# Patient Record
Sex: Female | Born: 1935 | Race: White | Hispanic: No | Smoking: Former smoker
Health system: Southern US, Community
[De-identification: ages and names within clinical notes are randomized; demographics above are authoritative.]

## PROBLEM LIST (undated history)

## (undated) DIAGNOSIS — G629 Polyneuropathy, unspecified: Secondary | ICD-10-CM

## (undated) DIAGNOSIS — C801 Malignant (primary) neoplasm, unspecified: Secondary | ICD-10-CM

## (undated) DIAGNOSIS — E785 Hyperlipidemia, unspecified: Secondary | ICD-10-CM

## (undated) DIAGNOSIS — Z923 Personal history of irradiation: Secondary | ICD-10-CM

## (undated) DIAGNOSIS — G4733 Obstructive sleep apnea (adult) (pediatric): Secondary | ICD-10-CM

## (undated) DIAGNOSIS — J439 Emphysema, unspecified: Secondary | ICD-10-CM

## (undated) DIAGNOSIS — M810 Age-related osteoporosis without current pathological fracture: Secondary | ICD-10-CM

## (undated) DIAGNOSIS — K5900 Constipation, unspecified: Secondary | ICD-10-CM

## (undated) DIAGNOSIS — M1990 Unspecified osteoarthritis, unspecified site: Secondary | ICD-10-CM

## (undated) DIAGNOSIS — G4730 Sleep apnea, unspecified: Secondary | ICD-10-CM

## (undated) DIAGNOSIS — G2581 Restless legs syndrome: Secondary | ICD-10-CM

## (undated) DIAGNOSIS — I4891 Unspecified atrial fibrillation: Secondary | ICD-10-CM

## (undated) DIAGNOSIS — J449 Chronic obstructive pulmonary disease, unspecified: Secondary | ICD-10-CM

## (undated) DIAGNOSIS — K219 Gastro-esophageal reflux disease without esophagitis: Secondary | ICD-10-CM

## (undated) DIAGNOSIS — T7840XA Allergy, unspecified, initial encounter: Secondary | ICD-10-CM

## (undated) DIAGNOSIS — I499 Cardiac arrhythmia, unspecified: Secondary | ICD-10-CM

## (undated) DIAGNOSIS — I272 Pulmonary hypertension, unspecified: Secondary | ICD-10-CM

## (undated) DIAGNOSIS — I10 Essential (primary) hypertension: Secondary | ICD-10-CM

## (undated) HISTORY — DX: Allergy, unspecified, initial encounter: T78.40XA

## (undated) HISTORY — DX: Gastro-esophageal reflux disease without esophagitis: K21.9

## (undated) HISTORY — DX: Hyperlipidemia, unspecified: E78.5

## (undated) HISTORY — DX: Chronic obstructive pulmonary disease, unspecified: J44.9

## (undated) HISTORY — DX: Constipation, unspecified: K59.00

## (undated) HISTORY — DX: Malignant (primary) neoplasm, unspecified: C80.1

## (undated) HISTORY — PX: OTHER SURGICAL HISTORY: SHX169

## (undated) HISTORY — DX: Emphysema, unspecified: J43.9

## (undated) HISTORY — DX: Pulmonary hypertension, unspecified: I27.20

## (undated) HISTORY — DX: Sleep apnea, unspecified: G47.30

## (undated) HISTORY — PX: ABDOMINAL HYSTERECTOMY: SHX81

## (undated) HISTORY — DX: Unspecified atrial fibrillation: I48.91

## (undated) HISTORY — PX: TONSILLECTOMY: SHX5217

## (undated) HISTORY — DX: Restless legs syndrome: G25.81

## (undated) HISTORY — DX: Obstructive sleep apnea (adult) (pediatric): G47.33

## (undated) HISTORY — DX: Polyneuropathy, unspecified: G62.9

## (undated) HISTORY — PX: CATARACT EXTRACTION: SUR2

## (undated) HISTORY — DX: Essential (primary) hypertension: I10

## (undated) HISTORY — DX: Age-related osteoporosis without current pathological fracture: M81.0

## (undated) HISTORY — DX: Cardiac arrhythmia, unspecified: I49.9

## (undated) HISTORY — PX: KNEE ARTHROSCOPY: SUR90

## (undated) HISTORY — PX: COLONOSCOPY: SHX174

## (undated) HISTORY — DX: Unspecified osteoarthritis, unspecified site: M19.90

---

## 2009-02-19 ENCOUNTER — Encounter: Admission: RE | Admit: 2009-02-19 | Discharge: 2009-02-19 | Payer: Self-pay | Admitting: Family Medicine

## 2010-01-29 ENCOUNTER — Encounter: Admission: RE | Admit: 2010-01-29 | Discharge: 2010-01-29 | Payer: Self-pay | Admitting: Emergency Medicine

## 2010-01-31 ENCOUNTER — Encounter: Admission: RE | Admit: 2010-01-31 | Discharge: 2010-01-31 | Payer: Self-pay | Admitting: Emergency Medicine

## 2010-11-24 ENCOUNTER — Encounter: Payer: Self-pay | Admitting: Family Medicine

## 2015-11-13 DIAGNOSIS — R7309 Other abnormal glucose: Secondary | ICD-10-CM | POA: Diagnosis not present

## 2015-11-13 DIAGNOSIS — M545 Low back pain: Secondary | ICD-10-CM | POA: Diagnosis not present

## 2015-11-13 DIAGNOSIS — I10 Essential (primary) hypertension: Secondary | ICD-10-CM | POA: Diagnosis not present

## 2015-11-13 DIAGNOSIS — J449 Chronic obstructive pulmonary disease, unspecified: Secondary | ICD-10-CM | POA: Diagnosis not present

## 2015-11-13 DIAGNOSIS — R0789 Other chest pain: Secondary | ICD-10-CM | POA: Diagnosis not present

## 2015-11-23 DIAGNOSIS — R9431 Abnormal electrocardiogram [ECG] [EKG]: Secondary | ICD-10-CM | POA: Diagnosis not present

## 2015-11-23 DIAGNOSIS — R079 Chest pain, unspecified: Secondary | ICD-10-CM | POA: Diagnosis not present

## 2015-11-26 DIAGNOSIS — I4891 Unspecified atrial fibrillation: Secondary | ICD-10-CM | POA: Diagnosis not present

## 2015-11-26 DIAGNOSIS — Z888 Allergy status to other drugs, medicaments and biological substances status: Secondary | ICD-10-CM | POA: Diagnosis not present

## 2015-11-26 DIAGNOSIS — I480 Paroxysmal atrial fibrillation: Secondary | ICD-10-CM | POA: Diagnosis not present

## 2015-11-26 DIAGNOSIS — J439 Emphysema, unspecified: Secondary | ICD-10-CM | POA: Diagnosis not present

## 2015-11-26 DIAGNOSIS — I209 Angina pectoris, unspecified: Secondary | ICD-10-CM | POA: Diagnosis not present

## 2015-11-26 DIAGNOSIS — R51 Headache: Secondary | ICD-10-CM | POA: Diagnosis not present

## 2015-11-26 DIAGNOSIS — E782 Mixed hyperlipidemia: Secondary | ICD-10-CM | POA: Diagnosis not present

## 2015-11-26 DIAGNOSIS — E871 Hypo-osmolality and hyponatremia: Secondary | ICD-10-CM | POA: Diagnosis not present

## 2015-11-26 DIAGNOSIS — Z79899 Other long term (current) drug therapy: Secondary | ICD-10-CM | POA: Diagnosis not present

## 2015-11-26 DIAGNOSIS — J449 Chronic obstructive pulmonary disease, unspecified: Secondary | ICD-10-CM | POA: Diagnosis not present

## 2015-11-26 DIAGNOSIS — G459 Transient cerebral ischemic attack, unspecified: Secondary | ICD-10-CM | POA: Diagnosis not present

## 2015-11-26 DIAGNOSIS — E785 Hyperlipidemia, unspecified: Secondary | ICD-10-CM | POA: Diagnosis not present

## 2015-11-26 DIAGNOSIS — I10 Essential (primary) hypertension: Secondary | ICD-10-CM | POA: Diagnosis not present

## 2015-11-26 DIAGNOSIS — Z7901 Long term (current) use of anticoagulants: Secondary | ICD-10-CM | POA: Diagnosis not present

## 2015-11-26 DIAGNOSIS — R079 Chest pain, unspecified: Secondary | ICD-10-CM | POA: Diagnosis not present

## 2015-11-27 DIAGNOSIS — G459 Transient cerebral ischemic attack, unspecified: Secondary | ICD-10-CM | POA: Diagnosis not present

## 2015-11-30 DIAGNOSIS — I10 Essential (primary) hypertension: Secondary | ICD-10-CM | POA: Diagnosis not present

## 2015-11-30 DIAGNOSIS — J449 Chronic obstructive pulmonary disease, unspecified: Secondary | ICD-10-CM | POA: Diagnosis not present

## 2015-11-30 DIAGNOSIS — E871 Hypo-osmolality and hyponatremia: Secondary | ICD-10-CM | POA: Diagnosis not present

## 2015-12-24 DIAGNOSIS — J439 Emphysema, unspecified: Secondary | ICD-10-CM | POA: Diagnosis not present

## 2015-12-24 DIAGNOSIS — E782 Mixed hyperlipidemia: Secondary | ICD-10-CM | POA: Diagnosis not present

## 2015-12-24 DIAGNOSIS — I10 Essential (primary) hypertension: Secondary | ICD-10-CM | POA: Diagnosis not present

## 2015-12-24 DIAGNOSIS — I480 Paroxysmal atrial fibrillation: Secondary | ICD-10-CM | POA: Diagnosis not present

## 2015-12-25 DIAGNOSIS — M545 Low back pain: Secondary | ICD-10-CM | POA: Diagnosis not present

## 2015-12-25 DIAGNOSIS — R5383 Other fatigue: Secondary | ICD-10-CM | POA: Diagnosis not present

## 2015-12-25 DIAGNOSIS — J449 Chronic obstructive pulmonary disease, unspecified: Secondary | ICD-10-CM | POA: Diagnosis not present

## 2015-12-25 DIAGNOSIS — I10 Essential (primary) hypertension: Secondary | ICD-10-CM | POA: Diagnosis not present

## 2016-03-11 DIAGNOSIS — J209 Acute bronchitis, unspecified: Secondary | ICD-10-CM | POA: Diagnosis not present

## 2016-03-17 DIAGNOSIS — J449 Chronic obstructive pulmonary disease, unspecified: Secondary | ICD-10-CM | POA: Diagnosis not present

## 2016-03-17 DIAGNOSIS — I10 Essential (primary) hypertension: Secondary | ICD-10-CM | POA: Diagnosis not present

## 2016-04-03 DIAGNOSIS — I10 Essential (primary) hypertension: Secondary | ICD-10-CM | POA: Diagnosis not present

## 2016-04-03 DIAGNOSIS — J449 Chronic obstructive pulmonary disease, unspecified: Secondary | ICD-10-CM | POA: Diagnosis not present

## 2016-04-03 DIAGNOSIS — Z0000 Encounter for general adult medical examination without abnormal findings: Secondary | ICD-10-CM | POA: Diagnosis not present

## 2016-04-03 DIAGNOSIS — R5383 Other fatigue: Secondary | ICD-10-CM | POA: Diagnosis not present

## 2016-04-03 DIAGNOSIS — E782 Mixed hyperlipidemia: Secondary | ICD-10-CM | POA: Diagnosis not present

## 2016-04-03 DIAGNOSIS — M810 Age-related osteoporosis without current pathological fracture: Secondary | ICD-10-CM | POA: Diagnosis not present

## 2016-04-07 DIAGNOSIS — M545 Low back pain: Secondary | ICD-10-CM | POA: Diagnosis not present

## 2016-04-07 DIAGNOSIS — M8580 Other specified disorders of bone density and structure, unspecified site: Secondary | ICD-10-CM | POA: Diagnosis not present

## 2016-04-07 DIAGNOSIS — Z0000 Encounter for general adult medical examination without abnormal findings: Secondary | ICD-10-CM | POA: Diagnosis not present

## 2016-04-07 DIAGNOSIS — J449 Chronic obstructive pulmonary disease, unspecified: Secondary | ICD-10-CM | POA: Diagnosis not present

## 2016-04-07 DIAGNOSIS — E7800 Pure hypercholesterolemia, unspecified: Secondary | ICD-10-CM | POA: Diagnosis not present

## 2016-04-07 DIAGNOSIS — H6120 Impacted cerumen, unspecified ear: Secondary | ICD-10-CM | POA: Diagnosis not present

## 2016-04-07 DIAGNOSIS — I10 Essential (primary) hypertension: Secondary | ICD-10-CM | POA: Diagnosis not present

## 2016-04-21 DIAGNOSIS — M85851 Other specified disorders of bone density and structure, right thigh: Secondary | ICD-10-CM | POA: Diagnosis not present

## 2016-04-21 DIAGNOSIS — M8580 Other specified disorders of bone density and structure, unspecified site: Secondary | ICD-10-CM | POA: Diagnosis not present

## 2016-04-21 DIAGNOSIS — Z1231 Encounter for screening mammogram for malignant neoplasm of breast: Secondary | ICD-10-CM | POA: Diagnosis not present

## 2016-04-21 DIAGNOSIS — Z13820 Encounter for screening for osteoporosis: Secondary | ICD-10-CM | POA: Diagnosis not present

## 2016-04-21 DIAGNOSIS — M8588 Other specified disorders of bone density and structure, other site: Secondary | ICD-10-CM | POA: Diagnosis not present

## 2016-05-01 DIAGNOSIS — I10 Essential (primary) hypertension: Secondary | ICD-10-CM | POA: Diagnosis not present

## 2016-05-01 DIAGNOSIS — M542 Cervicalgia: Secondary | ICD-10-CM | POA: Diagnosis not present

## 2016-05-01 DIAGNOSIS — R0789 Other chest pain: Secondary | ICD-10-CM | POA: Diagnosis not present

## 2016-06-02 DIAGNOSIS — R002 Palpitations: Secondary | ICD-10-CM | POA: Diagnosis not present

## 2016-06-02 DIAGNOSIS — I10 Essential (primary) hypertension: Secondary | ICD-10-CM | POA: Diagnosis not present

## 2016-06-09 DIAGNOSIS — I480 Paroxysmal atrial fibrillation: Secondary | ICD-10-CM | POA: Diagnosis not present

## 2016-06-09 DIAGNOSIS — E782 Mixed hyperlipidemia: Secondary | ICD-10-CM | POA: Diagnosis not present

## 2016-06-09 DIAGNOSIS — I10 Essential (primary) hypertension: Secondary | ICD-10-CM | POA: Diagnosis not present

## 2016-06-09 DIAGNOSIS — Q833 Accessory nipple: Secondary | ICD-10-CM | POA: Diagnosis not present

## 2016-06-09 DIAGNOSIS — I4891 Unspecified atrial fibrillation: Secondary | ICD-10-CM | POA: Diagnosis not present

## 2016-06-09 DIAGNOSIS — L720 Epidermal cyst: Secondary | ICD-10-CM | POA: Diagnosis not present

## 2016-06-09 DIAGNOSIS — D234 Other benign neoplasm of skin of scalp and neck: Secondary | ICD-10-CM | POA: Diagnosis not present

## 2016-06-09 DIAGNOSIS — J449 Chronic obstructive pulmonary disease, unspecified: Secondary | ICD-10-CM | POA: Diagnosis not present

## 2016-06-09 DIAGNOSIS — L820 Inflamed seborrheic keratosis: Secondary | ICD-10-CM | POA: Diagnosis not present

## 2016-07-26 DIAGNOSIS — Z23 Encounter for immunization: Secondary | ICD-10-CM | POA: Diagnosis not present

## 2016-08-18 DIAGNOSIS — I10 Essential (primary) hypertension: Secondary | ICD-10-CM | POA: Diagnosis not present

## 2016-08-18 DIAGNOSIS — R002 Palpitations: Secondary | ICD-10-CM | POA: Diagnosis not present

## 2016-08-18 DIAGNOSIS — L659 Nonscarring hair loss, unspecified: Secondary | ICD-10-CM | POA: Diagnosis not present

## 2016-08-18 DIAGNOSIS — J449 Chronic obstructive pulmonary disease, unspecified: Secondary | ICD-10-CM | POA: Diagnosis not present

## 2016-08-18 DIAGNOSIS — G4700 Insomnia, unspecified: Secondary | ICD-10-CM | POA: Diagnosis not present

## 2016-09-04 DIAGNOSIS — I10 Essential (primary) hypertension: Secondary | ICD-10-CM | POA: Diagnosis not present

## 2016-09-04 DIAGNOSIS — Z8679 Personal history of other diseases of the circulatory system: Secondary | ICD-10-CM | POA: Diagnosis not present

## 2016-09-04 DIAGNOSIS — R002 Palpitations: Secondary | ICD-10-CM | POA: Diagnosis not present

## 2016-09-11 DIAGNOSIS — M545 Low back pain: Secondary | ICD-10-CM | POA: Diagnosis not present

## 2016-09-11 DIAGNOSIS — J449 Chronic obstructive pulmonary disease, unspecified: Secondary | ICD-10-CM | POA: Diagnosis not present

## 2016-09-11 DIAGNOSIS — R002 Palpitations: Secondary | ICD-10-CM | POA: Diagnosis not present

## 2016-09-11 DIAGNOSIS — G4700 Insomnia, unspecified: Secondary | ICD-10-CM | POA: Diagnosis not present

## 2016-09-11 DIAGNOSIS — R05 Cough: Secondary | ICD-10-CM | POA: Diagnosis not present

## 2016-09-11 DIAGNOSIS — I10 Essential (primary) hypertension: Secondary | ICD-10-CM | POA: Diagnosis not present

## 2016-09-16 DIAGNOSIS — H43813 Vitreous degeneration, bilateral: Secondary | ICD-10-CM | POA: Diagnosis not present

## 2016-09-16 DIAGNOSIS — H52223 Regular astigmatism, bilateral: Secondary | ICD-10-CM | POA: Diagnosis not present

## 2016-09-16 DIAGNOSIS — Z961 Presence of intraocular lens: Secondary | ICD-10-CM | POA: Diagnosis not present

## 2016-09-16 DIAGNOSIS — G43109 Migraine with aura, not intractable, without status migrainosus: Secondary | ICD-10-CM | POA: Diagnosis not present

## 2016-10-03 DIAGNOSIS — M545 Low back pain: Secondary | ICD-10-CM | POA: Diagnosis not present

## 2016-10-03 DIAGNOSIS — R002 Palpitations: Secondary | ICD-10-CM | POA: Diagnosis not present

## 2016-10-03 DIAGNOSIS — I10 Essential (primary) hypertension: Secondary | ICD-10-CM | POA: Diagnosis not present

## 2016-10-03 DIAGNOSIS — J449 Chronic obstructive pulmonary disease, unspecified: Secondary | ICD-10-CM | POA: Diagnosis not present

## 2016-11-10 DIAGNOSIS — R002 Palpitations: Secondary | ICD-10-CM | POA: Diagnosis not present

## 2016-11-10 DIAGNOSIS — J449 Chronic obstructive pulmonary disease, unspecified: Secondary | ICD-10-CM | POA: Diagnosis not present

## 2016-11-10 DIAGNOSIS — R7309 Other abnormal glucose: Secondary | ICD-10-CM | POA: Diagnosis not present

## 2016-11-10 DIAGNOSIS — I10 Essential (primary) hypertension: Secondary | ICD-10-CM | POA: Diagnosis not present

## 2016-12-08 DIAGNOSIS — J439 Emphysema, unspecified: Secondary | ICD-10-CM | POA: Diagnosis not present

## 2016-12-08 DIAGNOSIS — I480 Paroxysmal atrial fibrillation: Secondary | ICD-10-CM | POA: Diagnosis not present

## 2016-12-08 DIAGNOSIS — E782 Mixed hyperlipidemia: Secondary | ICD-10-CM | POA: Diagnosis not present

## 2016-12-08 DIAGNOSIS — I10 Essential (primary) hypertension: Secondary | ICD-10-CM | POA: Diagnosis not present

## 2016-12-26 DIAGNOSIS — H903 Sensorineural hearing loss, bilateral: Secondary | ICD-10-CM | POA: Diagnosis not present

## 2016-12-26 DIAGNOSIS — Z6822 Body mass index (BMI) 22.0-22.9, adult: Secondary | ICD-10-CM | POA: Diagnosis not present

## 2017-01-19 DIAGNOSIS — R002 Palpitations: Secondary | ICD-10-CM | POA: Diagnosis not present

## 2017-01-19 DIAGNOSIS — R1013 Epigastric pain: Secondary | ICD-10-CM | POA: Diagnosis not present

## 2017-01-19 DIAGNOSIS — I10 Essential (primary) hypertension: Secondary | ICD-10-CM | POA: Diagnosis not present

## 2017-01-19 DIAGNOSIS — K5900 Constipation, unspecified: Secondary | ICD-10-CM | POA: Diagnosis not present

## 2017-02-05 DIAGNOSIS — K5900 Constipation, unspecified: Secondary | ICD-10-CM | POA: Diagnosis not present

## 2017-02-05 DIAGNOSIS — K458 Other specified abdominal hernia without obstruction or gangrene: Secondary | ICD-10-CM | POA: Diagnosis not present

## 2017-02-05 DIAGNOSIS — I10 Essential (primary) hypertension: Secondary | ICD-10-CM | POA: Diagnosis not present

## 2017-02-11 DIAGNOSIS — K439 Ventral hernia without obstruction or gangrene: Secondary | ICD-10-CM | POA: Diagnosis not present

## 2017-02-11 DIAGNOSIS — I480 Paroxysmal atrial fibrillation: Secondary | ICD-10-CM | POA: Diagnosis not present

## 2017-02-11 DIAGNOSIS — J439 Emphysema, unspecified: Secondary | ICD-10-CM | POA: Diagnosis not present

## 2017-02-11 DIAGNOSIS — I10 Essential (primary) hypertension: Secondary | ICD-10-CM | POA: Diagnosis not present

## 2017-03-04 DIAGNOSIS — K469 Unspecified abdominal hernia without obstruction or gangrene: Secondary | ICD-10-CM | POA: Diagnosis not present

## 2017-03-04 DIAGNOSIS — Z8679 Personal history of other diseases of the circulatory system: Secondary | ICD-10-CM | POA: Diagnosis not present

## 2017-03-04 DIAGNOSIS — I10 Essential (primary) hypertension: Secondary | ICD-10-CM | POA: Diagnosis not present

## 2017-03-12 DIAGNOSIS — K5730 Diverticulosis of large intestine without perforation or abscess without bleeding: Secondary | ICD-10-CM | POA: Diagnosis not present

## 2017-03-12 DIAGNOSIS — M5186 Other intervertebral disc disorders, lumbar region: Secondary | ICD-10-CM | POA: Diagnosis not present

## 2017-03-12 DIAGNOSIS — M47815 Spondylosis without myelopathy or radiculopathy, thoracolumbar region: Secondary | ICD-10-CM | POA: Diagnosis not present

## 2017-03-12 DIAGNOSIS — K439 Ventral hernia without obstruction or gangrene: Secondary | ICD-10-CM | POA: Diagnosis not present

## 2017-03-12 DIAGNOSIS — M5135 Other intervertebral disc degeneration, thoracolumbar region: Secondary | ICD-10-CM | POA: Diagnosis not present

## 2017-03-23 DIAGNOSIS — I10 Essential (primary) hypertension: Secondary | ICD-10-CM | POA: Diagnosis not present

## 2017-03-23 DIAGNOSIS — M542 Cervicalgia: Secondary | ICD-10-CM | POA: Diagnosis not present

## 2017-03-23 DIAGNOSIS — K439 Ventral hernia without obstruction or gangrene: Secondary | ICD-10-CM | POA: Diagnosis not present

## 2017-04-09 DIAGNOSIS — R7309 Other abnormal glucose: Secondary | ICD-10-CM | POA: Diagnosis not present

## 2017-04-09 DIAGNOSIS — J449 Chronic obstructive pulmonary disease, unspecified: Secondary | ICD-10-CM | POA: Diagnosis not present

## 2017-04-09 DIAGNOSIS — I10 Essential (primary) hypertension: Secondary | ICD-10-CM | POA: Diagnosis not present

## 2017-04-09 DIAGNOSIS — E7800 Pure hypercholesterolemia, unspecified: Secondary | ICD-10-CM | POA: Diagnosis not present

## 2017-04-09 DIAGNOSIS — R5383 Other fatigue: Secondary | ICD-10-CM | POA: Diagnosis not present

## 2017-04-09 DIAGNOSIS — I4892 Unspecified atrial flutter: Secondary | ICD-10-CM | POA: Diagnosis not present

## 2017-04-09 DIAGNOSIS — R7989 Other specified abnormal findings of blood chemistry: Secondary | ICD-10-CM | POA: Diagnosis not present

## 2017-04-09 DIAGNOSIS — M8580 Other specified disorders of bone density and structure, unspecified site: Secondary | ICD-10-CM | POA: Diagnosis not present

## 2017-04-09 DIAGNOSIS — Z0000 Encounter for general adult medical examination without abnormal findings: Secondary | ICD-10-CM | POA: Diagnosis not present

## 2017-06-08 DIAGNOSIS — I10 Essential (primary) hypertension: Secondary | ICD-10-CM | POA: Diagnosis not present

## 2017-06-08 DIAGNOSIS — E782 Mixed hyperlipidemia: Secondary | ICD-10-CM | POA: Diagnosis not present

## 2017-06-08 DIAGNOSIS — R002 Palpitations: Secondary | ICD-10-CM | POA: Diagnosis not present

## 2017-06-08 DIAGNOSIS — I480 Paroxysmal atrial fibrillation: Secondary | ICD-10-CM | POA: Diagnosis not present

## 2017-06-08 DIAGNOSIS — J439 Emphysema, unspecified: Secondary | ICD-10-CM | POA: Diagnosis not present

## 2017-06-15 DIAGNOSIS — I10 Essential (primary) hypertension: Secondary | ICD-10-CM | POA: Diagnosis not present

## 2017-06-15 DIAGNOSIS — J449 Chronic obstructive pulmonary disease, unspecified: Secondary | ICD-10-CM | POA: Diagnosis not present

## 2017-06-15 DIAGNOSIS — R6889 Other general symptoms and signs: Secondary | ICD-10-CM | POA: Diagnosis not present

## 2017-06-15 DIAGNOSIS — G4700 Insomnia, unspecified: Secondary | ICD-10-CM | POA: Diagnosis not present

## 2017-07-09 DIAGNOSIS — J449 Chronic obstructive pulmonary disease, unspecified: Secondary | ICD-10-CM | POA: Diagnosis not present

## 2017-07-09 DIAGNOSIS — I10 Essential (primary) hypertension: Secondary | ICD-10-CM | POA: Diagnosis not present

## 2017-07-09 DIAGNOSIS — R42 Dizziness and giddiness: Secondary | ICD-10-CM | POA: Diagnosis not present

## 2017-07-21 ENCOUNTER — Ambulatory Visit (INDEPENDENT_AMBULATORY_CARE_PROVIDER_SITE_OTHER): Payer: Medicare Other | Admitting: Emergency Medicine

## 2017-07-21 ENCOUNTER — Encounter: Payer: Self-pay | Admitting: *Deleted

## 2017-07-21 ENCOUNTER — Other Ambulatory Visit: Payer: Self-pay | Admitting: *Deleted

## 2017-07-21 DIAGNOSIS — R05 Cough: Secondary | ICD-10-CM | POA: Diagnosis not present

## 2017-07-21 DIAGNOSIS — J449 Chronic obstructive pulmonary disease, unspecified: Secondary | ICD-10-CM | POA: Diagnosis not present

## 2017-07-21 DIAGNOSIS — R059 Cough, unspecified: Secondary | ICD-10-CM

## 2017-07-21 DIAGNOSIS — J441 Chronic obstructive pulmonary disease with (acute) exacerbation: Secondary | ICD-10-CM | POA: Insufficient documentation

## 2017-07-21 MED ORDER — TIOTROPIUM BROMIDE-OLODATEROL 2.5-2.5 MCG/ACT IN AERS: 2.0000 | INHALATION_SPRAY | Freq: Every day | RESPIRATORY_TRACT | 0 refills | Status: DC

## 2017-07-21 NOTE — Assessment & Plan Note (Signed)
With minimal rhinitis sx, no significant GERD. She wonders if her Symbicort is a contributor, certainly possible. We will try to change to an alternative see if cough benefits. Also tells me that her valsartan was changed to losartan, ? Whether this could be a factor. She is going to try to get back on valsartan as it controlled her BP better, caused less edema.

## 2017-07-21 NOTE — Assessment & Plan Note (Signed)
We will temporarily stop Symbicort and Spiriva Start Stiolto 2 puffs once a day for the next month. If your cough and / or breathing improve then we will call to your pharmacy. Call us to let us know.  Walking oximetry on room air today  We will obtain copies of your recent PFT's from [facility] Follow with Dr Lamonte Sakai in 1 month or next available.

## 2017-07-21 NOTE — Patient Instructions (Addendum)
We will temporarily stop Symbicort and Spiriva Start Stiolto 2 puffs once a day for the next month. If your cough and / or breathing improve then we will call to your pharmacy. Call us to let us know.  Walking oximetry on room air today  We will obtain copies of your recent PFT's from [facility] Follow with Dr Lamonte Sakai in 1 month or next available.

## 2017-07-21 NOTE — Progress Notes (Signed)
Subjective:    Patient ID: Holly Benson, female    DOB: 1933/08/20, 83 y.o.   MRN: [id number]  HPI 83 year old woman former smoker (45 pack year), with hx of hypertension, atrial fibrillation COPD made 4-5 years ago. She has just moved to [facility], is referred for management of COPD. She had PFT in 04/2017 in [place]. She also has exertional dyspnea with hills, stairs, sometimes has to stop to rest. She is able to do her home chores. No significant wheeze. He only exacerbation was with a URI 2 yrs ago. Her biggest complaint is cough, has been bothering her for ~18 months. She occasionally has nasal congestion, seems to be better than years past. She has GERD rarely.    Review of Systems  Constitutional: Negative for fever and unexpected weight change.  HENT: Negative for congestion, dental problem, ear pain, nosebleeds, postnasal drip, rhinorrhea, sinus pressure, sneezing, sore throat and trouble swallowing.   Eyes: Negative for redness and itching.  Respiratory: Positive for cough. Negative for chest tightness, shortness of breath and wheezing.   Cardiovascular: Negative for palpitations and leg swelling.  Gastrointestinal: Negative for nausea and vomiting.  Genitourinary: Negative for dysuria.  Musculoskeletal: Negative for joint swelling.  Skin: Negative for rash.  Neurological: Negative for headaches.  Hematological: Does not bruise/bleed easily.  Psychiatric/Behavioral: Negative for dysphoric mood. The patient is not nervous/anxious.      Past Medical History:  Diagnosis Date  . Atrial fibrillation ([place])   . COPD (chronic obstructive pulmonary disease) ([place])   . Hyperlipidemia   . Hypertension      No family history on file.   Social History   Social History  . Marital status: Single    Spouse name: N/A  . Number of children: N/A  . Years of education: N/A   Occupational History  . Not on file.   Social History Main Topics  . Smoking status: Former Smoker   Packs/day: 1.50    Years: 30.00    Quit date: 11/04/1991  . Smokeless tobacco: Never Used  . Alcohol use Not on file  . Drug use: No  . Sexual activity: Not on file   Other Topics Concern  . Not on file   Social History Narrative  . 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  No known TB  Allergies  Allergen Reactions  . Amlodipine     Muscle pain  . Metoprolol     Muscle aches     Outpatient Medications Prior to Visit  Medication Sig Dispense Refill  . apixaban (ELIQUIS) 5 MG TABS tablet Take 5 mg by mouth daily.    . budesonide-formoterol (SYMBICORT) 80-4.5 MCG/ACT inhaler Inhale 2 puffs into the lungs 2 (two) times daily.    Marland Kitchen diltiazem (CARDIZEM CD) 240 MG 24 hr capsule Take 240 mg by mouth daily.    Marland Kitchen losartan (COZAAR) 100 MG tablet Take 100 mg by mouth daily.    . pravastatin (PRAVACHOL) 40 MG tablet Take 40 mg by mouth daily.    Marland Kitchen SPIRIVA RESPIMAT 2.5 MCG/ACT AERS Inhale 2 puffs into the lungs daily.     No facility-administered medications prior to visit.         Objective:   Physical Exam Vitals:   07/21/17 1621  BP: 136/70  Pulse: 86  SpO2: 98%   Gen: Pleasant, thin woman, in no distress,  normal affect, mild kyphosis  ENT: No lesions,  mouth clear,  oropharynx clear, no postnasal drip  Neck: No JVD, no stridor  Lungs: No use of accessory muscles, clear bilaterally, no wheeze on forced exp  Cardiovascular: RRR, heart sounds normal, no murmur or gallops, no peripheral edema  Musculoskeletal: No deformities, no cyanosis or clubbing  Neuro: alert, non focal  Skin: Warm, no lesions or rashes      Assessment & Plan:  Cough With minimal rhinitis sx, no significant GERD. She wonders if her Symbicort is a contributor, certainly possible. We will try to change to an alternative see if cough benefits. Also tells me that her valsartan was changed to losartan, ? Whether this could be a factor. She is going  to try to get back on valsartan as it controlled her BP better, caused less edema.   COPD (chronic obstructive pulmonary disease) (HCC) We will temporarily stop Symbicort and Spiriva Start Stiolto 2 puffs once a day for the next month. If your cough and / or breathing improve then we will call to your pharmacy. Call us to let us know.  Walking oximetry on room air today  We will obtain copies of your recent PFT's from [facility] Follow with Dr Lamonte Sakai in 1 month or next available.   Baltazar Apo, MD, PhD 07/21/2017, 5:09 PM [facility] [phone] or if no answer [phone]

## 2017-07-24 DIAGNOSIS — R7989 Other specified abnormal findings of blood chemistry: Secondary | ICD-10-CM | POA: Diagnosis not present

## 2017-07-24 DIAGNOSIS — Z23 Encounter for immunization: Secondary | ICD-10-CM | POA: Diagnosis not present

## 2017-08-19 DIAGNOSIS — K5900 Constipation, unspecified: Secondary | ICD-10-CM | POA: Diagnosis not present

## 2017-08-19 DIAGNOSIS — R609 Edema, unspecified: Secondary | ICD-10-CM | POA: Diagnosis not present

## 2017-08-19 DIAGNOSIS — Z8679 Personal history of other diseases of the circulatory system: Secondary | ICD-10-CM | POA: Diagnosis not present

## 2017-08-20 ENCOUNTER — Encounter: Payer: Self-pay | Admitting: Emergency Medicine

## 2017-08-20 ENCOUNTER — Ambulatory Visit (INDEPENDENT_AMBULATORY_CARE_PROVIDER_SITE_OTHER): Payer: Medicare Other | Admitting: Emergency Medicine

## 2017-08-20 DIAGNOSIS — J449 Chronic obstructive pulmonary disease, unspecified: Secondary | ICD-10-CM | POA: Diagnosis not present

## 2017-08-20 DIAGNOSIS — R059 Cough, unspecified: Secondary | ICD-10-CM

## 2017-08-20 DIAGNOSIS — R05 Cough: Secondary | ICD-10-CM

## 2017-08-20 MED ORDER — PANTOPRAZOLE SODIUM 40 MG PO TBEC
40.0000 mg | DELAYED_RELEASE_TABLET | Freq: Every day | ORAL | 0 refills | Status: DC
Start: 2017-08-20 — End: 2017-09-10

## 2017-08-20 MED ORDER — TIOTROPIUM BROMIDE-OLODATEROL 2.5-2.5 MCG/ACT IN AERS: 2.0000 | INHALATION_SPRAY | Freq: Every day | RESPIRATORY_TRACT | 3 refills | Status: DC

## 2017-08-20 NOTE — Patient Instructions (Addendum)
We will continue Stiolto 2 puffs once a day, write a prescription for this.  We will do a trial of pantoprazole 40mg  once a day for the next month. Take this one hour before eating. If this impacts your throat irritation and cough then we can consider staying on this medication. Call us and let us know how you do.  Use albuterol 2 puffs as needed for shortness of breath Flu shot up to date.  Follow with Dr Lamonte Sakai in 6 months or sooner if you have any problems

## 2017-08-20 NOTE — Assessment & Plan Note (Signed)
We will continue Stiolto 2 puffs once a day, write a prescription for this.  We will do a trial of pantoprazole 40mg  once a day for the next month. Take this one hour before eating. If this impacts your throat irritation and cough then we can consider staying on this medication. Call us and let us know how you do.  Use albuterol 2 puffs as needed for shortness of breath Flu shot up to date.  Follow with Dr Lamonte Sakai in 6 months or sooner if you have any problems

## 2017-08-20 NOTE — Progress Notes (Signed)
Subjective:    Patient ID: Holly Benson, female    DOB: December 13, 1932, 83 y.o.   MRN: [id number]  HPI 83 year old woman former smoker (45 pack year), with hx of hypertension, atrial fibrillation COPD made 4-5 years ago. She has just moved to [facility], is referred for management of COPD. She had PFT in 04/2017 in [place]. She also has exertional dyspnea with hills, stairs, sometimes has to stop to rest. She is able to do her home chores. No significant wheeze. He only exacerbation was with a URI 2 yrs ago. Her biggest complaint is cough, has been bothering her for ~18 months. She occasionally has nasal congestion, seems to be better than years past. She has GERD rarely.   ROV 08/20/17 -- This is a follow-up visit for evaluation of COPD and chronic cough. At her initial visit we did a trial of Stiolto, substituted this for Symbicort and Spiriva. She believes that she is doing about the same. Her cough may be slightly better. Still present. Occasional GERD.    Review of Systems  Constitutional: Negative for fever and unexpected weight change.  HENT: Negative for congestion, dental problem, ear pain, nosebleeds, postnasal drip, rhinorrhea, sinus pressure, sneezing, sore throat and trouble swallowing.   Eyes: Negative for redness and itching.  Respiratory: Positive for cough. Negative for chest tightness, shortness of breath and wheezing.   Cardiovascular: Negative for palpitations and leg swelling.  Gastrointestinal: Negative for nausea and vomiting.  Genitourinary: Negative for dysuria.  Musculoskeletal: Negative for joint swelling.  Skin: Negative for rash.  Neurological: Negative for headaches.  Hematological: Does not bruise/bleed easily.  Psychiatric/Behavioral: Negative for dysphoric mood. The patient is not nervous/anxious.      Past Medical History:  Diagnosis Date  . Atrial fibrillation ([place])   . COPD (chronic obstructive pulmonary disease) ([place])   . Hyperlipidemia   . Hypertension      No family history on file.   Social History   Social History  . Marital status: Single    Spouse name: N/A  . Number of children: N/A  . Years of education: N/A   Occupational History  . Not on file.   Social History Main Topics  . Smoking status: Former Smoker    Packs/day: 1.50    Years: 30.00    Quit date: 11/04/1991  . Smokeless tobacco: Never Used  . Alcohol use Not on file  . Drug use: No  . Sexual activity: Not on file   Other Topics Concern  . Not on file   Social History Narrative  . 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  No known TB  Allergies  Allergen Reactions  . Amlodipine     Muscle pain  . Metoprolol     Muscle aches     Outpatient Medications Prior to Visit  Medication Sig Dispense Refill  . diltiazem (CARDIZEM CD) 240 MG 24 hr capsule Take 240 mg by mouth daily.    Marland Kitchen losartan (COZAAR) 100 MG tablet Take 100 mg by mouth daily.    . pravastatin (PRAVACHOL) 40 MG tablet Take 40 mg by mouth daily.    . Tiotropium Bromide-Olodaterol (STIOLTO RESPIMAT) 2.5-2.5 MCG/ACT AERS Inhale 2 puffs into the lungs daily. 2 Inhaler 0  . apixaban (ELIQUIS) 5 MG TABS tablet Take 5 mg by mouth daily.    . budesonide-formoterol (SYMBICORT) 80-4.5 MCG/ACT inhaler Inhale 2 puffs into the lungs 2 (two) times daily.    Marland Kitchen  SPIRIVA RESPIMAT 2.5 MCG/ACT AERS Inhale 2 puffs into the lungs daily.     No facility-administered medications prior to visit.         Objective:   Physical Exam Vitals:   08/20/17 0927 08/20/17 0928  BP:  124/82  Pulse:  78  SpO2:  98%  Weight: 133 lb (60.3 kg)   Height: 5\' 3"  (1.6 m)    Gen: Pleasant, thin woman, in no distress,  normal affect, mild kyphosis  ENT: No lesions,  mouth clear,  oropharynx clear, no postnasal drip  Neck: No JVD, no stridor  Lungs: No use of accessory muscles, clear bilaterally, no wheeze on forced exp  Cardiovascular: RRR, heart sounds normal, no  murmur or gallops, no peripheral edema  Musculoskeletal: No deformities, no cyanosis or clubbing  Neuro: alert, non focal  Skin: Warm, no lesions or rashes      Assessment & Plan:  COPD (chronic obstructive pulmonary disease) (HCC) We will continue Stiolto 2 puffs once a day, write a prescription for this.  We will do a trial of pantoprazole 40mg  once a day for the next month. Take this one hour before eating. If this impacts your throat irritation and cough then we can consider staying on this medication. Call us and let us know how you do.  Use albuterol 2 puffs as needed for shortness of breath Flu shot up to date.  Follow with Dr Lamonte Sakai in 6 months or sooner if you have any problems  Cough Question impact of GERD. She also has some subacute allergic rhinitis that is not currently treated.  We will do a trial of pantoprazole 40mg  once a day for the next month. Take this one hour before eating. If this impacts your throat irritation and cough then we can consider staying on this medication. Call us and let us know how you do.   Baltazar Apo, MD, PhD 08/20/2017, 9:51 AM [facility] [phone] or if no answer [phone]

## 2017-08-20 NOTE — Assessment & Plan Note (Signed)
Question impact of GERD. She also has some subacute allergic rhinitis that is not currently treated.  We will do a trial of pantoprazole 40mg  once a day for the next month. Take this one hour before eating. If this impacts your throat irritation and cough then we can consider staying on this medication. Call us and let us know how you do.

## 2017-09-10 ENCOUNTER — Ambulatory Visit (INDEPENDENT_AMBULATORY_CARE_PROVIDER_SITE_OTHER): Payer: Medicare Other | Admitting: Internal Medicine

## 2017-09-10 ENCOUNTER — Encounter: Payer: Self-pay | Admitting: Internal Medicine

## 2017-09-10 VITALS — BP 142/68 | HR 78 | Ht 63.0 in | Wt 138.0 lb

## 2017-09-10 DIAGNOSIS — I480 Paroxysmal atrial fibrillation: Secondary | ICD-10-CM | POA: Insufficient documentation

## 2017-09-10 DIAGNOSIS — E782 Mixed hyperlipidemia: Secondary | ICD-10-CM

## 2017-09-10 DIAGNOSIS — Z79899 Other long term (current) drug therapy: Secondary | ICD-10-CM

## 2017-09-10 DIAGNOSIS — R002 Palpitations: Secondary | ICD-10-CM

## 2017-09-10 DIAGNOSIS — I10 Essential (primary) hypertension: Secondary | ICD-10-CM

## 2017-09-10 DIAGNOSIS — I4891 Unspecified atrial fibrillation: Secondary | ICD-10-CM

## 2017-09-10 MED ORDER — HYDROCHLOROTHIAZIDE 12.5 MG PO CAPS: 12.5000 mg | ORAL_CAPSULE | Freq: Every day | ORAL | 3 refills | Status: DC

## 2017-09-10 NOTE — Progress Notes (Signed)
OFFICE NOTE  Chief Complaint:  Establish cardiologist  Primary Care Physician: London Pepper, MD  HPI:  Holly Benson is a 83 y.o. female with a past medial history significant for atrial fibrillation, COPD, hypertension and dyslipidemia.  She is originally from [place] but has been living in [place].  She was established and treated by Dr. Gloriann Loan, a cardiac electrophysiologist with [facility].  She underwent electrical cardioversion and was offered an A. fib ablation, but preferred medical therapy.  She has been on diltiazem and Eliquis as well as losartan for hypertension.  She also takes pravastatin and Spiriva for her COPD.  She denies any chest pain or worsening shortness of breath.  She reports she has had a stress test and an echo in the past, neither which were remarkable.  We will try to obtain those records from her prior cardiologist.  Recently she has noted some increase in palpitations and feeling like her heart is pounding.  This is mostly at night when she is trying to go to bed.  She says it can happen several times a week, but it certainly not daily.  PMHx:  Past Medical History:  Diagnosis Date  . Atrial fibrillation ([place])   . COPD (chronic obstructive pulmonary disease) ([place])   . Hyperlipidemia   . Hypertension     History reviewed. No pertinent surgical history.  FAMHx:  Family History  Problem Relation Age of Onset  . Suicidality Father   . Stroke Maternal Grandfather     SOCHx:   reports that she quit smoking about 25 years ago. She has a 45.00 pack-year smoking history. she has never used smokeless tobacco. She reports that she does not use drugs. Her alcohol history is not on file.  ALLERGIES:  Allergies  Allergen Reactions  . Lovenox [Enoxaparin Sodium] Hives  . Amlodipine     Muscle pain  . Metoprolol     Muscle aches    ROS: Pertinent items noted in HPI and remainder of comprehensive ROS otherwise negative.  HOME  MEDS: Current Outpatient Medications on File Prior to Visit  Medication Sig Dispense Refill  . apixaban (ELIQUIS) 5 MG TABS tablet Take 5 mg by mouth 2 (two) times daily.    Marland Kitchen diltiazem (CARDIZEM CD) 240 MG 24 hr capsule Take 240 mg by mouth daily.    Marland Kitchen losartan (COZAAR) 100 MG tablet Take 100 mg by mouth daily.    . pravastatin (PRAVACHOL) 40 MG tablet Take 40 mg by mouth daily.    . Tiotropium Bromide-Olodaterol (STIOLTO RESPIMAT) 2.5-2.5 MCG/ACT AERS Inhale 2 puffs into the lungs daily. 3 Inhaler 3   No current facility-administered medications on file prior to visit.     LABS/IMAGING: No results found for this or any previous visit (from the past 48 hour(s)). No results found.  LIPID PANEL: No results found for: CHOL, TRIG, HDL, CHOLHDL, VLDL, LDLCALC, LDLDIRECT   WEIGHTS: Wt Readings from Last 3 Encounters:  09/10/17 138 lb (62.6 kg)  08/20/17 133 lb (60.3 kg)  07/21/17 132 lb (59.9 kg)    VITALS: BP (!) 142/68   Pulse 78   Ht 5\' 3"  (1.6 m)   Wt 138 lb (62.6 kg)   BMI 24.45 kg/m   EXAM: General appearance: alert and no distress Neck: no carotid bruit, no JVD and thyroid not enlarged, symmetric, no tenderness/mass/nodules Lungs: clear to auscultation bilaterally Heart: regular rate and rhythm Abdomen: soft, non-tender; bowel sounds normal; no masses,  no organomegaly Extremities:  extremities normal, atraumatic, no cyanosis or edema Pulses: 2+ and symmetric Skin: Skin color, texture, turgor normal. No rashes or lesions Neurologic: Grossly normal Psych: Pleasant  EKG: Normal sinus rhythm, left axis deviation at 78-personally reviewed- personally reviewed  ASSESSMENT: 1. History of atrial fibrillation, status post DCCV 2. Palpitations 3. CHADSVASC score of 4 - on Eliquis 4. Hypertension  5. Dyslipidemia  PLAN: 1.   [name] is on appropriate medications for her atrial fibrillation.  Recently she has been reporting some hard heartbeats at night,  suggestive of palpitations but is not clear if this is recurrent A. fib or perhaps PACs or PVCs.  I like to place a 2-week monitor to see if we can pick up on these.  We will also obtain records from her cardiologist previously at [facility].  Thanks for the kind referral to establish cardiovascular care.  Will be in contact when we receive additional records.  Pixie Casino, MD, [place]  Attending Cardiologist  Direct Dial: [phone]  Fax: [phone]  Website:  [email or web address] 09/10/2017, 5:52 PM

## 2017-09-10 NOTE — Patient Instructions (Signed)
Medication Instructions:   START hydrochlorothiazide 12.5mg  daily  Labwork:  Please come back to our office for lab work Monday or Tuesday next week (BMET)  Testing/Procedures:  Your physician has recommended that you wear an event monitor for 2 weeks. Event monitors are medical devices that record the heart's electrical activity. Doctors most often [place] these monitors to diagnose arrhythmias. Arrhythmias are problems with the speed or rhythm of the heartbeat. The monitor is a small, portable device. You can wear one while you do your normal daily activities. This is usually used to diagnose what is causing palpitations/syncope (passing out).  Follow-Up:  Your physician recommends that you schedule a follow-up appointment in 6-8 weeks with Dr. Debara Pickett after your testing.   If you need a refill on your cardiac medications before your next appointment, please call your pharmacy.  Any Other Special Instructions Will Be Listed Below (If Applicable).

## 2017-09-15 DIAGNOSIS — Z79899 Other long term (current) drug therapy: Secondary | ICD-10-CM | POA: Diagnosis not present

## 2017-09-16 LAB — BASIC METABOLIC PANEL
BUN / CREAT RATIO: 19 (ref 12–28)
BUN: 17 mg/dL (ref 8–27)
CHLORIDE: 100 mmol/L (ref 96–106)
CO2: 25 mmol/L (ref 20–29)
Calcium: 9.6 mg/dL (ref 8.7–10.3)
Creatinine, Ser: 0.88 mg/dL (ref 0.57–1.00)
GFR calc non Af Amer: 61 mL/min/{1.73_m2} (ref >59)
GFR, EST AFRICAN AMERICAN: 71 mL/min/{1.73_m2} (ref >59)
Glucose: 84 mg/dL (ref 65–99)
POTASSIUM: 4.9 mmol/L (ref 3.5–5.2)
Sodium: 141 mmol/L (ref 134–144)

## 2017-09-30 ENCOUNTER — Ambulatory Visit (INDEPENDENT_AMBULATORY_CARE_PROVIDER_SITE_OTHER): Payer: Medicare Other

## 2017-09-30 DIAGNOSIS — I4891 Unspecified atrial fibrillation: Secondary | ICD-10-CM | POA: Diagnosis not present

## 2017-09-30 DIAGNOSIS — R002 Palpitations: Secondary | ICD-10-CM | POA: Diagnosis not present

## 2017-09-30 DIAGNOSIS — R42 Dizziness and giddiness: Secondary | ICD-10-CM | POA: Diagnosis not present

## 2017-10-08 DIAGNOSIS — R202 Paresthesia of skin: Secondary | ICD-10-CM | POA: Diagnosis not present

## 2017-10-08 DIAGNOSIS — Z8679 Personal history of other diseases of the circulatory system: Secondary | ICD-10-CM | POA: Diagnosis not present

## 2017-10-08 DIAGNOSIS — J449 Chronic obstructive pulmonary disease, unspecified: Secondary | ICD-10-CM | POA: Diagnosis not present

## 2017-10-08 DIAGNOSIS — I10 Essential (primary) hypertension: Secondary | ICD-10-CM | POA: Diagnosis not present

## 2017-10-08 DIAGNOSIS — G2581 Restless legs syndrome: Secondary | ICD-10-CM | POA: Diagnosis not present

## 2017-10-10 ENCOUNTER — Emergency Department (HOSPITAL_COMMUNITY)
Admission: EM | Admit: 2017-10-10 | Discharge: 2017-10-10 | Disposition: A | Discharge status: Home / Self Care | Payer: Medicare Other | Attending: Emergency Medicine | Admitting: Emergency Medicine

## 2017-10-10 ENCOUNTER — Encounter (HOSPITAL_COMMUNITY): Payer: Self-pay | Admitting: Emergency Medicine

## 2017-10-10 ENCOUNTER — Emergency Department (HOSPITAL_COMMUNITY): Payer: Medicare Other

## 2017-10-10 DIAGNOSIS — Z7901 Long term (current) use of anticoagulants: Secondary | ICD-10-CM | POA: Insufficient documentation

## 2017-10-10 DIAGNOSIS — Z79899 Other long term (current) drug therapy: Secondary | ICD-10-CM | POA: Diagnosis not present

## 2017-10-10 DIAGNOSIS — J449 Chronic obstructive pulmonary disease, unspecified: Secondary | ICD-10-CM | POA: Diagnosis not present

## 2017-10-10 DIAGNOSIS — I480 Paroxysmal atrial fibrillation: Secondary | ICD-10-CM | POA: Diagnosis not present

## 2017-10-10 DIAGNOSIS — Z7902 Long term (current) use of antithrombotics/antiplatelets: Secondary | ICD-10-CM | POA: Diagnosis not present

## 2017-10-10 DIAGNOSIS — I10 Essential (primary) hypertension: Secondary | ICD-10-CM | POA: Diagnosis not present

## 2017-10-10 DIAGNOSIS — Z87891 Personal history of nicotine dependence: Secondary | ICD-10-CM | POA: Diagnosis not present

## 2017-10-10 DIAGNOSIS — R002 Palpitations: Secondary | ICD-10-CM | POA: Diagnosis not present

## 2017-10-10 LAB — CBC
HEMATOCRIT: 44.2 % (ref 36.0–46.0)
Hemoglobin: 15.2 g/dL — ABNORMAL HIGH (ref 12.0–15.0)
MCH: 33.6 pg (ref 26.0–34.0)
MCHC: 34.4 g/dL (ref 30.0–36.0)
MCV: 97.6 fL (ref 78.0–100.0)
PLATELETS: 355 10*3/uL (ref 150–400)
RBC: 4.53 MIL/uL (ref 3.87–5.11)
RDW: 13 % (ref 11.5–15.5)
WBC: 8.9 10*3/uL (ref 4.0–10.5)

## 2017-10-10 LAB — BASIC METABOLIC PANEL
Anion gap: 7 (ref 5–15)
BUN: 26 mg/dL — AB (ref 6–20)
CALCIUM: 9.4 mg/dL (ref 8.9–10.3)
CO2: 27 mmol/L (ref 22–32)
CREATININE: 0.93 mg/dL (ref 0.44–1.00)
Chloride: 100 mmol/L — ABNORMAL LOW (ref 101–111)
GFR calc Af Amer: >60 mL/min (ref >60)
GFR, EST NON AFRICAN AMERICAN: 56 mL/min — AB (ref >60)
GLUCOSE: 129 mg/dL — AB (ref 65–99)
POTASSIUM: 4.3 mmol/L (ref 3.5–5.1)
Sodium: 134 mmol/L — ABNORMAL LOW (ref 135–145)

## 2017-10-10 NOTE — ED Triage Notes (Signed)
Pt. Stated Holly Benson been having A-Fib for about 34 days and doesn't seem to be disappearing, just off and on

## 2017-10-10 NOTE — ED Notes (Signed)
Pt taken off monitor and dressed.

## 2017-10-10 NOTE — ED Provider Notes (Signed)
[facility] EMERGENCY DEPARTMENT Provider Note   CSN: [id number] Arrival date & time: 10/10/17  1502     History   Chief Complaint Chief Complaint  Patient presents with  . Atrial Fibrillation    HPI Holly Benson is a 83 y.o. female.  Patient has recently relocated back to this area from a.  Patient seen by Dr. Debara Pickett in November.  Patient has known atrial fib is on diltiazem and is on Eliquis.  At the time that they saw her she stated that she felt as if she was having some heart palpitations.  So they arranged for monitoring.  Patient is currently undergoing monitor it is a cell phone connection monitor.  Patient continues to have the palpitation feelings.  Patient denies any shortness of breath or chest pain.      Past Medical History:  Diagnosis Date  . Atrial fibrillation ([place])   . COPD (chronic obstructive pulmonary disease) ([place])   . Hyperlipidemia   . Hypertension     Patient Active Problem List   Diagnosis Date Noted  . Heart palpitations 09/10/2017  . Atrial fibrillation ([place]) 09/10/2017  . Essential hypertension 09/10/2017  . Mixed hyperlipidemia 09/10/2017  . COPD (chronic obstructive pulmonary disease) ([place]) 07/21/2017  . Cough 07/21/2017    History reviewed. No pertinent surgical history.  OB History    No data available       Home Medications    Prior to Admission medications   Medication Sig Start Date End Date Taking? Authorizing Provider  apixaban (ELIQUIS) 5 MG TABS tablet Take 5 mg by mouth 2 (two) times daily.    [provider]  diltiazem (CARDIZEM CD) 240 MG 24 hr capsule Take 240 mg by mouth daily.    [provider]  hydrochlorothiazide (MICROZIDE) 12.5 MG capsule Take 1 capsule (12.5 mg total) daily by mouth. 09/10/17 12/09/17  Pixie Casino, MD  losartan (COZAAR) 100 MG tablet Take 100 mg by mouth daily. 06/15/17   [provider]  pravastatin (PRAVACHOL) 40 MG tablet Take 40 mg by  mouth daily.    [provider]  Tiotropium Bromide-Olodaterol (STIOLTO RESPIMAT) 2.5-2.5 MCG/ACT AERS Inhale 2 puffs into the lungs daily. 08/20/17   Collene Gobble, MD    Family History Family History  Problem Relation Age of Onset  . Suicidality Father   . Stroke Maternal Grandfather     Social History Social History   Tobacco Use  . Smoking status: Former Smoker    Packs/day: 1.50    Years: 30.00    Pack years: 45.00    Last attempt to quit: 11/04/1991    Years since quitting: 25.9  . Smokeless tobacco: Never Used  Substance Use Topics  . Alcohol use: Not on file  . Drug use: No     Allergies   Lovenox [enoxaparin sodium]; Amlodipine; and Metoprolol   Review of Systems Review of Systems  Constitutional: Negative for fever.  HENT: Negative for congestion.   Eyes: Negative for visual disturbance.  Respiratory: Negative for shortness of breath.   Cardiovascular: Positive for palpitations. Negative for chest pain.  Gastrointestinal: Negative for abdominal pain.  Genitourinary: Negative for dysuria.  Musculoskeletal: Negative for back pain.  Skin: Negative for rash.  Neurological: Negative for headaches.  Hematological: Bruises/bleeds easily.  Psychiatric/Behavioral: Negative for confusion.     Physical Exam Updated Vital Signs BP (!) 127/58   Pulse 68   Temp 97.8 F (36.6 C) (Oral)   Resp 15  Ht 1.6 m (5\' 3" )   Wt 61.2 kg (135 lb)   SpO2 95%   BMI 23.91 kg/m   Physical Exam  Constitutional: She is oriented to person, place, and time. She appears well-developed and well-nourished. No distress.  HENT:  Head: Normocephalic and atraumatic.  Mouth/Throat: Oropharynx is clear and moist.  Eyes: Conjunctivae and EOM are normal. Pupils are equal, round, and reactive to light.  Neck: Normal range of motion. Neck supple.  Cardiovascular: Normal rate, regular rhythm and normal heart sounds.  Pulmonary/Chest: Effort normal and breath sounds normal.    Abdominal: Soft. Bowel sounds are normal. There is no tenderness.  Musculoskeletal: Normal range of motion. She exhibits no edema.  Neurological: She is alert and oriented to person, place, and time. No cranial nerve deficit or sensory deficit. She exhibits normal muscle tone. Coordination normal.  Skin: Skin is warm.  Nursing note and vitals reviewed.    ED Treatments / Results  Labs (all labs ordered are listed, but only abnormal results are displayed) Labs Reviewed  BASIC METABOLIC PANEL - Abnormal; Notable for the following components:      Result Value   Sodium 134 (*)    Chloride 100 (*)    Glucose, Bld 129 (*)    BUN 26 (*)    GFR calc non Af Amer 56 (*)    All other components within normal limits  CBC - Abnormal; Notable for the following components:   Hemoglobin 15.2 (*)    All other components within normal limits    EKG  EKG Interpretation  Date/Time:  Saturday October 10 2017 15:10:49 EST Ventricular Rate:  77 PR Interval:  148 QRS Duration: 86 QT Interval:  368 QTC Calculation: 416 R Axis:   32 Text Interpretation:  Normal sinus rhythm Possible Left atrial enlargement Borderline ECG No previous ECGs available Confirmed by Fredia Sorrow [phone]) on 10/10/2017 4:17:16 PM       Radiology Dg Chest 2 View  Result Date: 10/10/2017 CLINICAL DATA:  Atrial fibrillation for 34 days.  History of COPD. EXAM: CHEST  2 VIEW COMPARISON:  Chest radiograph January 25, 2010 and CT chest January 29, 2010 FINDINGS: Cardiac silhouette is mildly enlarged. Calcified aortic knob. Mild chronic interstitial changes and increased lung volumes without pleural effusion or focal consolidation. Biapical pleural thickening. No pneumothorax. Osteopenia. Mild old approximate L1 compression fracture. RIGHT humerus ORIF. Battery pack projects over RIGHT abdomen. IMPRESSION: Mild cardiomegaly.  COPD. Aortic Atherosclerosis (ICD10-I70.0) and Emphysema (ICD10-J43.9). Electronically Signed   By:  Elon Alas M.D.   On: 10/10/2017 16:00    Procedures Procedures (including critical care time)  Medications Ordered in ED Medications - No data to display   Initial Impression / Assessment and Plan / ED Course  I have reviewed the triage vital signs and the nursing notes.  Pertinent labs & imaging results that were available during my care of the patient were reviewed by me and considered in my medical decision making (see chart for details).     Cardiac monitoring here without any significant rapid atrial fibrillation.  EKG monitoring looks mostly like sinus.  But they could occasionally be some atrial beats.  Certainly ventricular rate is well controlled.  Patient is taking her blood thinners.  Chest x-ray without any acute findings.  Basic labs without any significant abnormalities.  Patient does have her cardiac monitoring device with her.  She will continue to do that.  She has follow-up with cardiology next week.  Patient was  reassured that this should be real-time evaluation so something is significant she will be called.  But if she is concerned about her heart rate being fast she can use her blood pressure monitor at home which will give her a pulse.  She will return for heart rate 130 or greater for an hour.  Report for any new or worse symptoms.  Patient stable for discharge home.  Cardiac monitoring here showed no significant arrhythmias.  Ventricular rate well controlled.  Final Clinical Impressions(s) / ED Diagnoses   Final diagnoses:  Heart palpitations  Paroxysmal atrial fibrillation [facility])    ED Discharge Orders    None       Fredia Sorrow, MD 10/10/17 [phone]

## 2017-10-10 NOTE — Discharge Instructions (Signed)
Return for rapid heart rate.  Particularly heartbeat persistently at 140 or greater.  Continue your blood pressure monitoring device at home to get a feel for what sure pulses.  If your heart rate is faster than 130 on a regular basis and lasting for about an hour return for further evaluation.

## 2017-10-10 NOTE — ED Notes (Signed)
RN went to discharge patient. Patient no longer in room. Patient was aware that she was being discharged, and left with family member. Left without discharge papers or signing e-signature for discharge.

## 2017-10-10 NOTE — ED Notes (Signed)
Patient ambulated to restroom with standby assistance. No distress noted.

## 2017-10-13 ENCOUNTER — Telehealth (HOSPITAL_COMMUNITY): Payer: Self-pay | Admitting: *Deleted

## 2017-10-13 NOTE — Telephone Encounter (Signed)
I contacted pt regarding recent ED visit.  Pt is going to contact Dr. Rod Mae office for follow up and is still wearing her monitor until tomorrow.  Pt stated she is feeling fine and taking her blood thinner.  No follow up from afib clinic needed at this time

## 2017-10-29 ENCOUNTER — Encounter: Payer: Self-pay | Admitting: *Deleted

## 2017-11-09 ENCOUNTER — Telehealth: Payer: Self-pay | Admitting: Internal Medicine

## 2017-11-09 NOTE — Telephone Encounter (Signed)
Returned call to patient of Dr. Debara Pickett who complains of elevated BP and HR.   She reports her BP has fluctuated but right now it is good. Has been 150s/80s occasionally 160s/90s. Her HR has been consistently in 120s for about 3 days - previously was in the 90s.   Patient reports "spells" were she felt like she had AF. She went to ED for similar symptoms and no AF was picked up on.   Patient wore event monitor and no AF was identified.   Patient scheduled for sooner f/up than 1/15 - will see MD on 1/8 @ 2:30pm. Advised that she bring her list of home BP and HR readings for MD to review.   Routed to MD for any further advice.

## 2017-11-09 NOTE — Telephone Encounter (Signed)
Holly Benson is calling because both her Blood Pressure and heart rate has been elevated .She does have an appointment next week and wants to know if she needs to come in earlier or is it safe to wait until her appt . Time . Please call

## 2017-11-10 ENCOUNTER — Encounter: Payer: Self-pay | Admitting: Internal Medicine

## 2017-11-10 ENCOUNTER — Ambulatory Visit (INDEPENDENT_AMBULATORY_CARE_PROVIDER_SITE_OTHER): Payer: Medicare Other | Admitting: Internal Medicine

## 2017-11-10 VITALS — BP 131/72 | HR 85 | Ht 63.0 in | Wt 135.4 lb

## 2017-11-10 DIAGNOSIS — I4891 Unspecified atrial fibrillation: Secondary | ICD-10-CM

## 2017-11-10 DIAGNOSIS — I429 Cardiomyopathy, unspecified: Secondary | ICD-10-CM | POA: Diagnosis not present

## 2017-11-10 DIAGNOSIS — R0602 Shortness of breath: Secondary | ICD-10-CM | POA: Diagnosis not present

## 2017-11-10 DIAGNOSIS — R002 Palpitations: Secondary | ICD-10-CM | POA: Diagnosis not present

## 2017-11-10 MED ORDER — METOPROLOL SUCCINATE ER 25 MG PO TB24: 12.5000 mg | ORAL_TABLET | Freq: Every day | ORAL | 5 refills | Status: DC

## 2017-11-10 NOTE — Patient Instructions (Addendum)
Your physician has recommended you make the following change in your medication:  -- START metoprolol succinate 12.5mg  once daily   Your physician has requested that you have an echocardiogram @ [address] - [address]rd [address]. Echocardiography is a painless test that uses sound waves to create images of your heart. It provides your doctor with information about the size and shape of your heart and how well your heart's chambers and valves are working. This procedure takes approximately one hour. There are no restrictions for this procedure.  Your physician recommends that you schedule a follow-up appointment with Dr. Debara Pickett after your test.

## 2017-11-10 NOTE — Progress Notes (Signed)
OFFICE NOTE  Chief Complaint:  Follow-up monitor  Primary Care Physician: London Pepper, MD  HPI:  Holly Benson is a 84 y.o. female with a past medial history significant for atrial fibrillation, COPD, hypertension and dyslipidemia.  She is originally from [place] but has been living in [place].  She was established and treated by Dr. Gloriann Loan, a cardiac electrophysiologist with [facility].  She underwent electrical cardioversion and was offered an A. fib ablation, but preferred medical therapy.  She has been on diltiazem and Eliquis as well as losartan for hypertension.  She also takes pravastatin and Spiriva for her COPD.  She denies any chest pain or worsening shortness of breath.  She reports she has had a stress test and an echo in the past, neither which were remarkable.  We will try to obtain those records from her prior cardiologist.  Recently she has noted some increase in palpitations and feeling like her heart is pounding.  This is mostly at night when she is trying to go to bed.  She says it can happen several times a week, but it certainly not daily.  11/10/2017  Holly Benson returns today for follow-up.  She underwent monitoring which demonstrated sinus rhythm with PACs and PVCs.  There were some periods of sinus tachycardia for which correlated with her palpitations.  No evidence of atrial fibrillation was noted.  She says she continues to have episodes of hard heartbeats and feeling like her heart is pounding.  She discussed this further with her daughter-in-law over the holidays who is a Librarian, academic.  I was provided with a letter which indicated all of her symptoms including twice a week where she wakes up in the middle night feeling suffocated.  She has had episodes of reactive hypoglycemia in the past that would cause elevated heart rate however blood sugars were normal this week.  She went to the ER on December 10 for elevated heart rate,  lightheadedness and elevated blood pressure however workup was unremarkable.  There was a concern for possible dehydration.  In the past her echo in [place] showed a EF was around 40-45% however she said that was more than 5 years ago.  LV function has not been reassessed.  PMHx:  Past Medical History:  Diagnosis Date  . Atrial fibrillation ([place])   . COPD (chronic obstructive pulmonary disease) ([place])   . Hyperlipidemia   . Hypertension     No past surgical history on file.  FAMHx:  Family History  Problem Relation Age of Onset  . Suicidality Father   . Stroke Maternal Grandfather     SOCHx:   reports that she quit smoking about 26 years ago. She has a 45.00 pack-year smoking history. she has never used smokeless tobacco. She reports that she does not use drugs. Her alcohol history is not on file.  ALLERGIES:  Allergies  Allergen Reactions  . Lovenox [Enoxaparin Sodium] Hives  . Amlodipine     Muscle pain  . Metoprolol     Muscle aches    ROS: Pertinent items noted in HPI and remainder of comprehensive ROS otherwise negative.  HOME MEDS: Current Outpatient Medications on File Prior to Visit  Medication Sig Dispense Refill  . apixaban (ELIQUIS) 5 MG TABS tablet Take 5 mg by mouth 2 (two) times daily.    Marland Kitchen diltiazem (CARDIZEM CD) 240 MG 24 hr capsule Take 240 mg by mouth daily.    . hydrochlorothiazide (MICROZIDE) 12.5 MG capsule Take  1 capsule (12.5 mg total) daily by mouth. 30 capsule 3  . losartan (COZAAR) 100 MG tablet Take 100 mg by mouth daily.    . pravastatin (PRAVACHOL) 40 MG tablet Take 40 mg by mouth daily.    . Tiotropium Bromide-Olodaterol (STIOLTO RESPIMAT) 2.5-2.5 MCG/ACT AERS Inhale 2 puffs into the lungs daily. 3 Inhaler 3   No current facility-administered medications on file prior to visit.     LABS/IMAGING: No results found for this or any previous visit (from the past 48 hour(s)). No results found.  LIPID PANEL: No results found for: CHOL, TRIG,  HDL, CHOLHDL, VLDL, LDLCALC, LDLDIRECT   WEIGHTS: Wt Readings from Last 3 Encounters:  11/10/17 135 lb 6.4 oz (61.4 kg)  10/10/17 135 lb (61.2 kg)  09/10/17 138 lb (62.6 kg)    VITALS: BP 131/72   Pulse 85   Ht 5\' 3"  (1.6 m)   Wt 135 lb 6.4 oz (61.4 kg)   BMI 23.99 kg/m   EXAM: General appearance: alert, no distress and thin Lungs: clear to auscultation bilaterally Heart: regular rate and rhythm Extremities: extremities normal, atraumatic, no cyanosis or edema Neurologic: Grossly normal Psych: Anxious  EKG: Deferred  ASSESSMENT: 1. History of atrial fibrillation, status post DCCV 2. Palpitations 3. CHADSVASC score of 4 - on Eliquis 4. Hypertension  5. Dyslipidemia  PLAN: 1.   Holly Benson has had some continued palpitations and periods of sinus tachycardia which are probably an appropriate.  I do not see any evidence for atrial fibrillation.  She is on Eliquis.  I advised that we add low-dose beta-blocker to her calcium channel blocker for some synergistic benefit.  In addition we will repeat echocardiogram given the fact her LVEF was reduced to 40-45% in the past.  Hopefully this is normalized.  She is not describing any typical anginal symptoms.  Plan to see her back in about a month.  Pixie Casino, MD, [place]  Attending Cardiologist  Direct Dial: [phone]  Fax: [phone]  Website:  [email or web address] 11/10/2017, 5:25 PM

## 2017-11-17 ENCOUNTER — Ambulatory Visit: Payer: Medicare Other | Admitting: Internal Medicine

## 2017-11-18 ENCOUNTER — Ambulatory Visit (HOSPITAL_COMMUNITY): Payer: Medicare Other | Attending: Internal Medicine

## 2017-11-18 ENCOUNTER — Other Ambulatory Visit: Payer: Self-pay

## 2017-11-18 ENCOUNTER — Telehealth: Payer: Self-pay | Admitting: Internal Medicine

## 2017-11-18 DIAGNOSIS — I429 Cardiomyopathy, unspecified: Secondary | ICD-10-CM | POA: Insufficient documentation

## 2017-11-18 DIAGNOSIS — R0602 Shortness of breath: Secondary | ICD-10-CM

## 2017-11-18 DIAGNOSIS — I071 Rheumatic tricuspid insufficiency: Secondary | ICD-10-CM | POA: Diagnosis not present

## 2017-11-18 NOTE — Telephone Encounter (Signed)
New message    Call patient with echo results.

## 2017-11-18 NOTE — Telephone Encounter (Signed)
Notes recorded by Pixie Casino, MD on 11/18/2017 at 3:47 PM EST Normal systolic function, moderate pulmonary hypertension and biatrial enlargement. Dr. Lemmie Evens  PT NOTIFIED

## 2017-11-25 ENCOUNTER — Telehealth: Payer: Self-pay | Admitting: Internal Medicine

## 2017-11-25 NOTE — Telephone Encounter (Signed)
New message    Patient calling to request letter from Dr Debara Pickett to give to insurance. She received  EOB which indicates she needs letter of medical necessity for event monitor she had in 11/28 2018. Patient has already spoken to  [facility] billing and they have referred her back to Dr Debara Pickett to obtain . Please call

## 2017-11-25 NOTE — Telephone Encounter (Signed)
Returned call to patient.She stated [employer] has denied payment for monitor.Stated they need a letter stating monitor was medically necessary.Advised I will send message to Dr.Hilty's RN.

## 2017-11-26 NOTE — Telephone Encounter (Signed)
Called patient and asked that she provide [place] with a copy of the denial letter/info from [facility] so we know what to state in the letter of medical necessity. She will bring it by today.

## 2017-12-01 NOTE — Telephone Encounter (Signed)
Patient called and notified that appeals letter is composed. Asked that she provide [place] with a fax # for [facility] appeals department so that the letter can be submitted to the appropriate place to be processed. She will call back with this info.

## 2017-12-01 NOTE — Telephone Encounter (Signed)
Faxed appeals/medical necessity letter and office note to [facility]

## 2017-12-01 NOTE — Telephone Encounter (Signed)
Follow Up:    The fax number for [employer] is [phone].

## 2017-12-09 DIAGNOSIS — R109 Unspecified abdominal pain: Secondary | ICD-10-CM | POA: Diagnosis not present

## 2017-12-09 DIAGNOSIS — J449 Chronic obstructive pulmonary disease, unspecified: Secondary | ICD-10-CM | POA: Diagnosis not present

## 2017-12-09 DIAGNOSIS — Z8679 Personal history of other diseases of the circulatory system: Secondary | ICD-10-CM | POA: Diagnosis not present

## 2017-12-09 DIAGNOSIS — K439 Ventral hernia without obstruction or gangrene: Secondary | ICD-10-CM | POA: Diagnosis not present

## 2017-12-09 DIAGNOSIS — I10 Essential (primary) hypertension: Secondary | ICD-10-CM | POA: Diagnosis not present

## 2017-12-12 DIAGNOSIS — J209 Acute bronchitis, unspecified: Secondary | ICD-10-CM | POA: Diagnosis not present

## 2017-12-21 ENCOUNTER — Telehealth: Payer: Self-pay | Admitting: Emergency Medicine

## 2017-12-21 DIAGNOSIS — J449 Chronic obstructive pulmonary disease, unspecified: Secondary | ICD-10-CM | POA: Diagnosis not present

## 2017-12-21 DIAGNOSIS — J209 Acute bronchitis, unspecified: Secondary | ICD-10-CM | POA: Diagnosis not present

## 2017-12-21 MED ORDER — ALBUTEROL SULFATE HFA 108 (90 BASE) MCG/ACT IN AERS: 2.0000 | INHALATION_SPRAY | RESPIRATORY_TRACT | 6 refills | Status: DC | PRN

## 2017-12-21 NOTE — Telephone Encounter (Signed)
Called patient, unable to reach left message to give us a call back. 

## 2017-12-21 NOTE — Telephone Encounter (Signed)
Patient is requesting a rescue inhaler for SOB. She never received the one from before. RB is this ok to send in.

## 2017-12-21 NOTE — Telephone Encounter (Signed)
Yes please call albuterol HFA 2 puffs up to every 4 hours if needed for shortness of breath, wheezing, chest tightness

## 2017-12-21 NOTE — Telephone Encounter (Signed)
Called and spoke with patient she is aware nothing further needed.  

## 2017-12-23 ENCOUNTER — Telehealth: Payer: Self-pay | Admitting: Internal Medicine

## 2017-12-23 NOTE — Telephone Encounter (Signed)
Received incoming records from [facility] for upcoming appointment on 01/12/18 @ 9:15am with Dr. Debara Pickett. Records located in Medical Records. 12/23/17 ab

## 2017-12-25 DIAGNOSIS — K439 Ventral hernia without obstruction or gangrene: Secondary | ICD-10-CM | POA: Diagnosis not present

## 2017-12-29 ENCOUNTER — Telehealth: Payer: Self-pay | Admitting: Emergency Medicine

## 2017-12-29 NOTE — Telephone Encounter (Signed)
Noted  

## 2018-01-05 ENCOUNTER — Other Ambulatory Visit: Payer: Self-pay | Admitting: Internal Medicine

## 2018-01-10 ENCOUNTER — Other Ambulatory Visit: Payer: Self-pay | Admitting: Internal Medicine

## 2018-01-12 ENCOUNTER — Telehealth: Payer: Self-pay | Admitting: Internal Medicine

## 2018-01-12 ENCOUNTER — Ambulatory Visit (INDEPENDENT_AMBULATORY_CARE_PROVIDER_SITE_OTHER): Payer: Medicare Other | Admitting: Internal Medicine

## 2018-01-12 ENCOUNTER — Encounter: Payer: Self-pay | Admitting: Internal Medicine

## 2018-01-12 VITALS — BP 145/82 | HR 92 | Ht 63.0 in | Wt 138.4 lb

## 2018-01-12 DIAGNOSIS — I10 Essential (primary) hypertension: Secondary | ICD-10-CM | POA: Diagnosis not present

## 2018-01-12 DIAGNOSIS — I429 Cardiomyopathy, unspecified: Secondary | ICD-10-CM

## 2018-01-12 DIAGNOSIS — I4891 Unspecified atrial fibrillation: Secondary | ICD-10-CM

## 2018-01-12 DIAGNOSIS — I483 Typical atrial flutter: Secondary | ICD-10-CM | POA: Diagnosis not present

## 2018-01-12 NOTE — Patient Instructions (Signed)
Your physician wants you to follow-up in: 6 months with Dr. Hilty. You will receive a reminder letter in the mail two months in advance. If you don't receive a letter, please call our office to schedule the follow-up appointment.    

## 2018-01-12 NOTE — Telephone Encounter (Signed)
Contacted [facility]. They state they did not have anything on recall and if they did, they would have personally contacted the patient.   Patient aware.

## 2018-01-12 NOTE — Progress Notes (Signed)
OFFICE NOTE  Chief Complaint:  Follow-up med changes  Primary Care Physician: London Pepper, MD  HPI:  Holly Benson is a 84 y.o. female with a past medial history significant for atrial fibrillation, COPD, hypertension and dyslipidemia.  She is originally from [place] but has been living in [place].  She was established and treated by Dr. Gloriann Loan, a cardiac electrophysiologist with [facility].  She underwent electrical cardioversion and was offered an A. fib ablation, but preferred medical therapy.  She has been on diltiazem and Eliquis as well as losartan for hypertension.  She also takes pravastatin and Spiriva for her COPD.  She denies any chest pain or worsening shortness of breath.  She reports she has had a stress test and an echo in the past, neither which were remarkable.  We will try to obtain those records from her prior cardiologist.  Recently she has noted some increase in palpitations and feeling like her heart is pounding.  This is mostly at night when she is trying to go to bed.  She says it can happen several times a week, but it certainly not daily.  11/10/2017  Holly Benson returns today for follow-up.  She underwent monitoring which demonstrated sinus rhythm with PACs and PVCs.  There were some periods of sinus tachycardia for which correlated with her palpitations.  No evidence of atrial fibrillation was noted.  She says she continues to have episodes of hard heartbeats and feeling like her heart is pounding.  She discussed this further with her daughter-in-law over the holidays who is a Librarian, academic.  I was provided with a letter which indicated all of her symptoms including twice a week where she wakes up in the middle night feeling suffocated.  She has had episodes of reactive hypoglycemia in the past that would cause elevated heart rate however blood sugars were normal this week.  She went to the ER on December 10 for elevated heart rate,  lightheadedness and elevated blood pressure however workup was unremarkable.  There was a concern for possible dehydration.  In the past her echo in [place] showed a EF was around 40-45% however she said that was more than 5 years ago.  LV function has not been reassessed.  01/12/2018  Holly Benson returns today for follow-up.  When I last saw her I placed her on low-dose metoprolol for additional rate control.  Today she is noted to be in atrial flutter with variable ventricular response and PVCs at 79.  When I walked in I asked her how she was feeling and she said she felt great.  She is asymptomatic with this and was not aware that she was out of rhythm.  Interestingly on her monitor she only showed some PACs and PVCs with predominantly sinus rhythm but was having symptoms of palpitations.  It does not seem that her symptoms necessarily correlate with her arrhythmias.  This seems to be the case when I reviewed her records from [place].  She had both A. fib and a flutter was initially considered for flutter ablation however with her concomitant A. fib this was not pursued.  She also has had chest discomfort symptoms and underwent exercise treadmill testing in January 2017 which was negative for ischemia.  LVEF was 40-45% in the past however based on a repeat echo we performed this past month, her LVEF is now 60-65%.  She does have biatrial enlargement.  PMHx:  Past Medical History:  Diagnosis Date  . Atrial fibrillation (  [place])   . COPD (chronic obstructive pulmonary disease) ([place])   . Hyperlipidemia   . Hypertension     No past surgical history on file.  FAMHx:  Family History  Problem Relation Age of Onset  . Suicidality Father   . Stroke Maternal Grandfather     SOCHx:   reports that she quit smoking about 26 years ago. She has a 45.00 pack-year smoking history. she has never used smokeless tobacco. She reports that she does not use drugs. Her alcohol history is not on file.  ALLERGIES:    Allergies  Allergen Reactions  . Lovenox [Enoxaparin Sodium] Hives  . Amlodipine     Muscle pain  . Metoprolol     Muscle aches    ROS: Pertinent items noted in HPI and remainder of comprehensive ROS otherwise negative.  HOME MEDS: Current Outpatient Medications on File Prior to Visit  Medication Sig Dispense Refill  . albuterol (PROVENTIL HFA;VENTOLIN HFA) 108 (90 Base) MCG/ACT inhaler Inhale 2 puffs into the lungs every 4 (four) hours as needed for wheezing or shortness of breath. 1 Inhaler 6  . apixaban (ELIQUIS) 5 MG TABS tablet Take 5 mg by mouth 2 (two) times daily.    . budesonide-formoterol (SYMBICORT) 160-4.5 MCG/ACT inhaler Inhale 2 puffs into the lungs 2 (two) times daily.    Marland Kitchen diltiazem (CARDIZEM CD) 240 MG 24 hr capsule Take 240 mg by mouth daily.    . hydrochlorothiazide (MICROZIDE) 12.5 MG capsule TAKE ONE CAPSULE BY MOUTH DAILY 60 capsule 11  . losartan (COZAAR) 100 MG tablet Take 100 mg by mouth daily.    . metoprolol succinate (TOPROL-XL) 25 MG 24 hr tablet Take 0.5 tablets (12.5 mg total) by mouth daily. 15 tablet 5  . pravastatin (PRAVACHOL) 40 MG tablet Take 40 mg by mouth daily.    . Tiotropium Bromide Monohydrate (SPIRIVA RESPIMAT) 2.5 MCG/ACT AERS Inhale 2 puffs into the lungs daily.     No current facility-administered medications on file prior to visit.     LABS/IMAGING: No results found for this or any previous visit (from the past 48 hour(s)). No results found.  LIPID PANEL: No results found for: CHOL, TRIG, HDL, CHOLHDL, VLDL, LDLCALC, LDLDIRECT   WEIGHTS: Wt Readings from Last 3 Encounters:  01/12/18 138 lb 6.4 oz (62.8 kg)  11/10/17 135 lb 6.4 oz (61.4 kg)  10/10/17 135 lb (61.2 kg)    VITALS: BP (!) 145/82   Pulse 92   Ht 5\' 3"  (1.6 m)   Wt 138 lb 6.4 oz (62.8 kg)   BMI 24.52 kg/m   EXAM: General appearance: alert, no distress and thin Lungs: clear to auscultation bilaterally Heart: regular rate and rhythm Extremities:  extremities normal, atraumatic, no cyanosis or edema Neurologic: Grossly normal Psych: Anxious  EKG: Atrial flutter with variable AV block and PVCs at 79-personally reviewed  ASSESSMENT: 1. History of atrial fibrillation, status post DCCV 2. Paroxysmal atrial flutter 3. Palpitations 4. CHADSVASC score of 4 - on Eliquis 5. Hypertension  6. Dyslipidemia  PLAN: 1.   Holly Benson has had normalization of LVEF by echo as of January 2019 at 60-65%.  She has biatrial enlargement.  She seems to be asymptomatic and is noted to be in atrial flutter today however rate controlled.  She says she feels great.  She has a history of both a flutter and A. fib.  She has had cardioversion but had recurrence of her symptoms.  She is not interested in pursuing cardioversion at  this time.  I think that is very reasonable.  She is anticoagulated on Eliquis and with the recent addition of metoprolol is rate controlled.  I would continue her current medications.  Plan to see her back in 6 months.  Pixie Casino, MD, [facility], [place] Director of the [facility] Lipid Disorders &  Cardiovascular Risk Reduction Clinic Diplomate of the American Board of Clinical Lipidology Attending Cardiologist  Direct Dial: [phone]  Fax: [phone]  Website:  [email or web address] 01/12/2018, 10:14 AM

## 2018-01-12 NOTE — Telephone Encounter (Signed)
Patient was seen in office today. Expressed concern to MD that her losartan 100mg  tablets may be on recall. She gets her Rx(s) from [facility] per MD.   Contacted [facility] and was notified patient gets Rx(s) from [place].  Contacted [facility] x2 - line busy.

## 2018-01-13 DIAGNOSIS — I10 Essential (primary) hypertension: Secondary | ICD-10-CM | POA: Diagnosis not present

## 2018-01-13 DIAGNOSIS — F410 Panic disorder [episodic paroxysmal anxiety] without agoraphobia: Secondary | ICD-10-CM | POA: Diagnosis not present

## 2018-01-13 DIAGNOSIS — K439 Ventral hernia without obstruction or gangrene: Secondary | ICD-10-CM | POA: Diagnosis not present

## 2018-01-13 DIAGNOSIS — Z8679 Personal history of other diseases of the circulatory system: Secondary | ICD-10-CM | POA: Diagnosis not present

## 2018-01-13 DIAGNOSIS — E785 Hyperlipidemia, unspecified: Secondary | ICD-10-CM | POA: Diagnosis not present

## 2018-01-13 DIAGNOSIS — J449 Chronic obstructive pulmonary disease, unspecified: Secondary | ICD-10-CM | POA: Diagnosis not present

## 2018-01-18 ENCOUNTER — Telehealth: Payer: Self-pay | Admitting: Internal Medicine

## 2018-01-18 NOTE — Telephone Encounter (Signed)
F/U Call:  Patient will not be available until after 10 am this morning

## 2018-01-18 NOTE — Telephone Encounter (Signed)
Yes - increase toprol XL to 25 mg (full tablet) daily - monitor blood pressure.  Dr. Lemmie Evens

## 2018-01-18 NOTE — Telephone Encounter (Signed)
STAT if HR is under 50 or over 120 (normal HR is 60-100 beats per minute)  1) What is your heart rate? 115  2) Do you have a log of your heart rate readings (document readings)? 114-116 for past couple days   3) Do you have any other symptoms? no  BP 159/86

## 2018-01-18 NOTE — Telephone Encounter (Signed)
Returned call to patient.She stated she wanted to ask Dr.Hilty if her medications needed to be adjusted.Stated pulse has been irregular and ranging 115,114,116.Stated she is going to [facility] tomorrow for 10 days to visit with her son and wanted to also ask Dr.Hilty if that is ok.Message sent to Dr.Hilty for advice.

## 2018-01-19 NOTE — Addendum Note (Signed)
Addended by: Kathyrn Lass on: 01/19/2018 09:05 AM   Modules accepted: Orders

## 2018-01-19 NOTE — Telephone Encounter (Signed)
Received a call from patient Dr.Hilty's recommendation given.Stated she feels good and will be going to [place] to visit son today.Stated she will call back if heart rate continues to be elevated.

## 2018-01-25 ENCOUNTER — Telehealth: Payer: Self-pay | Admitting: Internal Medicine

## 2018-01-25 NOTE — Telephone Encounter (Signed)
She could try to take 1/2 tablet of the toprol XL 25 mg in the am and 1/2 tablet in the pm. If am BP's are low, she could move the losartan pill to take at night. She takes diltiazem and HCTZ presumably in the morning as well?  Dr. Lemmie Evens

## 2018-01-25 NOTE — Telephone Encounter (Signed)
Left message to call back  

## 2018-01-25 NOTE — Telephone Encounter (Signed)
Advised patient, verbalized understanding  

## 2018-01-25 NOTE — Telephone Encounter (Signed)
Holly Benson is calling because last week Dr. Debara Pickett adjested her medications for her Blood Pressure and  Heart Rate . As of now her heart rate goes up into the Low 100"s ( 115) is the highest and her blood pressure is going up and dow as well . This morning it was 165/99 w/heart rate of 108. Please call   Thanks

## 2018-01-25 NOTE — Telephone Encounter (Signed)
Spoke with patient who is out of town. She feels good now but but usually starts to feel bad around 3:00 pm. Since the increase in her Toprol 25 mg to a full tablet she has been having issues with her blood pressure and heart rate dropping. She does think she is currently in Afib but she is unsure. She is taking her Eliquis daily. Her blood pressure did drop to 105/61 HR 50's. Saturday around 3:00 pm when she was feeling bad. Her SBP has been dropping into the 100's and she feels poorly with this. Her blood pressure this am was 165/99 HR 108, has not checked since then. She was wanting to know if she could try taking Toprol 25 mg 1/2 tablet twice a day to see if that would help. Advised ok to change to that until Dr Debara Pickett reviews and gives recommendations, will forward for review.

## 2018-02-05 ENCOUNTER — Telehealth: Payer: Self-pay | Admitting: Internal Medicine

## 2018-02-05 NOTE — Telephone Encounter (Signed)
New Message  Not consistant by 230 bp has dropped and hr dropped so far that she cant walk up the steps Pt c/o medication issue:  1. Name of Medication: losartan (COZAAR) 100 MG tablet,  metoprolol succinate (TOPROL XL) 25 MG 24 hr tablet  2. How are you currently taking this medication (dosage and times per day)? Take 100 mg by mouth daily, Take 25 mg by mouth as directed. 1/2 tablet by mouth twice a day  3. Are you having a reaction (difficulty breathing--STAT)? weakness  4. What is your medication issue? Pt states the medication is not consistent  and that by 2:30 in the afternoon her bp and hr is down so low she can barley walk up the steps

## 2018-02-05 NOTE — Telephone Encounter (Signed)
Have her just take 12.5 mg Toprol XL at night.  Dr. Lemmie Evens

## 2018-02-05 NOTE — Telephone Encounter (Signed)
LMTCB

## 2018-02-05 NOTE — Telephone Encounter (Signed)
Follow Up:; ° ° °Returning your call. °

## 2018-02-05 NOTE — Telephone Encounter (Signed)
Patient aware of MD recommendations. She agrees w/plan and voiced understanding. She has already take Toprol today and will start taking it in the evening tomorrow. Med list updated.

## 2018-02-05 NOTE — Telephone Encounter (Signed)
Returned call to patient of Dr. Debara Pickett who c/o low HR in the afternoons and feeling exhausted around 3pm each day. She feels fine in the AM. She reports she has not been taking afternoon dose of metoprolol succinate d/t low HR - she had been advised on 3/25 to split her toprol 25mg  in half and take BID. She takes losartan 100mg , hctz 12.5mg , toprol 12.5mg , diltiazem 240mg  all the morning. Her AM BP readings are taken a few minutes before or after her medications.   Routed to MD/CVRR to review and advised - should she rearrange how she takes her medications?  4/5 BP 165/95 (AM) HR 116  4/4  BP 165/95 (AM) HR 116 BP 124/67 (PM) HR 47  4/3 BP 159/82 (AM) HR 112 BP 158/69 (PM)  HR 56  4/2  HR 53 in afternoon  4/1 HR 60 in afternoon

## 2018-02-10 ENCOUNTER — Encounter: Payer: Self-pay | Admitting: Emergency Medicine

## 2018-02-10 ENCOUNTER — Ambulatory Visit (INDEPENDENT_AMBULATORY_CARE_PROVIDER_SITE_OTHER): Payer: Medicare Other | Admitting: Emergency Medicine

## 2018-02-10 DIAGNOSIS — J449 Chronic obstructive pulmonary disease, unspecified: Secondary | ICD-10-CM | POA: Diagnosis not present

## 2018-02-10 DIAGNOSIS — R059 Cough, unspecified: Secondary | ICD-10-CM

## 2018-02-10 DIAGNOSIS — R05 Cough: Secondary | ICD-10-CM

## 2018-02-10 NOTE — Assessment & Plan Note (Signed)
No real GERD or cough at this time.   If you are coughing begins to flare remember that this could be related to some silent reflux.  You may want to use your reflux medications under such circumstances.

## 2018-02-10 NOTE — Assessment & Plan Note (Signed)
She felt that the combo of Spiriva and Symbicort was superior to [employer], but she still has some Stiolto at home and she would like to perform another trial to see if she prefers it.  Ok to perform another trial of Stiolto 2 puffs once daily.  If you prefer this medication to your existing combination of Spiriva and Symbicort then please call our office and we will call in a prescription of Stiolto to your pharmacy. Keep albuterol available to use 2 puffs up to every 4 hours if needed for shortness of breath, wheezing, chest tightness. Get the flu shot in the fall. Follow with Dr Lamonte Sakai in 12 months or sooner if you have any problems

## 2018-02-10 NOTE — Progress Notes (Signed)
Subjective:    Patient ID: Holly Benson, female    DOB: 03-22-1933, 84 y.o.   MRN: [id number]  COPD  She complains of cough. There is no shortness of breath or wheezing. Pertinent negatives include no ear pain, fever, headaches, postnasal drip, rhinorrhea, sneezing, sore throat or trouble swallowing. Her past medical history is significant for COPD.   84 year old woman former smoker (45 pack year), with hx of hypertension, atrial fibrillation COPD made 4-5 years ago. She has just moved to [facility], is referred for management of COPD. She had PFT in 04/2017 in [place]. She also has exertional dyspnea with hills, stairs, sometimes has to stop to rest. She is able to do her home chores. No significant wheeze. He only exacerbation was with a URI 2 yrs ago. Her biggest complaint is cough, has been bothering her for ~18 months. She occasionally has nasal congestion, seems to be better than years past. She has GERD rarely.   ROV 08/20/17 -- This is a follow-up visit for evaluation of COPD and chronic cough. At her initial visit we did a trial of Stiolto, substituted this for Symbicort and Spiriva. She believes that she is doing about the same. Her cough may be slightly better. Still present. Occasional GERD.   ROV 02/10/18 --patient has a history of tobacco use (45 pack years) and associated COPD.  She also has chronic cough that is impacted by occasional GERD.  She is followed elsewhere for hypertension and atrial fibrillation. She reports today that she has been having periods of shakiness in the afternoons, has been having her meds adjusted including her metoprolol. She measures her BP at these times (usually 3-4pm), notes that it is 100's SBP, HR in the 110's. After another month's trial she went back to Spiriva and Symbicort because she thought they worked better. She only rarely has any reflux, rarely uses any GERD meds. Cough is better. She rarely needs albuterol.    Review of Systems  Constitutional:  Negative for fever and unexpected weight change.  HENT: Negative for congestion, dental problem, ear pain, nosebleeds, postnasal drip, rhinorrhea, sinus pressure, sneezing, sore throat and trouble swallowing.   Eyes: Negative for redness and itching.  Respiratory: Positive for cough. Negative for chest tightness, shortness of breath and wheezing.   Cardiovascular: Negative for palpitations and leg swelling.  Gastrointestinal: Negative for nausea and vomiting.  Genitourinary: Negative for dysuria.  Musculoskeletal: Negative for joint swelling.  Skin: Negative for rash.  Neurological: Negative for headaches.  Hematological: Does not bruise/bleed easily.  Psychiatric/Behavioral: Negative for dysphoric mood. The patient is not nervous/anxious.      Past Medical History:  Diagnosis Date  . Atrial fibrillation ([place])   . COPD (chronic obstructive pulmonary disease) ([place])   . Hyperlipidemia   . Hypertension      Family History  Problem Relation Age of Onset  . Suicidality Father   . Stroke Maternal Grandfather      Social History   Socioeconomic History  . Marital status: Widowed    Spouse name: Not on file  . Number of children: Not on file  . Years of education: Not on file  . Highest education level: Not on file  Occupational History  . Not on file  Social Needs  . Financial resource strain: Not on file  . Food insecurity:    Worry: Not on file    Inability: Not on file  . Transportation needs:    Medical: Not on file    Non-medical:  Not on file  Tobacco Use  . Smoking status: Former Smoker    Packs/day: 1.50    Years: 30.00    Pack years: 45.00    Last attempt to quit: 11/04/1991    Years since quitting: 26.2  . Smokeless tobacco: Never Used  Substance and Sexual Activity  . Alcohol use: Not on file  . Drug use: No  . Sexual activity: Not on file  Lifestyle  . Physical activity:    Days per week: Not on file    Minutes per session: Not on file  . Stress: Not  on file  Relationships  . Social connections:    Talks on phone: Not on file    Gets together: Not on file    Attends religious service: Not on file    Active member of club or organization: Not on file    Attends meetings of clubs or organizations: Not on file    Relationship status: Not on file  . Intimate partner violence:    Fear of current or ex partner: Not on file    Emotionally abused: Not on file    Physically abused: Not on file    Forced sexual activity: Not on file  Other Topics Concern  . Not on file  Social History Narrative  . 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  No known TB  Allergies  Allergen Reactions  . Lovenox [Enoxaparin Sodium] Hives  . Amlodipine     Muscle pain  . Metoprolol     Muscle aches     Outpatient Medications Prior to Visit  Medication Sig Dispense Refill  . albuterol (PROVENTIL HFA;VENTOLIN HFA) 108 (90 Base) MCG/ACT inhaler Inhale 2 puffs into the lungs every 4 (four) hours as needed for wheezing or shortness of breath. 1 Inhaler 6  . apixaban (ELIQUIS) 5 MG TABS tablet Take 5 mg by mouth 2 (two) times daily.    . budesonide-formoterol (SYMBICORT) 160-4.5 MCG/ACT inhaler Inhale 2 puffs into the lungs 2 (two) times daily.    Marland Kitchen diltiazem (CARDIZEM CD) 240 MG 24 hr capsule Take 240 mg by mouth daily. Takes in AM    . hydrochlorothiazide (MICROZIDE) 12.5 MG capsule TAKE ONE CAPSULE BY MOUTH DAILY (Patient taking differently: TAKE ONE CAPSULE BY MOUTH DAILY in the morning) 60 capsule 11  . losartan (COZAAR) 100 MG tablet Take 100 mg by mouth daily. Takes in AM    . metoprolol succinate (TOPROL XL) 25 MG 24 hr tablet 1/2 tablet by mouth in the evening 30 tablet 6  . pravastatin (PRAVACHOL) 40 MG tablet Take 40 mg by mouth daily.    . Tiotropium Bromide Monohydrate (SPIRIVA RESPIMAT) 2.5 MCG/ACT AERS Inhale 2 puffs into the lungs daily.     No facility-administered medications prior to visit.          Objective:   Physical Exam Vitals:   02/10/18 1414  BP: 126/80  Pulse: 96  SpO2: 96%  Weight: 137 lb (62.1 kg)  Height: 5\' 3"  (1.6 m)   Gen: Pleasant, thin woman, in no distress,  normal affect, mild kyphosis  ENT: No lesions,  mouth clear,  oropharynx clear, no postnasal drip  Neck: No JVD, no stridor  Lungs: No use of accessory muscles, clear bilaterally, no wheeze on forced exp  Cardiovascular: RRR, heart sounds normal, no murmur or gallops, no peripheral edema  Musculoskeletal: No deformities, no cyanosis or clubbing  Neuro: alert, non  focal  Skin: Warm, no lesions or rashes      Assessment & Plan:  Cough No real GERD or cough at this time.   If you are coughing begins to flare remember that this could be related to some silent reflux.  You may want to use your reflux medications under such circumstances.   COPD (chronic obstructive pulmonary disease) ([place]) She felt that the combo of Spiriva and Symbicort was superior to [employer], but she still has some Stiolto at home and she would like to perform another trial to see if she prefers it.  Ok to perform another trial of Stiolto 2 puffs once daily.  If you prefer this medication to your existing combination of Spiriva and Symbicort then please call our office and we will call in a prescription of Stiolto to your pharmacy. Keep albuterol available to use 2 puffs up to every 4 hours if needed for shortness of breath, wheezing, chest tightness. Get the flu shot in the fall. Follow with Dr Lamonte Sakai in 12 months or sooner if you have any problems  Baltazar Apo, MD, PhD 02/10/2018, 2:46 PM [facility] [phone] or if no answer [phone]

## 2018-02-10 NOTE — Patient Instructions (Signed)
Ok to perform another trial of Stiolto 2 puffs once daily.  If you prefer this medication to your existing combination of Spiriva and Symbicort then please call our office and we will call in a prescription of Stiolto to your pharmacy. Keep albuterol available to use 2 puffs up to every 4 hours if needed for shortness of breath, wheezing, chest tightness. If you are coughing begins to flare remember that this could be related to some silent reflux.  You may want to use your reflux medications under such circumstances. Get the flu shot in the fall. Follow with Dr Lamonte Sakai in 12 months or sooner if you have any problems

## 2018-02-11 ENCOUNTER — Telehealth: Payer: Self-pay | Admitting: Internal Medicine

## 2018-02-11 NOTE — Telephone Encounter (Signed)
New Message:    Please call, she wanted you t know the medicine is not working ,her blood pressure and heart rate is still up.She said her heart rate is also irregular half of the time.

## 2018-02-11 NOTE — Telephone Encounter (Signed)
Returned call to patient, patient reports she is continuing to experience high HR in the AM and low HR in the PM.  States her HR has become very irregular (she is taking Eliquis, Hx: afib, aflutter).  She is now taking losartan, cardizem, hctx in the AM and toprol XL in the PM.     4/7 HR 93 AM 61 PM 4/8 HR 117 AM 56 PM 4/9 HR 112 AM 55 PM 4/10 HR 113 AM --(didn't take BP 4/7-4/10) 4/11 HR 111 AM BP this morning 160/99 before medications.   Reports HR is in 100s in the AM and "tanks" in the PM Denies symptoms with increased HR.  States when it "tanks" she becomes SOB and legs ache.    Patient states she use to take digitek instead of cardizem but this was discontinued due to hair loss.  States this was changed and her hair continues to fall out so she doesn't think it was the medication.   Patient wondering if she should possibly change back to digitek.      Advised I would route to Dr. Debara Pickett for further review/recommendations.   (see telephone notes prior, medication changes have been made due to this ongoing issue)

## 2018-02-12 NOTE — Telephone Encounter (Signed)
Follow Up    Patient with HR concerns  STAT if HR is under 50 or over 120 (normal HR is 60-100 beats per minute)  1) What is your heart rate? 113  2) Do you have a log of your heart rate readings (document readings)?   3) Do you have any other symptoms? NO

## 2018-02-12 NOTE — Telephone Encounter (Signed)
Recommendations:  1. HOLD metoprolol for now  2. Take losartan and HCTZ in the morning  3. Take diltiazem at bedtime   4. Continue to monitor BP and HR daily  5. Call clinic if additional problem noted.   We expect to add low dose metoprolol back to regimen but will like to see if low BP & HR reading if afternoon are improved before adding more medication.

## 2018-02-12 NOTE — Telephone Encounter (Signed)
Patient called back to state that she was last seen on 01/12/18 with Dr. Debara Pickett and has a history of afib and a flutter. She is anticoagulated on Eliquis.

## 2018-02-12 NOTE — Telephone Encounter (Signed)
Returned the call to the patient. She has been made aware of the changes and will call back with an update or if she has any concerns.

## 2018-02-12 NOTE — Telephone Encounter (Signed)
Returned the call to the patient. She was calling to get an update on her previous message.  She stated that her heart rate is elevated in the mornings (prior to medications) and "tanks" in the afternoons between 1 and 3 pm. She normally takes her AM medications around 8 am. This has been an ongoing situation for her.   4/8 BP 148/87   HR 117 (prior to medications) BP 105/60   HR 56 (3 pm)  4/9 BP 146/84   HR 112 BP  124/63  HR  55 (3 pm)  She stated that she can feel when her heart rate decreases. She stated that it makes her feel tired, otherwise she is asymptomatic. She would like to know if she needs another medication adjustment.   AM Losartan 100 mg Diltiazem 240 mg HCTZ 12.5 mg  PM Metoprolol (Toprol XL) 12.5 mg

## 2018-02-17 ENCOUNTER — Telehealth: Payer: Self-pay | Admitting: Internal Medicine

## 2018-02-17 NOTE — Telephone Encounter (Signed)
New Message   Pt c/o medication issue:  1. Name of Medication: diltiazem (CARDIZEM CD) 240 MG 24 hr capsule and metoprolol succinate (TOPROL XL) 25 MG 24 hr tablet  2. How are you currently taking this medication (dosage and times per day)? Take 240 mg by mouth daily. Takes in AM and 1/2 tablet by mouth in the evening  3. Are you having a reaction (difficulty breathing--STAT)? no  4. What is your medication issue? Pt is reporting after med change and states she is still having the racing heart and sob

## 2018-02-17 NOTE — Telephone Encounter (Signed)
Patient called in with updated readings on her blood pressure and HR.    02/13/18 am BP 141/97 HR 112   pm BP 147/99 HR 111 02/14/18 am BP 135/74 HR 96   pm BP 126/69 HR 113 02/15/18 am BP 115/79 HR 56   pm BP 181/98 HR 113 02/16/18 am BP 148/79 HR 73   pm BP 139/81 HR 113 02/17/18 am BP 143/74 HR 74  Per patient she has been having more Afib/flutter. She is taking her Eliquis as prescribed.  Has had some weakness in her legs. Did feel good yesterday. Discussed with Raquel Pharm D and will add back the Metoprolol 25 mg 1/2 with dinner and continue to monitor. Advised patient and advised to call back if no improvement or worse. Did schedule follow up with Dr Debara Pickett next available 03/25/18. Will forward to him for review

## 2018-02-17 NOTE — Telephone Encounter (Signed)
I agree, the right thing to do is add more beta blocker MCr

## 2018-02-24 ENCOUNTER — Telehealth: Payer: Self-pay | Admitting: Internal Medicine

## 2018-02-24 DIAGNOSIS — R0602 Shortness of breath: Secondary | ICD-10-CM

## 2018-02-24 DIAGNOSIS — R002 Palpitations: Secondary | ICD-10-CM

## 2018-02-24 NOTE — Telephone Encounter (Signed)
Returned call to patient.   Reports since last medication change 04/17 she now has issues in the AM.    She is taking medications as follows: AM: losartan and HCTZ PM: diltiazem and metoprolol  BP readings:   4/17 AM 143/74 HR 62   PM 137/75 HR 113-experienced a lot of "fluttering"  4/18  AM 121/61 HR 57-experienced fatigue, SOB with exertion, legs felt very heavy.    4/19 AM 113/67 HR 57-fatigue, SOB, legs heavy  PM 130/67 HR 88-symptoms resolved.   4/20  AM 115/58 HR 56-fatigue, SOB, legs heavy  PM 117/64 HR 50-a lot of irregular fluttering, achy  4/21 AM 138/75 HR 51-fatigue, SOB, legs heavy  PM 117/67 HR 95-symptoms resolved  4/22 AM 116/51 HR 50-fatigue, SOB, legs heavy  PM 137/63 HR 83-symptoms resolved   4/23 AM 113/60 HR 57  -only took once this day, she states she felt horrible due to upset stomach  4/24  AM 111/67  She reports she is noticing she feels very fatigued, SOB, and legs heavy when her HR is lower, which is now in the AM.   Symptoms usually last 3-4 hours then resolve.   States this is making for rough mornings.  She is also experiencing a lot of "irregular" heart beats, more than usual.    Routed to MD for review.

## 2018-02-24 NOTE — Telephone Encounter (Signed)
Follow Up:    Please call,concerning adjusting her medications.She said she have been working on this for over a month.

## 2018-02-24 NOTE — Telephone Encounter (Signed)
I would recommend placing a 48 hour monitor to evaluate HR - if she is having significant bradycardia for which she is symptomatic, she may need a pacemaker at some point. Can follow-up with me afterwards.  Dr. Lemmie Evens

## 2018-02-24 NOTE — Telephone Encounter (Signed)
Patient aware and verbalized understanding.  Order placed and message sent to scheduler.

## 2018-02-25 NOTE — Telephone Encounter (Signed)
Patient scheduled for monitor appt on 03/10/18. She has MD follow up on 03/25/18

## 2018-03-04 DIAGNOSIS — J988 Other specified respiratory disorders: Secondary | ICD-10-CM | POA: Diagnosis not present

## 2018-03-04 DIAGNOSIS — R062 Wheezing: Secondary | ICD-10-CM | POA: Diagnosis not present

## 2018-03-09 ENCOUNTER — Telehealth: Payer: Self-pay | Admitting: Internal Medicine

## 2018-03-09 NOTE — Telephone Encounter (Signed)
Spoke with Pt who states her symptoms has resolved and wanted to know if she still need to wear the 48 hr holter monitor. Pt advised to continue to proceed with schedule just to monitor symptoms for 48 hr. Pt verbalized understanding.

## 2018-03-09 NOTE — Telephone Encounter (Signed)
New Message:    Pt said she is scheduled to get a monitor tomorrow. She have not had any episodes of her heart rate dropping for the past  4 days and blood pressure have been good also.She was wondering if Dr Debara Pickett still thought she need to wear the monitor?

## 2018-03-10 ENCOUNTER — Ambulatory Visit (INDEPENDENT_AMBULATORY_CARE_PROVIDER_SITE_OTHER): Payer: Medicare Other

## 2018-03-10 DIAGNOSIS — R002 Palpitations: Secondary | ICD-10-CM

## 2018-03-10 DIAGNOSIS — R0602 Shortness of breath: Secondary | ICD-10-CM

## 2018-03-11 ENCOUNTER — Telehealth: Payer: Self-pay | Admitting: Internal Medicine

## 2018-03-11 MED ORDER — LOSARTAN POTASSIUM 100 MG PO TABS: 100.0000 mg | ORAL_TABLET | Freq: Every day | ORAL | 3 refills | Status: DC

## 2018-03-11 NOTE — Telephone Encounter (Signed)
Left message to call back  

## 2018-03-11 NOTE — Telephone Encounter (Signed)
°*  STAT* If patient is at the pharmacy, call can be transferred to refill team.   1. Which medications need to be refilled? (please list name of each medication and dose if known)  She needs a new prescription for her Losartan Potassium 2. Which pharmacy/location (including street and city if local pharmacy) is medication to be sent to?[facility] [phone]  3. Do they need a 30 day or 90 day supply? 100 and refills*

## 2018-03-11 NOTE — Telephone Encounter (Signed)
Spoke with pt, Refill sent to the pharmacy electronically.  

## 2018-03-22 ENCOUNTER — Other Ambulatory Visit: Payer: Self-pay | Admitting: Pharmacist Clinician (PhC)/ Clinical Pharmacy Specialist

## 2018-03-22 MED ORDER — APIXABAN 5 MG PO TABS: 5.0000 mg | ORAL_TABLET | Freq: Two times a day (BID) | ORAL | 1 refills | Status: DC

## 2018-03-25 ENCOUNTER — Encounter: Payer: Self-pay | Admitting: Internal Medicine

## 2018-03-25 ENCOUNTER — Ambulatory Visit (INDEPENDENT_AMBULATORY_CARE_PROVIDER_SITE_OTHER): Payer: Medicare Other | Admitting: Internal Medicine

## 2018-03-25 VITALS — BP 112/62 | HR 63 | Ht 63.0 in | Wt 131.0 lb

## 2018-03-25 DIAGNOSIS — I483 Typical atrial flutter: Secondary | ICD-10-CM

## 2018-03-25 DIAGNOSIS — I4891 Unspecified atrial fibrillation: Secondary | ICD-10-CM | POA: Diagnosis not present

## 2018-03-25 DIAGNOSIS — I10 Essential (primary) hypertension: Secondary | ICD-10-CM | POA: Diagnosis not present

## 2018-03-25 DIAGNOSIS — R002 Palpitations: Secondary | ICD-10-CM

## 2018-03-25 DIAGNOSIS — R0602 Shortness of breath: Secondary | ICD-10-CM

## 2018-03-25 NOTE — Progress Notes (Signed)
OFFICE NOTE  Chief Complaint:  Follow-up monitor  Primary Care Physician: London Pepper, MD  HPI:  Holly Benson is a 84 y.o. female with a past medial history significant for atrial fibrillation, COPD, hypertension and dyslipidemia.  She is originally from [place] but has been living in [place].  She was established and treated by Dr. Gloriann Loan, a cardiac electrophysiologist with [facility].  She underwent electrical cardioversion and was offered an A. fib ablation, but preferred medical therapy.  She has been on diltiazem and Eliquis as well as losartan for hypertension.  She also takes pravastatin and Spiriva for her COPD.  She denies any chest pain or worsening shortness of breath.  She reports she has had a stress test and an echo in the past, neither which were remarkable.  We will try to obtain those records from her prior cardiologist.  Recently she has noted some increase in palpitations and feeling like her heart is pounding.  This is mostly at night when she is trying to go to bed.  She says it can happen several times a week, but it certainly not daily.  11/10/2017  Holly Benson returns today for follow-up.  She underwent monitoring which demonstrated sinus rhythm with PACs and PVCs.  There were some periods of sinus tachycardia for which correlated with her palpitations.  No evidence of atrial fibrillation was noted.  She says she continues to have episodes of hard heartbeats and feeling like her heart is pounding.  She discussed this further with her daughter-in-law over the holidays who is a Librarian, academic.  I was provided with a letter which indicated all of her symptoms including twice a week where she wakes up in the middle night feeling suffocated.  She has had episodes of reactive hypoglycemia in the past that would cause elevated heart rate however blood sugars were normal this week.  She went to the ER on December 10 for elevated heart rate,  lightheadedness and elevated blood pressure however workup was unremarkable.  There was a concern for possible dehydration.  In the past her echo in [place] showed a EF was around 40-45% however she said that was more than 5 years ago.  LV function has not been reassessed.  01/12/2018  Holly Benson returns today for follow-up.  When I last saw her I placed her on low-dose metoprolol for additional rate control.  Today she is noted to be in atrial flutter with variable ventricular response and PVCs at 79.  When I walked in I asked her how she was feeling and she said she felt great.  She is asymptomatic with this and was not aware that she was out of rhythm.  Interestingly on her monitor she only showed some PACs and PVCs with predominantly sinus rhythm but was having symptoms of palpitations.  It does not seem that her symptoms necessarily correlate with her arrhythmias.  This seems to be the case when I reviewed her records from [place].  She had both A. fib and a flutter was initially considered for flutter ablation however with her concomitant A. fib this was not pursued.  She also has had chest discomfort symptoms and underwent exercise treadmill testing in January 2017 which was negative for ischemia.  LVEF was 40-45% in the past however based on a repeat echo we performed this past month, her LVEF is now 60-65%.  She does have biatrial enlargement.  03/25/2018  Holly Benson was seen today in follow-up.  She underwent monitoring which  showed sinus bradycardia, sinus arrhythmia, intermittent first-degree AV block and junctional rhythm with pauses greater than 2 seconds but less than 3 seconds.  There was short periods of second-degree AV block type II.  Ventricular couplets atrial pairs runs and trigeminy were all noted.  There is no significant atrial fibrillation or ventricular fibrillation.  No atrial flutter was noted.  She reports improvement in her symptoms after adjustment of her beta-blocker and calcium channel  blocker.  Overall she is pleased with her symptoms at this time.  PMHx:  Past Medical History:  Diagnosis Date  . Atrial fibrillation ([place])   . COPD (chronic obstructive pulmonary disease) ([place])   . Hyperlipidemia   . Hypertension     No past surgical history on file.  FAMHx:  Family History  Problem Relation Age of Onset  . Suicidality Father   . Stroke Maternal Grandfather     SOCHx:   reports that she quit smoking about 26 years ago. She has a 45.00 pack-year smoking history. She has never used smokeless tobacco. She reports that she does not use drugs. Her alcohol history is not on file.  ALLERGIES:  Allergies  Allergen Reactions  . Lovenox [Enoxaparin Sodium] Hives  . Amlodipine     Muscle pain  . Metoprolol     Muscle aches    ROS: Pertinent items noted in HPI and remainder of comprehensive ROS otherwise negative.  HOME MEDS: Current Outpatient Medications on File Prior to Visit  Medication Sig Dispense Refill  . albuterol (PROVENTIL HFA;VENTOLIN HFA) 108 (90 Base) MCG/ACT inhaler Inhale 2 puffs into the lungs every 4 (four) hours as needed for wheezing or shortness of breath. 1 Inhaler 6  . apixaban (ELIQUIS) 5 MG TABS tablet Take 1 tablet (5 mg total) by mouth 2 (two) times daily. 180 tablet 1  . diltiazem (CARDIZEM CD) 240 MG 24 hr capsule Take 240 mg by mouth daily. Takes in AM    . hydrochlorothiazide (MICROZIDE) 12.5 MG capsule TAKE ONE CAPSULE BY MOUTH DAILY (Patient taking differently: TAKE ONE CAPSULE BY MOUTH DAILY in the morning) 60 capsule 11  . losartan (COZAAR) 100 MG tablet Take 1 tablet (100 mg total) by mouth daily. Takes in AM 90 tablet 3  . metoprolol succinate (TOPROL XL) 25 MG 24 hr tablet 1/2 tablet by mouth in the evening 30 tablet 6  . pravastatin (PRAVACHOL) 40 MG tablet Take 40 mg by mouth daily.    . Tiotropium Bromide-Olodaterol (STIOLTO RESPIMAT) 2.5-2.5 MCG/ACT AERS Inhale 2 puffs into the lungs every morning.     No current  facility-administered medications on file prior to visit.     LABS/IMAGING: No results found for this or any previous visit (from the past 48 hour(s)). No results found.  LIPID PANEL: No results found for: CHOL, TRIG, HDL, CHOLHDL, VLDL, LDLCALC, LDLDIRECT   WEIGHTS: Wt Readings from Last 3 Encounters:  03/25/18 131 lb (59.4 kg)  02/10/18 137 lb (62.1 kg)  01/12/18 138 lb 6.4 oz (62.8 kg)    VITALS: BP 112/62   Pulse 63   Ht 5\' 3"  (1.6 m)   Wt 131 lb (59.4 kg)   BMI 23.21 kg/m   EXAM: Deferred  EKG: Deferred  ASSESSMENT: 1. History of atrial fibrillation, status post DCCV 2. Paroxysmal atrial flutter 3. Palpitations 4. CHADSVASC score of 4 - on Eliquis 5. Hypertension  6. Dyslipidemia  PLAN: 1.   Mrs. Fitzmaurice was found to have a number of PACs, PVCs and first and  second-degree type II AV block on her EKG however she has not been symptomatic with this.  She had 2 pauses that were slightly greater than 2 seconds.  Both of these were nocturnal.  Overall she is feeling well on her current regimen.  There was no recurrent flutter or fib noted although she remains on Eliquis.  Her CHADSVASC score is 4.  We will continue her current medications.  I do believe she has a degree of sick sinus syndrome, and may need pacemaker in the future if she continues to have features of tachycardia-bradycardia syndrome.  Follow-up with me in 6 months or sooner as necessary.  Pixie Casino, MD, [facility], [place] Director of the Advanced Lipid Disorders &  Cardiovascular Risk Reduction Clinic Diplomate of the American Board of Clinical Lipidology Attending Cardiologist  Direct Dial: [phone]  Fax: [phone]  Website:  [email or web address] 03/25/2018, 9:16 AM

## 2018-03-25 NOTE — Patient Instructions (Signed)
Your physician wants you to follow-up in: 6 months with Dr. Hilty. You will receive a reminder letter in the mail two months in advance. If you don't receive a letter, please call our office to schedule the follow-up appointment.    

## 2018-04-19 ENCOUNTER — Telehealth: Payer: Self-pay | Admitting: Internal Medicine

## 2018-04-19 NOTE — Telephone Encounter (Signed)
Returned call to patient of Dr. Debara Pickett. She reports she is having more "irregularities" and palpitations (ectopy as described per holter monitor). She states she was told by Dr. Debara Pickett that a pacemaker may be needed in the future to help her symptoms. She reports her symptoms were worse over the weekend, but OK yesterday and today. Her BP has been on the "low side" in the 110s/60s and her HR ranges 56-72 and averages in the 60s. She should like to know if she should continue toprol 12.5mg  QD, as she states she did not get a refill of this - was unsure if this was just to "try" the medication to see if if helped her, which she is doing fine on right now. She would like MD to review her concerns and provide any recommendations. She is going out of town Wednesday 6/19 PM for 10 days.   Routed to MD

## 2018-04-19 NOTE — Telephone Encounter (Signed)
Pt calling  Pt want to speak to nurse about some medication adjustments. Please call pt.

## 2018-04-20 NOTE — Telephone Encounter (Signed)
I would refill the Toprol and continue at the current dose. We can discuss further when she returns from her travels.  Dr. Lemmie Evens

## 2018-04-20 NOTE — Telephone Encounter (Signed)
Patient aware of MD advice.  ?

## 2018-05-04 ENCOUNTER — Other Ambulatory Visit: Payer: Self-pay | Admitting: Internal Medicine

## 2018-05-04 ENCOUNTER — Telehealth: Payer: Self-pay | Admitting: Emergency Medicine

## 2018-05-04 DIAGNOSIS — I429 Cardiomyopathy, unspecified: Secondary | ICD-10-CM

## 2018-05-04 MED ORDER — TIOTROPIUM BROMIDE-OLODATEROL 2.5-2.5 MCG/ACT IN AERS: 2.0000 | INHALATION_SPRAY | Freq: Every morning | RESPIRATORY_TRACT | 3 refills | Status: DC

## 2018-05-04 NOTE — Telephone Encounter (Signed)
Called and spoke with patient regarding stiolto rx Pt likes the stiolto samples, and is benefiting well from this inhaler. Pt last seen RB 02/26/18 RX is expired. Resent the RX for Stiolto today to [facility] pharmacy mail order for 90 day supply. Nothing further needed at this time.

## 2018-05-10 ENCOUNTER — Other Ambulatory Visit: Payer: Self-pay | Admitting: *Deleted

## 2018-05-10 MED ORDER — TIOTROPIUM BROMIDE-OLODATEROL 2.5-2.5 MCG/ACT IN AERS: 2.0000 | INHALATION_SPRAY | Freq: Every morning | RESPIRATORY_TRACT | 3 refills | Status: DC

## 2018-05-14 ENCOUNTER — Telehealth: Payer: Self-pay | Admitting: Internal Medicine

## 2018-05-14 NOTE — Telephone Encounter (Signed)
Patient called w/MD recommendations. She will monitor symptoms. Advised she can schedule MD OV sooner than 6 mont visit is due (Nov 2019). She will wait to see how she is doing while on her trip and call back if needed.

## 2018-05-14 NOTE — Telephone Encounter (Signed)
Returned call to patient of Dr. Debara Pickett. She reports her BP and HR seem to be good, but on the low side. She reports her irregular heart beats seem to be increasing. She notices irregular heart beat throughout the day off and on and it is worse at night. The palpitations have woken her up at night. She does not check BP/HR during these episodes. She is planning to go out of town for a few weeks and wanted to call in to our office to check up on symptoms.   She reports her metoprolol succinate is starting to cause her asthma-like symptoms, which she has in the past. She would like to know if there is an alternative to metoprolol succinate that would be just as effective for her.   Patient has had difficult to manage BP/HR and palpitations per many phone notes in spring 2019.   Advised would notify MD covering for Dr. Debara Pickett to review and advise

## 2018-05-14 NOTE — Telephone Encounter (Signed)
Patient c/o Palpitations:  High priority if patient c/o lightheadedness, shortness of breath, or chest pain  1) How long have you had palpitations/irregular HR/ Afib? Are you having the symptoms now? Yes she has Palpitations and Irregular HR- Not at moment-but last night they were rather bad 2) Are you currently experiencing lightheadedness, SOB or CP? no  3) Do you have a history of afib (atrial fibrillation) or irregular heart rhythm? Yes  4) Have you checked your BP or HR? (document readings if available)Blood pressure have been a little low lately and heart rate been running in the sixties  5) Are you experiencing any other symptoms? Extremely tired in the afternoon

## 2018-05-14 NOTE — Telephone Encounter (Signed)
Would continue present regimen; if toprol DCed, palpitations will likely worsen; cannot advance dose due to dyspnea and h/o mobitz 1. FU Dr Leonides Cave

## 2018-06-07 ENCOUNTER — Encounter: Payer: Self-pay | Admitting: Internal Medicine

## 2018-06-23 DIAGNOSIS — K5900 Constipation, unspecified: Secondary | ICD-10-CM | POA: Diagnosis not present

## 2018-06-23 DIAGNOSIS — L989 Disorder of the skin and subcutaneous tissue, unspecified: Secondary | ICD-10-CM | POA: Diagnosis not present

## 2018-07-23 ENCOUNTER — Telehealth: Payer: Self-pay | Admitting: Internal Medicine

## 2018-07-23 NOTE — Telephone Encounter (Signed)
New Message:    Pt says she wants to take this trip, but have concerns because of the high altitude. Does she need to takne any precautions or should she take the trip at all?

## 2018-07-23 NOTE — Telephone Encounter (Signed)
Returned call to patient, patient states she has a planned trip in a few weeks to [facility] and the elevation is 6200 ft.  She will be flying.  She wanted Dr. Debara Pickett to know and see if there was any thought/advice/recommendations for her travels and in regards to the elevation.     She also went to get her ears pierced the other day and they would not do it because she is on Eliquis.   She needed approval from her MD.      Routed to MD to review.

## 2018-07-23 NOTE — Telephone Encounter (Signed)
Probable could bleed more on Eliquis. She could hold it 2 days prior to piercing if she really wants it done.  Dr. Lemmie Evens

## 2018-07-23 NOTE — Telephone Encounter (Signed)
No concerns about the altitude with her heart - she should know the air is "thinner" so this may make it harder to breathe with her COPD.   Dr. Lemmie Evens

## 2018-07-26 NOTE — Telephone Encounter (Signed)
Patient called with MD recommendations/advice concerning trip to [place] & ear piercing. She voiced understanding.

## 2018-08-03 ENCOUNTER — Telehealth: Payer: Self-pay | Admitting: Emergency Medicine

## 2018-08-03 NOTE — Telephone Encounter (Signed)
I agree with this advice.  As of her office visit 1 year ago she did not desaturate with exertion.  She may notice some desaturations at altitude and if so she needs to stop to rest.

## 2018-08-03 NOTE — Telephone Encounter (Signed)
Called and talked to patient.  She states she is traveling to the [facility] for a week during the 3rd week of October and she was concerned with the elevations if there were any precautions she needed to pay attention to.  I instructed her to make sure she carried her rescue inhaler with her and that she purchase a pulse ox to carry with her and if her O2 dropped to 88 or below to stop take good deep breaths if no improvements to call for help. Patient also aware to call if her signs and symptoms worsen while on trip to call us.   If anything more needs to be addressed with patient please advise Dr. Lamonte Sakai.

## 2018-08-03 NOTE — Telephone Encounter (Signed)
Patient is aware, called and spoke with patient. Nothing further needed.

## 2018-08-30 ENCOUNTER — Encounter: Payer: Self-pay | Admitting: Emergency Medicine

## 2018-08-30 ENCOUNTER — Ambulatory Visit (INDEPENDENT_AMBULATORY_CARE_PROVIDER_SITE_OTHER): Payer: Medicare Other | Admitting: Emergency Medicine

## 2018-08-30 DIAGNOSIS — R05 Cough: Secondary | ICD-10-CM

## 2018-08-30 DIAGNOSIS — J449 Chronic obstructive pulmonary disease, unspecified: Secondary | ICD-10-CM

## 2018-08-30 DIAGNOSIS — R059 Cough, unspecified: Secondary | ICD-10-CM

## 2018-08-30 NOTE — Patient Instructions (Addendum)
Please continue Stiolto 2 puffs once daily as you have been taking it. Keep albuterol available use 2 puffs if needed for shortness of breath, chest tightness, wheezing. Flu and Pneumonia shots are up to date. You might want to consider using Loratadine daily if your nasal congestion does not improve. Follow up with Dr. Lamonte Sakai in 6 months or sooner if needed.

## 2018-08-30 NOTE — Assessment & Plan Note (Signed)
She benefited from the [place], prefers it to [employer] and Symbicort.  We will continue this.  Please continue Stiolto 2 puffs once daily as you have been taking it. Keep albuterol available use 2 puffs if needed for shortness of breath, chest tightness, wheezing. Flu and Pneumonia shots are up to date.

## 2018-08-30 NOTE — Progress Notes (Signed)
Subjective:    Patient ID: Holly Benson, female    DOB: 15-Dec-1932, 84 y.o.   MRN: [id number]  COPD  She complains of cough. There is no shortness of breath or wheezing. Pertinent negatives include no ear pain, fever, headaches, postnasal drip, rhinorrhea, sneezing, sore throat or trouble swallowing. Her past medical history is significant for COPD.    ROV 02/10/18 --patient has a history of tobacco use (45 pack years) and associated COPD.  She also has chronic cough that is impacted by occasional GERD.  She is followed elsewhere for hypertension and atrial fibrillation. She reports today that she has been having periods of shakiness in the afternoons, has been having her meds adjusted including her metoprolol. She measures her BP at these times (usually 3-4pm), notes that it is 100's SBP, HR in the 110's. After another month's trial she went back to Spiriva and Symbicort because she thought they worked better. She only rarely has any reflux, rarely uses any GERD meds. Cough is better. She rarely needs albuterol.   ROV 08/30/18 --84 year old woman who follows up today for her history of COPD and chronic cough.  This in the setting of GERD, some allergic rhinitis.  She also has hypertension, atrial fibrillation.  She has been on Spiriva/Symbicort in the past, most recently trial of Stiolto. She prefers the [employer], is doing well on it.   She was able to travel to [place] recently, did well, had some increased Albuterol use there. Her GERD appears to be controlled w diet. No flares since last time. She uses albuterol. She is having a lot of PND, especially at night. She hopes to avoid meds for this. She has had PNA vaccine in the past, in [place].    Review of Systems  Constitutional: Negative for fever and unexpected weight change.  HENT: Negative for congestion, dental problem, ear pain, nosebleeds, postnasal drip, rhinorrhea, sinus pressure, sneezing, sore throat and trouble swallowing.   Eyes:  Negative for redness and itching.  Respiratory: Positive for cough. Negative for chest tightness, shortness of breath and wheezing.   Cardiovascular: Negative for palpitations and leg swelling.  Gastrointestinal: Negative for nausea and vomiting.  Genitourinary: Negative for dysuria.  Musculoskeletal: Negative for joint swelling.  Skin: Negative for rash.  Neurological: Negative for headaches.  Hematological: Does not bruise/bleed easily.  Psychiatric/Behavioral: Negative for dysphoric mood. The patient is not nervous/anxious.     Past Medical History:  Diagnosis Date  . Atrial fibrillation ([place])   . COPD (chronic obstructive pulmonary disease) ([place])   . Hyperlipidemia   . Hypertension      Family History  Problem Relation Age of Onset  . Suicidality Father   . Stroke Maternal Grandfather      Social History   Socioeconomic History  . Marital status: Widowed    Spouse name: Not on file  . Number of children: Not on file  . Years of education: Not on file  . Highest education level: Not on file  Occupational History  . Not on file  Social Needs  . Financial resource strain: Not on file  . Food insecurity:    Worry: Not on file    Inability: Not on file  . Transportation needs:    Medical: Not on file    Non-medical: Not on file  Tobacco Use  . Smoking status: Former Smoker    Packs/day: 1.50    Years: 30.00    Pack years: 45.00    Last attempt to quit:  11/04/1991    Years since quitting: 26.8  . Smokeless tobacco: Never Used  Substance and Sexual Activity  . Alcohol use: Not on file  . Drug use: No  . Sexual activity: Not on file  Lifestyle  . Physical activity:    Days per week: Not on file    Minutes per session: Not on file  . Stress: Not on file  Relationships  . Social connections:    Talks on phone: Not on file    Gets together: Not on file    Attends religious service: Not on file    Active member of club or organization: Not on file    Attends  meetings of clubs or organizations: Not on file    Relationship status: Not on file  . Intimate partner violence:    Fear of current or ex partner: Not on file    Emotionally abused: Not on file    Physically abused: Not on file    Forced sexual activity: Not on file  Other Topics Concern  . Not on file  Social History Narrative  . 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  No known TB  Allergies  Allergen Reactions  . Lovenox [Enoxaparin Sodium] Hives  . Amlodipine     Muscle pain  . Metoprolol     Muscle aches     Outpatient Medications Prior to Visit  Medication Sig Dispense Refill  . albuterol (PROVENTIL HFA;VENTOLIN HFA) 108 (90 Base) MCG/ACT inhaler Inhale 2 puffs into the lungs every 4 (four) hours as needed for wheezing or shortness of breath. 1 Inhaler 6  . apixaban (ELIQUIS) 5 MG TABS tablet Take 1 tablet (5 mg total) by mouth 2 (two) times daily. 180 tablet 1  . diltiazem (CARDIZEM CD) 240 MG 24 hr capsule Take 240 mg by mouth daily. Takes in AM    . hydrochlorothiazide (MICROZIDE) 12.5 MG capsule TAKE ONE CAPSULE BY MOUTH DAILY (Patient taking differently: TAKE ONE CAPSULE BY MOUTH DAILY in the morning) 60 capsule 11  . losartan (COZAAR) 100 MG tablet Take 1 tablet (100 mg total) by mouth daily. Takes in AM 90 tablet 3  . metoprolol succinate (TOPROL-XL) 25 MG 24 hr tablet TAKE 1/2 TABLET BY MOUTH DAILY 30 tablet 6  . pravastatin (PRAVACHOL) 40 MG tablet Take 40 mg by mouth daily.    . Tiotropium Bromide-Olodaterol (STIOLTO RESPIMAT) 2.5-2.5 MCG/ACT AERS Inhale 2 puffs into the lungs every morning. 3 Inhaler 3  . metoprolol succinate (TOPROL XL) 25 MG 24 hr tablet 1/2 tablet by mouth in the evening 30 tablet 6   No facility-administered medications prior to visit.         Objective:   Physical Exam Vitals:   08/30/18 0954  BP: 126/60  Pulse: 60  SpO2: 93%  Weight: 139 lb (63 kg)  Height: 5\' 3"  (1.6 m)   Gen:  Pleasant, thin woman, in no distress,  normal affect, mild kyphosis  ENT: No lesions,  mouth clear,  oropharynx clear, no postnasal drip  Neck: No JVD, no stridor  Lungs: No use of accessory muscles, clear bilaterally, no wheeze on forced exp  Cardiovascular: RRR, heart sounds normal, no murmur or gallops, no peripheral edema  Musculoskeletal: No deformities, no cyanosis or clubbing  Neuro: alert, non focal  Skin: Warm, no lesions or rashes      Assessment & Plan:  Cough Fairly well-controlled at this time, likely because her  GERD is better managed as well.  She is doing this with diet.  She does continue to have a lot of postnasal drainage which is also a contributor.  We talked today about possibly starting an allergy regimen if this becomes more bothersome, if cough increases.  COPD (chronic obstructive pulmonary disease) ([place]) She benefited from the [place], prefers it to [employer] and Symbicort.  We will continue this.  Please continue Stiolto 2 puffs once daily as you have been taking it. Keep albuterol available use 2 puffs if needed for shortness of breath, chest tightness, wheezing. Flu and Pneumonia shots are up to date.  Baltazar Apo, MD, PhD 08/30/2018, 11:05 AM [facility] [phone] or if no answer [phone]

## 2018-08-30 NOTE — Assessment & Plan Note (Signed)
Fairly well-controlled at this time, likely because her GERD is better managed as well.  She is doing this with diet.  She does continue to have a lot of postnasal drainage which is also a contributor.  We talked today about possibly starting an allergy regimen if this becomes more bothersome, if cough increases.

## 2018-09-02 DIAGNOSIS — F410 Panic disorder [episodic paroxysmal anxiety] without agoraphobia: Secondary | ICD-10-CM | POA: Diagnosis not present

## 2018-09-02 DIAGNOSIS — I10 Essential (primary) hypertension: Secondary | ICD-10-CM | POA: Diagnosis not present

## 2018-09-02 DIAGNOSIS — R197 Diarrhea, unspecified: Secondary | ICD-10-CM | POA: Diagnosis not present

## 2018-09-02 DIAGNOSIS — J449 Chronic obstructive pulmonary disease, unspecified: Secondary | ICD-10-CM | POA: Diagnosis not present

## 2018-09-02 DIAGNOSIS — E785 Hyperlipidemia, unspecified: Secondary | ICD-10-CM | POA: Diagnosis not present

## 2018-09-02 DIAGNOSIS — Z8679 Personal history of other diseases of the circulatory system: Secondary | ICD-10-CM | POA: Diagnosis not present

## 2018-09-02 DIAGNOSIS — Z780 Asymptomatic menopausal state: Secondary | ICD-10-CM | POA: Diagnosis not present

## 2018-09-02 DIAGNOSIS — Z0000 Encounter for general adult medical examination without abnormal findings: Secondary | ICD-10-CM | POA: Diagnosis not present

## 2018-09-07 ENCOUNTER — Other Ambulatory Visit: Payer: Self-pay | Admitting: Family Medicine

## 2018-09-07 DIAGNOSIS — E2839 Other primary ovarian failure: Secondary | ICD-10-CM

## 2018-09-22 ENCOUNTER — Encounter: Payer: Self-pay | Admitting: Internal Medicine

## 2018-09-22 ENCOUNTER — Ambulatory Visit (INDEPENDENT_AMBULATORY_CARE_PROVIDER_SITE_OTHER): Payer: Medicare Other | Admitting: Internal Medicine

## 2018-09-22 VITALS — BP 136/80 | HR 65 | Ht 63.0 in | Wt 145.2 lb

## 2018-09-22 DIAGNOSIS — I480 Paroxysmal atrial fibrillation: Secondary | ICD-10-CM | POA: Diagnosis not present

## 2018-09-22 DIAGNOSIS — I10 Essential (primary) hypertension: Secondary | ICD-10-CM

## 2018-09-22 DIAGNOSIS — G4719 Other hypersomnia: Secondary | ICD-10-CM | POA: Insufficient documentation

## 2018-09-22 MED ORDER — APIXABAN 5 MG PO TABS: 5.0000 mg | ORAL_TABLET | Freq: Two times a day (BID) | ORAL | 1 refills | Status: DC

## 2018-09-22 NOTE — Progress Notes (Signed)
OFFICE NOTE  Chief Complaint:  Fatigue  Primary Care Physician: London Pepper, MD  HPI:  Holly Benson is a 84 y.o. female with a past medial history significant for atrial fibrillation, COPD, hypertension and dyslipidemia.  She is originally from [place] but has been living in [place].  She was established and treated by Dr. Gloriann Loan, a cardiac electrophysiologist with [facility].  She underwent electrical cardioversion and was offered an A. fib ablation, but preferred medical therapy.  She has been on diltiazem and Eliquis as well as losartan for hypertension.  She also takes pravastatin and Spiriva for her COPD.  She denies any chest pain or worsening shortness of breath.  She reports she has had a stress test and an echo in the past, neither which were remarkable.  We will try to obtain those records from her prior cardiologist.  Recently she has noted some increase in palpitations and feeling like her heart is pounding.  This is mostly at night when she is trying to go to bed.  She says it can happen several times a week, but it certainly not daily.  11/10/2017  Holly Benson returns today for follow-up.  She underwent monitoring which demonstrated sinus rhythm with PACs and PVCs.  There were some periods of sinus tachycardia for which correlated with her palpitations.  No evidence of atrial fibrillation was noted.  She says she continues to have episodes of hard heartbeats and feeling like her heart is pounding.  She discussed this further with her daughter-in-law over the holidays who is a Librarian, academic.  I was provided with a letter which indicated all of her symptoms including twice a week where she wakes up in the middle night feeling suffocated.  She has had episodes of reactive hypoglycemia in the past that would cause elevated heart rate however blood sugars were normal this week.  She went to the ER on December 10 for elevated heart rate, lightheadedness  and elevated blood pressure however workup was unremarkable.  There was a concern for possible dehydration.  In the past her echo in [place] showed a EF was around 40-45% however she said that was more than 5 years ago.  LV function has not been reassessed.  01/12/2018  Holly Benson returns today for follow-up.  When I last saw her I placed her on low-dose metoprolol for additional rate control.  Today she is noted to be in atrial flutter with variable ventricular response and PVCs at 79.  When I walked in I asked her how she was feeling and she said she felt great.  She is asymptomatic with this and was not aware that she was out of rhythm.  Interestingly on her monitor she only showed some PACs and PVCs with predominantly sinus rhythm but was having symptoms of palpitations.  It does not seem that her symptoms necessarily correlate with her arrhythmias.  This seems to be the case when I reviewed her records from [place].  She had both A. fib and a flutter was initially considered for flutter ablation however with her concomitant A. fib this was not pursued.  She also has had chest discomfort symptoms and underwent exercise treadmill testing in January 2017 which was negative for ischemia.  LVEF was 40-45% in the past however based on a repeat echo we performed this past month, her LVEF is now 60-65%.  She does have biatrial enlargement.  03/25/2018  Holly Benson was seen today in follow-up.  She underwent monitoring which showed  sinus bradycardia, sinus arrhythmia, intermittent first-degree AV block and junctional rhythm with pauses greater than 2 seconds but less than 3 seconds.  There was short periods of second-degree AV block type II.  Ventricular couplets atrial pairs runs and trigeminy were all noted.  There is no significant atrial fibrillation or ventricular fibrillation.  No atrial flutter was noted.  She reports improvement in her symptoms after adjustment of her beta-blocker and calcium channel blocker.  Overall  she is pleased with her symptoms at this time.  09/22/2018  Holly Benson is seen today for routine follow-up.  She again has some complaints of fatigue.  She is concerned this may be related to her medications.  She has not noted to have any significant bradycardia today in fact heart rates in the 60s with a solitary PVC on EKG.  She reports compliance with medicines her blood pressures been generally well controlled.  There is no evidence of A. fib today.  She is had no bleeding problems on Eliquis.  She is due for a refill of that medication.  She does report fatigue and difficulty staying asleep at night.  She is not noted to snore.  She denies any morning headaches but does oftentimes have to nap during the day.  PMHx:  Past Medical History:  Diagnosis Date  . Atrial fibrillation ([place])   . COPD (chronic obstructive pulmonary disease) ([place])   . Hyperlipidemia   . Hypertension     No past surgical history on file.  FAMHx:  Family History  Problem Relation Age of Onset  . Suicidality Father   . Stroke Maternal Grandfather     SOCHx:   reports that she quit smoking about 26 years ago. She has a 45.00 pack-year smoking history. She has never used smokeless tobacco. She reports that she does not use drugs. Her alcohol history is not on file.  ALLERGIES:  Allergies  Allergen Reactions  . Lovenox [Enoxaparin Sodium] Hives  . Amlodipine     Muscle pain  . Metoprolol     Muscle aches    ROS: Pertinent items noted in HPI and remainder of comprehensive ROS otherwise negative.  HOME MEDS: Current Outpatient Medications on File Prior to Visit  Medication Sig Dispense Refill  . albuterol (PROVENTIL HFA;VENTOLIN HFA) 108 (90 Base) MCG/ACT inhaler Inhale 2 puffs into the lungs every 4 (four) hours as needed for wheezing or shortness of breath. 1 Inhaler 6  . apixaban (ELIQUIS) 5 MG TABS tablet Take 1 tablet (5 mg total) by mouth 2 (two) times daily. 180 tablet 1  . diltiazem (CARDIZEM  CD) 240 MG 24 hr capsule Take 240 mg by mouth daily.     . hydrochlorothiazide (MICROZIDE) 12.5 MG capsule TAKE ONE CAPSULE BY MOUTH DAILY (Patient taking differently: TAKE ONE CAPSULE BY MOUTH DAILY in the morning) 60 capsule 11  . losartan (COZAAR) 100 MG tablet Take 1 tablet (100 mg total) by mouth daily. Takes in AM 90 tablet 3  . metoprolol succinate (TOPROL-XL) 25 MG 24 hr tablet TAKE 1/2 TABLET BY MOUTH DAILY 30 tablet 6  . pravastatin (PRAVACHOL) 40 MG tablet Take 40 mg by mouth daily.    . Tiotropium Bromide-Olodaterol (STIOLTO RESPIMAT) 2.5-2.5 MCG/ACT AERS Inhale 2 puffs into the lungs every morning. 3 Inhaler 3   No current facility-administered medications on file prior to visit.     LABS/IMAGING: No results found for this or any previous visit (from the past 48 hour(s)). No results found.  LIPID PANEL:  No results found for: CHOL, TRIG, HDL, CHOLHDL, VLDL, LDLCALC, LDLDIRECT   WEIGHTS: Wt Readings from Last 3 Encounters:  09/22/18 145 lb 3.2 oz (65.9 kg)  08/30/18 139 lb (63 kg)  03/25/18 131 lb (59.4 kg)    VITALS: BP 136/80   Pulse 65   Ht 5\' 3"  (1.6 m)   Wt 145 lb 3.2 oz (65.9 kg)   BMI 25.72 kg/m   EXAM: General appearance: alert and no distress Neck: no carotid bruit, no JVD and thyroid not enlarged, symmetric, no tenderness/mass/nodules Lungs: clear to auscultation bilaterally Heart: regular rate and rhythm Abdomen: soft, non-tender; bowel sounds normal; no masses,  no organomegaly Extremities: extremities normal, atraumatic, no cyanosis or edema Pulses: 2+ and symmetric Skin: Skin color, texture, turgor normal. No rashes or lesions Neurologic: Grossly normal Psych: Pleasant  EKG: Sinus with PVCs at 65, possible LAE, incomplete RBBB-personally reviewed  ASSESSMENT: 1. History of atrial fibrillation, status post DCCV 2. Paroxysmal atrial flutter 3. Palpitations 4. CHADSVASC score of 4 - on Eliquis 5. Hypertension  6. Dyslipidemia  7. Daytime  somnolence/fatigue  PLAN: 1.   Mrs. Ergle is doing well without any recurrent A. fib.  She is been compliant on Eliquis without bleeding problems.  She denies any significant frequent palpitations.  Blood pressures generally well controlled.  Her weight is up a few pounds.  She says she is been less active due to some back pain.  She is reporting daytime somnolence and fatigue and has trouble staying asleep but not falling asleep at night.  She is no one to tell her whether she snores at night and night, but is reasonable to consider an overnight sleep study.  She said she would not be able to schedule it until after the holidays.  Plan follow-up with me in 6 months or sooner as necessary.  Pixie Casino, MD, [facility], [place] Director of the Advanced Lipid Disorders &  Cardiovascular [facility] Reduction Clinic Diplomate of the American Board of Clinical Lipidology Attending Cardiologist  Direct Dial: [phone]  Fax: [phone]  Website:  [email or web address] Nhu Glasby 09/22/2018, 11:21 AM

## 2018-09-22 NOTE — Patient Instructions (Addendum)
Medication Instructions:  Continue current medications If you need a refill on your cardiac medications before your next appointment, please call your pharmacy.   Lab work: NONE If you have labs (blood work) drawn today and your tests are completely normal, you will receive your results only by: Marland Kitchen MyChart Message (if you have MyChart) OR . A paper copy in the mail If you have any lab test that is abnormal or we need to change your treatment, we will call you to review the results.  Testing/Procedures: Your physician has recommended that you have a sleep study. This test records several body functions during sleep, including: brain activity, eye movement, oxygen and carbon dioxide blood levels, heart rate and rhythm, breathing rate and rhythm, the flow of air through your mouth and nose, snoring, body muscle movements, and chest and belly movement. -- this test will be preauthorized with your insurance company first -- you will be called by our sleep coordinator Mariann Laster to schedule this either at your home or at [place]: At [facility], you and your health needs are our priority.  As part of our continuing mission to provide you with exceptional heart care, we have created designated Provider Care Teams.  These Care Teams include your primary Cardiologist (physician) and Advanced Practice Providers (APPs -  Physician Assistants and Nurse Practitioners) who all work together to provide you with the care you need, when you need it. You will need a follow up appointment in 6 months.  Please call our office 2 months in advance to schedule this appointment.  You may see Pixie Casino, MD or one of the following Advanced Practice Providers on your designated Care Team: [place], [place] . Fabian Sharp, PA-C  Any Other Special Instructions Will Be Listed Below (If Applicable).  ** handicap parking placard provided

## 2018-09-23 ENCOUNTER — Telehealth: Payer: Self-pay | Admitting: *Deleted

## 2018-09-23 NOTE — Telephone Encounter (Signed)
-----   Message from Lawana Pai sent at 09/22/2018 11:42 AM EST ----- Regarding: sleep study Please precert and schedulee. Thanks!!

## 2018-09-23 NOTE — Telephone Encounter (Signed)
Patient notified of sleep study appointment  Scheduled for 11/12/18 @ [facility]. Per Norwood Levo @ [employer] no PA required since Medicare is primary insurance.

## 2018-10-06 DIAGNOSIS — J441 Chronic obstructive pulmonary disease with (acute) exacerbation: Secondary | ICD-10-CM | POA: Diagnosis not present

## 2018-11-12 ENCOUNTER — Encounter (HOSPITAL_BASED_OUTPATIENT_CLINIC_OR_DEPARTMENT_OTHER): Payer: Medicare Other

## 2018-11-22 ENCOUNTER — Ambulatory Visit
Admission: RE | Admit: 2018-11-22 | Discharge: 2018-11-22 | Disposition: A | Discharge status: Home / Self Care | Payer: Medicare Other | Source: Ambulatory Visit | Attending: Family Medicine | Admitting: Family Medicine

## 2018-11-22 DIAGNOSIS — Z780 Asymptomatic menopausal state: Secondary | ICD-10-CM | POA: Diagnosis not present

## 2018-11-22 DIAGNOSIS — M810 Age-related osteoporosis without current pathological fracture: Secondary | ICD-10-CM | POA: Diagnosis not present

## 2018-11-22 DIAGNOSIS — E2839 Other primary ovarian failure: Secondary | ICD-10-CM

## 2018-11-22 DIAGNOSIS — M85852 Other specified disorders of bone density and structure, left thigh: Secondary | ICD-10-CM | POA: Diagnosis not present

## 2018-11-25 ENCOUNTER — Encounter (HOSPITAL_BASED_OUTPATIENT_CLINIC_OR_DEPARTMENT_OTHER): Payer: Medicare Other

## 2018-11-29 DIAGNOSIS — K5900 Constipation, unspecified: Secondary | ICD-10-CM | POA: Diagnosis not present

## 2018-11-29 DIAGNOSIS — M545 Low back pain: Secondary | ICD-10-CM | POA: Diagnosis not present

## 2018-11-29 DIAGNOSIS — M810 Age-related osteoporosis without current pathological fracture: Secondary | ICD-10-CM | POA: Diagnosis not present

## 2018-11-30 ENCOUNTER — Telehealth: Payer: Self-pay | Admitting: Internal Medicine

## 2018-11-30 NOTE — Telephone Encounter (Signed)
Spoke with patient of Dr. Debara Pickett who reports SOB & weakness of 2 months off and on. She reports when she walks, her back & thighs get very tired but she does report arthritis in her spine. She reports huffing and puffing when walking up a flight of stairs but does not have to stop to rest while walking this incline. She reports about a 1lb weight gain, which she attributes to not being able to exercise d/t back & thigh fatigue. She reports BP and HR are generally well controlled - average BP 120s/60s but at PCP office yesterday her BP was 161/60s and recheck 153/60s - which she is unsure if is white coat HTN. She is asking if she needs a med adjustment, but I advised her that she may need an evaluation. She has been scheduled to see Isaac Laud PA 1/29 @ 11am.   Routed to MD as Juluis Rainier

## 2018-11-30 NOTE — Telephone Encounter (Signed)
° ° °  Pt c/o Shortness Of Breath: STAT if SOB developed within the last 24 hours or pt is noticeably SOB on the phone  1. Are you currently SOB (can you hear that pt is SOB on the phone)? NO  2. How long have you been experiencing SOB? 2 months  3. Are you SOB when sitting or when up moving around? Moving around  4. Are you currently experiencing any other symptoms? weakness

## 2018-12-01 ENCOUNTER — Encounter: Payer: Self-pay | Admitting: Physician Assistant

## 2018-12-01 ENCOUNTER — Ambulatory Visit (INDEPENDENT_AMBULATORY_CARE_PROVIDER_SITE_OTHER): Payer: Medicare Other | Admitting: Physician Assistant

## 2018-12-01 VITALS — BP 130/68 | HR 64 | Ht 63.0 in | Wt 143.0 lb

## 2018-12-01 DIAGNOSIS — E785 Hyperlipidemia, unspecified: Secondary | ICD-10-CM

## 2018-12-01 DIAGNOSIS — R0609 Other forms of dyspnea: Secondary | ICD-10-CM | POA: Diagnosis not present

## 2018-12-01 DIAGNOSIS — I480 Paroxysmal atrial fibrillation: Secondary | ICD-10-CM

## 2018-12-01 DIAGNOSIS — I10 Essential (primary) hypertension: Secondary | ICD-10-CM

## 2018-12-01 NOTE — Progress Notes (Signed)
Cardiology Office Note    Date:  12/03/2018   ID:  Holly Benson, DOB 01-15-1933, MRN [id number]  PCP:  London Pepper, MD  Cardiologist:  Dr. Debara Pickett  Chief Complaint  Patient presents with  . Follow-up    seen for Dr. Debara Pickett    History of Present Illness:  Holly Benson is a 85 y.o. female with PMH of COPD, HTN, HLD and atrial fibrillation.  Patient had electrical cardioversion in the past and was offered atrial fibrillation ablation however she preferred medical therapy instead.  She was controlled on diltiazem and Eliquis.  She had echocardiogram in the past year [place] that showed EF of 40 to 45%.  Echocardiogram obtained on 11/18/2017 showed EF 60 to 65%, moderate TR, moderate LAE, PA peak pressure 51 mmHg.  Patient was last seen by Dr. Debara Pickett on 09/22/2018 at which time she complained of some fatigue.  EKG demonstrated sinus rhythm with PVCs.  Otherwise she was doing well.  Patient called yesterday reports shortness of breath and weakness on and off for the past 2 months.  Patient says for the past 2 months, she has been having increasing dyspnea with exertion.  She also has been staying fatigued as well.  She denies any bleeding issues recently.  Otherwise she denies any chest pain.  She does not have significant lower extremity edema and no orthopnea or PND.  On physical exam, she appears to be euvolemic.  On EKG, she is staying in sinus rhythm.  I plan to obtain CBC, basic metabolic panel and TSH.  I will arrange for her to have a outpatient Lexiscan stress test to make sure this is not anginal equivalent.  She says she is unlikely to keep up the treadmill.  If the Lexiscan stress test is normal,  then she can follow-up with Dr. Debara Pickett in 3 to 4 months.   Past Medical History:  Diagnosis Date  . Atrial fibrillation ([place])   . COPD (chronic obstructive pulmonary disease) ([place])   . Hyperlipidemia   . Hypertension     History reviewed. No pertinent surgical  history.  Current Medications: Outpatient Medications Prior to Visit  Medication Sig Dispense Refill  . albuterol (PROVENTIL HFA;VENTOLIN HFA) 108 (90 Base) MCG/ACT inhaler Inhale 2 puffs into the lungs every 4 (four) hours as needed for wheezing or shortness of breath. 1 Inhaler 6  . apixaban (ELIQUIS) 5 MG TABS tablet Take 1 tablet (5 mg total) by mouth 2 (two) times daily. 180 tablet 1  . clonazePAM (KLONOPIN) 0.5 MG tablet Take 0.5 tablets by mouth as needed.    . diltiazem (CARDIZEM CD) 240 MG 24 hr capsule Take 240 mg by mouth daily.     . hydrochlorothiazide (MICROZIDE) 12.5 MG capsule TAKE ONE CAPSULE BY MOUTH DAILY (Patient taking differently: TAKE ONE CAPSULE BY MOUTH DAILY in the morning) 60 capsule 11  . losartan (COZAAR) 100 MG tablet Take 1 tablet (100 mg total) by mouth daily. Takes in AM 90 tablet 3  . metoprolol succinate (TOPROL-XL) 25 MG 24 hr tablet TAKE 1/2 TABLET BY MOUTH DAILY 30 tablet 6  . pravastatin (PRAVACHOL) 40 MG tablet Take 40 mg by mouth daily.    . Tiotropium Bromide-Olodaterol (STIOLTO RESPIMAT) 2.5-2.5 MCG/ACT AERS Inhale 2 puffs into the lungs every morning. 3 Inhaler 3   No facility-administered medications prior to visit.      Allergies:   Lovenox [enoxaparin sodium]; Amlodipine; and Metoprolol   Social History   Socioeconomic History  .  Marital status: Widowed    Spouse name: Not on file  . Number of children: Not on file  . Years of education: Not on file  . Highest education level: Not on file  Occupational History  . Not on file  Social Needs  . Financial resource strain: Not on file  . Food insecurity:    Worry: Not on file    Inability: Not on file  . Transportation needs:    Medical: Not on file    Non-medical: Not on file  Tobacco Use  . Smoking status: Former Smoker    Packs/day: 1.50    Years: 30.00    Pack years: 45.00    Last attempt to quit: 11/04/1991    Years since quitting: 27.0  . Smokeless tobacco: Never Used   Substance and Sexual Activity  . Alcohol use: Not on file  . Drug use: No  . Sexual activity: Not on file  Lifestyle  . Physical activity:    Days per week: Not on file    Minutes per session: Not on file  . Stress: Not on file  Relationships  . Social connections:    Talks on phone: Not on file    Gets together: Not on file    Attends religious service: Not on file    Active member of club or organization: Not on file    Attends meetings of clubs or organizations: Not on file    Relationship status: Not on file  Other Topics Concern  . Not on file  Social History Narrative  . Not on file     Family History:  The patient's family history includes Stroke in her maternal grandfather; Suicidality in her father.   ROS:   Please see the history of present illness.    ROS All other systems reviewed and are negative.   PHYSICAL EXAM:   VS:  BP 130/68   Pulse 64   Ht 5\' 3"  (1.6 m)   Wt 143 lb (64.9 kg)   BMI 25.33 kg/m    GEN: Well nourished, well developed, in no acute distress  HEENT: normal  Neck: no JVD, carotid bruits, or masses Cardiac: RRR; no murmurs, rubs, or gallops,no edema  Respiratory:  clear to auscultation bilaterally, normal work of breathing GI: soft, nontender, nondistended, + BS MS: no deformity or atrophy  Skin: warm and dry, no rash Neuro:  Alert and Oriented x 3, Strength and sensation are intact Psych: euthymic mood, full affect  Wt Readings from Last 3 Encounters:  12/01/18 143 lb (64.9 kg)  09/22/18 145 lb 3.2 oz (65.9 kg)  08/30/18 139 lb (63 kg)      Studies/Labs Reviewed:   EKG:  EKG is ordered today.  The ekg ordered today demonstrates sinus rhythm, nonspecific T wave changes  Recent Labs: 12/01/2018: BUN 26; Creatinine, Ser 0.95; Hemoglobin 14.2; Platelets 374; Potassium 4.1; Sodium 138; TSH 3.080   Lipid Panel No results found for: CHOL, TRIG, HDL, CHOLHDL, VLDL, LDLCALC, LDLDIRECT  Additional studies/ records that were reviewed  today include:   Echo 11/18/2017 LV EF: 60% -   65% Study Conclusions  - Left ventricle: The cavity size was normal. Wall thickness was   increased in a pattern of mild LVH. Systolic function was normal.   The estimated ejection fraction was in the range of 60% to 65%.   Wall motion was normal; there were no regional wall motion   abnormalities. The study is not technically sufficient to allow  evaluation of LV diastolic function. - Mitral valve: Mildly thickened leaflets . There was trivial   regurgitation. - Left atrium: Moderately dilated. - Right ventricle: The cavity size was mildly dilated. - Right atrium: Moderately dilated. - Tricuspid valve: There was moderate regurgitation. - Pulmonary arteries: PA peak pressure: 51 mm Hg (S). - Inferior vena cava: The vessel was dilated. The respirophasic   diameter changes were blunted (< 50%), consistent with elevated   central venous pressure.  Impressions:  - LVEF 60-65%, mild LVH, normal wall motion, trivial MR, moderate   biatrial enlargement, mild RVE, RVSP 51 mmHg, dilated IVC.    ASSESSMENT:    1. Dyspnea on exertion   2. Essential hypertension   3. Hyperlipidemia, unspecified hyperlipidemia type   4. PAF (paroxysmal atrial fibrillation) (HCC)      PLAN:  In order of problems listed above:  1. Dyspnea on exertion: Recent symptom concerning for anginal equivalent.  Will obtain basic metabolic panel CBC and a TSH.  We will also proceed with Lexiscan Myoview.  If work-up is negative and the EF is normal, I would continue to monitor this symptom  2. PAF: On Eliquis 5 mg twice daily.  Rate controlled on Toprol-XL.  Maintaining sinus rhythm on today's EKG  3. Hypertension: Blood pressure stable  4. Hyperlipidemia: On Pravachol 40 mg daily    Medication Adjustments/Labs and Tests Ordered: Current medicines are reviewed at length with the patient today.  Concerns regarding medicines are outlined above.  Medication  changes, Labs and Tests ordered today are listed in the Patient Instructions below. Patient Instructions  Medication Instructions:   Your physician recommends that you continue on your current medications as directed. Please refer to the Current Medication list given to you today.  If you need a refill on your cardiac medications before your next appointment, please call your pharmacy.   Lab work: BMET CBC TSH   If you have labs (blood work) drawn today and your tests are completely normal, you will receive your results only by: Marland Kitchen MyChart Message (if you have MyChart) OR . A paper copy in the mail If you have any lab test that is abnormal or we need to change your treatment, we will call you to review the results.  Testing/Procedures: Your physician has requested that you have a lexiscan myoview. For further information please visit [email or web address]. Please follow instruction sheet, as given.  [place]  Follow-Up: At [facility], you and your health needs are our priority.  As part of our continuing mission to provide you with exceptional heart care, we have created designated Provider Care Teams.  These Care Teams include your primary Cardiologist (physician) and Advanced Practice Providers (APPs -  Physician Assistants and Nurse Practitioners) who all work together to provide you with the care you need, when you need it. You will need a follow up appointment in 3 months.  Please call our office 2 months in advance to schedule this appointment.  You may see Pixie Casino, MD or one of the following Advanced Practice Providers on your designated Care Team: [place], [place] . Fabian Sharp, PA-C         Signed, [place], [place]  12/03/2018 11:36 PM    [facility] [address], [place], [place]  [address] Phone: [phone]; Fax: [phone]

## 2018-12-01 NOTE — Patient Instructions (Signed)
Medication Instructions:   Your physician recommends that you continue on your current medications as directed. Please refer to the Current Medication list given to you today.  If you need a refill on your cardiac medications before your next appointment, please call your pharmacy.   Lab work: BMET CBC TSH   If you have labs (blood work) drawn today and your tests are completely normal, you will receive your results only by: Marland Kitchen MyChart Message (if you have MyChart) OR . A paper copy in the mail If you have any lab test that is abnormal or we need to change your treatment, we will call you to review the results.  Testing/Procedures: Your physician has requested that you have a lexiscan myoview. For further information please visit [email or web address]. Please follow instruction sheet, as given.  [place]  Follow-Up: At [facility], you and your health needs are our priority.  As part of our continuing mission to provide you with exceptional heart care, we have created designated Provider Care Teams.  These Care Teams include your primary Cardiologist (physician) and Advanced Practice Providers (APPs -  Physician Assistants and Nurse Practitioners) who all work together to provide you with the care you need, when you need it. You will need a follow up appointment in 3 months.  Please call our office 2 months in advance to schedule this appointment.  You may see Pixie Casino, MD or one of the following Advanced Practice Providers on your designated Care Team: [place], [place] . Fabian Sharp, PA-C

## 2018-12-02 LAB — CBC
HEMATOCRIT: 41.9 % (ref 34.0–46.6)
Hemoglobin: 14.2 g/dL (ref 11.1–15.9)
MCH: 32.9 pg (ref 26.6–33.0)
MCHC: 33.9 g/dL (ref 31.5–35.7)
MCV: 97 fL (ref 79–97)
Platelets: 374 10*3/uL (ref 150–450)
RBC: 4.32 x10E6/uL (ref 3.77–5.28)
RDW: 12.6 % (ref 11.7–15.4)
WBC: 7.4 10*3/uL (ref 3.4–10.8)

## 2018-12-02 LAB — BASIC METABOLIC PANEL
BUN/Creatinine Ratio: 27 (ref 12–28)
BUN: 26 mg/dL (ref 8–27)
CALCIUM: 9.9 mg/dL (ref 8.7–10.3)
CO2: 20 mmol/L (ref 20–29)
Chloride: 100 mmol/L (ref 96–106)
Creatinine, Ser: 0.95 mg/dL (ref 0.57–1.00)
GFR calc Af Amer: 64 mL/min/{1.73_m2} (ref >59)
GFR calc non Af Amer: 56 mL/min/{1.73_m2} — ABNORMAL LOW (ref >59)
Glucose: 89 mg/dL (ref 65–99)
Potassium: 4.1 mmol/L (ref 3.5–5.2)
Sodium: 138 mmol/L (ref 134–144)

## 2018-12-02 LAB — TSH: TSH: 3.08 u[IU]/mL (ref 0.450–4.500)

## 2018-12-03 ENCOUNTER — Encounter: Payer: Self-pay | Admitting: Physician Assistant

## 2018-12-03 ENCOUNTER — Telehealth (HOSPITAL_COMMUNITY): Payer: Self-pay

## 2018-12-03 NOTE — Telephone Encounter (Signed)
Encounter complete. 

## 2018-12-08 ENCOUNTER — Ambulatory Visit (HOSPITAL_COMMUNITY)
Admission: RE | Admit: 2018-12-08 | Discharge: 2018-12-08 | Disposition: A | Discharge status: Home / Self Care | Payer: Medicare Other | Source: Ambulatory Visit | Attending: Cardiology | Admitting: Cardiology

## 2018-12-08 DIAGNOSIS — R0609 Other forms of dyspnea: Secondary | ICD-10-CM | POA: Insufficient documentation

## 2018-12-08 LAB — MYOCARDIAL PERFUSION IMAGING
CHL CUP NUCLEAR SDS: 3
CHL CUP NUCLEAR SRS: 2
CHL CUP NUCLEAR SSS: 5
LV dias vol: 55 mL (ref 46–106)
LV sys vol: 16 mL
Peak HR: 85 {beats}/min
Rest HR: 70 {beats}/min
TID: 1.26

## 2018-12-08 MED ORDER — REGADENOSON 0.4 MG/5ML IV SOLN
0.4000 mg | Freq: Once | INTRAVENOUS | Status: AC
  Administered 2018-12-08: 0.4 mg via INTRAVENOUS

## 2018-12-08 MED ORDER — TECHNETIUM TC 99M TETROFOSMIN IV KIT
32.9000 | PACK | Freq: Once | INTRAVENOUS | Status: AC | PRN
  Administered 2018-12-08: 32.9 via INTRAVENOUS
  Filled 2018-12-08: qty 33

## 2018-12-08 MED ORDER — TECHNETIUM TC 99M TETROFOSMIN IV KIT
10.9000 | PACK | Freq: Once | INTRAVENOUS | Status: AC | PRN
  Administered 2018-12-08: 10.9 via INTRAVENOUS
  Filled 2018-12-08: qty 11

## 2018-12-09 ENCOUNTER — Telehealth: Payer: Self-pay | Admitting: Internal Medicine

## 2018-12-09 NOTE — Telephone Encounter (Signed)
Lm to call back ./cy 

## 2018-12-09 NOTE — Telephone Encounter (Signed)
Follow Up:     She received her Stress Test results on My-Chart, she have some questions about it please.

## 2018-12-09 NOTE — Progress Notes (Signed)
Patient was made aware of her lab results by Lamar Laundry, RN     12/09/18 9:45 AM  Note    Returned pt call. Pt made aware of myoview and lab results with verbalized understanding.  Notes recorded by [name], PA on 12/08/2018 at 5:20 PM EST Recent labwork reassuring. This stress test is also normal, without no sign of significant reversible blockage. No further cardiac workup recommended.  Pt sts that she is wondering what she should do next as it relates to her DOE. Adv pt to contact her pcp for additional work up. Her stress was normal and showed no indication of ischemia/blockages. Her lab work should normal tsh, kidney and electrolytes, and no indication of anemia.  Pt voiced appreciation for the info and will contact her pcp as recommended.

## 2018-12-09 NOTE — Progress Notes (Signed)
Patient was made aware of Myoview results by Lamar Laundry, RN     12/09/18 9:45 AM  Note    Returned pt call. Pt made aware of myoview and lab results with verbalized understanding.  Notes recorded by [name], PA on 12/08/2018 at 5:20 PM EST Recent labwork reassuring. This stress test is also normal, without no sign of significant reversible blockage. No further cardiac workup recommended.  Pt sts that she is wondering what she should do next as it relates to her DOE. Adv pt to contact her pcp for additional work up. Her stress was normal and showed no indication of ischemia/blockages. Her lab work should normal tsh, kidney and electrolytes, and no indication of anemia.  Pt voiced appreciation for the info and will contact her pcp as recommended.

## 2018-12-09 NOTE — Telephone Encounter (Signed)
Called patient and left voice message for the patient to call back for lab results

## 2018-12-09 NOTE — Telephone Encounter (Signed)
New message      Pt returning call for lab results

## 2018-12-09 NOTE — Telephone Encounter (Signed)
Returned pt call. Pt made aware of myoview and lab results with verbalized understanding.  Notes recorded by [name], PA on 12/08/2018 at 5:20 PM EST Recent labwork reassuring. This stress test is also normal, without no sign of significant reversible blockage. No further cardiac workup recommended.  Pt sts that she is wondering what she should do next as it relates to her DOE. Adv pt to contact her pcp for additional work up. Her stress was normal and showed no indication of ischemia/blockages. Her lab work should normal tsh, kidney and electrolytes, and no indication of anemia.  Pt voiced appreciation for the info and will contact her pcp as recommended.

## 2018-12-10 NOTE — Telephone Encounter (Signed)
Pt aware of lab results and myoview Per pt is still having dyspnea encouraged pt to contact Dr Agustina Caroli office the patient agrees and will keep upcoming appt with Dr Debara Pickett .Adonis Housekeeper

## 2018-12-27 ENCOUNTER — Ambulatory Visit (HOSPITAL_BASED_OUTPATIENT_CLINIC_OR_DEPARTMENT_OTHER): Payer: Medicare Other | Attending: Internal Medicine | Admitting: Cardiovascular Disease

## 2018-12-27 VITALS — Ht 63.0 in | Wt 130.0 lb

## 2018-12-27 DIAGNOSIS — R0902 Hypoxemia: Secondary | ICD-10-CM | POA: Insufficient documentation

## 2018-12-27 DIAGNOSIS — Z7901 Long term (current) use of anticoagulants: Secondary | ICD-10-CM | POA: Insufficient documentation

## 2018-12-27 DIAGNOSIS — G4733 Obstructive sleep apnea (adult) (pediatric): Secondary | ICD-10-CM

## 2018-12-27 DIAGNOSIS — G4731 Primary central sleep apnea: Secondary | ICD-10-CM | POA: Diagnosis not present

## 2018-12-27 DIAGNOSIS — G4710 Hypersomnia, unspecified: Secondary | ICD-10-CM

## 2018-12-27 DIAGNOSIS — G4719 Other hypersomnia: Secondary | ICD-10-CM

## 2018-12-27 DIAGNOSIS — I480 Paroxysmal atrial fibrillation: Secondary | ICD-10-CM | POA: Diagnosis not present

## 2018-12-27 DIAGNOSIS — Z79899 Other long term (current) drug therapy: Secondary | ICD-10-CM | POA: Insufficient documentation

## 2018-12-27 DIAGNOSIS — G4761 Periodic limb movement disorder: Secondary | ICD-10-CM | POA: Insufficient documentation

## 2018-12-28 ENCOUNTER — Encounter (HOSPITAL_BASED_OUTPATIENT_CLINIC_OR_DEPARTMENT_OTHER): Payer: Self-pay | Admitting: Cardiovascular Disease

## 2018-12-28 ENCOUNTER — Other Ambulatory Visit: Payer: Self-pay | Admitting: Cardiovascular Disease

## 2018-12-28 DIAGNOSIS — G4733 Obstructive sleep apnea (adult) (pediatric): Secondary | ICD-10-CM

## 2018-12-28 DIAGNOSIS — I480 Paroxysmal atrial fibrillation: Secondary | ICD-10-CM

## 2018-12-28 NOTE — Procedures (Signed)
Patient Name: Holly Benson, Holly Benson Date: 12/27/2018 Gender: Female D.O.B: 1933-01-22 Age (years): 5 Referring Provider: Nadean Corwin Hilty Height (inches): 63 Interpreting Physician: Shelva Majestic MD, ABSM Weight (lbs): 130 RPSGT: Rebekah [place] BMI: 23 MRN: [id number] Neck Size: 13.00  CLINICAL INFORMATION Sleep Study Type: NPSG  Indication for sleep study: Daytime Fatigue, Hypertension, Snoring, PAF.  Epworth Sleepiness Score: 5  SLEEP STUDY TECHNIQUE As per the AASM Manual for the Scoring of Sleep and Associated Events v2.3 (April 2016) with a hypopnea requiring 4% desaturations.  The channels recorded and monitored were frontal, central and occipital EEG, electrooculogram (EOG), submentalis EMG (chin), nasal and oral airflow, thoracic and abdominal wall motion, anterior tibialis EMG, snore microphone, electrocardiogram, and pulse oximetry.  MEDICATIONS     albuterol (PROVENTIL HFA;VENTOLIN HFA) 108 (90 Base) MCG/ACT inhaler             apixaban (ELIQUIS) 5 MG TABS tablet         clonazePAM (KLONOPIN) 0.5 MG tablet         diltiazem (CARDIZEM CD) 240 MG 24 hr capsule         hydrochlorothiazide (MICROZIDE) 12.5 MG capsule         losartan (COZAAR) 100 MG tablet         metoprolol succinate (TOPROL-XL) 25 MG 24 hr tablet         pravastatin (PRAVACHOL) 40 MG tablet         Tiotropium Bromide-Olodaterol (STIOLTO RESPIMAT) 2.5-2.5 MCG/ACT AERS      Medications self-administered by patient taken the night of the study : N/A  SLEEP ARCHITECTURE The study was initiated at 11:02:15 PM and ended at 5:36:41 AM.  Sleep onset time was 21.5 minutes and the sleep efficiency was 73.5%%. The total sleep time was 290 minutes.  Stage REM latency was 199.5 minutes.  The patient spent 22.1%% of the night in stage N1 sleep, 57.2%% in stage N2 sleep, 3.4%% in stage N3 and 17.2% in REM.  Alpha intrusion was absent.  Supine sleep was 1.90%.  RESPIRATORY PARAMETERS The  overall apnea/hypopnea index (AHI) was 16.8 per hour. The respiratory disturbance index (RDI) was 19.4/h. There were 70 total apneas, including 27 obstructive, 43 central and 0 mixed apneas. There were 11 hypopneas and 13 RERAs.  The AHI during Stage REM sleep was 2.4 per hour.  AHI while supine was 87.3 per hour.  The mean oxygen saturation was 92.3%. The minimum SpO2 during sleep was 82.0%.  Moderate snoring was noted during this study.  CARDIAC DATA The 2 lead EKG demonstrated sinus rhythm. The mean heart rate was 61.4 beats per minute. Other EKG findings include: PVCs.  LEG MOVEMENT DATA The total PLMS were 86 with a resulting PLMS index of 17.8.  Associated arousal with leg movement index was 5.8 .  IMPRESSIONS - Moderate obstructive sleep apnea occurred during this study (AHI 16.8/h; RDI 19.4/h). Sleep apnea was very severe with supine sleep (AHI 87.3/h). - Mild central sleep apnea occurred during this study (CAI = 8.9/h). - Moderate oxygen desaturation to a nadir of 82%. - The patient snored with moderate snoring volume. - EKG findings include PVCs. - Clinically significant periodic limb movements did not occur during sleep. Associated arousals were significant.  DIAGNOSIS - Obstructive Sleep Apnea (327.23 [G47.33 ICD-10]) - Central Sleep Apnea (327.27 [G47.37 ICD-10]) - Periodic Limb Movement During Sleep (327.51 [G47.61 ICD-10]) - Nocturnal Hypoxemia (327.26 [G47.36 ICD-10])  RECOMMENDATIONS - CPAP titration to determine optimal pressure required to  alleviate sleep disordered breathing. BiPAP or ASV titration may be required to eliminate central sleep apnea. - Effort should be made to optimize nasal and oropharyngeal patency. - Positional therapy avoiding supine position during sleep. - If pateient is symptomatic with restless legs on CPAP therapy consider a trial of pharmacotherapy for treatment of Periodic Leg Movements of Sleep. - Avoid alcohol, sedatives and other CNS  depressants that may worsen sleep apnea and disrupt normal sleep architecture. - Sleep hygiene should be reviewed to assess factors that may improve sleep quality. - Weight management and regular exercise should be initiated or continued if appropriate.  [Electronically signed] 12/28/2018 12:07 PM  Shelva Majestic MD, [facility], [place], American Board of Sleep Medicine   NPI: [id number] [place] PH: [phone]   FX: [phone] [place]

## 2018-12-30 ENCOUNTER — Other Ambulatory Visit: Payer: Self-pay | Admitting: Internal Medicine

## 2018-12-30 DIAGNOSIS — IMO0002 Reserved for concepts with insufficient information to code with codable children: Secondary | ICD-10-CM

## 2018-12-30 DIAGNOSIS — I10 Essential (primary) hypertension: Secondary | ICD-10-CM

## 2018-12-30 DIAGNOSIS — G4736 Sleep related hypoventilation in conditions classified elsewhere: Secondary | ICD-10-CM

## 2018-12-30 DIAGNOSIS — I480 Paroxysmal atrial fibrillation: Secondary | ICD-10-CM

## 2018-12-30 DIAGNOSIS — G4733 Obstructive sleep apnea (adult) (pediatric): Secondary | ICD-10-CM

## 2018-12-30 NOTE — Progress Notes (Signed)
Patient notified of sleep study results and recommendations. 

## 2018-12-30 NOTE — Progress Notes (Signed)
She has agreed to proceed with CPAP titration.

## 2018-12-31 ENCOUNTER — Ambulatory Visit (HOSPITAL_BASED_OUTPATIENT_CLINIC_OR_DEPARTMENT_OTHER): Payer: Medicare Other | Attending: Cardiovascular Disease | Admitting: Cardiovascular Disease

## 2018-12-31 VITALS — Ht 63.0 in | Wt 130.0 lb

## 2018-12-31 DIAGNOSIS — G4733 Obstructive sleep apnea (adult) (pediatric): Secondary | ICD-10-CM | POA: Diagnosis not present

## 2018-12-31 DIAGNOSIS — I10 Essential (primary) hypertension: Secondary | ICD-10-CM | POA: Insufficient documentation

## 2018-12-31 DIAGNOSIS — G4736 Sleep related hypoventilation in conditions classified elsewhere: Secondary | ICD-10-CM | POA: Insufficient documentation

## 2018-12-31 DIAGNOSIS — I480 Paroxysmal atrial fibrillation: Secondary | ICD-10-CM | POA: Insufficient documentation

## 2018-12-31 DIAGNOSIS — IMO0002 Reserved for concepts with insufficient information to code with codable children: Secondary | ICD-10-CM

## 2019-01-03 DIAGNOSIS — R5383 Other fatigue: Secondary | ICD-10-CM | POA: Diagnosis not present

## 2019-01-03 DIAGNOSIS — M25561 Pain in right knee: Secondary | ICD-10-CM | POA: Diagnosis not present

## 2019-01-03 DIAGNOSIS — M2550 Pain in unspecified joint: Secondary | ICD-10-CM | POA: Diagnosis not present

## 2019-01-04 ENCOUNTER — Other Ambulatory Visit: Payer: Self-pay | Admitting: Internal Medicine

## 2019-01-08 ENCOUNTER — Encounter (HOSPITAL_BASED_OUTPATIENT_CLINIC_OR_DEPARTMENT_OTHER): Payer: Self-pay | Admitting: Cardiovascular Disease

## 2019-01-08 NOTE — Procedures (Signed)
Patient Name: Holly Benson, Holly Benson Date: 12/31/2018 Gender: Female D.O.B: [birth year] Age (years): 24 Referring Provider: Nadean Corwin Hilty Height (inches): 63 Interpreting Physician: Shelva Majestic MD, ABSM Weight (lbs): 130 RPSGT: Baxter Flattery BMI: 23 MRN: [id number] Neck Size: 14.00  CLINICAL INFORMATION The patient is referred for a split night study with BPAP. Most recent polysomnogram dated 12/27/2018 revealed an AHI of 16.8/h and RDI of 19.4/h;  O2 nadir 82%.  Most recent titration study was on 12/31/2018.  MEDICATIONS     albuterol (PROVENTIL HFA;VENTOLIN HFA) 108 (90 Base) MCG/ACT inhaler             apixaban (ELIQUIS) 5 MG TABS tablet         clonazePAM (KLONOPIN) 0.5 MG tablet         diltiazem (CARDIZEM CD) 240 MG 24 hr capsule         hydrochlorothiazide (MICROZIDE) 12.5 MG capsule         losartan (COZAAR) 100 MG tablet         metoprolol succinate (TOPROL-XL) 25 MG 24 hr tablet         pravastatin (PRAVACHOL) 40 MG tablet         Tiotropium Bromide-Olodaterol (STIOLTO RESPIMAT) 2.5-2.5 MCG/ACT AERS      Medications self-administered by patient taken the night of the study : N/A  SLEEP STUDY TECHNIQUE As per the AASM Manual for the Scoring of Sleep and Associated Events v2.3 (April 2016) with a hypopnea requiring 4% desaturations.  The channels recorded and monitored were frontal, central and occipital EEG, electrooculogram (EOG), submentalis EMG (chin), nasal and oral airflow, thoracic and abdominal wall motion, anterior tibialis EMG, snore microphone, electrocardiogram, and pulse oximetry. Bi-level positive airway pressure (BiPAP) was initiated when the patient met split night criteria and was titrated according to treat sleep-disordered breathing.  RESPIRATORY PARAMETERS Diagnostic Total AHI (/hr): 25.9 RDI (/hr): 27.1 OA Index (/hr): 18.3 CA Index (/hr): 4.3 REM AHI (/hr): 0.0 NREM AHI (/hr): 30.0 Supine AHI (/hr): 77.7 Non-supine AHI (/hr): 18.64 Min O2  Sat (%): 81.0 Mean O2 (%): 90.8 Time below 88% (min): 41.6   Titration  IPAP Pressure (cm): 14 Optimal EPAP Pressure (cm): 10 AHI at Optimal Pressure (/hr): 43.3 Min O2 at Optimal Pressure (%): 90.0 Sleep % at Optimal (%): 50 Supine % at Optimal (%): 0    SLEEP ARCHITECTURE The study was initiated at 10:57:20 PM and terminated at 4:55:32 AM. The total recorded time was 358.2 minutes. EEG confirmed total sleep time was 308 minutes yielding a sleep efficiency of 86.0%%. Sleep onset after lights out was 1.7 minutes with a REM latency of 120.0 minutes. The patient spent 3.1%% of the night in stage N1 sleep, 83.3%% in stage N2 sleep, 0.0%% in stage N3 and 13.6% in REM. Wake after sleep onset (WASO) was 48.5 minutes. The Arousal Index was 16.0/hour.  LEG MOVEMENT DATA The total Periodic Limb Movements of Sleep (PLMS) were 0. The PLMS index was 0.0 .  CARDIAC DATA The 2 lead EKG demonstrated sinus rhythm. The mean heart rate was 100.0 beats per minute. Other EKG findings include: PVCs.  IMPRESSIONS - CPAP was initiated at 5 cm and was titrated to 11 cm;  AHI at 11 cm was 20.8.  Due to continued events BiPAP was started at 12/8 and was titrated to 14/10.  Central events were presents throughout.  AHI at 12/8 was 4.9/h with O2 nadir 88%.  AHI at 13/9 was 11.6/h and at 14/10 was  52/h.   - No significant central sleep apnea occurred during the diagnostic portion of the study (CAI = 4.3/hour). - No snoring was audible during this study. - EKG findings include PVCs. - Clinically significant periodic limb movements of sleep did not occur during the study.  DIAGNOSIS - Obstructive Sleep Apnea (327.23 [G47.33 ICD-10])  RECOMMENDATIONS - In this patients with normal LV function and suboptimal CPAP and BiPAP titration with frequent central events, recommend an ASV titration.   A medium size Resmed Full Face Mask AirFit F30 mask was used for the titration study.  - Efforts should be made to optimize nasal  and oropharyngeal patency.  - Avoid alcohol, sedatives and other CNS depressants that may worsen sleep apnea and disrupt normal sleep architecture. - Sleep hygiene should be reviewed to assess factors that may improve sleep quality. - Weight management and regular exercise should be initiated or continued.   [Electronically signed] 01/08/2019 10:21 AM  Shelva Majestic MD, [facility], [place], American Board of Sleep Medicine   NPI: [id number] [place] PH: [phone]   FX: [phone] [place]

## 2019-01-10 ENCOUNTER — Telehealth: Payer: Self-pay | Admitting: *Deleted

## 2019-01-10 ENCOUNTER — Other Ambulatory Visit: Payer: Self-pay | Admitting: Cardiovascular Disease

## 2019-01-10 DIAGNOSIS — G4733 Obstructive sleep apnea (adult) (pediatric): Secondary | ICD-10-CM

## 2019-01-10 DIAGNOSIS — I10 Essential (primary) hypertension: Secondary | ICD-10-CM

## 2019-01-10 DIAGNOSIS — I480 Paroxysmal atrial fibrillation: Secondary | ICD-10-CM

## 2019-01-10 DIAGNOSIS — J439 Emphysema, unspecified: Secondary | ICD-10-CM

## 2019-01-10 NOTE — Telephone Encounter (Signed)
-----   Message from Troy Sine, MD sent at 01/08/2019 10:27 AM EST ----- Mariann Laster, please notify the patient.  The patient continued to have significant events including centrals with CPAP and BiPAP.  Recommend ASV titration study.

## 2019-01-10 NOTE — Telephone Encounter (Signed)
Patient notified of CPAP/BIPAP titration study results. She hesiantly agrees to proceed with ASV titration study scheduled for 02/07/19.

## 2019-01-24 ENCOUNTER — Other Ambulatory Visit: Payer: Self-pay | Admitting: *Deleted

## 2019-01-24 MED ORDER — APIXABAN 5 MG PO TABS: 5.0000 mg | ORAL_TABLET | Freq: Two times a day (BID) | ORAL | 2 refills | Status: DC

## 2019-01-26 ENCOUNTER — Other Ambulatory Visit: Payer: Self-pay | Admitting: Internal Medicine

## 2019-01-27 MED ORDER — ALBUTEROL SULFATE HFA 108 (90 BASE) MCG/ACT IN AERS: 2.0000 | INHALATION_SPRAY | RESPIRATORY_TRACT | 3 refills | Status: DC | PRN

## 2019-02-02 DIAGNOSIS — L989 Disorder of the skin and subcutaneous tissue, unspecified: Secondary | ICD-10-CM | POA: Diagnosis not present

## 2019-02-07 ENCOUNTER — Encounter (HOSPITAL_BASED_OUTPATIENT_CLINIC_OR_DEPARTMENT_OTHER): Payer: Medicare Other

## 2019-02-16 ENCOUNTER — Telehealth: Payer: Self-pay | Admitting: Internal Medicine

## 2019-02-16 NOTE — Telephone Encounter (Signed)
Pt reports she had an "episode" yesterday lasting about 3 hours.. she felt "shaky" around 2pm at 4:30 pm her BP was 184/88 HR94, rechecked at 6pm and her BP 180/89 HR92  She took her Metoprolol 2 hours easy and her BP came down to 134/89 HR 90...she says today it is 140/67 HR73... she is feeling better.. pt says she used to have these issues several months ago but has been doing well until it happened yesterday... she is asking if when this happens if again, can she take an extra 1/2 of her metoprolol as PRN use?  Will forward to Dr. Debara Pickett.   Pt denies recent illness, no chest pain, dizziness.. hs been doing well.

## 2019-02-16 NOTE — Telephone Encounter (Signed)
  Patient is calling because she had an "episode" yesterday where her BP goes up and HR goes up and she feels real weak and shaky. She would like to speak to the nurse.

## 2019-02-16 NOTE — Telephone Encounter (Signed)
Yes .. ok to use the extra 1/2 metoprolol PRN for elevated HR and/or BP.  Dr. Lemmie Evens

## 2019-02-16 NOTE — Telephone Encounter (Signed)
Pt advised.. Dr. Lysbeth Penner recommendation.

## 2019-02-17 DIAGNOSIS — D485 Neoplasm of uncertain behavior of skin: Secondary | ICD-10-CM | POA: Diagnosis not present

## 2019-02-17 DIAGNOSIS — C44529 Squamous cell carcinoma of skin of other part of trunk: Secondary | ICD-10-CM | POA: Diagnosis not present

## 2019-02-18 ENCOUNTER — Other Ambulatory Visit: Payer: Self-pay | Admitting: Emergency Medicine

## 2019-02-21 ENCOUNTER — Telehealth: Payer: Self-pay | Admitting: Internal Medicine

## 2019-02-21 NOTE — Telephone Encounter (Signed)
° ° °  Pt c/o Shortness Of Breath: STAT if SOB developed within the last 24 hours or pt is noticeably SOB on the phone  1. Are you currently SOB (can you hear that pt is SOB on the phone)? no 2. How long have you been experiencing SOB? Weeks off and on  3. Are you SOB when sitting or when up moving around? Moving around  4. Are you currently experiencing any other symptoms? HR 59, BP 126/62, back pain, weakness at times

## 2019-02-21 NOTE — Telephone Encounter (Signed)
Spoke with pt who called in to get appt set up. States there was some confusion and she was under the impression that she had a choice b/n OV and virtual visit, but now that she understands the situation she would like to set up virtual visit. Informed pt that message would be routed to Dr. Debara Pickett nurse for appt set up. She states that she had one short episode of shakiness and weakness lasting about 30 min which happened yesterday and that it is the same type of "episode" she reported to a nurse on 4/15 (note in epic; reviewed note with pt and pt confirmed info).  Pt states her BP elevated during episode but she is not currently at home and unable to remember readings to provide for nurse. She states that during her episodes she sually sits down until it goes away. She also states that she had 'discernible Afib for half hr' yesterday. She takes all meds as Rx. Pt also c/o 'off and on' back, shoulder, and neck pain which at times may be accompanied by SOB. Today BP 126/62 HR 59. Pt would like MD recommendations. Informed pt that message would be routed to Dr. Debara Pickett and his nurse. Pt verbalized understanding

## 2019-02-23 NOTE — Telephone Encounter (Signed)

## 2019-02-23 NOTE — Telephone Encounter (Signed)
Noted .. we'll discuss over eVisit on 4/30.  Dr. Lemmie Evens

## 2019-03-02 ENCOUNTER — Telehealth: Payer: Self-pay | Admitting: Internal Medicine

## 2019-03-02 NOTE — Telephone Encounter (Signed)
Smartphone/consent/ my chart/ pre reg completed °

## 2019-03-03 ENCOUNTER — Telehealth (INDEPENDENT_AMBULATORY_CARE_PROVIDER_SITE_OTHER): Payer: Medicare Other | Admitting: Internal Medicine

## 2019-03-03 ENCOUNTER — Encounter: Payer: Self-pay | Admitting: Internal Medicine

## 2019-03-03 VITALS — BP 116/58 | HR 63 | Ht 63.0 in | Wt 140.0 lb

## 2019-03-03 DIAGNOSIS — C801 Malignant (primary) neoplasm, unspecified: Secondary | ICD-10-CM | POA: Diagnosis not present

## 2019-03-03 DIAGNOSIS — I480 Paroxysmal atrial fibrillation: Secondary | ICD-10-CM | POA: Diagnosis not present

## 2019-03-03 DIAGNOSIS — J438 Other emphysema: Secondary | ICD-10-CM | POA: Diagnosis not present

## 2019-03-03 DIAGNOSIS — I429 Cardiomyopathy, unspecified: Secondary | ICD-10-CM | POA: Diagnosis not present

## 2019-03-03 DIAGNOSIS — I10 Essential (primary) hypertension: Secondary | ICD-10-CM | POA: Diagnosis not present

## 2019-03-03 DIAGNOSIS — E785 Hyperlipidemia, unspecified: Secondary | ICD-10-CM | POA: Diagnosis not present

## 2019-03-03 DIAGNOSIS — Z8679 Personal history of other diseases of the circulatory system: Secondary | ICD-10-CM | POA: Diagnosis not present

## 2019-03-03 DIAGNOSIS — G4736 Sleep related hypoventilation in conditions classified elsewhere: Secondary | ICD-10-CM | POA: Diagnosis not present

## 2019-03-03 DIAGNOSIS — IMO0002 Reserved for concepts with insufficient information to code with codable children: Secondary | ICD-10-CM

## 2019-03-03 DIAGNOSIS — F410 Panic disorder [episodic paroxysmal anxiety] without agoraphobia: Secondary | ICD-10-CM | POA: Diagnosis not present

## 2019-03-03 DIAGNOSIS — M545 Low back pain: Secondary | ICD-10-CM | POA: Diagnosis not present

## 2019-03-03 NOTE — Progress Notes (Signed)
Virtual Visit via Video Note   This visit type was conducted due to national recommendations for restrictions regarding the COVID-19 Pandemic (e.g. social distancing) in an effort to limit this patient's exposure and mitigate transmission in our community.  Due to her co-morbid illnesses, this patient is at least at moderate risk for complications without adequate follow up.  This format is felt to be most appropriate for this patient at this time.  All issues noted in this document were discussed and addressed.  A limited physical exam was performed with this format.  Please refer to the patient's chart for her consent to telehealth for [facility].   Evaluation Performed:  Doximity video visit  Date:  03/03/2019   ID:  Holly Benson, DOB 04/16/1933, MRN [id number]  Patient Location:  [place] [address]  Provider location:   [address], [place] [address] [place], [place] [address]  PCP:  London Pepper, MD  Cardiologist:  Pixie Casino, MD Electrophysiologist:  None   Chief Complaint:  No complaints  History of Present Illness:    Holly Benson is a 85 y.o. female who presents via audio/video conferencing for a telehealth visit today.  [name] was seen today via a doc 64 video visit.  Over the past several weeks she had called in the office and had complaints of spikes in her blood pressure, feeling fatigue and shortness of breath.  She also may have had some worsening palpitations.  She asked if she could take an additional half tablet of metoprolol for her symptoms.  She was instructed to do that she took it several times with some improvement.  Now, over the past week she is done very well.  She seems to have come out of this episode and has been feeling very well.  Her blood pressure is well controlled.  She feels like her heart rate is regular.  I suspect these episodes may be atrial fibrillation.  Although she only had this 1 episode that lasted for several  weeks, she had not had any other episodes like this in the past 6 months.  She was short of breath and had some fatigue in January.  She underwent stress testing in February which was low risk for ischemia.  Her EKG in January when she saw [name], PA-C, was sinus rhythm.  She is contemplating on going on a trip to [place] to see her son in the next few weeks, which I advised against if it was not absolutely necessary due to her high risk of decompensation related to COVID-19 infection if she were to become infected.  The patient does not have symptoms concerning for COVID-19 infection (fever, chills, cough, or new SHORTNESS OF BREATH).    Prior CV studies:   The following studies were reviewed today:  Lab work Recent Psychologist, clinical triage notes  PMHx:  Past Medical History:  Diagnosis Date   Atrial fibrillation ([place])    COPD (chronic obstructive pulmonary disease) ([place])    Hyperlipidemia    Hypertension     No past surgical history on file.  FAMHx:  Family History  Problem Relation Age of Onset   Suicidality Father    Stroke Maternal Grandfather     SOCHx:   reports that she quit smoking about 27 years ago. She has a 45.00 pack-year smoking history. She has never used smokeless tobacco. She reports that she does not use drugs. No history on file for alcohol.  ALLERGIES:  Allergies  Allergen Reactions   Lovenox [Enoxaparin Sodium] Hives   Amlodipine     Muscle pain   Metoprolol     Muscle aches    MEDS:  Current Meds  Medication Sig   albuterol (PROVENTIL HFA;VENTOLIN HFA) 108 (90 Base) MCG/ACT inhaler Inhale 2 puffs into the lungs every 4 (four) hours as needed for wheezing or shortness of breath.   apixaban (ELIQUIS) 5 MG TABS tablet Take 1 tablet (5 mg total) by mouth 2 (two) times daily.   clonazePAM (KLONOPIN) 0.5 MG tablet Take 0.5 tablets by mouth as needed.   diltiazem (CARDIZEM CD) 240 MG 24 hr capsule Take 240 mg by mouth daily.      hydrochlorothiazide (MICROZIDE) 12.5 MG capsule TAKE ONE CAPSULE BY MOUTH DAILY   losartan (COZAAR) 100 MG tablet TAKE ONE TABLET BY MOUTH EVERY MORNING   metoprolol succinate (TOPROL-XL) 25 MG 24 hr tablet TAKE 1/2 TABLET BY MOUTH DAILY   pravastatin (PRAVACHOL) 40 MG tablet Take 40 mg by mouth daily.   STIOLTO RESPIMAT 2.5-2.5 MCG/ACT AERS USE 2 INHALATIONS ORALLY   EVERY MORNING     ROS: Pertinent items noted in HPI and remainder of comprehensive ROS otherwise negative.  Labs/Other Tests and Data Reviewed:    Recent Labs: 12/01/2018: BUN 26; Creatinine, Ser 0.95; Hemoglobin 14.2; Platelets 374; Potassium 4.1; Sodium 138; TSH 3.080   Recent Lipid Panel No results found for: CHOL, TRIG, HDL, CHOLHDL, LDLCALC, LDLDIRECT  Wt Readings from Last 3 Encounters:  03/03/19 140 lb (63.5 kg)  12/31/18 130 lb (59 kg)  12/27/18 130 lb (59 kg)     Exam:    Vital Signs:  BP (!) 116/58    Pulse 63    Ht 5\' 3"  (1.6 m)    Wt 140 lb (63.5 kg)    BMI 24.80 kg/m    General appearance: alert, appears stated age and no distress Lungs: No audible wheezing or visual respiratory difficulty Abdomen: soft, non-tender; bowel sounds normal; no masses,  no organomegaly Extremities: extremities normal, atraumatic, no cyanosis or edema Skin: Skin color, texture, turgor normal. No rashes or lesions Neurologic: Grossly normal Psych: Pleasant  ASSESSMENT & PLAN:    1. History of atrial fibrillation, status post DCCV 2. Paroxysmal atrial flutter 3. Palpitations 4. CHADSVASC score of 4 - on Eliquis 5. Hypertension  6. Dyslipidemia  7. OSA - awaiting APV titration  [name] has had some recent symptoms of fatigue, shortness of breath and elevated blood pressure with palpitations which may represent paroxysmal atrial fibrillation or flutter.  About a week ago she said she "came out of the episode".  During that time however she required taking extra metoprolol which seemed to help her symptoms.   Now her blood pressure and heart rate are well controlled.  She is anticoagulated on Eliquis.  This is the only significant episode she is had in several months.  We will need to monitor this and consider having her wear a monitor to see if she is having paroxysmal arrhythmias as she may be candidate for antiarrhythmic therapy.  She is awaiting APV titration for her sleep apnea.  Also, advised against traveling to [place] to see her son in the next few weeks due to increased risk of COVID-19 decompensation and travel both via the airlines or by vehicle and staying in hotels.  COVID-19 Education: The signs and symptoms of COVID-19 were discussed with the patient and how to seek care for testing (follow up with PCP or arrange E-visit).  The importance of social distancing was discussed today.  Patient Risk:   After full review of this patients clinical status, I feel that they are at least moderate risk at this time.  Time:   Today, I have spent 25 minutes with the patient with telehealth technology discussing blood pressure management, A. fib, recent nuclear stress testing, sleep apnea and recommendations for APV titration.     Medication Adjustments/Labs and Tests Ordered: Current medicines are reviewed at length with the patient today.  Concerns regarding medicines are outlined above.   Tests Ordered: No orders of the defined types were placed in this encounter.   Medication Changes: No orders of the defined types were placed in this encounter.   Disposition:  in 6 month(s)  Pixie Casino, MD, [facility], [place] Director of the Advanced Lipid Disorders &  Cardiovascular Risk Reduction Clinic Diplomate of the American Board of Clinical Lipidology Attending Cardiologist  Direct Dial: [phone]   Fax: [phone]  Website:  [email or web address]  Pixie Casino, MD  03/03/2019 9:44 AM

## 2019-03-03 NOTE — Patient Instructions (Signed)
Medication Instructions:  Your Physician recommend you continue on your current medication as directed.    If you need a refill on your cardiac medications before your next appointment, please call your pharmacy.   Lab work: None  Testing/Procedures: None  Follow-Up: At [facility], you and your health needs are our priority.  As part of our continuing mission to provide you with exceptional heart care, we have created designated Provider Care Teams.  These Care Teams include your primary Cardiologist (physician) and Advanced Practice Providers (APPs -  Physician Assistants and Nurse Practitioners) who all work together to provide you with the care you need, when you need it. You will need a follow up appointment in 6 months.  Please call our office 2 months in advance to schedule this appointment.  You may see Pixie Casino, MD or one of the following Advanced Practice Providers on your designated Care Team: [place], [place] . Fabian Sharp, PA-C

## 2019-03-10 ENCOUNTER — Encounter (HOSPITAL_BASED_OUTPATIENT_CLINIC_OR_DEPARTMENT_OTHER): Payer: Medicare Other

## 2019-04-27 ENCOUNTER — Other Ambulatory Visit: Payer: Self-pay | Admitting: Internal Medicine

## 2019-04-27 DIAGNOSIS — I429 Cardiomyopathy, unspecified: Secondary | ICD-10-CM

## 2019-05-04 ENCOUNTER — Other Ambulatory Visit: Payer: Self-pay | Admitting: Emergency Medicine

## 2019-05-04 NOTE — Telephone Encounter (Signed)
Open n error °

## 2019-05-31 ENCOUNTER — Ambulatory Visit: Payer: Medicare Other | Admitting: Emergency Medicine

## 2019-06-14 ENCOUNTER — Ambulatory Visit (INDEPENDENT_AMBULATORY_CARE_PROVIDER_SITE_OTHER): Payer: Medicare Other | Admitting: Emergency Medicine

## 2019-06-14 ENCOUNTER — Other Ambulatory Visit: Payer: Self-pay

## 2019-06-14 ENCOUNTER — Encounter: Payer: Self-pay | Admitting: Emergency Medicine

## 2019-06-14 DIAGNOSIS — J438 Other emphysema: Secondary | ICD-10-CM | POA: Diagnosis not present

## 2019-06-14 DIAGNOSIS — L570 Actinic keratosis: Secondary | ICD-10-CM | POA: Diagnosis not present

## 2019-06-14 DIAGNOSIS — R059 Cough, unspecified: Secondary | ICD-10-CM

## 2019-06-14 DIAGNOSIS — R05 Cough: Secondary | ICD-10-CM

## 2019-06-14 DIAGNOSIS — L905 Scar conditions and fibrosis of skin: Secondary | ICD-10-CM | POA: Diagnosis not present

## 2019-06-14 DIAGNOSIS — Z85828 Personal history of other malignant neoplasm of skin: Secondary | ICD-10-CM | POA: Diagnosis not present

## 2019-06-14 NOTE — Assessment & Plan Note (Signed)
With contributions of both GERD and allergic rhinitis.  Both are moderately well controlled.  Talked to her today about changing the Zyrtec to every day instead of just the fall and spring.  Consider changing your Zyrtec to use it once every day instead of just in the allergy months.  This may help your congestion, cough, breathing. Congratulations on adhering to your reflux diet.  Keep up the good work.

## 2019-06-14 NOTE — Progress Notes (Signed)
Subjective:    Patient ID: Holly Benson, female    DOB: 1932/11/24, 85 y.o.   MRN: [id number]  COPD She complains of cough. There is no shortness of breath or wheezing. Pertinent negatives include no ear pain, fever, headaches, postnasal drip, rhinorrhea, sneezing, sore throat or trouble swallowing. Her past medical history is significant for COPD.    ROV 02/10/18 --patient has a history of tobacco use (45 pack years) and associated COPD.  She also has chronic cough that is impacted by occasional GERD.  She is followed elsewhere for hypertension and atrial fibrillation. She reports today that she has been having periods of shakiness in the afternoons, has been having her meds adjusted including her metoprolol. She measures her BP at these times (usually 3-4pm), notes that it is 100's SBP, HR in the 110's. After another month's trial she went back to Spiriva and Symbicort because she thought they worked better. She only rarely has any reflux, rarely uses any GERD meds. Cough is better. She rarely needs albuterol.   ROV 08/30/18 --85 year old woman who follows up today for her history of COPD and chronic cough.  This in the setting of GERD, some allergic rhinitis.  She also has hypertension, atrial fibrillation.  She has been on Spiriva/Symbicort in the past, most recently trial of Stiolto. She prefers the [employer], is doing well on it.   She was able to travel to [place] recently, did well, had some increased Albuterol use there. Her GERD appears to be controlled w diet. No flares since last time. She uses albuterol. She is having a lot of PND, especially at night. She hopes to avoid meds for this. She has had PNA vaccine in the past, in [place].   ROV 06/14/2019 --Holly Benson is a former smoker with associated COPD and chronic cough.  She has GERD and allergic rhinitis, atrial fibrillation and hypertension.  She has been managed with Stiolto, uses albuterol approximately occasionally, often it is first  thing in the am. She has more congestion at these times.  She reports that her cough is still happening daily, clear mucous.  Not currently on medical therapy for rhinitis or GERD. She uses zyrtec in the Spring and Fall. She has complied with GERD diet. No flares, no pred or abx.    Review of Systems  Constitutional: Negative for fever and unexpected weight change.  HENT: Negative for congestion, dental problem, ear pain, nosebleeds, postnasal drip, rhinorrhea, sinus pressure, sneezing, sore throat and trouble swallowing.   Eyes: Negative for redness and itching.  Respiratory: Positive for cough. Negative for chest tightness, shortness of breath and wheezing.   Cardiovascular: Negative for palpitations and leg swelling.  Gastrointestinal: Negative for nausea and vomiting.  Genitourinary: Negative for dysuria.  Musculoskeletal: Negative for joint swelling.  Skin: Negative for rash.  Neurological: Negative for headaches.  Hematological: Does not bruise/bleed easily.  Psychiatric/Behavioral: Negative for dysphoric mood. The patient is not nervous/anxious.     Past Medical History:  Diagnosis Date  . Atrial fibrillation ([place])   . COPD (chronic obstructive pulmonary disease) ([place])   . Hyperlipidemia   . Hypertension      Family History  Problem Relation Age of Onset  . Suicidality Father   . Stroke Maternal Grandfather      Social History   Socioeconomic History  . Marital status: Widowed    Spouse name: Not on file  . Number of children: Not on file  . Years of education: Not on file  .  Highest education level: Not on file  Occupational History  . Not on file  Social Needs  . Financial resource strain: Not on file  . Food insecurity    Worry: Not on file    Inability: Not on file  . Transportation needs    Medical: Not on file    Non-medical: Not on file  Tobacco Use  . Smoking status: Former Smoker    Packs/day: 1.50    Years: 30.00    Pack years: 45.00    Quit  date: 11/04/1991    Years since quitting: 27.6  . Smokeless tobacco: Never Used  Substance and Sexual Activity  . Alcohol use: Not on file  . Drug use: No  . Sexual activity: Not on file  Lifestyle  . Physical activity    Days per week: Not on file    Minutes per session: Not on file  . Stress: Not on file  Relationships  . Social Herbalist on phone: Not on file    Gets together: Not on file    Attends religious service: Not on file    Active member of club or organization: Not on file    Attends meetings of clubs or organizations: Not on file    Relationship status: Not on file  . Intimate partner violence    Fear of current or ex partner: Not on file    Emotionally abused: Not on file    Physically abused: Not on file    Forced sexual activity: Not on file  Other Topics Concern  . Not on file  Social History Narrative  . 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  No known TB  Allergies  Allergen Reactions  . Lovenox [Enoxaparin Sodium] Hives  . Amlodipine     Muscle pain  . Metoprolol     Muscle aches     Outpatient Medications Prior to Visit  Medication Sig Dispense Refill  . albuterol (PROVENTIL HFA;VENTOLIN HFA) 108 (90 Base) MCG/ACT inhaler Inhale 2 puffs into the lungs every 4 (four) hours as needed for wheezing or shortness of breath. 1 Inhaler 3  . apixaban (ELIQUIS) 5 MG TABS tablet Take 1 tablet (5 mg total) by mouth 2 (two) times daily. 180 tablet 2  . clonazePAM (KLONOPIN) 0.5 MG tablet Take 0.5 tablets by mouth as needed.    . diltiazem (CARDIZEM CD) 240 MG 24 hr capsule Take 240 mg by mouth daily.     . hydrochlorothiazide (MICROZIDE) 12.5 MG capsule TAKE ONE CAPSULE BY MOUTH DAILY 90 capsule 3  . losartan (COZAAR) 100 MG tablet TAKE ONE TABLET BY MOUTH EVERY MORNING 90 tablet 2  . metoprolol succinate (TOPROL-XL) 25 MG 24 hr tablet TAKE 1/2 TABLET BY MOUTH DAILY 45 tablet 3  . pravastatin (PRAVACHOL) 40  MG tablet Take 40 mg by mouth daily.    Marland Kitchen STIOLTO RESPIMAT 2.5-2.5 MCG/ACT AERS USE 2 INHALATIONS ORALLY   EVERY MORNING 12 g 2   No facility-administered medications prior to visit.         Objective:   Physical Exam Vitals:   06/14/19 1143  BP: 116/72  Pulse: 66  SpO2: 96%  Weight: 139 lb (63 kg)  Height: 5' 3.5" (1.613 m)   Gen: Pleasant, thin woman, in no distress,  normal affect, mild kyphosis  ENT: No lesions,  mouth clear,  oropharynx clear, no postnasal drip  Neck: No JVD, no stridor  Lungs: No use of accessory muscles, clear bilaterally, no wheeze on forced exp  Cardiovascular: RRR, heart sounds normal, no murmur or gallops, no peripheral edema  Musculoskeletal: No deformities, no cyanosis or clubbing  Neuro: alert, non focal  Skin: Warm, no lesions or rashes      Assessment & Plan:  COPD (chronic obstructive pulmonary disease) ([facility]) Well-managed at this time, no flares.  She believes that she is benefited from the [employer].  We will continue.  Please continue your Stiolto as you have been taking it. Keep albuterol available to use 2 puffs if needed for shortness of breath, chest tightness, wheezing. Get the flu shot in the fall. Follow with Dr. Lamonte Sakai in 12 months or sooner if you have any problems.   Cough With contributions of both GERD and allergic rhinitis.  Both are moderately well controlled.  Talked to her today about changing the Zyrtec to every day instead of just the fall and spring.  Consider changing your Zyrtec to use it once every day instead of just in the allergy months.  This may help your congestion, cough, breathing. Congratulations on adhering to your reflux diet.  Keep up the good work.  Baltazar Apo, MD, PhD 06/14/2019, 12:04 PM [facility] [phone] or if no answer [phone]

## 2019-06-14 NOTE — Patient Instructions (Signed)
Please continue your Stiolto as you have been taking it. Keep albuterol available to use 2 puffs if needed for shortness of breath, chest tightness, wheezing. Consider changing your Zyrtec to use it once every day instead of just in the allergy months.  This may help your congestion, cough, breathing. Congratulations on adhering to your reflux diet.  Keep up the good work. Get the flu shot in the fall. Follow with Dr. Lamonte Sakai in 12 months or sooner if you have any problems.

## 2019-06-14 NOTE — Assessment & Plan Note (Signed)
Well-managed at this time, no flares.  She believes that she is benefited from the [employer].  We will continue.  Please continue your Stiolto as you have been taking it. Keep albuterol available to use 2 puffs if needed for shortness of breath, chest tightness, wheezing. Get the flu shot in the fall. Follow with Dr. Lamonte Sakai in 12 months or sooner if you have any problems.

## 2019-06-15 ENCOUNTER — Telehealth: Payer: Self-pay | Admitting: Internal Medicine

## 2019-06-15 NOTE — Telephone Encounter (Signed)
Have her stop the losartan and monitor bp.  Dr Lemmie Evens

## 2019-06-15 NOTE — Telephone Encounter (Signed)
LM on VM (per DPR) with MD recommendations

## 2019-06-15 NOTE — Telephone Encounter (Signed)
Returned call to patient of Dr. Debara Pickett.   She reports consistently low BP since early July. Most BPs have been in low 161W systolic and some into the 90s. HR in 60s/70s. She tried to hold metoprolol succinate for about 1 week with NO increase in BP but this affected her HR so she resumed. On 8/5, she decided to decrease losartan to 50mg  QAM and she reports BP is still low with decreasing her losartan dose.   She reports SOB, exhausted with minimal exertion.   Advised will route to Dr. Debara Pickett for advice

## 2019-06-15 NOTE — Telephone Encounter (Signed)
Pt c/o BP issue: STAT if pt c/o blurred vision, one-sided weakness or slurred speech  1. What are your last 5 BP readings? 92/47 ay  104/54 92/47, 102/62, 108/65 104/61  2. Are you having any other symptoms (ex. Dizziness, headache, blurred vision, passed out)? So tired, just exhausted and some shortness of breath  3. What is your BP issue?  Blood Presure is running- Pt takes Losartan, she cut it to 50 mg now

## 2019-06-15 NOTE — Telephone Encounter (Signed)
Sent MyChart message with MD advice

## 2019-06-23 ENCOUNTER — Encounter: Payer: Self-pay | Admitting: Gastroenterology

## 2019-06-28 ENCOUNTER — Telehealth: Payer: Self-pay | Admitting: Internal Medicine

## 2019-06-28 NOTE — Telephone Encounter (Signed)
New Message   Patient states that she was taken off the losartan and was told to call back and advise how she is feeling. Patient states that she is still feeling wiped out and her BP was 101/64 when she last checked. Please call.

## 2019-06-28 NOTE — Telephone Encounter (Signed)
Attempted to contact patient- number given did not ring.  Will route to Primary to make MD aware that patient is calling back as requested, althought not feeling well, and BP listed below. Recommendations? Thank you!

## 2019-06-28 NOTE — Telephone Encounter (Signed)
BP improved - but if she is still symptomatic, will need an in-office visit with me or APP.  Dr Lemmie Evens

## 2019-06-29 DIAGNOSIS — M542 Cervicalgia: Secondary | ICD-10-CM | POA: Diagnosis not present

## 2019-06-29 DIAGNOSIS — M25561 Pain in right knee: Secondary | ICD-10-CM | POA: Diagnosis not present

## 2019-06-29 DIAGNOSIS — M545 Low back pain: Secondary | ICD-10-CM | POA: Diagnosis not present

## 2019-06-29 NOTE — Telephone Encounter (Signed)
Returned call to patient. She is at another doctor's appointment and will call back

## 2019-06-29 NOTE — Telephone Encounter (Signed)
Spoke with patient who reports her BP is doing a little better. Typically runs 110s-120s/60s-70s. She is SOB, no energy. She can't do much without getting fatigued. She has been scheduled for APP visit on 07/05/2019 @ 3:45pm. Offered MD appointment on 07/04/2019 @ 1130 or 1145 but patient cannot make this d/t another appointment.

## 2019-06-29 NOTE — Telephone Encounter (Signed)
Patient called back returning a call from our office. She will be home the rest of the day

## 2019-07-01 ENCOUNTER — Telehealth: Payer: Self-pay | Admitting: Internal Medicine

## 2019-07-01 NOTE — Telephone Encounter (Signed)
Called patient, advised of message from PharmD.  Patient verbalized understanding.   

## 2019-07-01 NOTE — Telephone Encounter (Signed)
Attempted to contact patient, LVM and call back number.

## 2019-07-01 NOTE — Telephone Encounter (Signed)
New message  STAT if HR is under 50 or over 120 (normal HR is 60-100 beats per minute)  1) What is your heart rate?111  2) Do you have a log of your heart rate readings (document readings)? yes  3) Do you have any other symptoms? No   

## 2019-07-01 NOTE — Telephone Encounter (Signed)
Patient states she has continued to have increased heart rates, and her BP has been all over the place. She would like to know if there is anything different to take.  She took an extra half dose of metoprolol last night and it did not do anything for her.   117/69 HR 115 147/93 HR 123 115/60 HR 111  Patient denies any symptoms currently- she states that she is SOB but is not any different than normal. Patient denies being under any excess stress or anxiety.  She was advised when HR becomes elevated to take deep breaths and try to relax, to bring her BP and HR down, but advised I would route to PharmD for any other recommendations with medications.

## 2019-07-01 NOTE — Telephone Encounter (Signed)
Please increase metoprolol dose to 25mg  (1 tablet) daily. Will take 2-3 days to see improvement in HR.  If HR not improved by Monday, please call back to schedule follow up with APP/MD.

## 2019-07-04 NOTE — Progress Notes (Signed)
Cardiology Office Note   Date:  07/05/2019   ID:  Jadden Prioleau, DOB 07-15-1933, MRN [id number]  PCP:  London Pepper, MD  Cardiologist:  Debara Pickett  CC: Atrial flutter   History of Present Illness: Holly Benson is a 85 y.o. female who presents for hypertension, atrial fibrillation, status DCCV/flutter, while living in [place], on Eliquis,, and hyperlipidemia with other history to include OSA on CPAP, and COPD.  The patient was last seen by Dr. Debara Pickett on 03/03/2019 at which time she continued to have complaints of "spikes in her blood pressure" along with mild shortness of breath and fatigue.  She had planned on visiting her son in [place] but was advised not to do so due to COVID restrictions.  It was felt that her recent symptoms of fatigue shortness of breath and elevated blood pressure with palpitations may be representative of PAF or flutter.  It was recommended that she wear heart monitor to evaluate her symptoms however,  she was awaiting a PV titration for her history of sleep apnea.  Therefore monitor was not placed.  She is due to finish up her sleep studies on July 14, 2019.  [name] comes today exhausted.  She states that since she has returned from [place] over the last 3 weeks her heart rate has been more difficult to control and she has been much more fatigued and short of breath.  She did call our office last week about her rapid heart rhythm and was advised to increase her metoprolol from 12.5 mg to 25 mg daily which she has done.  However, she states that she does not feel better.  EKG reveals atrial flutter with variable conduction here in the office today with a rate of 99 bpm.  She has been compliant with her Eliquis and diltiazem.  She denies any bleeding, hemoptysis, or melena.  Her main complaint is shortness of breath and fatigue.  Past Medical History:  Diagnosis Date   Atrial fibrillation ([place])    COPD (chronic obstructive pulmonary disease) ([place])     Hyperlipidemia    Hypertension     No past surgical history on file.   Current Outpatient Medications  Medication Sig Dispense Refill   albuterol (PROVENTIL HFA;VENTOLIN HFA) 108 (90 Base) MCG/ACT inhaler Inhale 2 puffs into the lungs every 4 (four) hours as needed for wheezing or shortness of breath. 1 Inhaler 3   apixaban (ELIQUIS) 5 MG TABS tablet Take 1 tablet (5 mg total) by mouth 2 (two) times daily. 180 tablet 2   clonazePAM (KLONOPIN) 0.5 MG tablet Take 0.5 tablets by mouth as needed.     hydrochlorothiazide (MICROZIDE) 12.5 MG capsule TAKE ONE CAPSULE BY MOUTH DAILY 90 capsule 3   metoprolol succinate (TOPROL-XL) 25 MG 24 hr tablet Take 12.5 mg by mouth daily.      pravastatin (PRAVACHOL) 40 MG tablet Take 40 mg by mouth daily.     STIOLTO RESPIMAT 2.5-2.5 MCG/ACT AERS USE 2 INHALATIONS ORALLY   EVERY MORNING 12 g 2   diltiazem (CARDIZEM CD) 300 MG 24 hr capsule Take 1 capsule (300 mg total) by mouth daily. 90 capsule 3   No current facility-administered medications for this visit.     Allergies:   Lovenox [enoxaparin sodium], Amlodipine, and Metoprolol    Social History:  The patient  reports that she quit smoking about 27 years ago. She has a 45.00 pack-year smoking history. She has never used smokeless tobacco. She reports that she does not use drugs.  Family History:  The patient's family history includes Stroke in her maternal grandfather; Suicidality in her father.    ROS: All other systems are reviewed and negative. Unless otherwise mentioned in H&P    PHYSICAL EXAM: VS:  BP 128/80    Pulse 98    Ht 5\' 4"  (1.626 m)    Wt 140 lb 9.6 oz (63.8 kg)    BMI 24.13 kg/m  , BMI Body mass index is 24.13 kg/m. GEN: Well nourished, well developed, in no acute distress HEENT: normal Neck: no JVD, carotid bruits, or masses Cardiac: IRRR; no murmurs, rubs, or gallops,no edema  Respiratory:  Clear to auscultation bilaterally, normal work of breathing GI: soft,  nontender, nondistended, + BS MS: no deformity or atrophy Skin: warm and dry, no rash Neuro:  Strength and sensation are intact Psych: euthymic mood, full affect   EKG: Atrial flutter with variable block, from 2:1- 3:1, heart rate of 98 bpm   Recent Labs: 12/01/2018: BUN 26; Creatinine, Ser 0.95; Hemoglobin 14.2; Platelets 374; Potassium 4.1; Sodium 138; TSH 3.080    Lipid Panel No results found for: CHOL, TRIG, HDL, CHOLHDL, VLDL, LDLCALC, LDLDIRECT    Wt Readings from Last 3 Encounters:  07/05/19 140 lb 9.6 oz (63.8 kg)  06/14/19 139 lb (63 kg)  03/03/19 140 lb (63.5 kg)      Other studies Reviewed: NM Stress Test 2018/12/29  The left ventricular ejection fraction is hyperdynamic (>65%).  Nuclear stress EF: 71%.  There was downsloping of the ST segments in the inferolateral leads during infusion with no significan J point depression which rapidly resolved in early recovery.  The study is normal.  This is a low risk study.  Echocardiogram 11/18/2017  Left ventricle: The cavity size was normal. Wall thickness was   increased in a pattern of mild LVH. Systolic function was normal.   The estimated ejection fraction was in the range of 60% to 65%.   Wall motion was normal; there were no regional wall motion   abnormalities. The study is not technically sufficient to allow   evaluation of LV diastolic function. - Mitral valve: Mildly thickened leaflets . There was trivial   regurgitation. - Left atrium: Moderately dilated. - Right ventricle: The cavity size was mildly dilated. - Right atrium: Moderately dilated. - Tricuspid valve: There was moderate regurgitation. - Pulmonary arteries: PA peak pressure: 51 mm Hg (S). - Inferior vena cava: The vessel was dilated. The respirophasic   diameter changes were blunted (< 50%), consistent with elevated   central venous pressure.   ASSESSMENT AND PLAN:  1.  Symptomatic atrial flutter: The patient has had symptoms of  worsening shortness of breath and fatigue with more difficult to control heart rate since returning home from [place].  She did take the extra dose of metoprolol to 25 mg daily but has not felt any improvement in her symptoms.  She is reluctant to go up on the metoprolol as in the past beta-blockers have caused her to have significant muscle weakness.  I have spoken with Dr. Debara Pickett who knows this patient very well, and it is his recommendation that the patient be referred to electrophysiology for further recommendations and possible antiarrhythmic therapy.  He did not want to consider cardioversion as she has been in atrial flutter/fib for over a year.  The patient has been informed of this recommendation and she is willing to be seen on referral by electrophysiology.  In the interim we will decrease her metoprolol back  to 12.5 mg daily and increase her diltiazem to 300 mg daily.  She will continue to take her heart rate and blood pressure recordings and reports any significant changes to [place] in about a week.  She will continue Eliquis as directed as she has been very compliant with this.  2.  OSA: Followed by Dr. Claiborne Billings.  She continues to undergo sleep study testing and is planned for an adapted servo ventilation test on July 14, 2019 at [facility].  She plans on keeping the appointment.  3.  Hypertension: She continues on hydrochlorothiazide 12.5 mg daily along with diltiazem and metoprolol which is mostly used for heart rate control.  The patient's blood pressure is stable today we will follow-up as she is having medication adjustments, she is to keep up with her heart rate and blood pressure at home.   Current medicines are reviewed at length with the patient today.    Labs/ tests ordered today include: None  Phill Myron. West Pugh, ANP, AACC   07/05/2019 4:23 PM    [address] Office [phone] Fax [phone]

## 2019-07-05 ENCOUNTER — Encounter: Payer: Self-pay | Admitting: Adult Health

## 2019-07-05 ENCOUNTER — Ambulatory Visit (INDEPENDENT_AMBULATORY_CARE_PROVIDER_SITE_OTHER): Payer: Medicare Other | Admitting: Adult Health

## 2019-07-05 ENCOUNTER — Other Ambulatory Visit (HOSPITAL_COMMUNITY): Payer: Medicare Other

## 2019-07-05 ENCOUNTER — Other Ambulatory Visit: Payer: Self-pay

## 2019-07-05 VITALS — BP 128/80 | HR 98 | Ht 64.0 in | Wt 140.6 lb

## 2019-07-05 DIAGNOSIS — I10 Essential (primary) hypertension: Secondary | ICD-10-CM

## 2019-07-05 DIAGNOSIS — I4819 Other persistent atrial fibrillation: Secondary | ICD-10-CM

## 2019-07-05 DIAGNOSIS — G4733 Obstructive sleep apnea (adult) (pediatric): Secondary | ICD-10-CM | POA: Diagnosis not present

## 2019-07-05 DIAGNOSIS — I483 Typical atrial flutter: Secondary | ICD-10-CM

## 2019-07-05 MED ORDER — DILTIAZEM HCL ER COATED BEADS 300 MG PO CP24: 300.0000 mg | ORAL_CAPSULE | Freq: Every day | ORAL | 3 refills | Status: DC

## 2019-07-05 NOTE — Patient Instructions (Signed)
Medication Instructions:  DECREASE- Metoprolol 12.5 mg by mouth daily INCREASE- Diltiazem 300 mg by mouth daily  If you need a refill on your cardiac medications before your next appointment, please call your pharmacy.  Labwork: None Ordered   Testing/Procedures: None Ordered  Follow-Up: You have been referred to Electrophysiologist  You will need a follow up appointment in 2-3 months.  Please call our office 2 months in advance to schedule this appointment.  You may see Pixie Casino, MD or one of the following Advanced Practice Providers on your designated Care Team: [place], [place] . Fabian Sharp, PA-C     At [facility], you and your health needs are our priority.  As part of our continuing mission to provide you with exceptional heart care, we have created designated Provider Care Teams.  These Care Teams include your primary Cardiologist (physician) and Advanced Practice Providers (APPs -  Physician Assistants and Nurse Practitioners) who all work together to provide you with the care you need, when you need it.  Thank you for choosing [facility] at [facility]!!

## 2019-07-06 ENCOUNTER — Telehealth: Payer: Self-pay | Admitting: Adult Health

## 2019-07-06 MED ORDER — DILTIAZEM HCL ER COATED BEADS 300 MG PO CP24: 300.0000 mg | ORAL_CAPSULE | Freq: Every day | ORAL | 3 refills | Status: DC

## 2019-07-06 NOTE — Telephone Encounter (Signed)
This is Dr. Hilty's pt 

## 2019-07-06 NOTE — Telephone Encounter (Signed)
Diltiazem sent to local [facility].

## 2019-07-06 NOTE — Telephone Encounter (Signed)
° ° ° °*  STAT* If patient is at the pharmacy, call can be transferred to refill team.   1. Which medications need to be refilled? (please list name of each medication and dose if known) diltiazem (CARDIZEM CD) 300 MG 24 hr capsule  2. Which pharmacy/location (including street and city if local pharmacy) is medication to be sent to? [place], [place]  3. Do they need a 30 day or 90 day supply? [place]

## 2019-07-07 ENCOUNTER — Encounter (HOSPITAL_BASED_OUTPATIENT_CLINIC_OR_DEPARTMENT_OTHER): Payer: Medicare Other | Admitting: Cardiovascular Disease

## 2019-07-07 DIAGNOSIS — M542 Cervicalgia: Secondary | ICD-10-CM | POA: Diagnosis not present

## 2019-07-07 DIAGNOSIS — M545 Low back pain: Secondary | ICD-10-CM | POA: Diagnosis not present

## 2019-07-08 ENCOUNTER — Other Ambulatory Visit: Payer: Self-pay | Admitting: Internal Medicine

## 2019-07-12 ENCOUNTER — Other Ambulatory Visit (HOSPITAL_COMMUNITY)
Admission: RE | Admit: 2019-07-12 | Discharge: 2019-07-12 | Disposition: A | Discharge status: Home / Self Care | Payer: Medicare Other | Source: Ambulatory Visit | Attending: Cardiovascular Disease | Admitting: Cardiovascular Disease

## 2019-07-12 DIAGNOSIS — Z20828 Contact with and (suspected) exposure to other viral communicable diseases: Secondary | ICD-10-CM | POA: Diagnosis not present

## 2019-07-12 DIAGNOSIS — Z01812 Encounter for preprocedural laboratory examination: Secondary | ICD-10-CM | POA: Diagnosis not present

## 2019-07-12 LAB — SARS CORONAVIRUS 2 (TAT 6-24 HRS): SARS Coronavirus 2: NEGATIVE

## 2019-07-14 ENCOUNTER — Ambulatory Visit (HOSPITAL_BASED_OUTPATIENT_CLINIC_OR_DEPARTMENT_OTHER): Payer: Medicare Other | Attending: Cardiovascular Disease | Admitting: Cardiovascular Disease

## 2019-07-14 ENCOUNTER — Other Ambulatory Visit: Payer: Self-pay

## 2019-07-14 DIAGNOSIS — J439 Emphysema, unspecified: Secondary | ICD-10-CM | POA: Insufficient documentation

## 2019-07-14 DIAGNOSIS — G4761 Periodic limb movement disorder: Secondary | ICD-10-CM | POA: Insufficient documentation

## 2019-07-14 DIAGNOSIS — Z79899 Other long term (current) drug therapy: Secondary | ICD-10-CM | POA: Diagnosis not present

## 2019-07-14 DIAGNOSIS — I480 Paroxysmal atrial fibrillation: Secondary | ICD-10-CM | POA: Diagnosis not present

## 2019-07-14 DIAGNOSIS — G4733 Obstructive sleep apnea (adult) (pediatric): Secondary | ICD-10-CM | POA: Diagnosis not present

## 2019-07-14 DIAGNOSIS — Z7901 Long term (current) use of anticoagulants: Secondary | ICD-10-CM | POA: Diagnosis not present

## 2019-07-14 DIAGNOSIS — I10 Essential (primary) hypertension: Secondary | ICD-10-CM | POA: Diagnosis not present

## 2019-07-18 DIAGNOSIS — M542 Cervicalgia: Secondary | ICD-10-CM | POA: Diagnosis not present

## 2019-07-18 DIAGNOSIS — M545 Low back pain: Secondary | ICD-10-CM | POA: Diagnosis not present

## 2019-07-20 DIAGNOSIS — M545 Low back pain: Secondary | ICD-10-CM | POA: Diagnosis not present

## 2019-07-20 DIAGNOSIS — M542 Cervicalgia: Secondary | ICD-10-CM | POA: Diagnosis not present

## 2019-07-24 NOTE — Procedures (Signed)
Patient Name: Holly Benson, Holly Benson Date: 07/14/2019 Gender: Female D.O.B: 05-17-1933 Age (years): 42 Referring Provider: Shelva Majestic MD, ABSM Height (inches): 63 Interpreting Physician: Shelva Majestic MD, ABSM Weight (lbs): 130 RPSGT: Jorge Ny BMI: 23 MRN: [id number] Neck Size: 14.00  CLINICAL INFORMATION The patient is referred for a adaptive servo-ventilator titration study. Most recent polysomnogram dated 12/31/2018 revealed an AHI of 25.9/h and RDI of 27.1/h. Most recent titration study dated 12/31/2018 revealed an AHI of 43.3/h. Patient did not tolerate CPAP and developed central events with BiPAP.  MEDICATIONS     albuterol (PROVENTIL HFA;VENTOLIN HFA) 108 (90 Base) MCG/ACT inhaler         apixaban (ELIQUIS) 5 MG TABS tablet         clonazePAM (KLONOPIN) 0.5 MG tablet         diltiazem (CARDIZEM CD) 300 MG 24 hr capsule         hydrochlorothiazide (MICROZIDE) 12.5 MG capsule         metoprolol succinate (TOPROL-XL) 25 MG 24 hr tablet         pravastatin (PRAVACHOL) 40 MG tablet         STIOLTO RESPIMAT 2.5-2.5 MCG/ACT AERS      Medications self-administered by patient taken the night of the study : CLONAZEPAM  SLEEP STUDY TECHNIQUE As per the AASM Manual for the Scoring of Sleep and Associated Events v2.3 (April 2016) with a hypopnea requiring 4% desaturations.  The channels recorded and monitored were frontal, central and occipital EEG, electrooculogram (EOG), submentalis EMG (chin), nasal and oral airflow, thoracic and abdominal wall motion, anterior tibialis EMG, snore microphone, electrocardiogram, and pulse oximetry.  RESPIRATORY PARAMETERS Optimal Min IPAP (cm): 5 Optimal Max IPAP (cm): 5 Optimal Min EPAP (cm): 5 Optimal Max EPAP (cm): 5 Optimal Max Pressure (cm): 15 Optimal Min PS (cm): 4 Optimal Max PS (cm): 15 Opitmal Breathing Rate (/min): Auto Overall Min O2 (%): 86.0 Min O2 at Optimal Pressure (%): 86.0 AHI at Optimal (/hr): N/A   SLEEP  ARCHITECTURE During a recording time of 391.6 minutes, the patient slept for 264 minutes. Sleep efficiency was 67.4%%. The patient spent 9.3%% of the night in stage N1 sleep, 72.3%% in stage N2 sleep, 0.0%% in stage N3 and 18.4% in REM. Wake after sleep onset (WASO) was 113.2 minutes. Alpha intrusion was  absent. Supine sleep was 0.00%. The arousal index was 20.0.  LEG MOVEMENT DATA PLM Index (/hr): 48.2 PLM Arousal Index (/hr): 12.3  CARDIAC DATA The 2 lead EKG demonstrated atrial fibrillation. The mean heart rate was 44.8 beats per minute. Other EKG findings include: PVCs. IMPRESSIONS -Successful ASV titration. At 03/06/14 AHI was 1.1/ with O2 nadir at 76% without central events.  - No significant central sleep apnea occurred during this study (CAI = 0.0). - Moderate oxygen desaturation was noted during this study (Min O2 = 86.0). - Reduced sleep efficiency at 67%. - The patient snored with moderate snoring volume. - EKG findings include PVCs. - Clinically significant periodic limb movements did not occur during sleep.  DIAGNOSIS - Obstructive Sleep Apnea (327.23 [G47.33 ICD-10]) - Periodic Limb Movement During Sleep (327.51 [G47.61 ICD-10])  RECOMMENDATIONS - Recommend an initial trial of SV Advance EPAP Min 5 , Pressure Support Min 4 and Max 15 cmH2O and Breath Rate of Auto BrPM - Effort should be made to optimize nasal and oropharyngeal patency. - Avoid alcohol, sedatives and other CNS depressants that may worsen sleep apnea and disrupt normal sleep architecture. -  Sleep hygiene should be reviewed to assess factors that may improve sleep quality. - Weight management and regular exercise should be initiated or continued. - Recommend a download in 30 days and sleep clinic evaluation after 4 weeks of therapy  [Electronically signed] 07/24/2019 10:11 AM  Shelva Majestic MD, [facility], [place], American Board of Sleep Medicine   NPI: [id number] , 10:12 AM [place] PH: [phone]   FX: [phone] [place]

## 2019-07-25 ENCOUNTER — Ambulatory Visit (INDEPENDENT_AMBULATORY_CARE_PROVIDER_SITE_OTHER): Payer: Medicare Other | Admitting: Cardiology

## 2019-07-25 ENCOUNTER — Other Ambulatory Visit: Payer: Self-pay

## 2019-07-25 ENCOUNTER — Encounter: Payer: Self-pay | Admitting: Cardiology

## 2019-07-25 VITALS — BP 124/62 | HR 89 | Ht 63.0 in | Wt 145.2 lb

## 2019-07-25 DIAGNOSIS — I4819 Other persistent atrial fibrillation: Secondary | ICD-10-CM | POA: Diagnosis not present

## 2019-07-25 DIAGNOSIS — M542 Cervicalgia: Secondary | ICD-10-CM | POA: Diagnosis not present

## 2019-07-25 DIAGNOSIS — M545 Low back pain: Secondary | ICD-10-CM | POA: Diagnosis not present

## 2019-07-25 MED ORDER — FLECAINIDE ACETATE 50 MG PO TABS: 50.0000 mg | ORAL_TABLET | Freq: Two times a day (BID) | ORAL | 3 refills | Status: DC

## 2019-07-25 NOTE — Patient Instructions (Signed)
Medication Instructions:  Your physician has recommended you make the following change in your medication:  1. START Flecainide 50 mg TWICE a day  * If you need a refill on your cardiac medications before your next appointment, please call your pharmacy.   Labwork: None ordered  Testing/Procedures: None ordered  Follow-Up: Your physician recommends that you schedule a follow-up appointment in: 1 week for nurse visit EKG.  At [facility], you and your health needs are our priority.  As part of our continuing mission to provide you with exceptional heart care, we have created designated Provider Care Teams.  These Care Teams include your primary Cardiologist (physician) and Advanced Practice Providers (APPs -  Physician Assistants and Nurse Practitioners) who all work together to provide you with the care you need, when you need it.  You will need a follow up appointment in 1 month.  You will see one of the following Advanced Practice Providers on your designated Care Team:    Chanetta Marshall, NP  Tommye Standard, PA-C  [name], [place]  Your physician recommends that you schedule a follow-up appointment in: 3 months with Dr. Curt Bears.  Thank you for choosing [facility]!!   Trinidad Curet, RN [phone]  Any Other Special Instructions Will Be Listed Below (If Applicable). Flecainide tablets What is this medicine? FLECAINIDE (FLEK a nide) is an antiarrhythmic drug. This medicine is used to prevent irregular heart rhythm. It can also slow down fast heartbeats called tachycardia. This medicine may be used for other purposes; ask your health care provider or pharmacist if you have questions. COMMON BRAND NAME(S): Tambocor What should I tell my health care provider before I take this medicine? They need to know if you have any of these conditions:  abnormal levels of potassium in the blood  heart disease including heart rhythm and heart rate problems  kidney or liver disease   recent heart attack  an unusual or allergic reaction to flecainide, local anesthetics, other medicines, foods, dyes, or preservatives  pregnant or trying to get pregnant  breast-feeding How should I use this medicine? Take this medicine by mouth with a glass of water. Follow the directions on the prescription label. You can take this medicine with or without food. Take your doses at regular intervals. Do not take your medicine more often than directed. Do not stop taking this medicine suddenly. This may cause serious, heart-related side effects. If your doctor wants you to stop the medicine, the dose may be slowly lowered over time to avoid any side effects. Talk to your pediatrician regarding the use of this medicine in children. While this drug may be prescribed for children as young as 1 year of age for selected conditions, precautions do apply. Overdosage: If you think you have taken too much of this medicine contact a poison control center or emergency room at once. NOTE: This medicine is only for you. Do not share this medicine with others. What if I miss a dose? If you miss a dose, take it as soon as you can. If it is almost time for your next dose, take only that dose. Do not take double or extra doses. What may interact with this medicine? Do not take this medicine with any of the following medications:  amoxapine  arsenic trioxide  certain antibiotics like clarithromycin, erythromycin, gatifloxacin, gemifloxacin, levofloxacin, moxifloxacin, sparfloxacin, or troleandomycin  certain antidepressants called tricyclic antidepressants like amitriptyline, imipramine, or nortriptyline  certain medicines to control heart rhythm like disopyramide, encainide,  moricizine, procainamide, propafenone, and quinidine  cisapride  delavirdine  droperidol  haloperidol  [place]  imatinib  levomethadyl  maprotiline  medicines for malaria like chloroquine and halofantrine   pentamidine  phenothiazines like chlorpromazine, mesoridazine, prochlorperazine, thioridazine  pimozide  quinine  ranolazine  ritonavir  sertindole This medicine may also interact with the following medications:  cimetidine  dofetilide  medicines for angina or high blood pressure  medicines to control heart rhythm like amiodarone and digoxin  ziprasidone This list may not describe all possible interactions. Give your health care provider a list of all the medicines, herbs, non-prescription drugs, or dietary supplements you use. Also tell them if you smoke, drink alcohol, or use illegal drugs. Some items may interact with your medicine. What should I watch for while using this medicine? Visit your doctor or health care professional for regular checks on your progress. Because your condition and the use of this medicine carries some risk, it is a good idea to carry an identification card, necklace or bracelet with details of your condition, medications and doctor or health care professional. Check your blood pressure and pulse rate regularly. Ask your health care professional what your blood pressure and pulse rate should be, and when you should contact him or her. Your doctor or health care professional also may schedule regular blood tests and electrocardiograms to check your progress. You may get drowsy or dizzy. Do not drive, use machinery, or do anything that needs mental alertness until you know how this medicine affects you. Do not stand or sit up quickly, especially if you are an older patient. This reduces the risk of dizzy or fainting spells. Alcohol can make you more dizzy, increase flushing and rapid heartbeats. Avoid alcoholic drinks. What side effects may I notice from receiving this medicine? Side effects that you should report to your doctor or health care professional as soon as possible:  chest pain, continued irregular heartbeats  difficulty breathing  swelling of  the legs or feet  trembling, shaking  unusually weak or tired Side effects that usually do not require medical attention (report to your doctor or health care professional if they continue or are bothersome):  blurred vision  constipation  headache  nausea, vomiting  stomach pain This list may not describe all possible side effects. Call your doctor for medical advice about side effects. You may report side effects to FDA at 1-800-FDA-1088. Where should I keep my medicine? Keep out of the reach of children. Store at room temperature between 15 and 30 degrees C (59 and 86 degrees F). Protect from light. Keep container tightly closed. Throw away any unused medicine after the expiration date. NOTE: This sheet is a summary. It may not cover all possible information. If you have questions about this medicine, talk to your doctor, pharmacist, or health care provider.  2020 Elsevier/Gold Standard (2018-10-11 11:41:38)

## 2019-07-25 NOTE — Progress Notes (Signed)
Electrophysiology Office Note   Date:  07/25/2019   ID:  Holly Benson, DOB 1933-03-22, MRN [id number]  PCP:  Holly Pepper, MD  Cardiologist:  Holly Benson Primary Electrophysiologist:  Holly Krist Meredith Leeds, MD    Chief Complaint: AF/flutter   History of Present Illness: Holly Benson is a 85 y.o. female who is being seen today for the evaluation of atrial flutter at the request of Holly Colonel, NP. Presenting today for electrophysiology evaluation.  She has a history of hypertension, atrial fibrillation/flutter status post cardioversion, hyperlipidemia, OSA on CPAP, and COPD.  She has had issues with shortness of breath and fatigue as well as spikes in her blood pressure.  She was having episodes of fatigue and shortness of breath.  It was thought that this was due to atrial flutter or atrial fibrillation.  Today, she denies symptoms of palpitations, chest pain, shortness of breath, orthopnea, PND, lower extremity edema, claudication, dizziness, presyncope, syncope, bleeding, or neurologic sequela. The patient is tolerating medications without difficulties.    Past Medical History:  Diagnosis Date  . Atrial fibrillation ([place])   . COPD (chronic obstructive pulmonary disease) ([place])   . Hyperlipidemia   . Hypertension    History reviewed. No pertinent surgical history.   Current Outpatient Medications  Medication Sig Dispense Refill  . albuterol (PROVENTIL HFA;VENTOLIN HFA) 108 (90 Base) MCG/ACT inhaler Inhale 2 puffs into the lungs every 4 (four) hours as needed for wheezing or shortness of breath. 1 Inhaler 3  . apixaban (ELIQUIS) 5 MG TABS tablet Take 1 tablet (5 mg total) by mouth 2 (two) times daily. 180 tablet 2  . clonazePAM (KLONOPIN) 0.5 MG tablet Take 0.5 tablets by mouth as needed.    . diltiazem (CARDIZEM CD) 300 MG 24 hr capsule Take 1 capsule (300 mg total) by mouth daily. 90 capsule 3  . hydrochlorothiazide (MICROZIDE) 12.5 MG capsule TAKE ONE CAPSULE BY MOUTH  DAILY 90 capsule 3  . metoprolol succinate (TOPROL-XL) 25 MG 24 hr tablet Take 12.5 mg by mouth daily.     . pravastatin (PRAVACHOL) 40 MG tablet Take 40 mg by mouth daily.    Marland Kitchen STIOLTO RESPIMAT 2.5-2.5 MCG/ACT AERS USE 2 INHALATIONS ORALLY   EVERY MORNING 12 g 2  . flecainide (TAMBOCOR) 50 MG tablet Take 1 tablet (50 mg total) by mouth 2 (two) times daily. 60 tablet 3   No current facility-administered medications for this visit.     Allergies:   Lovenox [enoxaparin sodium], Amlodipine, and Metoprolol   Social History:  The patient  reports that she quit smoking about 27 years ago. She has a 45.00 pack-year smoking history. She has never used smokeless tobacco. She reports that she does not use drugs.   Family History:  The patient's family history includes Stroke in her maternal grandfather; Suicidality in her father.    ROS:  Please see the history of present illness.   Otherwise, review of systems is positive for none.   All other systems are reviewed and negative.    PHYSICAL EXAM: VS:  BP 124/62   Pulse 89   Ht 5\' 3"  (1.6 m)   Wt 145 lb 3.2 oz (65.9 kg)   SpO2 94%   BMI 25.72 kg/m  , BMI Body mass index is 25.72 kg/m. GEN: Well nourished, well developed, in no acute distress  HEENT: normal  Neck: no JVD, carotid bruits, or masses Cardiac: iRRR; no murmurs, rubs, or gallops,no edema  Respiratory:  clear to auscultation bilaterally, normal  work of breathing GI: soft, nontender, nondistended, + BS MS: no deformity or atrophy  Skin: warm and dry,  Neuro:  Strength and sensation are intact Psych: euthymic mood, full affect  EKG:  EKG is ordered today. Personal review of the ekg ordered shows atrial flutter, rate 98  Recent Labs: 12/01/2018: BUN 26; Creatinine, Ser 0.95; Hemoglobin 14.2; Platelets 374; Potassium 4.1; Sodium 138; TSH 3.080    Lipid Panel  No results found for: CHOL, TRIG, HDL, CHOLHDL, VLDL, LDLCALC, LDLDIRECT   Wt Readings from Last 3 Encounters:   07/25/19 145 lb 3.2 oz (65.9 kg)  07/14/19 130 lb (59 kg)  07/05/19 140 lb 9.6 oz (63.8 kg)      Other studies Reviewed: Additional studies/ records that were reviewed today include: TTE 11/18/18  Review of the above records today demonstrates:  - Left ventricle: The cavity size was normal. Wall thickness was   increased in a pattern of mild LVH. Systolic function was normal.   The estimated ejection fraction was in the range of 60% to 65%.   Wall motion was normal; there were no regional wall motion   abnormalities. The study is not technically sufficient to allow   evaluation of LV diastolic function. - Mitral valve: Mildly thickened leaflets . There was trivial   regurgitation. - Left atrium: Moderately dilated. - Right ventricle: The cavity size was mildly dilated. - Right atrium: Moderately dilated. - Tricuspid valve: There was moderate regurgitation. - Pulmonary arteries: PA peak pressure: 51 mm Hg (S). - Inferior vena cava: The vessel was dilated. The respirophasic   diameter changes were blunted (< 50%), consistent with elevated   central venous pressure.  Holter 03/26/18 personally reviewed Sinus Bradycardia / Sinus Rhythm / Sinus Arrhythmia/ Sinus Tachycardia, with intermittent First Degree AV Block, ? Junctional Rhythm and 2 pauses over 2 seconds - this appears to be 2nd degree type II AV block.  PVC's, and PAC's noted. Ventricular Couplets, Trigeminy. Atrial Pairs, Runs, Trigeminy. Diary entries correlate with PVC's, and PAC's. No atrial flutter was noted.  ASSESSMENT AND PLAN:  1.  Typical atrial fibrillation/flutter: Currently on Eliquis, diltiazem, metoprolol.  Most of her rhythm has been atrial flutter, though she does endorse atrial fibrillation previously.  I do think that she would best be treated with medications.  I spoke with her about options including amiodarone, dofetilide, flecainide.  At this point, she would prefer to avoid hospitalization.  She has a normal  ejection fraction as well as a stress test which has been read as low risk.  We Holly Benson thus plan for flecainide.  She Holly Benson come to clinic in 1 week for an ECG and if she remains out of rhythm, Peterson Mathey plan for cardioversion.  This patients CHA2DS2-VASc Score and unadjusted Ischemic Stroke Rate (% per year) is equal to 4.8 % stroke rate/year from a score of 4  Above score calculated as 1 point each if present [CHF, HTN, DM, Vascular=MI/PAD/Aortic Plaque, Age if 65-74, or Female] Above score calculated as 2 points each if present [Age > 75, or Stroke/TIA/TE]   2. OSA: Plan for sleep study   Case discussed with referring cardiologist   Current medicines are reviewed at length with the patient today.   The patient does not have concerns regarding her medicines.  The following changes were made today:  none  Labs/ tests ordered today include:  No orders of the defined types were placed in this encounter.    Disposition:   FU with Kailan Laws  Cohen Boettner 3 months  Signed, Fredrik Mogel Meredith Leeds, MD  07/25/2019 3:34 PM     [facility] [address] [place] [address] [phone] (office) [phone] (fax)

## 2019-07-26 ENCOUNTER — Telehealth: Payer: Self-pay | Admitting: *Deleted

## 2019-07-26 NOTE — Telephone Encounter (Signed)
ASV order faxed to [facility].

## 2019-07-28 DIAGNOSIS — M545 Low back pain: Secondary | ICD-10-CM | POA: Diagnosis not present

## 2019-07-28 DIAGNOSIS — M542 Cervicalgia: Secondary | ICD-10-CM | POA: Diagnosis not present

## 2019-08-01 DIAGNOSIS — M545 Low back pain: Secondary | ICD-10-CM | POA: Diagnosis not present

## 2019-08-01 DIAGNOSIS — M542 Cervicalgia: Secondary | ICD-10-CM | POA: Diagnosis not present

## 2019-08-02 ENCOUNTER — Other Ambulatory Visit: Payer: Self-pay

## 2019-08-02 ENCOUNTER — Other Ambulatory Visit (INDEPENDENT_AMBULATORY_CARE_PROVIDER_SITE_OTHER): Payer: Medicare Other | Admitting: *Deleted

## 2019-08-02 ENCOUNTER — Ambulatory Visit (INDEPENDENT_AMBULATORY_CARE_PROVIDER_SITE_OTHER)
Admission: RE | Admit: 2019-08-02 | Discharge: 2019-08-02 | Disposition: A | Discharge status: Home / Self Care | Payer: Medicare Other | Source: Ambulatory Visit | Attending: Gastroenterology | Admitting: Gastroenterology

## 2019-08-02 ENCOUNTER — Encounter: Payer: Self-pay | Admitting: Gastroenterology

## 2019-08-02 ENCOUNTER — Ambulatory Visit (INDEPENDENT_AMBULATORY_CARE_PROVIDER_SITE_OTHER): Payer: Medicare Other | Admitting: Gastroenterology

## 2019-08-02 VITALS — BP 108/56 | HR 66 | Ht 62.5 in | Wt 145.0 lb

## 2019-08-02 VITALS — BP 134/72 | HR 69 | Temp 96.7°F | Ht 62.5 in | Wt 145.4 lb

## 2019-08-02 DIAGNOSIS — K5900 Constipation, unspecified: Secondary | ICD-10-CM | POA: Diagnosis not present

## 2019-08-02 DIAGNOSIS — R12 Heartburn: Secondary | ICD-10-CM | POA: Diagnosis not present

## 2019-08-02 DIAGNOSIS — K439 Ventral hernia without obstruction or gangrene: Secondary | ICD-10-CM

## 2019-08-02 DIAGNOSIS — K5909 Other constipation: Secondary | ICD-10-CM

## 2019-08-02 DIAGNOSIS — R1013 Epigastric pain: Secondary | ICD-10-CM | POA: Diagnosis not present

## 2019-08-02 DIAGNOSIS — K5904 Chronic idiopathic constipation: Secondary | ICD-10-CM | POA: Diagnosis not present

## 2019-08-02 DIAGNOSIS — I480 Paroxysmal atrial fibrillation: Secondary | ICD-10-CM | POA: Diagnosis not present

## 2019-08-02 NOTE — Patient Instructions (Addendum)
Your provider has requested that you have an abdominal x ray before leaving today. Please go to the basement floor to our Radiology department for the test.  We have given you samples of Linzess 72 mcg  Today  Dr Silverio Decamp recommends that you complete a bowel purge (to clean out your bowels). Please do the following: Purchase a bottle of Miralax over the counter as well as a box of 5 mg dulcolax tablets. Take 4 dulcolax tablets. Wait 1 hour. You will then drink 6-8 capfuls of Miralax mixed in an adequate amount of water/juice/gatorade (you may choose which of these liquids to drink) over the next 2-3 hours. You should expect results within 1 to 6 hours after completing the bowel purge.  Take Benefiber 1 tablespoon three times a day with meals  Increase water intake to 8 cups daily  Follow up in 2 months  If you are age 83 or older, your body mass index should be between 23-30. Your Body mass index is 26.17 kg/m. If this is out of the aforementioned range listed, please consider follow up with your Primary Care Provider.  If you are age 27 or younger, your body mass index should be between 19-25. Your Body mass index is 26.17 kg/m. If this is out of the aformentioned range listed, please consider follow up with your Primary Care Provider.    I appreciate the  opportunity to care for you  Thank You   Harl Bowie , MD

## 2019-08-02 NOTE — Patient Instructions (Signed)
PT HERE TODAY FOR EKG DUE TO POSSIBLE NEED FOR CARDIOVERSION. EKG DONE AND REVIEWED BY DR. Curt Bears WHO STATES EKG NORMAL SINUS RHYTHM, PT DOES NOT NEED CARDIOVERSION PER DR. CAMNITZ.   PER DR. Curt Bears STOP FLECAINIDE DUE TO INCREASE SHORTNESS OF BREATH. CALL THE OFFICE IN THE NEXT WEEK TO LET DR. Curt Bears AND HIS NURSE SHERRI KNOW IF FEELING BETTER OFF THE FLECAINIDE.    PT ADVISED TO PLEASE CALL THE OFFICE [phone] IN THE NEXT WEEK WHETHER FEELING BETTER OR NOT.   NO LABS WERE ORDERED TODAY  ONLY MEDICATION CHANGE TO IS TO STOP FLECAINIDE.

## 2019-08-02 NOTE — Progress Notes (Unsigned)
PT HERE TODAY FOR EKG DUE TO POSSIBLE NEED FOR CARDIOVERSION. EKG DONE AND REVIEWED BY DR. Curt Bears WHO STATES EKG NORMAL SINUS RHYTHM, PT DOES NOT NEED CARDIOVERSION PER DR. CAMNITZ.   PER DR. Curt Bears STOP FLECAINIDE DUE TO INCREASE SHORTNESS OF BREATH. CALL THE OFFICE IN THE NEXT WEEK TO LET DR. Curt Bears AND HIS NURSE SHERRI KNOW IF FEELING BETTER OFF THE FLECAINIDE.    PT ADVISED TO PLEASE CALL THE OFFICE [phone] IN THE NEXT WEEK WHETHER FEELING BETTER OR NOT.   NO LABS WERE ORDERED TODAY  ONLY MEDICATION CHANGE TO IS TO STOP FLECAINIDE.  VITAL SIGNS TODAY: BP 108/56, HR 66, WT 145  PT C/O ANKLE EDEMA INCREASED DOE SINCE STARTING FLECAINIDE  SOME CHEST TIGHTNESS THOUGH SHE STATES SHE FEELS THIS IS DUE TO SOME GI DISTRESS SHE IS HAVING RIGHT NOW  PT HAS GIVE VERBAL UNDERSTANDING TO PLAN OF CARE FROM DR. CAMNITZ TO STOP FLECAINIDE AND LET THE OFFICE KNOW HOW SHE IS FEELING IN THE NEXT WEEK, WHETHER SHE IS FEELING BETTER OR NOT. PT THANKED ME FOR OUR HELP TODAY.

## 2019-08-02 NOTE — Progress Notes (Unsigned)
Visit info noted ./cy

## 2019-08-02 NOTE — Progress Notes (Signed)
Holly Benson    [id number]    1933/01/11  Primary Care Physician:Morrow, Marjory Lies, MD  Referring Physician: London Pepper, MD [facility] [address] [place],  [place] [address]   Chief complaint:  Constipation, heartburn  HPI: 31 yr with h/o paroxysmal Afib, HTN, Cardiomyopathy, COPD with complaints of worsening constipation in the past few years. She feels constipation started after she was started on multiple medications in 2016 for A. fib and hypertension, it has been progressively worse in the last 1 to 2 years. She has constant abdominal bloating, worsening discomfort in the lower abdomen and irregular bowel habits.  She also has a ventral abdominal hernia that is growing in size, denies any pain. She takes stool softeners and Miralax PRN with alternating constipation and diarrhea  Colonoscopy >10 yrs in [facility], normal per patient but does not recall where it was done or the physician's name.  Denies any nausea, vomiting, abdominal pain, melena or bright red blood per rectum No family history of colon cancer  Outpatient Encounter Medications as of 08/02/2019  Medication Sig  . albuterol (PROVENTIL HFA;VENTOLIN HFA) 108 (90 Base) MCG/ACT inhaler Inhale 2 puffs into the lungs every 4 (four) hours as needed for wheezing or shortness of breath.  . AMBULATORY NON FORMULARY MEDICATION 3-4 tablets daily. Medication Name: Colon Clear  . apixaban (ELIQUIS) 5 MG TABS tablet Take 1 tablet (5 mg total) by mouth 2 (two) times daily.  . cetirizine (ZYRTEC) 10 MG tablet Take 10 mg by mouth daily.  . Chlorpheniramine Maleate (ALLERGY PO) Take by mouth daily. Medication Name: allerchord A-20 drops daily-stated it is for allergies  . clonazePAM (KLONOPIN) 0.5 MG tablet Take 0.5 tablets by mouth as needed.  . Coenzyme Q10 (CO Q-10 PO) Take 1 tablet by mouth daily.  Marland Kitchen diltiazem (CARDIZEM CD) 300 MG 24 hr capsule Take 1 capsule (300 mg total) by mouth daily.  . flecainide  (TAMBOCOR) 50 MG tablet Take 1 tablet (50 mg total) by mouth 2 (two) times daily.  . hydrochlorothiazide (MICROZIDE) 12.5 MG capsule TAKE ONE CAPSULE BY MOUTH DAILY  . metoprolol succinate (TOPROL-XL) 25 MG 24 hr tablet Take 12.5 mg by mouth daily.   . pravastatin (PRAVACHOL) 40 MG tablet Take 40 mg by mouth daily.  Marland Kitchen QUERCETIN PO Take 500 mg by mouth daily.  Marland Kitchen STIOLTO RESPIMAT 2.5-2.5 MCG/ACT AERS USE 2 INHALATIONS ORALLY   EVERY MORNING  . VITAMIN D PO Take 1 tablet by mouth daily.   No facility-administered encounter medications on file as of 08/02/2019.     Allergies as of 08/02/2019 - Review Complete 08/02/2019  Allergen Reaction Noted  . Lovenox [enoxaparin sodium] Hives 09/10/2017  . Amlodipine  07/21/2017  . Metoprolol  12/26/2016    Past Medical History:  Diagnosis Date  . Arrhythmia   . Arthritis   . Atrial fibrillation ([place])   . COPD (chronic obstructive pulmonary disease) ([place])   . Hyperlipidemia   . Hypertension   . Sleep apnea     Past Surgical History:  Procedure Laterality Date  . ABDOMINAL HYSTERECTOMY    . arm surgery Right    Broken arm and has a plate in it  . CATARACT EXTRACTION Bilateral   . COLONOSCOPY     More than 10 years [facility]  . KNEE ARTHROSCOPY Right     Family History  Problem Relation Age of Onset  . Suicidality Father   .  Stroke Maternal Grandfather   . Colon cancer Neg Hx   . Esophageal cancer Neg Hx     Social History   Socioeconomic History  . Marital status: Widowed    Spouse name: Not on file  . Number of children: Not on file  . Years of education: Not on file  . Highest education level: Not on file  Occupational History  . Not on file  Social Needs  . Financial resource strain: Not on file  . Food insecurity    Worry: Not on file    Inability: Not on file  . Transportation needs    Medical: Not on file    Non-medical: Not on file  Tobacco Use  . Smoking status: Former Smoker    Packs/day: 1.50     Years: 30.00    Pack years: 45.00    Quit date: 11/04/1991    Years since quitting: 27.7  . Smokeless tobacco: Never Used  Substance and Sexual Activity  . Alcohol use: Yes  . Drug use: No  . Sexual activity: Not on file  Lifestyle  . Physical activity    Days per week: Not on file    Minutes per session: Not on file  . Stress: Not on file  Relationships  . Social Herbalist on phone: Not on file    Gets together: Not on file    Attends religious service: Not on file    Active member of club or organization: Not on file    Attends meetings of clubs or organizations: Not on file    Relationship status: Not on file  . Intimate partner violence    Fear of current or ex partner: Not on file    Emotionally abused: Not on file    Physically abused: Not on file    Forced sexual activity: Not on file  Other Topics Concern  . Not on file  Social History Narrative  . Not on file      Review of systems: Review of Systems  Constitutional: Negative for fever and chills.  HENT: Negative.   Eyes: Negative for blurred vision.  Respiratory: Negative for cough, shortness of breath and wheezing.   Cardiovascular: Negative for chest pain and palpitations.  Gastrointestinal: as per HPI Genitourinary: Negative for dysuria, urgency, frequency and hematuria.  Musculoskeletal: Negative for myalgias, back pain and joint pain.  Skin: Negative for itching and rash.  Neurological: Negative for dizziness, tremors, focal weakness, seizures and loss of consciousness.  Endo/Heme/Allergies: Positive for seasonal allergies.  Psychiatric/Behavioral: Negative for depression, suicidal ideas and hallucinations.  All other systems reviewed and are negative.   Physical Exam: Vitals:   08/02/19 0846  BP: 134/72  Pulse: 69  Temp: (!) 96.7 F (35.9 C)   Body mass index is 26.17 kg/m. Gen:      No acute distress HEENT:  EOMI, sclera anicteric Neck:     No masses; no thyromegaly Lungs:     Clear to auscultation bilaterally; normal respiratory effort CV:         Regular rate and rhythm; no murmurs Abd:      + bowel sounds; soft, non-tender; no palpable masses, large ventral hernia, appears to have bowel, is not reducible, mild distension Ext:    No edema; adequate peripheral perfusion Skin:      Warm and dry; no rash Neuro: alert and oriented x 3 Psych: normal mood and affect  Data Reviewed:  Reviewed labs, radiology imaging, old records  and pertinent past GI work up   Assessment and Plan/Recommendations: 85 year old female with history of A. fib on chronic anticoagulation, COPD, hypertension with worsening constipation We will obtain abdominal x-ray to exclude bowel obstruction and also to determine if she has increased stool burden If evidence of increased stool burden, bowel purge with MiraLAX Start Linzess 72 mcg daily Benefiber 1 tablespoon 2-3 times daily with meals Increase water intake to 8 cups daily If continues to have persistent constipation and difficulty evacuating, will consider anorectal manometry for further evaluation and pelvic floor physical therapy for possible pelvic floor dysfunction  Large ventral hernia with no evidence of obstruction, will continue to monitor Follow-up abdominal x-ray Advised patient to use abdominal wall binder to support during vigorous activity Discussed the signs of possible obstruction and to call if she develops any  Dyspepsia symptoms could be secondary to worsening severe constipation Will hold off starting PPI, if dyspepsia persistent despite improvement of constipation we will reconsider Discussed antireflux measures  Return in 2 months or sooner if needed   Greater than 50% of the time used for counseling as well as treatment plan and follow-up. She had multiple questions which were answered to her satisfaction  K. Denzil Magnuson , MD    CC: London Pepper, MD

## 2019-08-04 DIAGNOSIS — M545 Low back pain: Secondary | ICD-10-CM | POA: Diagnosis not present

## 2019-08-04 DIAGNOSIS — M542 Cervicalgia: Secondary | ICD-10-CM | POA: Diagnosis not present

## 2019-08-08 DIAGNOSIS — M545 Low back pain: Secondary | ICD-10-CM | POA: Diagnosis not present

## 2019-08-08 DIAGNOSIS — M542 Cervicalgia: Secondary | ICD-10-CM | POA: Diagnosis not present

## 2019-08-12 ENCOUNTER — Telehealth: Payer: Self-pay | Admitting: Cardiology

## 2019-08-12 ENCOUNTER — Telehealth: Payer: Self-pay | Admitting: Gastroenterology

## 2019-08-12 ENCOUNTER — Other Ambulatory Visit: Payer: Self-pay

## 2019-08-12 NOTE — Telephone Encounter (Signed)
Please advise her to start taking 2 capsules of the 72 mcg Linzess daily if she already filled a prescription and send new prescription for Linzess 145 mcg daily.  Thank you

## 2019-08-12 NOTE — Telephone Encounter (Signed)
Spoke with the patient who is calling to give follow up on the Linzess 72 mcg.  She purged with Miralax as directed. Reports good bowel movements. Began the Linzess 72 mcg the day following the purge. She had increasingly smaller bowel movements each day. She now has had no bowel movement for 3 days. Today makes day 3. She would like to try a stronger strength of Linzess. May I give her samples to try first?

## 2019-08-12 NOTE — Telephone Encounter (Signed)
The patient called to update [place] on her symptoms since stopping Flecainide on 9/29.  Her SOB has been much better and she very rarely feels any Afib or fluttering.  If she feels the flutter, it is gone within minutes.  Just wanted to call as requested to give update.  She feels much better off of the Flecainide.

## 2019-08-12 NOTE — Telephone Encounter (Signed)
Patient is advised.  

## 2019-08-12 NOTE — Telephone Encounter (Signed)
° ° ° °  Pt c/o medication issue:  1. Name of Medication: flecainide (TAMBOCOR) 50 MG tablet  2. How are you currently taking this medication (dosage and times per day)? As written  3. Are you having a reaction (difficulty breathing--STAT)? no  4. What is your medication issue? Patient calling to report how she is feeling since dosage change. Please call

## 2019-08-15 DIAGNOSIS — M545 Low back pain: Secondary | ICD-10-CM | POA: Diagnosis not present

## 2019-08-15 DIAGNOSIS — M542 Cervicalgia: Secondary | ICD-10-CM | POA: Diagnosis not present

## 2019-08-22 DIAGNOSIS — M545 Low back pain: Secondary | ICD-10-CM | POA: Diagnosis not present

## 2019-08-22 NOTE — Progress Notes (Signed)
PCP:  London Pepper, MD Primary Cardiologist: Pixie Casino, MD Electrophysiologist: Constance Haw, MD   Holly Benson is a 85 y.o. female with past medical history of atrial flutter, HTN, HLD, OSA on CPAP and COPD who presents today for routine electrophysiology followup. They are seen for Dr. Curt Bears.     She was seen by Dr. Curt Bears 9/21 and started on flecainide. She converted to NSR, but was having increased SOB which significantly improved OFF of flecainide.    She is feeling OK overall.  She continues to have palpitations almost daily, mostly in the mornings. They usually only last a few minutes and are not distressing. She continues to have SOB with climbing stairs and walking up inclines. No orthopnea, PND, or bendopnea. No CP, lightheadedness, near syncope, or syncope.   The patient feels that she is tolerating medications without difficulties and is otherwise without complaint today.   Past Medical History:  Diagnosis Date  . Arrhythmia   . Arthritis   . Atrial fibrillation ([place])   . COPD (chronic obstructive pulmonary disease) ([place])   . Hyperlipidemia   . Hypertension   . Sleep apnea    Past Surgical History:  Procedure Laterality Date  . ABDOMINAL HYSTERECTOMY    . arm surgery Right    Broken arm and has a plate in it  . CATARACT EXTRACTION Bilateral   . COLONOSCOPY     More than 10 years ago In [facility]  . KNEE ARTHROSCOPY Right     Current Outpatient Medications  Medication Sig Dispense Refill  . albuterol (PROVENTIL HFA;VENTOLIN HFA) 108 (90 Base) MCG/ACT inhaler Inhale 2 puffs into the lungs every 4 (four) hours as needed for wheezing or shortness of breath. 1 Inhaler 3  . AMBULATORY NON FORMULARY MEDICATION 3-4 tablets daily. Medication Name: Colon Clear    . apixaban (ELIQUIS) 5 MG TABS tablet Take 1 tablet (5 mg total) by mouth 2 (two) times daily. 180 tablet 2  . cetirizine (ZYRTEC) 10 MG tablet Take 10 mg by mouth daily.    .  Chlorpheniramine Maleate (ALLERGY PO) Take by mouth daily. Medication Name: allerchord A-20 drops daily-stated it is for allergies    . clonazePAM (KLONOPIN) 0.5 MG tablet Take 0.5 tablets by mouth as needed.    . Coenzyme Q10 (CO Q-10 PO) Take 1 tablet by mouth daily.    Marland Kitchen diltiazem (CARDIZEM CD) 300 MG 24 hr capsule Take 1 capsule (300 mg total) by mouth daily. 90 capsule 3  . hydrochlorothiazide (MICROZIDE) 12.5 MG capsule TAKE ONE CAPSULE BY MOUTH DAILY 90 capsule 3  . metoprolol succinate (TOPROL-XL) 25 MG 24 hr tablet Take 12.5 mg by mouth daily.     . pravastatin (PRAVACHOL) 40 MG tablet Take 40 mg by mouth daily.    Marland Kitchen QUERCETIN PO Take 500 mg by mouth daily.    Marland Kitchen STIOLTO RESPIMAT 2.5-2.5 MCG/ACT AERS USE 2 INHALATIONS ORALLY   EVERY MORNING 12 g 2  . VITAMIN D PO Take 1 tablet by mouth daily.     No current facility-administered medications for this visit.     Allergies  Allergen Reactions  . Lovenox [Enoxaparin Sodium] Hives  . Lovenox [Enoxaparin Sodium] Rash    PT STATES SHE BROKE OUT IN A RASH HEAD TO TOE AND LASTED ABOUT 3 WEEKS   . Amlodipine     Muscle pain  . Metoprolol     Muscle aches    Social History   Socioeconomic History  .  Marital status: Widowed    Spouse name: Not on file  . Number of children: Not on file  . Years of education: Not on file  . Highest education level: Not on file  Occupational History  . Not on file  Social Needs  . Financial resource strain: Not on file  . Food insecurity    Worry: Not on file    Inability: Not on file  . Transportation needs    Medical: Not on file    Non-medical: Not on file  Tobacco Use  . Smoking status: Former Smoker    Packs/day: 1.50    Years: 30.00    Pack years: 45.00    Quit date: 11/04/1991    Years since quitting: 27.8  . Smokeless tobacco: Never Used  Substance and Sexual Activity  . Alcohol use: Yes  . Drug use: No  . Sexual activity: Not on file  Lifestyle  . Physical activity    Days  per week: Not on file    Minutes per session: Not on file  . Stress: Not on file  Relationships  . Social Herbalist on phone: Not on file    Gets together: Not on file    Attends religious service: Not on file    Active member of club or organization: Not on file    Attends meetings of clubs or organizations: Not on file    Relationship status: Not on file  . Intimate partner violence    Fear of current or ex partner: Not on file    Emotionally abused: Not on file    Physically abused: Not on file    Forced sexual activity: Not on file  Other Topics Concern  . Not on file  Social History Narrative  . Not on file   Review of Systems: General: No chills, fever, night sweats or weight changes  Cardiovascular:  No chest pain, dyspnea on exertion, edema, orthopnea, palpitations, paroxysmal nocturnal dyspnea Dermatological: No rash, lesions or masses Respiratory: No cough, dyspnea Urologic: No hematuria, dysuria Abdominal: No nausea, vomiting, diarrhea, bright red blood per rectum, melena, or hematemesis Neurologic: No visual changes, weakness, changes in mental status All other systems reviewed and are otherwise negative except as noted above.  Physical Exam: Vitals:   08/24/19 0922  BP: 110/66  Pulse: 60  Weight: 144 lb (65.3 kg)  Height: 5' (1.524 m)   GEN- The patient is well appearing, alert and oriented x 3 today.   HEENT: normocephalic, atraumatic; sclera clear, conjunctiva pink; hearing intact; oropharynx clear; neck supple, no JVP Lymph- no cervical lymphadenopathy Lungs- Clear to ausculation bilaterally, normal work of breathing.  No wheezes, rales, rhonchi Heart- Regular rate and rhythm, no murmurs, rubs or gallops, PMI not laterally displaced GI- soft, non-tender, non-distended, bowel sounds present, no hepatosplenomegaly Extremities- no clubbing, cyanosis, or edema; DP/PT/radial pulses 2+ bilaterally MS- no significant deformity or atrophy Skin- warm  and dry, no rash or lesion Psych- euthymic mood, full affect Neuro- strength and sensation are intact  EKG is ordered. Personal review of EKG from today shows NSR at 60 bpm   Assessment and Plan:  1. Typical atrial flutter/ atrial fibrilation Continue Eliquis for CHA2DS2VASC of 4. Continue diltiazem. Continue Metoprolol. She would prefer not to go up on this for the time being. Could consider switch to Bisoprol if SOB persists despite CPAP and initiation of amio.  Did not tolerate flecainide.  She is in NSR today. She would like to  try amiodarone. Will start on low dose at 200 mg once daily and keep follow up with Dr. Curt Bears in December.   2. OSA She started CPAP last week. Hopefully this will improve her SOB  3. Mod pulmonary HTN NYHA II-III symptoms confounded by above.  Echo 11/2017 with LVEF 60-65% and PA peak pressure 51 mm Hg. (Moderate) Suspect class III PAH in the setting of OSA. If her SOB does not improve with management of her paroxysmal atrial arrhythmias and starting CPAP, Could consider referral to AHF team for [facility] to see if mixed component of PAH that would benefit from medical therapy  [name], PA-C  08/24/19 9:54 AM

## 2019-08-24 ENCOUNTER — Other Ambulatory Visit: Payer: Self-pay

## 2019-08-24 ENCOUNTER — Ambulatory Visit (INDEPENDENT_AMBULATORY_CARE_PROVIDER_SITE_OTHER): Payer: Medicare Other | Admitting: Student

## 2019-08-24 VITALS — BP 110/66 | HR 60 | Ht 60.0 in | Wt 144.0 lb

## 2019-08-24 DIAGNOSIS — I480 Paroxysmal atrial fibrillation: Secondary | ICD-10-CM | POA: Diagnosis not present

## 2019-08-24 DIAGNOSIS — G4733 Obstructive sleep apnea (adult) (pediatric): Secondary | ICD-10-CM | POA: Diagnosis not present

## 2019-08-24 DIAGNOSIS — I10 Essential (primary) hypertension: Secondary | ICD-10-CM

## 2019-08-24 DIAGNOSIS — I429 Cardiomyopathy, unspecified: Secondary | ICD-10-CM | POA: Diagnosis not present

## 2019-08-24 LAB — COMPREHENSIVE METABOLIC PANEL
ALT: 18 IU/L (ref 0–32)
AST: 21 IU/L (ref 0–40)
Albumin/Globulin Ratio: 1.9 (ref 1.2–2.2)
Albumin: 4.5 g/dL (ref 3.6–4.6)
Alkaline Phosphatase: 96 IU/L (ref 39–117)
BUN/Creatinine Ratio: 22 (ref 12–28)
BUN: 20 mg/dL (ref 8–27)
Bilirubin Total: 0.5 mg/dL (ref 0.0–1.2)
CO2: 23 mmol/L (ref 20–29)
Calcium: 10 mg/dL (ref 8.7–10.3)
Chloride: 96 mmol/L (ref 96–106)
Creatinine, Ser: 0.89 mg/dL (ref 0.57–1.00)
GFR calc Af Amer: 69 mL/min/{1.73_m2} (ref >59)
GFR calc non Af Amer: 60 mL/min/{1.73_m2} (ref >59)
Globulin, Total: 2.4 g/dL (ref 1.5–4.5)
Glucose: 95 mg/dL (ref 65–99)
Potassium: 4 mmol/L (ref 3.5–5.2)
Sodium: 136 mmol/L (ref 134–144)
Total Protein: 6.9 g/dL (ref 6.0–8.5)

## 2019-08-24 LAB — TSH: TSH: 1.86 u[IU]/mL (ref 0.450–4.500)

## 2019-08-24 MED ORDER — AMIODARONE HCL 200 MG PO TABS: 200.0000 mg | ORAL_TABLET | Freq: Every day | ORAL | 2 refills | Status: DC

## 2019-08-24 MED ORDER — AMIODARONE HCL 200 MG PO TABS: 200.0000 mg | ORAL_TABLET | Freq: Every day | ORAL | 3 refills | Status: DC

## 2019-08-24 NOTE — Patient Instructions (Addendum)
Medication Instructions:  STAR TAKING AMIODARONE 200 MG ONCE A DAY   *If you need a refill on your cardiac medications before your next appointment, please call your pharmacy*  Lab Work:   TSH AND  CMET TODAY   If you have labs (blood work) drawn today and your tests are completely normal, you will receive your results only by: Marland Kitchen MyChart Message (if you have MyChart) OR . A paper copy in the mail If you have any lab test that is abnormal or we need to change your treatment, we will call you to review the results.  Testing/Procedures: NONE ORDERED  TODAY'    Follow-Up: At [facility], you and your health needs are our priority.  As part of our continuing mission to provide you with exceptional heart care, we have created designated Provider Care Teams.  These Care Teams include your primary Cardiologist (physician) and Advanced Practice Providers (APPs -  Physician Assistants and Nurse Practitioners) who all work together to provide you with the care you need, when you need it.  AS SCHEDULED   Other Instructions

## 2019-08-25 ENCOUNTER — Ambulatory Visit: Payer: Medicare Other | Admitting: Internal Medicine

## 2019-08-25 ENCOUNTER — Telehealth: Payer: Self-pay | Admitting: Student

## 2019-08-25 ENCOUNTER — Telehealth: Payer: Self-pay | Admitting: Gastroenterology

## 2019-08-25 NOTE — Telephone Encounter (Signed)
Left message for pt to return my call to get more information

## 2019-08-25 NOTE — Telephone Encounter (Signed)
Could you please ask her what her specific concerns are?  We discussed the side effects of amiodarone at length and how we monitor for them at our visit.    The only other medication option for her is Tikosyn, which involves a three day hospitalization, and would require [place] to stop at least one of her other medications (HCTZ). She would also need to check with her insurance how much that medication would be for her, as the pricing varies depending on coverage.   If she still wishes to proceed with Tikosyn consideration, I would ask that an appointment be made for her in the afib clinic, as she will have a lot of questions about the preparation for initiation and chronic monitoring of Tikosyn, and I think this would be best done in person; and I am in the hospital for the next couple of weeks.    Thank you.   Legrand Como [address] [place], [place]  08/25/2019 3:33 PM

## 2019-08-25 NOTE — Telephone Encounter (Signed)
I spoke with pt. Her concern is she read Amiodarone interacts with Cardizem.   She does not want to try Tikosyn. She thinks increasing metoprolol was a possible option and requests I ask about this.

## 2019-08-25 NOTE — Telephone Encounter (Signed)
linzess 145 mcg not working wants to know if she can take 290 mcg or 2 of the 145 Linzess  BM's are still hard and she is not emptying all the way

## 2019-08-25 NOTE — Telephone Encounter (Signed)
Follow Up:    Pt saw Holly Benson yesterday and started her on a new medicine(Amiodarone). She said after talking to the pharmacist at the pharnmacy, she have decided that she does not want to take the Amiodarone. She would like to try one of the other options he discussed with her yesterday.

## 2019-08-26 MED ORDER — LINACLOTIDE 290 MCG PO CAPS: 290.0000 ug | ORAL_CAPSULE | Freq: Every day | ORAL | 3 refills | Status: DC

## 2019-08-26 NOTE — Telephone Encounter (Signed)
Patient stated her insurance will not cover 290 linzess but would like to try samples first before we do a prior auth for Linzess  Pt will pick up on Monday

## 2019-08-26 NOTE — Telephone Encounter (Signed)
Spoke with patient on recommendations and patient agreed to take a whole tablet of Toprol 25 mg than half.Patient stated she will contact office back if she has nay more problems

## 2019-08-26 NOTE — Telephone Encounter (Signed)
Okay to increase dose of Linzess to 290 mcg daily.  Please send new prescription.  Thanks

## 2019-08-26 NOTE — Telephone Encounter (Signed)
Linzess 290 ok to take  Sent in new rx to [facility]

## 2019-08-26 NOTE — Telephone Encounter (Signed)
Amiodarone can enhance the effects of cardizem. We would monitor closely.   OK to increase Toprol to 25 mg daily if this is what she would like to try first.   Legrand Como "[name], PA-C  08/26/2019 9:23 AM

## 2019-08-27 ENCOUNTER — Other Ambulatory Visit: Payer: Self-pay | Admitting: Internal Medicine

## 2019-09-02 DIAGNOSIS — M545 Low back pain: Secondary | ICD-10-CM | POA: Diagnosis not present

## 2019-09-05 ENCOUNTER — Telehealth: Payer: Self-pay | Admitting: *Deleted

## 2019-09-05 NOTE — Telephone Encounter (Signed)
Patient having problem with her cpap pressure blowing too hard constantly without fluctuation.

## 2019-09-07 NOTE — Telephone Encounter (Signed)
Reviewed rationale w/ pt. Pt appreciative of my time to explain what the interaction is. She will start the medication tonight.

## 2019-09-08 ENCOUNTER — Telehealth (INDEPENDENT_AMBULATORY_CARE_PROVIDER_SITE_OTHER): Payer: Medicare Other | Admitting: Internal Medicine

## 2019-09-08 ENCOUNTER — Encounter: Payer: Self-pay | Admitting: Internal Medicine

## 2019-09-08 VITALS — BP 122/62 | HR 61 | Ht 63.0 in | Wt 140.0 lb

## 2019-09-08 DIAGNOSIS — I429 Cardiomyopathy, unspecified: Secondary | ICD-10-CM

## 2019-09-08 DIAGNOSIS — R0600 Dyspnea, unspecified: Secondary | ICD-10-CM

## 2019-09-08 DIAGNOSIS — G4733 Obstructive sleep apnea (adult) (pediatric): Secondary | ICD-10-CM | POA: Diagnosis not present

## 2019-09-08 DIAGNOSIS — J438 Other emphysema: Secondary | ICD-10-CM

## 2019-09-08 DIAGNOSIS — R0609 Other forms of dyspnea: Secondary | ICD-10-CM

## 2019-09-08 DIAGNOSIS — I480 Paroxysmal atrial fibrillation: Secondary | ICD-10-CM | POA: Diagnosis not present

## 2019-09-08 NOTE — Patient Instructions (Signed)
Medication Instructions:  No changes *If you need a refill on your cardiac medications before your next appointment, please call your pharmacy*  Lab Work: None ordered If you have labs (blood work) drawn today and your tests are completely normal, you will receive your results only by: Marland Kitchen MyChart Message (if you have MyChart) OR . A paper copy in the mail If you have any lab test that is abnormal or we need to change your treatment, we will call you to review the results.  Testing/Procedures: None ordered  Follow-Up: At [facility], you and your health needs are our priority.  As part of our continuing mission to provide you with exceptional heart care, we have created designated Provider Care Teams.  These Care Teams include your primary Cardiologist (physician) and Advanced Practice Providers (APPs -  Physician Assistants and Nurse Practitioners) who all work together to provide you with the care you need, when you need it.  Your next appointment:   Follow up in one month  The format for your next appointment:   In Person  Provider:   K. Mali Hilty, MD

## 2019-09-08 NOTE — Progress Notes (Signed)
Virtual Visit via Telephone Note   This visit type was conducted due to national recommendations for restrictions regarding the COVID-19 Pandemic (e.g. social distancing) in an effort to limit this patient's exposure and mitigate transmission in our community.  Due to her co-morbid illnesses, this patient is at least at moderate risk for complications without adequate follow up.  This format is felt to be most appropriate for this patient at this time.  The patient did not have access to video technology/had technical difficulties with video requiring transitioning to audio format only (telephone).  All issues noted in this document were discussed and addressed.  No physical exam could be performed with this format.  Please refer to the patient's chart for her  consent to telehealth for [facility].   Evaluation Performed:  Telephone follow-up  Date:  09/08/2019   ID:  Holly Benson, DOB 07/02/1933, MRN [id number]  Patient Location:  [place] [address]  Provider location:   [address], [place] [address] [place], [place] [address]  PCP:  London Pepper, MD  Cardiologist:  Pixie Casino, MD Electrophysiologist:  [name], MD   Chief Complaint:  Shortness of breath  History of Present Illness:    Holly Benson is a 85 y.o. female who presents via audio/video conferencing for a telehealth visit today.  Holly Benson was seen today for follow-up.  She continues to have shortness of breath and fatigue.  She was diagnosed with sleep apnea but has not been compliant with her CPAP due to problems with machine.  She says it is "blasting her in the face" when it starts up, suggesting that the ramp feature may be disabled.  I have reached out to our sleep center to assist her with this.  She is also been struggling with recurrent atrial fibrillation.  She saw one of our nurse practitioners who referred her to EP after discussing with me.  She then saw Dr. Curt Bears who  recommended flecainide however she had some side effects with that and it was discontinued.  Most recently she saw [name], PA-C who recommended switching her over to amiodarone.  She just started that yesterday.  She was in sinus rhythm at the time when he saw her.  He noted she had a history of some pulmonary hypertension and thought that maybe her shortness of breath was worsened because of lack of use of her CPAP.  I suspect her pulmonary hypertension etiology is mixed due to COPD/possible pulmonary fibrosis, and history of obstructive sleep apnea.  The patient does not have symptoms concerning for COVID-19 infection (fever, chills, cough, or new SHORTNESS OF BREATH).    Prior CV studies:   The following studies were reviewed today:  Chart reviewed  PMHx:  Past Medical History:  Diagnosis Date  . Arrhythmia   . Arthritis   . Atrial fibrillation ([place])   . COPD (chronic obstructive pulmonary disease) ([place])   . Hyperlipidemia   . Hypertension   . Sleep apnea     Past Surgical History:  Procedure Laterality Date  . ABDOMINAL HYSTERECTOMY    . arm surgery Right    Broken arm and has a plate in it  . CATARACT EXTRACTION Bilateral   . COLONOSCOPY     More than 10 years ago In [facility]  . KNEE ARTHROSCOPY Right     FAMHx:  Family History  Problem Relation Age of Onset  . Suicidality Father   . Stroke Maternal Grandfather   .  Colon cancer Neg Hx   . Esophageal cancer Neg Hx     SOCHx:   reports that she quit smoking about 27 years ago. She has a 45.00 pack-year smoking history. She has never used smokeless tobacco. She reports current alcohol use. She reports that she does not use drugs.  ALLERGIES:  Allergies  Allergen Reactions  . Lovenox [Enoxaparin Sodium] Hives  . Lovenox [Enoxaparin Sodium] Rash    PT STATES SHE BROKE OUT IN A RASH HEAD TO TOE AND LASTED ABOUT 3 WEEKS   . Amlodipine     Muscle pain  . Metoprolol     Muscle aches    MEDS:  Current  Meds  Medication Sig  . albuterol (PROVENTIL HFA;VENTOLIN HFA) 108 (90 Base) MCG/ACT inhaler Inhale 2 puffs into the lungs every 4 (four) hours as needed for wheezing or shortness of breath.  Marland Kitchen amiodarone (PACERONE) 200 MG tablet Take 1 tablet (200 mg total) by mouth daily.  Marland Kitchen apixaban (ELIQUIS) 5 MG TABS tablet Take 1 tablet (5 mg total) by mouth 2 (two) times daily.  . cetirizine (ZYRTEC) 10 MG tablet Take 10 mg by mouth daily.  . clonazePAM (KLONOPIN) 0.5 MG tablet Take 0.5 tablets by mouth as needed.  . Coenzyme Q10 (CO Q-10 PO) Take 1 tablet by mouth daily.  . hydrochlorothiazide (MICROZIDE) 12.5 MG capsule TAKE ONE CAPSULE BY MOUTH DAILY  . linaclotide (LINZESS) 290 MCG CAPS capsule Take 1 capsule (290 mcg total) by mouth daily before breakfast.  . metoprolol succinate (TOPROL-XL) 25 MG 24 hr tablet Take 12.5 mg by mouth.   . pravastatin (PRAVACHOL) 40 MG tablet Take 40 mg by mouth daily.  Marland Kitchen QUERCETIN PO Take 500 mg by mouth daily.  Marland Kitchen VITAMIN D PO Take 1 tablet by mouth daily.     ROS: Pertinent items noted in HPI and remainder of comprehensive ROS otherwise negative.  Labs/Other Tests and Data Reviewed:    Recent Labs: 12/01/2018: Hemoglobin 14.2; Platelets 374 08/24/2019: ALT 18; BUN 20; Creatinine, Ser 0.89; Potassium 4.0; Sodium 136; TSH 1.860   Recent Lipid Panel No results found for: CHOL, TRIG, HDL, CHOLHDL, LDLCALC, LDLDIRECT  Wt Readings from Last 3 Encounters:  09/08/19 140 lb (63.5 kg)  08/24/19 144 lb (65.3 kg)  08/02/19 145 lb (65.8 kg)     Exam:    Vital Signs:  BP 122/62   Pulse 61   Ht 5\' 3"  (1.6 m)   Wt 140 lb (63.5 kg)   BMI 24.80 kg/m    Exam deferred due to telephone visit  ASSESSMENT & PLAN:    1. History of atrial fibrillation, status post DCCV 2. Paroxysmal atrial flutter 3. Palpitations 4. CHADSVASC score of 4 - on Eliquis 5. Hypertension  6. Dyslipidemia 7. OSA - awaiting APV titration  8. COPD-moderate pulmonary hypertension  Ms.  Benson has had recurrent atrial fibrillation but seems to be breathless and fatigued even when she is not in A. fib.  She was recently taken off of flecainide and switched over to amiodarone.  It remains to be seen while this will be tolerated.  She is on Eliquis.  She needs adjustment in her CPAP as she is not been able to use it for more than a week.  She has been fatigued and short of breath and does have some moderate pulmonary hypertension on her last echo.  I suspect this is related to combination of COPD, obstructive sleep apnea and possibly some diastolic heart failure ultimately we  may need to consider left and right heart catheterization to further evaluate her continued breathlessness and fatigue.  Plan follow-up with me in 2 months as she has a follow-up with EP in 1 month.  COVID-19 Education: The signs and symptoms of COVID-19 were discussed with the patient and how to seek care for testing (follow up with PCP or arrange E-visit).  The importance of social distancing was discussed today.  Patient Risk:   After full review of this patients clinical status, I feel that they are at least moderate risk at this time.  Time:   Today, I have spent 25 minutes with the patient with telehealth technology discussing shortness of breath, A. fib, fatigue.     Medication Adjustments/Labs and Tests Ordered: Current medicines are reviewed at length with the patient today.  Concerns regarding medicines are outlined above.   Tests Ordered: No orders of the defined types were placed in this encounter.   Medication Changes: No orders of the defined types were placed in this encounter.   Disposition:  in 2 month(s)  Pixie Casino, MD, [facility], [place] Director of the Advanced Lipid Disorders &  Cardiovascular Risk Reduction Clinic Diplomate of the American Board of Clinical Lipidology Attending Cardiologist  Direct Dial: [phone]  Fax: [phone]   Website:  [email or web address]  Pixie Casino, MD  09/08/2019 8:35 AM

## 2019-09-09 ENCOUNTER — Telehealth: Payer: Self-pay | Admitting: Gastroenterology

## 2019-09-09 NOTE — Telephone Encounter (Signed)
Spoke with the patient. She is presently constipated. Small bowel movement yesterday, but did not feel emptied. States it has been like this for several days. She is ready to begin taking the Linzess. Wants to know if she should follow the original plan to purge first.  Discussed the reason for the purge would essentially be to "reset" the bowels. She decides to start with the purge then begin taking the Linzess the following day. She reports the cost of the linzess may make it not an option for her. She will have to pay $800.00 per month with her Medicare insurance.

## 2019-09-09 NOTE — Telephone Encounter (Signed)
Pt reported that she has not taken Linzess samples due to just starting heart medication.    She inquired that if she were to start Mar-Mac now, would she need another bowel purge? Pt stated that she is very constipated.

## 2019-09-12 DIAGNOSIS — F410 Panic disorder [episodic paroxysmal anxiety] without agoraphobia: Secondary | ICD-10-CM | POA: Diagnosis not present

## 2019-09-12 DIAGNOSIS — I10 Essential (primary) hypertension: Secondary | ICD-10-CM | POA: Diagnosis not present

## 2019-09-12 DIAGNOSIS — E785 Hyperlipidemia, unspecified: Secondary | ICD-10-CM | POA: Diagnosis not present

## 2019-09-12 DIAGNOSIS — Z0000 Encounter for general adult medical examination without abnormal findings: Secondary | ICD-10-CM | POA: Diagnosis not present

## 2019-09-12 DIAGNOSIS — J449 Chronic obstructive pulmonary disease, unspecified: Secondary | ICD-10-CM | POA: Diagnosis not present

## 2019-09-12 DIAGNOSIS — Z8679 Personal history of other diseases of the circulatory system: Secondary | ICD-10-CM | POA: Diagnosis not present

## 2019-09-13 ENCOUNTER — Telehealth: Payer: Self-pay | Admitting: *Deleted

## 2019-09-13 NOTE — Telephone Encounter (Signed)
Linzess approved from [employer] number [phone]

## 2019-09-14 ENCOUNTER — Telehealth: Payer: Self-pay | Admitting: Cardiology

## 2019-09-14 NOTE — Telephone Encounter (Signed)
Discussed with patient personally.   She just started amiodarone last week and has continued to have SOB, it isn't necessarily worse than prior to starting, but she was reading the side effects of amiodarone and saw the warning to discuss with your provider if you are having SOB.   She has intermittent palpitations and SOB throughout the day, usually worse in the mornings, and at times in the evenings. The middle of the day is "usually OK".  She is typically "wiped out" by the end of the day, especially on high burden AF days.   She denies SOB at rest, but with moderate exertion, and sometimes her ADLs.   She has no further concerns or questions at this time.  My recommendation was to continue her amiodarone at the current dose for now, in hopes that this decreases her AFIB burden overall.  Pt knows to call us if she has worsening SOB or new symptoms, especially SOB at rest.  Advised to keep upcoming appointment with Dr. Curt Bears.   Legrand Como [address] [place], PA-C  09/14/2019 1:32 PM

## 2019-09-14 NOTE — Telephone Encounter (Signed)
Spoke to [place], working w/ [employer] today. She will have him review/advise

## 2019-09-14 NOTE — Telephone Encounter (Signed)
New Message     Pt c/o medication issue:  1. Name of Medication: amiodarone   2. How are you currently taking this medication (dosage and times per day)? 1 tablet daily 200 mg   3. Are you having a reaction (difficulty breathing--STAT)? SOB   4. What is your medication issue? Pt says she is having a reaction to the medication.  Increased fatigue and weakness, Afib since Sunday

## 2019-09-15 NOTE — Telephone Encounter (Signed)
Changes were made to [employer] ASV

## 2019-09-23 ENCOUNTER — Other Ambulatory Visit: Payer: Self-pay

## 2019-09-23 ENCOUNTER — Ambulatory Visit (INDEPENDENT_AMBULATORY_CARE_PROVIDER_SITE_OTHER): Payer: Medicare Other | Admitting: Gastroenterology

## 2019-09-23 ENCOUNTER — Encounter: Payer: Self-pay | Admitting: Gastroenterology

## 2019-09-23 VITALS — BP 110/60 | HR 69 | Temp 97.6°F | Ht 63.0 in | Wt 142.0 lb

## 2019-09-23 DIAGNOSIS — K5904 Chronic idiopathic constipation: Secondary | ICD-10-CM

## 2019-09-23 DIAGNOSIS — R140 Abdominal distension (gaseous): Secondary | ICD-10-CM | POA: Diagnosis not present

## 2019-09-23 DIAGNOSIS — K219 Gastro-esophageal reflux disease without esophagitis: Secondary | ICD-10-CM

## 2019-09-23 DIAGNOSIS — K439 Ventral hernia without obstruction or gangrene: Secondary | ICD-10-CM

## 2019-09-23 DIAGNOSIS — K581 Irritable bowel syndrome with constipation: Secondary | ICD-10-CM

## 2019-09-23 DIAGNOSIS — K5900 Constipation, unspecified: Secondary | ICD-10-CM

## 2019-09-23 MED ORDER — FAMOTIDINE 20 MG PO TABS: 20.0000 mg | ORAL_TABLET | Freq: Every day | ORAL | 3 refills | Status: DC

## 2019-09-23 NOTE — Patient Instructions (Signed)
If you are age 83 or older, your body mass index should be between 23-30. Your Body mass index is 25.15 kg/m. If this is out of the aforementioned range listed, please consider follow up with your Primary Care Provider.  If you are age 9 or younger, your body mass index should be between 19-25. Your Body mass index is 25.15 kg/m. If this is out of the aformentioned range listed, please consider follow up with your Primary Care Provider.    We have given you samples of Linzess 72 mcg samples to take once daily for constipation  Take Benefiber 1 tablespoon twice daily  We have sent Pepcid to your pharmacy  Follow up in 6 months   Gastroesophageal Reflux Disease, Adult Gastroesophageal reflux (GER) happens when acid from the stomach flows up into the tube that connects the mouth and the stomach (esophagus). Normally, food travels down the esophagus and stays in the stomach to be digested. However, when a person has GER, food and stomach acid sometimes move back up into the esophagus. If this becomes a more serious problem, the person may be diagnosed with a disease called gastroesophageal reflux disease (GERD). GERD occurs when the reflux:  Happens often.  Causes frequent or severe symptoms.  Causes problems such as damage to the esophagus. When stomach acid comes in contact with the esophagus, the acid may cause soreness (inflammation) in the esophagus. Over time, GERD may create small holes (ulcers) in the lining of the esophagus. What are the causes? This condition is caused by a problem with the muscle between the esophagus and the stomach (lower esophageal sphincter, or LES). Normally, the LES muscle closes after food passes through the esophagus to the stomach. When the LES is weakened or abnormal, it does not close properly, and that allows food and stomach acid to go back up into the esophagus. The LES can be weakened by certain dietary substances, medicines, and medical conditions,  including:  Tobacco use.  Pregnancy.  Having a hiatal hernia.  Alcohol use.  Certain foods and beverages, such as coffee, chocolate, onions, and peppermint. What increases the risk? You are more likely to develop this condition if you:  Have an increased body weight.  Have a connective tissue disorder.  Use NSAID medicines. What are the signs or symptoms? Symptoms of this condition include:  Heartburn.  Difficult or painful swallowing.  The feeling of having a lump in the throat.  Abitter taste in the mouth.  Bad breath.  Having a large amount of saliva.  Having an upset or bloated stomach.  Belching.  Chest pain. Different conditions can cause chest pain. Make sure you see your health care provider if you experience chest pain.  Shortness of breath or wheezing.  Ongoing (chronic) cough or a night-time cough.  Wearing away of tooth enamel.  Weight loss. How is this diagnosed? Your health care provider will take a medical history and perform a physical exam. To determine if you have mild or severe GERD, your health care provider may also monitor how you respond to treatment. You may also have tests, including:  A test to examine your stomach and esophagus with a small camera (endoscopy).  A test thatmeasures the acidity level in your esophagus.  A test thatmeasures how much pressure is on your esophagus.  A barium swallow or modified barium swallow test to show the shape, size, and functioning of your esophagus. How is this treated? The goal of treatment is to help relieve your  symptoms and to prevent complications. Treatment for this condition may vary depending on how severe your symptoms are. Your health care provider may recommend:  Changes to your diet.  Medicine.  Surgery. Follow these instructions at home: Eating and drinking   Follow a diet as recommended by your health care provider. This may involve avoiding foods and drinks such  as: ? Coffee and tea (with or without caffeine). ? Drinks that containalcohol. ? Energy drinks and sports drinks. ? Carbonated drinks or sodas. ? Chocolate and cocoa. ? Peppermint and mint flavorings. ? Garlic and onions. ? Horseradish. ? Spicy and acidic foods, including peppers, chili powder, curry powder, vinegar, hot sauces, and barbecue sauce. ? Citrus fruit juices and citrus fruits, such as oranges, lemons, and limes. ? Tomato-based foods, such as red sauce, chili, salsa, and pizza with red sauce. ? Fried and fatty foods, such as donuts, french fries, potato chips, and high-fat dressings. ? High-fat meats, such as hot dogs and fatty cuts of red and white meats, such as rib eye steak, sausage, ham, and bacon. ? High-fat dairy items, such as whole milk, butter, and cream cheese.  Eat small, frequent meals instead of large meals.  Avoid drinking large amounts of liquid with your meals.  Avoid eating meals during the 2-3 hours before bedtime.  Avoid lying down right after you eat.  Do not exercise right after you eat. Lifestyle   Do not use any products that contain nicotine or tobacco, such as cigarettes, e-cigarettes, and chewing tobacco. If you need help quitting, ask your health care provider.  Try to reduce your stress by using methods such as yoga or meditation. If you need help reducing stress, ask your health care provider.  If you are overweight, reduce your weight to an amount that is healthy for you. Ask your health care provider for guidance about a safe weight loss goal. General instructions  Pay attention to any changes in your symptoms.  Take over-the-counter and prescription medicines only as told by your health care provider. Do not take aspirin, ibuprofen, or other NSAIDs unless your health care provider told you to do so.  Wear loose-fitting clothing. Do not wear anything tight around your waist that causes pressure on your abdomen.  Raise (elevate) the  head of your bed about 6 inches (15 cm).  Avoid bending over if this makes your symptoms worse.  Keep all follow-up visits as told by your health care provider. This is important. Contact a health care provider if:  You have: ? New symptoms. ? Unexplained weight loss. ? Difficulty swallowing or it hurts to swallow. ? Wheezing or a persistent cough. ? A hoarse voice.  Your symptoms do not improve with treatment. Get help right away if you:  Have pain in your arms, neck, jaw, teeth, or back.  Feel sweaty, dizzy, or light-headed.  Have chest pain or shortness of breath.  Vomit and your vomit looks like blood or coffee grounds.  Faint.  Have stool that is bloody or black.  Cannot swallow, drink, or eat. Summary  Gastroesophageal reflux happens when acid from the stomach flows up into the esophagus. GERD is a disease in which the reflux happens often, causes frequent or severe symptoms, or causes problems such as damage to the esophagus.  Treatment for this condition may vary depending on how severe your symptoms are. Your health care provider may recommend diet and lifestyle changes, medicine, or surgery.  Contact a health care provider if you have  new or worsening symptoms.  Take over-the-counter and prescription medicines only as told by your health care provider. Do not take aspirin, ibuprofen, or other NSAIDs unless your health care provider told you to do so.  Keep all follow-up visits as told by your health care provider. This is important. This information is not intended to replace advice given to you by your health care provider. Make sure you discuss any questions you have with your health care provider. Document Released: 07/30/2005 Document Revised: 04/28/2018 Document Reviewed: 04/28/2018 Elsevier Patient Education  [place].  I appreciate the  opportunity to care for you  Thank You   Harl Bowie , MD

## 2019-09-23 NOTE — Progress Notes (Signed)
Holly Benson    [id number]    [birth year]  Primary Care Physician:Morrow, Marjory Lies, MD  Referring Physician: London Pepper, MD [address] [place],  [place] [address]   Chief complaint:  Constipation  HPI: 85 year old female with history of cardiomyopathy, hypertension, COPD, paroxysmal A. fib, OSA here for follow-up visit.  She continues to have abdominal bloating and constipation.   She did not start taking Linzess.  She felt better after bowel purge with MiraLAX.  She wanted to discuss the potential side effects of Linzess before she starts taking it. She had recent medication changes for her heart and also is getting used to CPAP machine hence she did not want to start 1 more new medication She is having small bowel movement almost daily but does not feel that she has completely evacuated.  She is taking Benefiber once daily.  She has significant abdominal bloating with intermittent cramping and occasional reflux related symptoms. Denies dysphagia, vomiting, melena, loss of appetite, weight loss or blood per rectum.  No family history of colon cancer.  Outpatient Encounter Medications as of 09/23/2019  Medication Sig  . albuterol (PROVENTIL HFA;VENTOLIN HFA) 108 (90 Base) MCG/ACT inhaler Inhale 2 puffs into the lungs every 4 (four) hours as needed for wheezing or shortness of breath.  . AMBULATORY NON FORMULARY MEDICATION 3-4 tablets daily. Medication Name: Colon Clear  . amiodarone (PACERONE) 200 MG tablet Take 1 tablet (200 mg total) by mouth daily.  Marland Kitchen apixaban (ELIQUIS) 5 MG TABS tablet Take 1 tablet (5 mg total) by mouth 2 (two) times daily.  . cetirizine (ZYRTEC) 10 MG tablet Take 10 mg by mouth daily.  . Chlorpheniramine Maleate (ALLERGY PO) Take by mouth daily. Medication Name: allerchord A-20 drops daily-stated it is for allergies  . clonazePAM (KLONOPIN) 0.5 MG tablet Take 0.5 tablets by mouth as needed.  . Coenzyme Q10 (CO Q-10 PO) Take 1  tablet by mouth daily.  Marland Kitchen diltiazem (CARDIZEM CD) 300 MG 24 hr capsule Take 1 capsule (300 mg total) by mouth daily.  . hydrochlorothiazide (MICROZIDE) 12.5 MG capsule TAKE ONE CAPSULE BY MOUTH DAILY  . linaclotide (LINZESS) 290 MCG CAPS capsule Take 1 capsule (290 mcg total) by mouth daily before breakfast.  . metoprolol succinate (TOPROL-XL) 25 MG 24 hr tablet Take 12.5 mg by mouth.   . pravastatin (PRAVACHOL) 40 MG tablet Take 40 mg by mouth daily.  Marland Kitchen QUERCETIN PO Take 500 mg by mouth daily.  Marland Kitchen STIOLTO RESPIMAT 2.5-2.5 MCG/ACT AERS USE 2 INHALATIONS ORALLY   EVERY MORNING  . VITAMIN D PO Take 1 tablet by mouth daily.   No facility-administered encounter medications on file as of 09/23/2019.     Allergies as of 09/23/2019 - Review Complete 09/23/2019  Allergen Reaction Noted  . Lovenox [enoxaparin sodium] Hives 09/10/2017  . Lovenox [enoxaparin sodium] Rash 08/02/2019  . Amlodipine  07/21/2017  . Metoprolol  12/26/2016    Past Medical History:  Diagnosis Date  . Arrhythmia   . Arthritis   . Atrial fibrillation ([place])   . COPD (chronic obstructive pulmonary disease) ([facility])   . Hyperlipidemia   . Hypertension   . Sleep apnea     Past Surgical History:  Procedure Laterality Date  . ABDOMINAL HYSTERECTOMY    . arm surgery Right    Broken arm and has a plate in it  . CATARACT EXTRACTION Bilateral   . COLONOSCOPY     More than  10 years ago In [facility]  . KNEE ARTHROSCOPY Right     Family History  Problem Relation Age of Onset  . Suicidality Father   . Stroke Maternal Grandfather   . Colon cancer Neg Hx   . Esophageal cancer Neg Hx     Social History   Socioeconomic History  . Marital status: Widowed    Spouse name: Not on file  . Number of children: Not on file  . Years of education: Not on file  . Highest education level: Not on file  Occupational History  . Not on file  Social Needs  . Financial resource strain: Not on file  . Food insecurity    Worry:  Not on file    Inability: Not on file  . Transportation needs    Medical: Not on file    Non-medical: Not on file  Tobacco Use  . Smoking status: Former Smoker    Packs/day: 1.50    Years: 30.00    Pack years: 45.00    Quit date: 11/04/1991    Years since quitting: 27.9  . Smokeless tobacco: Never Used  Substance and Sexual Activity  . Alcohol use: Yes  . Drug use: No  . Sexual activity: Not on file  Lifestyle  . Physical activity    Days per week: Not on file    Minutes per session: Not on file  . Stress: Not on file  Relationships  . Social Herbalist on phone: Not on file    Gets together: Not on file    Attends religious service: Not on file    Active member of club or organization: Not on file    Attends meetings of clubs or organizations: Not on file    Relationship status: Not on file  . Intimate partner violence    Fear of current or ex partner: Not on file    Emotionally abused: Not on file    Physically abused: Not on file    Forced sexual activity: Not on file  Other Topics Concern  . Not on file  Social History Narrative  . Not on file      Review of systems: Review of Systems  Constitutional: Negative for fever and chills. Positive for lack of energy HENT: Positive for post nasal drip and sinus problem   Eyes: Negative for blurred vision.  Respiratory: Negative for cough, shortness of breath and wheezing.   Cardiovascular: Negative for chest pain and palpitations.  Gastrointestinal: as per HPI Genitourinary: Negative for dysuria, urgency, frequency and hematuria.  Musculoskeletal: Negative for myalgias, back pain and joint pain.  Skin: Negative for itching and rash.  Neurological: Negative for dizziness, tremors, focal weakness, seizures and loss of consciousness.  Endo/Heme/Allergies: Positive for seasonal allergies.  Psychiatric/Behavioral: Negative for depression, suicidal ideas and hallucinations.  All other systems reviewed and are  negative.   Physical Exam: Vitals:   09/23/19 0828  BP: 110/60  Pulse: 69  Temp: 97.6 F (36.4 C)   Body mass index is 25.15 kg/m. Gen:      No acute distress HEENT:  EOMI, sclera anicteric Neck:     No masses; no thyromegaly Lungs:    Clear to auscultation bilaterally; normal respiratory effort CV:         Regular rate and rhythm; no murmurs Abd:      + bowel sounds; soft, non-tender; no palpable masses, no distension Ext:    No edema; adequate peripheral perfusion Skin:  Warm and dry; no rash Neuro: alert and oriented x 3 Psych: normal mood and affect  Data Reviewed:  Reviewed labs, radiology imaging, old records and pertinent past GI work up   Assessment and Plan/Recommendations:  85 year old female with history of chronic A. fib on anticoagulation, hypertension, COPD, OSA here for follow-up of chronic constipation  Chronic idiopathic constipation: Benefiber 1 tablespoon 3 times daily with meals Increase dietary fiber and fluid intake Start Linzess 72 mcg daily  Large abdominal ventral hernia: Advised patient to use abdominal wall binder and avoid lifting heavy weights Continue conservative management given her age and comorbidities  Occasional GERD and dyspepsia: Discussed antireflux measures and lifestyle modifications Use Pepcid 20 mg at bedtime as needed  Return in 6 months or sooner if needed  25 minutes was spent face-to-face with the patient. Greater than 50% of the time used for counseling as well as treatment plan and follow-up. She had multiple questions which were answered to her satisfaction  K. Denzil Magnuson , MD    CC: London Pepper, MD

## 2019-09-28 ENCOUNTER — Encounter: Payer: Self-pay | Admitting: Gastroenterology

## 2019-10-11 ENCOUNTER — Ambulatory Visit (INDEPENDENT_AMBULATORY_CARE_PROVIDER_SITE_OTHER): Payer: Medicare Other | Admitting: Cardiology

## 2019-10-11 ENCOUNTER — Encounter: Payer: Self-pay | Admitting: Cardiology

## 2019-10-11 ENCOUNTER — Other Ambulatory Visit: Payer: Self-pay

## 2019-10-11 VITALS — BP 148/70 | HR 66 | Ht 62.5 in | Wt 144.6 lb

## 2019-10-11 DIAGNOSIS — I480 Paroxysmal atrial fibrillation: Secondary | ICD-10-CM

## 2019-10-11 MED ORDER — AMIODARONE HCL 200 MG PO TABS: ORAL_TABLET | ORAL | 6 refills | Status: DC

## 2019-10-11 NOTE — Progress Notes (Signed)
Electrophysiology Office Note   Date:  10/11/2019   ID:  Holly Benson, DOB February 06, 1933, MRN [id number]  PCP:  Holly Pepper, MD  Cardiologist:  Holly Benson Primary Electrophysiologist:  Holly Ventrella Meredith Leeds, MD    Chief Complaint: AF/flutter   History of Present Illness: Holly Benson is a 85 y.o. female who is being seen today for the evaluation of atrial flutter at the request of Holly Pepper, MD. Presenting today for electrophysiology evaluation.  She has a history of hypertension, atrial fibrillation/flutter status post cardioversion, hyperlipidemia, OSA on CPAP, and COPD.  She has had issues with shortness of breath and fatigue as well as spikes in her blood pressure.  She was having episodes of fatigue and shortness of breath.  It was thought that this was due to atrial flutter or atrial fibrillation.  Today, denies symptoms of palpitations, chest pain, shortness of breath, orthopnea, PND, lower extremity edema, claudication, dizziness, presyncope, syncope, bleeding, or neurologic sequela. The patient is tolerating medications without difficulties.  Unfortunately she continues to have episodic palpitations, mainly in the mornings.  She feels that this could potentially be due to caffeine.  She gets short of breath when this occurs.  Occurs once or twice a week.   Past Medical History:  Diagnosis Date   Arrhythmia    Arthritis    Atrial fibrillation ([facility])    COPD (chronic obstructive pulmonary disease) ([place])    Hyperlipidemia    Hypertension    Sleep apnea    Past Surgical History:  Procedure Laterality Date   ABDOMINAL HYSTERECTOMY     arm surgery Right    Broken arm and has a plate in it   CATARACT EXTRACTION Bilateral    COLONOSCOPY     More than 10 years ago In [place] ARTHROSCOPY Right      Current Outpatient Medications  Medication Sig Dispense Refill   albuterol (PROVENTIL HFA;VENTOLIN HFA) 108 (90 Base) MCG/ACT inhaler Inhale 2 puffs  into the lungs every 4 (four) hours as needed for wheezing or shortness of breath. 1 Inhaler 3   AMBULATORY NON FORMULARY MEDICATION 3-4 tablets daily. Medication Name: Colon Clear     amiodarone (PACERONE) 200 MG tablet Take 1 tablet (200 mg total) by mouth daily. 90 tablet 3   apixaban (ELIQUIS) 5 MG TABS tablet Take 1 tablet (5 mg total) by mouth 2 (two) times daily. 180 tablet 2   cetirizine (ZYRTEC) 10 MG tablet Take 10 mg by mouth daily.     Chlorpheniramine Maleate (ALLERGY PO) Take by mouth daily. Medication Name: allerchord A-20 drops daily-stated it is for allergies     clonazePAM (KLONOPIN) 0.5 MG tablet Take 0.5 tablets by mouth as needed.     Coenzyme Q10 (CO Q-10 PO) Take 1 tablet by mouth daily.     diltiazem (CARDIZEM CD) 300 MG 24 hr capsule Take 1 capsule (300 mg total) by mouth daily. 90 capsule 3   famotidine (PEPCID) 20 MG tablet Take 1 tablet (20 mg total) by mouth at bedtime. 30 tablet 3   hydrochlorothiazide (MICROZIDE) 12.5 MG capsule TAKE ONE CAPSULE BY MOUTH DAILY 90 capsule 3   linaclotide (LINZESS) 290 MCG CAPS capsule Take 1 capsule (290 mcg total) by mouth daily before breakfast. 30 capsule 3   metoprolol succinate (TOPROL-XL) 25 MG 24 hr tablet Take 12.5 mg by mouth.      pravastatin (PRAVACHOL) 40 MG tablet Take 40 mg by mouth daily.     QUERCETIN PO Take  500 mg by mouth daily.     STIOLTO RESPIMAT 2.5-2.5 MCG/ACT AERS USE 2 INHALATIONS ORALLY   EVERY MORNING 12 g 2   VITAMIN D PO Take 1 tablet by mouth daily.     No current facility-administered medications for this visit.     Allergies:   Lovenox [enoxaparin sodium], Lovenox [enoxaparin sodium], Amlodipine, and Metoprolol   Social History:  The patient  reports that she quit smoking about 27 years ago. She has a 45.00 pack-year smoking history. She has never used smokeless tobacco. She reports current alcohol use. She reports that she does not use drugs.   Family History:  The patient's  family history includes Stroke in her maternal grandfather; Suicidality in her father.    ROS:  Please see the history of present illness.   Otherwise, review of systems is positive for none.   All other systems are reviewed and negative.   PHYSICAL EXAM: VS:  BP (!) 148/70    Pulse 66    Ht 5' 2.5" (1.588 m)    Wt 144 lb 9.6 oz (65.6 kg)    SpO2 (!) 82%    BMI 26.03 kg/m  , BMI Body mass index is 26.03 kg/m. GEN: Well nourished, well developed, in no acute distress  HEENT: normal  Neck: no JVD, carotid bruits, or masses Cardiac: RRR; no murmurs, rubs, or gallops,no edema  Respiratory:  clear to auscultation bilaterally, normal work of breathing GI: soft, nontender, nondistended, + BS MS: no deformity or atrophy  Skin: warm and dry Neuro:  Strength and sensation are intact Psych: euthymic mood, full affect  EKG:  EKG is ordered today. Personal review of the ekg ordered shows this rhythm, rate 66  Recent Labs: 12/01/2018: Hemoglobin 14.2; Platelets 374 08/24/2019: ALT 18; BUN 20; Creatinine, Ser 0.89; Potassium 4.0; Sodium 136; TSH 1.860    Lipid Panel  No results found for: CHOL, TRIG, HDL, CHOLHDL, VLDL, LDLCALC, LDLDIRECT   Wt Readings from Last 3 Encounters:  10/11/19 144 lb 9.6 oz (65.6 kg)  09/23/19 142 lb (64.4 kg)  09/08/19 140 lb (63.5 kg)      Other studies Reviewed: Additional studies/ records that were reviewed today include: TTE 11/18/18  Review of the above records today demonstrates:  - Left ventricle: The cavity size was normal. Wall thickness was   increased in a pattern of mild LVH. Systolic function was normal.   The estimated ejection fraction was in the range of 60% to 65%.   Wall motion was normal; there were no regional wall motion   abnormalities. The study is not technically sufficient to allow   evaluation of LV diastolic function. - Mitral valve: Mildly thickened leaflets . There was trivial   regurgitation. - Left atrium: Moderately  dilated. - Right ventricle: The cavity size was mildly dilated. - Right atrium: Moderately dilated. - Tricuspid valve: There was moderate regurgitation. - Pulmonary arteries: PA peak pressure: 51 mm Hg (S). - Inferior vena cava: The vessel was dilated. The respirophasic   diameter changes were blunted (< 50%), consistent with elevated   central venous pressure.  Holter 03/26/18 personally reviewed Sinus Bradycardia / Sinus Rhythm / Sinus Arrhythmia/ Sinus Tachycardia, with intermittent First Degree AV Block, ? Junctional Rhythm and 2 pauses over 2 seconds - this appears to be 2nd degree type II AV block.  PVC's, and PAC's noted. Ventricular Couplets, Trigeminy. Atrial Pairs, Runs, Trigeminy. Diary entries correlate with PVC's, and PAC's. No atrial flutter was noted.  ASSESSMENT  AND PLAN:  1.  Typical atrial fibrillation/flutter: Currently on Eliquis, diltiazem, metoprolol.  CHA2DS2-VASc of 4.  Currently on amiodarone.  She is tolerating the medication well but has had a little bit more atrial fibrillation in the mornings.  Alison Kubicki increase amiodarone to 200 mg twice a day for a month.  I have also told her to try switching to decaf coffee in the mornings.   2. OSA: CPAP compliance encouraged   Case discussed with referring cardiologist   Current medicines are reviewed at length with the patient today.   The patient does not have concerns regarding her medicines.  The following changes were made today: None  Labs/ tests ordered today include:  Orders Placed This Encounter  Procedures   EKG 12-Lead     Disposition:   FU with Jarett Dralle 3 months  Signed, Nicolas Banh Meredith Leeds, MD  10/11/2019 9:15 AM     [facility] [address] [place] [address] [phone] (office) [phone] (fax)

## 2019-10-11 NOTE — Patient Instructions (Addendum)
Medication Instructions:  Your physician has recommended you make the following change in your medication:  1. INCREASE Amiodarone to 200 mg TWICE daily for one month, then reduce to 200 mg ONCE daily.  * If you need a refill on your cardiac medications before your next appointment, please call your pharmacy.   Labwork: None ordered  Testing/Procedures: None ordered  Follow-Up: At [facility], you and your health needs are our priority.  As part of our continuing mission to provide you with exceptional heart care, we have created designated Provider Care Teams.  These Care Teams include your primary Cardiologist (physician) and Advanced Practice Providers (APPs -  Physician Assistants and Nurse Practitioners) who all work together to provide you with the care you need, when you need it.  You will need a follow up appointment in 3 months.  Please call our office 2 months in advance to schedule this appointment.  You may see  one of the following Advanced Practice Providers on your designated Care Team:    Chanetta Marshall, NP  Tommye Standard, PA-C  [name], [place]  Your physician wants you to follow-up in: 6 months with Dr. Curt Bears. You will receive a reminder letter in the mail two months in advance. If you don't receive a letter, please call our office to schedule the follow-up appointment.   Thank you for choosing [facility]!!   Trinidad Curet, RN [phone]  Any Other Special Instructions Will Be Listed Below (If Applicable).

## 2019-10-12 ENCOUNTER — Ambulatory Visit (INDEPENDENT_AMBULATORY_CARE_PROVIDER_SITE_OTHER): Payer: Medicare Other | Admitting: Physician Assistant

## 2019-10-12 ENCOUNTER — Encounter: Payer: Self-pay | Admitting: Physician Assistant

## 2019-10-12 VITALS — BP 174/80 | HR 85 | Temp 95.9°F | Ht 62.5 in | Wt 147.0 lb

## 2019-10-12 DIAGNOSIS — J449 Chronic obstructive pulmonary disease, unspecified: Secondary | ICD-10-CM | POA: Diagnosis not present

## 2019-10-12 DIAGNOSIS — I10 Essential (primary) hypertension: Secondary | ICD-10-CM | POA: Diagnosis not present

## 2019-10-12 DIAGNOSIS — E785 Hyperlipidemia, unspecified: Secondary | ICD-10-CM | POA: Diagnosis not present

## 2019-10-12 DIAGNOSIS — G4733 Obstructive sleep apnea (adult) (pediatric): Secondary | ICD-10-CM

## 2019-10-12 DIAGNOSIS — I480 Paroxysmal atrial fibrillation: Secondary | ICD-10-CM

## 2019-10-12 DIAGNOSIS — Z9989 Dependence on other enabling machines and devices: Secondary | ICD-10-CM

## 2019-10-12 MED ORDER — BISOPROLOL FUMARATE 5 MG PO TABS: 5.0000 mg | ORAL_TABLET | Freq: Every day | ORAL | 0 refills | Status: DC

## 2019-10-12 NOTE — Patient Instructions (Addendum)
Medication Instructions:   START BISOPROLOL 5 MG DAILY  STOP METOPROLOL SUCCINATE *If you need a refill on your cardiac medications before your next appointment, please call your pharmacy*  Lab Work:  NONE ordered at this time of appointment   If you have labs (blood work) drawn today and your tests are completely normal, you will receive your results only by: Marland Kitchen MyChart Message (if you have MyChart) OR . A paper copy in the mail If you have any lab test that is abnormal or we need to change your treatment, we will call you to review the results.  Testing/Procedures:  NONE ordered at this time of appointment   Follow-Up: At [facility], you and your health needs are our priority.  As part of our continuing mission to provide you with exceptional heart care, we have created designated Provider Care Teams.  These Care Teams include your primary Cardiologist (physician) and Advanced Practice Providers (APPs -  Physician Assistants and Nurse Practitioners) who all work together to provide you with the care you need, when you need it.  Your next appointment:   1-2 month(s)  The format for your next appointment:   In Person  Provider:   Raliegh Ip Mali Hilty, MD or [name], PA-C  Other Instructions

## 2019-10-12 NOTE — Progress Notes (Signed)
Cardiology Office Note:    Date:  10/12/2019   ID:  Holly Benson, DOB 1933-09-18, MRN [id number]  PCP:  London Pepper, MD  Cardiologist:  Pixie Casino, MD  Electrophysiologist:  Constance Haw, MD   Referring MD: London Pepper, MD   Chief Complaint  Patient presents with  . Follow-up    seen for Dr. Debara Pickett, palpitation, high BP    History of Present Illness:    Holly Benson is a 85 y.o. female with a hx of hypertension, hyperlipidemia, obstructive sleep apnea on CPAP, COPD and history of atrial flutter/atrial fibrillation.  Patient previously had DC cardioversion while living in [place] and has been on Eliquis since.  Patient was started on flecainide by Dr. Curt Bears in September 2020, she converted to sinus rhythm however had increasing shortness of breath which improved off of flecainide.  During her last EP office visit on 08/24/2019, she was started on 200 mg daily of amiodarone.  Her previous echocardiogram in January 2020 showed EF 60 to 65% with peak PA pressure of 31 mmHg concerning for moderate pulmonary hypertension.  It was suspected she had pulmonary hypertension related to obstructive sleep apnea.  She was started on CPAP therapy in October.   Patient was seen by Dr. Curt Bears yesterday at which time she continued to have episodic palpitation mainly in the morning.  Her amiodarone was increased to 200 mg twice daily dosing for 1 month before titrating down to 200 mg daily.  Patient presents today for cardiology office visit, she says she is still having shortness of breath in the setting of palpitation.  Palpitation has decreased for the past 2 days.  She has also avoided any caffeine for the past 2 days as well.  She denies any chest pain or significant lower extremity edema.  We discussed about increasing metoprolol to a full dose, however she says she had history of breathing issue on the higher dose of metoprolol.  I will switch this to bisoprolol 5 mg daily to  see if we can control her blood pressure and heart rate at the same time without causing significant side effects.  Her blood pressure today is quite high, she says her blood pressure has been high for the past few days.  I suspect there is a anxiety component.  On physical exam, her heart rate is quite regular, EKG obtained yesterday showed normal sinus rhythm.  She has good cardiac awareness and that can usually tell when she is going back into atrial fibrillation.  She says she has not experienced any palpitation for 2 days.   Past Medical History:  Diagnosis Date  . Arrhythmia   . Arthritis   . Atrial fibrillation ([place])   . COPD (chronic obstructive pulmonary disease) ([place])   . Hyperlipidemia   . Hypertension   . Sleep apnea     Past Surgical History:  Procedure Laterality Date  . ABDOMINAL HYSTERECTOMY    . arm surgery Right    Broken arm and has a plate in it  . CATARACT EXTRACTION Bilateral   . COLONOSCOPY     More than 10 years ago In [facility]  . KNEE ARTHROSCOPY Right     Current Medications: Current Meds  Medication Sig  . albuterol (PROVENTIL HFA;VENTOLIN HFA) 108 (90 Base) MCG/ACT inhaler Inhale 2 puffs into the lungs every 4 (four) hours as needed for wheezing or shortness of breath.  . AMBULATORY NON FORMULARY MEDICATION 3-4 tablets daily. Medication Name: Colon Clear  .  amiodarone (PACERONE) 200 MG tablet Take 1 tablet (200 mg total) by mouth daily.  Marland Kitchen amiodarone (PACERONE) 200 MG tablet Take 1 tablet (200 mg total) TWICE a day for one month, then reduce to normal dosing of 1 tablet ONCE daily  . apixaban (ELIQUIS) 5 MG TABS tablet Take 1 tablet (5 mg total) by mouth 2 (two) times daily.  . cetirizine (ZYRTEC) 10 MG tablet Take 10 mg by mouth daily.  . Chlorpheniramine Maleate (ALLERGY PO) Take by mouth daily. Medication Name: allerchord A-20 drops daily-stated it is for allergies  . clonazePAM (KLONOPIN) 0.5 MG tablet Take 0.5 tablets by mouth as needed.  .  Coenzyme Q10 (CO Q-10 PO) Take 1 tablet by mouth daily.  Marland Kitchen diltiazem (CARDIZEM CD) 300 MG 24 hr capsule Take 1 capsule (300 mg total) by mouth daily.  . famotidine (PEPCID) 20 MG tablet Take 1 tablet (20 mg total) by mouth at bedtime.  . hydrochlorothiazide (MICROZIDE) 12.5 MG capsule TAKE ONE CAPSULE BY MOUTH DAILY  . linaclotide (LINZESS) 290 MCG CAPS capsule Take 1 capsule (290 mcg total) by mouth daily before breakfast.  . metoprolol succinate (TOPROL-XL) 25 MG 24 hr tablet Take 12.5 mg by mouth.   . pravastatin (PRAVACHOL) 40 MG tablet Take 40 mg by mouth daily.  Marland Kitchen QUERCETIN PO Take 500 mg by mouth daily.  Marland Kitchen STIOLTO RESPIMAT 2.5-2.5 MCG/ACT AERS USE 2 INHALATIONS ORALLY   EVERY MORNING  . VITAMIN D PO Take 1 tablet by mouth daily.     Allergies:   Lovenox [enoxaparin sodium], Lovenox [enoxaparin sodium], Amlodipine, and Metoprolol   Social History   Socioeconomic History  . Marital status: Widowed    Spouse name: Not on file  . Number of children: Not on file  . Years of education: Not on file  . Highest education level: Not on file  Occupational History  . Not on file  Social Needs  . Financial resource strain: Not on file  . Food insecurity    Worry: Not on file    Inability: Not on file  . Transportation needs    Medical: Not on file    Non-medical: Not on file  Tobacco Use  . Smoking status: Former Smoker    Packs/day: 1.50    Years: 30.00    Pack years: 45.00    Quit date: 11/04/1991    Years since quitting: 27.9  . Smokeless tobacco: Never Used  Substance and Sexual Activity  . Alcohol use: Yes  . Drug use: No  . Sexual activity: Not on file  Lifestyle  . Physical activity    Days per week: Not on file    Minutes per session: Not on file  . Stress: Not on file  Relationships  . Social Herbalist on phone: Not on file    Gets together: Not on file    Attends religious service: Not on file    Active member of club or organization: Not on file     Attends meetings of clubs or organizations: Not on file    Relationship status: Not on file  Other Topics Concern  . Not on file  Social History Narrative  . Not on file     Family History: The patient's family history includes Stroke in her maternal grandfather; Suicidality in her father. There is no history of Colon cancer or Esophageal cancer.  ROS:   Please see the history of present illness.     All other systems  reviewed and are negative.  EKGs/Labs/Other Studies Reviewed:    The following studies were reviewed today:  Echo 11/18/2017 LV EF: 60% -   65% Study Conclusions  - Left ventricle: The cavity size was normal. Wall thickness was   increased in a pattern of mild LVH. Systolic function was normal.   The estimated ejection fraction was in the range of 60% to 65%.   Wall motion was normal; there were no regional wall motion   abnormalities. The study is not technically sufficient to allow   evaluation of LV diastolic function. - Mitral valve: Mildly thickened leaflets . There was trivial   regurgitation. - Left atrium: Moderately dilated. - Right ventricle: The cavity size was mildly dilated. - Right atrium: Moderately dilated. - Tricuspid valve: There was moderate regurgitation. - Pulmonary arteries: PA peak pressure: 51 mm Hg (S). - Inferior vena cava: The vessel was dilated. The respirophasic   diameter changes were blunted (< 50%), consistent with elevated   central venous pressure.  Impressions:  - LVEF 60-65%, mild LVH, normal wall motion, trivial MR, moderate   biatrial enlargement, mild RVE, RVSP 51 mmHg, dilated IVC.    Myoview 12/08/2018 Study Highlights   The left ventricular ejection fraction is hyperdynamic (>65%).  Nuclear stress EF: 71%.  There was downsloping of the ST segments in the inferolateral leads during infusion with no significan J point depression which rapidly resolved in early recovery.  The study is normal.  This is a  low risk study.     EKG:  EKG is not ordered today.    Recent Labs: 12/01/2018: Hemoglobin 14.2; Platelets 374 08/24/2019: ALT 18; BUN 20; Creatinine, Ser 0.89; Potassium 4.0; Sodium 136; TSH 1.860  Recent Lipid Panel No results found for: CHOL, TRIG, HDL, CHOLHDL, VLDL, LDLCALC, LDLDIRECT  Physical Exam:    VS:  BP (!) 174/80   Pulse 85   Temp (!) 95.9 F (35.5 C)   Ht 5' 2.5" (1.588 m)   Wt 147 lb (66.7 kg)   SpO2 94%   BMI 26.46 kg/m     Wt Readings from Last 3 Encounters:  10/12/19 147 lb (66.7 kg)  10/11/19 144 lb 9.6 oz (65.6 kg)  09/23/19 142 lb (64.4 kg)     GEN:  Well nourished, well developed in no acute distress HEENT: Normal NECK: No JVD; No carotid bruits LYMPHATICS: No lymphadenopathy CARDIAC: RRR, no murmurs, rubs, gallops RESPIRATORY:  Clear to auscultation without rales, wheezing or rhonchi  ABDOMEN: Soft, non-tender, non-distended MUSCULOSKELETAL:  No edema; No deformity  SKIN: Warm and dry NEUROLOGIC:  Alert and oriented x 3 PSYCHIATRIC:  Normal affect   ASSESSMENT:    1. PAF (paroxysmal atrial fibrillation) ([place])   2. Essential hypertension   3. Chronic obstructive pulmonary disease, unspecified COPD type ([place])   4. OSA on CPAP   5. Hyperlipidemia, unspecified hyperlipidemia type    PLAN:    In order of problems listed above:  1. PAF: Amiodarone was recently uptitrated by Dr. Curt Bears to 200 mg twice daily for 1 month before titrating down to 200 mg daily thereafter.  Since then, the frequency of palpitation has significantly decreased.  Continue Eliquis and diltiazem  2. Hypertension: We will switch metoprolol to bisoprolol 5 mg daily  3. Hyperlipidemia: On pravastatin  4. COPD: No recent exacerbation  5. Obstructive sleep apnea: on CPAP therapy.   Medication Adjustments/Labs and Tests Ordered: Current medicines are reviewed at length with the patient today.  Concerns regarding medicines  are outlined above.  No orders of the  defined types were placed in this encounter.  No orders of the defined types were placed in this encounter.   There are no Patient Instructions on file for this visit.   Hilbert Corrigan, PA  10/12/2019 10:00 AM    [place]

## 2019-10-13 ENCOUNTER — Telehealth: Payer: Self-pay | Admitting: *Deleted

## 2019-10-13 NOTE — Telephone Encounter (Signed)
Patient called to report that she can only sleep 3 to 4 hours per night because her ASV machine will wake her up "blowing hard."  Sometimes she is unable to go back to sleep. Also for the last 3-4 days she has been getting nose bleeds, along with headaches. She states that once she takes off her mask the headaches will go away. I recommended that she try using some nasal saline. The nose bleeds could be the result of her nasal passages becoming too dry. Dr Claiborne Billings will be notified of her symptoms. I will call her back with his recommendations. Patient voiced understanding and will await a return call back from me.

## 2019-10-26 MED ORDER — APIXABAN 5 MG PO TABS: 5.0000 mg | ORAL_TABLET | Freq: Two times a day (BID) | ORAL | 2 refills | Status: DC

## 2019-11-07 ENCOUNTER — Telehealth: Payer: Self-pay | Admitting: Physician Assistant

## 2019-11-07 NOTE — Telephone Encounter (Signed)
Spoke with pt and since starting the Bisoprolol 5 mg has had 2 episodes with low b/p and hr.  Pt also notes no energy and muscle weakness  Per pt did take med last night and feels okay today  B/P this am was 147/70 this higher than usual is usually in the 120's and HR is 57 and this is the usual.Pt was asking if could go back on the Metoprolol WIll forward to Dr Ellyn Hack for review and recommendations ./cy

## 2019-11-07 NOTE — Telephone Encounter (Signed)
Pt c/o medication issue:  1. Name of Medication: bisoprolol (ZEBETA) 5 MG tablet  2. How are you currently taking this medication (dosage and times per day)? Once a day at night  3. Are you having a reaction (difficulty breathing--STAT)? No  4. What is your medication issue? muscle weakness. States she couldn't walk across the room on 1/2.   Patient states on Saturday her BP got very low 95/40's hr 30's. States her BP and HR are okay today and yesterday. She was very tired yesterday, but is doing okay today. She would like to know if she should go back on Metoprolol instead of Bisoprolol.

## 2019-11-07 NOTE — Telephone Encounter (Signed)
I think for now maybe what we should have her do is just go back to her previous beta-blocker/metoprolol.  I am not sure what the reasoning was for switching to bisoprolol.  As long as blood pressure is low but they currently are, I think were okay.  I will forward this to Dr. Debara Pickett and Dr. Curt Bears in their opinion.  Glenetta Hew, MD

## 2019-11-08 MED ORDER — METOPROLOL SUCCINATE ER 25 MG PO TB24: 12.5000 mg | ORAL_TABLET | Freq: Every day | ORAL | 3 refills | Status: DC

## 2019-11-08 NOTE — Telephone Encounter (Signed)
Agree - would go back to metoprolol.  Dr Lemmie Evens

## 2019-11-08 NOTE — Telephone Encounter (Signed)
PT AWARE AND METOPROLOL SCRIPT SENT VIA Epic TO [facility] [place] PER PT'S REQUEST .Adonis Housekeeper

## 2019-11-18 ENCOUNTER — Other Ambulatory Visit: Payer: Self-pay | Admitting: Internal Medicine

## 2019-11-25 ENCOUNTER — Telehealth: Payer: Self-pay | Admitting: Internal Medicine

## 2019-11-25 MED ORDER — DILTIAZEM HCL ER COATED BEADS 300 MG PO CP24: 300.0000 mg | ORAL_CAPSULE | Freq: Every day | ORAL | 3 refills | Status: DC

## 2019-11-25 NOTE — Telephone Encounter (Signed)
Pt's medication was sent to pt's pharmacy as requested. Confirmation received.  °

## 2019-11-25 NOTE — Telephone Encounter (Signed)
New Message   *STAT* If patient is at the pharmacy, call can be transferred to refill team.   1. Which medications need to be refilled? (please list name of each medication and dose if known) diltiazem (CARDIZEM CD) 300 MG 24 hr capsule  2. Which pharmacy/location (including street and city if local pharmacy) is medication to be sent to? [id number] [place], [place] - [address], [address]  3. Do they need a 30 day or 90 day supply? 90 day  Patient is out of medication and is stuck out of town.

## 2019-12-01 ENCOUNTER — Telehealth: Payer: Self-pay | Admitting: Cardiology

## 2019-12-01 DIAGNOSIS — I480 Paroxysmal atrial fibrillation: Secondary | ICD-10-CM | POA: Diagnosis not present

## 2019-12-01 NOTE — Telephone Encounter (Signed)
Patient c/o Palpitations:  High priority if patient c/o lightheadedness, shortness of breath, or chest pain  1) How long have you had palpitations/irregular HR/ Afib? Are you having the symptoms now? 3-4 years on and off. Patient is having symptoms currently.  2) Are you currently experiencing lightheadedness, SOB or CP? SOB and lightheadedness  3) Do you have a history of afib (atrial fibrillation) or irregular heart rhythm? Yes  4) Have you checked your BP or HR? (document readings if available):  01/28:  BP: 105/57 HR: 40  01/27:  BP: 124/69 HR: 49  5) Are you experiencing any other symptoms? Fatigue

## 2019-12-01 NOTE — Telephone Encounter (Signed)
Correction- moved appointment to Feb 3rd. Patient notified.

## 2019-12-01 NOTE — Telephone Encounter (Signed)
Called patient- she stated that she has had some issues with her AFIB- she complaints that in the last two weeks she has noticed it getting worse, and other symptoms (SOB, weakness, becoming dizzy) she also has been having pain in her shoulder, and neck area- unsure if coming from her back issues, or if it was something else.  She states that her BP today was 105/57 HR 40, and yesterday was 124/69 HR 49. When she has the episodes she does not feel like getting up to check so she only has the last two days. She is noted SOB on the phone while speaking to me, I offered an appointment today or tomorrow- but patient is not in town, she is out of state with her son, but was willing to fly back home next week. I did advise patient not to wait that long to be evaluated and sent her to the ED where she was at, and they could notify us of her condition- she will send any information over to [facility] and Dr.Hilty to make them aware. I did go ahead and schedule an office visit appointment with NP on Feb 3rd at 1:30, she is hoping to change her flight to come home by then.   Patient verbalized understanding that she would go to be evaluated where she was (did not give location)

## 2019-12-01 NOTE — Telephone Encounter (Signed)
New Message     Pt is calling to leave a message with Dr Curt Bears, she says she was advised to go to the ER this morning and they told her rhythm was normal. But she says they cant seem to catch it when the episode is happening.  She says she got a mobile device for EKG hoping to catch it and would like to send it in when it happens She wants to know how to send one    Please advise

## 2019-12-01 NOTE — Telephone Encounter (Signed)
Advised to attach pdf strip to a mychart message and send that way. Pt agreeable to plan

## 2019-12-02 ENCOUNTER — Ambulatory Visit: Payer: Medicare Other

## 2019-12-02 NOTE — Telephone Encounter (Signed)
MD responded to patient's MyChart message [place] like afib with slow ventricular response . I think the HR in the 40's is why you are fatigued. I would stop the metoprolol - continue amiodarone. We can re-assess on 2/3.   Dr Lemmie Evens

## 2019-12-05 NOTE — Progress Notes (Signed)
Cardiology Clinic Note   Patient Name: Holly Benson Date of Encounter: 12/07/2019  Primary Care Provider:  London Pepper, MD Primary Cardiologist:  Pixie Casino, MD  Patient Profile    Holly Benson 86 year old female presents today for follow-up evaluation of her paroxysmal atrial fibrillation, essential hypertension, cardiomyopathy, mixed hyperlipidemia, and atrial flutter.  Past Medical History    Past Medical History:  Diagnosis Date  . Arrhythmia   . Arthritis   . Atrial fibrillation ([place])   . COPD (chronic obstructive pulmonary disease) ([place])   . Hyperlipidemia   . Hypertension   . Sleep apnea    Past Surgical History:  Procedure Laterality Date  . ABDOMINAL HYSTERECTOMY    . arm surgery Right    Broken arm and has a plate in it  . CATARACT EXTRACTION Bilateral   . COLONOSCOPY     More than 10 years ago In [facility]  . KNEE ARTHROSCOPY Right     Allergies  Allergies  Allergen Reactions  . Lovenox [Enoxaparin Sodium] Hives  . Lovenox [Enoxaparin Sodium] Rash    PT STATES SHE BROKE OUT IN A RASH HEAD TO TOE AND LASTED ABOUT 3 WEEKS   . Amlodipine     Muscle pain  . Metoprolol     Muscle aches    History of Present Illness    Ms. Degregorio has a past medical history of essential hypertension, hyperlipidemia, obstructive sleep apnea on CPAP, COPD, and history of atrial flutter/atrial fibrillation. Previously had DCCV while she was living in [place] and has been on Eliquis since that time. She was started on flecainide by Dr. Curt Bears in September 2020, she converted to sinus rhythm however had increasing shortness of breath which improved when her flecainide was stopped. During her last EP visit 08/23/2019, she was started on 200 mg daily amiodarone. Her previous echocardiogram 11/2018 showed an EF 60-65% with peak PA pressures of 31 mmHg concerning for moderate pulmonary hypertension. It was suspected that her pulmonary hypertension was related to  her OSA. She was started on CPAP therapy October 2020.  She was seen by Dr. Curt Bears 10/11/2019 and during that visit she described   episodes of palpitations which would happen mainly in the morning. Her amiodarone was increased to 200 mg twice daily for 1 month before titrating down to 200 mg daily.  She was seen by [name], PA-C on 10/12/2019 and stated that she was still having episodes of shortness of breath in the setting of palpitations. Her palpitations have decreased over the past 2 days. At that time she was advised to avoid caffeine. She denied chest pain and lower extremity edema. Increasing her metoprolol was discussed however, she had concerns with her breathing history and higher doses of metoprolol. Her metoprolol was DC'd at that time and bisoprolol 5 mg daily was started for rate control as well as blood pressure. At that time her blood pressure was quite high and had been high for the past few days.  It was believed that anxiety was contributing to her hypertension as well.  She continued to be in normal sinus rhythm at that time.  She had good cardiac awareness and had not experienced any palpitations in the prior 2 days.  On 11/07/2019 she contacted the nurse triage line and indicated that her blood pressure was low with a heart rate of 57.  She also indicated that she had no energy and muscle weakness.  Her bisoprolol was stopped at that time.  Her metoprolol succinate 12.5 was restarted.  On 12/01/2019 she presented to the emergency department due to an irregular rhythm.  Her EKG in the emergency room indicated that she was a normal sinus rhythm at that time.  Dr. Debara Pickett reviewed her rhythm strip showing a heart rate in the 40s which was believed to be atrial fibrillation.  Her metoprolol was stopped at that time and her amiodarone was continued.  She again contacted nurse triage line on 12/06/2019 and indicated that she had accidentally taken an extra dose of amiodarone.  Her rhythm strip  was reviewed at that time which appeared to be normal sinus rhythm with a number of PACs.  She presents to the clinic today for follow-up and states she feels better today.  She has purchased a cardiac monitor which she shows me her rhythm from this morning.  It appears to be sinus rhythm.  She has noticed a pattern with her atrial fibrillation mainly presenting in the morning and sporadically in the evening.  She notices that when she feels badly with her atrial fibrillation her blood pressure is usually low as well.  I will continue to hold metoprolol for now.  She is compliant with her CPAP.  She states she does have wine nightly with dinner.  This was discussed as a possible trigger for atrial fibrillation along with caffeine, chocolate, and OSA.  She asked if a cardiac monitor would be beneficial at this time.  Given that she is on guideline therapy and in sinus rhythm today I will not order a cardiac monitor at this time.  She also indicates that she has been having trouble sleeping and gets about 3 hours of sleep per night.  I will give her the sleep hygiene information sheet.  She does state that she has more restful sleep only with her CPAP use.  She denies chest pain, shortness of breath, lower extremity edema, fatigue, increased palpitations, melena, hematuria, hemoptysis, diaphoresis, weakness, orthopnea, and PND.   Home Medications    Prior to Admission medications   Medication Sig Start Date End Date Taking? Authorizing Provider  albuterol (PROVENTIL HFA;VENTOLIN HFA) 108 (90 Base) MCG/ACT inhaler Inhale 2 puffs into the lungs every 4 (four) hours as needed for wheezing or shortness of breath. 01/27/19   Byrum, Rose Fillers, MD  AMBULATORY NON FORMULARY MEDICATION 3-4 tablets daily. Medication Name: Colon Clear    [provider]  amiodarone (PACERONE) 200 MG tablet Take 1 tablet (200 mg total) by mouth daily. 08/24/19   [name], PA-C  amiodarone (PACERONE) 200 MG  tablet Take 1 tablet (200 mg total) TWICE a day for one month, then reduce to normal dosing of 1 tablet ONCE daily 10/11/19   Camnitz, Ocie Doyne, MD  apixaban (ELIQUIS) 5 MG TABS tablet Take 1 tablet (5 mg total) by mouth 2 (two) times daily. 10/26/19   Hilty, Nadean Corwin, MD  cetirizine (ZYRTEC) 10 MG tablet Take 10 mg by mouth daily.    [provider]  Chlorpheniramine Maleate (ALLERGY PO) Take by mouth daily. Medication Name: allerchord A-20 drops daily-stated it is for allergies    [provider]  clonazePAM (KLONOPIN) 0.5 MG tablet Take 0.5 tablets by mouth as needed. 09/02/18   [provider]  Coenzyme Q10 (CO Q-10 PO) Take 1 tablet by mouth daily.    [provider]  diltiazem (CARDIZEM CD) 300 MG 24 hr capsule Take 1 capsule (300 mg total) by mouth daily. 11/25/19   Hilty,  Nadean Corwin, MD  famotidine (PEPCID) 20 MG tablet Take 1 tablet (20 mg total) by mouth at bedtime. 09/23/19   Mauri Pole, MD  hydrochlorothiazide (MICROZIDE) 12.5 MG capsule TAKE ONE CAPSULE BY MOUTH DAILY 01/26/19   Hilty, Nadean Corwin, MD  linaclotide [facility]) 290 MCG CAPS capsule Take 1 capsule (290 mcg total) by mouth daily before breakfast. 08/26/19   Nandigam, Venia Minks, MD  losartan (COZAAR) 100 MG tablet TAKE ONE TABLET BY MOUTH EVERY MORNING 11/18/19   Hilty, Nadean Corwin, MD  metoprolol succinate (TOPROL-XL) 25 MG 24 hr tablet TAKE 1/2 TABLET BY MOUTH DAILY 11/18/19   Hilty, Nadean Corwin, MD  pravastatin (PRAVACHOL) 40 MG tablet Take 40 mg by mouth daily.    [provider]  QUERCETIN PO Take 500 mg by mouth daily.    [provider]  STIOLTO RESPIMAT 2.5-2.5 MCG/ACT AERS USE 2 INHALATIONS ORALLY   EVERY MORNING 05/05/19   Collene Gobble, MD  VITAMIN D PO Take 1 tablet by mouth daily.    [provider]    Family History    Family History  Problem Relation Age of Onset  . Suicidality Father   . Stroke Maternal Grandfather   . Colon cancer Neg Hx     . Esophageal cancer Neg Hx    She indicated that her mother is deceased. She indicated that her father is deceased. She indicated that her maternal grandfather is deceased. She indicated that the status of her neg hx is unknown.  Social History    Social History   Socioeconomic History  . Marital status: Widowed    Spouse name: Not on file  . Number of children: Not on file  . Years of education: Not on file  . Highest education level: Not on file  Occupational History  . Not on file  Tobacco Use  . Smoking status: Former Smoker    Packs/day: 1.50    Years: 30.00    Pack years: 45.00    Quit date: 11/04/1991    Years since quitting: 28.1  . Smokeless tobacco: Never Used  Substance and Sexual Activity  . Alcohol use: Yes  . Drug use: No  . Sexual activity: Not on file  Other Topics Concern  . Not on file  Social History Narrative  . Not on file   Social Determinants of Health   Financial Resource Strain:   . Difficulty of Paying Living Expenses: Not on file  Food Insecurity:   . Worried About Charity fundraiser in the Last Year: Not on file  . Ran Out of Food in the Last Year: Not on file  Transportation Needs:   . Lack of Transportation (Medical): Not on file  . Lack of Transportation (Non-Medical): Not on file  Physical Activity:   . Days of Exercise per Week: Not on file  . Minutes of Exercise per Session: Not on file  Stress:   . Feeling of Stress : Not on file  Social Connections:   . Frequency of Communication with Friends and Family: Not on file  . Frequency of Social Gatherings with Friends and Family: Not on file  . Attends Religious Services: Not on file  . Active Member of Clubs or Organizations: Not on file  . Attends Archivist Meetings: Not on file  . Marital Status: Not on file  Intimate Partner Violence:   . Fear of Current or Ex-Partner: Not on file  . Emotionally Abused: Not on file  .  Physically Abused: Not on file  . Sexually  Abused: Not on file     Review of Systems    General:  No chills, fever, night sweats or weight changes.  Cardiovascular:  No chest pain, dyspnea on exertion, edema, orthopnea, palpitations, paroxysmal nocturnal dyspnea. Dermatological: No rash, lesions/masses Respiratory: No cough, dyspnea Urologic: No hematuria, dysuria Abdominal:   No nausea, vomiting, diarrhea, bright red blood per rectum, melena, or hematemesis Neurologic:  No visual changes, wkns, changes in mental status. All other systems reviewed and are otherwise negative except as noted above.  Physical Exam    VS:  BP 137/63   Pulse 64   Temp (!) 97 F (36.1 C)   Ht 5\' 2"  (1.575 m)   Wt 140 lb 9.6 oz (63.8 kg)   SpO2 95%   BMI 25.72 kg/m  , BMI Body mass index is 25.72 kg/m. GEN: Well nourished, well developed, in no acute distress. HEENT: normal. Neck: Supple, no JVD, carotid bruits, or masses. Cardiac: RRR, no murmurs, rubs, or gallops. No clubbing, cyanosis, edema.  Radials/DP/PT 2+ and equal bilaterally.  Respiratory:  Respirations regular and unlabored, clear to auscultation bilaterally. GI: Soft, nontender, nondistended, BS + x 4. MS: no deformity or atrophy. Skin: warm and dry, no rash. Neuro:  Strength and sensation are intact. Psych: Normal affect.  Accessory Clinical Findings    ECG personally reviewed by me today-normal sinus rhythm left axis deviation incomplete right bundle branch block ST and T wave abnormality consider anterior lateral ischemia 64 bpm.  EKG 12/01/2019 Sinus rhythm 64 bpm no T wave or ST deviation  Echocardiogram 11/18/2017 Study Conclusions   - Left ventricle: The cavity size was normal. Wall thickness was  increased in a pattern of mild LVH. Systolic function was normal.  The estimated ejection fraction was in the range of 60% to 65%.  Wall motion was normal; there were no regional wall motion  abnormalities. The study is not technically sufficient to allow    evaluation of LV diastolic function.  - Mitral valve: Mildly thickened leaflets . There was trivial  regurgitation.  - Left atrium: Moderately dilated.  - Right ventricle: The cavity size was mildly dilated.  - Right atrium: Moderately dilated.  - Tricuspid valve: There was moderate regurgitation.  - Pulmonary arteries: PA peak pressure: 51 mm Hg (S).  - Inferior vena cava: The vessel was dilated. The respirophasic  diameter changes were blunted (< 50%), consistent with elevated  central venous pressure.  Cardiac event monitor 03/10/2018 Sinus Bradycardia / Sinus Rhythm / Sinus Arrhythmia/ Sinus Tachycardia, with intermittent First Degree AV Block, ? Junctional Rhythm and 2 pauses over 2 seconds - this appears to be 2nd degree type II AV block.  PVC's, and PAC's noted. Ventricular Couplets, Trigeminy. Atrial Pairs, Runs, Trigeminy. Diary entries correlate with PVC's, and PAC's. No atrial flutter was noted.   Assessment & Plan   1.  Paroxysmal atrial fibrillation-EKG today shows normal sinus rhythm left axis deviation incomplete right bundle branch block ST and T wave abnormality consider anterior lateral ischemia 64 bpm.  Recently had hypotension with bisoprolol and was switched back to metoprolol.  On metoprolol she became bradycardic and metoprolol was stopped.  Continue amiodarone 200 mg daily Continue Eliquis 5 mg twice daily Continue diltiazem 300 mg daily Avoid triggers caffeine, chocolate, EtOH, sleep apnea, etc.  Followed by Dr. Curt Bears.  Essential hypertension-BP today 137/63.  Metoprolol held due to bradycardia. Continue HCTZ 12.5 mg daily Continue losartan 100 mg  daily Continue diltiazem 300 mg daily Heart healthy low-sodium diet-salty 6 given Increase physical activity as tolerated  Hyperlipidemia-LDL113 09/12/2019 Continue pravastatin 40 mg daily Continue co-Q10 Heart healthy low-sodium high-fiber diet Increase physical activity as tolerated Repeat lipid panel  prior to 3-month follow-up visit  Obstructive sleep apnea-compliant with CPAP use Continue CPAP Continue weight loss Discussed benefits of continued compliance  Disposition: Follow-up with Dr. Earlyne Iba as scheduled and Dr. Debara Pickett in 3 months.  Jossie Ng. [facility] [place] [address] Office [phone] Fax [phone]

## 2019-12-07 ENCOUNTER — Ambulatory Visit (INDEPENDENT_AMBULATORY_CARE_PROVIDER_SITE_OTHER): Payer: Medicare Other | Admitting: General Practice

## 2019-12-07 ENCOUNTER — Encounter: Payer: Self-pay | Admitting: General Practice

## 2019-12-07 ENCOUNTER — Other Ambulatory Visit: Payer: Self-pay

## 2019-12-07 ENCOUNTER — Ambulatory Visit: Payer: Medicare Other | Admitting: Internal Medicine

## 2019-12-07 VITALS — BP 137/63 | HR 64 | Temp 97.0°F | Ht 62.0 in | Wt 140.6 lb

## 2019-12-07 DIAGNOSIS — Z79899 Other long term (current) drug therapy: Secondary | ICD-10-CM | POA: Diagnosis not present

## 2019-12-07 DIAGNOSIS — Z9989 Dependence on other enabling machines and devices: Secondary | ICD-10-CM | POA: Diagnosis not present

## 2019-12-07 DIAGNOSIS — E785 Hyperlipidemia, unspecified: Secondary | ICD-10-CM

## 2019-12-07 DIAGNOSIS — I10 Essential (primary) hypertension: Secondary | ICD-10-CM

## 2019-12-07 DIAGNOSIS — I480 Paroxysmal atrial fibrillation: Secondary | ICD-10-CM

## 2019-12-07 DIAGNOSIS — G4733 Obstructive sleep apnea (adult) (pediatric): Secondary | ICD-10-CM

## 2019-12-07 NOTE — Patient Instructions (Addendum)
Special Instructions: PLEASE READ SLEEP INFORMATION ATTACHED  PLEASE READ AFIB DIRECTIONS ATTACHED  PLEASE READ AND FOLLOW SALTY 6 ATTACHED  Reduce your risk of getting COVID-19 With your heart disease it is especially important for people at increased risk of severe illness from COVID-19, and those who live with them, to protect themselves from getting COVID-19. The best way to protect yourself and to help reduce the spread of the virus that causes COVID-19 is to:  Limit your interactions with other people as much as possible.  Take precautions to prevent getting COVID-19 when you do interact with others. If you start feeling sick and think you may have COVID-19, get in touch with your healthcare provider within 24 hours.  Follow-Up: 3 months  Either In Person or Virtual You may see Pixie Casino, MD or one of the following Advanced Practice Providers on your designated Care Team:  [name], PA-C  Fabian Sharp, PA-C or [place], [place].    At [facility], you and your health needs are our priority.  As part of our continuing mission to provide you with exceptional heart care, we have created designated Provider Care Teams.  These Care Teams include your primary Cardiologist (physician) and Advanced Practice Providers (APPs -  Physician Assistants and Nurse Practitioners) who all work together to provide you with the care you need, when you need it.  Thank you for choosing [facility] at [facility]!!        Quality Sleep Information, Adult Quality sleep is important for your mental and physical health. It also improves your quality of life. Quality sleep means you:  Are asleep for most of the time you are in bed.  Fall asleep within 30 minutes.  Wake up no more than once a night.  Are awake for no longer than 20 minutes if you do wake up during the night. Most adults need 7-8 hours of quality sleep each night. How can poor sleep affect me? If you do not get enough  quality sleep, you may have:  Mood swings.  Daytime sleepiness.  Confusion.  Decreased reaction time.  Sleep disorders, such as insomnia and sleep apnea.  Difficulty with: ? Solving problems. ? Coping with stress. ? Paying attention. These issues may affect your performance and productivity at work, school, and at home. Lack of sleep may also put you at higher risk for accidents, suicide, and risky behaviors. If you do not get quality sleep you may also be at higher risk for several health problems, including:  Infections.  Type 2 diabetes.  Heart disease.  High blood pressure.  Obesity.  Worsening of long-term conditions, like arthritis, kidney disease, depression, Parkinson's disease, and epilepsy. What actions can I take to get more quality sleep?      Stick to a sleep schedule. Go to sleep and wake up at about the same time each day. Do not try to sleep less on weekdays and make up for lost sleep on weekends. This does not work.  Try to get about 30 minutes of exercise on most days. Do not exercise 2-3 hours before going to bed.  Limit naps during the day to 30 minutes or less.  Do not use any products that contain nicotine or tobacco, such as cigarettes or e-cigarettes. If you need help quitting, ask your health care provider.  Do not drink caffeinated beverages for at least 8 hours before going to bed. Coffee, tea, and some sodas contain caffeine.  Do not drink alcohol  close to bedtime.  Do not eat large meals close to bedtime.  Do not take naps in the late afternoon.  Try to get at least 30 minutes of sunlight every day. Morning sunlight is best.  Make time to relax before bed. Reading, listening to music, or taking a hot bath promotes quality sleep.  Make your bedroom a place that promotes quality sleep. Keep your bedroom dark, quiet, and at a comfortable room temperature. Make sure your bed is comfortable. Take out sleep distractions like TV, a  computer, smartphone, and bright lights.  If you are lying awake in bed for longer than 20 minutes, get up and do a relaxing activity until you feel sleepy.  Work with your health care provider to treat medical conditions that may affect sleeping, such as: ? Nasal obstruction. ? Snoring. ? Sleep apnea and other sleep disorders.  Talk to your health care provider if you think any of your prescription medicines may cause you to have difficulty falling or staying asleep.  If you have sleep problems, talk with a sleep consultant. If you think you have a sleep disorder, talk with your health care provider about getting evaluated by a specialist. Where to find more information  [place] website: https://sleepfoundation.org  National Heart, Lung, and [place] ([place]): http://www.saunders.info/.pdf  Centers for Disease Control and Prevention (CDC): LearningDermatology.pl Contact a health care provider if you:  Have trouble getting to sleep or staying asleep.  Often wake up very early in the morning and cannot get back to sleep.  Have daytime sleepiness.  Have daytime sleep attacks of suddenly falling asleep and sudden muscle weakness (narcolepsy).  Have a tingling sensation in your legs with a strong urge to move your legs (restless legs syndrome).  Stop breathing briefly during sleep (sleep apnea).  Think you have a sleep disorder or are taking a medicine that is affecting your quality of sleep. Summary  Most adults need 7-8 hours of quality sleep each night.  Getting enough quality sleep is an important part of health and well-being.  Make your bedroom a place that promotes quality sleep and avoid things that may cause you to have poor sleep, such as alcohol, caffeine, smoking, and large meals.  Talk to your health care provider if you have trouble falling asleep or staying asleep. This information is not intended to  replace advice given to you by your health care provider. Make sure you discuss any questions you have with your health care provider.   Atrial Fibrillation  Atrial fibrillation is a type of heartbeat that is irregular or fast. If you have this condition, your heart beats without any order. This makes it hard for your heart to pump blood in a normal way. Atrial fibrillation may come and go, or it may become a long-lasting problem. If this condition is not treated, it can put you at higher risk for stroke, heart failure, and other heart problems. What are the causes? This condition may be caused by diseases that damage the heart. They include:  High blood pressure.  Heart failure.  Heart valve disease.  Heart surgery. Other causes include:  Diabetes.  Thyroid disease.  Being overweight.  Kidney disease. Sometimes the cause is not known. What increases the risk? You are more likely to develop this condition if:  You are older.  You smoke.  You exercise often and very hard.  You have a family history of this condition.  You are a man.  You use  drugs.  You drink a lot of alcohol.  You have lung conditions, such as emphysema, pneumonia, or COPD.  You have sleep apnea. What are the signs or symptoms? Common symptoms of this condition include:  A feeling that your heart is beating very fast.  Chest pain or discomfort.  Feeling short of breath.  Suddenly feeling light-headed or weak.  Getting tired easily during activity.  Fainting.  Sweating. In some cases, there are no symptoms. How is this treated? Treatment for this condition depends on underlying conditions and how you feel when you have atrial fibrillation. They include:  Medicines to: ? Prevent blood clots. ? Treat heart rate or heart rhythm problems.  Using devices, such as a pacemaker, to correct heart rhythm problems.  Doing surgery to remove the part of the heart that sends bad  signals.  Closing an area where clots can form in the heart (left atrial appendage). In some cases, your doctor will treat other underlying conditions. Follow these instructions at home: Medicines  Take over-the-counter and prescription medicines only as told by your doctor.  Do not take any new medicines without first talking to your doctor.  If you are taking blood thinners: ? Talk with your doctor before you take any medicines that have aspirin or NSAIDs, such as ibuprofen, in them. ? Take your medicine exactly as told by your doctor. Take it at the same time each day. ? Avoid activities that could hurt or bruise you. Follow instructions about how to prevent falls. ? Wear a bracelet that says you are taking blood thinners. Or, carry a card that lists what medicines you take. Lifestyle      Do not use any products that have nicotine or tobacco in them. These include cigarettes, e-cigarettes, and chewing tobacco. If you need help quitting, ask your doctor.  Eat heart-healthy foods. Talk with your doctor about the right eating plan for you.  Exercise regularly as told by your doctor.  Do not drink alcohol.  Lose weight if you are overweight.  Do not use drugs, including cannabis. General instructions  If you have a condition that causes breathing to stop for a short period of time (apnea), treat it as told by your doctor.  Keep a healthy weight. Do not use diet pills unless your doctor says they are safe for you. Diet pills may make heart problems worse.  Keep all follow-up visits as told by your doctor. This is important. Contact a doctor if:  You notice a change in the speed, rhythm, or strength of your heartbeat.  You are taking a blood-thinning medicine and you get more bruising.  You get tired more easily when you move or exercise.  You have a sudden change in weight. Get help right away if:   You have pain in your chest or your belly (abdomen).  You have  trouble breathing.  You have side effects of blood thinners, such as blood in your vomit, poop (stool), or pee (urine), or bleeding that cannot stop.  You have any signs of a stroke. "BE FAST" is an easy way to remember the main warning signs: ? B - Balance. Signs are dizziness, sudden trouble walking, or loss of balance. ? E - Eyes. Signs are trouble seeing or a change in how you see. ? F - Face. Signs are sudden weakness or loss of feeling in the face, or the face or eyelid drooping on one side. ? A - Arms. Signs are weakness or loss  of feeling in an arm. This happens suddenly and usually on one side of the body. ? S - Speech. Signs are sudden trouble speaking, slurred speech, or trouble understanding what people say. ? T - Time. Time to call emergency services. Write down what time symptoms started.  You have other signs of a stroke, such as: ? A sudden, very bad headache with no known cause. ? Feeling like you may vomit (nausea). ? Vomiting. ? A seizure. These symptoms may be an emergency. Do not wait to see if the symptoms will go away. Get medical help right away. Call your local emergency services (911 in the [place] Do not drive yourself to the hospital. Summary  Atrial fibrillation is a type of heartbeat that is irregular or fast.  You are at higher risk of this condition if you smoke, are older, have diabetes, or are overweight.  Follow your doctor's instructions about medicines, diet, exercise, and follow-up visits.  Get help right away if you have signs or symptoms of a stroke.  Get help right away if you cannot catch your breath, or you have chest pain or discomfort. This information is not intended to replace advice given to you by your health care provider. Make sure you discuss any questions you have with your health care provider.  ->>>>

## 2019-12-08 ENCOUNTER — Ambulatory Visit: Payer: Medicare Other | Attending: Internal Medicine

## 2019-12-08 DIAGNOSIS — Z23 Encounter for immunization: Secondary | ICD-10-CM | POA: Insufficient documentation

## 2019-12-08 NOTE — Progress Notes (Signed)
   Covid-19 Vaccination Clinic  Name:  Kirstine Wisler    MRN: [id number] DOB: May 08, 1933  12/08/2019  Ms. Metter was observed post Covid-19 immunization for 15 minutes without incidence. She was provided with Vaccine Information Sheet and instruction to access the V-Safe system.   Ms. Sporn was instructed to call 911 with any severe reactions post vaccine: Marland Kitchen Difficulty breathing  . Swelling of your face and throat  . A fast heartbeat  . A bad rash all over your body  . Dizziness and weakness    Immunizations Administered    Name Date Dose VIS Date Route   Pfizer COVID-19 Vaccine 12/08/2019 11:19 AM 0.3 mL 10/14/2019 Intramuscular   Manufacturer: [place]   Lot: [id number]   [place]: [id number]

## 2019-12-19 ENCOUNTER — Ambulatory Visit: Payer: Medicare Other

## 2019-12-21 NOTE — Telephone Encounter (Signed)
Croitoru, Mihai, MD  You 37 minutes ago (12:21 PM)   That heart rate is very slow. I would advise stopping the diltiazem, continue the amiodarone. She has a f/u w EP NP in a couple of weeks.   Message text

## 2019-12-26 ENCOUNTER — Ambulatory Visit: Payer: Medicare Other | Admitting: Adult Health

## 2019-12-27 ENCOUNTER — Telehealth: Payer: Self-pay | Admitting: Internal Medicine

## 2019-12-27 NOTE — Telephone Encounter (Signed)
New message:    Patient calling stating that she just hit her hand and she is Eliquis and would like to know what are the symtoms of a problem she could be starting to have. Please call patient.

## 2019-12-27 NOTE — Telephone Encounter (Signed)
Spoke to patient. She states she bumped her head this morning on  Clothes dryer- she states she did not pass out or fall. No bruising noted or  Large knot ( larger than half dollar) .  patient wanted to know what to do.patient states she can barely fell anthing at the site other than slightly tender.  RN to go to emergency  Room- if - swelling in the affected area , pass ou or dizziness, change in mood, disoriented. Patient states at present time she is living at a friend spare apartment.  RN recommend patient to contact daughter and friend and let them know what happen so that can call her periodically. She verbalized understanding.

## 2020-01-02 ENCOUNTER — Ambulatory Visit: Payer: Medicare Other | Attending: Internal Medicine

## 2020-01-02 DIAGNOSIS — Z23 Encounter for immunization: Secondary | ICD-10-CM | POA: Insufficient documentation

## 2020-01-02 NOTE — Progress Notes (Signed)
   Covid-19 Vaccination Clinic  Name:  Holly Benson    MRN: [id number] DOB: 06-Jun-1933  01/02/2020  [name] was observed post Covid-19 immunization for 15 minutes without incidence. She was provided with Vaccine Information Sheet and instruction to access the V-Safe system.   [name] was instructed to call 911 with any severe reactions post vaccine: Marland Kitchen Difficulty breathing  . Swelling of your face and throat  . A fast heartbeat  . A bad rash all over your body  . Dizziness and weakness    Immunizations Administered    Name Date Dose VIS Date Route   Pfizer COVID-19 Vaccine 01/02/2020 11:13 AM 0.3 mL 10/14/2019 Intramuscular   Manufacturer: [place]   Lot: [id number]   [place]: [id number]

## 2020-01-05 NOTE — Progress Notes (Signed)
Cardiology Office Note Date:  01/09/2020  Patient ID:  Holly Benson, Holly Benson [birth year], MRN [id number] PCP:  Michael Boston, MD  Cardiologist:  Dr. Debara Pickett Electrophysiologist: Dr. Curt Bears    Chief Complaint: planned EP follow up, bradycardia  History of Present Illness: Holly Benson is a 86 y.o. female with history of HTN, HLD, mixed CM, OSA w/CPAP, COPD, PAfib, flutter  She comes in today to be seen for Dr. Curt Bears, last seen by him 10/2019, at that time she c/o some fatigue, palpitations particularly mornings, and her amiodarone increased 200mg  BID for a month then back to daily  More recently saw J. Cleaver, NP 12/07/19, his note lays out quite well her past few months of AFib, symptoms, and medication progression, mostly adjusting rate control drugs 2/2 bradycardia.  At his visit she was was in [place], noted pattern to her palpitations mainly mornings and sporadically in the evenings.  Discussed triggers, importance of sleep/CPAP use and minimizing ETOH, etc.     A number of subsequent my chart notes, noted after a massage she felt unusually fatigued, weak, noting HR 40's, her dilt was stopped, amiodarone continued, planned to keep her appt here.  She comes in today all in all not feeling great. She feels like she is still having some AFib, aware of the palpitations and her AFib really sucks the energy out of her.  These episodes are typically fairly brief, usually in the mornings, lasting 10 minutes to at most a hour, once she had an episode that lasted several hours (the day after her covid vaccine. She denies any near syncope or syncope.  Has had a couple episodes when standig that she felt like she might need to sit.  These not necessarily new for her.  She denies any CP, no bleeding or signs of bleeding with her Eliquis  She did stop the diltiazem though resumed it only 3 days later because her BP was high 170's/90's She is not sure that she appreciated any obvious difference the 3 days  off it.  She brings a log, writing often bieif dizzy spells,some days more then others, some with reports of HR 40's others 60's Some days she feels quite good as well.  She said she woke this AM "as ussual" feeling fatigued, weak in general, but currently feels good.      AFib Hx Unclear when diagnosed, known diagnoses for her when she moved her from [place] in 2018 AAD hx: Flecainide started Sep 2020 > despite converting to SR she had increased SOB on drug and was stopped. Amiodarone started Oct 2020 >> current   Past Medical History:  Diagnosis Date  . Arrhythmia   . Arthritis   . Atrial fibrillation ([place])   . COPD (chronic obstructive pulmonary disease) ([place])   . Hyperlipidemia   . Hypertension   . Sleep apnea     Past Surgical History:  Procedure Laterality Date  . ABDOMINAL HYSTERECTOMY    . arm surgery Right    Broken arm and has a plate in it  . CATARACT EXTRACTION Bilateral   . COLONOSCOPY     More than 10 years ago In [facility]  . KNEE ARTHROSCOPY Right     Current Outpatient Medications  Medication Sig Dispense Refill  . albuterol (PROVENTIL HFA;VENTOLIN HFA) 108 (90 Base) MCG/ACT inhaler Inhale 2 puffs into the lungs every 4 (four) hours as needed for wheezing or shortness of breath. 1 Inhaler 3  . AMBULATORY NON FORMULARY MEDICATION 3-4 tablets  daily. Medication Name: Colon Clear    . amiodarone (PACERONE) 200 MG tablet Take 1 tablet (200 mg total) by mouth daily. 90 tablet 3  . apixaban (ELIQUIS) 5 MG TABS tablet Take 1 tablet (5 mg total) by mouth 2 (two) times daily. 180 tablet 2  . cetirizine (ZYRTEC) 10 MG tablet Take 10 mg by mouth daily.    . Chlorpheniramine Maleate (ALLERGY PO) Take by mouth daily. Medication Name: allerchord A-20 drops daily-stated it is for allergies    . clonazePAM (KLONOPIN) 0.5 MG tablet Take 0.5 tablets by mouth as needed.    . Coenzyme Q10 (CO Q-10 PO) Take 1 tablet by mouth daily.    . famotidine (PEPCID) 20 MG tablet Take  1 tablet (20 mg total) by mouth at bedtime. (Patient not taking: Reported on 12/07/2019) 30 tablet 3  . hydrochlorothiazide (MICROZIDE) 12.5 MG capsule TAKE ONE CAPSULE BY MOUTH DAILY 90 capsule 3  . linaclotide (LINZESS) 290 MCG CAPS capsule Take 1 capsule (290 mcg total) by mouth daily before breakfast. 30 capsule 3  . losartan (COZAAR) 25 MG tablet Take 1 tablet (25 mg total) by mouth daily. 30 tablet 6  . pravastatin (PRAVACHOL) 40 MG tablet Take 40 mg by mouth daily.    Marland Kitchen QUERCETIN PO Take 500 mg by mouth daily.    Marland Kitchen STIOLTO RESPIMAT 2.5-2.5 MCG/ACT AERS USE 2 INHALATIONS ORALLY   EVERY MORNING 12 g 2  . VITAMIN D PO Take 1 tablet by mouth daily.     No current facility-administered medications for this visit.    Allergies:   Lovenox [enoxaparin sodium], Lovenox [enoxaparin sodium], Amlodipine, and Metoprolol   Social History:  The patient  reports that she quit smoking about 28 years ago. She has a 45.00 pack-year smoking history. She has never used smokeless tobacco. She reports current alcohol use. She reports that she does not use drugs.   Family History:  The patient's family history includes Stroke in her maternal grandfather; Suicidality in her father.  ROS:  Please see the history of present illness.    All other systems are reviewed and otherwise negative.   PHYSICAL EXAM:  VS:  BP (!) 134/58   Pulse (!) 50   Ht 5\' 2"  (1.575 m)   Wt 141 lb (64 kg)   BMI 25.79 kg/m  BMI: Body mass index is 25.79 kg/m. Well nourished, well developed, in no acute distress  HEENT: normocephalic, atraumatic  Neck: no JVD, carotid bruits or masses Cardiac:  RRR; no significant murmurs, no rubs, or gallops Lungs:  CTA b/l, no wheezing, rhonchi or rales  Abd: soft, nontender MS: no deformity, age appropriate atrophy Ext: no edema  Skin: warm and dry, no rash Neuro:  No gross deficits appreciated Psych: euthymic mood, full affect    EKG/rhythm strip Done today and reviewed by myself  shows  SB, junctional escape beats, rate 50's overall, I do not appreciate heart block   Echocardiogram 11/18/2017 Study Conclusions  - Left ventricle: The cavity size was normal. Wall thickness was  increased in a pattern of mild LVH. Systolic function was normal.  The estimated ejection fraction was in the range of 60% to 65%.  Wall motion was normal; there were no regional wall motion  abnormalities. The study is not technically sufficient to allow  evaluation of LV diastolic function.  - Mitral valve: Mildly thickened leaflets . There was trivial  regurgitation.  - Left atrium: Moderately dilated.  - Right ventricle: The cavity  size was mildly dilated.  - Right atrium: Moderately dilated.  - Tricuspid valve: There was moderate regurgitation.  - Pulmonary arteries: PA peak pressure: 51 mm Hg (S).  - Inferior vena cava: The vessel was dilated. The respirophasic  diameter changes were blunted (< 50%), consistent with elevated  central venous pressure.  Cardiac event monitor 03/10/2018 Sinus Bradycardia / Sinus Rhythm / Sinus Arrhythmia/ Sinus Tachycardia, with intermittent First Degree AV Block, ? Junctional Rhythm and 2 pauses over 2 seconds - this appears to be 2nd degree type II AV block. PVC's, and PAC's noted. Ventricular Couplets, Trigeminy. Atrial Pairs, Runs, Trigeminy. Diary entries correlate with PVC's, and PAC's. No atrial flutter was noted.  Recent Labs: 08/24/2019: ALT 18; BUN 20; Creatinine, Ser 0.89; Potassium 4.0; Sodium 136; TSH 1.860  No results found for requested labs within last 8760 hours.   CrCl cannot be calculated (Patient's most recent lab result is older than the maximum 21 days allowed.).   Wt Readings from Last 3 Encounters:  01/09/20 141 lb (64 kg)  12/07/19 140 lb 9.6 oz (63.8 kg)  10/12/19 147 lb (66.7 kg)     Other studies reviewed: Additional studies/records reviewed today include: summarized above  ASSESSMENT AND PLAN:  1.  Paroxysmal AFib/flutter is also mentioned     CHA2DS2Vasc is 4, on Eliquis appropriately dosed for weight/creat      2. HTN      Stop diltiazem Start losartan 25mg  daily  Discussed tachy-brady and rational for possible need for pacer, though would like to repeat her monitor to get a better sense of her symptoms in relation to her rhythm, get a Zio AT and have her see Dr. Curt Bears in 3 weeks, sooner if needed     Disposition: as above, sooner if needed    Current medicines are reviewed at length with the patient today.  The patient did not have any concerns regarding medicines.  Venetia Night, PA-C 01/09/2020 10:23 AM     [place] [address] [phone] (office)  [phone] (fax)

## 2020-01-06 ENCOUNTER — Ambulatory Visit: Payer: Medicare Other | Admitting: General Practice

## 2020-01-07 ENCOUNTER — Other Ambulatory Visit: Payer: Self-pay | Admitting: Physician Assistant

## 2020-01-09 ENCOUNTER — Ambulatory Visit (INDEPENDENT_AMBULATORY_CARE_PROVIDER_SITE_OTHER): Payer: Medicare Other | Admitting: Physician Assistant

## 2020-01-09 ENCOUNTER — Encounter: Payer: Self-pay | Admitting: *Deleted

## 2020-01-09 ENCOUNTER — Other Ambulatory Visit: Payer: Self-pay

## 2020-01-09 VITALS — BP 134/58 | HR 50 | Ht 62.0 in | Wt 141.0 lb

## 2020-01-09 DIAGNOSIS — I4819 Other persistent atrial fibrillation: Secondary | ICD-10-CM | POA: Diagnosis not present

## 2020-01-09 DIAGNOSIS — R002 Palpitations: Secondary | ICD-10-CM

## 2020-01-09 DIAGNOSIS — R42 Dizziness and giddiness: Secondary | ICD-10-CM

## 2020-01-09 DIAGNOSIS — I10 Essential (primary) hypertension: Secondary | ICD-10-CM | POA: Diagnosis not present

## 2020-01-09 DIAGNOSIS — I429 Cardiomyopathy, unspecified: Secondary | ICD-10-CM | POA: Diagnosis not present

## 2020-01-09 MED ORDER — LOSARTAN POTASSIUM 25 MG PO TABS: 25.0000 mg | ORAL_TABLET | Freq: Every day | ORAL | 6 refills | Status: DC

## 2020-01-09 NOTE — Addendum Note (Signed)
Addended by: Claude Manges on: 01/09/2020 12:16 PM   Modules accepted: Orders

## 2020-01-09 NOTE — Progress Notes (Signed)
Patient ID: Holly Benson, female   DOB: Nov 23, 1932, 86 y.o.   MRN: [id number] Patient enrolled for a 14 day ZIO AT long term monitor-Live telemetry to be mailed to her home.  Instructions sent via My Chart message.

## 2020-01-09 NOTE — Patient Instructions (Addendum)
Medication Instructions:   START TAKING  LOSARTAN 25 MG ONCE A DAY   STOP TAKING  DILTIAZEM  300 MG ONCE A DAY   *If you need a refill on your cardiac medications before your next appointment, please call your pharmacy*   Lab Work:  NONE ORDERED  TODAY    If you have labs (blood work) drawn today and your tests are completely normal, you will receive your results only by: Marland Kitchen MyChart Message (if you have MyChart) OR . A paper copy in the mail If you have any lab test that is abnormal or we need to change your treatment, we will call you to review the results.   Testing/Procedures: Your physician has recommended that you wear an event monitor. Event monitors are medical devices that record the heart's electrical activity. Doctors most often [place] these monitors to diagnose arrhythmias. Arrhythmias are problems with the speed or rhythm of the heartbeat. The monitor is a small, portable device. You can wear one while you do your normal daily activities. This is usually used to diagnose what is causing palpitations/syncope (passing out).   Follow-Up: At [facility], you and your health needs are our priority.  As part of our continuing mission to provide you with exceptional heart care, we have created designated Provider Care Teams.  These Care Teams include your primary Cardiologist (physician) and Advanced Practice Providers (APPs -  Physician Assistants and Nurse Practitioners) who all work together to provide you with the care you need, when you need it.  We recommend signing up for the patient portal called "MyChart".  Sign up information is provided on this After Visit Summary.  MyChart is used to connect with patients for Virtual Visits (Telemedicine).  Patients are able to view lab/test results, encounter notes, upcoming appointments, etc.  Non-urgent messages can be sent to your provider as well.   To learn more about what you can do with MyChart, go to [email or web address].     Your next appointment:    3 week(s)  The format for your next appointment:   In Person  Provider:   You may see [name], MD   ONLY  OR WITH URSUY  ON DAY CAMNITZ IN OFFICE    Other Instructions  CALL IF BLOOD PRESSURE IS 150/90 REGULARY   ZIO XT- Long Term Monitor Instructions   Your physician has requested you wear your ZIO patch monitor___14____days.   This is a single patch monitor.  [employer] supplies one patch monitor per enrollment.  Additional stickers are not available.   Please do not apply patch if you will be having a Nuclear Stress Test, Echocardiogram, Cardiac CT, MRI, or Chest Xray during the time frame you would be wearing the monitor. The patch cannot be worn during these tests.  You cannot remove and re-apply the ZIO XT patch monitor.   Your ZIO patch monitor will be sent USPS Priority mail from [facility] directly to your home address. The monitor may also be mailed to a PO BOX if home delivery is not available.   It may take 3-5 days to receive your monitor after you have been enrolled.   Once you have received you monitor, please review enclosed instructions.  Your monitor has already been registered assigning a specific monitor serial # to you.   Applying the monitor   Shave hair from upper left chest.   Hold abrader disc by orange tab.  Rub abrader in 40 strokes over left upper chest as  indicated in your monitor instructions.   Clean area with 4 enclosed alcohol pads .  Use all pads to assure are is cleaned thoroughly.  Let dry.   Apply patch as indicated in monitor instructions.  Patch will be place under collarbone on left side of chest with arrow pointing upward.   Rub patch adhesive wings for 2 minutes.Remove white label marked "1".  Remove white label marked "2".  Rub patch adhesive wings for 2 additional minutes.   While looking in a mirror, press and release button in center of patch.  A small green light will flash 3-4 times  .  This will be your only indicator the monitor has been turned on.     Do not shower for the first 24 hours.  You may shower after the first 24 hours.   Press button if you feel a symptom. You will hear a small click.  Record Date, Time and Symptom in the Patient Log Book.   When you are ready to remove patch, follow instructions on last 2 pages of Patient Log Book.  Stick patch monitor onto last page of Patient Log Book.   Place Patient Log Book in [place] box.  Use locking tab on box and tape box closed securely.  The [employer] has [employer] on it.  Please place in mailbox as soon as possible.  Your physician should have your test results approximately 7 days after the monitor has been mailed back to [facility].   Call [place] at [phone] if you have questions regarding your ZIO XT patch monitor.  Call them immediately if you see an orange light blinking on your monitor.   If your monitor falls off in less than 4 days contact our Monitor department at [phone].  If your monitor becomes loose or falls off after 4 days call [employer] at [phone] for suggestions on securing your monitor.

## 2020-01-13 ENCOUNTER — Encounter: Payer: Self-pay | Admitting: Cardiology

## 2020-01-13 ENCOUNTER — Encounter (INDEPENDENT_AMBULATORY_CARE_PROVIDER_SITE_OTHER): Payer: Medicare Other

## 2020-01-13 DIAGNOSIS — I429 Cardiomyopathy, unspecified: Secondary | ICD-10-CM | POA: Diagnosis not present

## 2020-01-13 DIAGNOSIS — R002 Palpitations: Secondary | ICD-10-CM

## 2020-01-13 DIAGNOSIS — R42 Dizziness and giddiness: Secondary | ICD-10-CM | POA: Diagnosis not present

## 2020-01-14 DIAGNOSIS — R42 Dizziness and giddiness: Secondary | ICD-10-CM | POA: Diagnosis not present

## 2020-01-14 DIAGNOSIS — I429 Cardiomyopathy, unspecified: Secondary | ICD-10-CM | POA: Diagnosis not present

## 2020-01-14 DIAGNOSIS — I495 Sick sinus syndrome: Secondary | ICD-10-CM | POA: Diagnosis not present

## 2020-01-14 DIAGNOSIS — R002 Palpitations: Secondary | ICD-10-CM | POA: Diagnosis not present

## 2020-01-16 ENCOUNTER — Telehealth: Payer: Self-pay | Admitting: Cardiology

## 2020-01-16 ENCOUNTER — Encounter: Payer: Self-pay | Admitting: Internal Medicine

## 2020-01-16 MED ORDER — LOSARTAN POTASSIUM 50 MG PO TABS: 50.0000 mg | ORAL_TABLET | Freq: Every day | ORAL | 3 refills | Status: DC

## 2020-01-16 NOTE — Telephone Encounter (Signed)
Pt c/o BP issue: STAT if pt c/o blurred vision, one-sided weakness or slurred speech  1. What are your last 5 BP readings?   210/108 172/87 194/94 140/67 159/83 160/90  2. Are you having any other symptoms (ex. Dizziness, headache, blurred vision, passed out)? Headache  3. What is your BP issue? Patient states her BP has steadily increased. She states when it became extremely elevated she was advised to increase losartan (COZAAR) 25 MG tablet medication. Please advise.

## 2020-01-16 NOTE — Telephone Encounter (Signed)
Pt advised per Dr. Debara Pickett to increase her Losartan to 50 mg daily and will keep track of her BP and call and let us know if she is not showing improvement .. pt is scheduled to see Dr. Curt Bears 02/07/20.

## 2020-01-16 NOTE — Telephone Encounter (Signed)
error 

## 2020-01-16 NOTE — Telephone Encounter (Signed)
Pt called to report her BP readings:   210/108 172/87 194/94 140/67 159/83 160/90... today 01/16/20  PT says she spoke with an on call MD last night and she took an extra Losartan 25 mg and an extra HCTZ 12.5 mg per their recommendation.   Today she only took the HCTZ 12.5 mg.. she only takes her Losartan at night.  She has had a headache with the elevated readings but denies chest pain, dizziness.   Will forward to Dr. Debara Pickett for review.    Next OV with Dr. Debara Pickett 03/16/20

## 2020-01-16 NOTE — Telephone Encounter (Signed)
Increase losartan to 50 mg daily, she was on higher dose diltiazem that was recently discontinued and the BP is high.  Dr Lemmie Evens

## 2020-01-19 ENCOUNTER — Encounter: Payer: Self-pay | Admitting: Internal Medicine

## 2020-01-19 ENCOUNTER — Other Ambulatory Visit: Payer: Self-pay

## 2020-01-19 ENCOUNTER — Ambulatory Visit (INDEPENDENT_AMBULATORY_CARE_PROVIDER_SITE_OTHER): Payer: Medicare Other | Admitting: General Practice

## 2020-01-19 ENCOUNTER — Other Ambulatory Visit: Payer: Self-pay | Admitting: Gastroenterology

## 2020-01-19 ENCOUNTER — Encounter: Payer: Self-pay | Admitting: General Practice

## 2020-01-19 VITALS — BP 144/70 | HR 76 | Temp 98.0°F | Ht 62.5 in | Wt 142.0 lb

## 2020-01-19 DIAGNOSIS — Z9989 Dependence on other enabling machines and devices: Secondary | ICD-10-CM | POA: Diagnosis not present

## 2020-01-19 DIAGNOSIS — I480 Paroxysmal atrial fibrillation: Secondary | ICD-10-CM | POA: Diagnosis not present

## 2020-01-19 DIAGNOSIS — G4733 Obstructive sleep apnea (adult) (pediatric): Secondary | ICD-10-CM | POA: Diagnosis not present

## 2020-01-19 DIAGNOSIS — I10 Essential (primary) hypertension: Secondary | ICD-10-CM

## 2020-01-19 DIAGNOSIS — E785 Hyperlipidemia, unspecified: Secondary | ICD-10-CM | POA: Diagnosis not present

## 2020-01-19 DIAGNOSIS — Z79899 Other long term (current) drug therapy: Secondary | ICD-10-CM | POA: Diagnosis not present

## 2020-01-19 MED ORDER — LOSARTAN POTASSIUM 50 MG PO TABS: 50.0000 mg | ORAL_TABLET | Freq: Two times a day (BID) | ORAL | 3 refills | Status: DC

## 2020-01-19 NOTE — Telephone Encounter (Signed)
error 

## 2020-01-19 NOTE — Telephone Encounter (Signed)
I think we need to see her in the office to work on this - with me or APP. Please have her bring a record of BP measurements and times they were taken. Also, will need to work on better medication compliance.  Dr Lemmie Evens

## 2020-01-19 NOTE — Telephone Encounter (Signed)
Patient states her BP is still high. She says she BP this morning was 200/99, and 172/87 yesterday. She also states her BP goes down in the evening and was 124/65 last night. She would like to discuss adjusting her medications again.

## 2020-01-19 NOTE — Progress Notes (Signed)
Cardiology Clinic Note   Patient Name: Holly Benson Date of Encounter: 01/19/2020  Primary Care Provider:  Michael Boston, MD Primary Cardiologist:  Pixie Casino, MD  Patient Profile    Holly Benson 86 year old female presents today for evaluation of her hypertension.  Past Medical History    Past Medical History:  Diagnosis Date  . Arrhythmia   . Arthritis   . Atrial fibrillation ([place])   . COPD (chronic obstructive pulmonary disease) ([place])   . Hyperlipidemia   . Hypertension   . Sleep apnea    Past Surgical History:  Procedure Laterality Date  . ABDOMINAL HYSTERECTOMY    . arm surgery Right    Broken arm and has a plate in it  . CATARACT EXTRACTION Bilateral   . COLONOSCOPY     More than 10 years ago In [facility]  . KNEE ARTHROSCOPY Right     Allergies  Allergies  Allergen Reactions  . Lovenox [Enoxaparin Sodium] Hives  . Lovenox [Enoxaparin Sodium] Rash    PT STATES SHE BROKE OUT IN A RASH HEAD TO TOE AND LASTED ABOUT 3 WEEKS   . Amlodipine     Muscle pain  . Metoprolol     Muscle aches    History of Present Illness    Ms. Bostic has a past medical history of essential hypertension, hyperlipidemia, obstructive sleep apnea on CPAP, COPD, and history of atrial flutter/atrial fibrillation. Previously had DCCV while she was living in [place] and has been on Eliquis since that time. She was started on flecainide by Dr. Curt Bears in September 2020, she converted to sinus rhythm however had increasing shortness of breath which improved when her flecainide was stopped. During her last EP visit 08/23/2019, she was started on 200 mg daily amiodarone. Her previous echocardiogram 11/2018 showed an EF 60-65% with peak PA pressures of 31 mmHg concerning for moderate pulmonary hypertension. It was suspected that her pulmonary hypertension was related to her OSA. She was started on CPAP therapy October 2020.  She was seen by Dr. Curt Bears 10/11/2019 and during  that visit she described   episodes of palpitations which would happen mainly in the morning. Her amiodarone was increased to 200 mg twice daily for 1 month before titrating down to 200 mg daily.  She was seen by [name], PA-C on 10/12/2019 and stated that she was still having episodes of shortness of breath in the setting of palpitations. Her palpitations have decreased over the past 2 days. At that time she was advised to avoid caffeine. She denied chest pain and lower extremity edema. Increasing her metoprolol was discussed however, she had concerns with her breathing history and higher doses of metoprolol. Her metoprolol was DC'd at that time and bisoprolol 5 mg daily was started for rate control as well as blood pressure. At that time her blood pressure was quite high and had been high for the past few days.  It was believed that anxiety was contributing to her hypertension as well.  She continued to be in normal sinus rhythm at that time.  She had good cardiac awareness and had not experienced any palpitations in the prior 2 days.  On 11/07/2019 she contacted the nurse triage line and indicated that her blood pressure was low with a heart rate of 57.  She also indicated that she had no energy and muscle weakness.  Her bisoprolol was stopped at that time.  Her metoprolol succinate 12.5 was restarted.  On 12/01/2019 she  presented to the emergency department due to an irregular rhythm.  Her EKG in the emergency room indicated that she was a normal sinus rhythm at that time.  Dr. Debara Pickett reviewed her rhythm strip showing a heart rate in the 40s which was believed to be atrial fibrillation.  Her metoprolol was stopped at that time and her amiodarone was continued.  She again contacted nurse triage line on 12/06/2019 and indicated that she had accidentally taken an extra dose of amiodarone.  Her rhythm strip was reviewed at that time which appeared to be normal sinus rhythm with a number of PACs.  She  presented to the clinic 12/07/2019 for follow-up and stated she felt better today.  She had purchased a cardiac monitor which she showed me her rhythm from this morning.  It appears to be sinus rhythm.  She had noticed a pattern with her atrial fibrillation mainly presenting in the morning and sporadically in the evening.  She noticed that when she felt badly with her atrial fibrillation her blood pressure is usually low as well.  I continued to hold metoprolol .  She is compliant with her CPAP.  She stated she did have wine nightly with dinner.  This was discussed as a possible trigger for atrial fibrillation along with caffeine, chocolate, and OSA.  She asked if a cardiac monitor would be beneficial at this time.  Given that she was on guideline therapy and in sinus rhythm  I did not order a cardiac monitor at that time.  She also indicated that she had been having trouble sleeping.  I will gave her the sleep hygiene information sheet.  She did state that she had more restful sleep  with her CPAP use.  She presented for an EP follow-up evaluation on 01/09/2020 due to sinus bradycardia.  Her diltiazem was stopped and she was started on losartan 25 mg daily.  She contacted our office on 01/16/2020 with continued elevated blood pressures and her losartan was increased to 50 mg daily.  She was noticing blood pressures in the mornings around 124/65 and in the evenings 200s-170s over 90s.  She presents the clinic today for further evaluation and states since discontinuing her diltiazem she has noticed increased blood pressures in the morning.  She has been taking her losartan 50 mg in the evening.  Her blood pressures have been ranging from [id number] systolic each morning.  She continues to be very physically active doing yoga, running errands, and doing housework.  She has tried to stay away from sodium in her diet as well.  I will increase her losartan to 50 mg twice daily because she has just refilled the medication  and has an abundant supply on hand.  If this does not better control her blood pressure I would recommend switching her to valsartan for more extended control.  Today she denies chest pain, shortness of breath, lower extremity edema, fatigue, increased palpitations, melena, hematuria, hemoptysis, diaphoresis, weakness, orthopnea, and PND.   Home Medications    Prior to Admission medications   Medication Sig Start Date End Date Taking? Authorizing Provider  albuterol (PROVENTIL HFA;VENTOLIN HFA) 108 (90 Base) MCG/ACT inhaler Inhale 2 puffs into the lungs every 4 (four) hours as needed for wheezing or shortness of breath. 01/27/19   Byrum, Rose Fillers, MD  AMBULATORY NON FORMULARY MEDICATION 3-4 tablets daily. Medication Name: Colon Clear    [provider]  amiodarone (PACERONE) 200 MG tablet Take 1 tablet (200 mg total) by mouth daily.  08/24/19   [name], PA-C  apixaban (ELIQUIS) 5 MG TABS tablet Take 1 tablet (5 mg total) by mouth 2 (two) times daily. 10/26/19   Hilty, Nadean Corwin, MD  cetirizine (ZYRTEC) 10 MG tablet Take 10 mg by mouth daily.    [provider]  Chlorpheniramine Maleate (ALLERGY PO) Take by mouth daily. Medication Name: allerchord A-20 drops daily-stated it is for allergies    [provider]  clonazePAM (KLONOPIN) 0.5 MG tablet Take 0.5 tablets by mouth as needed. 09/02/18   [provider]  Coenzyme Q10 (CO Q-10 PO) Take 1 tablet by mouth daily.    [provider]  famotidine (PEPCID) 20 MG tablet Take 1 tablet (20 mg total) by mouth at bedtime. Patient not taking: Reported on 12/07/2019 09/23/19   Mauri Pole, MD  hydrochlorothiazide (MICROZIDE) 12.5 MG capsule TAKE ONE CAPSULE BY MOUTH DAILY 01/26/19   Hilty, Nadean Corwin, MD  LINZESS 290 MCG CAPS capsule TAKE ONE CAPSULE BY MOUTH EVERY MORNING BEFORE BREAKFAST 01/19/20   Mauri Pole, MD  losartan (COZAAR) 50 MG tablet Take 1 tablet (50 mg total) by mouth  daily. 01/16/20   Hilty, Nadean Corwin, MD  pravastatin (PRAVACHOL) 40 MG tablet Take 40 mg by mouth daily.    [provider]  QUERCETIN PO Take 500 mg by mouth daily.    [provider]  STIOLTO RESPIMAT 2.5-2.5 MCG/ACT AERS USE 2 INHALATIONS ORALLY   EVERY MORNING 05/05/19   Collene Gobble, MD  VITAMIN D PO Take 1 tablet by mouth daily.    [provider]    Family History    Family History  Problem Relation Age of Onset  . Suicidality Father   . Stroke Maternal Grandfather   . Colon cancer Neg Hx   . Esophageal cancer Neg Hx    She indicated that her mother is deceased. She indicated that her father is deceased. She indicated that her maternal grandfather is deceased. She indicated that the status of her neg hx is unknown.  Social History    Social History   Socioeconomic History  . Marital status: Widowed    Spouse name: Not on file  . Number of children: Not on file  . Years of education: Not on file  . Highest education level: Not on file  Occupational History  . Not on file  Tobacco Use  . Smoking status: Former Smoker    Packs/day: 1.50    Years: 30.00    Pack years: 45.00    Quit date: 11/04/1991    Years since quitting: 28.2  . Smokeless tobacco: Never Used  Substance and Sexual Activity  . Alcohol use: Yes  . Drug use: No  . Sexual activity: Not on file  Other Topics Concern  . Not on file  Social History Narrative  . Not on file   Social Determinants of Health   Financial Resource Strain:   . Difficulty of Paying Living Expenses:   Food Insecurity:   . Worried About Charity fundraiser in the Last Year:   . Arboriculturist in the Last Year:   Transportation Needs:   . Film/video editor (Medical):   Marland Kitchen Lack of Transportation (Non-Medical):   Physical Activity:   . Days of Exercise per Week:   . Minutes of Exercise per Session:   Stress:   . Feeling of Stress :   Social Connections:   . Frequency of Communication with  Friends and Family:   . Frequency of Social Gatherings with Friends and Family:   . Attends Religious Services:   . Active Member of Clubs or Organizations:   . Attends Archivist Meetings:   Marland Kitchen Marital Status:   Intimate Partner Violence:   . Fear of Current or Ex-Partner:   . Emotionally Abused:   Marland Kitchen Physically Abused:   . Sexually Abused:      Review of Systems    General:  No chills, fever, night sweats or weight changes.  Cardiovascular:  No chest pain, dyspnea on exertion, edema, orthopnea, palpitations, paroxysmal nocturnal dyspnea. Dermatological: No rash, lesions/masses Respiratory: No cough, dyspnea Urologic: No hematuria, dysuria Abdominal:   No nausea, vomiting, diarrhea, bright red blood per rectum, melena, or hematemesis Neurologic:  No visual changes, wkns, changes in mental status. All other systems reviewed and are otherwise negative except as noted above.  Physical Exam    VS:  BP (!) 144/70 (BP Location: Left Arm, Patient Position: Sitting, Cuff Size: Normal)   Pulse 76   Temp 98 F (36.7 C)   Ht 5' 2.5" (1.588 m)   Wt 142 lb (64.4 kg)   BMI 25.56 kg/m  , BMI Body mass index is 25.56 kg/m. GEN: Well nourished, well developed, in no acute distress. HEENT: normal. Neck: Supple, no JVD, carotid bruits, or masses. Cardiac: RRR, no murmurs, rubs, or gallops. No clubbing, cyanosis, edema.  Radials/DP/PT 2+ and equal bilaterally.  Respiratory:  Respirations regular and unlabored, clear to auscultation bilaterally. GI: Soft, nontender, nondistended, BS + x 4. MS: no deformity or atrophy. Skin: warm and dry, no rash. Neuro:  Strength and sensation are intact. Psych: Normal affect.  Accessory Clinical Findings    ECG personally reviewed by me today-none today  EKG 12/07/2019 normal sinus rhythm left axis deviation incomplete right bundle branch block ST and T wave abnormality consider anterior lateral ischemia 64 bpm.  EKG 12/01/2019 Sinus rhythm  64 bpm no T wave or ST deviation  Echocardiogram 11/18/2017 Study Conclusions   - Left ventricle: The cavity size was normal. Wall thickness was  increased in a pattern of mild LVH. Systolic function was normal.  The estimated ejection fraction was in the range of 60% to 65%.  Wall motion was normal; there were no regional wall motion  abnormalities. The study is not technically sufficient to allow  evaluation of LV diastolic function.  - Mitral valve: Mildly thickened leaflets . There was trivial  regurgitation.  - Left atrium: Moderately dilated.  - Right ventricle: The cavity size was mildly dilated.  - Right atrium: Moderately dilated.  - Tricuspid valve: There was moderate regurgitation.  - Pulmonary arteries: PA peak pressure: 51 mm Hg (S).  - Inferior vena cava: The vessel was dilated. The respirophasic  diameter changes were blunted (< 50%), consistent with elevated  central venous pressure.  Cardiac event monitor 03/10/2018 Sinus Bradycardia / Sinus Rhythm / Sinus Arrhythmia/ Sinus Tachycardia, with intermittent First Degree AV Block, ? Junctional Rhythm and 2 pauses over 2 seconds - this appears to be 2nd degree type II AV block. PVC's, and PAC's noted. Ventricular Couplets, Trigeminy. Atrial Pairs, Runs, Trigeminy. Diary entries correlate with PVC's, and PAC's. No atrial flutter was noted.  Assessment & Plan   1.  Essential hypertension-BP today  144/70.    Diltiazem stopped due to bradycardia.   Continue HCTZ 12.5 mg daily Start losartan 50 mg twice daily Heart healthy low-sodium diet-salty 6 given Increase physical activity  as tolerated Continue blood pressure log Repeat BMP in 2 weeks  Paroxysmal atrial fibrillation-heart rate today 76 bpm.  She has been wearing a heart rate monitor and does not notice any episodes of atrial fibrillation. Continue amiodarone 200 mg daily Continue Eliquis 5 mg twice daily Avoid triggers caffeine, chocolate, EtOH,  sleep apnea, etc.  Followed by Dr. Curt Bears.  Hyperlipidemia-[id number] 09/12/2019 Continue pravastatin 40 mg daily Continue co-Q10 Heart healthy low-sodium high-fiber diet Increase physical activity as tolerated Repeat lipid panel   Obstructive sleep apnea-compliant with CPAP use Continue CPAP Continue weight loss Discussed benefits of continued compliance  Disposition: Follow-up with Dr. Dr. Debara Pickett 03/16/2020.  Jossie Ng. [facility] [address] Office [phone] Fax [phone]

## 2020-01-19 NOTE — Telephone Encounter (Signed)
Pt called to report that even though she increased her Losartan to 50 mg daily which she takes at bedtime... he BP has still been elevated on the morning...  Yesterday 172/87 and today 200/99.Marland Kitchen pt says she is asymptomatic... no headache. Dizziness, chets pain  At night her BP has been improved 124/65  Pt has not been checking her BP consistently..she cannot remember if the 124/65 is before or after her dose... she takes her Losartan when she thinks about it... typically when she goes to get ready for bed but can be at any time in the evening.    Will forard to Dr. Debara Pickett for added recommendations.

## 2020-01-19 NOTE — Patient Instructions (Signed)
Medication Instructions:  INCREASE LOSARTAN 50MG  TWICE DAILY If you need a refill on your cardiac medications before your next appointment, please call your pharmacy.  Labwork: FASTING LIPID PANEL AND BMET IN 2 WEEKS (02-02-2020) HERE IN OUR OFFICE AT [facility]    You will need to fast. DO NOT EAT OR DRINK PAST MIDNIGHT.       If you have labs (blood work) drawn today and your tests are completely normal, you will receive your results only by: Marland Kitchen MyChart Message (if you have MyChart) OR A paper copy in the mail If you have any lab test that is abnormal or we need to change your treatment, we will call you to review these results. You may go to any [facility] lab that is convenient for you however, we do have a lab in our office that is able to assist you. You do NOT need an appointment for our lab. Once in our office in our office lobby there is a podium where you sign-in and ring the doorbell to alert [place] that you are here. Lab is open 8:00am and closes at 4:00pm; closes for lunch from 12:45 - 1:45pm. PLEASE BRING A COPY OF YOUR INSURANCE CARD WITH YOU.  Special Instructions: TAKE AND LOG YOUR BLOOD PRESSURE DAILY-1 HOUR AFTER TAKING MEDICATION  MAINTAIN PHYSICAL ACTIVITY  PLEASE READ AND FOLLOW SALTY 6 ATTACHED  Follow-Up: KEEP SCHEDULED APPOINTMENTS  In Person K. Mali Hilty, MD.    At [facility], you and your health needs are our priority.  As part of our continuing mission to provide you with exceptional heart care, we have created designated Provider Care Teams.  These Care Teams include your primary Cardiologist (physician) and Advanced Practice Providers (APPs -  Physician Assistants and Nurse Practitioners) who all work together to provide you with the care you need, when you need it.  Reduce your risk of getting COVID-19 With your heart disease it is especially important for people at increased risk of severe illness from COVID-19, and those who live with them, to protect themselves  from getting COVID-19. The best way to protect yourself and to help reduce the spread of the virus that causes COVID-19 is to: Marland Kitchen Limit your interactions with other people as much as possible. . Take COVID-19 when you do interact with others. If you start feeling sick and think you may have COVID-19, get in touch with your healthcare provider within 24 hours.  Thank you for choosing [facility] at [facility]!!

## 2020-01-19 NOTE — Telephone Encounter (Signed)
Pt agreed to an appt this afternoon with [name] PA she is going out of town tomorrow and needed to be seen today.

## 2020-01-23 ENCOUNTER — Telehealth: Payer: Self-pay | Admitting: Internal Medicine

## 2020-01-23 NOTE — Telephone Encounter (Signed)
Returned call to patient, she states Losartan was increased to 50 mg BID last week 3/18 with NP.   Since the change her BP has continued to rise.    She denies blurred vision, dizziness, reports mild HA but states she has neck issues and this may be due to this.    Advised to give medication a week to see improvement but 2 weeks to see full effects.    Advised to take BP again today later (204/105 this AM).  If BP still in 200s as it was this AM, advised to call office.     She is aware and verbalized understanding. Will continue to monitor and record BP readings.

## 2020-01-23 NOTE — Telephone Encounter (Signed)
Pt c/o BP issue: STAT if pt c/o blurred vision, one-sided weakness or slurred speech  1. What are your last 5 BP readings?  01/20/20: 155/77 and 178/89  01/21/20: 191/96 and 140/69  01/22/20: 161/81 and 183/93  01/23/20: 204/105  2. Are you having any other symptoms (ex. Dizziness, headache, blurred vision, passed out)? Denies all symptoms.   3. What is your BP issue? BP continues to rise. Her medication for Losartan was changed on 01/19/20 to 50mg  2x daily. She takes her BP an hour after she takes this medication. She denies any symptoms listed above.

## 2020-01-24 ENCOUNTER — Telehealth: Payer: Self-pay | Admitting: *Deleted

## 2020-01-24 ENCOUNTER — Telehealth: Payer: Self-pay | Admitting: Internal Medicine

## 2020-01-24 MED ORDER — IRBESARTAN 150 MG PO TABS: 150.0000 mg | ORAL_TABLET | Freq: Every day | ORAL | 3 refills | Status: DC

## 2020-01-24 NOTE — Telephone Encounter (Signed)
IRHYTHM Rep Kalman Shan is calling to give an abnormal reading on this pt.  Per Rose, this pt had first documented aflutter episode at 9:15 pm last night.  Spoke with the pt and she states she felt like her heart was pounding off and on for 45 mins.  Not constantly she said.  Pt states she pressed the button.  Pt reports she was no more sob as usual.  Pt states she did not feel dizzy, lightheaded. Pt reports she had no pre-syncopal or syncopal episodes.  Pt states she is currently at home resting comfortably at this time. Monitor was ordered by Tommye Standard PA-C for reasons mentioned below:  ASSESSMENT AND PLAN:  1. Paroxysmal AFib/flutter is also mentioned     CHA2DS2Vasc is 4, on Eliquis appropriately dosed for weight/creat      2. HTN      Stop diltiazem Start losartan 25mg  daily  Discussed tachy-brady and rational for possible need for pacer, though would like to repeat her monitor to get a better sense of her symptoms in relation to her rhythm, get a Zio AT and have her see Dr. Curt Bears in 3 weeks, sooner if needed    Pt states she is taking all her meds as prescribed, and she states she called Dr. Lysbeth Penner office this morning to report her new med losartan is not helping her BP.  Pt already spoke with a triage nurse at our [place] office about this matter, and is awaiting a call back from the triage nurse, to receive further recommendations from  [name] NP or Dr. Debara Pickett. (see separate telephone note from today 01/24/20 at 0931 for details about this).  Did have our monitor tech print her strip off with this episode and noted on 3/22 at 9:15 pm, pt had episode of aflutter with a rate of 80-102 bpm, auto triggered.  Pt does have a history of typical aflutter and is currently on eliquis.   Informed the pt that I will go and show Dr. Curt Bears her monitor recording and follow-up with her shortly thereafter.  Pt verbalized understanding and agrees with this plan.

## 2020-01-24 NOTE — Telephone Encounter (Signed)
Levada Dy with [facility] states the patient has been prescribed irbesartan (AVAPRO) 150 MG tablet medication as opposed to Losartan medication. She states she is requesting confirmation from Dr. Lysbeth Penner nurse to ensure that Irbesartan prescription order is accurate.  Please return call to [place] at [phone] and ask for [facility].

## 2020-01-24 NOTE — Telephone Encounter (Signed)
Showed the pts monitor recording to Dr. Curt Bears. Per Dr. Curt Bears, the pt was not in aflutter at this time, she was in sinus rhythm.  Per Dr. Curt Bears, pt should continue her current medication regimen, and follow-up with Dr. Debara Pickett and [name] NP, for further BP management.    Called the Pt and informed her of Dr. Curt Bears recommendation that we will continue to monitor, and no changes to be made by him, and to follow-up with Dr. Riki Sheer NP for BP mangement.  Pt  states she just got a call back from our [place] office with further medication changes made to address her BP concerns. Pt verbalized understanding and agrees with this plan.  Monitor will be scanned into the pts chart.  Will send this message to Tommye Standard PA-C and Dr. Curt Bears RN, as a general FYI.

## 2020-01-24 NOTE — Telephone Encounter (Signed)
Follow up  Pt is calling back, she said her BP is not going down, Last night it was 197/100 then this morning her BP is at 204/100 with a little SOB but she said it's normal for her. She would like to speak with the nurse again.  Please call

## 2020-01-24 NOTE — Telephone Encounter (Signed)
Returned call to [facility] pharmacisit.  Advised new prescription for irbesartan is correct.  Irbesartan 150 mg daily.  Advised might be prudent to just give Pt a 30 day fill because there may be further changes.  Pharmacist thanked nurse for call.

## 2020-01-24 NOTE — Telephone Encounter (Signed)
Pt called to report that her BP has still been elevated.. she says since she started the Losartan her readings an hour after taking her meds have been:   164/83, 200/99, 165/77  Last night 197/100 This morning X2 the same reading 1 hour apart 200/100  She is asymptomatic. She says she has been very careful with her NA intake.   I asked if her cuff appeared to be working properly to be sure she has "fresh" batteries if she has them.   Will forward to [name] NP for review.

## 2020-01-24 NOTE — Telephone Encounter (Signed)
Deberah Pelton, NP 9:56 AM Please contact Ms. Heatherly and remind her to eat a low-sodium diet and take her blood pressure an hour after taking her blood pressure medication. Lets have her stop her losartan and start irbesartan 150 mg daily. BMET as ordered at appt 02-02-2020. You will need to fast. DO NOT EAT OR DRINK PAST MIDNIGHT.   Pt informed of providers result & recommendations. Pt verbalized understanding. No further questions . She will continue to take and log her BP daily after taking her medication. She is following a low na+ diet "as well as she can" she will continue to do so. Lmvm to have fasting lab as ordered at appointment.

## 2020-01-26 ENCOUNTER — Telehealth: Payer: Self-pay | Admitting: Internal Medicine

## 2020-01-26 NOTE — Telephone Encounter (Signed)
Spoke with pt who report even though BP medication was changed to irbesartan 150 mg on 3/23, she continues to have increased BP reading.  3/23- 204/100(AM), 143/77(PM) 3/24- 188/94 (AM), 188/94 (PM) 3/25- 201/102  Pt does report a slight dull headache over right eye but state she has been experiencing bad sinuses and doesn't know if headache could be related to that.  Will forward to NP for recommendations.

## 2020-01-26 NOTE — Telephone Encounter (Signed)
D/W JESSE IF BP CONTINUES TO BE >200 SBP AND NEURO SX VISION CHANGES, BMET BEFORE F/U NEXT WEEK SPEECH ISSUES, BALANCE ISSUES OR INTENSE HEADACHE CALL EMS APP NEXT WEEK; NO IBU OR HERBAL TEA, LICORICE OR DECAF COFFEE. OK FOR TYL  Pt informed of providers result & recommendations. Pt verbalized understanding. No further questions . HER BP ON THE PHONE NOW IS 200/109 HR 86. She will stop CAFFEINATED/decar coffee/beverages as noted above. F/u appt scheduled with KL 02-02-2020 @ 815am and have BMET on Tuesday 01-31-2020.

## 2020-01-26 NOTE — Telephone Encounter (Signed)
Pt c/o BP issue: STAT if pt c/o blurred vision, one-sided weakness or slurred speech  1. What are your last 5 BP readings?   01/24/20: 204/100  01/25/20: AM - 108/94 /   PM: 188/94  01/26/20: 201/102  2. Are you having any other symptoms (ex. Dizziness, headache, blurred vision, passed out)? Dull headache  3. What is your BP issue? Bp has been extremely elevated.

## 2020-01-27 DIAGNOSIS — Z7901 Long term (current) use of anticoagulants: Secondary | ICD-10-CM | POA: Diagnosis not present

## 2020-01-27 DIAGNOSIS — M810 Age-related osteoporosis without current pathological fracture: Secondary | ICD-10-CM | POA: Diagnosis not present

## 2020-01-27 DIAGNOSIS — I480 Paroxysmal atrial fibrillation: Secondary | ICD-10-CM | POA: Diagnosis not present

## 2020-01-27 DIAGNOSIS — Z23 Encounter for immunization: Secondary | ICD-10-CM | POA: Diagnosis not present

## 2020-01-27 DIAGNOSIS — J441 Chronic obstructive pulmonary disease with (acute) exacerbation: Secondary | ICD-10-CM | POA: Diagnosis not present

## 2020-01-27 DIAGNOSIS — E7849 Other hyperlipidemia: Secondary | ICD-10-CM | POA: Diagnosis not present

## 2020-01-27 DIAGNOSIS — M545 Low back pain: Secondary | ICD-10-CM | POA: Diagnosis not present

## 2020-01-27 DIAGNOSIS — I2729 Other secondary pulmonary hypertension: Secondary | ICD-10-CM | POA: Diagnosis not present

## 2020-01-27 DIAGNOSIS — G2581 Restless legs syndrome: Secondary | ICD-10-CM | POA: Diagnosis not present

## 2020-01-27 DIAGNOSIS — I1310 Hypertensive heart and chronic kidney disease without heart failure, with stage 1 through stage 4 chronic kidney disease, or unspecified chronic kidney disease: Secondary | ICD-10-CM | POA: Diagnosis not present

## 2020-01-27 DIAGNOSIS — D6869 Other thrombophilia: Secondary | ICD-10-CM | POA: Diagnosis not present

## 2020-01-27 DIAGNOSIS — Z1331 Encounter for screening for depression: Secondary | ICD-10-CM | POA: Diagnosis not present

## 2020-01-30 NOTE — Progress Notes (Signed)
Cardiology Office Note   Date:  02/02/2020   ID:  Holly Benson, DOB Mar 11, 1933, MRN [id number]  PCP:  Michael Boston, MD  Cardiologist: Dr. Debara Pickett  CC: Hypertension   History of Present Illness: Holly Benson is a 86 y.o. female who presents ongoing assessment and management of atrial fibrillation on Eliquis but intolerant of Flecainide causing dyspnea, but is tolerating amiodarone, hypertension, HTN, HL, Anxiety, OSA on CPAP.  She presented for an EP follow-up evaluation on 01/09/2020 due to sinus bradycardia.  Her diltiazem was stopped and she was started on losartan 25 mg daily.  She contacted our office on 01/16/2020 with continued elevated blood pressures and her losartan was increased to 50 mg daily.  She was noticing blood pressures in the mornings around 124/65 and in the evenings 200s-170s over 90s.  She was increased on losartan 50 mg BID, HCTZ was continued at 12.5 mg daily. She was continued to be mindful of low sodium diet. She was continued on amiodarone and Eliquis.   She called our office on 01/26/2020 with elevated BP > 200 with neuro changes of vision disturbances. She was to avoid caffeine and salt. She is to be seen today for BP evaluation.   She comes today with a list of her blood pressures and she has had significant elevations prior to starting losartan.  They are trending down and she is having some low blood pressures in the late afternoon.  She is also been taking higher doses of HCTZ.  She was to be on 12.5 mg daily but has gone up to 25 mg daily due to elevated blood pressure which I think is causing some of her afternoon hypotension.  Most of her blood pressure elevations are in the a.m.  She denies any rapid heart rhythm irregular heart rate or palpitations on amiodarone.  She does have a history of OSA and has been compliant with CPAP but continues to have insomnia.  She denies bleeding excessive bruising hemoptysis or epistaxis on Eliquis.  She complains of leg pain  especially in her thighs and left heel pain when she walks.  Goes away when she rests.  She thinks it is related to her back.  Past Medical History:  Diagnosis Date  . Arrhythmia   . Arthritis   . Atrial fibrillation ([place])   . COPD (chronic obstructive pulmonary disease) ([place])   . Hyperlipidemia   . Hypertension   . Sleep apnea     Past Surgical History:  Procedure Laterality Date  . ABDOMINAL HYSTERECTOMY    . arm surgery Right    Broken arm and has a plate in it  . CATARACT EXTRACTION Bilateral   . COLONOSCOPY     More than 10 years ago In [facility]  . KNEE ARTHROSCOPY Right      Current Outpatient Medications  Medication Sig Dispense Refill  . albuterol (PROVENTIL HFA;VENTOLIN HFA) 108 (90 Base) MCG/ACT inhaler Inhale 2 puffs into the lungs every 4 (four) hours as needed for wheezing or shortness of breath. 1 Inhaler 3  . AMBULATORY NON FORMULARY MEDICATION 3-4 tablets daily. Medication Name: Colon Clear    . amiodarone (PACERONE) 200 MG tablet Take 1 tablet (200 mg total) by mouth daily. 90 tablet 3  . apixaban (ELIQUIS) 5 MG TABS tablet Take 1 tablet (5 mg total) by mouth 2 (two) times daily. 180 tablet 2  . cetirizine (ZYRTEC) 10 MG tablet Take 10 mg by mouth daily.    . clonazePAM (KLONOPIN) 0.5  MG tablet Take 0.5 tablets by mouth as needed.    . Coenzyme Q10 (CO Q-10 PO) Take 1 tablet by mouth daily.    . famotidine (PEPCID) 20 MG tablet Take 1 tablet (20 mg total) by mouth at bedtime. (Patient taking differently: Take 20 mg by mouth daily as needed. ) 30 tablet 3  . hydrochlorothiazide (MICROZIDE) 12.5 MG capsule TAKE ONE CAPSULE BY MOUTH DAILY 90 capsule 3  . irbesartan (AVAPRO) 150 MG tablet Take 1 tablet (150 mg total) by mouth daily. 30 tablet 3  . LINZESS 290 MCG CAPS capsule TAKE ONE CAPSULE BY MOUTH EVERY MORNING BEFORE BREAKFAST 30 capsule 2  . pravastatin (PRAVACHOL) 40 MG tablet Take 40 mg by mouth daily.    Marland Kitchen QUERCETIN PO Take 500 mg by mouth daily.      Marland Kitchen STIOLTO RESPIMAT 2.5-2.5 MCG/ACT AERS USE 2 INHALATIONS ORALLY   EVERY MORNING 12 g 2  . VITAMIN D PO Take 1 tablet by mouth daily.    Marland Kitchen amLODipine (NORVASC) 2.5 MG tablet Take 1 tablet (2.5 mg total) by mouth at bedtime. 90 tablet 3   No current facility-administered medications for this visit.    Dr. H&P by [place] did the intervention allergies:   Lovenox [enoxaparin sodium], Lovenox [enoxaparin sodium], Amlodipine, and Metoprolol    Social History:  The patient  reports that she quit smoking about 28 years ago. She has a 45.00 pack-year smoking history. She has never used smokeless tobacco. She reports current alcohol use. She reports that she does not use drugs.   Family History:  The patient's family history includes Stroke in her maternal grandfather; Suicidality in her father.    ROS: All other systems are reviewed and negative. Unless otherwise mentioned in H&P    PHYSICAL EXAM: VS:  BP (!) 168/84   Pulse 75   Ht 5' 2.5" (1.588 m)   Wt 142 lb 6.4 oz (64.6 kg)   SpO2 96%   BMI 25.63 kg/m  , BMI Body mass index is 25.63 kg/m. GEN: Well nourished, well developed, in no acute distress HEENT: normal Neck: no JVD, right carotid bruits, or masses Cardiac: RRR; no murmurs, rubs, or gallops,no edema .  Bilateral femoral bruits right greater than left diminished pulses distally. Respiratory:  Clear to auscultation bilaterally, normal work of breathing GI: soft, nontender, nondistended, + BS, ventral hernia noted MS: no deformity or atrophy Skin: warm and dry, no rash Neuro:  Strength and sensation are intact Psych: euthymic mood, full affect   EKG: Not completed this office visit Recent Labs: 08/24/2019: ALT 18; TSH 1.860 02/01/2020: BUN 25; Creatinine, Ser 1.11; Potassium 4.4; Sodium 137    Lipid Panel    Component Value Date/Time   CHOL 196 02/01/2020 0822   TRIG 66 02/01/2020 0822   HDL 86 02/01/2020 0822   CHOLHDL 2.3 02/01/2020 0822   LDLCALC 98 02/01/2020  0822      Wt Readings from Last 3 Encounters:  02/02/20 142 lb 6.4 oz (64.6 kg)  01/19/20 142 lb (64.4 kg)  01/09/20 141 lb (64 kg)      Other studies Reviewed: Echocardiogram  Left ventricle: The cavity size was normal. Wall thickness was  increased in a pattern of mild LVH. Systolic function was normal.  The estimated ejection fraction was in the range of 60% to 65%.  Wall motion was normal; there were no regional wall motion  abnormalities. The study is not technically sufficient to allow  evaluation of LV diastolic  function.  - Mitral valve: Mildly thickened leaflets . There was trivial  regurgitation.  - Left atrium: Moderately dilated.  - Right ventricle: The cavity size was mildly dilated.  - Right atrium: Moderately dilated.  - Tricuspid valve: There was moderate regurgitation.  - Pulmonary arteries: PA peak pressure: 51 mm Hg (S).  - Inferior vena cava: The vessel was dilated. The respirophasic  diameter changes were blunted (< 50%), consistent with elevated  central venous pressure.   ASSESSMENT AND PLAN:  1.  Hypertension: Not well controlled but improved with use of irbesartan.  She is having significant elevations of blood pressure in a.m. I am going to add a very low-dose of amlodipine 2.5 mg at at bedtime, she will continue irbesartan 150 in a.m., and reduce her HCTZ back to 12.5 mg.  She will continue to take her blood pressure daily and record.  If she experiences any dizziness or significant hypotension she is to call us.  2  Bilateral femoral bruits: Harsh bruit noted in the right proximal femoral artery at the groin, less prominent bruit noted on the left.  She has complaints of thigh pain and leg pain similar to intermittent claudication but more pain in her gluteal and thighs bilaterally with walking.  Pulses are somewhat diminished.  She will have bilateral ABIs, with segmental Doppler ultrasound, she will also have abdominal ultrasound with  iliac and aortic ultrasound.  Referral to Dr. Gwenlyn Found.  3.  OSA: Compliant with CPAP but does still have some insomnia.  Will try melatonin at low-dose of 5 mg daily.  4.  Atypical atrial flutter: Remains on amiodarone and apixaban. CHADS VASC Score of .    Current medicines are reviewed at length with the patient today.  I have spent 45 dedicated to the care of this patient on the date of this encounter to include pre-visit review of records, assessment, management and diagnostic testing,with shared decision making.  Labs/ tests ordered today include: Bilateral ABIs with segmental Doppler, aortic and iliac ultrasound.  Phill Myron. West Pugh, ANP, [facility]   02/02/2020 9:34 AM    [facility] [place] [address] Office [phone] Fax [phone]  Notice: This dictation was prepared with Dragon dictation along with smaller phrase technology. Any transcriptional errors that result from this process are unintentional and may not be corrected upon review.

## 2020-02-01 DIAGNOSIS — Z9989 Dependence on other enabling machines and devices: Secondary | ICD-10-CM | POA: Diagnosis not present

## 2020-02-01 DIAGNOSIS — G4733 Obstructive sleep apnea (adult) (pediatric): Secondary | ICD-10-CM | POA: Diagnosis not present

## 2020-02-01 DIAGNOSIS — I10 Essential (primary) hypertension: Secondary | ICD-10-CM | POA: Diagnosis not present

## 2020-02-01 DIAGNOSIS — Z79899 Other long term (current) drug therapy: Secondary | ICD-10-CM | POA: Diagnosis not present

## 2020-02-01 DIAGNOSIS — E785 Hyperlipidemia, unspecified: Secondary | ICD-10-CM | POA: Diagnosis not present

## 2020-02-01 DIAGNOSIS — I480 Paroxysmal atrial fibrillation: Secondary | ICD-10-CM | POA: Diagnosis not present

## 2020-02-01 LAB — LIPID PANEL
Chol/HDL Ratio: 2.3 ratio (ref 0.0–4.4)
Cholesterol, Total: 196 mg/dL (ref 100–199)
HDL: 86 mg/dL (ref >39)
LDL Chol Calc (NIH): 98 mg/dL (ref 0–99)
Triglycerides: 66 mg/dL (ref 0–149)
VLDL Cholesterol Cal: 12 mg/dL (ref 5–40)

## 2020-02-01 LAB — BASIC METABOLIC PANEL
BUN/Creatinine Ratio: 23 (ref 12–28)
BUN: 25 mg/dL (ref 8–27)
CO2: 25 mmol/L (ref 20–29)
Calcium: 10.2 mg/dL (ref 8.7–10.3)
Chloride: 96 mmol/L (ref 96–106)
Creatinine, Ser: 1.11 mg/dL — ABNORMAL HIGH (ref 0.57–1.00)
GFR calc Af Amer: 53 mL/min/{1.73_m2} — ABNORMAL LOW (ref >59)
GFR calc non Af Amer: 46 mL/min/{1.73_m2} — ABNORMAL LOW (ref >59)
Glucose: 87 mg/dL (ref 65–99)
Potassium: 4.4 mmol/L (ref 3.5–5.2)
Sodium: 137 mmol/L (ref 134–144)

## 2020-02-02 ENCOUNTER — Encounter: Payer: Self-pay | Admitting: Adult Health

## 2020-02-02 ENCOUNTER — Other Ambulatory Visit: Payer: Self-pay

## 2020-02-02 ENCOUNTER — Ambulatory Visit (INDEPENDENT_AMBULATORY_CARE_PROVIDER_SITE_OTHER): Payer: Medicare Other | Admitting: Adult Health

## 2020-02-02 VITALS — BP 168/84 | HR 75 | Ht 62.5 in | Wt 142.4 lb

## 2020-02-02 DIAGNOSIS — Z9989 Dependence on other enabling machines and devices: Secondary | ICD-10-CM | POA: Diagnosis not present

## 2020-02-02 DIAGNOSIS — G4733 Obstructive sleep apnea (adult) (pediatric): Secondary | ICD-10-CM

## 2020-02-02 DIAGNOSIS — M79605 Pain in left leg: Secondary | ICD-10-CM

## 2020-02-02 DIAGNOSIS — R0989 Other specified symptoms and signs involving the circulatory and respiratory systems: Secondary | ICD-10-CM | POA: Diagnosis not present

## 2020-02-02 DIAGNOSIS — M79604 Pain in right leg: Secondary | ICD-10-CM

## 2020-02-02 DIAGNOSIS — I483 Typical atrial flutter: Secondary | ICD-10-CM

## 2020-02-02 DIAGNOSIS — I10 Essential (primary) hypertension: Secondary | ICD-10-CM | POA: Diagnosis not present

## 2020-02-02 MED ORDER — AMLODIPINE BESYLATE 2.5 MG PO TABS: 2.5000 mg | ORAL_TABLET | Freq: Every day | ORAL | 3 refills | Status: DC

## 2020-02-02 NOTE — Patient Instructions (Signed)
Medication Instructions:  START- Amlodipine 2.5 mg by mouth at bedtime  *If you need a refill on your cardiac medications before your next appointment, please call your pharmacy*   Lab Work: None Ordered   Testing/Procedures: Your physician has requested that you have a lower  extremity arterial duplex. This test is an ultrasound of the arteries in the legs or arms. It looks at arterial blood flow in the legs and arms. Allow one hour for Lower and Upper Arterial scans. There are no restrictions or special instructions   Follow-Up: At [facility], you and your health needs are our priority.  As part of our continuing mission to provide you with exceptional heart care, we have created designated Provider Care Teams.  These Care Teams include your primary Cardiologist (physician) and Advanced Practice Providers (APPs -  Physician Assistants and Nurse Practitioners) who all work together to provide you with the care you need, when you need it.  We recommend signing up for the patient portal called "MyChart".  Sign up information is provided on this After Visit Summary.  MyChart is used to connect with patients for Virtual Visits (Telemedicine).  Patients are able to view lab/test results, encounter notes, upcoming appointments, etc.  Non-urgent messages can be sent to your provider as well.   To learn more about what you can do with MyChart, go to [email or web address].    Your next appointment:   Keep appointment with Dr Debara Pickett on May 14th @ 1:30 pm  The format for your next appointment:   In Person    You have been referred to Dr Gwenlyn Found in 1 Month

## 2020-02-06 ENCOUNTER — Other Ambulatory Visit: Payer: Self-pay | Admitting: Adult Health

## 2020-02-06 DIAGNOSIS — M79605 Pain in left leg: Secondary | ICD-10-CM

## 2020-02-06 DIAGNOSIS — I739 Peripheral vascular disease, unspecified: Secondary | ICD-10-CM

## 2020-02-06 DIAGNOSIS — M79604 Pain in right leg: Secondary | ICD-10-CM

## 2020-02-06 DIAGNOSIS — R0989 Other specified symptoms and signs involving the circulatory and respiratory systems: Secondary | ICD-10-CM

## 2020-02-07 ENCOUNTER — Other Ambulatory Visit: Payer: Self-pay

## 2020-02-07 ENCOUNTER — Encounter: Payer: Self-pay | Admitting: Cardiology

## 2020-02-07 ENCOUNTER — Ambulatory Visit (INDEPENDENT_AMBULATORY_CARE_PROVIDER_SITE_OTHER): Payer: Medicare Other | Admitting: Cardiology

## 2020-02-07 VITALS — BP 170/86 | HR 74 | Ht 62.0 in | Wt 139.0 lb

## 2020-02-07 DIAGNOSIS — I483 Typical atrial flutter: Secondary | ICD-10-CM | POA: Diagnosis not present

## 2020-02-07 MED ORDER — AMLODIPINE BESYLATE 5 MG PO TABS: 5.0000 mg | ORAL_TABLET | Freq: Every day | ORAL | 6 refills | Status: DC

## 2020-02-07 NOTE — Patient Instructions (Signed)
Medication Instructions:  Your physician has recommended you make the following change in your medication:  1. INCREASE Amlodipine (Norvasc) to 5 mg once daily  *If you need a refill on your cardiac medications before your next appointment, please call your pharmacy*   Lab Work: None ordered   Testing/Procedures: None ordered   Follow-Up: At [facility], you and your health needs are our priority.  As part of our continuing mission to provide you with exceptional heart care, we have created designated Provider Care Teams.  These Care Teams include your primary Cardiologist (physician) and Advanced Practice Providers (APPs -  Physician Assistants and Nurse Practitioners) who all work together to provide you with the care you need, when you need it.  We recommend signing up for the patient portal called "MyChart".  Sign up information is provided on this After Visit Summary.  MyChart is used to connect with patients for Virtual Visits (Telemedicine).  Patients are able to view lab/test results, encounter notes, upcoming appointments, etc.  Non-urgent messages can be sent to your provider as well.   To learn more about what you can do with MyChart, go to [email or web address].    Your next appointment:   6 month(s)  The format for your next appointment:   In Person  Provider:   You may see [name], MD or one of the following Advanced Practice Providers on your designated Care Team:    Chanetta Marshall, NP  Tommye Standard, PA-C  Legrand Como "[place]" [place], [place]    Thank you for choosing [facility]!!   Trinidad Curet, RN [phone]    Other Instructions

## 2020-02-07 NOTE — Progress Notes (Signed)
Electrophysiology Office Note   Date:  02/07/2020   ID:  Holly Benson, DOB 04-10-1933, MRN [id number]  PCP:  Michael Boston, MD  Cardiologist:  Debara Pickett Primary Electrophysiologist:  Aquiles Ruffini Meredith Leeds, MD    Chief Complaint: AF/flutter   History of Present Illness: Holly Benson is a 86 y.o. female who is being seen today for the evaluation of atrial flutter at the request of [name], Jesse Sans, MD. Presenting today for electrophysiology evaluation.  She has a history of hypertension, atrial fibrillation/flutter status post cardioversion, hyperlipidemia, OSA on CPAP, and COPD.  She has had issues with shortness of breath and fatigue as well as spikes in her blood pressure.  She was having episodes of fatigue and shortness of breath.  It was thought that this was due to atrial flutter or atrial fibrillation.  Today, denies symptoms of palpitations, chest pain, shortness of breath, orthopnea, PND, lower extremity edema, claudication, dizziness, presyncope, syncope, bleeding, or neurologic sequela. The patient is tolerating medications without difficulties.  He is overall doing well.  She stopped her diltiazem and her atrial fibrillation improved.  Since then, she has had no further episodes.  She wore a cardiac monitor which showed sinus rhythm and no other arrhythmias.   Past Medical History:  Diagnosis Date  . Arrhythmia   . Arthritis   . Atrial fibrillation ([place])   . COPD (chronic obstructive pulmonary disease) ([place])   . Hyperlipidemia   . Hypertension   . Sleep apnea    Past Surgical History:  Procedure Laterality Date  . ABDOMINAL HYSTERECTOMY    . arm surgery Right    Broken arm and has a plate in it  . CATARACT EXTRACTION Bilateral   . COLONOSCOPY     More than 10 years ago In [facility]  . KNEE ARTHROSCOPY Right      Current Outpatient Medications  Medication Sig Dispense Refill  . albuterol (PROVENTIL HFA;VENTOLIN HFA) 108 (90 Base) MCG/ACT inhaler Inhale 2 puffs  into the lungs every 4 (four) hours as needed for wheezing or shortness of breath. 1 Inhaler 3  . AMBULATORY NON FORMULARY MEDICATION 3-4 tablets daily. Medication Name: Colon Clear    . amiodarone (PACERONE) 200 MG tablet Take 1 tablet (200 mg total) by mouth daily. 90 tablet 3  . amLODipine (NORVASC) 2.5 MG tablet Take 1 tablet (2.5 mg total) by mouth at bedtime. 90 tablet 3  . apixaban (ELIQUIS) 5 MG TABS tablet Take 1 tablet (5 mg total) by mouth 2 (two) times daily. 180 tablet 2  . cetirizine (ZYRTEC) 10 MG tablet Take 10 mg by mouth daily.    . clonazePAM (KLONOPIN) 0.5 MG tablet Take 0.5 tablets by mouth as needed.    . Coenzyme Q10 (CO Q-10 PO) Take 1 tablet by mouth daily.    . hydrochlorothiazide (MICROZIDE) 12.5 MG capsule TAKE ONE CAPSULE BY MOUTH DAILY 90 capsule 3  . irbesartan (AVAPRO) 150 MG tablet Take 1 tablet (150 mg total) by mouth daily. 30 tablet 3  . LINZESS 290 MCG CAPS capsule TAKE ONE CAPSULE BY MOUTH EVERY MORNING BEFORE BREAKFAST 30 capsule 2  . pravastatin (PRAVACHOL) 40 MG tablet Take 40 mg by mouth daily.    Marland Kitchen QUERCETIN PO Take 500 mg by mouth daily.    Marland Kitchen STIOLTO RESPIMAT 2.5-2.5 MCG/ACT AERS USE 2 INHALATIONS ORALLY   EVERY MORNING 12 g 2  . VITAMIN D PO Take 1 tablet by mouth daily.     No current facility-administered medications  for this visit.    Allergies:   Lovenox [enoxaparin sodium], Lovenox [enoxaparin sodium], Amlodipine, and Metoprolol   Social History:  The patient  reports that she quit smoking about 28 years ago. She has a 45.00 pack-year smoking history. She has never used smokeless tobacco. She reports current alcohol use. She reports that she does not use drugs.   Family History:  The patient's family history includes Stroke in her maternal grandfather; Suicidality in her father.    ROS:  Please see the history of present illness.   Otherwise, review of systems is positive for none.   All other systems are reviewed and negative.   PHYSICAL  EXAM: VS:  BP (!) 170/86   Pulse 74   Ht 5\' 2"  (1.575 m)   Wt 139 lb (63 kg)   SpO2 96%   BMI 25.42 kg/m  , BMI Body mass index is 25.42 kg/m. GEN: Well nourished, well developed, in no acute distress  HEENT: normal  Neck: no JVD, carotid bruits, or masses Cardiac: RRR; no murmurs, rubs, or gallops,no edema  Respiratory:  clear to auscultation bilaterally, normal work of breathing GI: soft, nontender, nondistended, + BS MS: no deformity or atrophy  Skin: warm and dry Neuro:  Strength and sensation are intact Psych: euthymic mood, full affect  EKG:  EKG is ordered today. Personal review of the ekg ordered shows sinus rhythm, rate 74  Recent Labs: 08/24/2019: ALT 18; TSH 1.860 02/01/2020: BUN 25; Creatinine, Ser 1.11; Potassium 4.4; Sodium 137    Lipid Panel     Component Value Date/Time   CHOL 196 02/01/2020 0822   TRIG 66 02/01/2020 0822   HDL 86 02/01/2020 0822   CHOLHDL 2.3 02/01/2020 0822   LDLCALC 98 02/01/2020 0822     Wt Readings from Last 3 Encounters:  02/07/20 139 lb (63 kg)  02/02/20 142 lb 6.4 oz (64.6 kg)  01/19/20 142 lb (64.4 kg)      Other studies Reviewed: Additional studies/ records that were reviewed today include: TTE 11/18/18  Review of the above records today demonstrates:  - Left ventricle: The cavity size was normal. Wall thickness was   increased in a pattern of mild LVH. Systolic function was normal.   The estimated ejection fraction was in the range of 60% to 65%.   Wall motion was normal; there were no regional wall motion   abnormalities. The study is not technically sufficient to allow   evaluation of LV diastolic function. - Mitral valve: Mildly thickened leaflets . There was trivial   regurgitation. - Left atrium: Moderately dilated. - Right ventricle: The cavity size was mildly dilated. - Right atrium: Moderately dilated. - Tricuspid valve: There was moderate regurgitation. - Pulmonary arteries: PA peak pressure: 51 mm Hg  (S). - Inferior vena cava: The vessel was dilated. The respirophasic   diameter changes were blunted (< 50%), consistent with elevated   central venous pressure.  Cardiac monitor 01/22/2020 personally reviewed Max 96 bpm 09:03am, 03/18 Min 48 bpm 04:29am, 03/20 Avg 66 bpm Less than 1% PACs and PVCs No atrial fibrillation noted Predominant rhythm was sinus rhythm Symptoms associated with sinus rhythm  ASSESSMENT AND PLAN:  1.  Typical atrial fibrillation/flutter: Currently on Eliquis, diltiazem, metoprolol, amiodarone.  CHA2DS2-VASc of 4.  Cardiac monitor shows no evidence of atrial fibrillation.  We Saragrace Selke continue with current management.   2. OSA: CPAP compliance encouraged  3.  Hypertension: Blood pressure is significantly elevated today.  Denene Alamillo increase Norvasc to  5 mg and allow further adjustment by her primary physician. Current medicines are reviewed at length with the patient today.    The patient does not have concerns regarding her medicines.  The following changes were made today: Increase Norvasc  Labs/ tests ordered today include:  Orders Placed This Encounter  Procedures  . EKG 12-Lead     Disposition:   FU with Janey Petron 6 months  Signed, Brandom Kerwin Meredith Leeds, MD  02/07/2020 3:35 PM     [address] [place] [address] [phone] (office) [phone] (fax)

## 2020-02-14 MED ORDER — HYDROCHLOROTHIAZIDE 12.5 MG PO CAPS: 12.5000 mg | ORAL_CAPSULE | Freq: Every day | ORAL | 3 refills | Status: DC

## 2020-02-14 MED ORDER — PRAVASTATIN SODIUM 40 MG PO TABS: 40.0000 mg | ORAL_TABLET | Freq: Every day | ORAL | 3 refills | Status: DC

## 2020-02-20 ENCOUNTER — Other Ambulatory Visit: Payer: Self-pay

## 2020-02-20 ENCOUNTER — Ambulatory Visit (HOSPITAL_BASED_OUTPATIENT_CLINIC_OR_DEPARTMENT_OTHER)
Admission: RE | Admit: 2020-02-20 | Discharge: 2020-02-20 | Disposition: A | Discharge status: Home / Self Care | Payer: Medicare Other | Source: Ambulatory Visit | Attending: Adult Health | Admitting: Adult Health

## 2020-02-20 ENCOUNTER — Ambulatory Visit (HOSPITAL_COMMUNITY)
Admission: RE | Admit: 2020-02-20 | Discharge: 2020-02-20 | Disposition: A | Discharge status: Home / Self Care | Payer: Medicare Other | Source: Ambulatory Visit | Attending: Cardiovascular Disease | Admitting: Cardiovascular Disease

## 2020-02-20 DIAGNOSIS — R0989 Other specified symptoms and signs involving the circulatory and respiratory systems: Secondary | ICD-10-CM

## 2020-02-20 DIAGNOSIS — I739 Peripheral vascular disease, unspecified: Secondary | ICD-10-CM | POA: Diagnosis not present

## 2020-02-20 DIAGNOSIS — M79604 Pain in right leg: Secondary | ICD-10-CM | POA: Diagnosis not present

## 2020-02-20 DIAGNOSIS — M79605 Pain in left leg: Secondary | ICD-10-CM | POA: Insufficient documentation

## 2020-02-21 ENCOUNTER — Encounter: Payer: Self-pay | Admitting: Cardiovascular Disease

## 2020-02-21 ENCOUNTER — Ambulatory Visit (INDEPENDENT_AMBULATORY_CARE_PROVIDER_SITE_OTHER): Payer: Medicare Other | Admitting: Cardiovascular Disease

## 2020-02-21 VITALS — BP 116/68 | HR 75 | Ht 62.5 in | Wt 141.4 lb

## 2020-02-21 DIAGNOSIS — M79605 Pain in left leg: Secondary | ICD-10-CM

## 2020-02-21 DIAGNOSIS — I739 Peripheral vascular disease, unspecified: Secondary | ICD-10-CM

## 2020-02-21 DIAGNOSIS — E785 Hyperlipidemia, unspecified: Secondary | ICD-10-CM | POA: Diagnosis not present

## 2020-02-21 DIAGNOSIS — M79604 Pain in right leg: Secondary | ICD-10-CM | POA: Diagnosis not present

## 2020-02-21 DIAGNOSIS — I10 Essential (primary) hypertension: Secondary | ICD-10-CM | POA: Diagnosis not present

## 2020-02-21 DIAGNOSIS — Z01812 Encounter for preprocedural laboratory examination: Secondary | ICD-10-CM | POA: Diagnosis not present

## 2020-02-21 DIAGNOSIS — I4819 Other persistent atrial fibrillation: Secondary | ICD-10-CM | POA: Diagnosis not present

## 2020-02-21 NOTE — Patient Instructions (Addendum)
    [place] [address] Dept: [phone] Loc: [place]  02/21/2020  You are scheduled for a PV Cath on Wednesday 03/22/19  with Dr.Arida.  1. Please arrive at the [facility] (Main Entrance A) at [facility]: [address] [place], [place] [address] at 6:30 am (This time is two hours before your procedure to ensure your preparation). Free valet parking service is available.   Special note: Every effort is made to have your procedure done on time. Please understand that emergencies sometimes delay scheduled procedures.  2. Diet: Do not eat solid foods after midnight.  The patient may have clear liquids until 5am upon the day of the procedure.  3. Labs: You will need to have blood drawn on Friday 03/17/19 You do not need to be fasting. Covid Test Friday 5/14 at 2:15 pm at [facility] site under brick awning After test you will need to quarantine until after procedure  4. Medication instructions in preparation for your procedure:        Hold Eliquis 2 days before   ( 5/17 )       Hold HCTZ morning of procedure        On the morning of your procedure, take Aspirin 81 mg  and any morning medicines NOT listed above.  You may use sips of water.  5. Plan for one night stay--bring personal belongings. 6. Bring a current list of your medications and current insurance cards. 7. You MUST have a responsible person to drive you home. 8. Someone MUST be with you the first 24 hours after you arrive home or your discharge will be delayed. 9. Please wear clothes that are easy to get on and off and wear slip-on shoes.  Thank you for allowing [place] to care for you!   -- [facility]

## 2020-02-21 NOTE — Progress Notes (Signed)
Cardiology Office Note   Date:  02/21/2020   ID:  Holly Benson, DOB Jan 05, 1933, MRN [id number]  PCP:  Michael Boston, MD  Cardiologist:  Dr. Debara Pickett  No chief complaint on file.     History of Present Illness: Holly Benson is a 86 y.o. female who was referred by Vilinda Flake for evaluation management of peripheral arterial disease.  She has known history of atrial fibrillation on long-term anticoagulation with Eliquis, essential hypertension, hyperlipidemia, anxiety and obstructive sleep apnea on CPAP. She reports progressive bilateral leg pain and lower back pain that started about 2 to 3 years ago and has been gradually worsening.  It starts in the lower back and goes to her thighs.  Symptoms have progressed per her happening after she walks about 100 stop.  She has to stop and rest for few minutes before she can resume.  She has no rest pain or lower extremity ulceration.  She underwent noninvasive vascular studies yesterday which showed an ABI of 0.88 on the right and 0.69 on the left.  Duplex showed monophasic waveforms starting in the common femoral artery with no evidence of infrainguinal disease.  Aortoiliac duplex showed severe bilateral external iliac artery stenosis and significant left common iliac artery stenosis.  She denies any chest pain or palpitations.  She has stable exertional dyspnea. She is a previous smoker and has no history of diabetes  Past Medical History:  Diagnosis Date  . Arrhythmia   . Arthritis   . Atrial fibrillation ([place])   . COPD (chronic obstructive pulmonary disease) ([place])   . Hyperlipidemia   . Hypertension   . Sleep apnea     Past Surgical History:  Procedure Laterality Date  . ABDOMINAL HYSTERECTOMY    . arm surgery Right    Broken arm and has a plate in it  . CATARACT EXTRACTION Bilateral   . COLONOSCOPY     More than 10 years ago In [facility]  . KNEE ARTHROSCOPY Right      Current Outpatient Medications  Medication  Sig Dispense Refill  . albuterol (PROVENTIL HFA;VENTOLIN HFA) 108 (90 Base) MCG/ACT inhaler Inhale 2 puffs into the lungs every 4 (four) hours as needed for wheezing or shortness of breath. 1 Inhaler 3  . AMBULATORY NON FORMULARY MEDICATION 3-4 tablets daily. Medication Name: Colon Clear    . amiodarone (PACERONE) 200 MG tablet Take 1 tablet (200 mg total) by mouth daily. 90 tablet 3  . amLODipine (NORVASC) 5 MG tablet Take 1 tablet (5 mg total) by mouth daily. 30 tablet 6  . apixaban (ELIQUIS) 5 MG TABS tablet Take 1 tablet (5 mg total) by mouth 2 (two) times daily. 180 tablet 2  . cetirizine (ZYRTEC) 10 MG tablet Take 10 mg by mouth daily.    . clonazePAM (KLONOPIN) 0.5 MG tablet Take 0.5 tablets by mouth as needed.    . Coenzyme Q10 (CO Q-10 PO) Take 1 tablet by mouth daily.    . hydrochlorothiazide (MICROZIDE) 12.5 MG capsule Take 1 capsule (12.5 mg total) by mouth daily. 90 capsule 3  . irbesartan (AVAPRO) 150 MG tablet Take 1 tablet (150 mg total) by mouth daily. 30 tablet 3  . LINZESS 290 MCG CAPS capsule TAKE ONE CAPSULE BY MOUTH EVERY MORNING BEFORE BREAKFAST 30 capsule 2  . pravastatin (PRAVACHOL) 40 MG tablet Take 1 tablet (40 mg total) by mouth daily. 90 tablet 3  . QUERCETIN PO Take 500 mg by mouth daily.    Marland Kitchen  STIOLTO RESPIMAT 2.5-2.5 MCG/ACT AERS USE 2 INHALATIONS ORALLY   EVERY MORNING 12 g 2  . VITAMIN D PO Take 1 tablet by mouth daily.     No current facility-administered medications for this visit.    Allergies:   Lovenox [enoxaparin sodium], Lovenox [enoxaparin sodium], and Metoprolol    Social History:  The patient  reports that she quit smoking about 28 years ago. She has a 45.00 pack-year smoking history. She has never used smokeless tobacco. She reports current alcohol use. She reports that she does not use drugs.   Family History:  The patient's family history includes Stroke in her maternal grandfather; Suicidality in her father.    ROS:  Please see the history of  present illness.   Otherwise, review of systems are positive for none.   All other systems are reviewed and negative.    PHYSICAL EXAM: VS:  BP 116/68   Pulse 75   Ht 5' 2.5" (1.588 m)   Wt 141 lb 6.4 oz (64.1 kg)   SpO2 94%   BMI 25.45 kg/m  , BMI Body mass index is 25.45 kg/m. GEN: Well nourished, well developed, in no acute distress  HEENT: normal  Neck: no JVD, carotid bruits, or masses Cardiac: RRR; no murmurs, rubs, or gallops,no edema  Respiratory:  clear to auscultation bilaterally, normal work of breathing GI: soft, nontender, nondistended, + BS MS: no deformity or atrophy  Skin: warm and dry, no rash Neuro:  Strength and sensation are intact Psych: euthymic mood, full affect Vascular: Femoral pulses +1 bilaterally.  Distal pulses are not palpable   EKG:  EKG is not ordered today.   Recent Labs: 08/24/2019: ALT 18; TSH 1.860 02/01/2020: BUN 25; Creatinine, Ser 1.11; Potassium 4.4; Sodium 137    Lipid Panel    Component Value Date/Time   CHOL 196 02/01/2020 0822   TRIG 66 02/01/2020 0822   HDL 86 02/01/2020 0822   CHOLHDL 2.3 02/01/2020 0822   LDLCALC 98 02/01/2020 0822      Wt Readings from Last 3 Encounters:  02/21/20 141 lb 6.4 oz (64.1 kg)  02/07/20 139 lb (63 kg)  02/02/20 142 lb 6.4 oz (64.6 kg)        PAD Screen 09/10/2017  Previous PAD dx? No  Previous surgical procedure? Yes  Dates of procedures arm 2014  Pain with walking? Yes  Subsides with rest? Yes  Feet/toe relief with dangling? No  Painful, non-healing ulcers? No  Extremities discolored? No      ASSESSMENT AND PLAN:  1.  Peripheral arterial disease with severe bilateral leg claudication (Rutherford class III): This is due to severe bilateral iliac disease worse on the left side.  Given progressive symptoms and the fact that her symptoms are lifestyle limiting, I have recommended proceeding with abdominal aortogram with lower extremity runoff and possible endovascular  intervention.  I discussed the procedure in details as well as risks and benefits. Planned access is via the left common femoral artery in order to be able to treat proximal left common iliac stenosis. Hold Eliquis 2 days before surgery.  2.  Atrial fibrillation: Maintaining in sinus rhythm with amiodarone and she is on anticoagulation with Eliquis.  3.  Essential hypertension: Blood pressure is controlled on current medications.  4.  Hyperlipidemia: Currently on pravastatin 40 mg daily.  Most recent lipid profile showed an LDL of 98.  Given her recent diagnosis of peripheral arterial disease, we should consider a more potent statin and achieving an  LDL below 70.    Disposition:   FU with me 2 weeks after the procedure.  Signed,  Kathlyn Sacramento, MD  02/21/2020 9:47 AM    [facility]

## 2020-02-21 NOTE — H&P (View-Only) (Signed)
Cardiology Office Note   Date:  02/21/2020   ID:  Holly Benson, DOB 1932/11/22, MRN [id number]  PCP:  Michael Boston, MD  Cardiologist:  Dr. Debara Pickett  No chief complaint on file.     History of Present Illness: Holly Benson is a 86 y.o. female who was referred by Holly Benson for evaluation management of peripheral arterial disease.  She has known history of atrial fibrillation on long-term anticoagulation with Eliquis, essential hypertension, hyperlipidemia, anxiety and obstructive sleep apnea on CPAP. She reports progressive bilateral leg pain and lower back pain that started about 2 to 3 years ago and has been gradually worsening.  It starts in the lower back and goes to her thighs.  Symptoms have progressed per her happening after she walks about 100 stop.  She has to stop and rest for few minutes before she can resume.  She has no rest pain or lower extremity ulceration.  She underwent noninvasive vascular studies yesterday which showed an ABI of 0.88 on the right and 0.69 on the left.  Duplex showed monophasic waveforms starting in the common femoral artery with no evidence of infrainguinal disease.  Aortoiliac duplex showed severe bilateral external iliac artery stenosis and significant left common iliac artery stenosis.  She denies any chest pain or palpitations.  She has stable exertional dyspnea. She is a previous smoker and has no history of diabetes  Past Medical History:  Diagnosis Date  . Arrhythmia   . Arthritis   . Atrial fibrillation ([place])   . COPD (chronic obstructive pulmonary disease) ([place])   . Hyperlipidemia   . Hypertension   . Sleep apnea     Past Surgical History:  Procedure Laterality Date  . ABDOMINAL HYSTERECTOMY    . arm surgery Right    Broken arm and has a plate in it  . CATARACT EXTRACTION Bilateral   . COLONOSCOPY     More than 10 years ago In [facility]  . KNEE ARTHROSCOPY Right      Current Outpatient Medications  Medication  Sig Dispense Refill  . albuterol (PROVENTIL HFA;VENTOLIN HFA) 108 (90 Base) MCG/ACT inhaler Inhale 2 puffs into the lungs every 4 (four) hours as needed for wheezing or shortness of breath. 1 Inhaler 3  . AMBULATORY NON FORMULARY MEDICATION 3-4 tablets daily. Medication Name: Colon Clear    . amiodarone (PACERONE) 200 MG tablet Take 1 tablet (200 mg total) by mouth daily. 90 tablet 3  . amLODipine (NORVASC) 5 MG tablet Take 1 tablet (5 mg total) by mouth daily. 30 tablet 6  . apixaban (ELIQUIS) 5 MG TABS tablet Take 1 tablet (5 mg total) by mouth 2 (two) times daily. 180 tablet 2  . cetirizine (ZYRTEC) 10 MG tablet Take 10 mg by mouth daily.    . clonazePAM (KLONOPIN) 0.5 MG tablet Take 0.5 tablets by mouth as needed.    . Coenzyme Q10 (CO Q-10 PO) Take 1 tablet by mouth daily.    . hydrochlorothiazide (MICROZIDE) 12.5 MG capsule Take 1 capsule (12.5 mg total) by mouth daily. 90 capsule 3  . irbesartan (AVAPRO) 150 MG tablet Take 1 tablet (150 mg total) by mouth daily. 30 tablet 3  . LINZESS 290 MCG CAPS capsule TAKE ONE CAPSULE BY MOUTH EVERY MORNING BEFORE BREAKFAST 30 capsule 2  . pravastatin (PRAVACHOL) 40 MG tablet Take 1 tablet (40 mg total) by mouth daily. 90 tablet 3  . QUERCETIN PO Take 500 mg by mouth daily.    Marland Kitchen  STIOLTO RESPIMAT 2.5-2.5 MCG/ACT AERS USE 2 INHALATIONS ORALLY   EVERY MORNING 12 g 2  . VITAMIN D PO Take 1 tablet by mouth daily.     No current facility-administered medications for this visit.    Allergies:   Lovenox [enoxaparin sodium], Lovenox [enoxaparin sodium], and Metoprolol    Social History:  The patient  reports that she quit smoking about 28 years ago. She has a 45.00 pack-year smoking history. She has never used smokeless tobacco. She reports current alcohol use. She reports that she does not use drugs.   Family History:  The patient's family history includes Stroke in her maternal grandfather; Suicidality in her father.    ROS:  Please see the history of  present illness.   Otherwise, review of systems are positive for none.   All other systems are reviewed and negative.    PHYSICAL EXAM: VS:  BP 116/68   Pulse 75   Ht 5' 2.5" (1.588 m)   Wt 141 lb 6.4 oz (64.1 kg)   SpO2 94%   BMI 25.45 kg/m  , BMI Body mass index is 25.45 kg/m. GEN: Well nourished, well developed, in no acute distress  HEENT: normal  Neck: no JVD, carotid bruits, or masses Cardiac: RRR; no murmurs, rubs, or gallops,no edema  Respiratory:  clear to auscultation bilaterally, normal work of breathing GI: soft, nontender, nondistended, + BS MS: no deformity or atrophy  Skin: warm and dry, no rash Neuro:  Strength and sensation are intact Psych: euthymic mood, full affect Vascular: Femoral pulses +1 bilaterally.  Distal pulses are not palpable   EKG:  EKG is not ordered today.   Recent Labs: 08/24/2019: ALT 18; TSH 1.860 02/01/2020: BUN 25; Creatinine, Ser 1.11; Potassium 4.4; Sodium 137    Lipid Panel    Component Value Date/Time   CHOL 196 02/01/2020 0822   TRIG 66 02/01/2020 0822   HDL 86 02/01/2020 0822   CHOLHDL 2.3 02/01/2020 0822   LDLCALC 98 02/01/2020 0822      Wt Readings from Last 3 Encounters:  02/21/20 141 lb 6.4 oz (64.1 kg)  02/07/20 139 lb (63 kg)  02/02/20 142 lb 6.4 oz (64.6 kg)        PAD Screen 09/10/2017  Previous PAD dx? No  Previous surgical procedure? Yes  Dates of procedures arm 2014  Pain with walking? Yes  Subsides with rest? Yes  Feet/toe relief with dangling? No  Painful, non-healing ulcers? No  Extremities discolored? No      ASSESSMENT AND PLAN:  1.  Peripheral arterial disease with severe bilateral leg claudication (Rutherford class III): This is due to severe bilateral iliac disease worse on the left side.  Given progressive symptoms and the fact that her symptoms are lifestyle limiting, I have recommended proceeding with abdominal aortogram with lower extremity runoff and possible endovascular  intervention.  I discussed the procedure in details as well as risks and benefits. Planned access is via the left common femoral artery in order to be able to treat proximal left common iliac stenosis. Hold Eliquis 2 days before surgery.  2.  Atrial fibrillation: Maintaining in sinus rhythm with amiodarone and she is on anticoagulation with Eliquis.  3.  Essential hypertension: Blood pressure is controlled on current medications.  4.  Hyperlipidemia: Currently on pravastatin 40 mg daily.  Most recent lipid profile showed an LDL of 98.  Given her recent diagnosis of peripheral arterial disease, we should consider a more potent statin and achieving an  LDL below 70.    Disposition:   FU with me 2 weeks after the procedure.  Signed,  Kathlyn Sacramento, MD  02/21/2020 9:47 AM    [facility]

## 2020-02-29 ENCOUNTER — Other Ambulatory Visit: Payer: Self-pay

## 2020-02-29 ENCOUNTER — Encounter: Payer: Self-pay | Admitting: Adult Health

## 2020-02-29 ENCOUNTER — Ambulatory Visit (INDEPENDENT_AMBULATORY_CARE_PROVIDER_SITE_OTHER): Payer: Medicare Other | Admitting: Adult Health

## 2020-02-29 VITALS — BP 142/60 | HR 75 | Temp 95.4°F | Ht 62.5 in | Wt 141.6 lb

## 2020-02-29 DIAGNOSIS — E7800 Pure hypercholesterolemia, unspecified: Secondary | ICD-10-CM

## 2020-02-29 DIAGNOSIS — I10 Essential (primary) hypertension: Secondary | ICD-10-CM | POA: Diagnosis not present

## 2020-02-29 DIAGNOSIS — I739 Peripheral vascular disease, unspecified: Secondary | ICD-10-CM | POA: Diagnosis not present

## 2020-02-29 DIAGNOSIS — I4819 Other persistent atrial fibrillation: Secondary | ICD-10-CM | POA: Diagnosis not present

## 2020-02-29 MED ORDER — ATORVASTATIN CALCIUM 40 MG PO TABS: 40.0000 mg | ORAL_TABLET | Freq: Every day | ORAL | 6 refills | Status: DC

## 2020-02-29 NOTE — Progress Notes (Signed)
Cardiology Office Note   Date:  02/29/2020   ID:  Holly Benson, DOB 02/01/1933, MRN [id number]  PCP:  Michael Boston, MD  Cardiologist:  Dr. Debara Pickett   History of Present Illness: Holly Benson is a 86 y.o. female who presents for focused visit to answer questions concerning her PAD, follow-up arteriogram with intervention with Dr. Fletcher Anon which is scheduled for 03/21/2020.  She is grateful to have a diagnosis and the potential to have her right leg pain alleviated.  She wished to go over her lab work and to talk more about the procedure.  She offers no new complaints.    Past Medical History:  Diagnosis Date  . Arrhythmia   . Arthritis   . Atrial fibrillation ([place])   . COPD (chronic obstructive pulmonary disease) ([place])   . Hyperlipidemia   . Hypertension   . Sleep apnea     Past Surgical History:  Procedure Laterality Date  . ABDOMINAL HYSTERECTOMY    . arm surgery Right    Broken arm and has a plate in it  . CATARACT EXTRACTION Bilateral   . COLONOSCOPY     More than 10 years ago In [facility]  . KNEE ARTHROSCOPY Right      Current Outpatient Medications  Medication Sig Dispense Refill  . albuterol (PROVENTIL HFA;VENTOLIN HFA) 108 (90 Base) MCG/ACT inhaler Inhale 2 puffs into the lungs every 4 (four) hours as needed for wheezing or shortness of breath. 1 Inhaler 3  . AMBULATORY NON FORMULARY MEDICATION 3-4 tablets daily. Medication Name: Colon Clear    . amiodarone (PACERONE) 200 MG tablet Take 1 tablet (200 mg total) by mouth daily. 90 tablet 3  . amLODipine (NORVASC) 5 MG tablet Take 1 tablet (5 mg total) by mouth daily. 30 tablet 6  . apixaban (ELIQUIS) 5 MG TABS tablet Take 1 tablet (5 mg total) by mouth 2 (two) times daily. 180 tablet 2  . cetirizine (ZYRTEC) 10 MG tablet Take 10 mg by mouth daily.    . clonazePAM (KLONOPIN) 0.5 MG tablet Take 0.5 tablets by mouth as needed.    . Coenzyme Q10 (CO Q-10 PO) Take 1 tablet by mouth daily.    . hydrochlorothiazide  (MICROZIDE) 12.5 MG capsule Take 1 capsule (12.5 mg total) by mouth daily. 90 capsule 3  . irbesartan (AVAPRO) 150 MG tablet Take 1 tablet (150 mg total) by mouth daily. 30 tablet 3  . LINZESS 290 MCG CAPS capsule TAKE ONE CAPSULE BY MOUTH EVERY MORNING BEFORE BREAKFAST 30 capsule 2  . QUERCETIN PO Take 500 mg by mouth daily.    Marland Kitchen STIOLTO RESPIMAT 2.5-2.5 MCG/ACT AERS USE 2 INHALATIONS ORALLY   EVERY MORNING 12 g 2  . VITAMIN D PO Take 1 tablet by mouth daily.    Marland Kitchen atorvastatin (LIPITOR) 40 MG tablet Take 1 tablet (40 mg total) by mouth daily. 30 tablet 6   No current facility-administered medications for this visit.    Allergies:   Lovenox [enoxaparin sodium], Lovenox [enoxaparin sodium], and Metoprolol    Social History:  The patient  reports that she quit smoking about 28 years ago. She has a 45.00 pack-year smoking history. She has never used smokeless tobacco. She reports current alcohol use. She reports that she does not use drugs.   Family History:  The patient's family history includes Stroke in her maternal grandfather; Suicidality in her father.    ROS: All other systems are reviewed and negative. Unless otherwise mentioned in H&P  PHYSICAL EXAM: VS:  BP (!) 142/60   Pulse 75   Temp (!) 95.4 F (35.2 C)   Ht 5' 2.5" (1.588 m)   Wt 141 lb 9.6 oz (64.2 kg)   SpO2 95%   BMI 25.49 kg/m  , BMI Body mass index is 25.49 kg/m. GEN: Well nourished, well developed, in no acute distress HEENT: normal Cardiac: RRR; no murmurs, rubs, or gallops,no edema  Respiratory:  Clear to auscultation bilaterally, normal work of breathing MS: no deformity or atrophy Neuro:  Strength and sensation are intact Psych: euthymic mood, full affect   EKG: Not completed.  Recent Labs: 08/24/2019: ALT 18; TSH 1.860 02/01/2020: BUN 25; Creatinine, Ser 1.11; Potassium 4.4; Sodium 137    Lipid Panel    Component Value Date/Time   CHOL 196 02/01/2020 0822   TRIG 66 02/01/2020 0822   HDL 86  02/01/2020 0822   CHOLHDL 2.3 02/01/2020 0822   LDLCALC 98 02/01/2020 0822      Wt Readings from Last 3 Encounters:  02/29/20 141 lb 9.6 oz (64.2 kg)  02/21/20 141 lb 6.4 oz (64.1 kg)  02/07/20 139 lb (63 kg)      Other studies Reviewed: 02/20/2020 ABI Right: Resting right ankle-brachial index indicates mild right lower  extremity arterial disease. The right toe-brachial index is abnormal.   Left: Resting left ankle-brachial index indicates moderate left lower  extremity arterial disease. The left toe-brachial index is abnormal.   Vascular LE ultrasound  Right: Atherosclerosis of the right lower extremity arteries without focal  stenosis. Three-vessel runoff.   Left: Atherosclerosis of the left lower extremity arteries without focal  stenosis. Three-vessel runoff.   ASSESSMENT AND PLAN:  1.  PAD: She is scheduled for aortogram with run-ff by Dr.Arida on 03/21/2020.She has had several questions answered. She is ready to proceed,  2. Hyperlipidemia: LDL was 96 on recent labs on 02/01/2020. Prefer LDL to be <70 with known PAD. She is on Pravastatin 40 mg daily. I will change her to atorvastatin 40 mg daily.  She may need to have up titration on follow up labs.  3. [id number] on Eliquis amiodarone.   4. Hypertension: She has some weakness after taking am medications with hypotension. I will discontinue HCTZ in hopes that this will help.     Current medicines are reviewed at length with the patient today.  I have spent 10 minutes dedicated to the care of this patient on the date of this encounter to include pre-visit review of records, assessment, management and diagnostic testing,with shared decision making.  Labs/ tests ordered today include: None  Phill Myron. West Pugh, ANP, [facility]   02/29/2020 11:05 AM    [facility] [address] Office [phone] Fax [phone]  Notice: This dictation was prepared with Dragon dictation along  with smaller phrase technology. Any transcriptional errors that result from this process are unintentional and may not be corrected upon review.

## 2020-02-29 NOTE — Patient Instructions (Signed)
Medication Instructions:  TAKE- Hydrochlorothiazide on an as needed basis STOP- Pravastatin START- Atorvastatin 40 mg by mouth daily  *If you need a refill on your cardiac medications before your next appointment, please call your pharmacy*   Lab Work: None Ordered   Testing/Procedures: None Ordered   Follow-Up: At [facility], you and your health needs are our priority.  As part of our continuing mission to provide you with exceptional heart care, we have created designated Provider Care Teams.  These Care Teams include your primary Cardiologist (physician) and Advanced Practice Providers (APPs -  Physician Assistants and Nurse Practitioners) who all work together to provide you with the care you need, when you need it.  We recommend signing up for the patient portal called "MyChart".  Sign up information is provided on this After Visit Summary.  MyChart is used to connect with patients for Virtual Visits (Telemedicine).  Patients are able to view lab/test results, encounter notes, upcoming appointments, etc.  Non-urgent messages can be sent to your provider as well.   To learn more about what you can do with MyChart, go to [email or web address].    Your next appointment:   Keep appointment with Dr Fletcher Anon in June

## 2020-03-10 ENCOUNTER — Other Ambulatory Visit: Payer: Self-pay

## 2020-03-10 ENCOUNTER — Encounter (HOSPITAL_BASED_OUTPATIENT_CLINIC_OR_DEPARTMENT_OTHER): Payer: Self-pay | Admitting: Emergency Medicine

## 2020-03-10 ENCOUNTER — Emergency Department (HOSPITAL_BASED_OUTPATIENT_CLINIC_OR_DEPARTMENT_OTHER): Payer: Medicare Other

## 2020-03-10 ENCOUNTER — Emergency Department (HOSPITAL_BASED_OUTPATIENT_CLINIC_OR_DEPARTMENT_OTHER)
Admission: EM | Admit: 2020-03-10 | Discharge: 2020-03-10 | Disposition: A | Discharge status: Home / Self Care | Payer: Medicare Other | Attending: Emergency Medicine | Admitting: Emergency Medicine

## 2020-03-10 DIAGNOSIS — J449 Chronic obstructive pulmonary disease, unspecified: Secondary | ICD-10-CM | POA: Diagnosis not present

## 2020-03-10 DIAGNOSIS — W010XXA Fall on same level from slipping, tripping and stumbling without subsequent striking against object, initial encounter: Secondary | ICD-10-CM | POA: Insufficient documentation

## 2020-03-10 DIAGNOSIS — Y999 Unspecified external cause status: Secondary | ICD-10-CM | POA: Diagnosis not present

## 2020-03-10 DIAGNOSIS — Y939 Activity, unspecified: Secondary | ICD-10-CM | POA: Insufficient documentation

## 2020-03-10 DIAGNOSIS — Z79899 Other long term (current) drug therapy: Secondary | ICD-10-CM | POA: Diagnosis not present

## 2020-03-10 DIAGNOSIS — S0083XA Contusion of other part of head, initial encounter: Secondary | ICD-10-CM

## 2020-03-10 DIAGNOSIS — S01111A Laceration without foreign body of right eyelid and periocular area, initial encounter: Secondary | ICD-10-CM | POA: Diagnosis not present

## 2020-03-10 DIAGNOSIS — Z7901 Long term (current) use of anticoagulants: Secondary | ICD-10-CM | POA: Diagnosis not present

## 2020-03-10 DIAGNOSIS — S0990XA Unspecified injury of head, initial encounter: Secondary | ICD-10-CM | POA: Insufficient documentation

## 2020-03-10 DIAGNOSIS — I10 Essential (primary) hypertension: Secondary | ICD-10-CM | POA: Diagnosis not present

## 2020-03-10 DIAGNOSIS — Z87891 Personal history of nicotine dependence: Secondary | ICD-10-CM | POA: Insufficient documentation

## 2020-03-10 DIAGNOSIS — Y929 Unspecified place or not applicable: Secondary | ICD-10-CM | POA: Diagnosis not present

## 2020-03-10 MED ORDER — LIDOCAINE-EPINEPHRINE-TETRACAINE (LET) TOPICAL GEL
TOPICAL | Status: AC
  Filled 2020-03-10: qty 3

## 2020-03-10 MED ORDER — LIDOCAINE-EPINEPHRINE-TETRACAINE (LET) TOPICAL GEL
3.0000 mL | Freq: Once | TOPICAL | Status: AC
  Administered 2020-03-10: 3 mL via TOPICAL

## 2020-03-10 NOTE — ED Triage Notes (Signed)
Pt s/p mechanical fall around 2030 tonight. Pt hit the corner of the hearth. Lac/abrasions to R eye and cheek that are oozing. Pt is on eliquis. Denies LOC or other injury.

## 2020-03-10 NOTE — ED Provider Notes (Signed)
[facility]ENCY DEPARTMENT Provider Note   CSN: [id number] Arrival date & time: 03/10/20  2118     History Chief Complaint  Patient presents with  . Facial Injury    Holly Benson is a 86 y.o. female.  The history is provided by the patient.  Facial Injury Mechanism of injury:  Fall Location:  R cheek and face Time since incident:  1 hour Pain details:    Quality:  Aching and dull   Severity:  Mild   Timing:  Constant   Progression:  Unchanged Foreign body present:  No foreign bodies Relieved by:  None tried Worsened by:  Nothing Ineffective treatments:  None tried Associated symptoms: no altered mental status, no difficulty breathing, no double vision, no epistaxis, no headaches, no loss of consciousness, no malocclusion and no neck pain   Associated symptoms comment:  No vision changed.  Laceration to the right eyebrow.  No LOC but is on eliquis.  Able to walk after fall without difficulty.  Tripped and fell forward hitting her face on the hearth.      Past Medical History:  Diagnosis Date  . Arrhythmia   . Arthritis   . Atrial fibrillation ([place])   . COPD (chronic obstructive pulmonary disease) ([place])   . Hyperlipidemia   . Hypertension   . Sleep apnea     Patient Active Problem List   Diagnosis Date Noted  . Excessive daytime sleepiness 09/22/2018  . Typical atrial flutter ([place]) 01/12/2018  . Cardiomyopathy ([place]) 11/10/2017  . Shortness of breath 11/10/2017  . Heart palpitations 09/10/2017  . PAF (paroxysmal atrial fibrillation) ([place]) 09/10/2017  . Essential hypertension 09/10/2017  . Mixed hyperlipidemia 09/10/2017  . COPD (chronic obstructive pulmonary disease) ([place]) 07/21/2017  . Cough 07/21/2017    Past Surgical History:  Procedure Laterality Date  . ABDOMINAL HYSTERECTOMY    . arm surgery Right    Broken arm and has a plate in it  . CATARACT EXTRACTION Bilateral   . COLONOSCOPY     More than 10 years ago In [facility]  .  KNEE ARTHROSCOPY Right      OB History   No obstetric history on file.     Family History  Problem Relation Age of Onset  . Suicidality Father   . Stroke Maternal Grandfather   . Colon cancer Neg Hx   . Esophageal cancer Neg Hx     Social History   Tobacco Use  . Smoking status: Former Smoker    Packs/day: 1.50    Years: 30.00    Pack years: 45.00    Quit date: 11/04/1991    Years since quitting: 28.3  . Smokeless tobacco: Never Used  Substance Use Topics  . Alcohol use: Yes    Comment: 1 glass of wine/ night  . Drug use: No    Home Medications Prior to Admission medications   Medication Sig Start Date End Date Taking? Authorizing Provider  acetaminophen (TYLENOL) 325 MG tablet Take 650 mg by mouth every 6 (six) hours as needed for moderate pain or headache.    [provider]  amiodarone (PACERONE) 200 MG tablet Take 1 tablet (200 mg total) by mouth daily. 08/24/19   [name], PA-C  amLODipine (NORVASC) 5 MG tablet Take 1 tablet (5 mg total) by mouth daily. Patient taking differently: Take 5 mg by mouth every evening.  02/07/20   Camnitz, Will Hassell Done, MD  apixaban (ELIQUIS) 5 MG TABS tablet Take 1 tablet (5  mg total) by mouth 2 (two) times daily. 10/26/19   Hilty, Nadean Corwin, MD  atorvastatin (LIPITOR) 40 MG tablet Take 1 tablet (40 mg total) by mouth daily. 02/29/20 05/29/20  Lendon Colonel, NP  Carboxymethylcellulose Sodium (THERATEARS OP) Place 1 drop into both eyes daily as needed (dry eyes).    [provider]  cetirizine (ZYRTEC) 10 MG tablet Take 10 mg by mouth daily as needed for allergies.     [provider]  clonazePAM (KLONOPIN) 0.5 MG tablet Take 0.25 tablets by mouth at bedtime as needed (restless legs).  09/02/18   [provider]  Coenzyme Q10 (COQ-10) 100 MG CAPS Take 100 mg by mouth in the morning and at bedtime.    [provider]  hydrochlorothiazide (MICROZIDE) 12.5 MG capsule Take 1 capsule  (12.5 mg total) by mouth daily. Patient not taking: Reported on 03/09/2020 02/14/20   Pixie Casino, MD  irbesartan (AVAPRO) 150 MG tablet Take 1 tablet (150 mg total) by mouth daily. 01/24/20   Cleaver, Jossie Ng, NP  LINZESS 290 MCG CAPS capsule TAKE ONE CAPSULE BY MOUTH EVERY MORNING BEFORE BREAKFAST Patient taking differently: Take 290 mcg by mouth daily before breakfast.  01/19/20   Nandigam, Venia Minks, MD  Misc Natural Products (COLON CLEANSE) CAPS Take 4 capsules by mouth daily as needed (constipation).    [provider]  Multiple Vitamins-Minerals (ZINC PO) Take 1 tablet by mouth daily.    [provider]  OVER THE COUNTER MEDICATION Take 1 tablet by mouth daily. drenatrophin pmg otc supplement    [provider]  polyethylene glycol (MIRALAX / GLYCOLAX) 17 g packet Take 17 g by mouth daily as needed for moderate constipation.    [provider]  STIOLTO RESPIMAT 2.5-2.5 MCG/ACT AERS USE 2 [place] Patient taking differently: Inhale 2 puffs into the lungs daily.  05/05/19   Collene Gobble, MD  Wheat Dextrin (BENEFIBER) POWD Take 1 Dose by mouth 3 (three) times daily.    [provider]    Allergies    Lovenox [enoxaparin sodium] and Metoprolol  Review of Systems   Review of Systems  HENT: Negative for nosebleeds.   Eyes: Negative for double vision.  Musculoskeletal: Negative for neck pain.  Neurological: Negative for loss of consciousness and headaches.  All other systems reviewed and are negative.   Physical Exam Updated Vital Signs BP 131/65 (BP Location: Right Arm)   Pulse 71   Temp 97.7 F (36.5 C) (Oral)   Resp 18   Ht 5' 2.5" (1.588 m)   Wt 63.5 kg   SpO2 98%   BMI 25.20 kg/m   Physical Exam Vitals and nursing note reviewed.  Constitutional:      General: She is not in acute distress.    Appearance: Normal appearance. She is normal weight.  HENT:     Head: Normocephalic.      Nose: Nose  normal.     Mouth/Throat:     Mouth: Mucous membranes are moist.  Eyes:     Extraocular Movements: Extraocular movements intact.     Pupils: Pupils are equal, round, and reactive to light.   Cardiovascular:     Rate and Rhythm: Normal rate.  Pulmonary:     Effort: Pulmonary effort is normal.  Musculoskeletal:        General: No tenderness.     Cervical back: Normal range of motion and neck supple. No tenderness.  Skin:  General: Skin is warm and dry.  Neurological:     Mental Status: She is alert. Mental status is at baseline.  Psychiatric:        Mood and Affect: Mood normal.        Behavior: Behavior normal.        Thought Content: Thought content normal.     ED Results / Procedures / Treatments   Labs (all labs ordered are listed, but only abnormal results are displayed) Labs Reviewed - No data to display  EKG None  Radiology CT Head Wo Contrast  Result Date: 03/10/2020 CLINICAL DATA:  86 year old female with head trauma. EXAM: CT HEAD WITHOUT CONTRAST CT MAXILLOFACIAL WITHOUT CONTRAST TECHNIQUE: Multidetector CT imaging of the head and maxillofacial structures were performed using the standard protocol without intravenous contrast. Multiplanar CT image reconstructions of the maxillofacial structures were also generated. COMPARISON:  None. FINDINGS: CT HEAD FINDINGS Brain: Mild age-related atrophy and chronic microvascular ischemic changes. There is no acute intracranial hemorrhage. No mass effect or midline shift. No extra-axial fluid collection. Vascular: No hyperdense vessel or unexpected calcification. Skull: Normal. Negative for fracture or focal lesion. Other: None CT MAXILLOFACIAL FINDINGS Osseous: No fracture or mandibular dislocation. No destructive process. Orbits: Negative. No traumatic or inflammatory finding. Sinuses: Clear. Soft tissues: Mild right facial subcutaneous contusion. No large hematoma. IMPRESSION: 1. No acute intracranial pathology. Mild age-related  atrophy and chronic microvascular ischemic changes. 2. No acute/traumatic facial bone fractures. Electronically Signed   By: Anner Crete M.D.   On: 03/10/2020 22:52   CT Maxillofacial Wo Contrast  Result Date: 03/10/2020 CLINICAL DATA:  86 year old female with head trauma. EXAM: CT HEAD WITHOUT CONTRAST CT MAXILLOFACIAL WITHOUT CONTRAST TECHNIQUE: Multidetector CT imaging of the head and maxillofacial structures were performed using the standard protocol without intravenous contrast. Multiplanar CT image reconstructions of the maxillofacial structures were also generated. COMPARISON:  None. FINDINGS: CT HEAD FINDINGS Brain: Mild age-related atrophy and chronic microvascular ischemic changes. There is no acute intracranial hemorrhage. No mass effect or midline shift. No extra-axial fluid collection. Vascular: No hyperdense vessel or unexpected calcification. Skull: Normal. Negative for fracture or focal lesion. Other: None CT MAXILLOFACIAL FINDINGS Osseous: No fracture or mandibular dislocation. No destructive process. Orbits: Negative. No traumatic or inflammatory finding. Sinuses: Clear. Soft tissues: Mild right facial subcutaneous contusion. No large hematoma. IMPRESSION: 1. No acute intracranial pathology. Mild age-related atrophy and chronic microvascular ischemic changes. 2. No acute/traumatic facial bone fractures. Electronically Signed   By: Anner Crete M.D.   On: 03/10/2020 22:52    Procedures Procedures (including critical care time) LACERATION REPAIR Performed by: [employer] Authorized by: Blanchie Dessert Consent: Verbal consent obtained. Risks and benefits: risks, benefits and alternatives were discussed Consent given by: patient Patient identity confirmed: provided demographic data Prepped and Draped in normal sterile fashion Wound explored  Laceration Location: right eyebrow  Laceration Length: 2cm  No Foreign Bodies seen or palpated  Anesthesia: local   Local  anesthetic: LET  Anesthetic total: 1 ml  Irrigation method: skin scrub Amount of cleaning: standard  Skin closure: 6.0 prolene  Number of sutures: 3  Technique: simple interrupted  Patient tolerance: Patient tolerated the procedure well with no immediate complications.   Medications Ordered in ED Medications  lidocaine-EPINEPHrine-tetracaine (LET) topical gel (3 mLs Topical Given 03/10/20 2213)    ED Course  I have reviewed the triage vital signs and the nursing notes.  Pertinent labs & imaging results that were available during my care of the  patient were reviewed by me and considered in my medical decision making (see chart for details).    MDM Rules/Calculators/A&P                      Elderly female who tripped and fell today hitting her head on the hearth.  Patient is anticoagulated with Eliquis.  She denies any loss of consciousness or headache at this time.  She does have facial injury with eyebrow laceration and skin tear to the right eyelid and contusion of her cheek.  She has no visual changes and eye exam is normal.  Wound repaired as above.  CT of head and face are negative for acute injury.  Patient is stable for discharge home. Final Clinical Impression(s) / ED Diagnoses Final diagnoses:  Eyebrow laceration, right, initial encounter  Facial contusion, initial encounter    Rx / DC Orders ED Discharge Orders    None       Blanchie Dessert, MD 03/10/20 2325

## 2020-03-15 DIAGNOSIS — Z01812 Encounter for preprocedural laboratory examination: Secondary | ICD-10-CM | POA: Diagnosis not present

## 2020-03-15 DIAGNOSIS — Z4802 Encounter for removal of sutures: Secondary | ICD-10-CM | POA: Diagnosis not present

## 2020-03-15 DIAGNOSIS — M79604 Pain in right leg: Secondary | ICD-10-CM | POA: Diagnosis not present

## 2020-03-15 DIAGNOSIS — M79605 Pain in left leg: Secondary | ICD-10-CM | POA: Diagnosis not present

## 2020-03-15 LAB — BASIC METABOLIC PANEL
BUN/Creatinine Ratio: 17 (ref 12–28)
BUN: 19 mg/dL (ref 8–27)
CO2: 23 mmol/L (ref 20–29)
Calcium: 9.7 mg/dL (ref 8.7–10.3)
Chloride: 99 mmol/L (ref 96–106)
Creatinine, Ser: 1.11 mg/dL — ABNORMAL HIGH (ref 0.57–1.00)
GFR calc Af Amer: 53 mL/min/{1.73_m2} — ABNORMAL LOW (ref >59)
GFR calc non Af Amer: 46 mL/min/{1.73_m2} — ABNORMAL LOW (ref >59)
Glucose: 129 mg/dL — ABNORMAL HIGH (ref 65–99)
Potassium: 4.9 mmol/L (ref 3.5–5.2)
Sodium: 137 mmol/L (ref 134–144)

## 2020-03-15 LAB — CBC WITH DIFFERENTIAL/PLATELET
Basophils Absolute: 0.1 10*3/uL (ref 0.0–0.2)
Basos: 1 %
EOS (ABSOLUTE): 0.1 10*3/uL (ref 0.0–0.4)
Eos: 1 %
Hematocrit: 44.6 % (ref 34.0–46.6)
Hemoglobin: 15.2 g/dL (ref 11.1–15.9)
Immature Grans (Abs): 0 10*3/uL (ref 0.0–0.1)
Immature Granulocytes: 0 %
Lymphocytes Absolute: 2 10*3/uL (ref 0.7–3.1)
Lymphs: 30 %
MCH: 32.8 pg (ref 26.6–33.0)
MCHC: 34.1 g/dL (ref 31.5–35.7)
MCV: 96 fL (ref 79–97)
Monocytes Absolute: 0.8 10*3/uL (ref 0.1–0.9)
Monocytes: 12 %
Neutrophils Absolute: 3.7 10*3/uL (ref 1.4–7.0)
Neutrophils: 56 %
Platelets: 359 10*3/uL (ref 150–450)
RBC: 4.63 x10E6/uL (ref 3.77–5.28)
RDW: 13 % (ref 11.7–15.4)
WBC: 6.6 10*3/uL (ref 3.4–10.8)

## 2020-03-15 LAB — PT AND PTT
INR: 1.1 (ref 0.9–1.2)
Prothrombin Time: 11.5 s (ref 9.1–12.0)
aPTT: 33 s (ref 24–33)

## 2020-03-16 ENCOUNTER — Ambulatory Visit: Payer: Medicare Other | Admitting: Internal Medicine

## 2020-03-16 ENCOUNTER — Other Ambulatory Visit (HOSPITAL_COMMUNITY)
Admission: RE | Admit: 2020-03-16 | Discharge: 2020-03-16 | Disposition: A | Discharge status: Home / Self Care | Payer: Medicare Other | Source: Ambulatory Visit | Attending: Cardiovascular Disease | Admitting: Cardiovascular Disease

## 2020-03-16 DIAGNOSIS — Z01812 Encounter for preprocedural laboratory examination: Secondary | ICD-10-CM | POA: Insufficient documentation

## 2020-03-16 DIAGNOSIS — Z20822 Contact with and (suspected) exposure to covid-19: Secondary | ICD-10-CM | POA: Diagnosis not present

## 2020-03-16 LAB — SARS CORONAVIRUS 2 (TAT 6-24 HRS): SARS Coronavirus 2: NEGATIVE

## 2020-03-20 ENCOUNTER — Telehealth: Payer: Self-pay | Admitting: *Deleted

## 2020-03-20 NOTE — Telephone Encounter (Addendum)
Pt contacted pre-catheterization scheduled at [facility] for: Wednesday Mar 21, 2020 8:30 AM Verified arrival time and place: [facility]) at: 6:30 AM   No solid food after midnight prior to cath, clear liquids until 5 AM day of procedure.  Hold: Eliquis-none 03/19/20 until post procedure HCTZ-day before and day of procedure-pt not currently taking  Irbesartan-day before and day of procedure-GFR 46-pt already taken today  Except hold medications AM meds can be  taken pre-cath with sip of water including: ASA 81 mg   Confirmed patient has responsible adult to drive home post procedure and observe 24 hours after arriving home: yes  You are allowed ONE visitor in the waiting room during your procedure. Both you and your visitor must wear masks.      COVID-19 Pre-Screening Questions:  . In the past 7 to 10 days have you had a cough,  shortness of breath, headache, congestion, fever (100 or greater) body aches, chills, sore throat, or sudden loss of taste or sense of smell? Shortness of breath, not new, body aches-general, not new . Have you been around anyone with known Covid 19 in the past 7 to 10 days? no . Have you been around anyone who is awaiting Covid 19 test results in the past 7 to 10 days? No . Have you been around anyone who has mentioned symptoms of Covid 19 within the past 7 to 10 days? no  Reviewed procedure/mask/visitor instructions, COVID-19 screening questions with patient.

## 2020-03-21 ENCOUNTER — Encounter (HOSPITAL_COMMUNITY)
Admission: RE | Disposition: A | Discharge status: Home / Self Care | Payer: Self-pay | Source: Home / Self Care | Attending: Cardiovascular Disease

## 2020-03-21 ENCOUNTER — Ambulatory Visit (HOSPITAL_COMMUNITY)
Admission: RE | Admit: 2020-03-21 | Discharge: 2020-03-21 | Disposition: A | Discharge status: Home / Self Care | Payer: Medicare Other | Attending: Cardiovascular Disease | Admitting: Cardiovascular Disease

## 2020-03-21 DIAGNOSIS — G4733 Obstructive sleep apnea (adult) (pediatric): Secondary | ICD-10-CM | POA: Insufficient documentation

## 2020-03-21 DIAGNOSIS — I10 Essential (primary) hypertension: Secondary | ICD-10-CM | POA: Insufficient documentation

## 2020-03-21 DIAGNOSIS — Z79899 Other long term (current) drug therapy: Secondary | ICD-10-CM | POA: Diagnosis not present

## 2020-03-21 DIAGNOSIS — I70213 Atherosclerosis of native arteries of extremities with intermittent claudication, bilateral legs: Secondary | ICD-10-CM | POA: Diagnosis not present

## 2020-03-21 DIAGNOSIS — F419 Anxiety disorder, unspecified: Secondary | ICD-10-CM | POA: Diagnosis not present

## 2020-03-21 DIAGNOSIS — I4891 Unspecified atrial fibrillation: Secondary | ICD-10-CM | POA: Insufficient documentation

## 2020-03-21 DIAGNOSIS — Z87891 Personal history of nicotine dependence: Secondary | ICD-10-CM | POA: Diagnosis not present

## 2020-03-21 DIAGNOSIS — M1990 Unspecified osteoarthritis, unspecified site: Secondary | ICD-10-CM | POA: Diagnosis not present

## 2020-03-21 DIAGNOSIS — Z7901 Long term (current) use of anticoagulants: Secondary | ICD-10-CM | POA: Diagnosis not present

## 2020-03-21 DIAGNOSIS — J449 Chronic obstructive pulmonary disease, unspecified: Secondary | ICD-10-CM | POA: Insufficient documentation

## 2020-03-21 DIAGNOSIS — E785 Hyperlipidemia, unspecified: Secondary | ICD-10-CM | POA: Insufficient documentation

## 2020-03-21 DIAGNOSIS — I739 Peripheral vascular disease, unspecified: Secondary | ICD-10-CM

## 2020-03-21 HISTORY — PX: ABDOMINAL AORTOGRAM W/LOWER EXTREMITY: CATH118223

## 2020-03-21 HISTORY — PX: PERIPHERAL VASCULAR INTERVENTION: CATH118257

## 2020-03-21 LAB — POCT ACTIVATED CLOTTING TIME: Activated Clotting Time: 197 seconds

## 2020-03-21 SURGERY — ABDOMINAL AORTOGRAM W/LOWER EXTREMITY
Anesthesia: LOCAL

## 2020-03-21 MED ORDER — SODIUM CHLORIDE 0.9% FLUSH: 3.0000 mL | Freq: Two times a day (BID) | INTRAVENOUS | Status: DC

## 2020-03-21 MED ORDER — HEPARIN (PORCINE) IN NACL 1000-0.9 UT/500ML-% IV SOLN
INTRAVENOUS | Status: AC
  Filled 2020-03-21: qty 1000

## 2020-03-21 MED ORDER — LIDOCAINE HCL (PF) 1 % IJ SOLN
INTRAMUSCULAR | Status: AC
  Filled 2020-03-21: qty 30

## 2020-03-21 MED ORDER — IODIXANOL 320 MG/ML IV SOLN
INTRAVENOUS | Status: DC | PRN
  Administered 2020-03-21: 85 mL via INTRA_ARTERIAL

## 2020-03-21 MED ORDER — HEPARIN SODIUM (PORCINE) 1000 UNIT/ML IJ SOLN
INTRAMUSCULAR | Status: DC | PRN
  Administered 2020-03-21: 5000 [IU] via INTRAVENOUS
  Administered 2020-03-21: 2000 [IU] via INTRAVENOUS

## 2020-03-21 MED ORDER — SODIUM CHLORIDE 0.9 % IV SOLN: 250.0000 mL | INTRAVENOUS | Status: DC | PRN

## 2020-03-21 MED ORDER — CLOPIDOGREL BISULFATE 300 MG PO TABS
ORAL_TABLET | ORAL | Status: DC | PRN
  Administered 2020-03-21: 300 mg via ORAL

## 2020-03-21 MED ORDER — SODIUM CHLORIDE 0.9 % IV SOLN: INTRAVENOUS | Status: DC

## 2020-03-21 MED ORDER — ASPIRIN 81 MG PO CHEW: 81.0000 mg | CHEWABLE_TABLET | ORAL | Status: DC

## 2020-03-21 MED ORDER — FENTANYL CITRATE (PF) 100 MCG/2ML IJ SOLN
INTRAMUSCULAR | Status: DC | PRN
  Administered 2020-03-21: 25 ug via INTRAVENOUS

## 2020-03-21 MED ORDER — HEPARIN (PORCINE) IN NACL 1000-0.9 UT/500ML-% IV SOLN
INTRAVENOUS | Status: DC | PRN
  Administered 2020-03-21 (×2): 500 mL

## 2020-03-21 MED ORDER — SODIUM CHLORIDE 0.9% FLUSH: 3.0000 mL | INTRAVENOUS | Status: DC | PRN

## 2020-03-21 MED ORDER — FENTANYL CITRATE (PF) 100 MCG/2ML IJ SOLN
INTRAMUSCULAR | Status: AC
  Filled 2020-03-21: qty 2

## 2020-03-21 MED ORDER — HEPARIN SODIUM (PORCINE) 1000 UNIT/ML IJ SOLN
INTRAMUSCULAR | Status: AC
  Filled 2020-03-21: qty 1

## 2020-03-21 MED ORDER — CLOPIDOGREL BISULFATE 300 MG PO TABS
ORAL_TABLET | ORAL | Status: AC
  Filled 2020-03-21: qty 1

## 2020-03-21 MED ORDER — LIDOCAINE HCL (PF) 1 % IJ SOLN
INTRAMUSCULAR | Status: DC | PRN
  Administered 2020-03-21: 15 mL via INTRADERMAL

## 2020-03-21 MED ORDER — ASPIRIN 81 MG PO CHEW
81.0000 mg | CHEWABLE_TABLET | ORAL | Status: DC
  Filled 2020-03-21: qty 1

## 2020-03-21 MED ORDER — MIDAZOLAM HCL 2 MG/2ML IJ SOLN
INTRAMUSCULAR | Status: DC | PRN
  Administered 2020-03-21: 1 mg via INTRAVENOUS

## 2020-03-21 MED ORDER — CLOPIDOGREL BISULFATE 75 MG PO TABS
75.0000 mg | ORAL_TABLET | Freq: Every day | ORAL | 3 refills | Status: DC
Start: 2020-03-21 — End: 2020-04-24

## 2020-03-21 MED ORDER — MIDAZOLAM HCL 2 MG/2ML IJ SOLN
INTRAMUSCULAR | Status: AC
  Filled 2020-03-21: qty 2

## 2020-03-21 SURGICAL SUPPLY — 20 items
BALLN MUSTANG 7.0X40 135 (BALLOONS) ×3
BALLOON MUSTANG 7.0X40 135 (BALLOONS) IMPLANT
CATH ANGIO 5F PIGTAIL 65CM (CATHETERS) ×1 IMPLANT
CATH CROSS OVER TEMPO 5F (CATHETERS) ×1 IMPLANT
CLOSURE MYNX CONTROL 6F/7F (Vascular Products) ×1 IMPLANT
KIT ENCORE 26 ADVANTAGE (KITS) ×1 IMPLANT
KIT MICROPUNCTURE NIT STIFF (SHEATH) ×1 IMPLANT
KIT PV (KITS) ×3 IMPLANT
SHEATH PINNACLE 5F 10CM (SHEATH) ×1 IMPLANT
SHEATH PINNACLE 6F 10CM (SHEATH) ×1 IMPLANT
SHEATH PINNACLE ST 6F 45CM (SHEATH) ×1 IMPLANT
SHEATH PROBE COVER 6X72 (BAG) ×1 IMPLANT
STENT ELUVIA 7X40X130 (Permanent Stent) ×1 IMPLANT
STENT ELUVIA 7X60X130 (Permanent Stent) ×1 IMPLANT
SYR MEDRAD MARK 7 150ML (SYRINGE) ×3 IMPLANT
TRANSDUCER W/STOPCOCK (MISCELLANEOUS) ×3 IMPLANT
TRAY PV CATH (CUSTOM PROCEDURE TRAY) ×3 IMPLANT
TUBING CIL FLEX 10 FLL-RA (TUBING) ×1 IMPLANT
WIRE BENTSON .035X145CM (WIRE) ×1 IMPLANT
WIRE HI TORQ VERSACORE J 260CM (WIRE) ×1 IMPLANT

## 2020-03-21 NOTE — Discharge Instructions (Signed)
Femoral Site Care This sheet gives you information about how to care for yourself after your procedure. Your health care provider may also give you more specific instructions. If you have problems or questions, contact your health care provider. What can I expect after the procedure? After the procedure, it is common to have:  Bruising that usually fades within 1-2 weeks.  Tenderness at the site. Follow these instructions at home: Wound care  Follow instructions from your health care provider about how to take care of your insertion site. Make sure you: ? Wash your hands with soap and water before you change your bandage (dressing). If soap and water are not available, use hand sanitizer. ? Change your dressing as told by your health care provider. ? Leave stitches (sutures), skin glue, or adhesive strips in place. These skin closures may need to stay in place for 2 weeks or longer. If adhesive strip edges start to loosen and curl up, you may trim the loose edges. Do not remove adhesive strips completely unless your health care provider tells you to do that.  Do not take baths, swim, or use a hot tub until your health care provider approves.  You may shower 24-48 hours after the procedure or as told by your health care provider. ? Gently wash the site with plain soap and water. ? Pat the area dry with a clean towel. ? Do not rub the site. This may cause bleeding.  Do not apply powder or lotion to the site. Keep the site clean and dry.  Check your femoral site every day for signs of infection. Check for: ? Redness, swelling, or pain. ? Fluid or blood. ? Warmth. ? Pus or a bad smell. Activity  For the first 2-3 days after your procedure, or as long as directed: ? Avoid climbing stairs as much as possible. ? Do not squat.  Do not lift anything that is heavier than 10 lb (4.5 kg), or the limit that you are told, until your health care provider says that it is safe.  Rest as  directed. ? Avoid sitting for a long time without moving. Get up to take short walks every 1-2 hours.  Do not drive for 24 hours if you were given a medicine to help you relax (sedative). General instructions  Take over-the-counter and prescription medicines only as told by your health care provider.  Keep all follow-up visits as told by your health care provider. This is important. Contact a health care provider if you have:  A fever or chills.  You have redness, swelling, or pain around your insertion site. Get help right away if:  The catheter insertion area swells very fast.  You pass out.  You suddenly start to sweat or your skin gets clammy.  The catheter insertion area is bleeding, and the bleeding does not stop when you hold steady pressure on the area.  The area near or just beyond the catheter insertion site becomes pale, cool, tingly, or numb. These symptoms may represent a serious problem that is an emergency. Do not wait to see if the symptoms will go away. Get medical help right away. Call your local emergency services (911 in the [place] Do not drive yourself to the hospital. Summary  After the procedure, it is common to have bruising that usually fades within 1-2 weeks.  Check your femoral site every day for signs of infection.  Do not lift anything that is heavier than 10 lb (4.5 kg), or the   limit that you are told, until your health care provider says that it is safe. This information is not intended to replace advice given to you by your health care provider. Make sure you discuss any questions you have with your health care provider. Document Revised: 11/02/2017 Document Reviewed: 11/02/2017 Elsevier Patient Education  [place] tomorrow as long as no bleeding from the left groin. Start clopidogrel(Plavix) 75 mg once daily tomorrow.  A prescription was sent to your [place].

## 2020-03-21 NOTE — Progress Notes (Signed)
Discharge instructions reviewed with patient and family. Verbalized understanding. 

## 2020-03-21 NOTE — Interval H&P Note (Signed)
History and Physical Interval Note:  03/21/2020 8:45 AM  Holly Benson  has presented today for surgery, with the diagnosis of Bilateral Leg Pain.  The various methods of treatment have been discussed with the patient and family. After consideration of risks, benefits and other options for treatment, the patient has consented to  Procedure(s): ABDOMINAL AORTOGRAM W/ Bilateral LOWER EXTREMITY Runoff (N/A) as a surgical intervention.  The patient's history has been reviewed, patient examined, no change in status, stable for surgery.  I have reviewed the patient's chart and labs.  Questions were answered to the patient's satisfaction.     Kathlyn Sacramento

## 2020-03-22 ENCOUNTER — Other Ambulatory Visit: Payer: Self-pay | Admitting: *Deleted

## 2020-03-22 DIAGNOSIS — I739 Peripheral vascular disease, unspecified: Secondary | ICD-10-CM

## 2020-04-03 ENCOUNTER — Ambulatory Visit: Payer: Medicare Other | Admitting: Cardiology

## 2020-04-03 NOTE — Progress Notes (Deleted)
Electrophysiology Office Note   Date:  04/03/2020   ID:  Rosealyn Bouck, DOB 1932-11-24, MRN [id number]  PCP:  Michael Boston, MD  Cardiologist:  Debara Pickett Primary Electrophysiologist:  Paislynn Hegstrom Meredith Leeds, MD    Chief Complaint: AF/flutter   History of Present Illness: Holly Benson is a 86 y.o. female who is being seen today for the evaluation of atrial flutter at the request of [name], Jesse Sans, MD. Presenting today for electrophysiology evaluation.  She has a history of hypertension, atrial fibrillation/flutter status post cardioversion, hyperlipidemia, OSA on CPAP, and COPD.  She has had issues with shortness of breath and fatigue as well as spikes in her blood pressure.  She was having episodes of fatigue and shortness of breath.  It was thought that this was due to atrial flutter or atrial fibrillation.  Today, denies symptoms of palpitations, chest pain, shortness of breath, orthopnea, PND, lower extremity edema, claudication, dizziness, presyncope, syncope, bleeding, or neurologic sequela. The patient is tolerating medications without difficulties.  He is overall doing well.  She stopped her diltiazem and her atrial fibrillation improved.  Since then, she has had no further episodes.  She wore a cardiac monitor which showed sinus rhythm and no other arrhythmias.   Past Medical History:  Diagnosis Date  . Arrhythmia   . Arthritis   . Atrial fibrillation ([place])   . COPD (chronic obstructive pulmonary disease) ([place])   . Hyperlipidemia   . Hypertension   . Sleep apnea    Past Surgical History:  Procedure Laterality Date  . ABDOMINAL AORTOGRAM W/LOWER EXTREMITY N/A 03/21/2020   Procedure: ABDOMINAL AORTOGRAM W/ Bilateral LOWER EXTREMITY Runoff;  Surgeon: Wellington Hampshire, MD;  Location: [facility];  Service: Cardiovascular;  Laterality: N/A;  . ABDOMINAL HYSTERECTOMY    . arm surgery Right    Broken arm and has a plate in it  . CATARACT EXTRACTION Bilateral   . COLONOSCOPY      [facility] than 10 years ago [facility]  . KNEE ARTHROSCOPY Right   . PERIPHERAL VASCULAR INTERVENTION Bilateral 03/21/2020   Procedure: PERIPHERAL VASCULAR INTERVENTION;  Surgeon: Wellington Hampshire, MD;  Location: [place] [facility];  Service: Cardiovascular;  Laterality: Bilateral;  external iliac     Current Outpatient Medications  Medication Sig Dispense Refill  . acetaminophen (TYLENOL) 325 MG tablet Take 650 mg by mouth every 6 (six) hours as needed for moderate pain or headache.    Marland Kitchen amiodarone (PACERONE) 200 MG tablet Take 1 tablet (200 mg total) by mouth daily. 90 tablet 3  . amLODipine (NORVASC) 5 MG tablet Take 1 tablet (5 mg total) by mouth daily. (Patient taking differently: Take 5 mg by mouth every evening. ) 30 tablet 6  . apixaban (ELIQUIS) 5 MG TABS tablet Take 1 tablet (5 mg total) by mouth 2 (two) times daily. 180 tablet 2  . atorvastatin (LIPITOR) 40 MG tablet Take 1 tablet (40 mg total) by mouth daily. 30 tablet 6  . Carboxymethylcellulose Sodium (THERATEARS OP) Place 1 drop into both eyes daily as needed (dry eyes).    . cetirizine (ZYRTEC) 10 MG tablet Take 10 mg by mouth daily as needed for allergies.     . clonazePAM (KLONOPIN) 0.5 MG tablet Take 0.25 tablets by mouth at bedtime as needed (restless legs).     . clopidogrel (PLAVIX) 75 MG tablet Take 1 tablet (75 mg total) by mouth daily. 30 tablet 3  . Coenzyme Q10 (COQ-10) 100 MG CAPS  Take 100 mg by mouth in the morning and at bedtime.    . irbesartan (AVAPRO) 150 MG tablet Take 1 tablet (150 mg total) by mouth daily. 30 tablet 3  . LINZESS 290 MCG CAPS capsule TAKE ONE CAPSULE BY MOUTH EVERY MORNING BEFORE BREAKFAST (Patient taking differently: Take 290 mcg by mouth daily before breakfast. ) 30 capsule 2  . Misc Natural Products (COLON CLEANSE) CAPS Take 4 capsules by mouth daily as needed (constipation).    . Multiple Vitamins-Minerals (ZINC PO) Take 1 tablet by mouth daily.    Marland Kitchen OVER THE COUNTER MEDICATION Take  1 tablet by mouth daily. drenatrophin pmg otc supplement    . polyethylene glycol (MIRALAX / GLYCOLAX) 17 g packet Take 17 g by mouth daily as needed for moderate constipation.    Marland Kitchen STIOLTO RESPIMAT 2.5-2.5 MCG/ACT AERS USE 2 INHALATIONS ORALLY   EVERY MORNING (Patient taking differently: Inhale 2 puffs into the lungs daily. ) 12 g 2  . Wheat Dextrin (BENEFIBER) POWD Take 1 Dose by mouth 3 (three) times daily.     No current facility-administered medications for this visit.    Allergies:   Lovenox [enoxaparin sodium] and Metoprolol   Social History:  The patient  reports that she quit smoking about 28 years ago. She has a 45.00 pack-year smoking history. She has never used smokeless tobacco. She reports current alcohol use. She reports that she does not use drugs.   Family History:  The patient's family history includes Stroke in her maternal grandfather; Suicidality in her father.    ROS:  Please see the history of present illness.   Otherwise, review of systems is positive for none.   All other systems are reviewed and negative.   PHYSICAL EXAM: VS:  There were no vitals taken for this visit. , BMI There is no height or weight on file to calculate BMI. GEN: Well nourished, well developed, in no acute distress  HEENT: normal  Neck: no JVD, carotid bruits, or masses Cardiac: RRR; no murmurs, rubs, or gallops,no edema  Respiratory:  clear to auscultation bilaterally, normal work of breathing GI: soft, nontender, nondistended, + BS MS: no deformity or atrophy  Skin: warm and dry Neuro:  Strength and sensation are intact Psych: euthymic mood, full affect  EKG:  EKG is ordered today. Personal review of the ekg ordered shows sinus rhythm, rate 74  Recent Labs: 08/24/2019: ALT 18; TSH 1.860 03/15/2020: BUN 19; Creatinine, Ser 1.11; Hemoglobin 15.2; Platelets 359; Potassium 4.9; Sodium 137    Lipid Panel     Component Value Date/Time   CHOL 196 02/01/2020 0822   TRIG 66 02/01/2020  0822   HDL 86 02/01/2020 0822   CHOLHDL 2.3 02/01/2020 0822   LDLCALC 98 02/01/2020 0822     Wt Readings from Last 3 Encounters:  03/21/20 134 lb 8 oz (61 kg)  03/10/20 140 lb (63.5 kg)  02/29/20 141 lb 9.6 oz (64.2 kg)      Other studies Reviewed: Additional studies/ records that were reviewed today include: TTE 11/18/18  Review of the above records today demonstrates:  - Left ventricle: The cavity size was normal. Wall thickness was   increased in a pattern of mild LVH. Systolic function was normal.   The estimated ejection fraction was in the range of 60% to 65%.   Wall motion was normal; there were no regional wall motion   abnormalities. The study is not technically sufficient to allow   evaluation of LV diastolic  function. - Mitral valve: Mildly thickened leaflets . There was trivial   regurgitation. - Left atrium: Moderately dilated. - Right ventricle: The cavity size was mildly dilated. - Right atrium: Moderately dilated. - Tricuspid valve: There was moderate regurgitation. - Pulmonary arteries: PA peak pressure: 51 mm Hg (S). - Inferior vena cava: The vessel was dilated. The respirophasic   diameter changes were blunted (< 50%), consistent with elevated   central venous pressure.  Cardiac monitor 01/22/2020 personally reviewed Max 96 bpm 09:03am, 03/18 Min 48 bpm 04:29am, 03/20 Avg 66 bpm Less than 1% PACs and PVCs No atrial fibrillation noted Predominant rhythm was sinus rhythm Symptoms associated with sinus rhythm  ASSESSMENT AND PLAN:  1.  Typical atrial fibrillation/flutter: Currently on Eliquis, diltiazem, metoprolol, amiodarone.  CHA2DS2-VASc of 4.  Cardiac monitor shows no evidence of atrial fibrillation.  We Shirleymae Hauth continue with current management.   2. OSA: CPAP compliance encouraged  3.  Hypertension: Blood pressure is significantly elevated today.  Tamecca Artiga increase Norvasc to 5 mg and allow further adjustment by her primary physician. Current medicines  are reviewed at length with the patient today.    The patient does not have concerns regarding her medicines.  The following changes were made today: Increase Norvasc  Labs/ tests ordered today include:  No orders of the defined types were placed in this encounter.    Disposition:   FU with Aayra Hornbaker 6 months  Signed, Evalynne Locurto Meredith Leeds, MD  04/03/2020 8:49 AM     [facility] [address] [place] [address] [phone] (office) [phone] (fax)

## 2020-04-05 ENCOUNTER — Other Ambulatory Visit (HOSPITAL_COMMUNITY): Payer: Self-pay | Admitting: Cardiovascular Disease

## 2020-04-05 ENCOUNTER — Ambulatory Visit (HOSPITAL_COMMUNITY)
Admission: RE | Admit: 2020-04-05 | Discharge: 2020-04-05 | Disposition: A | Discharge status: Home / Self Care | Payer: Medicare Other | Source: Ambulatory Visit | Attending: Cardiology | Admitting: Cardiology

## 2020-04-05 ENCOUNTER — Other Ambulatory Visit: Payer: Self-pay

## 2020-04-05 ENCOUNTER — Ambulatory Visit (HOSPITAL_BASED_OUTPATIENT_CLINIC_OR_DEPARTMENT_OTHER)
Admission: RE | Admit: 2020-04-05 | Discharge: 2020-04-05 | Disposition: A | Discharge status: Home / Self Care | Payer: Medicare Other | Source: Ambulatory Visit | Attending: Cardiovascular Disease | Admitting: Cardiovascular Disease

## 2020-04-05 DIAGNOSIS — I739 Peripheral vascular disease, unspecified: Secondary | ICD-10-CM | POA: Diagnosis not present

## 2020-04-05 DIAGNOSIS — Z95828 Presence of other vascular implants and grafts: Secondary | ICD-10-CM

## 2020-04-08 NOTE — Progress Notes (Signed)
Cardiology Office Note   Date:  04/09/2020   ID:  Holly Benson, DOB June 05, 1933, MRN [id number]  PCP:  Michael Boston, MD  Cardiologist:  Pixie Casino, MD EP: [name], MD  Chief Complaint  Patient presents with  . Shortness of Breath  . Leg Swelling      History of Present Illness: Holly Benson is a 86 y.o. female with PMH of PAD s/p DES to bilateral external iliac arteries 03/21/20, paroxysmal atrial fibrillation/flutter, HTN, HLD, OSA, and COPD, who presents for close follow-up of recent PV procedure.   She was last evaluated by cardiology via a telemedicine visit with [name], NP 02/29/20 to discuss her upcoming aortogram which was planned to evaluate her LE pain. She recently underwent LEA's which suggested bilateral abnormal toe-brachial index's with mild RLE arterial disease and moderate LLE arterial disease noted. Aortoiliac duplex showed severe bilateral external iliac artery stenosis and significant left common iliac artery stenosis. She was seen by Dr. Fletcher Anon 02/21/2020 and she ultimately underwent an abdominal aortogram with lower extremity study 03/21/20 with DES to bilateral external iliac arteries. Follow-up studies showed improvement and she was recommended to undergo repeat testing in 6 months. Her last ischemic evaluation was a NST 12/2018 which was without ischemia, EF 71%. Her last echo in 2019 showed EF 60-65%, mild LVH, no RMWA, indeterminate LV diastolic function, moderate biatrial enlargement, and moderate MR.    There is a MyChart message 03/26/20 reporting increased SOB and LE edema, for which this visit was arranged.   She presents today for follow-up of recent DOE and LE edema. She reports overall improvement in symptoms since restarting HCTZ which was interrupted prior to her PV procedure to minimize risk to kidneys. She has some DOE which appears to be about baseline. Possible there is a COPD component - she plans to speak with her  pulmonologist to discuss having a rescue inhaler. No SOB at rest. She had some puffiness to her ankles which has improved. Also with intermittent weakness which often correlates with low blood pressure readings on her BP log she brought with her. BP generally in the 120s/80s with symptoms often occurring with BP is <110/70s. Thankfully no dizziness, lightheadedness, or syncope. No complaints of orthopnea, PND, or palpitations c/w previous Afib. No complaints of bleeding with eliquis and plavix.   Past Medical History:  Diagnosis Date  . Arrhythmia   . Arthritis   . Atrial fibrillation ([place])   . COPD (chronic obstructive pulmonary disease) ([place])   . Hyperlipidemia   . Hypertension   . Sleep apnea     Past Surgical History:  Procedure Laterality Date  . ABDOMINAL AORTOGRAM W/LOWER EXTREMITY N/A 03/21/2020   Procedure: ABDOMINAL AORTOGRAM W/ Bilateral LOWER EXTREMITY Runoff;  Surgeon: Wellington Hampshire, MD;  Location: [place] [facility];  Service: Cardiovascular;  Laterality: N/A;  . ABDOMINAL HYSTERECTOMY    . arm surgery Right    Broken arm and has a plate in it  . CATARACT EXTRACTION Bilateral   . COLONOSCOPY     More than 10 years ago In [facility] [address]  . KNEE ARTHROSCOPY Right   . PERIPHERAL VASCULAR INTERVENTION Bilateral 03/21/2020   Procedure: PERIPHERAL VASCULAR INTERVENTION;  Surgeon: Wellington Hampshire, MD;  Location: [place] [facility];  Service: Cardiovascular;  Laterality: Bilateral;  external iliac     Current Outpatient Medications  Medication Sig Dispense Refill  . acetaminophen (TYLENOL) 325 MG tablet Take 650 mg by mouth every  6 (six) hours as needed for moderate pain or headache.    Marland Kitchen amiodarone (PACERONE) 200 MG tablet Take 1 tablet (200 mg total) by mouth daily. 90 tablet 3  . apixaban (ELIQUIS) 5 MG TABS tablet Take 1 tablet (5 mg total) by mouth 2 (two) times daily. 180 tablet 2  . atorvastatin (LIPITOR) 40 MG tablet Take 1 tablet (40 mg total) by mouth daily. 30  tablet 6  . Carboxymethylcellulose Sodium (THERATEARS OP) Place 1 drop into both eyes daily as needed (dry eyes).    . cetirizine (ZYRTEC) 10 MG tablet Take 10 mg by mouth daily as needed for allergies.     . clonazePAM (KLONOPIN) 0.5 MG tablet Take 0.25 tablets by mouth at bedtime as needed (restless legs).     . clopidogrel (PLAVIX) 75 MG tablet Take 1 tablet (75 mg total) by mouth daily. 30 tablet 3  . Coenzyme Q10 (COQ-10) 100 MG CAPS Take 100 mg by mouth in the morning and at bedtime.    . hydrochlorothiazide (MICROZIDE) 12.5 MG capsule Take 12.5 mg by mouth daily.    . irbesartan (AVAPRO) 150 MG tablet Take 1 tablet (150 mg total) by mouth daily. 30 tablet 3  . LINZESS 290 MCG CAPS capsule TAKE ONE CAPSULE BY MOUTH EVERY MORNING BEFORE BREAKFAST (Patient taking differently: Take 290 mcg by mouth daily before breakfast. ) 30 capsule 2  . Misc Natural Products (COLON CLEANSE) CAPS Take 4 capsules by mouth daily as needed (constipation).    . Multiple Vitamins-Minerals (ZINC PO) Take 1 tablet by mouth daily.    Marland Kitchen OVER THE COUNTER MEDICATION Take 1 tablet by mouth daily. drenatrophin pmg otc supplement    . polyethylene glycol (MIRALAX / GLYCOLAX) 17 g packet Take 17 g by mouth daily as needed for moderate constipation.    Marland Kitchen STIOLTO RESPIMAT 2.5-2.5 MCG/ACT AERS USE 2 INHALATIONS ORALLY   EVERY MORNING (Patient taking differently: Inhale 2 puffs into the lungs daily. ) 12 g 2  . Wheat Dextrin (BENEFIBER) POWD Take 1 Dose by mouth 3 (three) times daily.     No current facility-administered medications for this visit.    Allergies:   Lovenox [enoxaparin sodium] and Metoprolol    Social History:  The patient  reports that she quit smoking about 28 years ago. She has a 45.00 pack-year smoking history. She has never used smokeless tobacco. She reports current alcohol use. She reports that she does not use drugs.   Family History:  The patient's family history includes Stroke in her maternal  grandfather; Suicidality in her father.    ROS:  Please see the history of present illness.   Otherwise, review of systems are positive for none.   All other systems are reviewed and negative.    PHYSICAL EXAM: VS:  BP 110/60   Pulse 68   Ht 5' 2.5" (1.588 m)   Wt 134 lb 6.4 oz (61 kg)   SpO2 96%   BMI 24.19 kg/m  , BMI Body mass index is 24.19 kg/m. GEN: Well nourished, well developed, in no acute distress HEENT: sclera anicteric Neck: no JVD, carotid bruits, or masses Cardiac: RRR; no murmurs, rubs, or gallops, no edema  Respiratory:  clear to auscultation bilaterally, normal work of breathing GI: soft, nontender, nondistended, + BS MS: no deformity or atrophy Skin: warm and dry, no rash Neuro:  Strength and sensation are intact Psych: euthymic mood, full affect   EKG:  EKG is not ordered today.  Recent Labs: 08/24/2019: ALT 18; TSH 1.860 03/15/2020: BUN 19; Creatinine, Ser 1.11; Hemoglobin 15.2; Platelets 359; Potassium 4.9; Sodium 137    Lipid Panel    Component Value Date/Time   CHOL 196 02/01/2020 0822   TRIG 66 02/01/2020 0822   HDL 86 02/01/2020 0822   CHOLHDL 2.3 02/01/2020 0822   LDLCALC 98 02/01/2020 0822      Wt Readings from Last 3 Encounters:  04/09/20 134 lb 6.4 oz (61 kg)  03/21/20 134 lb 8 oz (61 kg)  03/10/20 140 lb (63.5 kg)      Other studies Reviewed: Additional studies/ records that were reviewed today include:   NST 12/2018:  The left ventricular ejection fraction is hyperdynamic (>65%).  Nuclear stress EF: 71%.  There was downsloping of the ST segments in the inferolateral leads during infusion with no significan J point depression which rapidly resolved in early recovery.  The study is normal.  This is a low risk study.   Echocardiogram 2019: - Left ventricle: The cavity size was normal. Wall thickness was  increased in a pattern of mild LVH. Systolic function was normal.  The estimated ejection fraction was in the  range of 60% to 65%.  Wall motion was normal; there were no regional wall motion  abnormalities. The study is not technically sufficient to allow  evaluation of LV diastolic function.  - Mitral valve: Mildly thickened leaflets . There was trivial  regurgitation.  - Left atrium: Moderately dilated.  - Right ventricle: The cavity size was mildly dilated.  - Right atrium: Moderately dilated.  - Tricuspid valve: There was moderate regurgitation.  - Pulmonary arteries: PA peak pressure: 51 mm Hg (S).  - Inferior vena cava: The vessel was dilated. The respirophasic  diameter changes were blunted (< 50%), consistent with elevated  central venous pressure.   Impressions:   - LVEF 60-65%, mild LVH, normal wall motion, trivial MR, moderate  biatrial enlargement, mild RVE, RVSP 51 mmHg, dilated IVC.     ASSESSMENT AND PLAN:  1. DOE: perhaps a chronic diastolic CHF component as she was off her HCTZ for a bit following her PV procedure. She also has COPD which may be playing a roll. Symptoms generally improved since restarting HCTZ. She appears evolemic on exam today. No chest pain complaints and stress test 12/2018 was without ischemia.  - Will continue HCTZ 12.5mg  daily for now - She will reach out to her pulmonologist for possible COPD component.  - Could consider repeat NST if progressive symptoms.   2. PAD s/p DES to bilateral external iliac arteries 03/21/20: stable on follow-up imaging.  - Plan to repeat ABI's and aortic/iliac dopplers 10/2020 per Dr. Fletcher Anon - Continue plavix - anticipate stopping the end of August per Dr. Tyrell Antonio recommendations (appt 04/24/20) - Continue statin  3. Paroxysmal atrial fibrillation: HR 68 today with regular rhythm on exam. No complaints of palpitations to suggest recurrence.  - Continue amiodarone for rhythm control - Continue eliquis for stroke ppx  4. HTN: BP 110/60 today. She has several BP readings at home <100/60 and reports weakness at  that time. She also reports stopping amlodipine in the past due to muscle aches, though some time ago was placed back on it.  - Will stop amlodipine at this time - Continue irbesartan and HCTZ for now - Will check CMET in the next day or two when she comes back for fasting blood work for close monitoring of her kidney function - Instructed her to continue  to keep a log of her BP and notify the office if persistently >140/80  5. HLD: LDL 98 01/2020 - Will repeat FLP/LFTs at her convenience in 1-2 days for close monitoring since starting atorvastatin 01/2020 - Continue atorvastatin - may be contributing to he myalgias, though she thinks it is the amlodipine - Encouraged continued use of CoQ 10  6. OSA: compliant with CPAP - Continue CPAP   Current medicines are reviewed at length with the patient today.  The patient does not have concerns regarding medicines.  The following changes have been made:  As above  Labs/ tests ordered today include:   Orders Placed This Encounter  Procedures  . Comprehensive Metabolic Panel (CMET)  . CBC  . Lipid panel     Disposition:   FU with Dr. Fletcher Anon as planned and Dr. Debara Pickett in 1 year  Signed, Abigail Butts, PA-C  04/09/2020 9:39 AM

## 2020-04-09 ENCOUNTER — Other Ambulatory Visit: Payer: Self-pay

## 2020-04-09 ENCOUNTER — Encounter: Payer: Self-pay | Admitting: Medical

## 2020-04-09 ENCOUNTER — Ambulatory Visit (INDEPENDENT_AMBULATORY_CARE_PROVIDER_SITE_OTHER): Payer: Medicare Other | Admitting: Medical

## 2020-04-09 VITALS — BP 110/60 | HR 68 | Ht 62.5 in | Wt 134.4 lb

## 2020-04-09 DIAGNOSIS — Z9989 Dependence on other enabling machines and devices: Secondary | ICD-10-CM

## 2020-04-09 DIAGNOSIS — R0609 Other forms of dyspnea: Secondary | ICD-10-CM

## 2020-04-09 DIAGNOSIS — I10 Essential (primary) hypertension: Secondary | ICD-10-CM | POA: Diagnosis not present

## 2020-04-09 DIAGNOSIS — G4733 Obstructive sleep apnea (adult) (pediatric): Secondary | ICD-10-CM | POA: Diagnosis not present

## 2020-04-09 DIAGNOSIS — R0600 Dyspnea, unspecified: Secondary | ICD-10-CM

## 2020-04-09 DIAGNOSIS — I739 Peripheral vascular disease, unspecified: Secondary | ICD-10-CM | POA: Diagnosis not present

## 2020-04-09 DIAGNOSIS — I480 Paroxysmal atrial fibrillation: Secondary | ICD-10-CM

## 2020-04-09 DIAGNOSIS — E7800 Pure hypercholesterolemia, unspecified: Secondary | ICD-10-CM | POA: Diagnosis not present

## 2020-04-09 NOTE — Patient Instructions (Addendum)
Medication Instructions:  STOP- Amlodipine   *If you need a refill on your cardiac medications before your next appointment, please call your pharmacy*   Lab Work: CBC, CMP and Fasting Lipids  If you have labs (blood work) drawn today and your tests are completely normal, you will receive your results only by:  [place] (if you have MyChart) OR  A paper copy in the mail If you have any lab test that is abnormal or we need to change your treatment, we will call you to review the results.   Testing/Procedures: None Ordered   Follow-Up: At [facility], you and your health needs are our priority.  As part of our continuing mission to provide you with exceptional heart care, we have created designated Provider Care Teams.  These Care Teams include your primary Cardiologist (physician) and Advanced Practice Providers (APPs -  Physician Assistants and Nurse Practitioners) who all work together to provide you with the care you need, when you need it.  We recommend signing up for the patient portal called "MyChart".  Sign up information is provided on this After Visit Summary.  MyChart is used to connect with patients for Virtual Visits (Telemedicine).  Patients are able to view lab/test results, encounter notes, upcoming appointments, etc.  Non-urgent messages can be sent to your provider as well.   To learn more about what you can do with MyChart, go to [email or web address].    Your next appointment:   Keep appointment with Dr Fletcher Anon on 06/22 @ 8:40  The format for your next appointment:   In Person  Provider:    Dr Fletcher Anon   Other:  Low sodium diet  Daily blood pressure check If systolic blood pressure get above 140/80 give office a call

## 2020-04-11 DIAGNOSIS — I480 Paroxysmal atrial fibrillation: Secondary | ICD-10-CM | POA: Diagnosis not present

## 2020-04-11 DIAGNOSIS — E7800 Pure hypercholesterolemia, unspecified: Secondary | ICD-10-CM | POA: Diagnosis not present

## 2020-04-11 DIAGNOSIS — I10 Essential (primary) hypertension: Secondary | ICD-10-CM | POA: Diagnosis not present

## 2020-04-11 LAB — COMPREHENSIVE METABOLIC PANEL
ALT: 25 IU/L (ref 0–32)
AST: 24 IU/L (ref 0–40)
Albumin/Globulin Ratio: 2.3 — ABNORMAL HIGH (ref 1.2–2.2)
Albumin: 4.3 g/dL (ref 3.6–4.6)
Alkaline Phosphatase: 106 IU/L (ref 48–121)
BUN/Creatinine Ratio: 29 — ABNORMAL HIGH (ref 12–28)
BUN: 33 mg/dL — ABNORMAL HIGH (ref 8–27)
Bilirubin Total: 0.5 mg/dL (ref 0.0–1.2)
CO2: 24 mmol/L (ref 20–29)
Calcium: 9.1 mg/dL (ref 8.7–10.3)
Chloride: 99 mmol/L (ref 96–106)
Creatinine, Ser: 1.15 mg/dL — ABNORMAL HIGH (ref 0.57–1.00)
GFR calc Af Amer: 50 mL/min/{1.73_m2} — ABNORMAL LOW (ref >59)
GFR calc non Af Amer: 44 mL/min/{1.73_m2} — ABNORMAL LOW (ref >59)
Globulin, Total: 1.9 g/dL (ref 1.5–4.5)
Glucose: 82 mg/dL (ref 65–99)
Potassium: 5 mmol/L (ref 3.5–5.2)
Sodium: 136 mmol/L (ref 134–144)
Total Protein: 6.2 g/dL (ref 6.0–8.5)

## 2020-04-11 LAB — CBC
Hematocrit: 40.1 % (ref 34.0–46.6)
Hemoglobin: 13.9 g/dL (ref 11.1–15.9)
MCH: 33.1 pg — ABNORMAL HIGH (ref 26.6–33.0)
MCHC: 34.7 g/dL (ref 31.5–35.7)
MCV: 96 fL (ref 79–97)
Platelets: 382 10*3/uL (ref 150–450)
RBC: 4.2 x10E6/uL (ref 3.77–5.28)
RDW: 12.6 % (ref 11.7–15.4)
WBC: 6.1 10*3/uL (ref 3.4–10.8)

## 2020-04-11 LAB — LIPID PANEL
Chol/HDL Ratio: 2.2 ratio (ref 0.0–4.4)
Cholesterol, Total: 149 mg/dL (ref 100–199)
HDL: 69 mg/dL (ref >39)
LDL Chol Calc (NIH): 68 mg/dL (ref 0–99)
Triglycerides: 60 mg/dL (ref 0–149)
VLDL Cholesterol Cal: 12 mg/dL (ref 5–40)

## 2020-04-12 ENCOUNTER — Encounter: Payer: Self-pay | Admitting: Gastroenterology

## 2020-04-12 ENCOUNTER — Ambulatory Visit (INDEPENDENT_AMBULATORY_CARE_PROVIDER_SITE_OTHER): Payer: Medicare Other | Admitting: Gastroenterology

## 2020-04-12 VITALS — BP 100/60 | HR 75 | Ht 62.0 in | Wt 135.0 lb

## 2020-04-12 DIAGNOSIS — K5904 Chronic idiopathic constipation: Secondary | ICD-10-CM | POA: Diagnosis not present

## 2020-04-12 DIAGNOSIS — K439 Ventral hernia without obstruction or gangrene: Secondary | ICD-10-CM

## 2020-04-12 NOTE — Progress Notes (Signed)
Holly Benson    [id number]    11-Aug-1933  Primary Care Physician:Wile, Jesse Sans, MD  Referring Physician: Michael Boston, [address] [place],  [place] [address]   Chief complaint: Chronic constipation  HPI:  86 year old female with multiple comorbidities including hypertension, proximal A. fib, COPD, cardiomyopathy here for follow-up for chronic idiopathic constipation  She continues to have intermittent constipation, is currently on Linzess and MiraLAX, continues to have difficulty evacuating.  She has bowel movements almost daily.  If she does not take Linzess or MiraLAX she does not have a bowel movement.  No family history of colon cancer  No recent colonoscopy   Outpatient Encounter Medications as of 04/12/2020  Medication Sig  . acetaminophen (TYLENOL) 325 MG tablet Take 650 mg by mouth every 6 (six) hours as needed for moderate pain or headache.  Marland Kitchen amiodarone (PACERONE) 200 MG tablet Take 1 tablet (200 mg total) by mouth daily.  Marland Kitchen apixaban (ELIQUIS) 5 MG TABS tablet Take 1 tablet (5 mg total) by mouth 2 (two) times daily.  Marland Kitchen atorvastatin (LIPITOR) 40 MG tablet Take 1 tablet (40 mg total) by mouth daily.  . Carboxymethylcellulose Sodium (THERATEARS OP) Place 1 drop into both eyes daily as needed (dry eyes).  . cetirizine (ZYRTEC) 10 MG tablet Take 10 mg by mouth daily as needed for allergies.   . clonazePAM (KLONOPIN) 0.5 MG tablet Take 0.25 tablets by mouth at bedtime as needed (restless legs).   . clopidogrel (PLAVIX) 75 MG tablet Take 1 tablet (75 mg total) by mouth daily.  . Coenzyme Q10 (COQ-10) 100 MG CAPS Take 100 mg by mouth in the morning and at bedtime.  . hydrochlorothiazide (MICROZIDE) 12.5 MG capsule Take 12.5 mg by mouth daily.  . irbesartan (AVAPRO) 150 MG tablet Take 1 tablet (150 mg total) by mouth daily.  Marland Kitchen LINZESS 290 MCG CAPS capsule TAKE ONE CAPSULE BY MOUTH EVERY MORNING BEFORE BREAKFAST (Patient taking differently: Take 290 mcg by mouth  daily before breakfast. )  . Misc Natural Products (COLON CLEANSE) CAPS Take 4 capsules by mouth daily as needed (constipation).  . Multiple Vitamins-Minerals (ZINC PO) Take 1 tablet by mouth daily.  Marland Kitchen OVER THE COUNTER MEDICATION Take 1 tablet by mouth daily. drenatrophin pmg otc supplement  . polyethylene glycol (MIRALAX / GLYCOLAX) 17 g packet Take 17 g by mouth daily as needed for moderate constipation.  Marland Kitchen STIOLTO RESPIMAT 2.5-2.5 MCG/ACT AERS USE 2 INHALATIONS ORALLY   EVERY MORNING (Patient taking differently: Inhale 2 puffs into the lungs daily. )  . Wheat Dextrin (BENEFIBER) POWD Take 1 Dose by mouth 3 (three) times daily.   No facility-administered encounter medications on file as of 04/12/2020.    Allergies as of 04/12/2020 - Review Complete 04/12/2020  Allergen Reaction Noted  . Lovenox [enoxaparin sodium] Hives and Rash 08/02/2019  . Metoprolol  12/26/2016    Past Medical History:  Diagnosis Date  . Arrhythmia   . Arthritis   . Atrial fibrillation ([place])   . COPD (chronic obstructive pulmonary disease) ([place])   . Hyperlipidemia   . Hypertension   . Sleep apnea     Past Surgical History:  Procedure Laterality Date  . ABDOMINAL AORTOGRAM W/LOWER EXTREMITY N/A 03/21/2020   Procedure: ABDOMINAL AORTOGRAM W/ Bilateral LOWER EXTREMITY Runoff;  Surgeon: Wellington Hampshire, MD;  Location: [facility];  Service: Cardiovascular;  Laterality: N/A;  . ABDOMINAL HYSTERECTOMY    . arm  surgery Right    Broken arm and has a plate in it  . CATARACT EXTRACTION Bilateral   . COLONOSCOPY     [facility] than 10 years ago [facility]  . KNEE ARTHROSCOPY Right   . PERIPHERAL VASCULAR INTERVENTION Bilateral 03/21/2020   Procedure: PERIPHERAL VASCULAR INTERVENTION;  Surgeon: Wellington Hampshire, MD;  Location: [facility];  Service: Cardiovascular;  Laterality: Bilateral;  external iliac    Family History  Problem Relation Age of Onset  . Suicidality Father   . Stroke Maternal  Grandfather   . Colon cancer Neg Hx   . Esophageal cancer Neg Hx     Social History   Socioeconomic History  . Marital status: Widowed    Spouse name: Not on file  . Number of children: Not on file  . Years of education: Not on file  . Highest education level: Not on file  Occupational History  . Not on file  Tobacco Use  . Smoking status: Former Smoker    Packs/day: 1.50    Years: 30.00    Pack years: 45.00    Quit date: 11/04/1991    Years since quitting: 28.4  . Smokeless tobacco: Never Used  Substance and Sexual Activity  . Alcohol use: Yes    Comment: 1 glass of wine/ night  . Drug use: No  . Sexual activity: Not on file  Other Topics Concern  . Not on file  Social History Narrative  . Not on file   Social Determinants of Health   Financial Resource Strain:   . Difficulty of Paying Living Expenses:   Food Insecurity:   . Worried About Charity fundraiser in the Last Year:   . Arboriculturist in the Last Year:   Transportation Needs:   . Film/video editor (Medical):   Marland Kitchen Lack of Transportation (Non-Medical):   Physical Activity:   . Days of Exercise per Week:   . Minutes of Exercise per Session:   Stress:   . Feeling of Stress :   Social Connections:   . Frequency of Communication with Friends and Family:   . Frequency of Social Gatherings with Friends and Family:   . Attends Religious Services:   . Active Member of Clubs or Organizations:   . Attends Archivist Meetings:   Marland Kitchen Marital Status:   Intimate Partner Violence:   . Fear of Current or Ex-Partner:   . Emotionally Abused:   Marland Kitchen Physically Abused:   . Sexually Abused:       Review of systems: All other review of systems negative except as mentioned in the HPI.   Physical Exam: Vitals:   04/12/20 0952  BP: 100/60  Pulse: 75   Body mass index is 24.69 kg/m. Gen:      No acute distress HEENT:  sclera anicteric Abd:      Large ventral hernia; soft, non-tender; no palpable  masses, no distension Ext:    No edema Neuro: alert and oriented x 3 Psych: normal mood and affect  Data Reviewed:  Reviewed labs, radiology imaging, old records and pertinent past GI work up   Assessment and Plan/Recommendations:  86 year old female with multiple comorbidities including hypertension, COPD, sleep apnea, cardiomyopathy, proximal A. fib, large ventral hernia with chronic idiopathic constipation  Patient is reluctant to undergo colonoscopy to exclude any obstructive lesions or malignancy.  Large ventral hernia is likely playing a role, not a surgical candidate given her comorbidities and advanced  age. Advised patient that she will need to come to ER if she develops obstipation or abdominal pain with inability to reduce the hernia.  Provided samples of Trulance 3 mg daily, if no significant improvement advised her to continue with Linzess 290 micrograms daily and MiraLAX 1 capful daily with goal of 1-2 soft bowel movements daily  Return in 4 to 6 months or sooner if needed  This visit required >40 minutes of patient care (this includes precharting, chart review, review of results, face-to-face time used for counseling as well as treatment plan and follow-up. The patient was provided an opportunity to ask questions and all were answered. The patient agreed with the plan and demonstrated an understanding of the instructions.  Damaris Hippo , MD    CC: Michael Boston, MD

## 2020-04-12 NOTE — Patient Instructions (Addendum)
If you are age 84 or older, your body mass index should be between 23-30. Your Body mass index is 24.69 kg/m. If this is out of the aforementioned range listed, please consider follow up with your Primary Care Provider.  If you are age 19 or younger, your body mass index should be between 19-25. Your Body mass index is 24.69 kg/m. If this is out of the aformentioned range listed, please consider follow up with your Primary Care Provider.   Samples of Trulance 3mg  1 tablet daily, given as a trial.  Call us on Monday to let us know how it is working.    If not working well, please continue Linzess 270 mcg daily after breakfast.   CONTINUE: Miralax at bedtime daily as needed.  You will follow up in 4 -6 months.  I appreciate the opportunity to care for you. Thank you for choosing me and [facility],  Dr. Harl Bowie

## 2020-04-13 ENCOUNTER — Encounter: Payer: Self-pay | Admitting: Gastroenterology

## 2020-04-16 ENCOUNTER — Telehealth: Payer: Self-pay | Admitting: Internal Medicine

## 2020-04-16 NOTE — Telephone Encounter (Signed)
Spoke with Holly Benson, she saw [place] last week and she has not had any improvement since then and wanted to let her know. She reports days where midday her bp will drop low, she gets lightheaded and washed out. Bo 127/67, noon 101/59 or 96/53. The lightheadedness with last only seconds but the weakness can last up to 1 hour.  She reports puffiness in her ankles in the morning when she gets up and increased by the end of the day. Her SOB has not changed. She takes the HCTZ in the am and irbesartan in the pm. Answers regarding support hose answered. Aware dr Debara Pickett is out and will send for his and krista's review. Holly Benson agreed with this plan.

## 2020-04-16 NOTE — Telephone Encounter (Signed)
    Pt c/o swelling: STAT is pt has developed SOB within 24 hours  1) How much weight have you gained and in what time span? No  2) If swelling, where is the swelling located? Ankles and feet   3) Are you currently taking a fluid pill? Yes a week ago  4) Are you currently SOB? Not right now  5) Do you have a log of your daily weights (if so, list)? None  6) Have you gained 3 pounds in a day or 5 pounds in a week? No  7) Have you traveled recently? No  Pt said she received a call from Dr. Curt Bears and she would like to talk to Dr. Debara Pickett. She said she sent a mychart messages about the symptoms she is experiencing. She said her ankles is still swollen even after taking her fluid pill a week ago. Also her BP dropping in the middle of the day almost every day and she gets lightheaded and SOB mostly when she is moving around, when she is sitting down she is ok. She wanted to know what she needs to do  Please advise

## 2020-04-17 MED ORDER — IRBESARTAN 75 MG PO TABS: 75.0000 mg | ORAL_TABLET | Freq: Every day | ORAL | 3 refills | Status: DC

## 2020-04-17 NOTE — Telephone Encounter (Signed)
Patient advised of Daleen Snook PA's advice. Irbesartan 75mg  sent to [facility] pharmacy. Med list updated.

## 2020-04-17 NOTE — Telephone Encounter (Signed)
-----   Message from Abigail Butts, PA-C sent at 04/17/2020  2:12 PM EDT -----  Given worsening LE edema since her visit one week ago, lets decrease her irbesartan to 75mg  daily and increase HCTZ to 25mg  daily and see if that helps with her swelling and SOB. She has an appointment to see Dr. Fletcher Anon 04/24/20 which will be a good time to check in and re-evaluate symptoms. Agree with compression stockings. Hope this helps. Thanks!

## 2020-04-24 ENCOUNTER — Encounter: Payer: Self-pay | Admitting: Cardiovascular Disease

## 2020-04-24 ENCOUNTER — Ambulatory Visit (INDEPENDENT_AMBULATORY_CARE_PROVIDER_SITE_OTHER): Payer: Medicare Other | Admitting: Cardiovascular Disease

## 2020-04-24 ENCOUNTER — Other Ambulatory Visit: Payer: Self-pay

## 2020-04-24 VITALS — BP 138/74 | HR 66 | Ht 63.0 in | Wt 136.0 lb

## 2020-04-24 DIAGNOSIS — I739 Peripheral vascular disease, unspecified: Secondary | ICD-10-CM

## 2020-04-24 DIAGNOSIS — I10 Essential (primary) hypertension: Secondary | ICD-10-CM

## 2020-04-24 DIAGNOSIS — E785 Hyperlipidemia, unspecified: Secondary | ICD-10-CM

## 2020-04-24 DIAGNOSIS — I480 Paroxysmal atrial fibrillation: Secondary | ICD-10-CM | POA: Diagnosis not present

## 2020-04-24 DIAGNOSIS — R0989 Other specified symptoms and signs involving the circulatory and respiratory systems: Secondary | ICD-10-CM

## 2020-04-24 MED ORDER — CLOPIDOGREL BISULFATE 75 MG PO TABS: 75.0000 mg | ORAL_TABLET | Freq: Every day | ORAL | 1 refills | Status: DC

## 2020-04-24 NOTE — Patient Instructions (Signed)
Medication Instructions:  STOP Plavix after August 18/19th.  *If you need a refill on your cardiac medications before your next appointment, please call your pharmacy*   Lab Work: None ordered If you have labs (blood work) drawn today and your tests are completely normal, you will receive your results only by: Marland Kitchen MyChart Message (if you have MyChart) OR . A paper copy in the mail If you have any lab test that is abnormal or we need to change your treatment, we will call you to review the results.   Testing/Procedures: Your physician has requested that you have a carotid duplex. This test is an ultrasound of the carotid arteries in your neck. It looks at blood flow through these arteries that supply the brain with blood. Allow one hour for this exam. There are no restrictions or special instructions. This will take place at [address] [place], [address].   Follow-Up: At [facility], you and your health needs are our priority.  As part of our continuing mission to provide you with exceptional heart care, we have created designated Provider Care Teams.  These Care Teams include your primary Cardiologist (physician) and Advanced Practice Providers (APPs -  Physician Assistants and Nurse Practitioners) who all work together to provide you with the care you need, when you need it.  We recommend signing up for the patient portal called "MyChart".  Sign up information is provided on this After Visit Summary.  MyChart is used to connect with patients for Virtual Visits (Telemedicine).  Patients are able to view lab/test results, encounter notes, upcoming appointments, etc.  Non-urgent messages can be sent to your provider as well.   To learn more about what you can do with MyChart, go to [email or web address].    Your next appointment:   6 month(s)  The format for your next appointment:   In Person  Provider:   Kathlyn Sacramento, MD

## 2020-04-24 NOTE — Addendum Note (Signed)
Addended by: Ricci Barker on: 04/24/2020 09:05 AM   Modules accepted: Orders

## 2020-04-24 NOTE — Progress Notes (Signed)
Cardiology Office Note   Date:  04/24/2020   ID:  Holly Benson, DOB [birth year], MRN [id number]  PCP:  Michael Boston, MD  Cardiologist:  Dr. Debara Pickett  No chief complaint on file.     History of Present Illness: Holly Benson is a 86 y.o. female who is here today for follow-up visit regarding peripheral arterial disease.  She has known history of atrial fibrillation on long-term anticoagulation with Eliquis, essential hypertension, hyperlipidemia, anxiety and obstructive sleep apnea on CPAP.She is a previous smoker and has no history of diabetes She was seen recently for progressive bilateral leg pain and lower back pain claudication.   She underwent noninvasive vascular studies which showed an ABI of 0.88 on the right and 0.69 on the left.  Duplex showed monophasic waveforms starting in the common femoral artery with no evidence of infrainguinal disease.  Aortoiliac duplex showed severe bilateral external iliac artery stenosis and significant left common iliac artery stenosis. I proceeded with angiography in May which showed severe bilateral external iliac artery disease worse on the left side.  I performed successful drug-eluting self-expanding stent placement to bilateral external iliac arteries.  Postprocedure vascular studies showed improvement in ABI to the 0.9 range bilaterally.  Duplex showed patent external iliac artery stents was mildly elevated velocities.  There was borderline moderate disease in bilateral common iliac arteries. She reports significant improvement in symptoms since then especially on the left side.  She continues to complain of mild right hip and thigh discomfort.  No chest pain or shortness of breath.  Past Medical History:  Diagnosis Date  . Arrhythmia   . Arthritis   . Atrial fibrillation ([place])   . COPD (chronic obstructive pulmonary disease) ([place])   . Hyperlipidemia   . Hypertension   . Sleep apnea     Past Surgical History:  Procedure Laterality  Date  . ABDOMINAL AORTOGRAM W/LOWER EXTREMITY N/A 03/21/2020   Procedure: ABDOMINAL AORTOGRAM W/ Bilateral LOWER EXTREMITY Runoff;  Surgeon: Wellington Hampshire, MD;  Location: [facility];  Service: Cardiovascular;  Laterality: N/A;  . ABDOMINAL HYSTERECTOMY    . arm surgery Right    Broken arm and has a plate in it  . CATARACT EXTRACTION Bilateral   . COLONOSCOPY     More than 10 years ago [facility]  . KNEE ARTHROSCOPY Right   . PERIPHERAL VASCULAR INTERVENTION Bilateral 03/21/2020   Procedure: PERIPHERAL VASCULAR INTERVENTION;  Surgeon: Wellington Hampshire, MD;  Location: [facility];  Service: Cardiovascular;  Laterality: Bilateral;  external iliac     Current Outpatient Medications  Medication Sig Dispense Refill  . acetaminophen (TYLENOL) 325 MG tablet Take 650 mg by mouth every 6 (six) hours as needed for moderate pain or headache.    Marland Kitchen amiodarone (PACERONE) 200 MG tablet Take 1 tablet (200 mg total) by mouth daily. 90 tablet 3  . apixaban (ELIQUIS) 5 MG TABS tablet Take 1 tablet (5 mg total) by mouth 2 (two) times daily. 180 tablet 2  . atorvastatin (LIPITOR) 40 MG tablet Take 1 tablet (40 mg total) by mouth daily. 30 tablet 6  . Carboxymethylcellulose Sodium (THERATEARS OP) Place 1 drop into both eyes daily as needed (dry eyes).    . cetirizine (ZYRTEC) 10 MG tablet Take 10 mg by mouth daily as needed for allergies.     . clonazePAM (KLONOPIN) 0.5 MG tablet Take 0.25 tablets by mouth at bedtime as needed (restless legs).     Marland Kitchen  clopidogrel (PLAVIX) 75 MG tablet Take 1 tablet (75 mg total) by mouth daily. 30 tablet 3  . Coenzyme Q10 (COQ-10) 100 MG CAPS Take 100 mg by mouth in the morning and at bedtime.    Marland Kitchen doxycycline (DORYX) 100 MG EC tablet Take 100 mg by mouth 2 (two) times daily.    . hydrochlorothiazide (MICROZIDE) 12.5 MG capsule Take 25 mg by mouth daily.    . irbesartan (AVAPRO) 75 MG tablet Take 1 tablet (75 mg total) by mouth daily. 90 tablet 3  .  LINZESS 290 MCG CAPS capsule TAKE ONE CAPSULE BY MOUTH EVERY MORNING BEFORE BREAKFAST (Patient taking differently: Take 290 mcg by mouth daily before breakfast. ) 30 capsule 2  . Misc Natural Products (COLON CLEANSE) CAPS Take 4 capsules by mouth daily as needed (constipation).    . Multiple Vitamins-Minerals (ZINC PO) Take 1 tablet by mouth daily.    Marland Kitchen OVER THE COUNTER MEDICATION Take 1 tablet by mouth daily. drenatrophin pmg otc supplement    . polyethylene glycol (MIRALAX / GLYCOLAX) 17 g packet Take 17 g by mouth daily as needed for moderate constipation.    Marland Kitchen STIOLTO RESPIMAT 2.5-2.5 MCG/ACT AERS USE 2 INHALATIONS ORALLY   EVERY MORNING (Patient taking differently: Inhale 2 puffs into the lungs daily. ) 12 g 2  . Wheat Dextrin (BENEFIBER) POWD Take 1 Dose by mouth 3 (three) times daily.     No current facility-administered medications for this visit.    Allergies:   Lovenox [enoxaparin sodium] and Metoprolol    Social History:  The patient  reports that she quit smoking about 28 years ago. She has a 45.00 pack-year smoking history. She has never used smokeless tobacco. She reports current alcohol use. She reports that she does not use drugs.   Family History:  The patient's family history includes Stroke in her maternal grandfather; Suicidality in her father.    ROS:  Please see the history of present illness.   Otherwise, review of systems are positive for none.   All other systems are reviewed and negative.    PHYSICAL EXAM: VS:  BP 138/74   Pulse 66   Ht 5\' 3"  (1.6 m)   Wt 136 lb (61.7 kg)   SpO2 97%   BMI 24.09 kg/m  , BMI Body mass index is 24.09 kg/m. GEN: Well nourished, well developed, in no acute distress  HEENT: normal  Neck: no JVD,  or masses.  Right carotid bruit. Cardiac: RRR; no murmurs, rubs, or gallops,no edema  Respiratory:  clear to auscultation bilaterally, normal work of breathing GI: soft, nontender, nondistended, + BS MS: no deformity or atrophy    Skin: warm and dry, no rash Neuro:  Strength and sensation are intact Psych: euthymic mood, full affect Vascular: Femoral pulses +1 on the right and +2 on the left.  No groin hematoma.   EKG:  EKG is not ordered today.   Recent Labs: 08/24/2019: TSH 1.860 04/11/2020: ALT 25; BUN 33; Creatinine, Ser 1.15; Hemoglobin 13.9; Platelets 382; Potassium 5.0; Sodium 136    Lipid Panel    Component Value Date/Time   CHOL 149 04/11/2020 0838   TRIG 60 04/11/2020 0838   HDL 69 04/11/2020 0838   CHOLHDL 2.2 04/11/2020 0838   LDLCALC 68 04/11/2020 0838      Wt Readings from Last 3 Encounters:  04/24/20 136 lb (61.7 kg)  04/12/20 135 lb (61.2 kg)  04/09/20 134 lb 6.4 oz (61 kg)  PAD Screen 09/10/2017  Previous PAD dx? No  Previous surgical procedure? Yes  Dates of procedures arm 2014  Pain with walking? Yes  Subsides with rest? Yes  Feet/toe relief with dangling? No  Painful, non-healing ulcers? No  Extremities discolored? No      ASSESSMENT AND PLAN:  1.  Peripheral arterial disease: Status post recent bilateral external iliac artery stent placement with significant improvement in ABI and symptoms.   She has mild residual right hip and thigh discomfort which could be due to residual stenosis in the common iliac artery.  Peak velocity there was only 260.  Recommend continuing medical therapy.  Repeat ABI and aortoiliac duplex in December. I instructed her to discontinue clopidogrel in August given that she is on anticoagulation with Eliquis.  2.  Atrial fibrillation: Maintaining in sinus rhythm with amiodarone and she is on anticoagulation with Eliquis.  3.  Essential hypertension: Blood pressure is controlled on current medications.  4.  Hyperlipidemia: She was recently switched to atorvastatin and most recent lipid profile showed an LDL of 68.  5.  Right carotid bruit: I requested carotid Doppler.    Disposition:   FU with me in 6 months  Signed,  Kathlyn Sacramento, MD  04/24/2020 8:43 AM    [place]

## 2020-04-27 ENCOUNTER — Other Ambulatory Visit: Payer: Self-pay

## 2020-04-27 ENCOUNTER — Ambulatory Visit (HOSPITAL_COMMUNITY)
Admission: RE | Admit: 2020-04-27 | Discharge: 2020-04-27 | Disposition: A | Discharge status: Home / Self Care | Payer: Medicare Other | Source: Ambulatory Visit | Attending: Cardiology | Admitting: Cardiology

## 2020-04-27 DIAGNOSIS — R0989 Other specified symptoms and signs involving the circulatory and respiratory systems: Secondary | ICD-10-CM | POA: Insufficient documentation

## 2020-05-08 DIAGNOSIS — M25571 Pain in right ankle and joints of right foot: Secondary | ICD-10-CM | POA: Diagnosis not present

## 2020-05-11 ENCOUNTER — Other Ambulatory Visit: Payer: Self-pay | Admitting: *Deleted

## 2020-05-11 MED ORDER — HYDROCHLOROTHIAZIDE 25 MG PO TABS: 25.0000 mg | ORAL_TABLET | Freq: Every day | ORAL | 3 refills | Status: DC

## 2020-05-14 DIAGNOSIS — S50312A Abrasion of left elbow, initial encounter: Secondary | ICD-10-CM | POA: Diagnosis not present

## 2020-05-17 DIAGNOSIS — M25571 Pain in right ankle and joints of right foot: Secondary | ICD-10-CM | POA: Diagnosis not present

## 2020-05-31 ENCOUNTER — Other Ambulatory Visit: Payer: Self-pay | Admitting: Emergency Medicine

## 2020-06-06 ENCOUNTER — Telehealth: Payer: Self-pay | Admitting: *Deleted

## 2020-06-06 MED ORDER — LINACLOTIDE 290 MCG PO CAPS: ORAL_CAPSULE | ORAL | 2 refills | Status: DC

## 2020-06-06 NOTE — Telephone Encounter (Signed)
Patient will be out of town until the end of September wants enough refills to last until she comes back    Refills sent electronically to patients pharmacy

## 2020-06-25 DIAGNOSIS — K219 Gastro-esophageal reflux disease without esophagitis: Secondary | ICD-10-CM | POA: Diagnosis not present

## 2020-06-25 DIAGNOSIS — R1310 Dysphagia, unspecified: Secondary | ICD-10-CM | POA: Diagnosis not present

## 2020-06-28 DIAGNOSIS — K224 Dyskinesia of esophagus: Secondary | ICD-10-CM | POA: Diagnosis not present

## 2020-06-28 DIAGNOSIS — Q2546 Tortuous aortic arch: Secondary | ICD-10-CM | POA: Diagnosis not present

## 2020-06-28 DIAGNOSIS — K219 Gastro-esophageal reflux disease without esophagitis: Secondary | ICD-10-CM | POA: Diagnosis not present

## 2020-06-28 DIAGNOSIS — K228 Other specified diseases of esophagus: Secondary | ICD-10-CM | POA: Diagnosis not present

## 2020-07-04 DIAGNOSIS — H04123 Dry eye syndrome of bilateral lacrimal glands: Secondary | ICD-10-CM | POA: Diagnosis not present

## 2020-07-04 DIAGNOSIS — H353130 Nonexudative age-related macular degeneration, bilateral, stage unspecified: Secondary | ICD-10-CM | POA: Diagnosis not present

## 2020-07-04 DIAGNOSIS — H02889 Meibomian gland dysfunction of unspecified eye, unspecified eyelid: Secondary | ICD-10-CM | POA: Diagnosis not present

## 2020-07-04 DIAGNOSIS — H52222 Regular astigmatism, left eye: Secondary | ICD-10-CM | POA: Diagnosis not present

## 2020-07-04 DIAGNOSIS — H5213 Myopia, bilateral: Secondary | ICD-10-CM | POA: Diagnosis not present

## 2020-07-04 DIAGNOSIS — Z961 Presence of intraocular lens: Secondary | ICD-10-CM | POA: Diagnosis not present

## 2020-07-12 ENCOUNTER — Encounter: Payer: Self-pay | Admitting: Emergency Medicine

## 2020-07-12 ENCOUNTER — Other Ambulatory Visit: Payer: Self-pay

## 2020-07-12 ENCOUNTER — Ambulatory Visit (INDEPENDENT_AMBULATORY_CARE_PROVIDER_SITE_OTHER): Payer: Medicare Other | Admitting: Emergency Medicine

## 2020-07-12 DIAGNOSIS — R05 Cough: Secondary | ICD-10-CM

## 2020-07-12 DIAGNOSIS — J438 Other emphysema: Secondary | ICD-10-CM | POA: Diagnosis not present

## 2020-07-12 DIAGNOSIS — R059 Cough, unspecified: Secondary | ICD-10-CM

## 2020-07-12 MED ORDER — ALBUTEROL SULFATE HFA 108 (90 BASE) MCG/ACT IN AERS
2.0000 | INHALATION_SPRAY | Freq: Four times a day (QID) | RESPIRATORY_TRACT | 6 refills | Status: DC | PRN
Start: 2020-07-12 — End: 2022-03-10

## 2020-07-12 NOTE — Addendum Note (Signed)
Addended by: Vanessa Barbara on: 07/12/2020 11:47 AM   Modules accepted: Orders

## 2020-07-12 NOTE — Assessment & Plan Note (Signed)
Overall stable without any exacerbations or hospitalizations.  She does not have an albuterol inhaler right now was using it sparingly before it expired.  Tolerate Stiolto and is benefiting.  Plan to continue same.  I will refill her albuterol as well.  COVID-19 vaccine is up-to-date

## 2020-07-12 NOTE — Progress Notes (Signed)
Subjective:    Patient ID: Holly Benson, female    DOB: 10-Aug-1933, 86 y.o.   MRN: [id number]  COPD She complains of cough. There is no shortness of breath or wheezing. Pertinent negatives include no ear pain, fever, headaches, postnasal drip, rhinorrhea, sneezing, sore throat or trouble swallowing. Her past medical history is significant for COPD.   ROV 07/12/20 --86 year old former smoker with COPD, chronic cough in the setting of GERD and allergic rhinitis.  She also has atrial fibrillation and hypertension, obstructive sleep apnea-titration study 07/14/2019 reviewed, showed recommendations for servo-vent 15/5 with PS 4 cmH2O. She describes a lot of irritation in her throat, increased throat clearing. She had a bout of bad reflux recently that set her throat irritation off. She doesn't feel any reflux currently. May have been the inciting even to get cough started.  Currently managed on Stiolto, doesn't have an albuterol right now, was using very rarely. COVID vaccine up to date.  Remains on Zyrtec as needed - not taking currently, does use some homeopathic allergy meds.  Not on any GERD regimen, planning to follow with Dr Silverio Decamp.   Review of Systems  Constitutional: Negative for fever and unexpected weight change.  HENT: Negative for congestion, dental problem, ear pain, nosebleeds, postnasal drip, rhinorrhea, sinus pressure, sneezing, sore throat and trouble swallowing.   Eyes: Negative for redness and itching.  Respiratory: Positive for cough. Negative for chest tightness, shortness of breath and wheezing.   Cardiovascular: Negative for palpitations and leg swelling.  Gastrointestinal: Negative for nausea and vomiting.  Genitourinary: Negative for dysuria.  Musculoskeletal: Negative for joint swelling.  Skin: Negative for rash.  Neurological: Negative for headaches.  Hematological: Does not bruise/bleed easily.  Psychiatric/Behavioral: Negative for dysphoric mood. The patient is not  nervous/anxious.     Past Medical History:  Diagnosis Date  . Arrhythmia   . Arthritis   . Atrial fibrillation ([place])   . COPD (chronic obstructive pulmonary disease) ([place])   . Hyperlipidemia   . Hypertension   . Sleep apnea      Family History  Problem Relation Age of Onset  . Suicidality Father   . Stroke Maternal Grandfather   . Colon cancer Neg Hx   . Esophageal cancer Neg Hx      Social History   Socioeconomic History  . Marital status: Widowed    Spouse name: Not on file  . Number of children: Not on file  . Years of education: Not on file  . Highest education level: Not on file  Occupational History  . Not on file  Tobacco Use  . Smoking status: Former Smoker    Packs/day: 1.50    Years: 30.00    Pack years: 45.00    Quit date: 11/04/1991    Years since quitting: 28.7  . Smokeless tobacco: Never Used  Substance and Sexual Activity  . Alcohol use: Yes    Comment: 1 glass of wine/ night  . Drug use: No  . Sexual activity: Not on file  Other Topics Concern  . Not on file  Social History Narrative  . Not on file   Social Determinants of Health   Financial Resource Strain:   . Difficulty of Paying Living Expenses: Not on file  Food Insecurity:   . Worried About Charity fundraiser in the Last Year: Not on file  . Ran Out of Food in the Last Year: Not on file  Transportation Needs:   . Lack of Transportation (Medical): Not  on file  . Lack of Transportation (Non-Medical): Not on file  Physical Activity:   . Days of Exercise per Week: Not on file  . Minutes of Exercise per Session: Not on file  Stress:   . Feeling of Stress : Not on file  Social Connections:   . Frequency of Communication with Friends and Family: Not on file  . Frequency of Social Gatherings with Friends and Family: Not on file  . Attends Religious Services: Not on file  . Active Member of Clubs or Organizations: Not on file  . Attends Archivist Meetings: Not on file  .  Marital Status: Not on file  Intimate Partner Violence:   . Fear of Current or Ex-Partner: Not on file  . Emotionally Abused: Not on file  . Physically Abused: Not on file  . 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  No known TB  Allergies  Allergen Reactions  . Lovenox [Enoxaparin Sodium] Hives and Rash    PT STATES SHE BROKE OUT IN A RASH HEAD TO TOE AND LASTED ABOUT 3 WEEKS   . Metoprolol     Muscle aches     Outpatient Medications Prior to Visit  Medication Sig Dispense Refill  . acetaminophen (TYLENOL) 325 MG tablet Take 650 mg by mouth every 6 (six) hours as needed for moderate pain or headache.    Marland Kitchen amiodarone (PACERONE) 200 MG tablet Take 1 tablet (200 mg total) by mouth daily. 90 tablet 3  . apixaban (ELIQUIS) 5 MG TABS tablet Take 1 tablet (5 mg total) by mouth 2 (two) times daily. 180 tablet 2  . Carboxymethylcellulose Sodium (THERATEARS OP) Place 1 drop into both eyes daily as needed (dry eyes).    . cetirizine (ZYRTEC) 10 MG tablet Take 10 mg by mouth daily as needed for allergies.     . clonazePAM (KLONOPIN) 0.5 MG tablet Take 0.25 tablets by mouth at bedtime as needed (restless legs).     . Coenzyme Q10 (COQ-10) 100 MG CAPS Take 100 mg by mouth in the morning and at bedtime.    . hydrochlorothiazide (HYDRODIURIL) 25 MG tablet Take 1 tablet (25 mg total) by mouth daily. 90 tablet 3  . irbesartan (AVAPRO) 75 MG tablet Take 1 tablet (75 mg total) by mouth daily. 90 tablet 3  . linaclotide (LINZESS) 290 MCG CAPS capsule TAKE ONE CAPSULE BY MOUTH EVERY MORNING BEFORE BREAKFAST 30 capsule 2  . Misc Natural Products (COLON CLEANSE) CAPS Take 4 capsules by mouth daily as needed (constipation).    . Multiple Vitamins-Minerals (ZINC PO) Take 1 tablet by mouth daily.    Marland Kitchen OVER THE COUNTER MEDICATION Take 1 tablet by mouth daily. drenatrophin pmg otc supplement    . polyethylene glycol (MIRALAX / GLYCOLAX) 17 g packet Take  17 g by mouth daily as needed for moderate constipation.    Marland Kitchen STIOLTO RESPIMAT 2.5-2.5 MCG/ACT AERS USE 2 INHALATIONS ORALLY   EVERY MORNING 12 g 0  . Wheat Dextrin (BENEFIBER) POWD Take 1 Dose by mouth 3 (three) times daily.    Marland Kitchen atorvastatin (LIPITOR) 40 MG tablet Take 1 tablet (40 mg total) by mouth daily. 30 tablet 6  . doxycycline (DORYX) 100 MG EC tablet Take 100 mg by mouth 2 (two) times daily.     No facility-administered medications prior to visit.        Objective:   Physical Exam Vitals:   07/12/20  1119  BP: (!) 150/90  Pulse: 94  Temp: (!) 97.1 F (36.2 C)  TempSrc: Temporal  SpO2: 97%  Weight: 128 lb 12.8 oz (58.4 kg)  Height: 5\' 2"  (1.575 m)   Gen: Pleasant, thin woman, in no distress,  normal affect, mild kyphosis  ENT: No lesions,  mouth clear,  oropharynx clear, no postnasal drip  Neck: No JVD, no stridor  Lungs: No use of accessory muscles, clear bilaterally, no wheeze on forced exp  Cardiovascular: RRR, heart sounds normal, no murmur or gallops, no peripheral edema  Musculoskeletal: No deformities, no cyanosis or clubbing  Neuro: alert, non focal  Skin: Warm, no lesions or rashes      Assessment & Plan:  COPD (chronic obstructive pulmonary disease) (H[facility]) Overall stable without any exacerbations or hospitalizations.  She does not have an albuterol inhaler right now was using it sparingly before it expired.  Tolerate Stiolto and is benefiting.  Plan to continue same.  I will refill her albuterol as well.  COVID-19 vaccine is up-to-date  Cough Seem to have been set off by an episode of flaring GERD which she is usually able to control with diet.  She is planning to follow with gastroenterology.  Chronic rhinitis is now sustaining.  She is going to restart her Zyrtec every day at least through the fall allergy season.  If the cough does not subside then we could consider further work-up, chest x-ray, upper airway inspection, etc.  Baltazar Apo, MD,  PhD 07/12/2020, 11:35 AM [facility] [phone] or if no answer [phone]

## 2020-07-12 NOTE — Patient Instructions (Signed)
Please continue Stiolto 2 puffs once a day. We will refill your albuterol inhaler.  You can use this 2 puffs if needed for shortness of breath, chest tightness, wheezing. Go ahead and restart your Zyrtec once a day every day through the fall allergy season.  You can consider backing off and just using as needed during the off season. Follow with Dr. Silverio Decamp as planned COVID-19 vaccine is up-to-date Follow with Dr. Lamonte Sakai in 12 months or sooner if you have persistence of your cough or any other problems.

## 2020-07-12 NOTE — Assessment & Plan Note (Signed)
Seem to have been set off by an episode of flaring GERD which she is usually able to control with diet.  She is planning to follow with gastroenterology.  Chronic rhinitis is now sustaining.  She is going to restart her Zyrtec every day at least through the fall allergy season.  If the cough does not subside then we could consider further work-up, chest x-ray, upper airway inspection, etc.

## 2020-07-21 ENCOUNTER — Other Ambulatory Visit: Payer: Self-pay | Admitting: Cardiology

## 2020-07-23 ENCOUNTER — Ambulatory Visit: Payer: Medicare Other | Admitting: Neurology

## 2020-07-25 DIAGNOSIS — L720 Epidermal cyst: Secondary | ICD-10-CM | POA: Diagnosis not present

## 2020-07-25 DIAGNOSIS — Z85828 Personal history of other malignant neoplasm of skin: Secondary | ICD-10-CM | POA: Diagnosis not present

## 2020-07-25 DIAGNOSIS — L821 Other seborrheic keratosis: Secondary | ICD-10-CM | POA: Diagnosis not present

## 2020-07-25 DIAGNOSIS — L570 Actinic keratosis: Secondary | ICD-10-CM | POA: Diagnosis not present

## 2020-07-25 DIAGNOSIS — D0439 Carcinoma in situ of skin of other parts of face: Secondary | ICD-10-CM | POA: Diagnosis not present

## 2020-07-25 DIAGNOSIS — D485 Neoplasm of uncertain behavior of skin: Secondary | ICD-10-CM | POA: Diagnosis not present

## 2020-07-25 DIAGNOSIS — D1801 Hemangioma of skin and subcutaneous tissue: Secondary | ICD-10-CM | POA: Diagnosis not present

## 2020-07-26 ENCOUNTER — Encounter: Payer: Self-pay | Admitting: *Deleted

## 2020-07-26 ENCOUNTER — Other Ambulatory Visit: Payer: Self-pay | Admitting: *Deleted

## 2020-07-29 ENCOUNTER — Telehealth: Payer: Self-pay | Admitting: Medical

## 2020-07-29 ENCOUNTER — Other Ambulatory Visit: Payer: Self-pay | Admitting: Medical

## 2020-07-29 DIAGNOSIS — I10 Essential (primary) hypertension: Secondary | ICD-10-CM

## 2020-07-29 MED ORDER — HYDRALAZINE HCL 10 MG PO TABS: 10.0000 mg | ORAL_TABLET | Freq: Three times a day (TID) | ORAL | 1 refills | Status: DC

## 2020-07-29 NOTE — Telephone Encounter (Signed)
Patient called for high BP. She took irbesartan 75mg  last night and HCTZ this AM. After taking it BP was 204/110.About 9AM took an additional irbesartan and it was still high 201/101, HR 73-77. Says she is completely asymptomatic with no chest pain, sob ,HA, lightheadedness, dizziness, swelling. Says when this happens she normall takes an extra HCTZ and BP improves. Will send in an rx for Hydralazine 10 mg TID. Will notify Dr. Fletcher Anon. Patient said she would call tomorrow to be seen in the office.   Holly Benson. PA-C

## 2020-07-30 ENCOUNTER — Telehealth: Payer: Self-pay | Admitting: Internal Medicine

## 2020-07-30 ENCOUNTER — Encounter: Payer: Self-pay | Admitting: Neurology

## 2020-07-30 ENCOUNTER — Ambulatory Visit (INDEPENDENT_AMBULATORY_CARE_PROVIDER_SITE_OTHER): Payer: Medicare Other | Admitting: Neurology

## 2020-07-30 DIAGNOSIS — G4731 Primary central sleep apnea: Secondary | ICD-10-CM | POA: Diagnosis not present

## 2020-07-30 DIAGNOSIS — G2581 Restless legs syndrome: Secondary | ICD-10-CM | POA: Insufficient documentation

## 2020-07-30 DIAGNOSIS — I480 Paroxysmal atrial fibrillation: Secondary | ICD-10-CM

## 2020-07-30 DIAGNOSIS — G620 Drug-induced polyneuropathy: Secondary | ICD-10-CM

## 2020-07-30 DIAGNOSIS — D500 Iron deficiency anemia secondary to blood loss (chronic): Secondary | ICD-10-CM

## 2020-07-30 DIAGNOSIS — T462X5A Adverse effect of other antidysrhythmic drugs, initial encounter: Secondary | ICD-10-CM | POA: Insufficient documentation

## 2020-07-30 MED ORDER — GABAPENTIN 100 MG PO CAPS: ORAL_CAPSULE | ORAL | 5 refills | Status: DC

## 2020-07-30 MED ORDER — CLONAZEPAM 0.5 MG PO TABS: 0.2500 mg | ORAL_TABLET | Freq: Every evening | ORAL | 0 refills | Status: DC | PRN

## 2020-07-30 NOTE — Telephone Encounter (Signed)
Spoke with the pt and made her an appt 08/01/20 with Kerin Ransom PA prior to her going out of town 08/02/20.

## 2020-07-30 NOTE — Telephone Encounter (Signed)
Seems like the BP is still very high - except for the one reading last night. We probably need to see her back and adjust her meds, rather than further increasing her hydralazine.  Dr Lemmie Evens

## 2020-07-30 NOTE — Telephone Encounter (Signed)
Patient called stating that her BP was up, she called the doctor on call yesterday was prescribed hydrALAZINE (APRESOLINE) 10 MG tablet, she states it has come down some be not enough. Yesterday it was: 200/104 morning 201/101 noon 119/64 last night Today: 179/90, this was an hour after taking BP medication

## 2020-07-30 NOTE — Telephone Encounter (Signed)
Pt is returning call.  

## 2020-07-30 NOTE — Patient Instructions (Signed)

## 2020-07-30 NOTE — Progress Notes (Signed)
SLEEP MEDICINE CLINIC    Provider:  Larey Seat, MD  Primary Care Physician:  Michael Boston, [place] [address]     Referring Provider: Michael Boston, [place],  [place] [id number]          Chief Complaint according to patient   Patient presents with:    . New Patient (Initial Visit)     pt alone, rm 10. presents today for concerns of RLS. she has struggled with this for years. she states that she was started on clonazepam 0.5 mg several years ago and she would take 1/2 tablet every now and then. a 30 day script would last 6 mths. she states more recently this has worsened and finds she needs the whole tablet and using it more frequently. she asked PCP if there something else she can take for RLS they sent her here.       HISTORY OF PRESENT ILLNESS:  Holly Benson is a 86 y.o. year old  Caucasian female patient seen here upon a referral by PCP on 07/30/2020  for an evaluation of RLS, in the setting of treated OSA ( the patient is established with Dr Claiborne Billings as her sleep doctor, but has not been asked to follow up, can't remember meeting him in person).    Chief concern according to patient :   Holly Benson reports problems with insomnia created by restless legs, she has a feeling of having to rub or mesh her legs and she can hardly sit still.  She has undergone a femoral artery stent earlier this year so there was definitely a peripheral vascular disease, she had a humerus fracture on the right side in 2014 she underwent cataract surgeries and hysterectomy in the 1990s.  She has a longstanding history of hypertension, coronary artery and heart disease and atrial fibrillation she dates the onset of her cardiac problems to 2016.  She has been followed by Lyman Bishop, MD who referred her to the [facility] lab after her atrial fibrillation diagnosis which is paroxysmal atrial fibrillation.  At the time she was already on Eliquis, chronically  anticoagulated , has failed ca cannel blockers and toprol in rate  control-  taken  Amiodarone since . Dr Curt Bears recommneded medication changes -  Pravachol, Cozaar Microzide and Proventil inhaler.  She is taking Klonopin half milligram tablets. She had an unsuccesfull  Cardioversion-  The first sleep study at [facility] showed a sleep onset after 21 minutes with a sleep efficiency of 73.5% study was performed on 12-27-2018.  She had leg related arousals at index of 5.8/h so periodic limb movements definitely contributed to poor sleep.  She was diagnosed with moderate obstructive sleep apnea an AHI of 16.8 which is still actually mild, severe when she resumed supine sleep position.  There were also some central apnea noted and I would continue to see this as a complex sleep apnea rather than obstructive sleep apnea.  She returned for a CPAP titration which then had to switch to BiPAP modality on 2-28 2020.  Sleep efficiency improved.  She did not have periodic limb movements and related arousals as she was under CPAP and BiPAP.   Her EKG showed PVCs but no atrial fibrillation during the night.  CPAP had been initiated at 5 cm and was titrated up to 11 cmH2O when her AHI was 20.8/h.  BiPAP was then started at 12/8 and titrated to 14/10 but central  apneas were still present and the AHI was still 52/h.  She was given a full facemask during the titration.  Since she failed to respond to CPAP and BiPAP she returned for an ASV titration addressing the residual central apneas.  And an ASV pressure of 5 over 4/15 cmH2O her AHI was reduced to 1.1.  Central apneas were eliminated, interestingly snoring is now reported again.  There were no clinically significant periodic limb movements noted.  The setting for the ASV machine is an EPAP of minimum 5 a pressure support of minimum 4 maximum 15 and a breeze rate was set on auto there is no set rate for breathing backup.  She started treatment in October 2020 so not even a year  ago. She also has a diagnosis now of pulmonary hypertension which was not listed previously.  She reports now that she still has restless legs keeping her from sleeping she has struggled with atrial fibrillation and hypertension over the past year again.  Multiple medication adjustments have not yet controlled her blood pressure.  She does not have chest pain headaches or dizziness.  She has been fully vaccinated for Covid with 2 shots.      Family medical /sleep history: no  other family member on CPAP with OSA, insomnia, sleep walkers.    Social history:  Patient is a retired Licensed conveyancer,  and lives in a household with her daughter and the family-   Family status is widowed , with adult children, 8 grandchildren.  Tobacco use: quit 1993 .   ETOH use, Caffeine intake in form of Coffee( 1 cup in AM ) Soda( /) Tea ( /) or energy drinks. Regular exercise in form of walking.    Hobbies : reading .       Sleep habits are as follows: The patient's dinner time is between 5-7 PM. The patient goes to bed at 9-10 PM and continues to sleep for 2-3  hours, wakes for a long time after that- unclear about the trigger, the first time at 12-1 AM.   The preferred sleep position is laterally, with the support of 1-2 pillows. Dreams are reportedly rare. 6  AM is the usual rise time. The patient wakes up spontaneously.  She reports not feeling refreshed or restored in AM, with symptoms such as dry mouth , morning headaches rarely , and sinus pressure- and residual fatigue.  Naps are taken seldomly.   Review of Systems: Out of a complete 14 system review, the patient complains of only the following symptoms, and all other reviewed systems are negative.:  Fatigue, dizziness, low Bp - shortness of breath - COPD- sleepiness , snoring, fragmented sleep, Insomnia -no  delayed sleep onset because of restless legs- but have trouble to sleep through the night.     How likely are you to doze in the following  situations: 0 = not likely, 1 = slight chance, 2 = moderate chance, 3 = high chance   Sitting and Reading? Watching Television? Sitting inactive in a public place (theater or meeting)? As a passenger in a car for an hour without a break? Lying down in the afternoon when circumstances permit? Sitting and talking to someone? Sitting quietly after lunch without alcohol? In a car, while stopped for a few minutes in traffic?   Total = 2/ 24 points   FSS endorsed at 20/ 63 points.   Social History   Socioeconomic History  . Marital status: Widowed    Spouse name: Not on  file  . Number of children: 3  . Years of education: Not on file  . Highest education level: Not on file  Occupational History    Comment: retired Licensed conveyancer  Tobacco Use  . Smoking status: Former Smoker    Packs/day: 1.50    Years: 30.00    Pack years: 45.00    Quit date: 11/04/1991    Years since quitting: 28.7  . Smokeless tobacco: Never Used  Substance and Sexual Activity  . Alcohol use: Yes    Comment: 1 glass of wine/ night  . Drug use: No  . Sexual activity: Not on file  Other Topics Concern  . Not on file  Social History Narrative   Lives with daughter   Social Determinants of Health   Financial Resource Strain:   . Difficulty of Paying Living Expenses: Not on file  Food Insecurity:   . Worried About Charity fundraiser in the Last Year: Not on file  . Ran Out of Food in the Last Year: Not on file  Transportation Needs:   . Lack of Transportation (Medical): Not on file  . Lack of Transportation (Non-Medical): Not on file  Physical Activity:   . Days of Exercise per Week: Not on file  . Minutes of Exercise per Session: Not on file  Stress:   . Feeling of Stress : Not on file  Social Connections:   . Frequency of Communication with Friends and Family: Not on file  . Frequency of Social Gatherings with Friends and Family: Not on file  . Attends Religious Services: Not on file  . Active Member  of Clubs or Organizations: Not on file  . Attends Archivist Meetings: Not on file  . Marital Status: Not on file    Family History  Problem Relation Age of Onset  . Suicidality Father   . Stroke Maternal Grandfather   . Hypertension Sister   . Colon cancer Neg Hx   . Esophageal cancer Neg Hx     Past Medical History:  Diagnosis Date  . Arrhythmia   . Arthritis   . Atrial fibrillation ([place])   . Constipation    chronic  . COPD (chronic obstructive pulmonary disease) ([place])   . Hyperlipidemia   . Hypertension    pulmonary  . OSA on CPAP   . Osteoporosis   . RLS (restless legs syndrome)   . Sleep apnea     Past Surgical History:  Procedure Laterality Date  . ABDOMINAL AORTOGRAM W/LOWER EXTREMITY N/A 03/21/2020   Procedure: ABDOMINAL AORTOGRAM W/ Bilateral LOWER EXTREMITY [place];  Surgeon: Wellington Hampshire, MD;  Location: [facility];  Service: Cardiovascular;  Laterality: N/A;  . ABDOMINAL HYSTERECTOMY    . arm surgery Right    Broken arm and has a plate in it  . CATARACT EXTRACTION Bilateral   . COLONOSCOPY     [facility] than 10 [facility]  . KNEE ARTHROSCOPY Right   . PERIPHERAL VASCULAR INTERVENTION Bilateral 03/21/2020   Procedure: PERIPHERAL VASCULAR INTERVENTION;  Surgeon: Wellington Hampshire, MD;  Location: [facility];  Service: Cardiovascular;  Laterality: Bilateral;  external iliac     Current Outpatient Medications on File Prior to Visit  Medication Sig Dispense Refill  . acetaminophen (TYLENOL) 325 MG tablet Take 650 mg by mouth every 6 (six) hours as needed for moderate pain or headache.    . albuterol (VENTOLIN HFA) 108 (90 Base) MCG/ACT inhaler Inhale 2 puffs  into the lungs every 6 (six) hours as needed for wheezing or shortness of breath. 18 g 6  . amiodarone (PACERONE) 200 MG tablet Take 1 tablet (200 mg total) by mouth daily. 90 tablet 3  . apixaban (ELIQUIS) 5 MG TABS tablet Take 1 tablet (5 mg total) by mouth 2 (two)  times daily. 180 tablet 2  . Carboxymethylcellulose Sodium (THERATEARS OP) Place 1 drop into both eyes daily as needed (dry eyes).    . cetirizine (ZYRTEC) 10 MG tablet Take 10 mg by mouth daily as needed for allergies.     . clonazePAM (KLONOPIN) 0.5 MG tablet Take 0.25 tablets by mouth at bedtime as needed (restless legs).     . Coenzyme Q10 (COQ-10) 100 MG CAPS Take 100 mg by mouth in the morning and at bedtime.    . hydrALAZINE (APRESOLINE) 10 MG tablet Take 1 tablet (10 mg total) by mouth 3 (three) times daily. 90 tablet 1  . hydrochlorothiazide (HYDRODIURIL) 25 MG tablet Take 1 tablet (25 mg total) by mouth daily. 90 tablet 3  . irbesartan (AVAPRO) 75 MG tablet Take 1 tablet (75 mg total) by mouth daily. 90 tablet 3  . linaclotide (LINZESS) 290 MCG CAPS capsule TAKE ONE CAPSULE BY MOUTH EVERY MORNING BEFORE BREAKFAST 30 capsule 2  . Misc Natural Products (COLON CLEANSE) CAPS Take 4 capsules by mouth daily as needed (constipation).    . Multiple Vitamins-Minerals (ZINC PO) Take 1 tablet by mouth daily.    Marland Kitchen OVER THE COUNTER MEDICATION Take 1 tablet by mouth daily. drenatrophin pmg otc supplement    . polyethylene glycol (MIRALAX / GLYCOLAX) 17 g packet Take 17 g by mouth daily as needed for moderate constipation.    . pravastatin (PRAVACHOL) 40 MG tablet Take by mouth.    . STIOLTO RESPIMAT 2.5-2.5 MCG/ACT AERS USE 2 INHALATIONS ORALLY   EVERY MORNING 12 g 0  . Wheat Dextrin (BENEFIBER) POWD Take 1 Dose by mouth 3 (three) times daily.     No current facility-administered medications on file prior to visit.    Allergies  Allergen Reactions  . Lovenox [Enoxaparin Sodium] Hives and Rash    PT STATES SHE BROKE OUT IN A RASH HEAD TO TOE AND LASTED ABOUT 3 WEEKS   . Metoprolol     Muscle aches    Physical exam:  There were no vitals filed for this visit. There is no height or weight on file to calculate BMI.   Wt Readings from Last 3 Encounters:  07/12/20 128 lb 12.8 oz (58.4 kg)   04/24/20 136 lb (61.7 kg)  04/12/20 135 lb (61.2 kg)     Ht Readings from Last 3 Encounters:  07/12/20 5\' 2"  (1.575 m)  04/24/20 5\' 3"  (1.6 m)  04/12/20 5\' 2"  (1.575 m)      General: The patient is awake, alert and appears not in acute distress. The patient is well groomed. Head: Normocephalic, atraumatic. Neck is supple. Mallampati 1,  neck circumference:13 inches . Nasal airflow patent.  Retrognathia is not  seen.  Dental status: intact  Cardiovascular:  Regular rate and cardiac rhythm by pulse,  without distended neck veins. Respiratory: Lungs are clear to auscultation.  Skin:  With evidence of ankle edema. . Trunk: The patient's posture is erect.   Neurologic exam : The patient is awake and alert, oriented to place and time.   Memory subjective described as intact.  Attention span & concentration ability appears normal.  Speech is fluent,  without  dysarthria, dysphonia or aphasia.  Mood and affect are appropriate.   Cranial nerves: no loss of smell or taste reported  Pupils are equal and briskly reactive to light. Funduscopic exam deferred.   Extraocular movements in vertical and horizontal planes were intact and without nystagmus. No Diplopia. Visual fields by finger perimetry are intact. Hearing was intact to soft voice and finger rubbing.    Facial sensation intact to fine touch.  Facial motor strength is symmetric and tongue and uvula move midline.  Neck ROM : rotation, tilt and flexion extension were normal for age and shoulder shrug was symmetrical.    Motor exam:  Symmetric bulk, tone and ROM.   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  .   Sensory:  Fine touch, pinprick and vibration were tested and perceived as diminished over the left knee and ankles.  PAD . Decreased sensation in both feet to fine touch.  Proprioception tested in the upper extremities was normal. Carpal  tunnel bilaterally.    Coordination: Rapid alternating movements in the fingers/hands were of normal speed.  The Finger-to-nose maneuver was intact without evidence of ataxia  or tremor. There was dysmetria on the left.    Gait and station: Patient could rise unassisted from a seated position, walked with a cane " for balance" as assistive device.  Stance is of normal width/ base .  Toe and heel walk were deferred.  Deep tendon reflexes: in the  upper and lower extremities are symmetric and intact.  Babinski response was deferred .      After spending a total time of  60 minutes face to face and additional time for physical and neurologic examination, review of laboratory studies,  personal review of imaging studies, reports and results of other testing and review of referral information / records as far as provided in visit, I have established the following assessment; Mrs. Zoa Dowty has a complicated complex form of sleep apnea which is probably partially related at least in her history of congestive heart failure-COPD and also peripheral vascular disease it is feasible that her chemo receptors are not working as well and that hypoxemia which is measured through hypercapnia would not be detected easily.  She presented with a mix of central and obstructive sleep apnea and her baseline sleep study interpreted by Dr. Claiborne Billings, followed by a failure to respond to CPAP and BiPAP as central apneas emerged under treatment.  She was finally placed on ASV and I do not know how well she has responded to this it seems that the in lab titration was successful.  She is here to see me for restless legs but they have actually stopped bothering her at night she is not sure why she wakes up in the middle of the night that may be a myoclonic jerk but she is not aware of any pain she has shortness of breath in daytime not just at night, and she reports the irresistible urge to move whenever she sits still.  Though  it is not so much a sleep-related concern as a concern at rest.  During the night her periodic limb movements that were initially found have responded well to being treated with positive airway pressure or ASV.  Based on that, she is willing to see if she could replace the Klonopin which she has taken for over 2 decades at night with another nonaddictive nonhabit-forming medication.  I  would think that the best treatment may either be gabapentin which can also benefit neuropathy or we will try dopaminergic agonists twice a day allowing her to have hopefully better sleep as well as less restless legs in daytime.  Klonopin is of course also an anxiolytic so it will help with some insomnia problems and she may have more insomnia if it is replaced with a different medication.  To wean off gently I will ask her to break the health milligrams in half and take only 1/4 mg  1)  RLS is likely a result of PAD, DDD and neuropathic changes.  2) check ferritin.     My Plan is to proceed with:  1) wean off Klonopin gradually - 0.25 mg for the next month.  2) Start neurontin 200 mg at bedtime and if this would not be enough, we can use Requip bid low dose for daytime RLS. This is my plan B.  3) check ferritin.   I would like to thank [name] Jesse Sans, MD and Michael Boston, Md [address] [place],  [place] [address] for allowing me to meet with and to take care of this pleasant patient.   In short, Chanell Nadeau is presenting with RLS.  I plan to follow up either personally or through our NP within 3 month.  Electronically signed by: Larey Seat, MD 07/30/2020 2:13 PM  [facility] Board certified by The [employer] of Sleep Medicine and Diplomate of the [employer] of Sleep Medicine. Board certified In Neurology through the [place], Fellow of the [employer] of Neurology. Medical Director of [facility].

## 2020-07-30 NOTE — Telephone Encounter (Signed)
Pt called the MD on call yesterday re: her elevated BP and she had a "sinus headache" and the MD called her in Hydralazine 10 mg to be take TID... she took one last night and another this morning. She says that she is feeling well today.   Her BP now is 154/75 HR 72  Yesterday it was: 200/104 morning 201/101 noon 119/64 last night Today: 179/90, this was an hour after taking BP medication  Will forward to Dr. Debara Pickett per her request to see how he would like to manage her meds and BP going forward.

## 2020-07-30 NOTE — Telephone Encounter (Signed)
Left message for the pt to call back.

## 2020-07-31 ENCOUNTER — Other Ambulatory Visit (INDEPENDENT_AMBULATORY_CARE_PROVIDER_SITE_OTHER): Payer: Self-pay

## 2020-07-31 DIAGNOSIS — G620 Drug-induced polyneuropathy: Secondary | ICD-10-CM | POA: Diagnosis not present

## 2020-07-31 DIAGNOSIS — Z0289 Encounter for other administrative examinations: Secondary | ICD-10-CM

## 2020-07-31 DIAGNOSIS — T462X5A Adverse effect of other antidysrhythmic drugs, initial encounter: Secondary | ICD-10-CM | POA: Diagnosis not present

## 2020-07-31 DIAGNOSIS — I480 Paroxysmal atrial fibrillation: Secondary | ICD-10-CM | POA: Diagnosis not present

## 2020-07-31 DIAGNOSIS — G2581 Restless legs syndrome: Secondary | ICD-10-CM | POA: Diagnosis not present

## 2020-07-31 DIAGNOSIS — D500 Iron deficiency anemia secondary to blood loss (chronic): Secondary | ICD-10-CM | POA: Diagnosis not present

## 2020-08-01 ENCOUNTER — Other Ambulatory Visit: Payer: Self-pay

## 2020-08-01 ENCOUNTER — Ambulatory Visit (INDEPENDENT_AMBULATORY_CARE_PROVIDER_SITE_OTHER): Payer: Medicare Other | Admitting: Cardiology

## 2020-08-01 ENCOUNTER — Encounter: Payer: Self-pay | Admitting: Cardiology

## 2020-08-01 VITALS — BP 140/62 | HR 68 | Ht 63.0 in | Wt 128.0 lb

## 2020-08-01 DIAGNOSIS — J438 Other emphysema: Secondary | ICD-10-CM

## 2020-08-01 DIAGNOSIS — I10 Essential (primary) hypertension: Secondary | ICD-10-CM

## 2020-08-01 DIAGNOSIS — E782 Mixed hyperlipidemia: Secondary | ICD-10-CM

## 2020-08-01 DIAGNOSIS — I480 Paroxysmal atrial fibrillation: Secondary | ICD-10-CM

## 2020-08-01 DIAGNOSIS — I739 Peripheral vascular disease, unspecified: Secondary | ICD-10-CM

## 2020-08-01 DIAGNOSIS — G4731 Primary central sleep apnea: Secondary | ICD-10-CM

## 2020-08-01 DIAGNOSIS — Z7901 Long term (current) use of anticoagulants: Secondary | ICD-10-CM | POA: Diagnosis not present

## 2020-08-01 DIAGNOSIS — G4739 Other sleep apnea: Secondary | ICD-10-CM

## 2020-08-01 LAB — IRON AND TIBC
Iron Saturation: 17 % (ref 15–55)
Iron: 43 ug/dL (ref 27–139)
Total Iron Binding Capacity: 254 ug/dL (ref 250–450)
UIBC: 211 ug/dL (ref 118–369)

## 2020-08-01 LAB — CBC WITH DIFFERENTIAL/PLATELET
Basophils Absolute: 0 10*3/uL (ref 0.0–0.2)
Basos: 0 %
EOS (ABSOLUTE): 0 10*3/uL (ref 0.0–0.4)
Eos: 0 %
Hematocrit: 39.7 % (ref 34.0–46.6)
Hemoglobin: 14 g/dL (ref 11.1–15.9)
Immature Grans (Abs): 0 10*3/uL (ref 0.0–0.1)
Immature Granulocytes: 0 %
Lymphocytes Absolute: 0.9 10*3/uL (ref 0.7–3.1)
Lymphs: 12 %
MCH: 33.4 pg — ABNORMAL HIGH (ref 26.6–33.0)
MCHC: 35.3 g/dL (ref 31.5–35.7)
MCV: 95 fL (ref 79–97)
Monocytes Absolute: 0.9 10*3/uL (ref 0.1–0.9)
Monocytes: 11 %
Neutrophils Absolute: 5.8 10*3/uL (ref 1.4–7.0)
Neutrophils: 77 %
Platelets: 331 10*3/uL (ref 150–450)
RBC: 4.19 x10E6/uL (ref 3.77–5.28)
RDW: 13 % (ref 11.7–15.4)
WBC: 7.7 10*3/uL (ref 3.4–10.8)

## 2020-08-01 LAB — FERRITIN: Ferritin: 295 ng/mL — ABNORMAL HIGH (ref 15–150)

## 2020-08-01 NOTE — Assessment & Plan Note (Signed)
Recent bump in her B/P- Aldactone added Aug 2021. B/P is improving- check BMP before ay further adjustments are made

## 2020-08-01 NOTE — Patient Instructions (Addendum)
Medication Instructions:  Continue current medications  *If you need a refill on your cardiac medications before your next appointment, please call your pharmacy*   Lab Work: BMP Today  If you have labs (blood work) drawn today and your tests are completely normal, you will receive your results only by: Marland Kitchen MyChart Message (if you have MyChart) OR . A paper copy in the mail If you have any lab test that is abnormal or we need to change your treatment, we will call you to review the results.   Testing/Procedures: None Ordered   Follow-Up: At [facility], you and your health needs are our priority.  As part of our continuing mission to provide you with exceptional heart care, we have created designated Provider Care Teams.  These Care Teams include your primary Cardiologist (physician) and Advanced Practice Providers (APPs -  Physician Assistants and Nurse Practitioners) who all work together to provide you with the care you need, when you need it.  We recommend signing up for the patient portal called "MyChart".  Sign up information is provided on this After Visit Summary.  MyChart is used to connect with patients for Virtual Visits (Telemedicine).  Patients are able to view lab/test results, encounter notes, upcoming appointments, etc.  Non-urgent messages can be sent to your provider as well.   To learn more about what you can do with MyChart, go to [email or web address].    Your next appointment:   October 19th @ 11:15 am  The format for your next appointment:   Virtual  Provider:   You will see one of the following Advanced Practice Providers on your designated Care Team:    Kerin Ransom, [place]

## 2020-08-01 NOTE — Assessment & Plan Note (Signed)
Dr Arrda follows. S/p bilateral iliac PTA May 2021 

## 2020-08-01 NOTE — Assessment & Plan Note (Signed)
On statin Rx- LDL 68

## 2020-08-01 NOTE — Assessment & Plan Note (Signed)
NSR on Amiodarone- seen by Dr Camnitz April 2021- monitor reviewed, NSR- no change in Rx 

## 2020-08-01 NOTE — Progress Notes (Signed)
RLS work up. Elevated MCH and high Ferritin level, no indication of iron deficiency- iron saturation is low normal. Ferritin can be elevated as a sign of an inflammatory process. Marland Kitchen

## 2020-08-01 NOTE — Assessment & Plan Note (Signed)
CHADS2 VASc= 4, on Eliquis 

## 2020-08-01 NOTE — Progress Notes (Signed)
Cardiology Office Note:    Date:  08/01/2020   ID:  Holly Benson, DOB [birth year], MRN [id number]  PCP:  Michael Boston, MD  Cardiologist:  Pixie Casino, MD  Electrophysiologist:  Constance Haw, MD   Referring MD: Michael Boston, MD   No chief complaint on file.   History of Present Illness:    Holly Benson is a pleasant 86 y.o. female with a hx of vascular disease, status post bilateral iliac artery intervention by Dr. Fletcher Anon in May 2021, PAF on Eliquis and amiodarone, followed by Dr. Curt Bears, hypertension, dyslipidemia, followed by Dr. Debara Pickett, and sleep apnea and restless leg syndrome followed by Dr. Brett Fairy.  She is in the office today for B/P follow up.  She is in the office today for a blood pressure follow-up.  Recently her blood pressure has been labile at home, mainly running on the high side.  She was put on hydralazine 10 mg 3 times daily.  She did have some low readings at one point and her HCTZ was cut back to 12.5 mg a day, this was later bumped back up to 25 mg a day when her blood pressure again went up.  She brought readings of her blood pressures with her today.  Since the hydralazine is started the last 3 days have shown an improvement in her blood pressure.  Her systolic is running 213-086.  Diastolic seems to be running in the low 80s.  She denies any unusual chest pain or shortness of breath.  She is not had any recurrent atrial fibrillation, she does mention she did have some this past summer but this seems to have quieted down.  Past Medical History:  Diagnosis Date  . Arrhythmia   . Arthritis   . Atrial fibrillation ([place])   . Constipation    chronic  . COPD (chronic obstructive pulmonary disease) ([place])   . Hyperlipidemia   . Hypertension    pulmonary  . OSA on CPAP   . Osteoporosis   . RLS (restless legs syndrome)   . Sleep apnea     Past Surgical History:  Procedure Laterality Date  . ABDOMINAL AORTOGRAM W/LOWER EXTREMITY N/A 03/21/2020    Procedure: ABDOMINAL AORTOGRAM W/ Bilateral LOWER EXTREMITY [place];  Surgeon: Wellington Hampshire, MD;  Location: [facility];  Service: Cardiovascular;  Laterality: N/A;  . ABDOMINAL HYSTERECTOMY    . arm surgery Right    Broken arm and has a plate in it  . CATARACT EXTRACTION Bilateral   . COLONOSCOPY     [facility] than 10 [facility]  . KNEE ARTHROSCOPY Right   . PERIPHERAL VASCULAR INTERVENTION Bilateral 03/21/2020   Procedure: PERIPHERAL VASCULAR INTERVENTION;  Surgeon: Wellington Hampshire, MD;  Location: [facility];  Service: Cardiovascular;  Laterality: Bilateral;  external iliac    Current Medications: Current Meds  Medication Sig  . acetaminophen (TYLENOL) 325 MG tablet Take 650 mg by mouth every 6 (six) hours as needed for moderate pain or headache.  . albuterol (VENTOLIN HFA) 108 (90 Base) MCG/ACT inhaler Inhale 2 puffs into the lungs every 6 (six) hours as needed for wheezing or shortness of breath.  Marland Kitchen amiodarone (PACERONE) 200 MG tablet Take 1 tablet (200 mg total) by mouth daily.  Marland Kitchen apixaban (ELIQUIS) 5 MG TABS tablet Take 1 tablet (5 mg total) by mouth 2 (two) times daily.  . Carboxymethylcellulose Sodium (THERATEARS OP) Place 1 drop into both eyes daily as needed (dry  eyes).  . cetirizine (ZYRTEC) 10 MG tablet Take 10 mg by mouth daily as needed for allergies.   . clonazePAM (KLONOPIN) 0.5 MG tablet Take 0.5 tablets (0.25 mg total) by mouth at bedtime as needed (restless legs).  . Coenzyme Q10 (COQ-10) 100 MG CAPS Take 100 mg by mouth in the morning and at bedtime.  . hydrALAZINE (APRESOLINE) 10 MG tablet Take 1 tablet (10 mg total) by mouth 3 (three) times daily.  . hydrochlorothiazide (HYDRODIURIL) 25 MG tablet Take 1 tablet (25 mg total) by mouth daily.  . irbesartan (AVAPRO) 75 MG tablet Take 1 tablet (75 mg total) by mouth daily.  Marland Kitchen linaclotide (LINZESS) 290 MCG CAPS capsule TAKE ONE CAPSULE BY MOUTH EVERY MORNING BEFORE BREAKFAST  . Misc Natural  Products (COLON CLEANSE) CAPS Take 4 capsules by mouth daily as needed (constipation).  . Multiple Vitamins-Minerals (ZINC PO) Take 1 tablet by mouth daily.  Marland Kitchen OVER THE COUNTER MEDICATION Take 1 tablet by mouth daily. drenatrophin pmg otc supplement  . polyethylene glycol (MIRALAX / GLYCOLAX) 17 g packet Take 17 g by mouth daily as needed for moderate constipation.  . pravastatin (PRAVACHOL) 40 MG tablet Take by mouth.  . STIOLTO RESPIMAT 2.5-2.5 MCG/ACT AERS USE 2 INHALATIONS ORALLY   EVERY MORNING  . Wheat Dextrin (BENEFIBER) POWD Take 1 Dose by mouth 3 (three) times daily.     Allergies:   Lovenox [enoxaparin sodium] and Metoprolol   Social History   Socioeconomic History  . Marital status: Widowed    Spouse name: Not on file  . Number of children: 3  . Years of education: Not on file  . Highest education level: Not on file  Occupational History    Comment: retired Licensed conveyancer  Tobacco Use  . Smoking status: Former Smoker    Packs/day: 1.50    Years: 30.00    Pack years: 45.00    Quit date: 11/04/1991    Years since quitting: 28.7  . Smokeless tobacco: Never Used  Substance and Sexual Activity  . Alcohol use: Yes    Comment: 1 glass of wine/ night  . Drug use: No  . Sexual activity: Not on file  Other Topics Concern  . Not on file  Social History Narrative   Lives with daughter   Social Determinants of Health   Financial Resource Strain:   . Difficulty of Paying Living Expenses: Not on file  Food Insecurity:   . Worried About Charity fundraiser in the Last Year: Not on file  . Ran Out of Food in the Last Year: Not on file  Transportation Needs:   . Lack of Transportation (Medical): Not on file  . Lack of Transportation (Non-Medical): Not on file  Physical Activity:   . Days of Exercise per Week: Not on file  . Minutes of Exercise per Session: Not on file  Stress:   . Feeling of Stress : Not on file  Social Connections:   . Frequency of Communication with Friends  and Family: Not on file  . Frequency of Social Gatherings with Friends and Family: Not on file  . Attends Religious Services: Not on file  . Active Member of Clubs or Organizations: Not on file  . Attends Archivist Meetings: Not on file  . Marital Status: Not on file     Family History: The patient's family history includes Hypertension in her sister; Stroke in her maternal grandfather; Suicidality in her father. There is no history of Colon cancer  or Esophageal cancer.  ROS:   Please see the history of present illness.     All other systems reviewed and are negative.  EKGs/Labs/Other Studies Reviewed:    The following studies were reviewed today:  Carotid US 04/27/2020- mild carotid disease  Myoview 12/08/2018-  The left ventricular ejection fraction is hyperdynamic (>65%).  Nuclear stress EF: 71%.  There was downsloping of the ST segments in the inferolateral leads during infusion with no significan J point depression which rapidly resolved in early recovery.  The study is normal.  This is a low risk study.    Echo 11/18/2017- Study Conclusions   - Left ventricle: The cavity size was normal. Wall thickness was  increased in a pattern of mild LVH. Systolic function was normal.  The estimated ejection fraction was in the range of 60% to 65%.  Wall motion was normal; there were no regional wall motion  abnormalities. The study is not technically sufficient to allow  evaluation of LV diastolic function.  - Mitral valve: Mildly thickened leaflets . There was trivial  regurgitation.  - Left atrium: Moderately dilated.  - Right ventricle: The cavity size was mildly dilated.  - Right atrium: Moderately dilated.  - Tricuspid valve: There was moderate regurgitation.  - Pulmonary arteries: PA peak pressure: 51 mm Hg (S).  - Inferior vena cava: The vessel was dilated. The respirophasic  diameter changes were blunted (< 50%), consistent with elevated   central venous pressure.   Impressions:   - LVEF 60-65%, mild LVH, normal wall motion, trivial MR, moderate  biatrial enlargement, mild RVE, RVSP 51 mmHg, dilated IVC.   EKG:  EKG is not ordered today.  The ekg ordered 02/07/2020 demonstrates NSR- HR 64  Recent Labs: 08/24/2019: TSH 1.860 04/11/2020: ALT 25; BUN 33; Creatinine, Ser 1.15; Potassium 5.0; Sodium 136 07/31/2020: Hemoglobin 14.0; Platelets 331  Recent Lipid Panel    Component Value Date/Time   CHOL 149 04/11/2020 0838   TRIG 60 04/11/2020 0838   HDL 69 04/11/2020 0838   CHOLHDL 2.2 04/11/2020 0838   LDLCALC 68 04/11/2020 0838    Physical Exam:    VS:  BP 140/62   Pulse 68   Ht 5\' 3"  (1.6 m)   Wt 128 lb (58.1 kg)   SpO2 95%   BMI 22.67 kg/m     Wt Readings from Last 3 Encounters:  08/01/20 128 lb (58.1 kg)  07/12/20 128 lb 12.8 oz (58.4 kg)  04/24/20 136 lb (61.7 kg)     GEN: Well nourished, well developed in no acute distress HEENT: Normal NECK: No JVD; RCA bruit CARDIAC: RRR, no murmurs, rubs, gallops RESPIRATORY:  Clear to auscultation without rales, wheezing or rhonchi -decreased breath sounds Lt base ABDOMEN: Soft, non-tender, non-distended MUSCULOSKELETAL:  No edema; No deformity  SKIN: Warm and dry NEUROLOGIC:  Alert and oriented x 3 PSYCHIATRIC:  Normal affect   ASSESSMENT:    Essential hypertension Recent bump in her B/P- Aldactone added Aug 2021. B/P is improving- check BMP before ay further adjustments are made  PAF (paroxysmal atrial fibrillation) (HCC) NSR on Amiodarone- seen by Dr Curt Bears April 2021- monitor reviewed, NSR- no change in Rx  Mixed hyperlipidemia On statin Rx- LDL 68  PVD (peripheral vascular disease) ([place]) Dr Rocky Crafts follows. S/p bilateral iliac PTA May 2021  COPD (chronic obstructive pulmonary disease) (HCC) Followed by Dr Lamonte Sakai  Complex sleep apnea syndrome Dr Brett Fairy follows- on C-pap  Chronic anticoagulation CHADS2 VASc= 4, on Eliquis  PLAN:  Check BMP today.  She has a hard time taking hydralazine TID and I suggested she try BID.  Continue to monitor home B/P twice a day (after meds).  Virtual f/u with me in two weeks- consider increasing Avapro if needed or possibly add Norvasc.Marland Kitchen She has a history of Lopressor intolerance, hesitant to challenge her with a beta blocker.    Medication Adjustments/Labs and Tests Ordered: Current medicines are reviewed at length with the patient today.  Concerns regarding medicines are outlined above.  Orders Placed This Encounter  Procedures  . Basic Metabolic Panel (BMET)   No orders of the defined types were placed in this encounter.   Patient Instructions  Medication Instructions:  Continue current medications  *If you need a refill on your cardiac medications before your next appointment, please call your pharmacy*   Lab Work: BMP Today  If you have labs (blood work) drawn today and your tests are completely normal, you will receive your results only by: Marland Kitchen MyChart Message (if you have MyChart) OR . A paper copy in the mail If you have any lab test that is abnormal or we need to change your treatment, we will call you to review the results.   Testing/Procedures: None Ordered   Follow-Up: At [facility], you and your health needs are our priority.  As part of our continuing mission to provide you with exceptional heart care, we have created designated Provider Care Teams.  These Care Teams include your primary Cardiologist (physician) and Advanced Practice Providers (APPs -  Physician Assistants and Nurse Practitioners) who all work together to provide you with the care you need, when you need it.  We recommend signing up for the patient portal called "MyChart".  Sign up information is provided on this After Visit Summary.  MyChart is used to connect with patients for Virtual Visits (Telemedicine).  Patients are able to view lab/test results, encounter notes, upcoming  appointments, etc.  Non-urgent messages can be sent to your provider as well.   To learn more about what you can do with MyChart, go to [email or web address].    Your next appointment:   October 19th @ 11:15 am  The format for your next appointment:   Virtual  Provider:   You will see one of the following Advanced Practice Providers on your designated Care Team:    Kerin Ransom, PA-C             Signed, Kerin Ransom, [place]  08/01/2020 3:35 PM    [place]

## 2020-08-01 NOTE — Assessment & Plan Note (Signed)
Dr Dohmeier follows- on C-pap

## 2020-08-01 NOTE — Assessment & Plan Note (Signed)
Followed by Dr. Byrum.  

## 2020-08-02 ENCOUNTER — Telehealth: Payer: Self-pay | Admitting: Neurology

## 2020-08-02 LAB — BASIC METABOLIC PANEL
BUN/Creatinine Ratio: 21 (ref 12–28)
BUN: 21 mg/dL (ref 8–27)
CO2: 26 mmol/L (ref 20–29)
Calcium: 9.5 mg/dL (ref 8.7–10.3)
Chloride: 94 mmol/L — ABNORMAL LOW (ref 96–106)
Creatinine, Ser: 1.01 mg/dL — ABNORMAL HIGH (ref 0.57–1.00)
GFR calc Af Amer: 59 mL/min/{1.73_m2} — ABNORMAL LOW (ref >59)
GFR calc non Af Amer: 51 mL/min/{1.73_m2} — ABNORMAL LOW (ref >59)
Glucose: 111 mg/dL — ABNORMAL HIGH (ref 65–99)
Potassium: 4.1 mmol/L (ref 3.5–5.2)
Sodium: 134 mmol/L (ref 134–144)

## 2020-08-02 NOTE — Telephone Encounter (Signed)
-----   Message from Larey Seat, MD sent at 08/01/2020 10:15 AM EDT ----- RLS work up. Elevated MCH and high Ferritin level, no indication of iron deficiency- iron saturation is low normal. Ferritin can be elevated as a sign of an inflammatory process. Marland Kitchen

## 2020-08-02 NOTE — Telephone Encounter (Signed)
Called the patient to review the lab results. There was no answer. LVM advising a mychart message would be sent and she could either reply or call our office back.  **If pt calls back please advise that lab results looks good and didn't appear to have a iron deficiency.

## 2020-08-03 NOTE — Telephone Encounter (Signed)
Pt called and the message from [facility] was relayed.

## 2020-08-06 ENCOUNTER — Telehealth: Payer: Self-pay | Admitting: Internal Medicine

## 2020-08-06 ENCOUNTER — Encounter: Payer: Self-pay | Admitting: Physician Assistant

## 2020-08-06 ENCOUNTER — Ambulatory Visit (INDEPENDENT_AMBULATORY_CARE_PROVIDER_SITE_OTHER): Payer: Medicare Other | Admitting: Physician Assistant

## 2020-08-06 VITALS — BP 134/62 | HR 59 | Ht 62.0 in | Wt 126.4 lb

## 2020-08-06 DIAGNOSIS — K5904 Chronic idiopathic constipation: Secondary | ICD-10-CM

## 2020-08-06 MED ORDER — AMITIZA 24 MCG PO CAPS: 24.0000 ug | ORAL_CAPSULE | Freq: Two times a day (BID) | ORAL | 11 refills | Status: DC

## 2020-08-06 NOTE — Patient Instructions (Addendum)
If you are age 84 or older, your body mass index should be between 23-30. Your Body mass index is 23.12 kg/m. If this is out of the aforementioned range listed, please consider follow up with your Primary Care Provider.  If you are age 30 or younger, your body mass index should be between 19-25. Your Body mass index is 23.12 kg/m. If this is out of the aformentioned range listed, please consider follow up with your Primary Care Provider.   Do a bowel purge with Miralax and Gatorade.  STOP Linzess. START Amitiza 24 mcg 1 capsule twice daily.    Continue Miralax 17 grams daily in 8 ounces of water or juice.  Follow up with Dr. Silverio Decamp as needed.

## 2020-08-06 NOTE — Telephone Encounter (Signed)
Message routed to [facility]

## 2020-08-06 NOTE — Progress Notes (Signed)
Subjective:    Patient ID: Holly Benson, female    DOB: Mar 23, 1933, 86 y.o.   MRN: [id number]  HPI Holly Benson is a pleasant 86 year old white female, established with Dr. Silverio Decamp, who comes in today with complaints of persistent constipation. She was last seen in June 2021.  At that time continued on Linzess 290 mcg daily, and MiraLAX 17 g daily.  She was reluctant to undergo colonoscopy given comorbidities and advanced age. She had an episode while visiting family in [place] in August with what was determined to be an episode of acid reflux with chest pain.  Part of that work-up included a barium swallow.  We have yet to receive a copy of that, though have requested.  She is not having any current issues with heartburn or indigestion is not had any recurrent chest pain.  She is not on any acid blocker therapy at this time. She says that despite the Linzess daily and taking MiraLAX on most days she continues to have struggles with constipation.  She is generally taking a product called colon cleanse about 6 tablets/day and then takes Senokot S once or twice per month if that does not work. She will have 2 to 3 days between bowel movements sometimes, and then occasional episodes of multiple bowel movements per day with looser stools.  She is frustrated that despite all of these medications she is still constipated and is concerned about whether she may be hurting herself with all of the medications. She also had questions today about her ventral hernia.  She says she has noticed this over the past 3 years, does feel that it is gotten a bit larger.  She has only had 2 episodes over the past 3 years with pain at the hernia site which quickly resolved.  She generally wears an abdominal binder which she feels helps.  She says it rarely bothers her.  Review of Systems Pertinent positive and negative review of systems were noted in the above HPI section.  All other review of systems was otherwise  negative.  Outpatient Encounter Medications as of 08/06/2020  Medication Sig  . acetaminophen (TYLENOL) 325 MG tablet Take 650 mg by mouth every 6 (six) hours as needed for moderate pain or headache.  . albuterol (VENTOLIN HFA) 108 (90 Base) MCG/ACT inhaler Inhale 2 puffs into the lungs every 6 (six) hours as needed for wheezing or shortness of breath.  Marland Kitchen amiodarone (PACERONE) 200 MG tablet Take 1 tablet (200 mg total) by mouth daily.  Marland Kitchen apixaban (ELIQUIS) 5 MG TABS tablet Take 1 tablet (5 mg total) by mouth 2 (two) times daily.  . Carboxymethylcellulose Sodium (THERATEARS OP) Place 1 drop into both eyes daily as needed (dry eyes).  . cetirizine (ZYRTEC) 10 MG tablet Take 10 mg by mouth daily as needed for allergies.   . Coenzyme Q10 (COQ-10) 100 MG CAPS Take 100 mg by mouth in the morning and at bedtime.  . gabapentin (NEURONTIN) 100 MG capsule Take one at dinner time and another one at bedtime .  . hydrALAZINE (APRESOLINE) 10 MG tablet Take 1 tablet (10 mg total) by mouth 3 (three) times daily.  . hydrochlorothiazide (HYDRODIURIL) 25 MG tablet Take 1 tablet (25 mg total) by mouth daily.  . irbesartan (AVAPRO) 75 MG tablet Take 1 tablet (75 mg total) by mouth daily.  Marland Kitchen linaclotide (LINZESS) 290 MCG CAPS capsule TAKE ONE CAPSULE BY MOUTH EVERY MORNING BEFORE BREAKFAST  . Misc Natural Products (COLON CLEANSE) CAPS Take 4  capsules by mouth daily as needed (constipation).  . Multiple Vitamins-Minerals (ZINC PO) Take 1 tablet by mouth daily.  Marland Kitchen OVER THE COUNTER MEDICATION Take 1 tablet by mouth daily. drenatrophin pmg otc supplement  . polyethylene glycol (MIRALAX / GLYCOLAX) 17 g packet Take 17 g by mouth daily as needed for moderate constipation.  . pravastatin (PRAVACHOL) 40 MG tablet Take by mouth.  . STIOLTO RESPIMAT 2.5-2.5 MCG/ACT AERS USE 2 INHALATIONS ORALLY   EVERY MORNING  . Wheat Dextrin (BENEFIBER) POWD Take 1 Dose by mouth 3 (three) times daily.  . AMITIZA 24 MCG capsule Take 1  capsule (24 mcg total) by mouth 2 (two) times daily with a meal.  . [DISCONTINUED] clonazePAM (KLONOPIN) 0.5 MG tablet Take 0.5 tablets (0.25 mg total) by mouth at bedtime as needed (restless legs). (Patient not taking: Reported on 08/06/2020)   No facility-administered encounter medications on file as of 08/06/2020.   Allergies  Allergen Reactions  . Lovenox [Enoxaparin Sodium] Hives and Rash    PT STATES SHE BROKE OUT IN A RASH HEAD TO TOE AND LASTED ABOUT 3 WEEKS   . Metoprolol     Muscle aches   Patient Active Problem List   Diagnosis Date Noted  . PVD (peripheral vascular disease) ([place]) 08/01/2020  . Chronic anticoagulation 08/01/2020  . 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  . 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 Iron deficiency anemia due to chronic blood loss 07/30/2020  . RLS (restless legs syndrome) 07/30/2020  . Typical atrial flutter ([place]) 01/12/2018  . Cardiomyopathy ([place]) 11/10/2017  . Shortness of breath 11/10/2017  . Heart palpitations 09/10/2017  . PAF (paroxysmal atrial fibrillation) ([place]) 09/10/2017  . Essential hypertension 09/10/2017  . Mixed hyperlipidemia 09/10/2017  . COPD (chronic obstructive pulmonary disease) ([place]) 07/21/2017  . Cough 07/21/2017   Social History   Socioeconomic History  . Marital status: Widowed    Spouse name: Not on file  . Number of children: 3  . Years of education: Not on file  . Highest education level: Not on file  Occupational History  . Occupation: retired    Comment: retired Licensed conveyancer  Tobacco Use  . Smoking status: Former Smoker    Packs/day: 1.50    Years: 30.00    Pack years: 45.00    Quit date: 11/04/1991    Years since quitting: 28.7  . Smokeless tobacco: Never Used  Substance and Sexual Activity  . Alcohol use: Yes    Comment: 1 glass of wine/ night  . Drug use: No  . Sexual activity: Not on file  Other Topics Concern  . Not on file  Social History Narrative   Lives with daughter   Social  Determinants of Health   Financial Resource Strain:   . Difficulty of Paying Living Expenses: Not on file  Food Insecurity:   . Worried About Charity fundraiser in the Last Year: Not on file  . Ran Out of Food in the Last Year: Not on file  Transportation Needs:   . Lack of Transportation (Medical): Not on file  . Lack of Transportation (Non-Medical): Not on file  Physical Activity:   . Days of Exercise per Week: Not on file  . Minutes of Exercise per Session: Not on file  Stress:   . Feeling of Stress : Not on file  Social Connections:   . Frequency of Communication with Friends and Family: Not on file  . Frequency of Social Gatherings with Friends and Family: Not on  file  . Attends Religious Services: Not on file  . Active Member of Clubs or Organizations: Not on file  . Attends Archivist Meetings: Not on file  . Marital Status: Not on file  Intimate Partner Violence:   . Fear of Current or Ex-Partner: Not on file  . Emotionally Abused: Not on file  . Physically Abused: Not on file  . Sexually Abused: Not on file    Ms. Selk's family history includes Hypertension in her sister; Stroke in her maternal grandfather; Suicidality in her father.      Objective:    Vitals:   08/06/20 1450  BP: 134/62  Pulse: (!) 59  SpO2: 96%    Physical Exam Well-developed well-nourished elderly white female in no acute distress.  Pleasant height, Weight 126, BMI 23.1  HEENT; nontraumatic normocephalic, EOMI, PE RR LA, sclera anicteric. Oropharynx; not examined Neck; supple, no JVD Cardiovascular; regular rate and rhythm with S1-S2, no murmur rub or gallop Pulmonary; Clear bilaterally Abdomen; soft, nontender, nondistended, no palpable mass or hepatosplenomegaly, bowel sounds are active.  Does have a large ventral hernia to the right of the umbilicus, reducible Rectal; not done today Skin; benign exam, no jaundice rash or appreciable lesions Extremities; no clubbing  cyanosis or edema skin warm and dry Neuro/Psych; alert and oriented x4, grossly nonfocal mood and affect appropriate       Assessment & Plan:   #76 86 year old white female with chronic idiopathic constipation with persistent issues with constipation despite Linzess 290 mcg daily and MiraLAX daily in addition to colon cleanse and Senokot as needed. She does manage to have at least 3-4 bowel movements per week but has a sense of not passing much stool at a time despite above regimen.  #2 ventral hernia, large #3 history of atrial fibrillation-on Eliquis 4.  Hypertension 5.  COPD 6.  Complex sleep apnea 7.  Restless leg syndrome 8.  Peripheral arterial disease  Plan; we will give her a trial of Amitiza 24 mcg p.o. twice daily, prescription sent.  This will be in place of Linzess.  If she does not find the Amitiza helpful can go back to the Linzess 290 mcg daily. Advise she take the MiraLAX 17 g in 8 ounces of water every day. We will give her a bowel purge, with 7-day packets of MiraLAX in 64 ounces of Gatorade, then resume her usual regimen. Advised okay to use colon cleanse/senna product as needed. We discussed the ventral hernia, advised seeking medical attention through the emergency room should she develop any persistent pain at the hernia site, associated with nonreducible hernia and/or nausea vomiting. Patient is signed a release and will request a copy of her barium swallow, done in [facility] August 2021. Patient will follow up with Dr. Silverio Decamp myself as needed.  Kyran Whittier S Eagan Shifflett PA-C 08/06/2020   Cc: Michael Boston, MD

## 2020-08-06 NOTE — Telephone Encounter (Signed)
Patient is requesting to speak with Holly Benson. to disucss having CPAP pressure adjusted. Please call.

## 2020-08-08 ENCOUNTER — Telehealth: Payer: Self-pay | Admitting: *Deleted

## 2020-08-08 NOTE — Telephone Encounter (Signed)
Returned a call to patient. She informed me that she is having problems sleeping even with her CPAP. She saw Odis Luster, RT and it was recommended she get appointment to see Dr Claiborne Billings. Call will be sent to his scheduler to call her and give appointment.

## 2020-08-09 ENCOUNTER — Telehealth: Payer: Self-pay

## 2020-08-09 NOTE — Telephone Encounter (Signed)
Prior Authorization has been started for Amitiza

## 2020-08-10 MED ORDER — AMITIZA 24 MCG PO CAPS: 24.0000 ug | ORAL_CAPSULE | Freq: Two times a day (BID) | ORAL | 11 refills | Status: DC

## 2020-08-10 NOTE — Telephone Encounter (Signed)
Amitiza has been approved through 08/09/21

## 2020-08-13 NOTE — Progress Notes (Signed)
Reviewed and agree with documentation and assessment and plan. K. Veena Annastyn Silvey , MD   

## 2020-08-15 DIAGNOSIS — D0439 Carcinoma in situ of skin of other parts of face: Secondary | ICD-10-CM | POA: Diagnosis not present

## 2020-08-17 NOTE — Progress Notes (Deleted)
Cardiology Office Note Date:  08/17/2020  Patient ID:  Holly Benson, Holly Benson 11-27-1932, MRN [id number] PCP:  Michael Boston, MD  Cardiologist:  Dr. Debara Pickett Electrophysiologist: Dr. Curt Bears    Chief Complaint: *** 6 mo follow up  History of Present Illness: Holly Benson is a 86 y.o. female with history of HTN, HLD, mixed CM, OSA w/CPAP, COPD, PAfib, flutter, PVD s/p b/l iliac stenting may 2021.  She saw Dr. Curt Bears 10/2019, at that time she c/o some fatigue, palpitations particularly mornings, and her amiodarone increased 200mg  BID for a month then back to daily  She saw J. Cleaver, NP 12/07/19, his note lays out quite well her past few months of AFib, symptoms, and medication progression, mostly adjusting rate control drugs 2/2 bradycardia.  At his visit she was was in [place], noted pattern to her palpitations mainly mornings and sporadically in the evenings.  Discussed triggers, importance of sleep/CPAP use and minimizing ETOH, etc.     A number of subsequent my chart notes, noted after a massage she felt unusually fatigued, weak, noting HR 40's, her dilt was stopped, amiodarone continued, planned to keep her appt here.  I saw her march 2021 She comes in today all in all not feeling great. She feels like she is still having some AFib, aware of the palpitations and her AFib really sucks the energy out of her.  These episodes are typically fairly brief, usually in the mornings, lasting 10 minutes to at most a hour, once she had an episode that lasted several hours (the day after her covid vaccine). She denies any near syncope or syncope.  Has had a couple episodes when standig that she felt like she might need to sit.  These not necessarily new for her. She denies any CP, no bleeding or signs of bleeding with her Eliquis She did stop the diltiazem though resumed it only 3 days later because her BP was high 170's/90's She is not sure that she appreciated any obvious difference the 3 days off  it. She brings a log, writing often bieif dizzy spells,some days more then others, some with reports of HR 40's others 60's Some days she feels quite good as well. She said she woke this AM "as ussual" feeling fatigued, weak in general, but currently feels good.   She was in [place] with some junctional beats, her dilt was stopped and started on losartan.  Discussed possibly tachy/brady perhaps pacing needs in the future, planned to monitor to try and establish if her symptoms were associated with AF, brady and follow up with Dr. Curt Bears. Her monitor was all sinus avg HR 66, nocturnal rates 40's  She saw J. Cleaver, NP shortly afterwards, her ARB had been adjusted, mentioned she was very active participating in yoga, doing housework, errands, etc.  She saw Dr. Curt Bears April 2021, from an AFib perspective doing well off the dilt, was following with cards team for her BP  She was found to have PVD by K. Lawrence, [place], and referred to Dr. Fletcher Anon, underwent b/l external artery stenting 03/21/2020  *** symptoms, AF *** BP deferred to cards if possible *** eliquis, bleeding, dose   AFib Hx Unclear when diagnosed, known diagnoses for her when she moved her from [place] in 2018 AAD hx: Flecainide started Sep 2020 > despite converting to SR she had increased SOB on drug and was stopped. Amiodarone started Oct 2020 >> current   Past Medical History:  Diagnosis Date  . Arrhythmia   .  Arthritis   . Atrial fibrillation ([place])   . Constipation    chronic  . COPD (chronic obstructive pulmonary disease) ([place])   . Hyperlipidemia   . Hypertension    pulmonary  . OSA on CPAP   . Osteoporosis   . RLS (restless legs syndrome)   . Sleep apnea     Past Surgical History:  Procedure Laterality Date  . ABDOMINAL AORTOGRAM W/LOWER EXTREMITY N/A 03/21/2020   Procedure: ABDOMINAL AORTOGRAM W/ Bilateral LOWER EXTREMITY [place];  Surgeon: Wellington Hampshire, MD;  Location: [facility];  Service: Cardiovascular;   Laterality: N/A;  . ABDOMINAL HYSTERECTOMY    . arm surgery Right    Broken arm and has a plate in it  . CATARACT EXTRACTION Bilateral   . COLONOSCOPY     More than 10 years [facility]  . KNEE ARTHROSCOPY Right   . PERIPHERAL VASCULAR INTERVENTION Bilateral 03/21/2020   Procedure: PERIPHERAL VASCULAR INTERVENTION;  Surgeon: Wellington Hampshire, MD;  Location: [facility];  Service: Cardiovascular;  Laterality: Bilateral;  external iliac    Current Outpatient Medications  Medication Sig Dispense Refill  . acetaminophen (TYLENOL) 325 MG tablet Take 650 mg by mouth every 6 (six) hours as needed for moderate pain or headache.    . albuterol (VENTOLIN HFA) 108 (90 Base) MCG/ACT inhaler Inhale 2 puffs into the lungs every 6 (six) hours as needed for wheezing or shortness of breath. 18 g 6  . amiodarone (PACERONE) 200 MG tablet Take 1 tablet (200 mg total) by mouth daily. 90 tablet 3  . AMITIZA 24 MCG capsule Take 1 capsule (24 mcg total) by mouth 2 (two) times daily with a meal. 60 capsule 11  . apixaban (ELIQUIS) 5 MG TABS tablet Take 1 tablet (5 mg total) by mouth 2 (two) times daily. 180 tablet 2  . Carboxymethylcellulose Sodium (THERATEARS OP) Place 1 drop into both eyes daily as needed (dry eyes).    . cetirizine (ZYRTEC) 10 MG tablet Take 10 mg by mouth daily as needed for allergies.     . Coenzyme Q10 (COQ-10) 100 MG CAPS Take 100 mg by mouth in the morning and at bedtime.    . gabapentin (NEURONTIN) 100 MG capsule Take one at dinner time and another one at bedtime . 60 capsule 5  . hydrALAZINE (APRESOLINE) 10 MG tablet Take 1 tablet (10 mg total) by mouth 3 (three) times daily. 90 tablet 1  . hydrochlorothiazide (HYDRODIURIL) 25 MG tablet Take 1 tablet (25 mg total) by mouth daily. 90 tablet 3  . irbesartan (AVAPRO) 75 MG tablet Take 1 tablet (75 mg total) by mouth daily. 90 tablet 3  . Misc Natural Products (COLON CLEANSE) CAPS Take 4 capsules by mouth daily as needed  (constipation).    . Multiple Vitamins-Minerals (ZINC PO) Take 1 tablet by mouth daily.    Marland Kitchen OVER THE COUNTER MEDICATION Take 1 tablet by mouth daily. drenatrophin pmg otc supplement    . polyethylene glycol (MIRALAX / GLYCOLAX) 17 g packet Take 17 g by mouth daily as needed for moderate constipation.    . pravastatin (PRAVACHOL) 40 MG tablet Take by mouth.    . STIOLTO RESPIMAT 2.5-2.5 MCG/ACT AERS USE 2 INHALATIONS ORALLY   EVERY MORNING 12 g 0  . Wheat Dextrin (BENEFIBER) POWD Take 1 Dose by mouth 3 (three) times daily.     No current facility-administered medications for this visit.    Allergies:   Lovenox [enoxaparin  sodium] and Metoprolol   Social History:  The patient  reports that she quit smoking about 28 years ago. She has a 45.00 pack-year smoking history. She has never used smokeless tobacco. She reports current alcohol use. She reports that she does not use drugs.   Family History:  The patient's family history includes Hypertension in her sister; Stroke in her maternal grandfather; Suicidality in her father.  ROS:  Please see the history of present illness.    All other systems are reviewed and otherwise negative.   PHYSICAL EXAM:  VS:  There were no vitals taken for this visit. BMI: There is no height or weight on file to calculate BMI. Well nourished, well developed, in no acute distress  HEENT: normocephalic, atraumatic  Neck: no JVD, carotid bruits or masses Cardiac:  *** RRR; no significant murmurs, no rubs, or gallops Lungs:  *** CTA b/l, no wheezing, rhonchi or rales  Abd: soft, nontender MS: no deformity, *** age appropriate atrophy Ext: *** no edema  Skin: warm and dry, no rash Neuro:  No gross deficits appreciated Psych: euthymic mood, full affect    EKG not done today  April 2021, [facility] AT Max 96 bpm 09:03am, 03/18 Min 48 bpm 04:29am, 03/20 Avg 66 bpm Less than 1% PACs and PVCs No atrial fibrillation noted Predominant rhythm was sinus  rhythm Symptoms associated with sinus rhythm  Echocardiogram 11/18/2017 Study Conclusions  - Left ventricle: The cavity size was normal. Wall thickness was  increased in a pattern of mild LVH. Systolic function was normal.  The estimated ejection fraction was in the range of 60% to 65%.  Wall motion was normal; there were no regional wall motion  abnormalities. The study is not technically sufficient to allow  evaluation of LV diastolic function.  - Mitral valve: Mildly thickened leaflets . There was trivial  regurgitation.  - Left atrium: Moderately dilated.  - Right ventricle: The cavity size was mildly dilated.  - Right atrium: Moderately dilated.  - Tricuspid valve: There was moderate regurgitation.  - Pulmonary arteries: PA peak pressure: 51 mm Hg (S).  - Inferior vena cava: The vessel was dilated. The respirophasic  diameter changes were blunted (< 50%), consistent with elevated  central venous pressure.  Cardiac event monitor 03/10/2018 Sinus Bradycardia / Sinus Rhythm / Sinus Arrhythmia/ Sinus Tachycardia, with intermittent First Degree AV Block, ? Junctional Rhythm and 2 pauses over 2 seconds - this appears to be 2nd degree type II AV block. PVC's, and PAC's noted. Ventricular Couplets, Trigeminy. Atrial Pairs, Runs, Trigeminy. Diary entries correlate with PVC's, and PAC's. No atrial flutter was noted.  Recent Labs: 08/24/2019: TSH 1.860 04/11/2020: ALT 25 07/31/2020: Hemoglobin 14.0; Platelets 331 08/01/2020: BUN 21; Creatinine, Ser 1.01; Potassium 4.1; Sodium 134  04/11/2020: Chol/HDL Ratio 2.2; Cholesterol, Total 149; HDL 69; LDL Chol Calc (NIH) 68; Triglycerides 60   Estimated Creatinine Clearance: 32.2 mL/min (A) (by C-G formula based on SCr of 1.01 mg/dL (H)).   Wt Readings from Last 3 Encounters:  08/06/20 126 lb 6.4 oz (57.3 kg)  08/01/20 128 lb (58.1 kg)  07/12/20 128 lb 12.8 oz (58.4 kg)     Other studies reviewed: Additional studies/records  reviewed today include: summarized above  ASSESSMENT AND PLAN:  1. Paroxysmal AFib/flutter is also mentioned     CHA2DS2Vasc is 4, on Eliquis *** appropriately dosed for weight/creat      2. HTN    ***  3. PVD     C/w cardiology/ team, Dr.  Arida       Disposition: ***   Current medicines are reviewed at length with the patient today.  The patient did not have any concerns regarding medicines.  Venetia Night, PA-C 08/17/2020 8:44 AM     [address] [place] [id number] [phone] (office)  [phone] (fax)

## 2020-08-20 ENCOUNTER — Other Ambulatory Visit: Payer: Self-pay

## 2020-08-20 ENCOUNTER — Ambulatory Visit: Payer: Medicare Other | Admitting: Physician Assistant

## 2020-08-20 ENCOUNTER — Ambulatory Visit (INDEPENDENT_AMBULATORY_CARE_PROVIDER_SITE_OTHER): Payer: Medicare Other | Admitting: Cardiovascular Disease

## 2020-08-20 ENCOUNTER — Other Ambulatory Visit: Payer: Self-pay | Admitting: Adult Health

## 2020-08-20 ENCOUNTER — Encounter: Payer: Self-pay | Admitting: Cardiovascular Disease

## 2020-08-20 DIAGNOSIS — G2581 Restless legs syndrome: Secondary | ICD-10-CM

## 2020-08-20 DIAGNOSIS — I10 Essential (primary) hypertension: Secondary | ICD-10-CM | POA: Diagnosis not present

## 2020-08-20 DIAGNOSIS — G4731 Primary central sleep apnea: Secondary | ICD-10-CM

## 2020-08-20 DIAGNOSIS — J438 Other emphysema: Secondary | ICD-10-CM | POA: Diagnosis not present

## 2020-08-20 DIAGNOSIS — G4733 Obstructive sleep apnea (adult) (pediatric): Secondary | ICD-10-CM

## 2020-08-20 DIAGNOSIS — Z7901 Long term (current) use of anticoagulants: Secondary | ICD-10-CM

## 2020-08-20 DIAGNOSIS — I480 Paroxysmal atrial fibrillation: Secondary | ICD-10-CM

## 2020-08-20 DIAGNOSIS — I272 Pulmonary hypertension, unspecified: Secondary | ICD-10-CM

## 2020-08-20 MED ORDER — HYDRALAZINE HCL 10 MG PO TABS: 20.0000 mg | ORAL_TABLET | Freq: Two times a day (BID) | ORAL | 3 refills | Status: DC

## 2020-08-20 NOTE — Progress Notes (Signed)
Cardiology Office Note    Date:  08/24/2020   ID:  Holly Benson, DOB 1932-12-31, MRN [id number]  PCP:  Michael Boston, MD  Cardiologist:  Shelva Majestic, MD (sleep), Dr. Debara Pickett  New sleep evaluation  History of Present Illness:  Holly Benson is a 86 y.o. female who is followed by Dr. Debara Pickett and has a history of hypertension, atrial fibrillation, status post DC cardioversion, and is on chronic anticoagulation with Eliquis.  She has a history of COPD as well as blood pressure lability and shortness of breath.  Apparently, she was initially referred for a sleep study due to concerns for sleep apnea and underwent a  sleep study initially on December 27, 2018.  This revealed sleep apnea with an overall AHI of 16.8/h.  Sleep apnea was very severe with supine posture with an AHI of 87.3/h.  She had oxygen desaturation to a nadir of 82% and had moderate snoring.  She also had increased periodic leg movements of sleep with an index of 17.8 and associated arousal with leg movement index at 5.8.  She was also found to have mild central sleep apnea with a CAI of 8.9.  She had a follow-up study which showed an AHI of 25.9 she was started on CPAP therapy and her AHI at 11 cm was 20.8.  Due to continued events she was started on BiPAP therapy and central events continued throughout the study such that her AHI at 13/9 was 11.6 and at 14/10 was 52/h.  With therapy she did not have significant periodic limb movements of sleep.  Due to her complex sleep apnea she ultimately underwent ASV titration on July 14, 2019 and initial trial with an EPAP minimum of 5, pressure support range of 4-15, and an auto breath rate was prescribed.  For some reason, the patient never had a sleep visit to see me after her above studies and apparently she had had some issues with calling our sleep coordinator regarding continued poor sleep and only sleeping 3 to 4 hours per night because the ASV machine was blowing hard.  Again for some  reason she was never placed on my schedule.  Recently, she was seen by Dr. Asencion Partridge Dohmeier for restless legs which may be contributing to her sleep disturbance.  It was recommended that she ultimately wean and discontinue her Klonopin gradually and she was started on Neurontin 200 mg at bedtime with a plan be to use Requip at low dose for daytime RLS.  The patient presents to my office today stating that she often can fall asleep well but she continues to have issues with sleep maintenance.  She goes to bed between 9 and 10 PM and wakes up between 5 and 6 AM.  She has nocturia 0-1 time per night.  Oftentimes she may wake up between 2 and 3 AM and have some difficulty falling back to sleep.  She presents for evaluation.  Past Medical History:  Diagnosis Date  . Arrhythmia   . Arthritis   . Atrial fibrillation ([place])   . Constipation    chronic  . COPD (chronic obstructive pulmonary disease) ([place])   . Hyperlipidemia   . Hypertension    pulmonary  . OSA on CPAP   . Osteoporosis   . RLS (restless legs syndrome)   . Sleep apnea     Past Surgical History:  Procedure Laterality Date  . ABDOMINAL AORTOGRAM W/LOWER EXTREMITY N/A 03/21/2020   Procedure: ABDOMINAL AORTOGRAM W/ Bilateral LOWER EXTREMITY [place];  Surgeon: Wellington Hampshire, MD;  Location: [facility];  Service: Cardiovascular;  Laterality: N/A;  . ABDOMINAL HYSTERECTOMY    . arm surgery Right    Broken arm and has a plate in it  . CATARACT EXTRACTION Bilateral   . COLONOSCOPY     More than 10 years ago In [facility]  . KNEE ARTHROSCOPY Right   . PERIPHERAL VASCULAR INTERVENTION Bilateral 03/21/2020   Procedure: PERIPHERAL VASCULAR INTERVENTION;  Surgeon: Wellington Hampshire, MD;  Location: [facility];  Service: Cardiovascular;  Laterality: Bilateral;  external iliac    Current Medications: Outpatient Medications Prior to Visit  Medication Sig Dispense Refill  . acetaminophen (TYLENOL) 325 MG tablet Take 650 mg  by mouth every 6 (six) hours as needed for moderate pain or headache.    . albuterol (VENTOLIN HFA) 108 (90 Base) MCG/ACT inhaler Inhale 2 puffs into the lungs every 6 (six) hours as needed for wheezing or shortness of breath. 18 g 6  . amiodarone (PACERONE) 200 MG tablet Take 1 tablet (200 mg total) by mouth daily. 90 tablet 3  . amLODipine (NORVASC) 5 MG tablet Take 5 mg by mouth daily.    Marland Kitchen apixaban (ELIQUIS) 5 MG TABS tablet Take 1 tablet (5 mg total) by mouth 2 (two) times daily. 180 tablet 2  . Carboxymethylcellulose Sodium (THERATEARS OP) Place 1 drop into both eyes daily as needed (dry eyes).    . cetirizine (ZYRTEC) 10 MG tablet Take 10 mg by mouth daily as needed for allergies.     Marland Kitchen clopidogrel (PLAVIX) 75 MG tablet Take 75 mg by mouth daily.    . Coenzyme Q10 (COQ-10) 100 MG CAPS Take 100 mg by mouth in the morning and at bedtime.    . gabapentin (NEURONTIN) 100 MG capsule Take one at dinner time and another one at bedtime . 60 capsule 5  . hydrochlorothiazide (HYDRODIURIL) 25 MG tablet Take 1 tablet (25 mg total) by mouth daily. 90 tablet 3  . irbesartan (AVAPRO) 75 MG tablet Take 1 tablet (75 mg total) by mouth daily. 90 tablet 3  . Misc Natural Products (COLON CLEANSE) CAPS Take 4 capsules by mouth daily as needed (constipation).    . Multiple Vitamins-Minerals (ZINC PO) Take 1 tablet by mouth daily.    Marland Kitchen OVER THE COUNTER MEDICATION Take 1 tablet by mouth daily. drenatrophin pmg otc supplement    . polyethylene glycol (MIRALAX / GLYCOLAX) 17 g packet Take 17 g by mouth daily as needed for moderate constipation.    . pravastatin (PRAVACHOL) 40 MG tablet Take by mouth.    . Wheat Dextrin (BENEFIBER) POWD Take 1 Dose by mouth 3 (three) times daily.    . AMITIZA 24 MCG capsule Take 1 capsule (24 mcg total) by mouth 2 (two) times daily with a meal. 60 capsule 11  . hydrALAZINE (APRESOLINE) 10 MG tablet Take 1 tablet (10 mg total) by mouth 3 (three) times daily. 90 tablet 1  . STIOLTO  RESPIMAT 2.5-2.5 MCG/ACT AERS USE 2 INHALATIONS ORALLY   EVERY MORNING 12 g 0   No facility-administered medications prior to visit.     Allergies:   Lovenox [enoxaparin sodium] and Metoprolol   Social History   Socioeconomic History  . Marital status: Widowed    Spouse name: Not on file  . Number of children: 3  . Years of education: Not on file  . Highest education level: Not on file  Occupational History  . Occupation:  retired    Comment: retired Licensed conveyancer  Tobacco Use  . Smoking status: Former Smoker    Packs/day: 1.50    Years: 30.00    Pack years: 45.00    Quit date: 11/04/1991    Years since quitting: 28.8  . Smokeless tobacco: Never Used  Substance and Sexual Activity  . Alcohol use: Yes    Comment: 1 glass of wine/ night  . Drug use: No  . Sexual activity: Not on file  Other Topics Concern  . Not on file  Social History Narrative   Lives with daughter   Social Determinants of Health   Financial Resource Strain:   . Difficulty of Paying Living Expenses: Not on file  Food Insecurity:   . Worried About Charity fundraiser in the Last Year: Not on file  . Ran Out of Food in the Last Year: Not on file  Transportation Needs:   . Lack of Transportation (Medical): Not on file  . Lack of Transportation (Non-Medical): Not on file  Physical Activity:   . Days of Exercise per Week: Not on file  . Minutes of Exercise per Session: Not on file  Stress:   . Feeling of Stress : Not on file  Social Connections:   . Frequency of Communication with Friends and Family: Not on file  . Frequency of Social Gatherings with Friends and Family: Not on file  . Attends Religious Services: Not on file  . Active Member of Clubs or Organizations: Not on file  . Attends Archivist Meetings: Not on file  . Marital Status: Not on file     Family History:  The patient's family history includes Hypertension in her sister; Stroke in her maternal grandfather; Suicidality in her  father.   ROS General: Negative; No fevers, chills, or night sweats;  HEENT: Negative; No changes in vision or hearing, sinus congestion, difficulty swallowing Pulmonary: Positive for COPD Cardiovascular: History of atrial fibrillation and flutter GI: Negative; No nausea, vomiting, diarrhea, or abdominal pain GU: Negative; No dysuria, hematuria, or difficulty voiding Musculoskeletal: Negative; no myalgias, joint pain, or weakness Hematologic/Oncology: Negative; no easy bruising, bleeding Endocrine: Negative; no heat/cold intolerance; no diabetes Neuro: Restless legs Skin: Negative; No rashes or skin lesions Psychiatric: Negative; No behavioral problems, depression Sleep: Positive for complex sleep apnea and restless legs.  Positive for snoring; no hypnogognic hallucinations, no cataplexy Other comprehensive 14 point system review is negative.   PHYSICAL EXAM:   VS:  BP 138/82   Pulse 61   Ht '5\' 3"'  (1.6 m)   Wt 126 lb 12.8 oz (57.5 kg)   SpO2 94%   BMI 22.46 kg/m     Repeat blood pressure by me   Wt Readings from Last 3 Encounters:  08/20/20 126 lb 12.8 oz (57.5 kg)  08/06/20 126 lb 6.4 oz (57.3 kg)  08/01/20 128 lb (58.1 kg)    General: Alert, oriented, no distress.  Skin: normal turgor, no rashes, warm and dry HEENT: Normocephalic, atraumatic. Pupils equal round and reactive to light; sclera anicteric; extraocular muscles intact; Fundi ** Nose without nasal septal hypertrophy Mouth/Parynx benign; Mallinpatti scale Neck: No JVD, no carotid bruits; normal carotid upstroke Lungs: clear to ausculatation and percussion; no wheezing or rales Chest wall: without tenderness to palpitation Heart: PMI not displaced, RRR, s1 s2 normal, 1/6 systolic murmur, no diastolic murmur, no rubs, gallops, thrills, or heaves Abdomen: soft, nontender; no hepatosplenomehaly, BS+; abdominal aorta nontender and not dilated by palpation. Back: no  CVA tenderness Pulses 2+ Musculoskeletal: full  range of motion, normal strength, no joint deformities Extremities: no clubbing cyanosis or edema, Homan's sign negative  Neurologic: grossly nonfocal; Cranial nerves grossly wnl Psychologic: Normal mood and affect   Studies/Labs Reviewed:   EKG:  EKG is ordered today.  ECG (independently read by me): NSR at 61; LAE, IRBBB, QS V1-2  Recent Labs: BMP Latest Ref Rng & Units 08/01/2020 04/11/2020 03/15/2020  Glucose 65 - 99 mg/dL 111(H) 82 129(H)  BUN 8 - 27 mg/dL 21 33(H) 19  Creatinine 0.57 - 1.00 mg/dL 1.01(H) 1.15(H) 1.11(H)  BUN/Creat Ratio 12 - 28 21 29(H) 17  Sodium 134 - 144 mmol/L 134 136 137  Potassium 3.5 - 5.2 mmol/L 4.1 5.0 4.9  Chloride 96 - 106 mmol/L 94(L) 99 99  CO2 20 - 29 mmol/L '26 24 23  ' Calcium 8.7 - 10.3 mg/dL 9.5 9.1 9.7     Hepatic Function Latest Ref Rng & Units 04/11/2020 08/24/2019  Total Protein 6.0 - 8.5 g/dL 6.2 6.9  Albumin 3.6 - 4.6 g/dL 4.3 4.5  AST 0 - 40 IU/L 24 21  ALT 0 - 32 IU/L 25 18  Alk Phosphatase 48 - 121 IU/L 106 96  Total Bilirubin 0.0 - 1.2 mg/dL 0.5 0.5    CBC Latest Ref Rng & Units 07/31/2020 04/11/2020 03/15/2020  WBC 3.4 - 10.8 x10E3/uL 7.7 6.1 6.6  Hemoglobin 11.1 - 15.9 g/dL 14.0 13.9 15.2  Hematocrit 34.0 - 46.6 % 39.7 40.1 44.6  Platelets 150 - 450 x10E3/uL 331 382 359   Lab Results  Component Value Date   MCV 95 07/31/2020   MCV 96 04/11/2020   MCV 96 03/15/2020   Lab Results  Component Value Date   TSH 1.860 08/24/2019   No results found for: HGBA1C   BNP No results found for: BNP  ProBNP No results found for: PROBNP   Lipid Panel     Component Value Date/Time   CHOL 149 04/11/2020 0838   TRIG 60 04/11/2020 0838   HDL 69 04/11/2020 0838   CHOLHDL 2.2 04/11/2020 0838   LDLCALC 68 04/11/2020 0838   LABVLDL 12 04/11/2020 0838     RADIOLOGY: No results found.   Additional studies/ records that were reviewed today include:  The patient's diagnostic polysomnogram, CPAP/BiPAP titration, and ASV titrations  were reviewed. Recent records of Dr. Brett Fairy were reviewed  A download was obtained in the office today from September 18 through August 19, 2020 on her ResMed air curve 10 ASV unit.  She is 100% compliant with use with average usage at 6 hours and 29 minutes.  Apparently are a minimum EPAP of 12 with maximum EPAP of 12, minimum pressure support of 3 with maximum pressure support of 3.  95th percentile pressure was 15.3/7.5 with a maximum average pressure of 20.3/8.7.  AHI is 3.8.  There were no central events.  Tidal volume 95th percentile was 875.  95th percentile respiratory rate was 14 with a medium of 11.  Minute ventilation 95th percentile average was 8.4 L/min.  ASSESSMENT:    1. Complex sleep apnea syndrome: ASV   2. Essential hypertension   3. PAF (paroxysmal atrial fibrillation) ([place])   4. Other emphysema ([place])   5. Pulmonary hypertension, unspecified ([place])   6. Chronic anticoagulation   7. RLS (restless legs syndrome)     PLAN:  [name] is an 86 year old female who has cardiovascular comorbidities including hypertension, history of atrial fibrillation/flutter, COPD/emphysema as well  as GERD and chronic constipation.  She has been found to have complex sleep apnea with both obstructive and central events which seems to have benefited from adaptive servo ventilation therapy.  On echocardiography she has documented normal LV function with EF at 60 to 65% without wall motion abnormalities.  She had moderate pulmonary hypertension with an estimated peak PA pressure at 51 mm.  She also has restless legs which may have been contributing to some of her difficulty with sleep maintenance and some insomnia.  She had been on long-term therapy with Klonopin which is in the process of being weaned and she was recently started on gabapentin to see if this could improve some of her urge to move and painful restless leg symptoms.  Presently she is taking 100 mg at dinner and 100 mg at  bedtime.  She has noticed some improvement.  She has undergone iron studies and is not felt to be iron deficient.  Ferritin level in excess of at least 50 is recommended. For some reason she was never on my schedule to be seen following her prior sleep study evaluations.  Her most recent download on ASV demonstrates significant benefit with an AHI of 3.8 without central events.  Presently she is euvolemic but her blood pressure is elevated today and on repeat by me was 160/80.  She has been on a medical regimen of amlodipine 5 mg, hydralazine 10 mg twice a day in addition to irbesartan 75 mg and hydrochlorothiazide 25 mg.  I am recommending increasing hydralazine to 20 mg twice a day and this may need to be further increased if necessary.  She states her blood pressure is typically elevated in the morning but improves as the day progresses.  She continues to be on Eliquis for anticoagulation and is unaware of breakthrough atrial fibrillation or flutter but continues to be on amiodarone 200 mg daily.  Because of her COPD emphysema she also is on Stiolto Respimat inhalation every morning.  Review of her download indicates that she is having significant mask leak particularly over the past several weeks.  Oftentimes the mask leak is contributing to some of her waking up in 1-1/2 to 2-1/2 hours after sleeping.  I am suggesting a trial of a ResMed air fit F 30i mask which I believe will be significantly improved from her present full facemask.  Particularly with the tubing coming from the crown of the head this should allow for improved mobility with sideways motion and reduce potential that her mask may shift and create her leak.  I have not recommended any additional medication for sleep maintenance particularly with her age of 48 years.  I will see her in 6 months for follow-up evaluation or sooner as needed.  Time spent: 50 minutes  Medication Adjustments/Labs and Tests Ordered: Current medicines are reviewed at  length with the patient today.  Concerns regarding medicines are outlined above.  Medication changes, Labs and Tests ordered today are listed in the Patient Instructions below. Patient Instructions  Medication Instructions:   Increase -- taking Hydralazine 20 mg  twice a day  *If you need a refill on your cardiac medications before your next appointment, please call your pharmacy*   Lab Work: Not needed .  Testing/Procedures: Not needed   Follow-Up: At [facility], you and your health needs are our priority.  As part of our continuing mission to provide you with exceptional heart care, we have created designated Provider Care Teams.  These Care Teams include  your primary Cardiologist (physician) and Advanced Practice Providers (APPs -  Physician Assistants and Nurse Practitioners) who all work together to provide you with the care you need, when you need it.      Your next appointment:   6 month(s)- sleep clinic  The format for your next appointment:   In Person  Provider:   Shelva Majestic, MD       Signed, Shelva Majestic, MD  08/24/2020 4:42 PM    [facility] [address], [place], [place], [place]  [address] Phone: [phone]

## 2020-08-20 NOTE — Patient Instructions (Addendum)
Medication Instructions:   Increase -- taking Hydralazine 20 mg  twice a day  *If you need a refill on your cardiac medications before your next appointment, please call your pharmacy*   Lab Work: Not needed .  Testing/Procedures: Not needed   Follow-Up: At [facility], you and your health needs are our priority.  As part of our continuing mission to provide you with exceptional heart care, we have created designated Provider Care Teams.  These Care Teams include your primary Cardiologist (physician) and Advanced Practice Providers (APPs -  Physician Assistants and Nurse Practitioners) who all work together to provide you with the care you need, when you need it.      Your next appointment:   6 month(s)- sleep clinic  The format for your next appointment:   In Person  Provider:   Shelva Majestic, MD

## 2020-08-21 ENCOUNTER — Telehealth: Payer: Self-pay | Admitting: Physician Assistant

## 2020-08-21 ENCOUNTER — Other Ambulatory Visit: Payer: Self-pay | Admitting: Emergency Medicine

## 2020-08-21 ENCOUNTER — Telehealth: Payer: Medicare Other | Admitting: Cardiology

## 2020-08-22 MED ORDER — AMITIZA 24 MCG PO CAPS: 24.0000 ug | ORAL_CAPSULE | Freq: Two times a day (BID) | ORAL | 3 refills | Status: DC

## 2020-08-22 NOTE — Telephone Encounter (Signed)
Called and spoke with patient, I have advised her that we have received the office note from [facility] in [facility], but we have not received the actual [place]. I let her know that I was advised that it was not showing up in their system so they were requesting it from the facility that performed it. I also let her know that I have sent in the [place] to [facility].

## 2020-08-24 ENCOUNTER — Encounter: Payer: Self-pay | Admitting: Cardiovascular Disease

## 2020-08-27 ENCOUNTER — Other Ambulatory Visit: Payer: Self-pay

## 2020-08-27 ENCOUNTER — Telehealth (INDEPENDENT_AMBULATORY_CARE_PROVIDER_SITE_OTHER): Payer: Medicare Other | Admitting: Cardiology

## 2020-08-27 ENCOUNTER — Encounter: Payer: Self-pay | Admitting: Cardiology

## 2020-08-27 DIAGNOSIS — R0602 Shortness of breath: Secondary | ICD-10-CM

## 2020-08-27 DIAGNOSIS — E782 Mixed hyperlipidemia: Secondary | ICD-10-CM

## 2020-08-27 DIAGNOSIS — I739 Peripheral vascular disease, unspecified: Secondary | ICD-10-CM

## 2020-08-27 DIAGNOSIS — Z7901 Long term (current) use of anticoagulants: Secondary | ICD-10-CM

## 2020-08-27 DIAGNOSIS — I10 Essential (primary) hypertension: Secondary | ICD-10-CM

## 2020-08-27 DIAGNOSIS — I480 Paroxysmal atrial fibrillation: Secondary | ICD-10-CM

## 2020-08-27 NOTE — Assessment & Plan Note (Signed)
Dr Rocky Crafts follows. S/p bilateral iliac PTA May 2021

## 2020-08-27 NOTE — Assessment & Plan Note (Signed)
This has been chronic but she thinks its a little worse.  ? Secondary to bradycardia. She is to f/u with EP next week.  I'll have her get a TSH drawn before then, CBC and BMP done last month looked OK.

## 2020-08-27 NOTE — Assessment & Plan Note (Signed)
Somewhat labile but averaging 688-648 systolic.  She has had some low readings with symptoms. I suggested she skip her PM Hydralazine if her systolic B/P in the morning is 100 or less.

## 2020-08-27 NOTE — Addendum Note (Signed)
Addended by: Mady Haagensen on: 08/27/2020 01:13 PM   Modules accepted: Orders

## 2020-08-27 NOTE — Assessment & Plan Note (Signed)
CHADS2 VASc= 4, on Eliquis

## 2020-08-27 NOTE — Assessment & Plan Note (Signed)
On statin Rx- LDL 68 -She is complaining of diffuse muscle pain.  I suggested a 3-4 week statin holiday.  Resume Pravachol if no change in symptoms. Is her symptoms improve consider PCSK9.  She has been seen by Dr Debara Pickett -will defer lipid Rx to him.

## 2020-08-27 NOTE — Progress Notes (Signed)
Virtual Visit via Telephone Note   This visit type was conducted due to national recommendations for restrictions regarding the COVID-19 Pandemic (e.g. social distancing) in an effort to limit this patient's exposure and mitigate transmission in our community.  Due to her co-morbid illnesses, this patient is at least at moderate risk for complications without adequate follow up.  This format is felt to be most appropriate for this patient at this time.  The patient did not have access to video technology/had technical difficulties with video requiring transitioning to audio format only (telephone).  All issues noted in this document were discussed and addressed.  No physical exam could be performed with this format.  Please refer to the patient's chart for her  consent to telehealth for [facility].    Date:  08/27/2020   ID:  Holly Benson, DOB [birth year], MRN [id number] The patient was identified using 2 identifiers.  Patient Location: Home Provider Location: Office/Clinic  PCP:  Michael Boston, MD  Cardiologist:  Pixie Casino, MD  Electrophysiologist:  Constance Haw, MD   Evaluation Performed:  Follow-Up Visit  Chief Complaint:  DOE  History of Present Illness:    Holly Benson is a 86 y.o. female with vascular disease, status post bilateral iliac artery intervention by Dr. Fletcher Anon in May 2021, PAF on Eliquis and amiodarone, followed by Dr. Curt Bears, hypertension, dyslipidemia, followed by Dr. Debara Pickett, and sleep apnea and restless leg syndrome followed by Dr. Brett Fairy.  I saw her in the office 08/01/2020.  She has had some issues with her blood pressure.  I adjusted her medications, suggesting she take her hydralazine twice daily instead of 3 times daily since she frequently forgot to take her midday dose.  She has a history of intolerance to metoprolol and she believes amlodipine.  I contacted the patient today by phone.  Her blood pressures been pretty well controlled.   Today's pressure was 129/67.  She also tells me at times her blood pressure may be in the 90s and she feels poorly.  I suggested she skip her p.m. hydralazine dose if her blood pressure that morning is 998 systolic or less.  She tells me usually it runs in the 130/70 range.  She also complains of dyspnea on exertion.  This has been chronic but she feels it somewhat worse.  Her heart rate has been in the 60s.  She has a follow-up with EP next week.  In addition to the above she also complains of generalized muscle soreness, she is on Pravachol for dyslipidemia.  The patient does not have symptoms concerning for COVID-19 infection (fever, chills, cough, or new shortness of breath).    Past Medical History:  Diagnosis Date  . Arrhythmia   . Arthritis   . Atrial fibrillation ([place])   . Constipation    chronic  . COPD (chronic obstructive pulmonary disease) ([place])   . Hyperlipidemia   . Hypertension    pulmonary  . OSA on CPAP   . Osteoporosis   . RLS (restless legs syndrome)   . Sleep apnea    Past Surgical History:  Procedure Laterality Date  . ABDOMINAL AORTOGRAM W/LOWER EXTREMITY N/A 03/21/2020   Procedure: ABDOMINAL AORTOGRAM W/ Bilateral LOWER EXTREMITY Runoff;  Surgeon: Wellington Hampshire, MD;  Location: [place] [facility];  Service: Cardiovascular;  Laterality: N/A;  . ABDOMINAL HYSTERECTOMY    . arm surgery Right    Broken arm and has a plate in it  . CATARACT EXTRACTION Bilateral   .  COLONOSCOPY     More than 10 years ago In [facility]  . KNEE ARTHROSCOPY Right   . PERIPHERAL VASCULAR INTERVENTION Bilateral 03/21/2020   Procedure: PERIPHERAL VASCULAR INTERVENTION;  Surgeon: Wellington Hampshire, MD;  Location: [place] [facility];  Service: Cardiovascular;  Laterality: Bilateral;  external iliac     Current Meds  Medication Sig  . acetaminophen (TYLENOL) 325 MG tablet Take 650 mg by mouth every 6 (six) hours as needed for moderate pain or headache.  . albuterol (VENTOLIN HFA)  108 (90 Base) MCG/ACT inhaler Inhale 2 puffs into the lungs every 6 (six) hours as needed for wheezing or shortness of breath.  Marland Kitchen amiodarone (PACERONE) 200 MG tablet Take 1 tablet (200 mg total) by mouth daily.  . AMITIZA 24 MCG capsule Take 1 capsule (24 mcg total) by mouth 2 (two) times daily with a meal.  . apixaban (ELIQUIS) 5 MG TABS tablet Take 1 tablet (5 mg total) by mouth 2 (two) times daily.  . Carboxymethylcellulose Sodium (THERATEARS OP) Place 1 drop into both eyes daily as needed (dry eyes).  . cetirizine (ZYRTEC) 10 MG tablet Take 10 mg by mouth daily as needed for allergies.   Marland Kitchen clopidogrel (PLAVIX) 75 MG tablet Take 75 mg by mouth daily.  . Coenzyme Q10 (COQ-10) 100 MG CAPS Take 100 mg by mouth in the morning and at bedtime.  . gabapentin (NEURONTIN) 100 MG capsule Take one at dinner time and another one at bedtime .  . hydrALAZINE (APRESOLINE) 10 MG tablet Take 2 tablets (20 mg total) by mouth 2 (two) times daily.  . hydrochlorothiazide (HYDRODIURIL) 25 MG tablet Take 1 tablet (25 mg total) by mouth daily.  . irbesartan (AVAPRO) 75 MG tablet Take 1 tablet (75 mg total) by mouth daily.  . Misc Natural Products (COLON CLEANSE) CAPS Take 4 capsules by mouth daily as needed (constipation).  Marland Kitchen OVER THE COUNTER MEDICATION Take 1 tablet by mouth daily. drenatrophin pmg otc supplement  . polyethylene glycol (MIRALAX / GLYCOLAX) 17 g packet Take 17 g by mouth daily as needed for moderate constipation.  . pravastatin (PRAVACHOL) 40 MG tablet Take 40 mg by mouth daily.   Marland Kitchen STIOLTO RESPIMAT 2.5-2.5 MCG/ACT AERS USE 2 INHALATIONS ORALLY   EVERY MORNING     Allergies:   Lovenox [enoxaparin sodium] and Metoprolol   Social History   Tobacco Use  . Smoking status: Former Smoker    Packs/day: 1.50    Years: 30.00    Pack years: 45.00    Quit date: 11/04/1991    Years since quitting: 28.8  . Smokeless tobacco: Never Used  Substance Use Topics  . Alcohol use: Yes    Comment: 1 glass of  wine/ night  . Drug use: No     Family Hx: The patient's family history includes Hypertension in her sister; Stroke in her maternal grandfather; Suicidality in her father. There is no history of Colon cancer or Esophageal cancer.  ROS:   Please see the history of present illness.    All other systems reviewed and are negative.   Prior CV studies:   The following studies were reviewed today: Echo 11/18/2017- Study Conclusions   - Left ventricle: The cavity size was normal. Wall thickness was  increased in a pattern of mild LVH. Systolic function was normal.  The estimated ejection fraction was in the range of 60% to 65%.  Wall motion was normal; there were no regional wall motion  abnormalities. The study  is not technically sufficient to allow  evaluation of LV diastolic function.  - Mitral valve: Mildly thickened leaflets . There was trivial  regurgitation.  - Left atrium: Moderately dilated.  - Right ventricle: The cavity size was mildly dilated.  - Right atrium: Moderately dilated.  - Tricuspid valve: There was moderate regurgitation.  - Pulmonary arteries: PA peak pressure: 51 mm Hg (S).  - Inferior vena cava: The vessel was dilated. The respirophasic  diameter changes were blunted (< 50%), consistent with elevated  central venous pressure.   Impressions:   - LVEF 60-65%, mild LVH, normal wall motion, trivial MR, moderate  biatrial enlargement, mild RVE, RVSP 51 mmHg, dilated IVC.    Labs/Other Tests and Data Reviewed:    EKG:  The ekg ordered 02/07/2020 demonstrates NSR- HR 64  Recent Labs: 04/11/2020: ALT 25 07/31/2020: Hemoglobin 14.0; Platelets 331 08/01/2020: BUN 21; Creatinine, Ser 1.01; Potassium 4.1; Sodium 134   Recent Lipid Panel Lab Results  Component Value Date/Time   CHOL 149 04/11/2020 08:38 AM   TRIG 60 04/11/2020 08:38 AM   HDL 69 04/11/2020 08:38 AM   CHOLHDL 2.2 04/11/2020 08:38 AM   LDLCALC 68 04/11/2020 08:38 AM    Wt  Readings from Last 3 Encounters:  08/27/20 128 lb 6.4 oz (58.2 kg)  08/20/20 126 lb 12.8 oz (57.5 kg)  08/06/20 126 lb 6.4 oz (57.3 kg)     Risk Assessment/Calculations:     CHA2DS2-VASc Score =   5 This indicates a  % annual risk of stroke. The patient's score is based upon:       Objective:    Vital Signs:  BP 129/67   Pulse 61   Ht 5\' 3"  (1.6 m)   Wt 128 lb 6.4 oz (58.2 kg)   BMI 22.75 kg/m    VITAL SIGNS:  reviewed  ASSESSMENT & PLAN:    DOE Could be multi factorial- COPD, AF with slow VR, Amiodarone Rx.  It does not sound like CHF.  Check TSH, keep f/u with EP as scheduled.  Essential hypertension Labile readings- hold pm Hydralazine if her systolic B/P is 884 systolic or less.  PAF (paroxysmal atrial fibrillation) (HCC) NSR on Amiodarone- seen by Dr Curt Bears April 2021- monitor reviewed, NSR- no change in Rx  Mixed hyperlipidemia On statin Rx- LDL 68. She c/o myalgia- will try statin holiday.  PVD (peripheral vascular disease) (HCC) Dr Rocky Crafts follows. S/p bilateral iliac PTA May 2021  COPD (chronic obstructive pulmonary disease) (HCC) Followed by Dr Lamonte Sakai  Complex sleep apnea syndrome Dr Brett Fairy follows- on C-pap  Chronic anticoagulation CHADS2 VASc= 4, on Eliquis  COVID-19 Education: The signs and symptoms of COVID-19 were discussed with the patient and how to seek care for testing (follow up with PCP or arrange E-visit).  The importance of social distancing was discussed today.  Time:   Today, I have spent 20 minutes with the patient with telehealth technology discussing the above problems.     Medication Adjustments/Labs and Tests Ordered: Current medicines are reviewed at length with the patient today.  Concerns regarding medicines are outlined above.   Tests Ordered: No orders of the defined types were placed in this encounter.   Medication Changes: No orders of the defined types were placed in this encounter.   Follow Up:  In  Person With EP clinic as scheduled.   Angelena Form, PA-C  08/27/2020 12:35 PM    [facility]

## 2020-08-27 NOTE — Patient Instructions (Signed)
Medication Instructions:  Your physician has recommended you make the following change in your medication:   1) Hold Hydralazine evening dose if systolic blood pressure (top number) is 100 or less mid morning. 2) Hold Pravastatin for 3-4 weeks, if pain improves contact Dr Debara Pickett for follow up, if it doesn't improve resume Pravastatin.  *If you need a refill on your cardiac medications before your next appointment, please call your pharmacy*  Lab Work: Your physician recommends that you return for lab work on 09/04/20  Testing/Procedures: None ordered today  Follow-Up: Keep scheduled follow up with Dr. Fletcher Anon

## 2020-08-27 NOTE — Assessment & Plan Note (Signed)
NSR on Amiodarone- seen by Dr Curt Bears April 2021- monitor reviewed, NSR- no change in Rx

## 2020-08-28 ENCOUNTER — Other Ambulatory Visit: Payer: Self-pay | Admitting: *Deleted

## 2020-08-28 ENCOUNTER — Other Ambulatory Visit: Payer: Self-pay

## 2020-08-28 DIAGNOSIS — R0602 Shortness of breath: Secondary | ICD-10-CM

## 2020-08-28 DIAGNOSIS — I480 Paroxysmal atrial fibrillation: Secondary | ICD-10-CM | POA: Diagnosis not present

## 2020-08-28 DIAGNOSIS — R002 Palpitations: Secondary | ICD-10-CM

## 2020-08-28 LAB — TSH: TSH: 2.77 u[IU]/mL (ref 0.450–4.500)

## 2020-08-28 MED ORDER — GABAPENTIN 100 MG PO CAPS: ORAL_CAPSULE | ORAL | 5 refills | Status: DC

## 2020-08-28 NOTE — Progress Notes (Signed)
TSH re-entered, previous lab was placed for [facility].

## 2020-09-02 NOTE — Progress Notes (Signed)
Cardiology Office Note Date:  09/02/2020  Patient ID:  Holly, Benson 13-Feb-1933, MRN [id number] PCP:  Michael Boston, MD  Cardiologist:  Dr. Debara Pickett Electrophysiologist: Dr. Curt Bears    Chief Complaint: 6 mo follow up  History of Present Illness: Holly Benson is a 86 y.o. female with history of HTN, HLD, mixed CM, OSA w/CPAP, COPD, PAfib, flutter, PVD s/p b/l iliac stenting may 2021.  She saw Dr. Curt Bears 10/2019, at that time she c/o some fatigue, palpitations particularly mornings, and her amiodarone increased 200mg  BID for a month then back to daily  She saw J. Cleaver, NP 12/07/19, his note lays out quite well her past few months of AFib, symptoms, and medication progression, mostly adjusting rate control drugs 2/2 bradycardia.  At his visit she was was in [place], noted pattern to her palpitations mainly mornings and sporadically in the evenings.  Discussed triggers, importance of sleep/CPAP use and minimizing ETOH, etc.     A number of subsequent my chart notes, noted after a massage she felt unusually fatigued, weak, noting HR 40's, her dilt was stopped, amiodarone continued, planned to keep her appt here.  I saw her march 2021 She comes in today all in all not feeling great. She feels like she is still having some AFib, aware of the palpitations and her AFib really sucks the energy out of her.  These episodes are typically fairly brief, usually in the mornings, lasting 10 minutes to at most a hour, once she had an episode that lasted several hours (the day after her covid vaccine). She denies any near syncope or syncope.  Has had a couple episodes when standig that she felt like she might need to sit.  These not necessarily new for her. She denies any CP, no bleeding or signs of bleeding with her Eliquis She did stop the diltiazem though resumed it only 3 days later because her BP was high 170's/90's She is not sure that she appreciated any obvious difference the 3 days off it. She  brings a log, writing often bieif dizzy spells,some days more then others, some with reports of HR 40's others 60's Some days she feels quite good as well. She said she woke this AM "as ussual" feeling fatigued, weak in general, but currently feels good.   She was in [place] with some junctional beats, her dilt was stopped and started on losartan.  Discussed possibly tachy/brady perhaps pacing needs in the future, planned to monitor to try and establish if her symptoms were associated with AF, brady and follow up with Dr. Curt Bears. Her monitor was all sinus avg HR 66, nocturnal rates 40's  She saw J. Cleaver, NP shortly afterwards, her ARB had been adjusted, mentioned she was very active participating in yoga, doing housework, errands, etc.  She saw Dr. Curt Bears April 2021, from an AFib perspective doing well off the dilt, was following with cards team for her BP  She was found to have PVD by K. Lawrence, [place], and referred to Dr. Fletcher Anon, underwent b/l external iliac artery stenting 03/21/2020   She had a tele health visit 08/28/20 with L. Rosalyn Gess, PA, mentioned some lower BPs making her feel poorly, some DOE more so then her usual, and generalized muscle soreness. Felt her SOB may be multifactorial (COPD, ?AFib ?slow VR, amiodarone), and recommended to keep EP follow up. Instructed to hold her PM hydralazine if BP is <100 Planned to hold statin and see if myalgias resolve  She last saw Dr.  Byrum (pulmonary) in Sept, COPD felt to be stable, mentioned a cough seemed to be 2/2 GERD, planned f/u with GI, was also going to get started back on Zyrtec, if not improved planned CXR and further eval   TODAY She feels like her AFib is very well controlled, she has not had any palpitations or cardiac awareness. Her BP has been up/down, her primary cardiology team is working on that.  When her BP gets low she feels weak. No CP. She does say that her SOB is worse.  No rest SOB but walks at the park and measures her  fitness on how many benches she can pass.  Some days she has to stop walking from one bench to the next, about 150 feet, most days is 3 benches. She has had DOE for ears, but thinks worse in the last 6 mo.  She mentions discussing it with Dr. Lamonte Sakai, thinks he felt was more cardiac related.  She has trace edema today, mentions that in the mornings there is none and once up around tends to get some swelling, support stockings are uncomfortable and hard to get on.  No dizzy spells, near syncope or syncope, but feels weak when BP is low   AFib Hx Unclear when diagnosed, known diagnoses for her when she moved her from [place] in 2018 AAD hx: Flecainide started Sep 2020 > despite converting to SR she had increased SOB on drug and was stopped. Amiodarone started Oct 2020 >> current   Past Medical History:  Diagnosis Date  . Arrhythmia   . Arthritis   . Atrial fibrillation ([place])   . Constipation    chronic  . COPD (chronic obstructive pulmonary disease) ([place])   . Hyperlipidemia   . Hypertension    pulmonary  . OSA on CPAP   . Osteoporosis   . RLS (restless legs syndrome)   . Sleep apnea     Past Surgical History:  Procedure Laterality Date  . ABDOMINAL AORTOGRAM W/LOWER EXTREMITY N/A 03/21/2020   Procedure: ABDOMINAL AORTOGRAM W/ Bilateral LOWER EXTREMITY Runoff;  Surgeon: Wellington Hampshire, MD;  Location: [place] [facility];  Service: Cardiovascular;  Laterality: N/A;  . ABDOMINAL HYSTERECTOMY    . arm surgery Right    Broken arm and has a plate in it  . CATARACT EXTRACTION Bilateral   . COLONOSCOPY     More than 10 years ago [facility]  . KNEE ARTHROSCOPY Right   . PERIPHERAL VASCULAR INTERVENTION Bilateral 03/21/2020   Procedure: PERIPHERAL VASCULAR INTERVENTION;  Surgeon: Wellington Hampshire, MD;  Location: [place] [facility];  Service: Cardiovascular;  Laterality: Bilateral;  external iliac    Current Outpatient Medications  Medication Sig Dispense Refill  . acetaminophen  (TYLENOL) 325 MG tablet Take 650 mg by mouth every 6 (six) hours as needed for moderate pain or headache.    . albuterol (VENTOLIN HFA) 108 (90 Base) MCG/ACT inhaler Inhale 2 puffs into the lungs every 6 (six) hours as needed for wheezing or shortness of breath. 18 g 6  . amiodarone (PACERONE) 200 MG tablet Take 1 tablet (200 mg total) by mouth daily. 90 tablet 3  . AMITIZA 24 MCG capsule Take 1 capsule (24 mcg total) by mouth 2 (two) times daily with a meal. 180 capsule 3  . apixaban (ELIQUIS) 5 MG TABS tablet Take 1 tablet (5 mg total) by mouth 2 (two) times daily. 180 tablet 2  . Carboxymethylcellulose Sodium (THERATEARS OP) Place 1 drop into both  eyes daily as needed (dry eyes).    . cetirizine (ZYRTEC) 10 MG tablet Take 10 mg by mouth daily as needed for allergies.     Marland Kitchen clopidogrel (PLAVIX) 75 MG tablet Take 75 mg by mouth daily.    . Coenzyme Q10 (COQ-10) 100 MG CAPS Take 100 mg by mouth in the morning and at bedtime.    . gabapentin (NEURONTIN) 100 MG capsule Take one at dinner time and another one at bedtime . 60 capsule 5  . hydrALAZINE (APRESOLINE) 10 MG tablet Take 2 tablets (20 mg total) by mouth 2 (two) times daily. 360 tablet 3  . hydrochlorothiazide (HYDRODIURIL) 25 MG tablet Take 1 tablet (25 mg total) by mouth daily. 90 tablet 3  . irbesartan (AVAPRO) 75 MG tablet Take 1 tablet (75 mg total) by mouth daily. 90 tablet 3  . Misc Natural Products (COLON CLEANSE) CAPS Take 4 capsules by mouth daily as needed (constipation).    Marland Kitchen OVER THE COUNTER MEDICATION Take 1 tablet by mouth daily. drenatrophin pmg otc supplement    . polyethylene glycol (MIRALAX / GLYCOLAX) 17 g packet Take 17 g by mouth daily as needed for moderate constipation.    . pravastatin (PRAVACHOL) 40 MG tablet Take 40 mg by mouth daily.     Marland Kitchen STIOLTO RESPIMAT 2.5-2.5 MCG/ACT AERS USE 2 INHALATIONS ORALLY   EVERY MORNING 12 g 3   No current facility-administered medications for this visit.    Allergies:   Lovenox  [enoxaparin sodium] and Metoprolol   Social History:  The patient  reports that she quit smoking about 28 years ago. She has a 45.00 pack-year smoking history. She has never used smokeless tobacco. She reports current alcohol use. She reports that she does not use drugs.   Family History:  The patient's family history includes Hypertension in her sister; Stroke in her maternal grandfather; Suicidality in her father.  ROS:  Please see the history of present illness.    All other systems are reviewed and otherwise negative.   PHYSICAL EXAM:  VS:  There were no vitals taken for this visit. BMI: There is no height or weight on file to calculate BMI. Well nourished, well developed, in no acute distress  HEENT: normocephalic, atraumatic  Neck: no JVD, carotid bruits or masses Cardiac:  RRR; no significant murmurs, no rubs, or gallops Lungs:  CTA b/l, no wheezing, rhonchi or rales  Abd: soft, nontender MS: arthritic deformities of her hands, age appropriate atrophy Ext: trace edema  Skin: warm and dry, no rash Neuro:  No gross deficits appreciated Psych: euthymic mood, full affect    EKG not done today  April 2021, [facility] AT Max 96 bpm 09:03am, 03/18 Min 48 bpm 04:29am, 03/20 Avg 66 bpm Less than 1% PACs and PVCs No atrial fibrillation noted Predominant rhythm was sinus rhythm Symptoms associated with sinus rhythm   12/08/2018: stress test  The left ventricular ejection fraction is hyperdynamic (>65%).  Nuclear stress EF: 71%.  There was downsloping of the ST segments in the inferolateral leads during infusion with no significan J point depression which rapidly resolved in early recovery.  The study is normal.  This is a low risk study.   Echocardiogram 11/18/2017 Study Conclusions  - Left ventricle: The cavity size was normal. Wall thickness was  increased in a pattern of mild LVH. Systolic function was normal.  The estimated ejection fraction was in the range of 60% to  65%.  Wall motion was normal; there were  no regional wall motion  abnormalities. The study is not technically sufficient to allow  evaluation of LV diastolic function.  - Mitral valve: Mildly thickened leaflets . There was trivial  regurgitation.  - Left atrium: Moderately dilated.  - Right ventricle: The cavity size was mildly dilated.  - Right atrium: Moderately dilated.  - Tricuspid valve: There was moderate regurgitation.  - Pulmonary arteries: PA peak pressure: 51 mm Hg (S).  - Inferior vena cava: The vessel was dilated. The respirophasic  diameter changes were blunted (< 50%), consistent with elevated  central venous pressure.  Cardiac event monitor 03/10/2018 Sinus Bradycardia / Sinus Rhythm / Sinus Arrhythmia/ Sinus Tachycardia, with intermittent First Degree AV Block, ? Junctional Rhythm and 2 pauses over 2 seconds - this appears to be 2nd degree type II AV block. PVC's, and PAC's noted. Ventricular Couplets, Trigeminy. Atrial Pairs, Runs, Trigeminy. Diary entries correlate with PVC's, and PAC's. No atrial flutter was noted.  Recent Labs: 04/11/2020: ALT 25 07/31/2020: Hemoglobin 14.0; Platelets 331 08/01/2020: BUN 21; Creatinine, Ser 1.01; Potassium 4.1; Sodium 134 08/28/2020: TSH 2.770  04/11/2020: Chol/HDL Ratio 2.2; Cholesterol, Total 149; HDL 69; LDL Chol Calc (NIH) 68; Triglycerides 60   CrCl cannot be calculated (Patient's most recent lab result is older than the maximum 21 days allowed.).   Wt Readings from Last 3 Encounters:  08/27/20 128 lb 6.4 oz (58.2 kg)  08/20/20 126 lb 12.8 oz (57.5 kg)  08/06/20 126 lb 6.4 oz (57.3 kg)     Other studies reviewed: Additional studies/records reviewed today include: summarized above  ASSESSMENT AND PLAN:  1. Paroxysmal AFib/flutter is also mentioned     CHA2DS2Vasc is 4, on Eliquis, appropriately dosed for weight/creat      On amiodarone, no symptoms to suggest recurrence      2. HTN     Looks Ok today      Deferred to her primary cardiology team, recent change/adjustment noted   3. PVD     C/w cardiology/ team, Dr. Fletcher Anon     No claudication  4. SOB     Some at baseline, though she thinks worsening on the last 101mo     No CP, low risk stress test last year  Update her echo, exam notes trace edema that sounds dependent, on HCTZ She does not perceive any palpitations or think she has had any AF At her last visit with Dr. Lamonte Sakai, sounds like her COPD was stable.  I will 1/2 her amiodarone to 100mg  daily and reach out to him to see if he thinks PFTs or  imaging is necessary.  Her lungs today are very clear   Disposition: f/u with Dr. Curt Bears in 79mo, sooner if needed  Current medicines are reviewed at length with the patient today.  The patient did not have any concerns regarding medicines.  Venetia Night, PA-C 09/02/2020 7:50 PM     [facility] [id number] [phone] (office)  [phone] (fax)

## 2020-09-04 ENCOUNTER — Ambulatory Visit (INDEPENDENT_AMBULATORY_CARE_PROVIDER_SITE_OTHER): Payer: Medicare Other | Admitting: Physician Assistant

## 2020-09-04 ENCOUNTER — Encounter: Payer: Self-pay | Admitting: Physician Assistant

## 2020-09-04 ENCOUNTER — Other Ambulatory Visit: Payer: Self-pay

## 2020-09-04 VITALS — BP 118/62 | HR 70 | Ht 63.0 in | Wt 129.0 lb

## 2020-09-04 DIAGNOSIS — I10 Essential (primary) hypertension: Secondary | ICD-10-CM | POA: Diagnosis not present

## 2020-09-04 DIAGNOSIS — R0602 Shortness of breath: Secondary | ICD-10-CM

## 2020-09-04 DIAGNOSIS — I480 Paroxysmal atrial fibrillation: Secondary | ICD-10-CM | POA: Diagnosis not present

## 2020-09-04 DIAGNOSIS — I739 Peripheral vascular disease, unspecified: Secondary | ICD-10-CM | POA: Diagnosis not present

## 2020-09-04 MED ORDER — AMIODARONE HCL 100 MG PO TABS: 100.0000 mg | ORAL_TABLET | Freq: Every day | ORAL | 3 refills | Status: DC

## 2020-09-04 NOTE — Patient Instructions (Signed)
Medication Instructions:   START TAKING AMIODARONE 100  MG ONCE A DAY   *If you need a refill on your cardiac medications before your next appointment, please call your pharmacy*   Lab Work: NONE ORDERED  TODAY   If you have labs (blood work) drawn today and your tests are completely normal, you will receive your results only by:  [facility] (if you have MyChart) OR  A paper copy in the mail If you have any lab test that is abnormal or we need to change your treatment, we will call you to review the results.   Testing/Procedures: Your physician has requested that you have an echocardiogram. Echocardiography is a painless test that uses sound waves to create images of your heart. It provides your doctor with information about the size and shape of your heart and how well your hearts chambers and valves are working. This procedure takes approximately one hour. There are no restrictions for this procedure.    Follow-Up: At [facility], you and your health needs are our priority.  As part of our continuing mission to provide you with exceptional heart care, we have created designated Provider Care Teams.  These Care Teams include your primary Cardiologist (physician) and Advanced Practice Providers (APPs -  Physician Assistants and Nurse Practitioners) who all work together to provide you with the care you need, when you need it.  We recommend signing up for the patient portal called "MyChart".  Sign up information is provided on this After Visit Summary.  MyChart is used to connect with patients for Virtual Visits (Telemedicine).  Patients are able to view lab/test results, encounter notes, upcoming appointments, etc.  Non-urgent messages can be sent to your provider as well.   To learn more about what you can do with MyChart, go to [email or web address].    Your next appointment:   2 month(s)  The format for your next appointment:   In Person  Provider:   Allegra Lai, MD     Other Instructions

## 2020-09-07 DIAGNOSIS — M810 Age-related osteoporosis without current pathological fracture: Secondary | ICD-10-CM | POA: Diagnosis not present

## 2020-09-07 DIAGNOSIS — E785 Hyperlipidemia, unspecified: Secondary | ICD-10-CM | POA: Diagnosis not present

## 2020-09-13 DIAGNOSIS — I739 Peripheral vascular disease, unspecified: Secondary | ICD-10-CM | POA: Diagnosis not present

## 2020-09-13 DIAGNOSIS — I480 Paroxysmal atrial fibrillation: Secondary | ICD-10-CM | POA: Diagnosis not present

## 2020-09-13 DIAGNOSIS — M810 Age-related osteoporosis without current pathological fracture: Secondary | ICD-10-CM | POA: Diagnosis not present

## 2020-09-13 DIAGNOSIS — Z0000 Encounter for general adult medical examination without abnormal findings: Secondary | ICD-10-CM | POA: Diagnosis not present

## 2020-09-13 DIAGNOSIS — G720 Drug-induced myopathy: Secondary | ICD-10-CM | POA: Diagnosis not present

## 2020-09-13 DIAGNOSIS — I1310 Hypertensive heart and chronic kidney disease without heart failure, with stage 1 through stage 4 chronic kidney disease, or unspecified chronic kidney disease: Secondary | ICD-10-CM | POA: Diagnosis not present

## 2020-09-13 DIAGNOSIS — T466X5D Adverse effect of antihyperlipidemic and antiarteriosclerotic drugs, subsequent encounter: Secondary | ICD-10-CM | POA: Diagnosis not present

## 2020-09-13 DIAGNOSIS — J449 Chronic obstructive pulmonary disease, unspecified: Secondary | ICD-10-CM | POA: Diagnosis not present

## 2020-09-13 DIAGNOSIS — E785 Hyperlipidemia, unspecified: Secondary | ICD-10-CM | POA: Diagnosis not present

## 2020-09-13 DIAGNOSIS — Z87891 Personal history of nicotine dependence: Secondary | ICD-10-CM | POA: Diagnosis not present

## 2020-09-17 ENCOUNTER — Other Ambulatory Visit: Payer: Self-pay | Admitting: Internal Medicine

## 2020-09-17 NOTE — Telephone Encounter (Signed)
Rx has been sent to the pharmacy electronically. ° °

## 2020-09-26 ENCOUNTER — Ambulatory Visit (HOSPITAL_COMMUNITY): Payer: Medicare Other | Attending: Cardiology

## 2020-09-26 ENCOUNTER — Other Ambulatory Visit: Payer: Self-pay

## 2020-09-26 DIAGNOSIS — R0602 Shortness of breath: Secondary | ICD-10-CM | POA: Insufficient documentation

## 2020-09-26 LAB — ECHOCARDIOGRAM COMPLETE
Area-P 1/2: 2.74 cm2
P 1/2 time: 663 msec
S' Lateral: 3.1 cm

## 2020-10-04 ENCOUNTER — Other Ambulatory Visit: Payer: Self-pay | Admitting: Internal Medicine

## 2020-10-04 DIAGNOSIS — M810 Age-related osteoporosis without current pathological fracture: Secondary | ICD-10-CM

## 2020-10-09 ENCOUNTER — Encounter: Payer: Self-pay | Admitting: Cardiovascular Disease

## 2020-10-09 ENCOUNTER — Other Ambulatory Visit: Payer: Self-pay

## 2020-10-09 ENCOUNTER — Ambulatory Visit (INDEPENDENT_AMBULATORY_CARE_PROVIDER_SITE_OTHER): Payer: Medicare Other | Admitting: Cardiovascular Disease

## 2020-10-09 VITALS — BP 102/50 | HR 69 | Ht 63.0 in | Wt 130.0 lb

## 2020-10-09 DIAGNOSIS — I739 Peripheral vascular disease, unspecified: Secondary | ICD-10-CM

## 2020-10-09 DIAGNOSIS — E785 Hyperlipidemia, unspecified: Secondary | ICD-10-CM | POA: Diagnosis not present

## 2020-10-09 DIAGNOSIS — R0989 Other specified symptoms and signs involving the circulatory and respiratory systems: Secondary | ICD-10-CM | POA: Diagnosis not present

## 2020-10-09 DIAGNOSIS — I480 Paroxysmal atrial fibrillation: Secondary | ICD-10-CM

## 2020-10-09 DIAGNOSIS — I10 Essential (primary) hypertension: Secondary | ICD-10-CM | POA: Diagnosis not present

## 2020-10-09 NOTE — Patient Instructions (Signed)

## 2020-10-09 NOTE — Progress Notes (Signed)
Cardiology Office Note   Date:  10/09/2020   ID:  Holly Benson, DOB 02/01/1933, MRN [id number]  PCP:  Michael Boston, MD  Cardiologist:  Dr. Debara Pickett  No chief complaint on file.     History of Present Illness: Holly Benson is a 86 y.o. female who is here today for follow-up visit regarding peripheral arterial disease.  She has known history of atrial fibrillation on long-term anticoagulation with Eliquis, essential hypertension, hyperlipidemia, anxiety and obstructive sleep apnea on CPAP.She is a previous smoker and has no history of diabetes.  She was seen in May for severe bilateral back and leg claudication.  Noninvasive vascular studies showed an ABI of 0.88 on the right and 0.69 on the left.  Duplex showed monophasic waveforms starting in the common femoral artery with no evidence of infrainguinal disease.  Aortoiliac duplex showed severe bilateral external iliac artery stenosis and significant left common iliac artery stenosis. I proceeded with angiography in May which showed severe bilateral external iliac artery disease worse on the left side.  I performed successful drug-eluting self-expanding stent placement to bilateral external iliac arteries.  Postprocedure vascular studies showed improvement in ABI to the 0.9 range bilaterally.  Duplex showed patent external iliac artery stents was mildly elevated velocities.  There was borderline moderate disease in bilateral common iliac arteries.  Overall, she reports that she did not have significant improvement in symptoms since revascularization.  She continues to be bothered by low back pain radiating to her legs.  Past Medical History:  Diagnosis Date  . Arrhythmia   . Arthritis   . Atrial fibrillation ([place])   . Constipation    chronic  . COPD (chronic obstructive pulmonary disease) ([place])   . Hyperlipidemia   . Hypertension    pulmonary  . OSA on CPAP   . Osteoporosis   . RLS (restless legs syndrome)   . Sleep apnea      Past Surgical History:  Procedure Laterality Date  . ABDOMINAL AORTOGRAM W/LOWER EXTREMITY N/A 03/21/2020   Procedure: ABDOMINAL AORTOGRAM W/ Bilateral LOWER EXTREMITY Runoff;  Surgeon: Wellington Hampshire, MD;  Location: [facility];  Service: Cardiovascular;  Laterality: N/A;  . ABDOMINAL HYSTERECTOMY    . arm surgery Right    Broken arm and has a plate in it  . CATARACT EXTRACTION Bilateral   . COLONOSCOPY     More than 10 years ago In [facility]  . KNEE ARTHROSCOPY Right   . PERIPHERAL VASCULAR INTERVENTION Bilateral 03/21/2020   Procedure: PERIPHERAL VASCULAR INTERVENTION;  Surgeon: Wellington Hampshire, MD;  Location: [facility];  Service: Cardiovascular;  Laterality: Bilateral;  external iliac     Current Outpatient Medications  Medication Sig Dispense Refill  . acetaminophen (TYLENOL) 325 MG tablet Take 650 mg by mouth every 6 (six) hours as needed for moderate pain or headache.    . albuterol (VENTOLIN HFA) 108 (90 Base) MCG/ACT inhaler Inhale 2 puffs into the lungs every 6 (six) hours as needed for wheezing or shortness of breath. 18 g 6  . amiodarone (PACERONE) 100 MG tablet Take 1 tablet (100 mg total) by mouth daily. 90 tablet 3  . AMITIZA 24 MCG capsule Take 1 capsule (24 mcg total) by mouth 2 (two) times daily with a meal. 180 capsule 3  . Carboxymethylcellulose Sodium (THERATEARS OP) Place 1 drop into both eyes daily as needed (dry eyes).    . cetirizine (ZYRTEC) 10 MG tablet Take 10 mg by mouth  daily as needed for allergies.     . Coenzyme Q10 (COQ-10) 100 MG CAPS Take 100 mg by mouth in the morning and at bedtime.    Marland Kitchen ELIQUIS 5 MG TABS tablet TAKE 1 TABLET TWICE A DAY. 180 tablet 2  . gabapentin (NEURONTIN) 100 MG capsule Take one at dinner time and another one at bedtime . 60 capsule 5  . hydrALAZINE (APRESOLINE) 10 MG tablet Take 10-20 mg by mouth 2 (two) times daily.    . hydrochlorothiazide (HYDRODIURIL) 25 MG tablet Take 1 tablet (25 mg total) by  mouth daily. 90 tablet 3  . irbesartan (AVAPRO) 75 MG tablet Take 1 tablet (75 mg total) by mouth daily. 90 tablet 3  . Misc Natural Products (COLON CLEANSE) CAPS Take 4 capsules by mouth daily as needed (constipation).    Marland Kitchen OVER THE COUNTER MEDICATION Take 1 tablet by mouth daily. drenatrophin pmg otc supplement    . polyethylene glycol (MIRALAX / GLYCOLAX) 17 g packet Take 17 g by mouth daily as needed for moderate constipation.    Marland Kitchen STIOLTO RESPIMAT 2.5-2.5 MCG/ACT AERS USE 2 INHALATIONS ORALLY   EVERY MORNING 12 g 3   No current facility-administered medications for this visit.    Allergies:   Lovenox [enoxaparin sodium] and Metoprolol    Social History:  The patient  reports that she quit smoking about 28 years ago. She has a 45.00 pack-year smoking history. She has never used smokeless tobacco. She reports current alcohol use. She reports that she does not use drugs.   Family History:  The patient's family history includes Hypertension in her sister; Stroke in her maternal grandfather; Suicidality in her father.    ROS:  Please see the history of present illness.   Otherwise, review of systems are positive for none.   All other systems are reviewed and negative.    PHYSICAL EXAM: VS:  BP (!) 102/50   Pulse 69   Ht 5\' 3"  (1.6 m)   Wt 130 lb (59 kg)   BMI 23.03 kg/m  , BMI Body mass index is 23.03 kg/m. GEN: Well nourished, well developed, in no acute distress  HEENT: normal  Neck: no JVD,  or masses.  Right carotid bruit. Cardiac: RRR; no murmurs, rubs, or gallops,no edema  Respiratory:  clear to auscultation bilaterally, normal work of breathing GI: soft, nontender, nondistended, + BS MS: no deformity or atrophy  Skin: warm and dry, no rash Neuro:  Strength and sensation are intact Psych: euthymic mood, full affect Vascular: Femoral pulse: +2 bilaterally.   EKG:  EKG is not ordered today.   Recent Labs: 04/11/2020: ALT 25 07/31/2020: Hemoglobin 14.0; Platelets  331 08/01/2020: BUN 21; Creatinine, Ser 1.01; Potassium 4.1; Sodium 134 08/28/2020: TSH 2.770    Lipid Panel    Component Value Date/Time   CHOL 149 04/11/2020 0838   TRIG 60 04/11/2020 0838   HDL 69 04/11/2020 0838   CHOLHDL 2.2 04/11/2020 0838   LDLCALC 68 04/11/2020 0838      Wt Readings from Last 3 Encounters:  10/09/20 130 lb (59 kg)  09/04/20 129 lb (58.5 kg)  08/27/20 128 lb 6.4 oz (58.2 kg)        PAD Screen 09/10/2017  Previous PAD dx? No  Previous surgical procedure? Yes  Dates of procedures arm 2014  Pain with walking? Yes  Subsides with rest? Yes  Feet/toe relief with dangling? No  Painful, non-healing ulcers? No  Extremities discolored? No  ASSESSMENT AND PLAN:  1.  Peripheral arterial disease: Status post  bilateral external iliac artery stent placement .  She initially reported improvement and back and leg pain but she now reports that she really had only slight change in her symptoms.  Her femoral pulses are almost normal and I wonder if she has another etiology for her symptoms. She is due to have a repeat aortoiliac duplex and ABI.  2.  Atrial fibrillation: Maintaining in sinus rhythm with amiodarone and she is on anticoagulation with Eliquis.  3.  Essential hypertension: Blood pressure is controlled on current medications.  4.  Hyperlipidemia: She used to be on atorvastatin but it appears that it was discontinued due to myalgia.  She might need an alternative medication.  5.  Right carotid bruit: Carotid Doppler showed mild nonobstructive disease bilaterally.    Disposition:   FU with me in 12 months  Signed,  Kathlyn Sacramento, MD  10/09/2020 10:07 AM    [place]

## 2020-10-11 ENCOUNTER — Other Ambulatory Visit: Payer: Self-pay | Admitting: Neurology

## 2020-10-11 ENCOUNTER — Encounter: Payer: Self-pay | Admitting: Neurology

## 2020-10-15 ENCOUNTER — Other Ambulatory Visit: Payer: Self-pay | Admitting: Neurology

## 2020-10-15 MED ORDER — ROPINIROLE HCL 0.25 MG PO TABS: 0.2500 mg | ORAL_TABLET | Freq: Two times a day (BID) | ORAL | 5 refills | Status: DC | PRN

## 2020-10-18 ENCOUNTER — Other Ambulatory Visit: Payer: Self-pay | Admitting: Adult Health

## 2020-10-18 ENCOUNTER — Other Ambulatory Visit: Payer: Self-pay | Admitting: Medical

## 2020-10-18 NOTE — Telephone Encounter (Signed)
This is Dr. Hilty's pt 

## 2020-10-31 ENCOUNTER — Encounter (HOSPITAL_COMMUNITY): Payer: Medicare Other

## 2020-11-12 ENCOUNTER — Ambulatory Visit: Payer: Medicare Other | Admitting: Family Medicine

## 2020-11-22 ENCOUNTER — Ambulatory Visit (HOSPITAL_COMMUNITY): Payer: Medicare Other

## 2020-11-28 NOTE — Progress Notes (Signed)
Electrophysiology Office Note   Date:  11/29/2020   ID:  Holly Benson, DOB 12-Feb-1933, MRN [id number]  PCP:  Michael Boston, MD  Cardiologist:  Debara Pickett Primary Electrophysiologist:  Eragon Hammond Meredith Leeds, MD    Chief Complaint: AF/flutter   History of Present Illness: Holly Benson is a 87 y.o. female who is being seen today for the evaluation of atrial flutter at the request of [name], Jesse Sans, MD. Presenting today for electrophysiology evaluation.  She has a history of hypertension, atrial fibrillation/flutter status post cardioversion, hyperlipidemia, OSA on CPAP, and COPD.    Today, denies symptoms of palpitations, chest pain, shortness of breath, orthopnea, PND, lower extremity edema, claudication, dizziness, presyncope, syncope, bleeding, or neurologic sequela. The patient is tolerating medications without difficulties.  Since last being seen she has done well.  She has noted no further episodes of atrial fibrillation.  She is tolerating the amiodarone without issue.    Past Medical History:  Diagnosis Date  . Arrhythmia   . Arthritis   . Atrial fibrillation ([place])   . Constipation    chronic  . COPD (chronic obstructive pulmonary disease) ([place])   . Hyperlipidemia   . Hypertension    pulmonary  . OSA on CPAP   . Osteoporosis   . RLS (restless legs syndrome)   . Sleep apnea    Past Surgical History:  Procedure Laterality Date  . ABDOMINAL AORTOGRAM W/LOWER EXTREMITY N/A 03/21/2020   Procedure: ABDOMINAL AORTOGRAM W/ Bilateral LOWER EXTREMITY Runoff;  Surgeon: Wellington Hampshire, MD;  Location: [facility];  Service: Cardiovascular;  Laterality: N/A;  . ABDOMINAL HYSTERECTOMY    . arm surgery Right    Broken arm and has a plate in it  . CATARACT EXTRACTION Bilateral   . COLONOSCOPY     More than 10 years ago In [facility]  . KNEE ARTHROSCOPY Right   . PERIPHERAL VASCULAR INTERVENTION Bilateral 03/21/2020   Procedure: PERIPHERAL VASCULAR INTERVENTION;   Surgeon: Wellington Hampshire, MD;  Location: [facility];  Service: Cardiovascular;  Laterality: Bilateral;  external iliac     Current Outpatient Medications  Medication Sig Dispense Refill  . acetaminophen (TYLENOL) 325 MG tablet Take 650 mg by mouth every 6 (six) hours as needed for moderate pain or headache.    . albuterol (VENTOLIN HFA) 108 (90 Base) MCG/ACT inhaler Inhale 2 puffs into the lungs every 6 (six) hours as needed for wheezing or shortness of breath. 18 g 6  . alendronate (FOSAMAX) 70 MG tablet Take 70 mg by mouth once a week.    Marland Kitchen amiodarone (PACERONE) 100 MG tablet Take 1 tablet (100 mg total) by mouth daily. 90 tablet 3  . AMITIZA 24 MCG capsule Take 1 capsule (24 mcg total) by mouth 2 (two) times daily with a meal. 180 capsule 3  . Carboxymethylcellulose Sodium (THERATEARS OP) Place 1 drop into both eyes daily as needed (dry eyes).    . cetirizine (ZYRTEC) 10 MG tablet Take 10 mg by mouth daily as needed for allergies.     . Coenzyme Q10 (COQ-10) 100 MG CAPS Take 100 mg by mouth in the morning and at bedtime.    Marland Kitchen ELIQUIS 5 MG TABS tablet TAKE 1 TABLET TWICE A DAY. 180 tablet 2  . hydrALAZINE (APRESOLINE) 10 MG tablet Take 10-20 mg by mouth 2 (two) times daily.    . hydrochlorothiazide (HYDRODIURIL) 25 MG tablet Take 1 tablet (25 mg total) by mouth daily. 90 tablet 3  .  irbesartan (AVAPRO) 75 MG tablet TAKE ONE TABLET BY MOUTH DAILY 90 tablet 3  . Misc Natural Products (COLON CLEANSE) CAPS Take 4 capsules by mouth daily as needed (constipation).    Marland Kitchen OVER THE COUNTER MEDICATION Take 1 tablet by mouth daily. drenatrophin pmg otc supplement    . polyethylene glycol (MIRALAX / GLYCOLAX) 17 g packet Take 17 g by mouth daily as needed for moderate constipation.    Marland Kitchen rOPINIRole (REQUIP) 0.25 MG tablet Take 1 tablet (0.25 mg total) by mouth 2 (two) times daily as needed (RLS). 60 tablet 5  . STIOLTO RESPIMAT 2.5-2.5 MCG/ACT AERS USE 2 INHALATIONS ORALLY   EVERY MORNING 12 g 3    No current facility-administered medications for this visit.    Allergies:   Lovenox [enoxaparin sodium] and Metoprolol   Social History:  The patient  reports that she quit smoking about 29 years ago. She has a 45.00 pack-year smoking history. She has never used smokeless tobacco. She reports current alcohol use. She reports that she does not use drugs.   Family History:  The patient's family history includes Hypertension in her sister; Stroke in her maternal grandfather; Suicidality in her father.   ROS:  Please see the history of present illness.   Otherwise, review of systems is positive for none.   All other systems are reviewed and negative.   PHYSICAL EXAM: VS:  BP 114/60   Pulse 65   Ht 5\' 3"  (1.6 m)   Wt 125 lb 3.2 oz (56.8 kg)   SpO2 94%   BMI 22.18 kg/m  , BMI Body mass index is 22.18 kg/m. GEN: Well nourished, well developed, in no acute distress  HEENT: normal  Neck: no JVD, carotid bruits, or masses Cardiac: RRR; no murmurs, rubs, or gallops,no edema  Respiratory:  clear to auscultation bilaterally, normal work of breathing GI: soft, nontender, nondistended, + BS MS: no deformity or atrophy  Skin: warm and dry Neuro:  Strength and sensation are intact Psych: euthymic mood, full affect  EKG:  EKG is ordered today. Personal review of the ekg ordered shows sinus rhythm   Recent Labs: 04/11/2020: ALT 25 07/31/2020: Hemoglobin 14.0; Platelets 331 08/01/2020: BUN 21; Creatinine, Ser 1.01; Potassium 4.1; Sodium 134 08/28/2020: TSH 2.770    Lipid Panel     Component Value Date/Time   CHOL 149 04/11/2020 0838   TRIG 60 04/11/2020 0838   HDL 69 04/11/2020 0838   CHOLHDL 2.2 04/11/2020 0838   LDLCALC 68 04/11/2020 0838     Wt Readings from Last 3 Encounters:  11/29/20 125 lb 3.2 oz (56.8 kg)  10/09/20 130 lb (59 kg)  09/04/20 129 lb (58.5 kg)      Other studies Reviewed: Additional studies/ records that were reviewed today include: TTE 11/18/18  Review  of the above records today demonstrates:  - Left ventricle: The cavity size was normal. Wall thickness was   increased in a pattern of mild LVH. Systolic function was normal.   The estimated ejection fraction was in the range of 60% to 65%.   Wall motion was normal; there were no regional wall motion   abnormalities. The study is not technically sufficient to allow   evaluation of LV diastolic function. - Mitral valve: Mildly thickened leaflets . There was trivial   regurgitation. - Left atrium: Moderately dilated. - Right ventricle: The cavity size was mildly dilated. - Right atrium: Moderately dilated. - Tricuspid valve: There was moderate regurgitation. - Pulmonary arteries: PA peak  pressure: 51 mm Hg (S). - Inferior vena cava: The vessel was dilated. The respirophasic   diameter changes were blunted (< 50%), consistent with elevated   central venous pressure.  Cardiac monitor 01/22/2020 personally reviewed Max 96 bpm 09:03am, 03/18 Min 48 bpm 04:29am, 03/20 Avg 66 bpm Less than 1% PACs and PVCs No atrial fibrillation noted Predominant rhythm was sinus rhythm Symptoms associated with sinus rhythm  ASSESSMENT AND PLAN:  1.  Atrial fibrillation/typical atrial flutter: Currently on Eliquis,  metoprolol, amiodarone.  High risk medication monitoring.  CHA2DS2-VASc of 4.  She remains in sinus rhythm.  She feels well and without complaint.  No changes..   2.  Obstructive sleep apnea: CPAP compliance encouraged  3.  Hypertension: Currently well controlled  Current medicines are reviewed at length with the patient today.    The patient does not have concerns regarding her medicines.  The following changes were made today: None  Labs/ tests ordered today include:  Orders Placed This Encounter  Procedures  . EKG 12-Lead     Disposition:   FU with Krissie Merrick 12 months  Signed, Rubens Cranston Meredith Leeds, MD  11/29/2020 11:40 AM     [facility] [address] [place] [address] [phone] (office) [phone] (fax)

## 2020-11-29 ENCOUNTER — Ambulatory Visit (INDEPENDENT_AMBULATORY_CARE_PROVIDER_SITE_OTHER): Payer: Medicare Other | Admitting: Cardiology

## 2020-11-29 ENCOUNTER — Other Ambulatory Visit: Payer: Self-pay

## 2020-11-29 ENCOUNTER — Encounter: Payer: Self-pay | Admitting: Cardiology

## 2020-11-29 VITALS — BP 114/60 | HR 65 | Ht 63.0 in | Wt 125.2 lb

## 2020-11-29 DIAGNOSIS — I4819 Other persistent atrial fibrillation: Secondary | ICD-10-CM | POA: Diagnosis not present

## 2020-11-29 NOTE — Patient Instructions (Signed)
Medication Instructions:  °Your physician recommends that you continue on your current medications as directed. Please refer to the Current Medication list given to you today. ° °*If you need a refill on your cardiac medications before your next appointment, please call your pharmacy* ° ° °Lab Work: °None ordered ° ° °Testing/Procedures: °None ordered ° ° °Follow-Up: °At [facility], you and your health needs are our priority.  As part of our continuing mission to provide you with exceptional heart care, we have created designated Provider Care Teams.  These Care Teams include your primary Cardiologist (physician) and Advanced Practice Providers (APPs -  Physician Assistants and Nurse Practitioners) who all work together to provide you with the care you need, when you need it. ° °Your next appointment:   °1 year(s) ° °The format for your next appointment:   °In Person ° °Provider:   °Will Camnitz, MD ° ° ° °Thank you for choosing [facility]!! ° ° °Emmanuell Kantz, RN °[phone] ° ° ° °

## 2020-12-05 DIAGNOSIS — H6123 Impacted cerumen, bilateral: Secondary | ICD-10-CM | POA: Diagnosis not present

## 2020-12-06 ENCOUNTER — Other Ambulatory Visit: Payer: Self-pay

## 2020-12-06 ENCOUNTER — Other Ambulatory Visit (HOSPITAL_COMMUNITY): Payer: Self-pay | Admitting: Cardiovascular Disease

## 2020-12-06 ENCOUNTER — Ambulatory Visit (HOSPITAL_BASED_OUTPATIENT_CLINIC_OR_DEPARTMENT_OTHER)
Admission: RE | Admit: 2020-12-06 | Discharge: 2020-12-06 | Disposition: A | Discharge status: Home / Self Care | Payer: Medicare Other | Source: Ambulatory Visit | Attending: Cardiovascular Disease | Admitting: Cardiovascular Disease

## 2020-12-06 ENCOUNTER — Ambulatory Visit (HOSPITAL_COMMUNITY)
Admission: RE | Admit: 2020-12-06 | Discharge: 2020-12-06 | Disposition: A | Discharge status: Home / Self Care | Payer: Medicare Other | Source: Ambulatory Visit | Attending: Cardiovascular Disease | Admitting: Cardiovascular Disease

## 2020-12-06 DIAGNOSIS — Z95828 Presence of other vascular implants and grafts: Secondary | ICD-10-CM

## 2020-12-06 DIAGNOSIS — I739 Peripheral vascular disease, unspecified: Secondary | ICD-10-CM

## 2020-12-17 ENCOUNTER — Encounter: Payer: Self-pay | Admitting: *Deleted

## 2020-12-18 ENCOUNTER — Ambulatory Visit (INDEPENDENT_AMBULATORY_CARE_PROVIDER_SITE_OTHER): Payer: Medicare Other | Admitting: Family Medicine

## 2020-12-18 ENCOUNTER — Encounter: Payer: Self-pay | Admitting: Family Medicine

## 2020-12-18 VITALS — BP 116/60 | HR 67 | Ht 63.0 in | Wt 127.0 lb

## 2020-12-18 DIAGNOSIS — G2581 Restless legs syndrome: Secondary | ICD-10-CM | POA: Diagnosis not present

## 2020-12-18 DIAGNOSIS — R202 Paresthesia of skin: Secondary | ICD-10-CM | POA: Diagnosis not present

## 2020-12-18 MED ORDER — GABAPENTIN 100 MG PO CAPS: 100.0000 mg | ORAL_CAPSULE | Freq: Two times a day (BID) | ORAL | 1 refills | Status: DC

## 2020-12-18 NOTE — Progress Notes (Signed)
Chief Complaint  Patient presents with  . Follow-up    RM 1 alone Pt is well, still has restless legs up to thigh, fingertips tingle, go numb and get so cold it hurts      HISTORY OF PRESENT ILLNESS: 12/18/20 ALL:   Holly Benson is a 87 y.o. female here today for follow up for RLS. Clonazepam was weaned and she was started on gabapentin. She reports that after about 2 months, gabapentin was not effective. She was then started on ropinirole 0.25mg  BID. She feels that it may help some but not as good as clonazepam. She was started on clonzepam prn about 25 years ago with PCP. She is doing fairly well, today. She continues to have some difficulty with restless legs and feels it is improved with walking. She has had concerns of tingling of her fingertips, L>R for several months. She feels this was improved on gabapentin. No history of neuropathy. Fingers tingle most of the time then turn red and go numb when hands are cold. Mother had Raynaud's.     HISTORY (copied from Dr Dohmeier's previous note)  Holly Benson a 87 y.o. year old  Caucasian female patientseen here upon a referralby PCP on 07/30/2020 for an evaluation of RLS, in the setting of treated OSA ( the patient is established with Dr Claiborne Billings as her sleep doctor, but has not been asked to follow up, can't remember meeting him in person).   Chiefconcernaccording to patient :  Holly Benson reports problems with insomnia created by restless legs, she has a feeling of having to rub or mesh her legs and she can hardly sit still.  She has undergone a femoral artery stent earlier this year so there was definitely a peripheral vascular disease, she had a humerus fracture on the right side in 2014 she underwent cataract surgeries and hysterectomy in the 1990s.  She has a longstanding history of hypertension, coronary artery and heart disease and atrial fibrillation she dates the onset of her cardiac problems to 2016.  She has been  followed by Lyman Bishop, MD who referred her to the [place] lab after her atrial fibrillation diagnosis which is paroxysmal atrial fibrillation.  At the time she was already on Eliquis, chronically anticoagulated , has failed ca cannel blockers and toprol in rate  control-  taken  Amiodarone since . Dr Curt Bears recommneded medication changes -  Pravachol, Cozaar Microzide and Proventil inhaler.  She is taking Klonopin half milligram tablets. She had an unsuccesfull  Cardioversion-  The first sleep study at [facility] showed a sleep onset after 21 minutes with a sleep efficiency of 73.5% study was performed on 12-27-2018.  She had leg related arousals at index of 5.8/h so periodic limb movements definitely contributed to poor sleep.  She was diagnosed with moderate obstructive sleep apnea an AHI of 16.8 which is still actually mild, severe when she resumed supine sleep position.  There were also some central apnea noted and I would continue to see this as a complex sleep apnea rather than obstructive sleep apnea.  She returned for a CPAP titration which then had to switch to BiPAP modality on 2-28 2020.  Sleep efficiency improved.  She did not have periodic limb movements and related arousals as she was under CPAP and BiPAP.   Her EKG showed PVCs but no atrial fibrillation during the night.  CPAP had been initiated at 5 cm and was titrated up to 11 cmH2O when her AHI  was 20.8/h.  BiPAP was then started at 12/8 and titrated to 14/10 but central apneas were still present and the AHI was still 52/h.  She was given a full facemask during the titration.  Since she failed to respond to CPAP and BiPAP she returned for an ASV titration addressing the residual central apneas.  And an ASV pressure of 5 over 4/15 cmH2O her AHI was reduced to 1.1.  Central apneas were eliminated, interestingly snoring is now reported again.  There were no clinically significant periodic limb movements noted.  The setting for the ASV  machine is an EPAP of minimum 5 a pressure support of minimum 4 maximum 15 and a breeze rate was set on auto there is no set rate for breathing backup.  She started treatment in October 2020 so not even a year ago. She also has a diagnosis now of pulmonary hypertension which was not listed previously.  She reports now that she still has restless legs keeping her from sleeping she has struggled with atrial fibrillation and hypertension over the past year again.  Multiple medication adjustments have not yet controlled her blood pressure.  She does not have chest pain headaches or dizziness.  She has been fully vaccinated for Covid with 2 shots.   Familymedical /sleep history:no  other family member on CPAP with OSA, insomnia, sleep walkers.  Social history:Patient is a retired Licensed conveyancer,  and lives in a household with her daughter and the family-   Family status is widowed , with adult children, 8 grandchildren.  Tobacco use: quit 1993 .  ETOH use, Caffeine intake in form of Coffee( 1 cup in AM ) Soda( /) Tea ( /) or energy drinks. Regular exercise in form of walking.    Hobbies : reading .     Sleep habits are as follows:The patient's dinner time is between 5-7 PM. The patient goes to bed at 9-10 PM and continues to sleep for 2-3  hours, wakes for a long time after that- unclear about the trigger, the first time at 12-1 AM.   The preferred sleep position is laterally, with the support of 1-2 pillows. Dreams are reportedly rare. 6  AM is the usual rise time. The patient wakes up spontaneously.  She reports not feeling refreshed or restored in AM, with symptoms such as dry mouth , morning headaches rarely , and sinus pressure- and residual fatigue.  Naps are taken seldomly.     REVIEW OF SYSTEMS: Out of a complete 14 system review of symptoms, the patient complains only of the following symptoms, tingling of fingertips, restless legs and all other reviewed systems are  negative.    ALLERGIES: Allergies  Allergen Reactions  . Lovenox [Enoxaparin Sodium] Hives and Rash    PT STATES SHE BROKE OUT IN A RASH HEAD TO TOE AND LASTED ABOUT 3 WEEKS   . Metoprolol     Muscle aches     HOME MEDICATIONS: Outpatient Medications Prior to Visit  Medication Sig Dispense Refill  . acetaminophen (TYLENOL) 325 MG tablet Take 650 mg by mouth every 6 (six) hours as needed for moderate pain or headache.    . albuterol (VENTOLIN HFA) 108 (90 Base) MCG/ACT inhaler Inhale 2 puffs into the lungs every 6 (six) hours as needed for wheezing or shortness of breath. 18 g 6  . alendronate (FOSAMAX) 70 MG tablet Take 70 mg by mouth once a week.    Marland Kitchen amiodarone (PACERONE) 100 MG tablet Take 1 tablet (100 mg  total) by mouth daily. 90 tablet 3  . AMITIZA 24 MCG capsule Take 1 capsule (24 mcg total) by mouth 2 (two) times daily with a meal. 180 capsule 3  . Carboxymethylcellulose Sodium (THERATEARS OP) Place 1 drop into both eyes daily as needed (dry eyes).    . cetirizine (ZYRTEC) 10 MG tablet Take 10 mg by mouth daily as needed for allergies.     . Coenzyme Q10 (COQ-10) 100 MG CAPS Take 100 mg by mouth in the morning and at bedtime.    Marland Kitchen ELIQUIS 5 MG TABS tablet TAKE 1 TABLET TWICE A DAY. 180 tablet 2  . hydrALAZINE (APRESOLINE) 10 MG tablet Take 10-20 mg by mouth 2 (two) times daily.    . hydrochlorothiazide (HYDRODIURIL) 25 MG tablet Take 1 tablet (25 mg total) by mouth daily. 90 tablet 3  . irbesartan (AVAPRO) 75 MG tablet TAKE ONE TABLET BY MOUTH DAILY 90 tablet 3  . Misc Natural Products (COLON CLEANSE) CAPS Take 4 capsules by mouth daily as needed (constipation).    Marland Kitchen OVER THE COUNTER MEDICATION Take 1 tablet by mouth daily. drenatrophin pmg otc supplement    . polyethylene glycol (MIRALAX / GLYCOLAX) 17 g packet Take 17 g by mouth daily as needed for moderate constipation.    Marland Kitchen rOPINIRole (REQUIP) 0.25 MG tablet Take 1 tablet (0.25 mg total) by mouth 2 (two) times daily as  needed (RLS). 60 tablet 5  . STIOLTO RESPIMAT 2.5-2.5 MCG/ACT AERS USE 2 INHALATIONS ORALLY   EVERY MORNING 12 g 3   No facility-administered medications prior to visit.     PAST MEDICAL HISTORY: Past Medical History:  Diagnosis Date  . Arrhythmia   . Arthritis   . Atrial fibrillation ([place])   . Constipation    chronic  . COPD (chronic obstructive pulmonary disease) ([place])   . Hyperlipidemia   . Hypertension    pulmonary  . OSA on CPAP   . Osteoporosis   . RLS (restless legs syndrome)   . Sleep apnea      PAST SURGICAL HISTORY: Past Surgical History:  Procedure Laterality Date  . ABDOMINAL AORTOGRAM W/LOWER EXTREMITY N/A 03/21/2020   Procedure: ABDOMINAL AORTOGRAM W/ Bilateral LOWER EXTREMITY [place];  Surgeon: Wellington Hampshire, MD;  Location: [facility];  Service: Cardiovascular;  Laterality: N/A;  . ABDOMINAL HYSTERECTOMY    . arm surgery Right    Broken arm and has a plate in it  . CATARACT EXTRACTION Bilateral   . COLONOSCOPY     More than 10 years ago [facility]  . KNEE ARTHROSCOPY Right   . PERIPHERAL VASCULAR INTERVENTION Bilateral 03/21/2020   Procedure: PERIPHERAL VASCULAR INTERVENTION;  Surgeon: Wellington Hampshire, MD;  Location: [facility];  Service: Cardiovascular;  Laterality: Bilateral;  external iliac     FAMILY HISTORY: Family History  Problem Relation Age of Onset  . Suicidality Father   . Stroke Maternal Grandfather   . Hypertension Sister   . Colon cancer Neg Hx   . Esophageal cancer Neg Hx      SOCIAL HISTORY: Social History   Socioeconomic History  . Marital status: Widowed    Spouse name: Not on file  . Number of children: 3  . Years of education: Not on file  . Highest education level: Not on file  Occupational History  . Occupation: retired    Comment: retired Licensed conveyancer  Tobacco Use  . Smoking status: Former Smoker    Packs/day: 1.50  Years: 30.00    Pack years: 73.00    Quit date: 11/04/1991    Years  since quitting: 29.1  . Smokeless tobacco: Never Used  Substance and Sexual Activity  . Alcohol use: Yes    Comment: 1 glass of wine/ night  . Drug use: No  . Sexual activity: Not on file  Other Topics Concern  . Not on file  Social History Narrative   Lives with daughter   Social Determinants of Health   Financial Resource Strain: Not on file  Food Insecurity: Not on file  Transportation Needs: Not on file  Physical Activity: Not on file  Stress: Not on file  Social Connections: Not on file  Intimate Partner Violence: Not on file      PHYSICAL EXAM  Vitals:   12/18/20 1453  BP: 116/60  Pulse: 67  Weight: 127 lb (57.6 kg)  Height: 5\' 3"  (1.6 m)   Body mass index is 22.5 kg/m.   Generalized: Well developed, in no acute distress  Cardiology: normal rate and rhythm, no murmur auscultated  Respiratory: clear to auscultation bilaterally    Neurological examination  Mentation: Alert oriented to time, place, history taking. Follows all commands speech and language fluent Cranial nerve II-XII: Pupils were equal round reactive to light. Extraocular movements were full, visual field were full on confrontational test. Facial sensation and strength were normal.  Head turning and shoulder shrug  were normal and symmetric. Motor: The motor testing reveals 5 over 5 strength of all 4 extremities. Good symmetric motor tone is noted throughout.  Sensory: Sensory testing is intact to soft touch on all 4 extremities. No evidence of extinction is noted.  Gait and station: Gait is normal. T Reflexes: Deep tendon reflexes are symmetric and normal bilaterally.     DIAGNOSTIC DATA (LABS, IMAGING, TESTING) - I reviewed patient records, labs, notes, testing and imaging myself where available.  Lab Results  Component Value Date   WBC 7.7 07/31/2020   HGB 14.0 07/31/2020   HCT 39.7 07/31/2020   MCV 95 07/31/2020   PLT 331 07/31/2020      Component Value Date/Time   NA 134  08/01/2020 1549   K 4.1 08/01/2020 1549   CL 94 (L) 08/01/2020 1549   CO2 26 08/01/2020 1549   GLUCOSE 111 (H) 08/01/2020 1549   GLUCOSE 129 (H) 10/10/2017 1529   BUN 21 08/01/2020 1549   CREATININE 1.01 (H) 08/01/2020 1549   CALCIUM 9.5 08/01/2020 1549   PROT 6.2 04/11/2020 0838   ALBUMIN 4.3 04/11/2020 0838   AST 24 04/11/2020 0838   ALT 25 04/11/2020 0838   ALKPHOS 106 04/11/2020 0838   BILITOT 0.5 04/11/2020 0838   GFRNONAA 51 (L) 08/01/2020 1549   GFRAA 59 (L) 08/01/2020 1549   Lab Results  Component Value Date   CHOL 149 04/11/2020   HDL 69 04/11/2020   LDLCALC 68 04/11/2020   TRIG 60 04/11/2020   CHOLHDL 2.2 04/11/2020   No results found for: HGBA1C No results found for: VITAMINB12 Lab Results  Component Value Date   TSH 2.770 08/28/2020    No flowsheet data found.   No flowsheet data found.   ASSESSMENT AND PLAN  87 y.o. year old female  has a past medical history of Arrhythmia, Arthritis, Atrial fibrillation ([place]), Constipation, COPD (chronic obstructive pulmonary disease) ([place]), Hyperlipidemia, Hypertension, OSA on CPAP, Osteoporosis, RLS (restless legs syndrome), and Sleep apnea. here with   RLS (restless legs syndrome)  Paresthesia of both  hands  Holly Benson is doing fairly well, today. She is tolerating ropinirole 0.25mg  BID with no obvious adverse effects. She feels that tingling in fingers was better on gabapentin. We will resume low dose at bedtime. She is aware to monitor for sedating side effects. We have discussed concerns of Raynaud's. May consider neuropathy workup in future. History of anemia. Healthy lifestyle habits encouraged. She will follow up in 6 months.   No orders of the defined types were placed in this encounter.    Meds ordered this encounter  Medications  . gabapentin (NEURONTIN) 100 MG capsule    Sig: Take 1 capsule (100 mg total) by mouth 2 (two) times daily.    Dispense:  60 capsule    Refill:  1    Order Specific Question:    Supervising Provider    Answer:   Melvenia Beam [id number]      I spent 20 minutes of face-to-face and non-face-to-face time with patient.  This included previsit chart review, lab review, study review, order entry, electronic health record documentation, patient education.    Debbora Presto, MSN, FNP-C 12/18/2020, 4:21 PM  [facility] [address], [place], [place] [address] [phone]

## 2020-12-18 NOTE — Patient Instructions (Signed)
Below is our plan:  We will continue continue ropinirole 0.25mg  twice daily. I will add gabapentin 100mg  at bedtime to see if this helps with RLS and numbness/tingling in hands. Monitor closely for sedative side effects.   Please make sure you are staying well hydrated. I recommend 50-60 ounces daily. Well balanced diet and regular exercise encouraged. Consistent sleep schedule with 6-8 hours recommended.   Please continue follow up with care team as directed.   Follow up with in 6 months   You may receive a survey regarding today's visit. I encourage you to leave honest feed back as I do use this information to improve patient care. Thank you for seeing me today!       Restless Legs Syndrome Restless legs syndrome is a condition that causes uncomfortable feelings or sensations in the legs, especially while sitting or lying down. The sensations usually cause an overwhelming urge to move the legs. The arms can also sometimes be affected. The condition can range from mild to severe. The symptoms often interfere with a person's ability to sleep. What are the causes? The cause of this condition is not known. What increases the risk? The following factors may make you more likely to develop this condition:  Being older than 50.  Pregnancy.  Being a woman. In general, the condition is more common in women than in men.  A family history of the condition.  Having iron deficiency.  Overuse of caffeine, nicotine, or alcohol.  Certain medical conditions, such as kidney disease, Parkinson's disease, or nerve damage.  Certain medicines, such as those for high blood pressure, nausea, colds, allergies, depression, and some heart conditions. What are the signs or symptoms? The main symptom of this condition is uncomfortable sensations in the legs, such as:  Pulling.  Tingling.  Prickling.  Throbbing.  Crawling.  Burning. Usually, the sensations:  Affect both sides of the  body.  Are worse when you sit or lie down.  Are worse at night. These may wake you up or make it difficult to fall asleep.  Make you have a strong urge to move your legs.  Are temporarily relieved by moving your legs. The arms can also be affected, but this is rare. People who have this condition often have tiredness during the day because of their lack of sleep at night. How is this diagnosed? This condition may be diagnosed based on:  Your symptoms.  Blood tests. In some cases, you may be monitored in a sleep lab by a specialist (a sleep study). This can detect any disruptions in your sleep. How is this treated? This condition is treated by managing the symptoms. This may include:  Lifestyle changes, such as exercising, using relaxation techniques, and avoiding caffeine, alcohol, or tobacco.  Medicines. Anti-seizure medicines may be tried first. Follow these instructions at home: General instructions  Take over-the-counter and prescription medicines only as told by your health care provider.  Use methods to help relieve the uncomfortable sensations, such as: ? Massaging your legs. ? Walking or stretching. ? Taking a cold or hot bath.  Keep all follow-up visits as told by your health care provider. This is important. Lifestyle  Practice good sleep habits. For example, go to bed and get up at the same time every day. Most adults should get 7-9 hours of sleep each night.  Exercise regularly. Try to get at least 30 minutes of exercise most days of the week.  Practice ways of relaxing, such as yoga or  meditation.  Avoid caffeine and alcohol.  Do not use any products that contain nicotine or tobacco, such as cigarettes and e-cigarettes. If you need help quitting, ask your health care provider.      Contact a health care provider if:  Your symptoms get worse or they do not improve with treatment. Summary  Restless legs syndrome is a condition that causes uncomfortable  feelings or sensations in the legs, especially while sitting or lying down.  The symptoms often interfere with a person's ability to sleep.  This condition is treated by managing the symptoms. You may need to make lifestyle changes or take medicines. This information is not intended to replace advice given to you by your health care provider. Make sure you discuss any questions you have with your health care provider. Document Revised: 12/09/2019 Document Reviewed: 11/09/2017 Elsevier Patient Education  2021 [employer].

## 2020-12-20 ENCOUNTER — Encounter: Payer: Self-pay | Admitting: Physician Assistant

## 2020-12-20 ENCOUNTER — Ambulatory Visit (INDEPENDENT_AMBULATORY_CARE_PROVIDER_SITE_OTHER): Payer: Medicare Other | Admitting: Physician Assistant

## 2020-12-20 ENCOUNTER — Other Ambulatory Visit (INDEPENDENT_AMBULATORY_CARE_PROVIDER_SITE_OTHER): Payer: Medicare Other

## 2020-12-20 VITALS — BP 124/72 | HR 69 | Ht 63.0 in | Wt 130.6 lb

## 2020-12-20 DIAGNOSIS — K439 Ventral hernia without obstruction or gangrene: Secondary | ICD-10-CM | POA: Diagnosis not present

## 2020-12-20 DIAGNOSIS — K5904 Chronic idiopathic constipation: Secondary | ICD-10-CM

## 2020-12-20 LAB — BASIC METABOLIC PANEL
BUN: 23 mg/dL (ref 6–23)
CO2: 28 mEq/L (ref 19–32)
Calcium: 9.4 mg/dL (ref 8.4–10.5)
Chloride: 99 mEq/L (ref 96–112)
Creatinine, Ser: 0.87 mg/dL (ref 0.40–1.20)
GFR: 60.75 mL/min (ref >60.00)
Glucose, Bld: 95 mg/dL (ref 70–99)
Potassium: 4 mEq/L (ref 3.5–5.1)
Sodium: 135 mEq/L (ref 135–145)

## 2020-12-20 NOTE — Progress Notes (Signed)
Subjective:    Patient ID: Holly Benson, female    DOB: [birth year], 87 y.o.   MRN: [id number]  HPI Holly Benson is a pleasant 87 year old white female, established with Dr. Silverio Decamp, and also known to myself.  She comes in today with complaints of abdominal bloating gas and constipation. She has history of a known large ventral hernia. Also with atrial fibrillation, history of a flutter, COPD, hypertension, peripheral vascular disease status post iliac artery stent, restless legs syndrome. She was last seen in the office in October 2021 with chronic constipation.  This was persisting despite Linzess 290 mcg daily and MiraLAX daily in addition to a recent colon cleanse.  She was switched to a trial of Amitiza twice daily, given a bowel purge, and also advised to continue MiraLAX daily. Patient does not feel that the Amitiza is effective.  She is taking 3 Colace tablets every day uses several Senokot on a as needed basis and has been using MiraLAX as needed rather than daily.  She says she had to give herself a bowel purge with MiraLAX and Gatorade again last week due to severe constipation. She has had some episodes of what she describes as "bad" gas pains and that was what initiated this office visit.  This is a full bloated trapped gas type of feeling.  She is feeling better and has not had any of those symptoms this week.  She says she did feel significantly better after the MiraLAX purge though it hurt her abdomen she was able to have multiple bowel movements. She feels that the hernia is causing a lot of issues.  She is now trying to wear a girdle on a regular basis which she says does make her more comfortable.  On further discussion she relates that she had been seen by general surgery a couple of years ago/Chelsea Kae Heller who told her that she would not operate on her hernia due to her advanced age.  Also feels that the gassiness may have increased after she recently restarted Benefiber which she had  been off of for multiple weeks while she was out of town.  She has since stopped that again.  She says that the hernia at times will get very hard and uncomfortable and if she is able to pass gas or have bowel movements symptoms improve.  Review of Systems Pertinent positive and negative review of systems were noted in the above HPI section.  All other review of systems was otherwise negative.  Outpatient Encounter Medications as of 12/20/2020  Medication Sig  . acetaminophen (TYLENOL) 325 MG tablet Take 650 mg by mouth every 6 (six) hours as needed for moderate pain or headache.  . albuterol (VENTOLIN HFA) 108 (90 Base) MCG/ACT inhaler Inhale 2 puffs into the lungs every 6 (six) hours as needed for wheezing or shortness of breath.  Marland Kitchen alendronate (FOSAMAX) 70 MG tablet Take 70 mg by mouth once a week.  Marland Kitchen amiodarone (PACERONE) 100 MG tablet Take 1 tablet (100 mg total) by mouth daily.  . AMITIZA 24 MCG capsule Take 1 capsule (24 mcg total) by mouth 2 (two) times daily with a meal.  . Carboxymethylcellulose Sodium (THERATEARS OP) Place 1 drop into both eyes daily as needed (dry eyes).  . cetirizine (ZYRTEC) 10 MG tablet Take 10 mg by mouth daily as needed for allergies.   . Coenzyme Q10 (COQ-10) 100 MG CAPS Take 100 mg by mouth in the morning and at bedtime.  Marland Kitchen ELIQUIS 5 MG TABS tablet  TAKE 1 TABLET TWICE A DAY.  Marland Kitchen gabapentin (NEURONTIN) 100 MG capsule Take 1 capsule (100 mg total) by mouth 2 (two) times daily.  . hydrALAZINE (APRESOLINE) 10 MG tablet Take 10 mg by mouth 2 (two) times daily.  . hydrochlorothiazide (HYDRODIURIL) 25 MG tablet Take 1 tablet (25 mg total) by mouth daily.  . irbesartan (AVAPRO) 75 MG tablet TAKE ONE TABLET BY MOUTH DAILY  . Misc Natural Products (COLON CLEANSE) CAPS Take 4 capsules by mouth daily as needed (constipation).  Marland Kitchen OVER THE COUNTER MEDICATION Take 1 tablet by mouth daily. drenatrophin pmg otc supplement  . polyethylene glycol (MIRALAX / GLYCOLAX) 17 g packet  Take 17 g by mouth daily as needed for moderate constipation.  Marland Kitchen rOPINIRole (REQUIP) 0.25 MG tablet Take 1 tablet (0.25 mg total) by mouth 2 (two) times daily as needed (RLS).  . STIOLTO RESPIMAT 2.5-2.5 MCG/ACT AERS USE 2 INHALATIONS ORALLY   EVERY MORNING   No facility-administered encounter medications on file as of 12/20/2020.   Allergies  Allergen Reactions  . Lovenox [Enoxaparin Sodium] Hives and Rash    PT STATES SHE BROKE OUT IN A RASH HEAD TO TOE AND LASTED ABOUT 3 WEEKS   . Metoprolol     Muscle aches   Patient Active Problem List   Diagnosis Date Noted  . PVD (peripheral vascular disease) ([place]) 08/01/2020  . Chronic anticoagulation 08/01/2020  . 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  . 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 Iron deficiency anemia due to chronic blood loss 07/30/2020  . RLS (restless legs syndrome) 07/30/2020  . Typical atrial flutter ([place]) 01/12/2018  . Cardiomyopathy ([place]) 11/10/2017  . Shortness of breath 11/10/2017  . Heart palpitations 09/10/2017  . PAF (paroxysmal atrial fibrillation) ([place]) 09/10/2017  . Essential hypertension 09/10/2017  . Mixed hyperlipidemia 09/10/2017  . COPD (chronic obstructive pulmonary disease) ([place]) 07/21/2017  . Cough 07/21/2017   Social History   Socioeconomic History  . Marital status: Widowed    Spouse name: Not on file  . Number of children: 3  . Years of education: Not on file  . Highest education level: Not on file  Occupational History  . Occupation: retired    Comment: retired Licensed conveyancer  Tobacco Use  . Smoking status: Former Smoker    Packs/day: 1.50    Years: 30.00    Pack years: 45.00    Types: Cigarettes    Quit date: 11/04/1991    Years since quitting: 29.1  . Smokeless tobacco: Never Used  Vaping Use  . Vaping Use: Never used  Substance and Sexual Activity  . Alcohol use: Yes    Alcohol/week: 1.0 standard drink    Types: 1 Glasses of wine per week  . Drug use: No  . Sexual  activity: Not Currently  Other Topics Concern  . Not on file  Social History Narrative   Lives with daughter   Social Determinants of Health   Financial Resource Strain: Not on file  Food Insecurity: Not on file  Transportation Needs: Not on file  Physical Activity: Not on file  Stress: Not on file  Social Connections: Not on file  Intimate Partner Violence: Not on file    Ms. Boulter's family history includes Hypertension in her sister; Stroke in her maternal grandfather; Suicidality in her father.      Objective:    Vitals:   12/20/20 1043  BP: 124/72  Pulse: 69  SpO2: 96%    Physical Exam Well-developed well-nourished elderly white  female in no acute distress.  Very pleasant height, Weight, 130 BMI 23.1  HEENT; nontraumatic normocephalic, EOMI, PE R LA, sclera anicteric. Oropharynx; not examined today Neck; supple, no JVD Cardiovascular; regular rate and rhythm with S1-S2, no murmur rub or gallop Pulmonary; Clear bilaterally Abdomen; soft, there is a large melon sized ventral hernia to the right of the umbilicus, currently soft and mostly reducible,, nondistended, no palpable mass or hepatosplenomegaly, bowel sounds are active Rectal; not done today Skin; benign exam, no jaundice rash or appreciable lesions Extremities; no clubbing cyanosis or edema skin warm and dry Neuro/Psych; alert and oriented x4, grossly nonfocal mood and affect appropriate       Assessment & Plan:   #17 87 year old white female with chronic constipation.  She has been unresponsive to Linzess 290 and Amitiza 24 mcg twice daily. She seems to do best with a combination of over-the-counter laxatives with Colace and Senokot.  MiraLAX seems to help as well but she has not been taking that regularly though has used for a purge with good success. I think her constipation is being affected by the large ventral hernia and suspect she has bowel in the hernia. She reports episodes of intense gas pains  in her mid abdomen around the area of the hernia and the hernia becomes hard and uncomfortable at times.  I suspect she is having some intermittent partial incarceration.  #2 history of atrial fibrillation/a flutter 3.  Restless leg syndrome 4.  Peripheral vascular disease status post iliac artery stent 5.  COPD 6.  Hypertension  Plan;  schedule for CT of the abdomen and pelvis with contrast. Be met today Stop fiber supplements as may be increasing bloating. Start MiraLAX 17 g in 8 ounces of water twice daily. We also discussed increasing water intake to at least 60 ounces per day. She may continue Colace 2 to 3 tablets daily if she finds this helpful though may not need with regular twice daily MiraLAX. Continue Senokot as needed We will also give her a trial of Trulance 3 mg p.o. daily.  She was provided with samples today and hopefully will have more samples to offer her in a few days.  I had like her to try this for a couple of weeks prior to prescribing. Continue wearing abdominal compression garment Further recommendations pending findings of CT.  Ikenna Ohms S Zaniel Marineau PA-C 12/20/2020   Cc: Michael Boston, MD

## 2020-12-20 NOTE — Patient Instructions (Addendum)
If you are age 85 or older, your body mass index should be between 23-30. Your Body mass index is 23.13 kg/m. If this is out of the aforementioned range listed, please consider follow up with your Primary Care Provider.  If you are age 14 or younger, your body mass index should be between 19-25. Your Body mass index is 23.13 kg/m. If this is out of the aformentioned range listed, please consider follow up with your Primary Care Provider.   Your provider has requested that you go to the basement level for lab work before leaving today. Press "B" on the elevator. The lab is located at the first door on the left as you exit the elevator.  You will be contacted by [facility] in the next 2 days to arrange a CT Abdomen/Pelvis.  The number on your caller ID will be [phone], please answer when they call.  If you have not heard from them in 2 days please call [phone] to schedule.    STOP Amitiza and Benefiber  Continue Colace as needed. Start Miralax 1 capful in 8 ounces of water or juice 2 times a day.  Start a trial of Trulance 3 mg 1 capsule every morning. We will be getting in more samples tomorrow, give [place] a call tomorrow and check if the did come in so we can get you a few together to pick up.  Follow up pending at this time, or as needed.  Thank you for entrusting me with your care and choosing [facility].  Amy Esterwood, PA-C

## 2020-12-27 DIAGNOSIS — G720 Drug-induced myopathy: Secondary | ICD-10-CM | POA: Diagnosis not present

## 2020-12-27 DIAGNOSIS — R5383 Other fatigue: Secondary | ICD-10-CM | POA: Diagnosis not present

## 2020-12-27 DIAGNOSIS — E785 Hyperlipidemia, unspecified: Secondary | ICD-10-CM | POA: Diagnosis not present

## 2020-12-27 DIAGNOSIS — R202 Paresthesia of skin: Secondary | ICD-10-CM | POA: Diagnosis not present

## 2020-12-27 DIAGNOSIS — I1310 Hypertensive heart and chronic kidney disease without heart failure, with stage 1 through stage 4 chronic kidney disease, or unspecified chronic kidney disease: Secondary | ICD-10-CM | POA: Diagnosis not present

## 2020-12-27 DIAGNOSIS — Z79899 Other long term (current) drug therapy: Secondary | ICD-10-CM | POA: Diagnosis not present

## 2020-12-27 DIAGNOSIS — J449 Chronic obstructive pulmonary disease, unspecified: Secondary | ICD-10-CM | POA: Diagnosis not present

## 2020-12-27 DIAGNOSIS — T466X5D Adverse effect of antihyperlipidemic and antiarteriosclerotic drugs, subsequent encounter: Secondary | ICD-10-CM | POA: Diagnosis not present

## 2020-12-27 DIAGNOSIS — G2581 Restless legs syndrome: Secondary | ICD-10-CM | POA: Diagnosis not present

## 2020-12-27 DIAGNOSIS — I480 Paroxysmal atrial fibrillation: Secondary | ICD-10-CM | POA: Diagnosis not present

## 2020-12-27 DIAGNOSIS — D6869 Other thrombophilia: Secondary | ICD-10-CM | POA: Diagnosis not present

## 2020-12-27 DIAGNOSIS — I739 Peripheral vascular disease, unspecified: Secondary | ICD-10-CM | POA: Diagnosis not present

## 2020-12-31 NOTE — Progress Notes (Signed)
Reviewed and agree with documentation and assessment and plan. K. Veena Annisten [place] , MD   

## 2020-12-31 NOTE — Telephone Encounter (Signed)
Called and spoke with patient who states she has had unusual shortness of breath that started 3-4 days. States that when she wakes up in the morning it is really bad. BP has been low.  My oximeter readings have been in the 80s after activity. Patient states that at this moment her sats are 91%.   A rescue inhaler helps some and using Stiolto in morning. Patient feels like she has to clear her thoat all the time and has phlegm that she coughs up with no color.   Dr. Lamonte Sakai please advise

## 2021-01-02 NOTE — Telephone Encounter (Signed)
Spoke with patient of Dr. Debara Pickett who reports increasing shortness of breath since this past weekend. She reports her O2 levels have been in the 80s. She walked up the steps today (was gasping) and her O2 was 77%. She reports consistent O2 readings in the 80s. She does use a rescue inhaler. She reports shortness of breath is worse when she wakes up - uses CPAP regularly - had to use rescue inhaler at 3:30am. She denies weight gain/swelling. She states her BP is "up & down" - 773P systolic yesterday and today -- has been in the 110s. She reports HR is generally in 60s-70s.   She contacted pulmonary but has not heard back  Patient also reports she is having issues with her DME company ([employer]). She said the company told her she received "new supplies" but she never got this. She wanted to get a new mask. She reports her headgear is stretched out. Will send message to [facility] for assistance

## 2021-01-02 NOTE — Telephone Encounter (Signed)
She needs to be seen. Set her up w OV w RB or APP this week.

## 2021-01-02 NOTE — Telephone Encounter (Signed)
Pixie Casino, MD  You 19 minutes ago (4:05 PM)     Sounds like this is pulmonary- if not improving, would seek out urgent care or ER for evaluation.   Dr Luciano Cutter with patient and relayed MD advice. Suggested she call pulmonary office again and if unable to obtain assistance/advice, seek UC or ED eval. Advised low O2 sats is not normal and should be evaluated.

## 2021-01-03 NOTE — Telephone Encounter (Signed)
Spoke with pt and scheduled for OV with Dr. Lamonte Sakai at 9:30 on 01/04/2021. Pt stated understanding. Nothing further needed at this time.

## 2021-01-03 NOTE — Telephone Encounter (Signed)
Checked the APP schedules, no one has any appts for today or tomorrow.   RB, you do have some held spots for today. You would be ok with [place] using one of these spots for her?

## 2021-01-03 NOTE — Telephone Encounter (Signed)
Put her in one of my held spots for 3/4

## 2021-01-04 ENCOUNTER — Ambulatory Visit (HOSPITAL_COMMUNITY)
Admission: RE | Admit: 2021-01-04 | Discharge: 2021-01-04 | Disposition: A | Discharge status: Home / Self Care | Payer: Medicare Other | Source: Ambulatory Visit | Attending: Physician Assistant | Admitting: Physician Assistant

## 2021-01-04 ENCOUNTER — Other Ambulatory Visit: Payer: Self-pay

## 2021-01-04 ENCOUNTER — Ambulatory Visit (INDEPENDENT_AMBULATORY_CARE_PROVIDER_SITE_OTHER): Payer: Medicare Other

## 2021-01-04 ENCOUNTER — Encounter (HOSPITAL_COMMUNITY): Payer: Self-pay

## 2021-01-04 ENCOUNTER — Ambulatory Visit (INDEPENDENT_AMBULATORY_CARE_PROVIDER_SITE_OTHER): Payer: Medicare Other | Admitting: Emergency Medicine

## 2021-01-04 ENCOUNTER — Encounter: Payer: Self-pay | Admitting: Emergency Medicine

## 2021-01-04 DIAGNOSIS — K5904 Chronic idiopathic constipation: Secondary | ICD-10-CM | POA: Diagnosis not present

## 2021-01-04 DIAGNOSIS — J811 Chronic pulmonary edema: Secondary | ICD-10-CM | POA: Diagnosis not present

## 2021-01-04 DIAGNOSIS — J189 Pneumonia, unspecified organism: Secondary | ICD-10-CM | POA: Diagnosis not present

## 2021-01-04 DIAGNOSIS — K439 Ventral hernia without obstruction or gangrene: Secondary | ICD-10-CM | POA: Diagnosis not present

## 2021-01-04 DIAGNOSIS — G4731 Primary central sleep apnea: Secondary | ICD-10-CM | POA: Diagnosis not present

## 2021-01-04 DIAGNOSIS — J438 Other emphysema: Secondary | ICD-10-CM

## 2021-01-04 DIAGNOSIS — K469 Unspecified abdominal hernia without obstruction or gangrene: Secondary | ICD-10-CM | POA: Diagnosis not present

## 2021-01-04 DIAGNOSIS — I708 Atherosclerosis of other arteries: Secondary | ICD-10-CM | POA: Diagnosis not present

## 2021-01-04 DIAGNOSIS — G4739 Other sleep apnea: Secondary | ICD-10-CM

## 2021-01-04 DIAGNOSIS — J439 Emphysema, unspecified: Secondary | ICD-10-CM | POA: Diagnosis not present

## 2021-01-04 DIAGNOSIS — R0602 Shortness of breath: Secondary | ICD-10-CM | POA: Diagnosis not present

## 2021-01-04 DIAGNOSIS — N281 Cyst of kidney, acquired: Secondary | ICD-10-CM | POA: Diagnosis not present

## 2021-01-04 DIAGNOSIS — J90 Pleural effusion, not elsewhere classified: Secondary | ICD-10-CM | POA: Diagnosis not present

## 2021-01-04 MED ORDER — PREDNISONE 20 MG PO TABS: 20.0000 mg | ORAL_TABLET | Freq: Every day | ORAL | 0 refills | Status: DC

## 2021-01-04 MED ORDER — IOHEXOL 300 MG/ML  SOLN
100.0000 mL | Freq: Once | INTRAMUSCULAR | Status: AC | PRN
  Administered 2021-01-04: 100 mL via INTRAVENOUS

## 2021-01-04 NOTE — Assessment & Plan Note (Signed)
Tells me that she uses CPAP.  Her previous sleep study recommended a Servo vent.  Dr. Claiborne Billings manages.  I will get her current settings from [facility]

## 2021-01-04 NOTE — Assessment & Plan Note (Addendum)
There has been an acute change but she is not wheezing on exam.  She does have some mild crackles.  Chest x-ray pending as she does have a history of hypertension and is also on amiodarone.  No infectious prodrome but we will rule out pneumonia.  We will continue her current bronchodilator regimen.  I will give her prednisone for 5 days to see if there is a component of mild flaring that we can improve upon.  She has seen episodes of desaturation at home.  Walking oximetry today to see if she qualifies for supplemental oxygen.

## 2021-01-04 NOTE — Progress Notes (Signed)
Subjective:    Patient ID: Holly Benson, female    DOB: 05-08-1933, 87 y.o.   MRN: [id number]  COPD She complains of cough and shortness of breath. There is no wheezing. Pertinent negatives include no ear pain, fever, headaches, postnasal drip, rhinorrhea, sneezing, sore throat or trouble swallowing. Her past medical history is significant for COPD.   ROV 07/12/20 --87 year old former smoker with COPD, chronic cough in the setting of GERD and allergic rhinitis.  She also has atrial fibrillation and hypertension, obstructive sleep apnea-titration study 07/14/2019 reviewed, showed recommendations for servo-vent 15/5 with PS 4 cmH2O >> she says she sleeps on a CPAP, her DME is [employer]. She describes a lot of irritation in her throat, increased throat clearing. She had a bout of bad reflux recently that set her throat irritation off. She doesn't feel any reflux currently. May have been the inciting even to get cough started.  Currently managed on Stiolto, doesn't have an albuterol right now, was using very rarely. COVID vaccine up to date.  Remains on Zyrtec as needed - not taking currently, does use some homeopathic allergy meds.  Not on any GERD regimen, planning to follow with Dr Silverio Decamp.  Acute OV 01/04/21 --this is an acute office visit for Holly Benson.  She is 87 with COPD, chronic cough that are both influenced by GERD and allergic rhinitis.  Also with a history of atrial fibrillation, hypertension, OSA/OHS for which she uses  She has been experiencing increased shortness of breath over the 5 days prior to this visit has noticed some desaturations into the 80s.  Has had some benefit from her albuterol.  She remains on Stiolto.  She has upper airway irritation, is clearing her throat frequently with increased clear mucus. No wheeze, minimal cough. Not really feeling any reflux. Stable baseline ankle edema. She restarted zyrtec 1 week acute. Using albuterol 2x a day.   MDM: Reviewed gastroenterology  notes 12/20/2020 Reviewed neurology notes 12/18/2020   Review of Systems  Constitutional: Negative for fever and unexpected weight change.  HENT: Negative for congestion, dental problem, ear pain, nosebleeds, postnasal drip, rhinorrhea, sinus pressure, sneezing, sore throat and trouble swallowing.   Eyes: Negative for redness and itching.  Respiratory: Positive for cough and shortness of breath. Negative for chest tightness and wheezing.   Cardiovascular: Negative for palpitations and leg swelling.  Gastrointestinal: Negative for nausea and vomiting.  Genitourinary: Negative for dysuria.  Musculoskeletal: Negative for joint swelling.  Skin: Negative for rash.  Neurological: Negative for headaches.  Hematological: Does not bruise/bleed easily.  Psychiatric/Behavioral: Negative for dysphoric mood. The patient is not nervous/anxious.     Past Medical History:  Diagnosis Date  . Arrhythmia   . Arthritis   . Atrial fibrillation ([place])   . Constipation    chronic  . COPD (chronic obstructive pulmonary disease) ([place])   . Hyperlipidemia   . Hypertension    pulmonary  . OSA on CPAP   . Osteoporosis   . RLS (restless legs syndrome)   . Sleep apnea      Family History  Problem Relation Age of Onset  . Suicidality Father   . Stroke Maternal Grandfather   . Hypertension Sister   . Colon cancer Neg Hx   . Esophageal cancer Neg Hx   . Pancreatic cancer Neg Hx   . Stomach cancer Neg Hx   . Liver disease Neg Hx      Social History   Socioeconomic History  . Marital status: Widowed  Spouse name: Not on file  . Number of children: 3  . Years of education: Not on file  . Highest education level: Not on file  Occupational History  . Occupation: retired    Comment: retired Licensed conveyancer  Tobacco Use  . Smoking status: Former Smoker    Packs/day: 1.50    Years: 30.00    Pack years: 45.00    Types: Cigarettes    Start date: 84    Quit date: 11/04/1991    Years since quitting:  29.1  . Smokeless tobacco: Never Used  Vaping Use  . Vaping Use: Never used  Substance and Sexual Activity  . Alcohol use: Yes    Alcohol/week: 1.0 standard drink    Types: 1 Glasses of wine per week  . Drug use: No  . Sexual activity: Not Currently  Other Topics Concern  . 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  No known TB  Allergies  Allergen Reactions  . Lovenox [Enoxaparin Sodium] Hives and Rash    PT STATES SHE BROKE OUT IN A RASH HEAD TO TOE AND LASTED ABOUT 3 WEEKS   . Metoprolol     Muscle aches     Outpatient Medications Prior to Visit  Medication Sig Dispense Refill  . acetaminophen (TYLENOL) 325 MG tablet Take 650 mg by mouth every 6 (six) hours as needed for moderate pain or headache.    . albuterol (VENTOLIN HFA) 108 (90 Base) MCG/ACT inhaler Inhale 2 puffs into the lungs every 6 (six) hours as needed for wheezing or shortness of breath. 18 g 6  . alendronate (FOSAMAX) 70 MG tablet Take 70 mg by mouth once a week.    Marland Kitchen amiodarone (PACERONE) 100 MG tablet Take 1 tablet (100 mg total) by mouth daily. 90 tablet 3  . Carboxymethylcellulose Sodium (THERATEARS OP) Place 1 drop into both eyes daily as needed (dry eyes).    . cetirizine (ZYRTEC) 10 MG tablet Take 10 mg by mouth daily as needed for allergies.     . Coenzyme Q10 (COQ-10) 100 MG CAPS Take 100 mg by mouth in the morning and at bedtime.    Marland Kitchen ELIQUIS 5 MG TABS tablet TAKE 1 TABLET TWICE A DAY. 180 tablet 2  . gabapentin (NEURONTIN) 100 MG capsule Take 1 capsule (100 mg total) by mouth 2 (two) times daily. 60 capsule 1  . hydrALAZINE (APRESOLINE) 10 MG  tablet Take 10 mg by mouth 2 (two) times daily.    . hydrochlorothiazide (HYDRODIURIL) 25 MG tablet Take 1 tablet (25 mg total) by mouth daily. 90 tablet 3  . irbesartan (AVAPRO) 75 MG tablet TAKE ONE TABLET BY MOUTH DAILY 90 tablet 3  . OVER THE COUNTER MEDICATION Take 1 tablet by mouth daily. drenatrophin pmg otc supplement    . polyethylene glycol (MIRALAX / GLYCOLAX) 17 g packet Take 17 g by mouth daily as needed for moderate constipation.    Marland Kitchen rOPINIRole (REQUIP) 0.25 MG tablet Take 1 tablet (0.25 mg total) by mouth 2 (two) times daily as needed (RLS). 60 tablet 5  . STIOLTO RESPIMAT 2.5-2.5 MCG/ACT  AERS USE 2 INHALATIONS ORALLY   EVERY MORNING 12 g 3  . Misc Natural Products (COLON CLEANSE) CAPS Take 4 capsules by mouth daily as needed (constipation). (Patient not taking: Reported on 01/04/2021)    . AMITIZA 24 MCG capsule Take 1 capsule (24 mcg total) by mouth 2 (two) times daily with a meal. 180 capsule 3   No facility-administered medications prior to visit.        Objective:   Physical Exam Vitals:   01/04/21 0913  BP: (!) 114/58  Pulse: 79  Temp: (!) 97 F (36.1 C)  TempSrc: Temporal  SpO2: 93%  Weight: 129 lb 9.6 oz (58.8 kg)  Height: 5\' 3"  (1.6 m)   Gen: Pleasant, thin woman, in no distress,  normal affect, mild kyphosis  ENT: No lesions,  mouth clear,  oropharynx clear, no postnasal drip  Neck: No JVD, no stridor  Lungs: No use of accessory muscles, few bibasilar inspiratory crackles  Cardiovascular: RRR, heart sounds normal, no murmur or gallops, no peripheral edema  Musculoskeletal: No deformities, no cyanosis or clubbing  Neuro: alert, non focal  Skin: Warm, no lesions or rashes      Assessment & Plan:  COPD (chronic obstructive pulmonary disease) (HCC) There has been an acute change but she is not wheezing on exam.  She does have some mild crackles.  Chest x-ray pending as she does have a history of hypertension and is also on amiodarone.  No infectious  prodrome but we will rule out pneumonia.  We will continue her current bronchodilator regimen.  I will give her prednisone for 5 days to see if there is a component of mild flaring that we can improve upon.  She has seen episodes of desaturation at home.  Walking oximetry today to see if she qualifies for supplemental oxygen.  Complex sleep apnea syndrome Tells me that she uses CPAP.  Her previous sleep study recommended a Servo vent.  Dr. Claiborne Billings manages.  I will get her current settings from [facility]  Baltazar Apo, MD, PhD 01/04/2021, 9:41 AM [facility] [phone] or if no answer [phone]

## 2021-01-04 NOTE — Addendum Note (Signed)
Addended by: Lorretta Harp on: 01/04/2021 09:47 AM   Modules accepted: Orders

## 2021-01-04 NOTE — Patient Instructions (Addendum)
We will find out your CPAP settings from [facility]. Continue every night Chest x-ray today to evaluate your shortness of breath Please continue your loratadine 10mg  daily through the Spring allergy season. Then you can change to as needed.  Walking oximetry on room air today Continue your Stiolto 2 puffs once a day Continue your albuterol 2 puffs up to every 4 hours if needed for shortness of breath, chest tightness, wheezing Please take prednisone 20 mg once daily for the next 5 days. Please follow with either Dr. Lamonte Sakai or APP here in the office in 2 weeks so that we can assess your progress. Otherwise follow with Dr. Lamonte Sakai in 6 months or sooner if needed.

## 2021-01-05 DIAGNOSIS — J849 Interstitial pulmonary disease, unspecified: Secondary | ICD-10-CM

## 2021-01-06 ENCOUNTER — Encounter: Payer: Self-pay | Admitting: Family Medicine

## 2021-01-07 ENCOUNTER — Ambulatory Visit: Payer: Medicare Other | Admitting: Acute Care

## 2021-01-07 NOTE — Telephone Encounter (Signed)
Dr Lamonte Sakai, pt has viewed her cxr 3/5/2 results in Mychart and is asking for an interpretation of the results. Please advise, thanks!

## 2021-01-07 NOTE — Telephone Encounter (Signed)
PLease let her know that I looked at her CXR and also her CT abdomen (which gives me a partial look at her lungs) - these both show some evidence for either some residual fluid in the lungs ( from her heart failure) or some lung inflammation. There is no overt pneumonia.   I would like for her to get a high res CT chest without contrast so I can eval further. Reason > eval for ILD.   Thanks.

## 2021-01-08 NOTE — Telephone Encounter (Signed)
Let patient know recommendations.  Order placed Nothing further needed at this time.

## 2021-01-10 ENCOUNTER — Telehealth: Payer: Self-pay | Admitting: *Deleted

## 2021-01-10 NOTE — Telephone Encounter (Signed)
Left message for her to call [facility] and speak with Anderson Malta to see if she may have some suggestions on what she can do in reference to her humidity level.

## 2021-01-14 DIAGNOSIS — H02889 Meibomian gland dysfunction of unspecified eye, unspecified eyelid: Secondary | ICD-10-CM | POA: Diagnosis not present

## 2021-01-14 DIAGNOSIS — Z961 Presence of intraocular lens: Secondary | ICD-10-CM | POA: Diagnosis not present

## 2021-01-14 DIAGNOSIS — H1849 Other corneal degeneration: Secondary | ICD-10-CM | POA: Diagnosis not present

## 2021-01-14 DIAGNOSIS — H04123 Dry eye syndrome of bilateral lacrimal glands: Secondary | ICD-10-CM | POA: Diagnosis not present

## 2021-01-14 DIAGNOSIS — H353130 Nonexudative age-related macular degeneration, bilateral, stage unspecified: Secondary | ICD-10-CM | POA: Diagnosis not present

## 2021-01-15 ENCOUNTER — Other Ambulatory Visit: Payer: Self-pay

## 2021-01-15 ENCOUNTER — Ambulatory Visit (HOSPITAL_COMMUNITY)
Admission: RE | Admit: 2021-01-15 | Discharge: 2021-01-15 | Disposition: A | Discharge status: Home / Self Care | Payer: Medicare Other | Source: Ambulatory Visit | Attending: Emergency Medicine | Admitting: Emergency Medicine

## 2021-01-15 DIAGNOSIS — I2510 Atherosclerotic heart disease of native coronary artery without angina pectoris: Secondary | ICD-10-CM | POA: Diagnosis not present

## 2021-01-15 DIAGNOSIS — J479 Bronchiectasis, uncomplicated: Secondary | ICD-10-CM | POA: Diagnosis not present

## 2021-01-15 DIAGNOSIS — J849 Interstitial pulmonary disease, unspecified: Secondary | ICD-10-CM | POA: Diagnosis not present

## 2021-01-15 DIAGNOSIS — M47814 Spondylosis without myelopathy or radiculopathy, thoracic region: Secondary | ICD-10-CM | POA: Diagnosis not present

## 2021-01-15 DIAGNOSIS — J432 Centrilobular emphysema: Secondary | ICD-10-CM | POA: Diagnosis not present

## 2021-01-16 ENCOUNTER — Other Ambulatory Visit: Payer: Self-pay | Admitting: Adult Health

## 2021-01-17 ENCOUNTER — Other Ambulatory Visit: Payer: Self-pay

## 2021-01-17 ENCOUNTER — Encounter: Payer: Self-pay | Admitting: Gastroenterology

## 2021-01-17 ENCOUNTER — Ambulatory Visit (INDEPENDENT_AMBULATORY_CARE_PROVIDER_SITE_OTHER): Payer: Medicare Other | Admitting: Gastroenterology

## 2021-01-17 VITALS — BP 122/62 | HR 60 | Ht 63.0 in | Wt 129.0 lb

## 2021-01-17 DIAGNOSIS — K5904 Chronic idiopathic constipation: Secondary | ICD-10-CM

## 2021-01-17 MED ORDER — TRULANCE 3 MG PO TABS: 1.0000 | ORAL_TABLET | Freq: Every day | ORAL | 3 refills | Status: DC

## 2021-01-17 NOTE — Patient Instructions (Signed)
We have sent Trulance to your mail order pharmacy  Take IBGard 1 capsule three times a day as needed  Take Gas X 1 capsule 2-3 times daily  Use OTC Lactacid pills 1-2 tablets when you have any milk products  Due to recent changes in healthcare laws, you may see the results of your imaging and laboratory studies on MyChart before your provider has had a chance to review them.  We understand that in some cases there may be results that are confusing or concerning to you. Not all laboratory results come back in the same time frame and the provider may be waiting for multiple results in order to interpret others.  Please give [place] 48 hours in order for your provider to thoroughly review all the results before contacting the office for clarification of your results.   I appreciate the  opportunity to care for you  Thank You   Harl Bowie , MD

## 2021-01-17 NOTE — Progress Notes (Signed)
Holly Benson    [id number]    04/19/1933  Primary Care Physician:Wile, Jesse Sans, MD  Referring Physician: Michael Benson, [address] [place],  [place] [address]   Chief complaint: Ventral hernia  HPI:  87 year old very pleasant female with multiple comorbidities including hypertension, proximal A. fib, COPD, cardiomyopathy here for follow-up for chronic idiopathic constipation  She is currently using MiraLAX 1 capful twice daily and also Trulance 3 mg daily with some improvement of her bowel habits.  She continues to have alternating constipation and diarrhea.  She has intermittent discomfort with the ventral hernia.  Complaint worsening abdominal bloating and excessive gas, worse after she has a meal.  She usually does not drink milk but does eat cheese and ice cream.  No family history of colon cancer   CT chest high-resolution January 15, 2021 1. Multiple irregular masslike and nodular foci of consolidation in the peripheral peribronchovascular lungs bilaterally, predominantly in the mid to lower lungs, with associated mild-to-moderate bronchiectasis and patchy tree-in-bud opacities.  CT abdomen pelvis with contrast January 04, 2021 1. Moderate to large upper right-sided anterior abdominal wall hernia containing a portion of the transverse colon but no findings for incarceration. 2. Patchy multifocal airspace opacities at both lung bases likely an inflammatory or infectious process. Recommend correlation with patient's clinical symptoms. 3. Underlying emphysematous changes and pulmonary scarring. 4. Advanced atherosclerotic calcifications involving the aorta and iliac arteries.  Outpatient Encounter Medications as of 01/17/2021  Medication Sig  . acetaminophen (TYLENOL) 325 MG tablet Take 650 mg by mouth every 6 (six) hours as needed for moderate pain or headache.  . albuterol (VENTOLIN HFA) 108 (90 Base) MCG/ACT inhaler Inhale 2 puffs into the lungs  every 6 (six) hours as needed for wheezing or shortness of breath.  Marland Kitchen alendronate (FOSAMAX) 70 MG tablet Take 70 mg by mouth once a week.  Marland Kitchen amiodarone (PACERONE) 100 MG tablet Take 1 tablet (100 mg total) by mouth daily.  . Carboxymethylcellulose Sodium (THERATEARS OP) Place 1 drop into both eyes daily as needed (dry eyes).  . cetirizine (ZYRTEC) 10 MG tablet Take 10 mg by mouth daily as needed for allergies.   . Coenzyme Q10 (COQ-10) 100 MG CAPS Take 100 mg by mouth in the morning and at bedtime.  Marland Kitchen ELIQUIS 5 MG TABS tablet TAKE 1 TABLET TWICE A DAY.  Marland Kitchen gabapentin (NEURONTIN) 100 MG capsule Take 1 capsule (100 mg total) by mouth 2 (two) times daily.  . hydrALAZINE (APRESOLINE) 10 MG tablet Take 10 mg by mouth 2 (two) times daily.  . hydrochlorothiazide (HYDRODIURIL) 25 MG tablet Take 1 tablet (25 mg total) by mouth daily.  . irbesartan (AVAPRO) 75 MG tablet TAKE ONE TABLET BY MOUTH DAILY  . Misc Natural Products (COLON CLEANSE) CAPS Take 4 capsules by mouth daily as needed (constipation).  Marland Kitchen OVER THE COUNTER MEDICATION Take 1 tablet by mouth daily. drenatrophin pmg otc supplement  . polyethylene glycol (MIRALAX / GLYCOLAX) 17 g packet Take 17 g by mouth 2 (two) times daily.  Marland Kitchen rOPINIRole (REQUIP) 0.25 MG tablet Take 1 tablet (0.25 mg total) by mouth 2 (two) times daily as needed (RLS).  . STIOLTO RESPIMAT 2.5-2.5 MCG/ACT AERS USE 2 INHALATIONS ORALLY   EVERY MORNING  . [DISCONTINUED] predniSONE (DELTASONE) 20 MG tablet Take 1 tablet (20 mg total) by mouth daily with breakfast. (Patient not taking: Reported on 01/17/2021)   No facility-administered encounter medications on file as  of 01/17/2021.    Allergies as of 01/17/2021 - Review Complete 01/04/2021  Allergen Reaction Noted  . Lovenox [enoxaparin sodium] Hives and Rash 08/02/2019  . Metoprolol  12/26/2016    Past Medical History:  Diagnosis Date  . Arrhythmia   . Arthritis   . Atrial fibrillation ([place])   . Constipation    chronic   . COPD (chronic obstructive pulmonary disease) ([place])   . Hyperlipidemia   . Hypertension    pulmonary  . OSA on CPAP   . Osteoporosis   . RLS (restless legs syndrome)   . Sleep apnea     Past Surgical History:  Procedure Laterality Date  . ABDOMINAL AORTOGRAM W/LOWER EXTREMITY N/A 03/21/2020   Procedure: ABDOMINAL AORTOGRAM W/ Bilateral LOWER EXTREMITY [place];  Surgeon: Wellington Hampshire, MD;  Location: [facility];  Service: Cardiovascular;  Laterality: N/A;  . ABDOMINAL HYSTERECTOMY    . arm surgery Right    Broken arm and has a plate in it  . CATARACT EXTRACTION Bilateral   . COLONOSCOPY     More than 10 years ago In [facility]  . KNEE ARTHROSCOPY Right   . PERIPHERAL VASCULAR INTERVENTION Bilateral 03/21/2020   Procedure: PERIPHERAL VASCULAR INTERVENTION;  Surgeon: Wellington Hampshire, MD;  Location: [facility];  Service: Cardiovascular;  Laterality: Bilateral;  external iliac    Family History  Problem Relation Age of Onset  . Suicidality Father   . Stroke Maternal Grandfather   . Hypertension Sister   . Colon cancer Neg Hx   . Esophageal cancer Neg Hx   . Pancreatic cancer Neg Hx   . Stomach cancer Neg Hx   . Liver disease Neg Hx     Social History   Socioeconomic History  . Marital status: Widowed    Spouse name: Not on file  . Number of children: 3  . Years of education: Not on file  . Highest education level: Not on file  Occupational History  . Occupation: retired    Comment: retired Licensed conveyancer  Tobacco Use  . Smoking status: Former Smoker    Packs/day: 1.50    Years: 30.00    Pack years: 45.00    Types: Cigarettes    Start date: 19    Quit date: 11/04/1991    Years since quitting: 29.2  . Smokeless tobacco: Never Used  Vaping Use  . Vaping Use: Never used  Substance and Sexual Activity  . Alcohol use: Yes    Alcohol/week: 1.0 standard drink    Types: 1 Glasses of wine per week  . Drug use: No  . Sexual activity: Not Currently   Other Topics Concern  . Not on file  Social History Narrative   Lives with daughter   Social Determinants of Health   Financial Resource Strain: Not on file  Food Insecurity: Not on file  Transportation Needs: Not on file  Physical Activity: Not on file  Stress: Not on file  Social Connections: Not on file  Intimate Partner Violence: Not on file      Review of systems: All other review of systems negative except as mentioned in the HPI.   Physical Exam: Vitals:   01/17/21 0919  BP: 122/62  Pulse: 60  SpO2: 96%   Body mass index is 22.85 kg/m. Gen:      No acute distress HEENT:  sclera anicteric Abd:      soft, non-tender; no palpable masses, no distension, large ventral hernia with bowel,  easily able to reduce Ext:    No edema Neuro: alert and oriented x 3 Psych: normal mood and affect  Data Reviewed:  Reviewed labs, radiology imaging, old records and pertinent past GI work up   Assessment and Plan/Recommendations: 87 year old very pleasant female with history of hypertension, COPD, sleep apnea, cardiomyopathy, proximal A. fib, large ventral hernia with chronic idiopathic constipation  Continue Trulance 3 mg daily and MiraLAX once or twice daily as needed Increase dietary fiber and water intake to 8 to 10 cups daily  Abdominal bloating and excessive gas likely secondary to lactose intolerance Advised patient to use Lactaid tablets 1-2 whenever she consumes any milk or milk products Ok to use simethicone capsules 2-3 times daily after meals as needed for symptom relief Use IBgard 1 capsule up to 3 times daily as needed for severe abdominal bloating and cramping  She was evaluated by [facility] surgery for ventral hernia repair in the past and it was deferred given her age and comorbidities. Advised patient to continue to wear abdominal wall binder and avoid lifting heavy weights or excessive straining  Return in 1 year or sooner if needed   This  visit required 30 minutes of patient care (this includes precharting, chart review, review of results, face-to-face time used for counseling as well as treatment plan and follow-up. The patient was provided an opportunity to ask questions and all were answered. The patient agreed with the plan and demonstrated an understanding of the instructions.  Damaris Hippo , MD    CC: Michael Boston, MD

## 2021-01-18 ENCOUNTER — Ambulatory Visit (INDEPENDENT_AMBULATORY_CARE_PROVIDER_SITE_OTHER): Payer: Medicare Other | Admitting: Adult Health

## 2021-01-18 ENCOUNTER — Encounter: Payer: Self-pay | Admitting: Adult Health

## 2021-01-18 ENCOUNTER — Other Ambulatory Visit: Payer: Self-pay

## 2021-01-18 ENCOUNTER — Telehealth: Payer: Self-pay | Admitting: *Deleted

## 2021-01-18 VITALS — BP 112/68 | HR 72 | Temp 97.5°F | Ht 63.0 in | Wt 128.0 lb

## 2021-01-18 DIAGNOSIS — J441 Chronic obstructive pulmonary disease with (acute) exacerbation: Secondary | ICD-10-CM

## 2021-01-18 DIAGNOSIS — J849 Interstitial pulmonary disease, unspecified: Secondary | ICD-10-CM | POA: Diagnosis not present

## 2021-01-18 DIAGNOSIS — K5904 Chronic idiopathic constipation: Secondary | ICD-10-CM

## 2021-01-18 DIAGNOSIS — G4731 Primary central sleep apnea: Secondary | ICD-10-CM

## 2021-01-18 DIAGNOSIS — J189 Pneumonia, unspecified organism: Secondary | ICD-10-CM | POA: Insufficient documentation

## 2021-01-18 LAB — CBC WITH DIFFERENTIAL/PLATELET
Basophils Absolute: 0.1 10*3/uL (ref 0.0–0.1)
Basophils Relative: 1.1 % (ref 0.0–3.0)
Eosinophils Absolute: 0.1 10*3/uL (ref 0.0–0.7)
Eosinophils Relative: 1.1 % (ref 0.0–5.0)
HCT: 39.9 % (ref 36.0–46.0)
Hemoglobin: 13.4 g/dL (ref 12.0–15.0)
Lymphocytes Relative: 17.9 % (ref 12.0–46.0)
Lymphs Abs: 1.5 10*3/uL (ref 0.7–4.0)
MCHC: 33.5 g/dL (ref 30.0–36.0)
MCV: 96.2 fl (ref 78.0–100.0)
Monocytes Absolute: 1.1 10*3/uL — ABNORMAL HIGH (ref 0.1–1.0)
Monocytes Relative: 12.6 % — ABNORMAL HIGH (ref 3.0–12.0)
Neutro Abs: 5.7 10*3/uL (ref 1.4–7.7)
Neutrophils Relative %: 67.3 % (ref 43.0–77.0)
Platelets: 400 10*3/uL (ref 150.0–400.0)
RBC: 4.15 Mil/uL (ref 3.87–5.11)
RDW: 14.5 % (ref 11.5–15.5)
WBC: 8.5 10*3/uL (ref 4.0–10.5)

## 2021-01-18 LAB — SEDIMENTATION RATE: Sed Rate: 11 mm/hr (ref 0–30)

## 2021-01-18 MED ORDER — AMOXICILLIN-POT CLAVULANATE 875-125 MG PO TABS: 1.0000 | ORAL_TABLET | Freq: Two times a day (BID) | ORAL | 0 refills | Status: AC

## 2021-01-18 MED ORDER — PREDNISONE 10 MG PO TABS: ORAL_TABLET | ORAL | 0 refills | Status: DC

## 2021-01-18 NOTE — Assessment & Plan Note (Signed)
CT chest shows masslike consolidation with bronchiectasis bilaterally right greater than left.  Possible underlying aspiration pneumonia.  Also concerning for possible amiodarone induced pneumonitis. Patient had clinical benefit with steroids.  We will treat with empiric antibiotics and and low-dose steroids.  Have close follow-up in 2 weeks.  Repeat chest x-ray.  But will need a follow-up CT chest most likely in 3 months.   Labs including CBC and sed rate today  Would consider alternative to Fosamax and amiodarone if possible.  Plan  Patient Instructions  Begin Augmentin 875mg  Twice daily  For 7 days  Begin Prednisone 20mg  daily for 7 days and then 10mg  daily for 7 days and 5mg  daily for 7 days and stop .  Hold Fosamax .  Discuss with Cardiology regarding alternative to Amiodarone .  Continue on Stiolto 2 puffs daily  Aspiration precautions.  Follow up with Dr. Lamonte Sakai  In 2 weeks and As needed   Please contact office for sooner follow up if symptoms do not improve or worsen or seek emergency care

## 2021-01-18 NOTE — Progress Notes (Signed)
@Patient  ID: Holly Benson, female    DOB: 06/23/1933, 87 y.o.   MRN: [id number]  Chief Complaint  Patient presents with  . Follow-up    Referring provider: Michael Boston, MD  HPI: 87 year old female former smoker followed for COPD, chronic cough complicated by GERD and allergic rhinitis  Medical history significant for A. Fib on amiodarone, hypertension, obstructive sleep apnea on nocturnal CPAP managed by Dr. Claiborne Billings  TEST/EVENTS :  2D echo September 26, 2020 EF 6065%, moderately elevated pulmonary artery pressure, RVSP 48.7 mmHg.,  Right atrium severely dilated, left atrium severely dilated.  Moderate tricuspid valve regurg, mild to moderate aortic valve sclerosis  01/18/2021 Follow up: COPD  Patient presents for a 2-week follow-up.  Patient was seen last visit with a COPD flare with increased shortness of breath.  Complained of 3 weeks of dyspnea , quite severe with activity , drop in oxygen levels in 80s on room, checked on pulse ox at home. not on home oxygen. Had minimally nonproductive cough . No wheezing. Did not test for Covid . Fully vaccinated. Chest x-ray showed bilateral opacities right greater than left.  She was treated for COPD flare with Prednisone 20mg  daily for 5 days .  Patient is on amiodarone.  She was set up for a high-resolution CT chest that showed no enlarged lymph nodes.  Moderate emphysema, multiple irregular masslike and nodular foci of consolidation with surrounding patchy groundglass opacities bilaterally predominantly in the lower lungs, associated mild to moderate bronchiectasis, these are all new findings since 2011 comparison study.  She remains on Stiolto daily.  She says since last visit she is feeling better.  Has noticed that her shortness of breath has lessened but not totally gone back to baseline.  Her oxygen levels have improved now she remains in the 90s on room air with rest and at with activity.  Patient does report she started on Fosamax 3 weeks ago.   She says she does have intermittent dysphagia.  As above patient does remain on amiodarone.  She feels like the prednisone really helped her quite a bit. Patient is following with GI for chronic constipation with ongoing abdominal bloating and gas.,  Large ventricle hernia.    Allergies  Allergen Reactions  . Lovenox [Enoxaparin Sodium] Hives and Rash    PT STATES SHE BROKE OUT IN A RASH HEAD TO TOE AND LASTED ABOUT 3 WEEKS   . Metoprolol     Muscle aches    Immunization History  Administered Date(s) Administered  . Influenza, High Dose Seasonal PF 08/03/2017, 07/15/2018, 06/28/2019, 08/03/2020  . Influenza,inj,Quad PF,6+ Mos 08/02/2018  . PFIZER(Purple Top)SARS-COV-2 Vaccination 12/08/2019, 01/02/2020, 10/02/2020  . Zoster Recombinat (Shingrix) 12/25/2017    Past Medical History:  Diagnosis Date  . Arrhythmia   . Arthritis   . Atrial fibrillation ([place])   . Constipation    chronic  . COPD (chronic obstructive pulmonary disease) ([place])   . Hyperlipidemia   . Hypertension    pulmonary  . OSA on CPAP   . Osteoporosis   . RLS (restless legs syndrome)   . Sleep apnea     Tobacco History: Social History   Tobacco Use  Smoking Status Former Smoker  . Packs/day: 1.50  . Years: 30.00  . Pack years: 45.00  . Types: Cigarettes  . Start date: 56  . Quit date: 11/04/1991  . Years since quitting: 29.2  Smokeless Tobacco Never Used   Counseling given: Not Answered   Outpatient Medications Prior to  Visit  Medication Sig Dispense Refill  . acetaminophen (TYLENOL) 325 MG tablet Take 650 mg by mouth every 6 (six) hours as needed for moderate pain or headache.    . albuterol (VENTOLIN HFA) 108 (90 Base) MCG/ACT inhaler Inhale 2 puffs into the lungs every 6 (six) hours as needed for wheezing or shortness of breath. 18 g 6  . alendronate (FOSAMAX) 70 MG tablet Take 70 mg by mouth once a week.    Marland Kitchen amiodarone (PACERONE) 100 MG tablet Take 1 tablet (100 mg total) by mouth daily. 90  tablet 3  . Carboxymethylcellulose Sodium (THERATEARS OP) Place 1 drop into both eyes daily as needed (dry eyes).    . cetirizine (ZYRTEC) 10 MG tablet Take 10 mg by mouth daily as needed for allergies.     . Coenzyme Q10 (COQ-10) 100 MG CAPS Take 100 mg by mouth in the morning and at bedtime.    Marland Kitchen ELIQUIS 5 MG TABS tablet TAKE 1 TABLET TWICE A DAY. 180 tablet 2  . gabapentin (NEURONTIN) 100 MG capsule Take 1 capsule (100 mg total) by mouth 2 (two) times daily. 60 capsule 1  . hydrALAZINE (APRESOLINE) 10 MG tablet Take 10 mg by mouth 2 (two) times daily.    . hydrochlorothiazide (HYDRODIURIL) 25 MG tablet Take 1 tablet (25 mg total) by mouth daily. 90 tablet 3  . irbesartan (AVAPRO) 75 MG tablet TAKE ONE TABLET BY MOUTH DAILY 90 tablet 3  . Misc Natural Products (COLON CLEANSE) CAPS Take 4 capsules by mouth daily as needed (constipation).    Marland Kitchen OVER THE COUNTER MEDICATION Take 1 tablet by mouth daily. drenatrophin pmg otc supplement    . Plecanatide (TRULANCE) 3 MG TABS Take 1 tablet by mouth daily. 90 tablet 3  . polyethylene glycol (MIRALAX / GLYCOLAX) 17 g packet Take 17 g by mouth 2 (two) times daily.    Marland Kitchen rOPINIRole (REQUIP) 0.25 MG tablet Take 1 tablet (0.25 mg total) by mouth 2 (two) times daily as needed (RLS). 60 tablet 5  . STIOLTO RESPIMAT 2.5-2.5 MCG/ACT AERS USE 2 INHALATIONS ORALLY   EVERY MORNING 12 g 3   No facility-administered medications prior to visit.     Review of Systems:   Constitutional:   No  weight loss, night sweats,  Fevers, chills,  +fatigue, or  lassitude.  HEENT:   No headaches,  Difficulty swallowing,  Tooth/dental problems, or  Sore throat,                No sneezing, itching, ear ache, nasal congestion, post nasal drip,   CV:  No chest pain,  Orthopnea, PND, swelling in lower extremities, anasarca, dizziness, palpitations, syncope.   GI  No   diarrhea, change in bowel habits, loss of appetite, bloody stools.   Resp:  .  No chest wall  deformity  Skin: no rash or lesions.  GU: no dysuria, change in color of urine, no urgency or frequency.  No flank pain, no hematuria   MS:  No joint pain or swelling.  No decreased range of motion.  No back pain.    Physical Exam  BP 112/68 (BP Location: Left Arm, Cuff Size: Normal)   Pulse 72   Temp (!) 97.5 F (36.4 C)   Ht 5\' 3"  (1.6 m)   Wt 128 lb (58.1 kg)   SpO2 94%   BMI 22.67 kg/m   GEN: A/Ox3; pleasant , NAD, thin elderly   HEENT:  [place]/AT,  NOSE-clear, THROAT-clear, no  lesions, no postnasal drip or exudate noted.   NECK:  Supple w/ fair ROM; no JVD; normal carotid impulses w/o bruits; no thyromegaly or nodules palpated; no lymphadenopathy.    RESP  Clear  P & A; w/o, wheezes/ rales/ or rhonchi. no accessory muscle use, no dullness to percussion  CARD:  RRR, no m/r/g, tr  peripheral edema, pulses intact, no cyanosis or clubbing.  GI:   Soft & nt; nml bowel sounds; no organomegaly or masses detected.   Musco: Warm bil, no deformities or joint swelling noted.   Neuro: alert, no focal deficits noted.    Skin: Warm, no lesions or rashes    Lab Results:   BMET    Component Value Date/Time   NA 135 12/20/2020 1153   NA 134 08/01/2020 1549   K 4.0 12/20/2020 1153   CL 99 12/20/2020 1153   CO2 28 12/20/2020 1153   GLUCOSE 95 12/20/2020 1153   BUN 23 12/20/2020 1153   BUN 21 08/01/2020 1549   CREATININE 0.87 12/20/2020 1153   CALCIUM 9.4 12/20/2020 1153   GFRNONAA 51 (L) 08/01/2020 1549   GFRAA 59 (L) 08/01/2020 1549    BNP No results found for: BNP  ProBNP No results found for: PROBNP  Imaging: DG Chest 2 View  Result Date: 01/05/2021 CLINICAL DATA:  Shortness of breath Emphysema Complex sleep apnea syndrome EXAM: CHEST - 2 VIEW COMPARISON:  10/10/2017 FINDINGS: Heart size within normal limits. Mild pulmonary vascular congestion. Right greater than left lung opacities suspicious for pneumonia. Minimal right pleural effusion also present.  Atherosclerotic changes of the thoracic aorta are seen. IMPRESSION: Bilateral airspace opacities, greater on the right suspicious for pneumonia. Electronically Signed   By: Miachel Roux M.D.   On: 01/05/2021 09:45   CT Abdomen Pelvis W Contrast  Result Date: 01/06/2021 CLINICAL DATA:  Ventral abdominal wall hernia. EXAM: CT ABDOMEN AND PELVIS WITH CONTRAST TECHNIQUE: Multidetector CT imaging of the abdomen and pelvis was performed using the standard protocol following bolus administration of intravenous contrast. CONTRAST:  169mL OMNIPAQUE IOHEXOL 300 MG/ML  SOLN COMPARISON:  None. FINDINGS: Lower chest: Patchy multifocal airspace small opacities at both lung bases. This is likely an inflammatory or infectious process. Underlying emphysematous changes and pulmonary scarring. Recommend correlation with patient's clinical symptoms. Small right pleural effusion. Areas of mild bronchiectasis noted. The heart is within normal limits in size for age. Aortic and coronary artery calcifications are noted. Hepatobiliary: No hepatic lesions or intrahepatic biliary dilatation. The gallbladder is unremarkable. No common bile duct dilatation. Pancreas: No mass, inflammation or ductal dilatation. Moderate pancreatic atrophy. Spleen: Normal size. No focal lesions. Adrenals/Urinary Tract: The adrenal glands and kidneys are unremarkable. Renal cortical scarring changes and small renal cysts are noted. No worrisome renal lesions or hydronephrosis. The bladder is decompressed but grossly normal. Stomach/Bowel: The stomach, duodenum and small bowel are unremarkable. No acute inflammatory changes, mass lesions or obstructive findings. Scattered fluid and moderate stool throughout the colon which is mildly distended. No mass or obstructive process. Part of the transverse colon is in a moderate to large upper right-sided anterior abdominal wall hernia but no findings for incarceration. Vascular/Lymphatic: Advanced atherosclerotic  calcifications involving the aorta and iliac arteries. No dissection. The major venous structures are patent. No mesenteric or retroperitoneal mass or adenopathy. Reproductive: Surgically absent. Other: No pelvic mass or adenopathy. No free pelvic fluid collections. No inguinal mass or adenopathy. Musculoskeletal: No acute bony findings. Osteoporosis and degenerative changes involving the lumbar spine. IMPRESSION:  1. Moderate to large upper right-sided anterior abdominal wall hernia containing a portion of the transverse colon but no findings for incarceration. 2. Patchy multifocal airspace opacities at both lung bases likely an inflammatory or infectious process. Recommend correlation with patient's clinical symptoms. 3. Underlying emphysematous changes and pulmonary scarring. 4. Advanced atherosclerotic calcifications involving the aorta and iliac arteries. Aortic Atherosclerosis (ICD10-I70.0) and Emphysema (ICD10-J43.9). Electronically Signed   By: Marijo Sanes M.D.   On: 01/06/2021 16:52   CT Chest High Resolution  Result Date: 01/16/2021 CLINICAL DATA:  Former smoker. Concern for interstitial lung disease. Abnormal lung bases on recent CT abdomen study. EXAM: CT CHEST WITHOUT CONTRAST TECHNIQUE: Multidetector CT imaging of the chest was performed following the standard protocol without intravenous contrast. High resolution imaging of the lungs, as well as inspiratory and expiratory imaging, was performed. COMPARISON:  01/04/2021 CT abdomen/pelvis.  01/29/2010 chest CT. FINDINGS: Cardiovascular: Normal heart size. No significant pericardial effusion/thickening. Three-vessel coronary atherosclerosis. Atherosclerotic nonaneurysmal thoracic aorta. Normal caliber pulmonary arteries. Mediastinum/Nodes: No discrete thyroid nodules. Unremarkable esophagus. No pathologically enlarged axillary, mediastinal or hilar lymph nodes, noting limited sensitivity for the detection of hilar adenopathy on this noncontrast  study. Lungs/Pleura: No pneumothorax. No pleural effusion. Moderate centrilobular and paraseptal emphysema. There are multiple irregular masslike and nodular foci of consolidation with surrounding patchy ground-glass opacity in the peripheral peribronchovascular lungs bilaterally, predominantly in the lower lungs. There is associated mild-to-moderate cylindrical and varicoid bronchiectasis and mild-to-moderate patchy tree-in-bud opacities in both lungs, predominantly in the mid to lower lungs. These findings are all new since 2011 chest CT study. Findings are similar to minimally improved since recent 01/04/2021 CT abdomen study. For example, a dominant irregular 3.0 x 2.2 cm masslike focus of consolidation in the basilar right lower lobe (series 10/image 123), stable since 01/04/2021. Representative 0.8 cm right lower lobe nodule (series 10/image 115), decreased from 1.2 cm on 01/04/2021 CT. Representative 1.0 cm medial basilar left lower lobe nodule (series 10/image 119), previously 1.1 cm, not appreciably changed. Posterior left lower lobe 1.7 cm indistinct nodule (series 10/image 92), not previously imaged on 01/04/2021 CT abdomen study. No significant lobular air trapping or definite tracheobronchomalacia on the expiration sequence. Upper abdomen: Partially visualized ventral abdominal hernia in the higher right abdominal wall containing a loop of colon, better evaluated on recent CT abdomen study. Musculoskeletal: No aggressive appearing focal osseous lesions. Partially visualized fixation hardware in the proximal right humerus. Mild thoracic spondylosis. IMPRESSION: 1. Multiple irregular masslike and nodular foci of consolidation in the peripheral peribronchovascular lungs bilaterally, predominantly in the mid to lower lungs, with associated mild-to-moderate bronchiectasis and patchy tree-in-bud opacities. Findings are similar to minimally improved since recent 01/04/2021 CT abdomen study. Findings are new  since 2011 chest CT study. Favor recurrent or chronic bronchopneumonia such as due to aspiration or atypical mycobacterial infection (MAI). Neoplasm is difficult to exclude in this high risk patient but is less favored. Close noncontrast chest CT follow-up recommended initially in 3 months. 2. Three-vessel coronary atherosclerosis. 3. Partially visualized ventral abdominal hernia in the higher right abdominal wall containing a loop of colon, better evaluated on recent CT abdomen study. 4. Aortic Atherosclerosis (ICD10-I70.0) and Emphysema (ICD10-J43.9). Electronically Signed   By: Ilona Sorrel M.D.   On: 01/16/2021 11:23      No flowsheet data found.  No results found for: NITRICOXIDE      Assessment & Plan:   COPD (chronic obstructive pulmonary disease) (HCC) Recent exacerbation with possible superimposed pneumonia questionable aspiration.  CT with masslike consolidation, bronchiectasis and tree-in-bud opacities.  Patient with an acute onset in the last 3 weeks questionable etiology.  Patient is on amiodarone.  Does have chronic GI issues and is on Fosamax.  She does have some dysphagia which could predispose her for aspiration.  Also in the differential is possible amiodarone induced pneumonitis.  She did have clinical benefit with steroids. Also in the differential is possible malignancy.  Will need serial follow-up.  She is unable to give a sputum culture or sputum AFB. Recommend CBC with differential and sed rate.  Would consider alternative to amiodarone.  Will check PFTs with [facility] in the next 4 to 6 weeks. We will need a follow-up CT chest in 3 months.  Will have close follow-up in 2 weeks with chest xray .  We will treat with empiric antibiotics and steroids.  Plan  Patient Instructions  Begin Augmentin 875mg  Twice daily  For 7 days  Begin Prednisone 20mg  daily for 7 days and then 10mg  daily for 7 days and 5mg  daily for 7 days and stop .  Hold Fosamax .  Discuss with Cardiology  regarding alternative to Amiodarone .  Continue on Stiolto 2 puffs daily  Aspiration precautions.  Follow up with Dr. Lamonte Sakai  In 2 weeks and As needed   Please contact office for sooner follow up if symptoms do not improve or worsen or seek emergency care        Complex sleep apnea syndrome Continue on nocturnal CPAP.  Follow-up with Dr. Claiborne Billings as planned.  Pneumonia CT chest shows masslike consolidation with bronchiectasis bilaterally right greater than left.  Possible underlying aspiration pneumonia.  Also concerning for possible amiodarone induced pneumonitis. Patient had clinical benefit with steroids.  We will treat with empiric antibiotics and and low-dose steroids.  Have close follow-up in 2 weeks.  Repeat chest x-ray.  But will need a follow-up CT chest most likely in 3 months.   Labs including CBC and sed rate today  Would consider alternative to Fosamax and amiodarone if possible.  Plan  Patient Instructions  Begin Augmentin 875mg  Twice daily  For 7 days  Begin Prednisone 20mg  daily for 7 days and then 10mg  daily for 7 days and 5mg  daily for 7 days and stop .  Hold Fosamax .  Discuss with Cardiology regarding alternative to Amiodarone .  Continue on Stiolto 2 puffs daily  Aspiration precautions.  Follow up with Dr. Lamonte Sakai  In 2 weeks and As needed   Please contact office for sooner follow up if symptoms do not improve or worsen or seek emergency care           Rexene Edison, NP 01/18/2021

## 2021-01-18 NOTE — Assessment & Plan Note (Signed)
Continue on nocturnal CPAP.  Follow-up with Dr. Claiborne Billings as planned.

## 2021-01-18 NOTE — Assessment & Plan Note (Signed)
Recent exacerbation with possible superimposed pneumonia questionable aspiration.   CT with masslike consolidation, bronchiectasis and tree-in-bud opacities.  Patient with an acute onset in the last 3 weeks questionable etiology.  Patient is on amiodarone.  Does have chronic GI issues and is on Fosamax.  She does have some dysphagia which could predispose her for aspiration.  Also in the differential is possible amiodarone induced pneumonitis.  She did have clinical benefit with steroids. Also in the differential is possible malignancy.  Will need serial follow-up.  She is unable to give a sputum culture or sputum AFB. Recommend CBC with differential and sed rate.  Would consider alternative to amiodarone.  Will check PFTs with [facility] in the next 4 to 6 weeks. We will need a follow-up CT chest in 3 months.  Will have close follow-up in 2 weeks with chest xray .  We will treat with empiric antibiotics and steroids.  Plan  Patient Instructions  Begin Augmentin 875mg  Twice daily  For 7 days  Begin Prednisone 20mg  daily for 7 days and then 10mg  daily for 7 days and 5mg  daily for 7 days and stop .  Hold Fosamax .  Discuss with Cardiology regarding alternative to Amiodarone .  Continue on Stiolto 2 puffs daily  Aspiration precautions.  Follow up with Dr. Lamonte Sakai  In 2 weeks and As needed   Please contact office for sooner follow up if symptoms do not improve or worsen or seek emergency care

## 2021-01-18 NOTE — Patient Instructions (Addendum)
Begin Augmentin 875mg  Twice daily  For 7 days  Begin Prednisone 20mg  daily for 7 days and then 10mg  daily for 7 days and 5mg  daily for 7 days and stop .  Hold Fosamax .  Discuss with Cardiology regarding alternative to Amiodarone .  Continue on Stiolto 2 puffs daily  Aspiration precautions.  Follow up with Dr. Lamonte Sakai  In 2 weeks and As needed   Please contact office for sooner follow up if symptoms do not improve or worsen or seek emergency care

## 2021-01-18 NOTE — Telephone Encounter (Signed)
Trulance prior auth done today with [employer] on paper form Faxed back to [phone]   Phone [phone]

## 2021-01-23 ENCOUNTER — Telehealth: Payer: Self-pay | Admitting: Cardiology

## 2021-01-23 NOTE — Telephone Encounter (Signed)
Left message to call back  

## 2021-01-23 NOTE — Telephone Encounter (Signed)
Patient was returning phone call 

## 2021-01-23 NOTE — Telephone Encounter (Signed)
Nicolasa Ducking A      Telephone Encounter  Signed  Creation Time:  01/23/2021 2:14 PM           Signed        Patient was returning phone call

## 2021-01-23 NOTE — Telephone Encounter (Signed)
  Patient needs appointment with APP or AF clinic to discuss. Could consider multaq.   Message text    Thora Lance, RN  [name] 5 days ago   Good Morning,  Dr Holly Benson and his nurse are not in the office today but I will send your message to them for further review.  They will contact you with any recommendations.  Thank You,  Leory Plowman, RN routed conversation to You; Constance Haw, MD 5 days ago   [place], Odis Hollingshead, Ocie Doyne, MD 5 days ago  I have been seeing a pulmonologist [facility]).  Today they recommended that I look for an alternative to amiodorone, as that Could be  contributing to this lung issue.  Please see my chart  for a further explanation.

## 2021-01-24 NOTE — Telephone Encounter (Signed)
Pt scheduled to [name], PA next week

## 2021-01-24 NOTE — Progress Notes (Signed)
Called and spoke with patient, advised of results/recommendations per Rexene Edison NP, she verbalized understanding.  She wanted to let Tammy know that she stopped her Fosamax, she had not been taking it very long, but did not consult with the physician that prescribed it.  She has an appointment with her cardiologist next week to discuss the amiodarone.  I let her know that I would make Tammy aware.  Nothing further needed.

## 2021-01-30 ENCOUNTER — Ambulatory Visit (INDEPENDENT_AMBULATORY_CARE_PROVIDER_SITE_OTHER): Payer: Medicare Other | Admitting: Student

## 2021-01-30 ENCOUNTER — Other Ambulatory Visit: Payer: Self-pay

## 2021-01-30 ENCOUNTER — Other Ambulatory Visit: Payer: Self-pay | Admitting: Internal Medicine

## 2021-01-30 ENCOUNTER — Encounter: Payer: Self-pay | Admitting: Student

## 2021-01-30 VITALS — BP 120/60 | HR 66 | Ht 63.0 in | Wt 130.0 lb

## 2021-01-30 DIAGNOSIS — I10 Essential (primary) hypertension: Secondary | ICD-10-CM | POA: Diagnosis not present

## 2021-01-30 DIAGNOSIS — I480 Paroxysmal atrial fibrillation: Secondary | ICD-10-CM

## 2021-01-30 DIAGNOSIS — R0600 Dyspnea, unspecified: Secondary | ICD-10-CM | POA: Diagnosis not present

## 2021-01-30 DIAGNOSIS — R0602 Shortness of breath: Secondary | ICD-10-CM | POA: Diagnosis not present

## 2021-01-30 DIAGNOSIS — R0609 Other forms of dyspnea: Secondary | ICD-10-CM

## 2021-01-30 NOTE — Progress Notes (Signed)
PCP:  Rohaan Durnil Boston, MD Primary Cardiologist: Pixie Casino, MD Electrophysiologist: Constance Haw, MD   Holly Benson is a 87 y.o. female with h/o atrial flutter/afib, HLD, OSA on CPAP, and COPD seen today for Holly Meredith Leeds, MD for acute visit due to abnormal lung studies..  Since last being seen in our clinic the patient reports doing well. She has had dyspnea on exertion that is currently being treated and evaluated by pulmonary.  She has felt better on steroids. Hasn't felt like she's had AF in over a year. she denies chest pain, palpitations, PND, orthopnea, nausea, vomiting, dizziness, syncope, edema, weight gain, or early satiety.  Past Medical History:  Diagnosis Date  . Arrhythmia   . Arthritis   . Atrial fibrillation ([place])   . Constipation    chronic  . COPD (chronic obstructive pulmonary disease) ([place])   . Hyperlipidemia   . Hypertension    pulmonary  . OSA on CPAP   . Osteoporosis   . RLS (restless legs syndrome)   . Sleep apnea    Past Surgical History:  Procedure Laterality Date  . ABDOMINAL AORTOGRAM W/LOWER EXTREMITY N/A 03/21/2020   Procedure: ABDOMINAL AORTOGRAM W/ Bilateral LOWER EXTREMITY [place];  Surgeon: Wellington Hampshire, MD;  Location: [facility];  Service: Cardiovascular;  Laterality: N/A;  . ABDOMINAL HYSTERECTOMY    . arm surgery Right    Broken arm and has a plate in it  . CATARACT EXTRACTION Bilateral   . COLONOSCOPY     More than 10 years ago [facility]  . KNEE ARTHROSCOPY Right   . PERIPHERAL VASCULAR INTERVENTION Bilateral 03/21/2020   Procedure: PERIPHERAL VASCULAR INTERVENTION;  Surgeon: Wellington Hampshire, MD;  Location: [facility];  Service: Cardiovascular;  Laterality: Bilateral;  external iliac    Current Outpatient Medications  Medication Sig Dispense Refill  . acetaminophen (TYLENOL) 325 MG tablet Take 650 mg by mouth every 6 (six) hours as needed for moderate pain or headache.    . albuterol  (VENTOLIN HFA) 108 (90 Base) MCG/ACT inhaler Inhale 2 puffs into the lungs every 6 (six) hours as needed for wheezing or shortness of breath. 18 g 6  . amiodarone (PACERONE) 100 MG tablet Take 1 tablet (100 mg total) by mouth daily. 90 tablet 3  . Carboxymethylcellulose Sodium (THERATEARS OP) Place 1 drop into both eyes daily as needed (dry eyes).    . cetirizine (ZYRTEC) 10 MG tablet Take 10 mg by mouth daily as needed for allergies.     . Coenzyme Q10 (COQ-10) 100 MG CAPS Take 100 mg by mouth in the morning and at bedtime.    Marland Kitchen ELIQUIS 5 MG TABS tablet TAKE 1 TABLET TWICE A DAY. 180 tablet 2  . gabapentin (NEURONTIN) 100 MG capsule Take 1 capsule (100 mg total) by mouth 2 (two) times daily. 60 capsule 1  . hydrALAZINE (APRESOLINE) 10 MG tablet Take 10 mg by mouth 2 (two) times daily.    . hydrochlorothiazide (HYDRODIURIL) 25 MG tablet Take 1 tablet (25 mg total) by mouth daily. 90 tablet 3  . irbesartan (AVAPRO) 75 MG tablet TAKE ONE TABLET BY MOUTH DAILY 90 tablet 3  . Misc Natural Products (COLON CLEANSE) CAPS Take 4 capsules by mouth daily as needed (constipation).    Marland Kitchen OVER THE COUNTER MEDICATION Take 1 tablet by mouth daily. drenatrophin pmg otc supplement    . Plecanatide (TRULANCE) 3 MG TABS Take 1 tablet by mouth daily.  90 tablet 3  . polyethylene glycol (MIRALAX / GLYCOLAX) 17 g packet Take 17 g by mouth 2 (two) times daily.    . predniSONE (DELTASONE) 10 MG tablet 2 tabs for 7 days,   then 1 tab for 7  days, then 1/2 tab daily for 7 days , then stop 28 tablet 0  . rOPINIRole (REQUIP) 0.25 MG tablet Take 1 tablet (0.25 mg total) by mouth 2 (two) times daily as needed (RLS). 60 tablet 5  . STIOLTO RESPIMAT 2.5-2.5 MCG/ACT AERS USE 2 INHALATIONS ORALLY   EVERY MORNING 12 g 3   No current facility-administered medications for this visit.    Allergies  Allergen Reactions  . Lovenox [Enoxaparin Sodium] Hives and Rash    PT STATES SHE BROKE OUT IN A RASH HEAD TO TOE AND LASTED ABOUT 3  WEEKS   . Metoprolol     Muscle aches    Social History   Socioeconomic History  . Marital status: Widowed    Spouse name: Not on file  . Number of children: 3  . Years of education: Not on file  . Highest education level: Not on file  Occupational History  . Occupation: retired    Comment: retired Licensed conveyancer  Tobacco Use  . Smoking status: Former Smoker    Packs/day: 1.50    Years: 30.00    Pack years: 45.00    Types: Cigarettes    Start date: 23    Quit date: 11/04/1991    Years since quitting: 29.2  . Smokeless tobacco: Never Used  Vaping Use  . Vaping Use: Never used  Substance and Sexual Activity  . Alcohol use: Yes    Alcohol/week: 1.0 standard drink    Types: 1 Glasses of wine per week  . Drug use: No  . Sexual activity: Not Currently  Other Topics Concern  . Not on file  Social History Narrative   Lives with daughter   Social Determinants of Health   Financial Resource Strain: Not on file  Food Insecurity: Not on file  Transportation Needs: Not on file  Physical Activity: Not on file  Stress: Not on file  Social Connections: Not on file  Intimate Partner Violence: Not on file     Review of Systems: General: No chills, fever, night sweats or weight changes  Cardiovascular:  No chest pain, dyspnea on exertion, edema, orthopnea, palpitations, paroxysmal nocturnal dyspnea Dermatological: No rash, lesions or masses Respiratory: No cough, dyspnea Urologic: No hematuria, dysuria Abdominal: No nausea, vomiting, diarrhea, bright red blood per rectum, melena, or hematemesis Neurologic: No visual changes, weakness, changes in mental status All other systems reviewed and are otherwise negative except as noted above.  Physical Exam: Vitals:   01/30/21 1113  BP: 120/60  Pulse: 66  SpO2: 96%  Weight: 130 lb (59 kg)  Height: 5\' 3"  (1.6 m)    GEN- The patient is well appearing, alert and oriented x 3 today.   HEENT: normocephalic, atraumatic; sclera  clear, conjunctiva pink; hearing intact; oropharynx clear; neck supple, no JVP Lymph- no cervical lymphadenopathy Lungs- Clear to ausculation bilaterally, normal work of breathing.  No wheezes, rales, rhonchi Heart- Regular rate and rhythm, no murmurs, rubs or gallops, PMI not laterally displaced GI- soft, non-tender, non-distended, bowel sounds present, no hepatosplenomegaly Extremities- no clubbing, cyanosis, or edema; DP/PT/radial pulses 2+ bilaterally MS- no significant deformity or atrophy Skin- warm and dry, no rash or lesion Psych- euthymic mood, full affect Neuro- strength and sensation are intact  EKG is not ordered.  Additional studies reviewed include: Previous EP and pulmonary notes  Assessment and Plan:  1. Atrial fibrillation/typical atrial flutter Remain on Eliquis on CHA2DS2VASC of at least 4.   Regular on exam  We are stopping amiodarone in the light of DOE and High res CT as below  2. OSA Continue nightly CPAP  3. HTN Well controlled today.   4. DOE/COPD High res chest CT showed  "Multiple irregular masslike and nodular foci of consolidation in the peripheral peribronchovascular lungs bilaterally, predominantly in the mid to lower lungs, with associated mild-to-moderate bronchiectasis and patchy tree-in-bud opacities. Findings are similar to minimally improved since recent 01/04/2021 CT abdomen study. Findings are new since 2011 chest CT study. Favor recurrent or chronic bronchopneumonia such as due to aspiration or atypical mycobacterial infection (MAI). Neoplasm is difficult to exclude in this high risk patient but is less favored. Close noncontrast chest CT follow-up recommended initially in 3 months."   It was recommended she be tried off amiodarone.  Unfortunately she does not have any good alternatives having failed flecainide and low CrCL would require the moderate dose of tikosyn. (lower depending on follow up labs) which would require wash out of  amiodarone anyway.     We Holly stop amiodarone and follow for symptoms of fib/flutter per Dr. Curt Bears. We Holly follow up in ~ 3 month about the time her amio would've washed out.   [name], PA-C  01/30/21 11:23 AM

## 2021-01-30 NOTE — Patient Instructions (Addendum)
Medication Instructions:  Your physician has recommended you make the following change in your medication:   STOP: Amiodarone  *If you need a refill on your cardiac medications before your next appointment, please call your pharmacy*   Lab Work: None today If you have labs (blood work) drawn today and your tests are completely normal, you will receive your results only by: Marland Kitchen MyChart Message (if you have MyChart) OR . A paper copy in the mail If you have any lab test that is abnormal or we need to change your treatment, we will call you to review the results.  Follow-Up: At [facility], you and your health needs are our priority.  As part of our continuing mission to provide you with exceptional heart care, we have created designated Provider Care Teams.  These Care Teams include your primary Cardiologist (physician) and Advanced Practice Providers (APPs -  Physician Assistants and Nurse Practitioners) who all work together to provide you with the care you need, when you need it.  Your next appointment:   As scheduled

## 2021-01-31 ENCOUNTER — Telehealth: Payer: Self-pay | Admitting: *Deleted

## 2021-01-31 ENCOUNTER — Ambulatory Visit (INDEPENDENT_AMBULATORY_CARE_PROVIDER_SITE_OTHER): Payer: Medicare Other | Admitting: Adult Health

## 2021-01-31 ENCOUNTER — Encounter: Payer: Self-pay | Admitting: Adult Health

## 2021-01-31 ENCOUNTER — Ambulatory Visit (INDEPENDENT_AMBULATORY_CARE_PROVIDER_SITE_OTHER): Payer: Medicare Other

## 2021-01-31 VITALS — BP 108/50 | HR 67 | Temp 97.3°F | Ht 63.0 in | Wt 131.0 lb

## 2021-01-31 DIAGNOSIS — G4731 Primary central sleep apnea: Secondary | ICD-10-CM | POA: Diagnosis not present

## 2021-01-31 DIAGNOSIS — J439 Emphysema, unspecified: Secondary | ICD-10-CM | POA: Diagnosis not present

## 2021-01-31 DIAGNOSIS — J181 Lobar pneumonia, unspecified organism: Secondary | ICD-10-CM

## 2021-01-31 DIAGNOSIS — J441 Chronic obstructive pulmonary disease with (acute) exacerbation: Secondary | ICD-10-CM

## 2021-01-31 DIAGNOSIS — I480 Paroxysmal atrial fibrillation: Secondary | ICD-10-CM

## 2021-01-31 NOTE — Progress Notes (Signed)
ATC x1, LVM to return call.

## 2021-01-31 NOTE — Telephone Encounter (Signed)
Called and left a detailed message per DPR, regarding cxr results, need for PFT on day of f/u with Dr. Lamonte Sakai, scheduled for 03/15/21 at 12 pm.  Will need to schedule covid test 3 days prior when she calls back.  Will await return call.

## 2021-01-31 NOTE — Assessment & Plan Note (Signed)
Resolving COPD exacerbation with probable superimposed pneumonia.  CT chest with abnormal masslike consolidation.  Patient is clinically improved after antibiotics.  There is concern of possible aspiration as patient has chronic dysphagia and GI issues.  Agree with continue to hold Fosamax.  Discussed with primary care regarding osteoporosis treatment. Aspiration precautions discussed. We will need close follow-up, chest x-ray today.  And CT chest in 6 to 8 weeks. Also concern for possible amiodarone lung toxicity however ESR was normal.  Patient has been seen by cardiology and amiodarone has been held. PFT on return visit  Plan  Patient Instructions  Finish Prednisone as directed.  Continue on Stiolto 2 puffs daily.  Aspiration swallow precautions.  Activity as tolerated.  Chest xray today .  CT chest without contrast in 6-8 weeks prior to next office visit .  Follow up with Dr. Lamonte Sakai  In 6 -8 weeks and As needed   Please contact office for sooner follow up if symptoms do not improve or worsen or seek emergency care

## 2021-01-31 NOTE — Progress Notes (Signed)
'@Patient'  ID: Holly Benson, female    DOB: [birth year], 87 y.o.   MRN: [id number]  Chief Complaint  Patient presents with  . Follow-up    Referring provider: Michael Boston, MD  HPI: 87 year old female former smoker followed for COPD, chronic cough complicated by GERD and allergic rhinitis Medical history significant for atrial fibrillation on amiodarone, hypertension, OSA on nocturnal CPAP managed by Dr. Claiborne Billings  TEST/EVENTS :  2D echo September 26, 2020 EF 6065%, moderately elevated pulmonary artery pressure, RVSP 48.7 mmHg.,  Right atrium severely dilated, left atrium severely dilated.  Moderate tricuspid valve regurg, mild to moderate aortic valve sclerosis  01/31/2021 Follow up ; COPD , Abnormal CT chest  Patient presents for a 2-week follow-up.  Patient has had a slow to resolve COPD exacerbation over the last month.  She has had increased shortness of breath, exertional desaturations.  She was treated initially with a prednisone burst.  Chest x-ray showed bilateral opacities.  Patient was set up for a CT chest that showed multiple irregular masslike and nodular areas of consolidation with surrounding patchy groundglass opacities.  Patient was felt to have a possible underlying infectious source.  She was started on Augmentin x7 days. And slow prednisone taper.  She was recommended to hold Fosamax.  And was also recommended to discuss with cardiology regarding ongoing amiodarone use. Patient has chronic GI issues.  There was some concern for possible aspiration as she has some dysphagia.  Lab work showed a normal WBC and ESR. Since last visit patient is feeling much better . Decreased cough and dyspnea. Energy level is improving . Improved activity tolerance.  She has been seen by cardiology and amiodarone has been stopped. O2 sats have been good, average at home 96% on room air.  Has finished augmentin . Has few days left on prednisone .  Appetite is good no n/v.d.   Allergies   Allergen Reactions  . Lovenox [Enoxaparin Sodium] Hives and Rash    PT STATES SHE BROKE OUT IN A RASH HEAD TO TOE AND LASTED ABOUT 3 WEEKS   . Metoprolol     Muscle aches    Immunization History  Administered Date(s) Administered  . Influenza, High Dose Seasonal PF 08/03/2017, 07/15/2018, 06/28/2019, 08/03/2020  . Influenza,inj,Quad PF,6+ Mos 08/02/2018  . PFIZER(Purple Top)SARS-COV-2 Vaccination 12/08/2019, 01/02/2020, 10/02/2020  . Zoster Recombinat (Shingrix) 12/25/2017    Past Medical History:  Diagnosis Date  . Arrhythmia   . Arthritis   . Atrial fibrillation ([place])   . Constipation    chronic  . COPD (chronic obstructive pulmonary disease) ([place])   . Hyperlipidemia   . Hypertension    pulmonary  . OSA on CPAP   . Osteoporosis   . RLS (restless legs syndrome)   . Sleep apnea     Tobacco History: Social History   Tobacco Use  Smoking Status Former Smoker  . Packs/day: 1.50  . Years: 30.00  . Pack years: 45.00  . Types: Cigarettes  . Start date: 81  . Quit date: 11/04/1991  . Years since quitting: 29.2  Smokeless Tobacco Never Used   Counseling given: Not Answered   Outpatient Medications Prior to Visit  Medication Sig Dispense Refill  . acetaminophen (TYLENOL) 325 MG tablet Take 650 mg by mouth every 6 (six) hours as needed for moderate pain or headache.    . albuterol (VENTOLIN HFA) 108 (90 Base) MCG/ACT inhaler Inhale 2 puffs into the lungs every 6 (six) hours as needed for wheezing or  shortness of breath. 18 g 6  . Carboxymethylcellulose Sodium (THERATEARS OP) Place 1 drop into both eyes daily as needed (dry eyes).    . cetirizine (ZYRTEC) 10 MG tablet Take 10 mg by mouth daily as needed for allergies.     . Coenzyme Q10 (COQ-10) 100 MG CAPS Take 100 mg by mouth in the morning and at bedtime.    Marland Kitchen ELIQUIS 5 MG TABS tablet TAKE 1 TABLET TWICE A DAY. 180 tablet 2  . gabapentin (NEURONTIN) 100 MG capsule Take 1 capsule (100 mg total) by mouth 2 (two) times  daily. 60 capsule 1  . hydrALAZINE (APRESOLINE) 10 MG tablet Take 10 mg by mouth 2 (two) times daily.    . hydrochlorothiazide (HYDRODIURIL) 25 MG tablet TAKE ONE TABLET BY MOUTH DAILY 90 tablet 3  . irbesartan (AVAPRO) 75 MG tablet TAKE ONE TABLET BY MOUTH DAILY 90 tablet 3  . Misc Natural Products (COLON CLEANSE) CAPS Take 4 capsules by mouth daily as needed (constipation).    Marland Kitchen OVER THE COUNTER MEDICATION Take 1 tablet by mouth daily. drenatrophin pmg otc supplement    . Peppermint Oil (IBGARD PO) Take by mouth daily. OTC 1 hour before dinner    . Plecanatide (TRULANCE) 3 MG TABS Take 1 tablet by mouth daily. 90 tablet 3  . polyethylene glycol (MIRALAX / GLYCOLAX) 17 g packet Take 17 g by mouth 2 (two) times daily.    . predniSONE (DELTASONE) 10 MG tablet 2 tabs for 7 days,   then 1 tab for 7  days, then 1/2 tab daily for 7 days , then stop 28 tablet 0  . rOPINIRole (REQUIP) 0.25 MG tablet Take 1 tablet (0.25 mg total) by mouth 2 (two) times daily as needed (RLS). 60 tablet 5  . STIOLTO RESPIMAT 2.5-2.5 MCG/ACT AERS USE 2 INHALATIONS ORALLY   EVERY MORNING 12 g 3   No facility-administered medications prior to visit.     Review of Systems:   Constitutional:   No  weight loss, night sweats,  Fevers, chills, + fatigue, or  lassitude.  HEENT:   No headaches,  Difficulty swallowing,  Tooth/dental problems, or  Sore throat,                No sneezing, itching, ear ache, nasal congestion, post nasal drip,   CV:  No chest pain,  Orthopnea, PND, swelling in lower extremities, anasarca, dizziness, palpitations, syncope.   GI  No heartburn, indigestion, abdominal pain, nausea, vomiting, diarrhea, change in bowel habits, loss of appetite, bloody stools.   Resp:    No chest wall deformity  Skin: no rash or lesions.  GU: no dysuria, change in color of urine, no urgency or frequency.  No flank pain, no hematuria   MS:  No joint pain or swelling.  No decreased range of motion.  No back  pain.    Physical Exam  BP (!) 108/50 (BP Location: Left Arm, Patient Position: Sitting, Cuff Size: Normal)   Pulse 67   Temp (!) 97.3 F (36.3 C) (Temporal)   Ht '5\' 3"'  (1.6 m)   Wt 131 lb (59.4 kg)   SpO2 96%   BMI 23.21 kg/m   GEN: A/Ox3; pleasant , NAD, elderly    HEENT:  [place]/AT,    NOSE-clear, THROAT-clear, no lesions, no postnasal drip or exudate noted.   NECK:  Supple w/ fair ROM; no JVD; normal carotid impulses w/o bruits; no thyromegaly or nodules palpated; no lymphadenopathy.    RESP  Clear  P & A; w/o, wheezes/ rales/ or rhonchi. no accessory muscle use, no dullness to percussion  CARD:  RRR, no m/r/g, no peripheral edema, pulses intact, no cyanosis or clubbing.  GI:   Soft & nt; nml bowel sounds; no organomegaly or masses detected.   Musco: Warm bil, no deformities or joint swelling noted.   Neuro: alert, no focal deficits noted.    Skin: Warm, no lesions or rashes    Lab Results:  CBC    Component Value Date/Time   WBC 8.5 01/18/2021 1008   RBC 4.15 01/18/2021 1008   HGB 13.4 01/18/2021 1008   HGB 14.0 07/31/2020 1427   HCT 39.9 01/18/2021 1008   HCT 39.7 07/31/2020 1427   PLT 400.0 01/18/2021 1008   PLT 331 07/31/2020 1427   MCV 96.2 01/18/2021 1008   MCV 95 07/31/2020 1427   MCH 33.4 (H) 07/31/2020 1427   MCH 33.6 10/10/2017 1529   MCHC 33.5 01/18/2021 1008   RDW 14.5 01/18/2021 1008   RDW 13.0 07/31/2020 1427   LYMPHSABS 1.5 01/18/2021 1008   LYMPHSABS 0.9 07/31/2020 1427   MONOABS 1.1 (H) 01/18/2021 1008   EOSABS 0.1 01/18/2021 1008   EOSABS 0.0 07/31/2020 1427   BASOSABS 0.1 01/18/2021 1008   BASOSABS 0.0 07/31/2020 1427    BMET    Component Value Date/Time   NA 135 12/20/2020 1153   NA 134 08/01/2020 1549   K 4.0 12/20/2020 1153   CL 99 12/20/2020 1153   CO2 28 12/20/2020 1153   GLUCOSE 95 12/20/2020 1153   BUN 23 12/20/2020 1153   BUN 21 08/01/2020 1549   CREATININE 0.87 12/20/2020 1153   CALCIUM 9.4 12/20/2020 1153    GFRNONAA 51 (L) 08/01/2020 1549   GFRAA 59 (L) 08/01/2020 1549    BNP No results found for: BNP  ProBNP No results found for: PROBNP  Imaging: DG Chest 2 View  Result Date: 01/05/2021 CLINICAL DATA:  Shortness of breath Emphysema Complex sleep apnea syndrome EXAM: CHEST - 2 VIEW COMPARISON:  10/10/2017 FINDINGS: Heart size within normal limits. Mild pulmonary vascular congestion. Right greater than left lung opacities suspicious for pneumonia. Minimal right pleural effusion also present. Atherosclerotic changes of the thoracic aorta are seen. IMPRESSION: Bilateral airspace opacities, greater on the right suspicious for pneumonia. Electronically Signed   By: Miachel Roux M.D.   On: 01/05/2021 09:45   CT Abdomen Pelvis W Contrast  Result Date: 01/06/2021 CLINICAL DATA:  Ventral abdominal wall hernia. EXAM: CT ABDOMEN AND PELVIS WITH CONTRAST TECHNIQUE: Multidetector CT imaging of the abdomen and pelvis was performed using the standard protocol following bolus administration of intravenous contrast. CONTRAST:  11m OMNIPAQUE IOHEXOL 300 MG/ML  SOLN COMPARISON:  None. FINDINGS: Lower chest: Patchy multifocal airspace small opacities at both lung bases. This is likely an inflammatory or infectious process. Underlying emphysematous changes and pulmonary scarring. Recommend correlation with patient's clinical symptoms. Small right pleural effusion. Areas of mild bronchiectasis noted. The heart is within normal limits in size for age. Aortic and coronary artery calcifications are noted. Hepatobiliary: No hepatic lesions or intrahepatic biliary dilatation. The gallbladder is unremarkable. No common bile duct dilatation. Pancreas: No mass, inflammation or ductal dilatation. Moderate pancreatic atrophy. Spleen: Normal size. No focal lesions. Adrenals/Urinary Tract: The adrenal glands and kidneys are unremarkable. Renal cortical scarring changes and small renal cysts are noted. No worrisome renal lesions or  hydronephrosis. The bladder is decompressed but grossly normal. Stomach/Bowel: The stomach, duodenum and small bowel  are unremarkable. No acute inflammatory changes, mass lesions or obstructive findings. Scattered fluid and moderate stool throughout the colon which is mildly distended. No mass or obstructive process. Part of the transverse colon is in a moderate to large upper right-sided anterior abdominal wall hernia but no findings for incarceration. Vascular/Lymphatic: Advanced atherosclerotic calcifications involving the aorta and iliac arteries. No dissection. The major venous structures are patent. No mesenteric or retroperitoneal mass or adenopathy. Reproductive: Surgically absent. Other: No pelvic mass or adenopathy. No free pelvic fluid collections. No inguinal mass or adenopathy. Musculoskeletal: No acute bony findings. Osteoporosis and degenerative changes involving the lumbar spine. IMPRESSION: 1. Moderate to large upper right-sided anterior abdominal wall hernia containing a portion of the transverse colon but no findings for incarceration. 2. Patchy multifocal airspace opacities at both lung bases likely an inflammatory or infectious process. Recommend correlation with patient's clinical symptoms. 3. Underlying emphysematous changes and pulmonary scarring. 4. Advanced atherosclerotic calcifications involving the aorta and iliac arteries. Aortic Atherosclerosis (ICD10-I70.0) and Emphysema (ICD10-J43.9). Electronically Signed   By: Marijo Sanes M.D.   On: 01/06/2021 16:52   CT Chest High Resolution  Result Date: 01/16/2021 CLINICAL DATA:  Former smoker. Concern for interstitial lung disease. Abnormal lung bases on recent CT abdomen study. EXAM: CT CHEST WITHOUT CONTRAST TECHNIQUE: Multidetector CT imaging of the chest was performed following the standard protocol without intravenous contrast. High resolution imaging of the lungs, as well as inspiratory and expiratory imaging, was performed.  COMPARISON:  01/04/2021 CT abdomen/pelvis.  01/29/2010 chest CT. FINDINGS: Cardiovascular: Normal heart size. No significant pericardial effusion/thickening. Three-vessel coronary atherosclerosis. Atherosclerotic nonaneurysmal thoracic aorta. Normal caliber pulmonary arteries. Mediastinum/Nodes: No discrete thyroid nodules. Unremarkable esophagus. No pathologically enlarged axillary, mediastinal or hilar lymph nodes, noting limited sensitivity for the detection of hilar adenopathy on this noncontrast study. Lungs/Pleura: No pneumothorax. No pleural effusion. Moderate centrilobular and paraseptal emphysema. There are multiple irregular masslike and nodular foci of consolidation with surrounding patchy ground-glass opacity in the peripheral peribronchovascular lungs bilaterally, predominantly in the lower lungs. There is associated mild-to-moderate cylindrical and varicoid bronchiectasis and mild-to-moderate patchy tree-in-bud opacities in both lungs, predominantly in the mid to lower lungs. These findings are all new since 2011 chest CT study. Findings are similar to minimally improved since recent 01/04/2021 CT abdomen study. For example, a dominant irregular 3.0 x 2.2 cm masslike focus of consolidation in the basilar right lower lobe (series 10/image 123), stable since 01/04/2021. Representative 0.8 cm right lower lobe nodule (series 10/image 115), decreased from 1.2 cm on 01/04/2021 CT. Representative 1.0 cm medial basilar left lower lobe nodule (series 10/image 119), previously 1.1 cm, not appreciably changed. Posterior left lower lobe 1.7 cm indistinct nodule (series 10/image 92), not previously imaged on 01/04/2021 CT abdomen study. No significant lobular air trapping or definite tracheobronchomalacia on the expiration sequence. Upper abdomen: Partially visualized ventral abdominal hernia in the higher right abdominal wall containing a loop of colon, better evaluated on recent CT abdomen study. Musculoskeletal:  No aggressive appearing focal osseous lesions. Partially visualized fixation hardware in the proximal right humerus. Mild thoracic spondylosis. IMPRESSION: 1. Multiple irregular masslike and nodular foci of consolidation in the peripheral peribronchovascular lungs bilaterally, predominantly in the mid to lower lungs, with associated mild-to-moderate bronchiectasis and patchy tree-in-bud opacities. Findings are similar to minimally improved since recent 01/04/2021 CT abdomen study. Findings are new since 2011 chest CT study. Favor recurrent or chronic bronchopneumonia such as due to aspiration or atypical mycobacterial infection (MAI). Neoplasm is difficult to exclude  in this high risk patient but is less favored. Close noncontrast chest CT follow-up recommended initially in 3 months. 2. Three-vessel coronary atherosclerosis. 3. Partially visualized ventral abdominal hernia in the higher right abdominal wall containing a loop of colon, better evaluated on recent CT abdomen study. 4. Aortic Atherosclerosis (ICD10-I70.0) and Emphysema (ICD10-J43.9). Electronically Signed   By: Ilona Sorrel M.D.   On: 01/16/2021 11:23      No flowsheet data found.  No results found for: NITRICOXIDE      Assessment & Plan:   COPD (chronic obstructive pulmonary disease) ([place]) Resolving COPD exacerbation with probable superimposed pneumonia.  CT chest with abnormal masslike consolidation.  Patient is clinically improved after antibiotics.  There is concern of possible aspiration as patient has chronic dysphagia and GI issues.  Agree with continue to hold Fosamax.  Discussed with primary care regarding osteoporosis treatment. Aspiration precautions discussed. We will need close follow-up, chest x-ray today.  And CT chest in 6 to 8 weeks. Also concern for possible amiodarone lung toxicity however ESR was normal.  Patient has been seen by cardiology and amiodarone has been held. PFT on return visit  Plan  Patient  Instructions  Finish Prednisone as directed.  Continue on Stiolto 2 puffs daily.  Aspiration swallow precautions.  Activity as tolerated.  Chest xray today .  CT chest without contrast in 6-8 weeks prior to next office visit .  Follow up with Dr. Lamonte Sakai  In 6 -8 weeks and As needed   Please contact office for sooner follow up if symptoms do not improve or worsen or seek emergency care        Pneumonia CT chest with multiple irregular masslike nodular areas of consolidation with patchy groundglass opacities suspicious for pneumonia.  Patient was treated with full antibiotic course.  She has clinical improvement.  Will need close follow-up.  Aspiration precautions discussed. Check chest x-ray today.  CT chest in 6 to 8 weeks.  Plan  Patient Instructions  Finish Prednisone as directed.  Continue on Stiolto 2 puffs daily.  Aspiration swallow precautions.  Activity as tolerated.  Chest xray today .  CT chest without contrast in 6-8 weeks prior to next office visit .  Follow up with Dr. Lamonte Sakai  In 6 -8 weeks and As needed   Please contact office for sooner follow up if symptoms do not improve or worsen or seek emergency care        PAF (paroxysmal atrial fibrillation) ([place]) Continue follow-up with cardiology.  Now off of amiodarone. PFTs on return visit.  Complex sleep apnea syndrome Continue on nocturnal CPAP.  Continue follow-up with Dr. Rica Mast, NP 01/31/2021

## 2021-01-31 NOTE — Assessment & Plan Note (Signed)
CT chest with multiple irregular masslike nodular areas of consolidation with patchy groundglass opacities suspicious for pneumonia.  Patient was treated with full antibiotic course.  She has clinical improvement.  Will need close follow-up.  Aspiration precautions discussed. Check chest x-ray today.  CT chest in 6 to 8 weeks.  Plan  Patient Instructions  Finish Prednisone as directed.  Continue on Stiolto 2 puffs daily.  Aspiration swallow precautions.  Activity as tolerated.  Chest xray today .  CT chest without contrast in 6-8 weeks prior to next office visit .  Follow up with Dr. Lamonte Sakai  In 6 -8 weeks and As needed   Please contact office for sooner follow up if symptoms do not improve or worsen or seek emergency care

## 2021-01-31 NOTE — Assessment & Plan Note (Signed)
Continue on nocturnal CPAP.  Continue follow-up with Dr. Claiborne Billings

## 2021-01-31 NOTE — Addendum Note (Signed)
Addended by: Vanessa Barbara on: 01/31/2021 11:00 AM   Modules accepted: Orders

## 2021-01-31 NOTE — Assessment & Plan Note (Signed)
Continue follow-up with cardiology.  Now off of amiodarone. PFTs on return visit.

## 2021-01-31 NOTE — Telephone Encounter (Signed)
Dr Lamonte Sakai,  We received this message from patient on mychart:   In the last month I have had 3 x-rays and 2 CT Scans.  I have another CT scan scheduled for the first part of May.  Is this amount of radiation safe?  Please advise.

## 2021-01-31 NOTE — Patient Instructions (Signed)
Finish Prednisone as directed.  Continue on Stiolto 2 puffs daily.  Aspiration swallow precautions.  Activity as tolerated.  Chest xray today .  CT chest without contrast in 6-8 weeks prior to next office visit .  Follow up with Dr. Lamonte Sakai  In 6 -8 weeks and As needed   Please contact office for sooner follow up if symptoms do not improve or worsen or seek emergency care

## 2021-02-01 ENCOUNTER — Ambulatory Visit (INDEPENDENT_AMBULATORY_CARE_PROVIDER_SITE_OTHER): Payer: Medicare Other | Admitting: Cardiovascular Disease

## 2021-02-01 ENCOUNTER — Other Ambulatory Visit: Payer: Self-pay

## 2021-02-01 ENCOUNTER — Encounter: Payer: Self-pay | Admitting: Cardiovascular Disease

## 2021-02-01 VITALS — BP 114/58 | HR 67 | Ht 63.0 in | Wt 132.4 lb

## 2021-02-01 DIAGNOSIS — Z7901 Long term (current) use of anticoagulants: Secondary | ICD-10-CM

## 2021-02-01 DIAGNOSIS — G4731 Primary central sleep apnea: Secondary | ICD-10-CM

## 2021-02-01 DIAGNOSIS — J438 Other emphysema: Secondary | ICD-10-CM | POA: Diagnosis not present

## 2021-02-01 DIAGNOSIS — I10 Essential (primary) hypertension: Secondary | ICD-10-CM

## 2021-02-01 DIAGNOSIS — G2581 Restless legs syndrome: Secondary | ICD-10-CM | POA: Diagnosis not present

## 2021-02-01 DIAGNOSIS — I480 Paroxysmal atrial fibrillation: Secondary | ICD-10-CM | POA: Diagnosis not present

## 2021-02-01 NOTE — Progress Notes (Signed)
Called and spoke with patient, provided results/recommendations per Rexene Edison NP.  Patient verbalized understanding.  Nothing further needed.

## 2021-02-01 NOTE — Patient Instructions (Signed)
Medication Instructions:  No changes  *If you need a refill on your cardiac medications before your next appointment, please call your pharmacy*   Lab Work: Not needed   Testing/Procedures:  Not needed  Follow-Up: At [facility], you and your health needs are our priority.  As part of our continuing mission to provide you with exceptional heart care, we have created designated Provider Care Teams.  These Care Teams include your primary Cardiologist (physician) and Advanced Practice Providers (APPs -  Physician Assistants and Nurse Practitioners) who all work together to provide you with the care you need, when you need it.     Your next appointment:   12 month(s) sleep clinic  The format for your next appointment:   In Person  Provider:   Shelva Majestic, MD   Other Instructions  pressure changes were done to your C-PAP

## 2021-02-01 NOTE — Telephone Encounter (Signed)
Called and spoke with patient, made aware of need for PFT that is done in our office and is scheduled for 12 pm on 03/15/21 the same day as her f/u with Dr. Lamonte Sakai.  She was agreeable to that time.  Advised of need for covid test 3 days prior, scheduled for covid test on 03/12/21 at 2 pm, address provided.  She verbalized understanding.  Nothing further needed.

## 2021-02-03 ENCOUNTER — Encounter: Payer: Self-pay | Admitting: Cardiovascular Disease

## 2021-02-03 NOTE — Progress Notes (Signed)
Cardiology Office Note    Date:  02/03/2021   ID:  Holly Benson, DOB 08-11-1933, MRN [id number]  PCP:  Michael Boston, MD  Cardiologist:  Shelva Majestic, MD (sleep), Dr. Debara Pickett  6 month F/U sleep evaluation  History of Present Illness:  Cloie Benson is a 87 y.o. female who is followed by Dr. Debara Pickett and has a history of hypertension, atrial fibrillation, status post DC cardioversion, and is on chronic anticoagulation with Eliquis.  She has a history of COPD as well as blood pressure lability and shortness of breath.  Apparently, she was initially referred for a sleep study due to concerns for sleep apnea and underwent a  sleep study initially on December 27, 2018.  This revealed sleep apnea with an overall AHI of 16.8/h.  Sleep apnea was very severe with supine posture with an AHI of 87.3/h.  She had oxygen desaturation to a nadir of 82% and had moderate snoring.  She also had increased periodic leg movements of sleep with an index of 17.8 and associated arousal with leg movement index at 5.8.  She was also found to have mild central sleep apnea with a CAI of 8.9.  She had a follow-up study which showed an AHI of 25.9 she was started on CPAP therapy and her AHI at 11 cm was 20.8.  Due to continued events she was started on BiPAP therapy and central events continued throughout the study such that her AHI at 13/9 was 11.6 and at 14/10 was 52/h.  With therapy she did not have significant periodic limb movements of sleep.  Due to her complex sleep apnea she ultimately underwent ASV titration on July 14, 2019 and initial trial with an EPAP minimum of 5, pressure support range of 4-15, and an auto breath rate was prescribed.  For some reason, the patient never had a sleep visit to see me after her above studies and apparently she had had some issues with calling our sleep coordinator regarding continued poor sleep and only sleeping 3 to 4 hours per night because the ASV machine was blowing hard.  Again for  some reason she was never placed on my schedule.  Recently, she was seen by Dr. Asencion Partridge Dohmeier for restless legs which may be contributing to her sleep disturbance.  It was recommended that she ultimately wean and discontinue her Klonopin gradually and she was started on Neurontin 200 mg at bedtime with a plan be to use Requip at low dose for daytime RLS.  I saw her on August 20, 2020 at which time she complained of issues with sleep maintenance.  She was going t to bed between 9 and 10 PM and wakes up between 5 and 6 AM.  She has nocturia 0-1 time per night.  Oftentimes she may wake up between 2 and 3 AM and have some difficulty falling back to sleep.  Apparently, at that time her iron studies were fairly normal with reference to her restless leg.  Her blood pressure was elevated and slight titration of hydralazine from 10 mg twice a day to 20 mg twice a day was recommended.  She was on amiodarone daily.  She was having significant mask leak which may have been responsible for some of her awakening from sleep and I suggested a trial of a ResMed air fit F 30i mask.  Since I saw her, she has been evaluated by Dr. Curt Bears due to atrial flutter in January 2022 and recently was seen by Barrington Ellison  and her rhythm was stable.  At that time amiodarone was stopped secondary to high resolution chest CT findings.  Presently, from a sleep perspective, she believes she is sleeping well.  A download was obtained from March 2 through January 31, 2021 which shows excellent compliance.  Average usage is 6 hours and 26 minutes per night.  She has a ResMed air curve 10 ASV unit and presently her minimum EPAP is set at 4 with a maximum EPAP of 12 and her pressure support is set at a range of 3-15.  AHI is 5.3.  She has significant mask leak.  Her 95th percentile pressure is 15.8/7.4 with a maximum average pressure of 21.2/8.4.  She denies any residual daytime sleepiness.  An Epworth Sleepiness Scale score was calculated in  the office today and this endorsed at 2.  She presents for follow-up sleep evaluation.  Past Medical History:  Diagnosis Date  . Arrhythmia   . Arthritis   . Atrial fibrillation ([place])   . Constipation    chronic  . COPD (chronic obstructive pulmonary disease) ([place])   . Hyperlipidemia   . Hypertension    pulmonary  . OSA on CPAP   . Osteoporosis   . RLS (restless legs syndrome)   . Sleep apnea     Past Surgical History:  Procedure Laterality Date  . ABDOMINAL AORTOGRAM W/LOWER EXTREMITY N/A 03/21/2020   Procedure: ABDOMINAL AORTOGRAM W/ Bilateral LOWER EXTREMITY [place];  Surgeon: Wellington Hampshire, MD;  Location: [place] [facility];  Service: Cardiovascular;  Laterality: N/A;  . ABDOMINAL HYSTERECTOMY    . arm surgery Right    Broken arm and has a plate in it  . CATARACT EXTRACTION Bilateral   . COLONOSCOPY     More than 10 years ago [facility]  . KNEE ARTHROSCOPY Right   . PERIPHERAL VASCULAR INTERVENTION Bilateral 03/21/2020   Procedure: PERIPHERAL VASCULAR INTERVENTION;  Surgeon: Wellington Hampshire, MD;  Location: [place] [facility];  Service: Cardiovascular;  Laterality: Bilateral;  external iliac    Current Medications: Outpatient Medications Prior to Visit  Medication Sig Dispense Refill  . acetaminophen (TYLENOL) 325 MG tablet Take 650 mg by mouth every 6 (six) hours as needed for moderate pain or headache.    . albuterol (VENTOLIN HFA) 108 (90 Base) MCG/ACT inhaler Inhale 2 puffs into the lungs every 6 (six) hours as needed for wheezing or shortness of breath. 18 g 6  . Carboxymethylcellulose Sodium (THERATEARS OP) Place 1 drop into both eyes daily as needed (dry eyes).    . cetirizine (ZYRTEC) 10 MG tablet Take 10 mg by mouth daily as needed for allergies.     . Coenzyme Q10 (COQ-10) 100 MG CAPS Take 100 mg by mouth in the morning and at bedtime.    Marland Kitchen ELIQUIS 5 MG TABS tablet TAKE 1 TABLET TWICE A DAY. 180 tablet 2  . gabapentin (NEURONTIN) 100 MG capsule Take 1  capsule (100 mg total) by mouth 2 (two) times daily. 60 capsule 1  . hydrALAZINE (APRESOLINE) 10 MG tablet Take 10 mg by mouth 2 (two) times daily.    . hydrochlorothiazide (HYDRODIURIL) 25 MG tablet TAKE ONE TABLET BY MOUTH DAILY 90 tablet 3  . irbesartan (AVAPRO) 75 MG tablet TAKE ONE TABLET BY MOUTH DAILY 90 tablet 3  . Misc Natural Products (COLON CLEANSE) CAPS Take 4 capsules by mouth daily as needed (constipation).    Marland Kitchen OVER THE COUNTER MEDICATION Take 1 tablet by mouth daily.  drenatrophin pmg otc supplement    . Peppermint Oil (IBGARD PO) Take by mouth daily. OTC 1 hour before dinner    . Plecanatide (TRULANCE) 3 MG TABS Take 1 tablet by mouth daily. 90 tablet 3  . polyethylene glycol (MIRALAX / GLYCOLAX) 17 g packet Take 17 g by mouth 2 (two) times daily.    . predniSONE (DELTASONE) 10 MG tablet 2 tabs for 7 days,   then 1 tab for 7  days, then 1/2 tab daily for 7 days , then stop 28 tablet 0  . rOPINIRole (REQUIP) 0.25 MG tablet Take 1 tablet (0.25 mg total) by mouth 2 (two) times daily as needed (RLS). 60 tablet 5  . STIOLTO RESPIMAT 2.5-2.5 MCG/ACT AERS USE 2 INHALATIONS ORALLY   EVERY MORNING 12 g 3   No facility-administered medications prior to visit.     Allergies:   Lovenox [enoxaparin sodium] and Metoprolol   Social History   Socioeconomic History  . Marital status: Widowed    Spouse name: Not on file  . Number of children: 3  . Years of education: Not on file  . Highest education level: Not on file  Occupational History  . Occupation: retired    Comment: retired Licensed conveyancer  Tobacco Use  . Smoking status: Former Smoker    Packs/day: 1.50    Years: 30.00    Pack years: 45.00    Types: Cigarettes    Start date: 58    Quit date: 11/04/1991    Years since quitting: 29.2  . Smokeless tobacco: Never Used  Vaping Use  . Vaping Use: Never used  Substance and Sexual Activity  . Alcohol use: Yes    Alcohol/week: 1.0 standard drink    Types: 1 Glasses of wine per  week  . Drug use: No  . Sexual activity: Not Currently  Other Topics Concern  . Not on file  Social History Narrative   Lives with daughter   Social Determinants of Health   Financial Resource Strain: Not on file  Food Insecurity: Not on file  Transportation Needs: Not on file  Physical Activity: Not on file  Stress: Not on file  Social Connections: Not on file     Family History:  The patient's family history includes Hypertension in her sister; Stroke in her maternal grandfather; Suicidality in her father.   ROS General: Negative; No fevers, chills, or night sweats;  HEENT: Negative; No changes in vision or hearing, sinus congestion, difficulty swallowing Pulmonary: Positive for COPD Cardiovascular: History of atrial fibrillation and flutter GI: Negative; No nausea, vomiting, diarrhea, or abdominal pain GU: Negative; No dysuria, hematuria, or difficulty voiding Musculoskeletal: Arthritis in her proximal interpharyngeal joints but she states rheumatoid factor is negative. Hematologic/Oncology: Negative; no easy bruising, bleeding Endocrine: Negative; no heat/cold intolerance; no diabetes Neuro: Restless legs Skin: Negative; No rashes or skin lesions Psychiatric: Negative; No behavioral problems, depression Sleep: Positive for complex sleep apnea and restless legs.  Positive for snoring; no hypnogognic hallucinations, no cataplexy Other comprehensive 14 point system review is negative.   PHYSICAL EXAM:   VS:  BP (!) 114/58   Pulse 67   Ht '5\' 3"'  (1.6 m)   Wt 132 lb 6.4 oz (60.1 kg)   SpO2 98%   BMI 23.45 kg/m     Repeat blood pressure by me was 120/60.  Wt Readings from Last 3 Encounters:  02/01/21 132 lb 6.4 oz (60.1 kg)  01/31/21 131 lb (59.4 kg)  01/30/21 130 lb (59  kg)    General: Alert, oriented, no distress.  Skin: normal turgor, no rashes, warm and dry HEENT: Normocephalic, atraumatic. Pupils equal round and reactive to light; sclera anicteric; extraocular  muscles intact; Fundi ** Nose without nasal septal hypertrophy Mouth/Parynx benign; Mallinpatti scale Neck: No JVD, no carotid bruits; normal carotid upstroke Lungs: clear to ausculatation and percussion; no wheezing or rales Chest wall: without tenderness to palpitation Heart: PMI not displaced, RRR, s1 s2 normal, 1/6 systolic murmur, no diastolic murmur, no rubs, gallops, thrills, or heaves Abdomen: Right abdominal hernia, nontender; soft, nontender; no hepatosplenomehaly, BS+; abdominal aorta nontender and not dilated by palpation. Back: no CVA tenderness Pulses 2+ Musculoskeletal: Arthritis in her metacarpal/proximal interphalangeal joints. Extremities: no clubbing cyanosis or edema, Homan's sign negative  Neurologic: grossly nonfocal; Cranial nerves grossly wnl Psychologic: Normal mood and affect   Studies/Labs Reviewed:   October 2021 ECG (independently read by me): NSR at 61; LAE, IRBBB, QS V1-2  Recent Labs: BMP Latest Ref Rng & Units 12/20/2020 08/01/2020 04/11/2020  Glucose 70 - 99 mg/dL 95 111(H) 82  BUN 6 - 23 mg/dL 23 21 33(H)  Creatinine 0.40 - 1.20 mg/dL 0.87 1.01(H) 1.15(H)  BUN/Creat Ratio 12 - 28 - 21 29(H)  Sodium 135 - 145 mEq/L 135 134 136  Potassium 3.5 - 5.1 mEq/L 4.0 4.1 5.0  Chloride 96 - 112 mEq/L 99 94(L) 99  CO2 19 - 32 mEq/L '28 26 24  ' Calcium 8.4 - 10.5 mg/dL 9.4 9.5 9.1     Hepatic Function Latest Ref Rng & Units 04/11/2020 08/24/2019  Total Protein 6.0 - 8.5 g/dL 6.2 6.9  Albumin 3.6 - 4.6 g/dL 4.3 4.5  AST 0 - 40 IU/L 24 21  ALT 0 - 32 IU/L 25 18  Alk Phosphatase 48 - 121 IU/L 106 96  Total Bilirubin 0.0 - 1.2 mg/dL 0.5 0.5    CBC Latest Ref Rng & Units 01/18/2021 07/31/2020 04/11/2020  WBC 4.0 - 10.5 K/uL 8.5 7.7 6.1  Hemoglobin 12.0 - 15.0 g/dL 13.4 14.0 13.9  Hematocrit 36.0 - 46.0 % 39.9 39.7 40.1  Platelets 150.0 - 400.0 K/uL 400.0 331 382   Lab Results  Component Value Date   MCV 96.2 01/18/2021   MCV 95 07/31/2020   MCV 96 04/11/2020    Lab Results  Component Value Date   TSH 2.770 08/28/2020   No results found for: HGBA1C   BNP No results found for: BNP  ProBNP No results found for: PROBNP   Lipid Panel     Component Value Date/Time   CHOL 149 04/11/2020 0838   TRIG 60 04/11/2020 0838   HDL 69 04/11/2020 0838   CHOLHDL 2.2 04/11/2020 0838   LDLCALC 68 04/11/2020 0838   LABVLDL 12 04/11/2020 0838     RADIOLOGY: DG Chest 2 View  Result Date: 01/31/2021 CLINICAL DATA:  History of pneumonia. EXAM: CHEST - 2 VIEW COMPARISON:  CT chest 01/15/2021. PA and lateral chest 01/04/2021 and 10/10/2017. FINDINGS: Aeration is markedly improved compared to the prior plain films. Mild, patchy opacities are present in the lung bases and there is some prominence of the pulmonary interstitium. Heart size is upper normal. Extensive aortic atherosclerosis is noted. The lungs are emphysematous. No acute bony abnormality. IMPRESSION: Marked improvement in aeration since the prior plain films with mild streaky opacities in the lung bases which likely represent atelectasis or scar. Aortic Atherosclerosis (ICD10-I70.0) and Emphysema (ICD10-J43.9). Electronically Signed   By: Inge Rise M.D.   On: 01/31/2021  10:50   CT Abdomen Pelvis W Contrast  Result Date: 01/06/2021 CLINICAL DATA:  Ventral abdominal wall hernia. EXAM: CT ABDOMEN AND PELVIS WITH CONTRAST TECHNIQUE: Multidetector CT imaging of the abdomen and pelvis was performed using the standard protocol following bolus administration of intravenous contrast. CONTRAST:  115m OMNIPAQUE IOHEXOL 300 MG/ML  SOLN COMPARISON:  None. FINDINGS: Lower chest: Patchy multifocal airspace small opacities at both lung bases. This is likely an inflammatory or infectious process. Underlying emphysematous changes and pulmonary scarring. Recommend correlation with patient's clinical symptoms. Small right pleural effusion. Areas of mild bronchiectasis noted. The heart is within normal limits in  size for age. Aortic and coronary artery calcifications are noted. Hepatobiliary: No hepatic lesions or intrahepatic biliary dilatation. The gallbladder is unremarkable. No common bile duct dilatation. Pancreas: No mass, inflammation or ductal dilatation. Moderate pancreatic atrophy. Spleen: Normal size. No focal lesions. Adrenals/Urinary Tract: The adrenal glands and kidneys are unremarkable. Renal cortical scarring changes and small renal cysts are noted. No worrisome renal lesions or hydronephrosis. The bladder is decompressed but grossly normal. Stomach/Bowel: The stomach, duodenum and small bowel are unremarkable. No acute inflammatory changes, mass lesions or obstructive findings. Scattered fluid and moderate stool throughout the colon which is mildly distended. No mass or obstructive process. Part of the transverse colon is in a moderate to large upper right-sided anterior abdominal wall hernia but no findings for incarceration. Vascular/Lymphatic: Advanced atherosclerotic calcifications involving the aorta and iliac arteries. No dissection. The major venous structures are patent. No mesenteric or retroperitoneal mass or adenopathy. Reproductive: Surgically absent. Other: No pelvic mass or adenopathy. No free pelvic fluid collections. No inguinal mass or adenopathy. Musculoskeletal: No acute bony findings. Osteoporosis and degenerative changes involving the lumbar spine. IMPRESSION: 1. Moderate to large upper right-sided anterior abdominal wall hernia containing a portion of the transverse colon but no findings for incarceration. 2. Patchy multifocal airspace opacities at both lung bases likely an inflammatory or infectious process. Recommend correlation with patient's clinical symptoms. 3. Underlying emphysematous changes and pulmonary scarring. 4. Advanced atherosclerotic calcifications involving the aorta and iliac arteries. Aortic Atherosclerosis (ICD10-I70.0) and Emphysema (ICD10-J43.9). Electronically  Signed   By: PMarijo SanesM.D.   On: 01/06/2021 16:52   CT Chest High Resolution  Result Date: 01/16/2021 CLINICAL DATA:  Former smoker. Concern for interstitial lung disease. Abnormal lung bases on recent CT abdomen study. EXAM: CT CHEST WITHOUT CONTRAST TECHNIQUE: Multidetector CT imaging of the chest was performed following the standard protocol without intravenous contrast. High resolution imaging of the lungs, as well as inspiratory and expiratory imaging, was performed. COMPARISON:  01/04/2021 CT abdomen/pelvis.  01/29/2010 chest CT. FINDINGS: Cardiovascular: Normal heart size. No significant pericardial effusion/thickening. Three-vessel coronary atherosclerosis. Atherosclerotic nonaneurysmal thoracic aorta. Normal caliber pulmonary arteries. Mediastinum/Nodes: No discrete thyroid nodules. Unremarkable esophagus. No pathologically enlarged axillary, mediastinal or hilar lymph nodes, noting limited sensitivity for the detection of hilar adenopathy on this noncontrast study. Lungs/Pleura: No pneumothorax. No pleural effusion. Moderate centrilobular and paraseptal emphysema. There are multiple irregular masslike and nodular foci of consolidation with surrounding patchy ground-glass opacity in the peripheral peribronchovascular lungs bilaterally, predominantly in the lower lungs. There is associated mild-to-moderate cylindrical and varicoid bronchiectasis and mild-to-moderate patchy tree-in-bud opacities in both lungs, predominantly in the mid to lower lungs. These findings are all new since 2011 chest CT study. Findings are similar to minimally improved since recent 01/04/2021 CT abdomen study. For example, a dominant irregular 3.0 x 2.2 cm masslike focus of consolidation in the basilar right  lower lobe (series 10/image 123), stable since 01/04/2021. Representative 0.8 cm right lower lobe nodule (series 10/image 115), decreased from 1.2 cm on 01/04/2021 CT. Representative 1.0 cm medial basilar left lower  lobe nodule (series 10/image 119), previously 1.1 cm, not appreciably changed. Posterior left lower lobe 1.7 cm indistinct nodule (series 10/image 92), not previously imaged on 01/04/2021 CT abdomen study. No significant lobular air trapping or definite tracheobronchomalacia on the expiration sequence. Upper abdomen: Partially visualized ventral abdominal hernia in the higher right abdominal wall containing a loop of colon, better evaluated on recent CT abdomen study. Musculoskeletal: No aggressive appearing focal osseous lesions. Partially visualized fixation hardware in the proximal right humerus. Mild thoracic spondylosis. IMPRESSION: 1. Multiple irregular masslike and nodular foci of consolidation in the peripheral peribronchovascular lungs bilaterally, predominantly in the mid to lower lungs, with associated mild-to-moderate bronchiectasis and patchy tree-in-bud opacities. Findings are similar to minimally improved since recent 01/04/2021 CT abdomen study. Findings are new since 2011 chest CT study. Favor recurrent or chronic bronchopneumonia such as due to aspiration or atypical mycobacterial infection (MAI). Neoplasm is difficult to exclude in this high risk patient but is less favored. Close noncontrast chest CT follow-up recommended initially in 3 months. 2. Three-vessel coronary atherosclerosis. 3. Partially visualized ventral abdominal hernia in the higher right abdominal wall containing a loop of colon, better evaluated on recent CT abdomen study. 4. Aortic Atherosclerosis (ICD10-I70.0) and Emphysema (ICD10-J43.9). Electronically Signed   By: Ilona Sorrel M.D.   On: 01/16/2021 11:23     Additional studies/ records that were reviewed today include:  The patient's diagnostic polysomnogram, CPAP/BiPAP titration, and ASV titrations were reviewed. Recent records of Dr. Brett Fairy were reviewed  A download was obtained in the office  from September 18 through August 19, 2020 on her ResMed air curve 10  ASV unit.  She is 100% compliant with use with average usage at 6 hours and 29 minutes.  Apparently are a minimum EPAP of 12 with maximum EPAP of 12, minimum pressure support of 3 with maximum pressure support of 3.  95th percentile pressure was 15.3/7.5 with a maximum average pressure of 20.3/8.7.  AHI is 3.8.  There were no central events.  Tidal volume 95th percentile was 875.  95th percentile respiratory rate was 14 with a medium of 11.  Minute ventilation 95th percentile average was 8.4 L/min.  New download was obtained from March to January 31, 2021.  100% compliant.  Average use 6 hours 26 minutes.  AHI 5.3 with a 95 percentile pressure 15.8/7.4 and maximum average pressure 21.2/8.4.  EPAP range 4-12, pressure support range 3-15 cmH2O  ASSESSMENT:    1. Complex sleep apnea syndrome: ASV   2. Essential hypertension   3. PAF (paroxysmal atrial fibrillation) ([place])   4. RLS (restless legs syndrome)   5. Chronic anticoagulation   6. Other emphysema ([place])     PLAN:  Ms. Charniece Venturino is an 87 year old female who has cardiovascular comorbidities including hypertension, history of atrial fibrillation/flutter, COPD/emphysema as well as GERD and chronic constipation.  She has been found to have complex sleep apnea with both obstructive and central events which seems to have benefited from adaptive servo ventilation therapy.  On echocardiography she has documented normal LV function with EF at 60 to 65% without wall motion abnormalities.  She had moderate pulmonary hypertension with an estimated peak PA pressure at 51 mm.  She also has restless legs which may have been contributing to some of her difficulty with sleep  maintenance and some insomnia.  She had been on long-term therapy with Klonopin which was weaned and discontinued and replaced with gabapentin.  Iron studies did not reveal iron deficiency.  Her blood pressure today is stable at 120/60 on a regimen consisting of hydralazine, HCTZ, irbesartan.   Presently she is going to bed at 10 PM and waking up at 6 AM and often wakes up 1 or rarely 2 times per night.  I reviewed her download in detail.  She has to leak which may be contributing to her increased AHI.  I reviewed her current ASV settings and settings I am changing her minimum EPAP setting from 4 to 5 cm and decreasing her maximal EPAP setting to 10 cm.  She will continue pressure support settings as prescribed.  She will be undergoing a follow-up chest CT next month as well as follow-up pulmonary function studies.  She is now off amiodarone.    Medication Adjustments/Labs and Tests Ordered: Current medicines are reviewed at length with the patient today.  Concerns regarding medicines are outlined above.  Medication changes, Labs and Tests ordered today are listed in the Patient Instructions below. Patient Instructions  Medication Instructions:  No changes  *If you need a refill on your cardiac medications before your next appointment, please call your pharmacy*   Lab Work: Not needed   Testing/Procedures:  Not needed  Follow-Up: At [facility], you and your health needs are our priority.  As part of our continuing mission to provide you with exceptional heart care, we have created designated Provider Care Teams.  These Care Teams include your primary Cardiologist (physician) and Advanced Practice Providers (APPs -  Physician Assistants and Nurse Practitioners) who all work together to provide you with the care you need, when you need it.     Your next appointment:   12 month(s) sleep clinic  The format for your next appointment:   In Person  Provider:   Shelva Majestic, MD   Other Instructions  pressure changes were done to your C-PAP    Signed, Shelva Majestic, MD  02/03/2021 12:33 PM    [facility] [address] [place], [place], [place]  [address] Phone: [phone]

## 2021-02-04 NOTE — Telephone Encounter (Signed)
Inbound call from patient stating she received amitiza and not Trulance from pharmacy.  Please advise.

## 2021-02-04 NOTE — Telephone Encounter (Signed)
Called patient and she said Amitiza was sent to . We sent in Trulance on 01/17/2021 not Amitiza   Sameul representative : [facility]   Trulance was submitted on 3/17 not Amitiza. It  looks like the prior auth for Amitiza is still good and that is where the confusion is coming in at.  Processing Trulance prior auth  Over the phone   Trulance approved until 02/04/2022 Called patient to inform

## 2021-03-04 ENCOUNTER — Ambulatory Visit: Payer: Medicare Other | Admitting: Cardiovascular Disease

## 2021-03-06 ENCOUNTER — Ambulatory Visit
Admission: RE | Admit: 2021-03-06 | Discharge: 2021-03-06 | Disposition: A | Discharge status: Home / Self Care | Payer: Medicare Other | Source: Ambulatory Visit | Attending: Adult Health | Admitting: Adult Health

## 2021-03-06 ENCOUNTER — Other Ambulatory Visit: Payer: Self-pay

## 2021-03-06 DIAGNOSIS — I2510 Atherosclerotic heart disease of native coronary artery without angina pectoris: Secondary | ICD-10-CM | POA: Diagnosis not present

## 2021-03-06 DIAGNOSIS — J441 Chronic obstructive pulmonary disease with (acute) exacerbation: Secondary | ICD-10-CM

## 2021-03-06 DIAGNOSIS — J432 Centrilobular emphysema: Secondary | ICD-10-CM | POA: Diagnosis not present

## 2021-03-06 DIAGNOSIS — J9811 Atelectasis: Secondary | ICD-10-CM | POA: Diagnosis not present

## 2021-03-06 DIAGNOSIS — J189 Pneumonia, unspecified organism: Secondary | ICD-10-CM | POA: Diagnosis not present

## 2021-03-06 DIAGNOSIS — J181 Lobar pneumonia, unspecified organism: Secondary | ICD-10-CM

## 2021-03-07 NOTE — Progress Notes (Signed)
Patient returned call, spoke with patient, advised of results/recommendations per Rexene Edison NP.  She verbalized understanding.  Nothing further needed.

## 2021-03-07 NOTE — Progress Notes (Signed)
ATC x1, LMTCB. 

## 2021-03-12 ENCOUNTER — Other Ambulatory Visit (HOSPITAL_COMMUNITY): Payer: Medicare Other

## 2021-03-12 ENCOUNTER — Other Ambulatory Visit (HOSPITAL_COMMUNITY)
Admission: RE | Admit: 2021-03-12 | Discharge: 2021-03-12 | Disposition: A | Discharge status: Home / Self Care | Payer: Medicare Other | Source: Ambulatory Visit | Attending: Emergency Medicine | Admitting: Emergency Medicine

## 2021-03-12 DIAGNOSIS — Z01812 Encounter for preprocedural laboratory examination: Secondary | ICD-10-CM | POA: Insufficient documentation

## 2021-03-12 DIAGNOSIS — Z20822 Contact with and (suspected) exposure to covid-19: Secondary | ICD-10-CM | POA: Insufficient documentation

## 2021-03-12 LAB — SARS CORONAVIRUS 2 (TAT 6-24 HRS): SARS Coronavirus 2: NEGATIVE

## 2021-03-14 DIAGNOSIS — I2729 Other secondary pulmonary hypertension: Secondary | ICD-10-CM | POA: Diagnosis not present

## 2021-03-14 DIAGNOSIS — Z87891 Personal history of nicotine dependence: Secondary | ICD-10-CM | POA: Diagnosis not present

## 2021-03-14 DIAGNOSIS — J449 Chronic obstructive pulmonary disease, unspecified: Secondary | ICD-10-CM | POA: Diagnosis not present

## 2021-03-14 DIAGNOSIS — I429 Cardiomyopathy, unspecified: Secondary | ICD-10-CM | POA: Diagnosis not present

## 2021-03-14 DIAGNOSIS — I1310 Hypertensive heart and chronic kidney disease without heart failure, with stage 1 through stage 4 chronic kidney disease, or unspecified chronic kidney disease: Secondary | ICD-10-CM | POA: Diagnosis not present

## 2021-03-14 DIAGNOSIS — R202 Paresthesia of skin: Secondary | ICD-10-CM | POA: Diagnosis not present

## 2021-03-14 DIAGNOSIS — I480 Paroxysmal atrial fibrillation: Secondary | ICD-10-CM | POA: Diagnosis not present

## 2021-03-14 DIAGNOSIS — G720 Drug-induced myopathy: Secondary | ICD-10-CM | POA: Diagnosis not present

## 2021-03-14 DIAGNOSIS — I739 Peripheral vascular disease, unspecified: Secondary | ICD-10-CM | POA: Diagnosis not present

## 2021-03-14 DIAGNOSIS — E785 Hyperlipidemia, unspecified: Secondary | ICD-10-CM | POA: Diagnosis not present

## 2021-03-14 DIAGNOSIS — D6869 Other thrombophilia: Secondary | ICD-10-CM | POA: Diagnosis not present

## 2021-03-14 DIAGNOSIS — M810 Age-related osteoporosis without current pathological fracture: Secondary | ICD-10-CM | POA: Diagnosis not present

## 2021-03-15 ENCOUNTER — Other Ambulatory Visit: Payer: Self-pay

## 2021-03-15 ENCOUNTER — Ambulatory Visit (INDEPENDENT_AMBULATORY_CARE_PROVIDER_SITE_OTHER): Payer: Medicare Other | Admitting: Emergency Medicine

## 2021-03-15 ENCOUNTER — Encounter: Payer: Self-pay | Admitting: Emergency Medicine

## 2021-03-15 DIAGNOSIS — G4731 Primary central sleep apnea: Secondary | ICD-10-CM

## 2021-03-15 DIAGNOSIS — J441 Chronic obstructive pulmonary disease with (acute) exacerbation: Secondary | ICD-10-CM | POA: Diagnosis not present

## 2021-03-15 DIAGNOSIS — R9389 Abnormal findings on diagnostic imaging of other specified body structures: Secondary | ICD-10-CM

## 2021-03-15 LAB — PULMONARY FUNCTION TEST
DL/VA % pred: 77 %
DL/VA: 3.17 ml/min/mmHg/L
DLCO cor % pred: 54 %
DLCO cor: 9.83 ml/min/mmHg
DLCO unc % pred: 54 %
DLCO unc: 9.83 ml/min/mmHg
FEF 25-75 Post: 0.84 L/sec
FEF 25-75 Pre: 0.63 L/sec
FEF2575-%Change-Post: 32 %
FEF2575-%Pred-Post: 77 %
FEF2575-%Pred-Pre: 57 %
FEV1-%Change-Post: 7 %
FEV1-%Pred-Post: 93 %
FEV1-%Pred-Pre: 87 %
FEV1-Post: 1.56 L
FEV1-Pre: 1.46 L
FEV1FVC-%Change-Post: 3 %
FEV1FVC-%Pred-Pre: 84 %
FEV6-%Change-Post: 5 %
FEV6-%Pred-Post: 112 %
FEV6-%Pred-Pre: 107 %
FEV6-Post: 2.4 L
FEV6-Pre: 2.28 L
FEV6FVC-%Change-Post: 1 %
FEV6FVC-%Pred-Post: 104 %
FEV6FVC-%Pred-Pre: 103 %
FVC-%Change-Post: 3 %
FVC-%Pred-Post: 107 %
FVC-%Pred-Pre: 104 %
FVC-Post: 2.44 L
FVC-Pre: 2.36 L
Post FEV1/FVC ratio: 64 %
Post FEV6/FVC ratio: 98 %
Pre FEV1/FVC ratio: 62 %
Pre FEV6/FVC Ratio: 97 %
RV % pred: 91 %
RV: 2.23 L
TLC % pred: 91 %
TLC: 4.5 L

## 2021-03-15 NOTE — Addendum Note (Signed)
Addended by: Gavin Potters R on: 03/15/2021 04:25 PM   Modules accepted: Orders

## 2021-03-15 NOTE — Assessment & Plan Note (Signed)
Please continue Stiolto 2 puffs once daily. Keep albuterol available to use if needed for shortness of breath, chest tightness, wheezing. Follow with Dr Lamonte Sakai in 6 months or sooner if you have any problems

## 2021-03-15 NOTE — Assessment & Plan Note (Signed)
Continue your positive pressure mask every night while sleeping.  We will obtain the report from [facility] to ensure that we know your current pressure settings.

## 2021-03-15 NOTE — Progress Notes (Signed)
PFT done today. 

## 2021-03-15 NOTE — Patient Instructions (Addendum)
Please continue Stiolto 2 puffs once daily. Keep albuterol available to use if needed for shortness of breath, chest tightness, wheezing. Should be okay to restart her Fosamax.  If this causes difficulty with the swallowing or increases reflux then you may have to stop it again. Agree with trying to stay off amiodarone for now unless heart issues dictate that there is benefit to restarting it. We will plan to repeat your CT scan of the chest in May 2023 Continue your positive pressure mask every night while sleeping.  We will obtain the report from [facility] to ensure that we know your current pressure settings. Follow with Dr Lamonte Sakai in 6 months or sooner if you have any problems

## 2021-03-15 NOTE — Progress Notes (Signed)
Subjective:    Patient ID: Holly Benson, female    DOB: [birth year], 87 y.o.   MRN: [id number]  COPD She complains of cough and shortness of breath. There is no wheezing. Pertinent negatives include no ear pain, fever, headaches, postnasal drip, rhinorrhea, sneezing, sore throat or trouble swallowing. Her past medical history is significant for COPD.   ROV 07/12/20 --87 year old former smoker with COPD, chronic cough in the setting of GERD and allergic rhinitis.  She also has atrial fibrillation and hypertension, obstructive sleep apnea-titration study 07/14/2019 reviewed, showed recommendations for servo-vent 15/5 with PS 4 cmH2O >> she says she sleeps on a CPAP, her DME is [facility]. She describes a lot of irritation in her throat, increased throat clearing. She had a bout of bad reflux recently that set her throat irritation off. She doesn't feel any reflux currently. May have been the inciting even to get cough started.  Currently managed on Stiolto, doesn't have an albuterol right now, was using very rarely. COVID vaccine up to date.  Remains on Zyrtec as needed - not taking currently, does use some homeopathic allergy meds.  Not on any GERD regimen, planning to follow with Dr Silverio Decamp.  Acute OV 01/04/21 --this is an acute office visit for [name].  She is 79 with COPD, chronic cough that are both influenced by GERD and allergic rhinitis.  Also with a history of atrial fibrillation, hypertension, OSA/OHS for which she uses  She has been experiencing increased shortness of breath over the 5 days prior to this visit has noticed some desaturations into the 80s.  Has had some benefit from her albuterol.  She remains on Stiolto.  She has upper airway irritation, is clearing her throat frequently with increased clear mucus. No wheeze, minimal cough. Not really feeling any reflux. Stable baseline ankle edema. She restarted zyrtec 1 week acute. Using albuterol 2x a day.   ROV 03/15/21 --87 year old woman  with history of COPD, GERD with concern for possible intermittent aspiration, allergic rhinitis, OSA/OHS on Servo vent ([facility]).  She was treated for a slowly resolving exacerbation in mid to late March associated with imaging suggestive of pneumonia. CT chest 01/16/2021 with multiple irregular nodular foci of consolidation associated with mild to moderate bronchiectatic change similar but improved from a CT abdomen 2 weeks prior.  She was treated with antibiotics and clinically improved.  Of note her amiodarone was also discontinued. She notes a few heart flutters. No tachycardia to her knowledge. Her breathing is better. She is off fosamax, wonders about restarting  Remains on Stiolto uses albuterol . She is unsure whether she gets benefit from [facility], has been on it for 3 years.  On Zyrtec  CT chest 03/07/2021 reviewed by me, shows continued substantial improvement in her nodular infiltrates bilaterally.  There is still some mild residual bandlike opacity in the right lower lobe, lingula, left lower lobe clearly smaller than prior.  Pulmonary function testing performed today and reviewed by me shows mild obstruction without a bronchodilator response, normal lung volumes, decreased diffusion capacity that does not fully correct when adjusted for alveolar volume.   Review of Systems  Constitutional: Negative for fever and unexpected weight change.  HENT: Negative for congestion, dental problem, ear pain, nosebleeds, postnasal drip, rhinorrhea, sinus pressure, sneezing, sore throat and trouble swallowing.   Eyes: Negative for redness and itching.  Respiratory: Positive for cough and shortness of breath. Negative for chest tightness and wheezing.   Cardiovascular: Negative for palpitations and leg swelling.  Gastrointestinal: Negative for nausea and vomiting.  Genitourinary: Negative for dysuria.  Musculoskeletal: Negative for joint swelling.  Skin: Negative for rash.  Neurological: Negative for  headaches.  Hematological: Does not bruise/bleed easily.  Psychiatric/Behavioral: Negative for dysphoric mood. The patient is not nervous/anxious.        Objective:   Physical Exam Vitals:   03/15/21 1452  BP: 112/68  Pulse: 75  Temp: (!) 97.4 F (36.3 C)  TempSrc: Temporal  SpO2: 97%  Weight: 129 lb 9.6 oz (58.8 kg)  Height: 5\' 3"  (1.6 m)   Gen: Pleasant, thin woman, in no distress,  normal affect, mild kyphosis  ENT: No lesions,  mouth clear,  oropharynx clear, no postnasal drip  Neck: No JVD, no stridor  Lungs: No use of accessory muscles, few bibasilar inspiratory crackles  Cardiovascular: RRR, heart sounds normal, no murmur or gallops, no peripheral edema  Musculoskeletal: joints of hands swollen, nodular  Neuro: alert, non focal  Skin: Warm, no lesions or rashes      Assessment & Plan:  COPD (chronic obstructive pulmonary disease) (HCC) Please continue Stiolto 2 puffs once daily. Keep albuterol available to use if needed for shortness of breath, chest tightness, wheezing. Follow with Dr Lamonte Sakai in 6 months or sooner if you have any problems  Complex sleep apnea syndrome Continue your positive pressure mask every night while sleeping.  We will obtain the report from [facility] to ensure that we know your current pressure settings.   Abnormal CT of the chest Most consistent clinically with multifocal pneumonia based on her improvement, the slow improvement in her CT chest.  That said she did also stop amiodarone at the same time.  There is a nodular focus at the right base that is smaller but still present.  I will follow her with a repeat CT in 1 year.  Agree with trying to stay off amiodarone for now unless heart issues dictate that there is benefit to restarting it. We will plan to repeat your CT scan of the chest in May 2023  Baltazar Apo, MD, PhD 03/15/2021, 3:21 PM [facility] [phone] or if no answer [phone]

## 2021-03-15 NOTE — Assessment & Plan Note (Signed)
Most consistent clinically with multifocal pneumonia based on her improvement, the slow improvement in her CT chest.  That said she did also stop amiodarone at the same time.  There is a nodular focus at the right base that is smaller but still present.  I will follow her with a repeat CT in 1 year.  Agree with trying to stay off amiodarone for now unless heart issues dictate that there is benefit to restarting it. We will plan to repeat your CT scan of the chest in May 2023

## 2021-03-17 ENCOUNTER — Other Ambulatory Visit: Payer: Self-pay | Admitting: Adult Health

## 2021-04-04 ENCOUNTER — Encounter: Payer: Self-pay | Admitting: Internal Medicine

## 2021-04-04 ENCOUNTER — Telehealth (INDEPENDENT_AMBULATORY_CARE_PROVIDER_SITE_OTHER): Payer: Medicare Other | Admitting: Internal Medicine

## 2021-04-04 VITALS — BP 167/85 | HR 73 | Ht 63.0 in | Wt 129.0 lb

## 2021-04-04 DIAGNOSIS — I480 Paroxysmal atrial fibrillation: Secondary | ICD-10-CM

## 2021-04-04 DIAGNOSIS — I10 Essential (primary) hypertension: Secondary | ICD-10-CM | POA: Diagnosis not present

## 2021-04-04 DIAGNOSIS — I2510 Atherosclerotic heart disease of native coronary artery without angina pectoris: Secondary | ICD-10-CM

## 2021-04-04 DIAGNOSIS — G4733 Obstructive sleep apnea (adult) (pediatric): Secondary | ICD-10-CM | POA: Diagnosis not present

## 2021-04-04 DIAGNOSIS — I270 Primary pulmonary hypertension: Secondary | ICD-10-CM

## 2021-04-04 DIAGNOSIS — Z9989 Dependence on other enabling machines and devices: Secondary | ICD-10-CM | POA: Diagnosis not present

## 2021-04-04 DIAGNOSIS — J449 Chronic obstructive pulmonary disease, unspecified: Secondary | ICD-10-CM | POA: Diagnosis not present

## 2021-04-04 DIAGNOSIS — R002 Palpitations: Secondary | ICD-10-CM

## 2021-04-04 DIAGNOSIS — E785 Hyperlipidemia, unspecified: Secondary | ICD-10-CM

## 2021-04-04 MED ORDER — METOPROLOL TARTRATE 25 MG PO TABS: 12.5000 mg | ORAL_TABLET | Freq: Two times a day (BID) | ORAL | 3 refills | Status: DC

## 2021-04-04 NOTE — Progress Notes (Signed)
Virtual Visit via Video Note   This visit type was conducted due to national recommendations for restrictions regarding the COVID-19 Pandemic (e.g. social distancing) in an effort to limit this patient's exposure and mitigate transmission in our community.  Due to her co-morbid illnesses, this patient is at least at moderate risk for complications without adequate follow up.  This format is felt to be most appropriate for this patient at this time.  The patient did have access to video technology.  All issues noted in this document were discussed and addressed.  Limited physical exam could be performed with this format.  Please refer to the patient's chart for her  consent to telehealth for [facility].   Evaluation Performed:  Caregility video follow-up  Date:  04/04/2021   ID:  Holly Benson, DOB Feb 02, 1933, MRN [id number]  Patient Location:  [place] [address]  Provider location:   [address] [place], [place] [address]  PCP:  Michael Boston, MD  Cardiologist:  Pixie Casino, MD Electrophysiologist:  [name], MD   Chief Complaint:  Shortness of breath  History of Present Illness:    Holly Benson is a 87 y.o. female who presents via audio/video conferencing for a telehealth visit today.  Holly Benson was seen today for follow-up.  She continues to have shortness of breath and fatigue.  She was diagnosed with sleep apnea but has not been compliant with her CPAP due to problems with machine.  She says it is "blasting her in the face" when it starts up, suggesting that the ramp feature may be disabled.  I have reached out to our sleep center to assist her with this.  She is also been struggling with recurrent atrial fibrillation.  She saw one of our nurse practitioners who referred her to EP after discussing with me.  She then saw Dr. Curt Bears who recommended flecainide however she had some side effects with that and it was  discontinued.  Most recently she saw [name], PA-C who recommended switching her over to amiodarone.  She just started that yesterday.  She was in sinus rhythm at the time when he saw her.  He noted she had a history of some pulmonary hypertension and thought that maybe her shortness of breath was worsened because of lack of use of her CPAP.  I suspect her pulmonary hypertension etiology is mixed due to COPD/possible pulmonary fibrosis, and history of obstructive sleep apnea.  04/04/2021  Holly Benson was seen for a video follow-up today. She recently was seen by [name], PA-C for EP and some issues were noted with interstitial changes on her CT. Based on this, it was decided to discontinue her amiodarone and wash out for at least 3 months. She has been on no anti-arrythmics since then and recently has been having some palpitations. She has an upcoming appointment with Dr. Curt Bears to discuss options.  The patient does not have symptoms concerning for COVID-19 infection (fever, chills, cough, or new SHORTNESS OF BREATH).    Prior CV studies:   The following studies were reviewed today:  Chart reviewed  PMHx:  Past Medical History:  Diagnosis Date  . Arrhythmia   . Arthritis   . Atrial fibrillation ([place])   . Constipation    chronic  . COPD (chronic obstructive pulmonary disease) ([place])   . Hyperlipidemia   . Hypertension    pulmonary  . OSA on CPAP   . Osteoporosis   .  RLS (restless legs syndrome)   . Sleep apnea     Past Surgical History:  Procedure Laterality Date  . ABDOMINAL AORTOGRAM W/LOWER EXTREMITY N/A 03/21/2020   Procedure: ABDOMINAL AORTOGRAM W/ Bilateral LOWER EXTREMITY [place];  Surgeon: Wellington Hampshire, MD;  Location: [place] [facility];  Service: Cardiovascular;  Laterality: N/A;  . ABDOMINAL HYSTERECTOMY    . arm surgery Right    Broken arm and has a plate in it  . CATARACT EXTRACTION Bilateral   . COLONOSCOPY     More than 10 years ago [facility]   . KNEE ARTHROSCOPY Right   . PERIPHERAL VASCULAR INTERVENTION Bilateral 03/21/2020   Procedure: PERIPHERAL VASCULAR INTERVENTION;  Surgeon: Wellington Hampshire, MD;  Location: [place] [facility];  Service: Cardiovascular;  Laterality: Bilateral;  external iliac    FAMHx:  Family History  Problem Relation Age of Onset  . Suicidality Father   . Stroke Maternal Grandfather   . Hypertension Sister   . Colon cancer Neg Hx   . Esophageal cancer Neg Hx   . Pancreatic cancer Neg Hx   . Stomach cancer Neg Hx   . Liver disease Neg Hx     SOCHx:   reports that she quit smoking about 29 years ago. Her smoking use included cigarettes. She started smoking about 66 years ago. She has a 45.00 pack-year smoking history. She has never used smokeless tobacco. She reports current alcohol use of about 1.0 standard drink of alcohol per week. She reports that she does not use drugs.  ALLERGIES:  Allergies  Allergen Reactions  . Lovenox [Enoxaparin Sodium] Hives and Rash    PT STATES SHE BROKE OUT IN A RASH HEAD TO TOE AND LASTED ABOUT 3 WEEKS   . Metoprolol     Muscle aches    MEDS:  Current Meds  Medication Sig  . acetaminophen (TYLENOL) 325 MG tablet Take 650 mg by mouth every 6 (six) hours as needed for moderate pain or headache.  . albuterol (VENTOLIN HFA) 108 (90 Base) MCG/ACT inhaler Inhale 2 puffs into the lungs every 6 (six) hours as needed for wheezing or shortness of breath.  . Carboxymethylcellulose Sodium (THERATEARS OP) Place 1 drop into both eyes daily as needed (dry eyes).  . cetirizine (ZYRTEC) 10 MG tablet Take 10 mg by mouth daily as needed for allergies.   . Coenzyme Q10 (COQ-10) 100 MG CAPS Take 100 mg by mouth daily.  Marland Kitchen ELIQUIS 5 MG TABS tablet TAKE 1 TABLET TWICE A DAY.  Marland Kitchen gabapentin (NEURONTIN) 100 MG capsule Take 1 capsule by mouth in the morning and 2 capsules in the evening.  . hydrALAZINE (APRESOLINE) 10 MG tablet Take 10 mg by mouth 2 (two) times daily.  .  hydrochlorothiazide (HYDRODIURIL) 25 MG tablet TAKE ONE TABLET BY MOUTH DAILY  . irbesartan (AVAPRO) 75 MG tablet TAKE ONE TABLET BY MOUTH DAILY  . Misc Natural Products (COLON CLEANSE) CAPS Take 4 capsules by mouth daily as needed (constipation).  . Peppermint Oil (IBGARD PO) Take 2 capsules by mouth daily. OTC 1 hour before dinner  . Plecanatide (TRULANCE) 3 MG TABS Take 1 tablet by mouth daily.  . polyethylene glycol (MIRALAX / GLYCOLAX) 17 g packet Take 17 g by mouth daily as needed.  Marland Kitchen rOPINIRole (REQUIP) 0.25 MG tablet Take 1 tablet (0.25 mg total) by mouth 2 (two) times daily as needed (RLS).  . STIOLTO RESPIMAT 2.5-2.5 MCG/ACT AERS USE 2 INHALATIONS ORALLY   EVERY MORNING  . [  DISCONTINUED] gabapentin (NEURONTIN) 100 MG capsule Take 1 capsule (100 mg total) by mouth 2 (two) times daily. (Patient taking differently: 1 capsule by mouth in the morning and 2 capsules at night)     ROS: Pertinent items noted in HPI and remainder of comprehensive ROS otherwise negative.  Labs/Other Tests and Data Reviewed:    Recent Labs: 04/11/2020: ALT 25 08/28/2020: TSH 2.770 12/20/2020: BUN 23; Creatinine, Ser 0.87; Potassium 4.0; Sodium 135 01/18/2021: Hemoglobin 13.4; Platelets 400.0   Recent Lipid Panel Lab Results  Component Value Date/Time   CHOL 149 04/11/2020 08:38 AM   TRIG 60 04/11/2020 08:38 AM   HDL 69 04/11/2020 08:38 AM   CHOLHDL 2.2 04/11/2020 08:38 AM   LDLCALC 68 04/11/2020 08:38 AM    Wt Readings from Last 3 Encounters:  04/04/21 129 lb (58.5 kg)  03/15/21 129 lb 9.6 oz (58.8 kg)  02/01/21 132 lb 6.4 oz (60.1 kg)     Exam:    Vital Signs:  BP (!) 167/85   Pulse 73   Ht 5\' 3"  (1.6 m)   Wt 129 lb (58.5 kg)   BMI 22.85 kg/m    General appearance: alert, appears stated age and no distress Lungs: no audible wheezes Abdomen: normal weight Extremities: extremities normal, atraumatic, no cyanosis or edema Skin: Skin color, texture, turgor normal. No rashes or  lesions Neurologic: Grossly normal Psych: Pleasant  ASSESSMENT & PLAN:    1. History of atrial fibrillation, status post DCCV with recurrence 2. Paroxysmal atrial flutter - off amiodarone d/t lung changes 3. Palpitations 4. CHADSVASC score of 4 - on Eliquis 5. Hypertension  6. Dyslipidemia 7. OSA - on ASV 8. COPD-moderate pulmonary hypertension  [name] has been managed by the EP service and was taken off amiodarone a few months ago - she is again having palpitations. I recommended restarting low dose 12.5 mg BID metoprolol tartrate - she apparently had some intolerance to this in the past, but says it was not bad and was willing to retry the medicine until she sees EP and they can decide on options moving forward.  COVID-19 Education: The signs and symptoms of COVID-19 were discussed with the patient and how to seek care for testing (follow up with PCP or arrange E-visit).  The importance of social distancing was discussed today.  Patient Risk:   After full review of this patients clinical status, I feel that they are at least moderate risk at this time.  Time:   Today, I have spent 25 minutes with the patient with telehealth technology discussing shortness of breath, A. fib, fatigue.     Medication Adjustments/Labs and Tests Ordered: Current medicines are reviewed at length with the patient today.  Concerns regarding medicines are outlined above.   Tests Ordered: No orders of the defined types were placed in this encounter.   Medication Changes: No orders of the defined types were placed in this encounter.   Disposition:  in 6 month(s)  Pixie Casino, MD, [facility], [place] Director of the Advanced Lipid Disorders &  Cardiovascular Risk Reduction Clinic Diplomate of the American Board of Clinical Lipidology Attending Cardiologist  Direct Dial: [phone]  Fax: [phone]  Website:  [email or web address]  Pixie Casino, MD   04/04/2021 9:06 AM

## 2021-04-04 NOTE — Patient Instructions (Signed)
Medication Instructions:  START metoprolol tartrate 12.5mg  twice daily   *If you need a refill on your cardiac medications before your next appointment, please call your pharmacy*   Lab Work: FASTING lipid panel any any [facility]  Dr. Lysbeth Penner office has a lab open M-F from 8-4:30  If you have labs (blood work) drawn today and your tests are completely normal, you will receive your results only by: Marland Kitchen MyChart Message (if you have MyChart) OR . A paper copy in the mail If you have any lab test that is abnormal or we need to change your treatment, we will call you to review the results.   Testing/Procedures: NONE   Follow-Up: At [facility], you and your health needs are our priority.  As part of our continuing mission to provide you with exceptional heart care, we have created designated Provider Care Teams.  These Care Teams include your primary Cardiologist (physician) and Advanced Practice Providers (APPs -  Physician Assistants and Nurse Practitioners) who all work together to provide you with the care you need, when you need it.  We recommend signing up for the patient portal called "MyChart".  Sign up information is provided on this After Visit Summary.  MyChart is used to connect with patients for Virtual Visits (Telemedicine).  Patients are able to view lab/test results, encounter notes, upcoming appointments, etc.  Non-urgent messages can be sent to your provider as well.   To learn more about what you can do with MyChart, go to [email or web address].    Your next appointment:   6 month(s)  The format for your next appointment:   In Person  Provider:   You may see Pixie Casino, MD or one of the following Advanced Practice Providers on your designated Care Team:    [name], PA-C  Fabian Sharp, [place] or   Roby Lofts, [place]  Please call in July or August to arrange your next visit.  Other Instructions

## 2021-04-16 ENCOUNTER — Ambulatory Visit
Admission: RE | Admit: 2021-04-16 | Discharge: 2021-04-16 | Disposition: A | Discharge status: Home / Self Care | Payer: Medicare Other | Source: Ambulatory Visit | Attending: Internal Medicine | Admitting: Internal Medicine

## 2021-04-16 ENCOUNTER — Other Ambulatory Visit: Payer: Self-pay

## 2021-04-16 DIAGNOSIS — Z780 Asymptomatic menopausal state: Secondary | ICD-10-CM | POA: Diagnosis not present

## 2021-04-16 DIAGNOSIS — M810 Age-related osteoporosis without current pathological fracture: Secondary | ICD-10-CM | POA: Diagnosis not present

## 2021-04-16 DIAGNOSIS — M85852 Other specified disorders of bone density and structure, left thigh: Secondary | ICD-10-CM | POA: Diagnosis not present

## 2021-04-20 ENCOUNTER — Encounter: Payer: Self-pay | Admitting: Neurology

## 2021-04-22 ENCOUNTER — Other Ambulatory Visit: Payer: Self-pay | Admitting: Neurology

## 2021-04-23 ENCOUNTER — Other Ambulatory Visit: Payer: Self-pay | Admitting: Family Medicine

## 2021-04-23 MED ORDER — ROPINIROLE HCL 0.25 MG PO TABS: ORAL_TABLET | ORAL | 1 refills | Status: DC

## 2021-04-24 MED ORDER — TRULANCE 3 MG PO TABS: 1.0000 | ORAL_TABLET | Freq: Every day | ORAL | 3 refills | Status: DC

## 2021-04-30 ENCOUNTER — Ambulatory Visit (INDEPENDENT_AMBULATORY_CARE_PROVIDER_SITE_OTHER): Payer: Medicare Other | Admitting: Cardiology

## 2021-04-30 ENCOUNTER — Other Ambulatory Visit: Payer: Self-pay

## 2021-04-30 ENCOUNTER — Encounter: Payer: Self-pay | Admitting: Cardiology

## 2021-04-30 VITALS — BP 130/72 | HR 62 | Ht 63.0 in | Wt 132.0 lb

## 2021-04-30 DIAGNOSIS — I4819 Other persistent atrial fibrillation: Secondary | ICD-10-CM

## 2021-04-30 MED ORDER — METOPROLOL SUCCINATE ER 25 MG PO TB24: 25.0000 mg | ORAL_TABLET | Freq: Every day | ORAL | 6 refills | Status: DC

## 2021-04-30 NOTE — Patient Instructions (Addendum)
Medication Instructions:  Your physician has recommended you make the following change in your medication: STOP Metoprolol Tartrate (Lopressor) START Metoprolol Succinate (Toprol)  *If you need a refill on your cardiac medications before your next appointment, please call your pharmacy*   Lab Work: None ordered   Testing/Procedures: None ordered   Follow-Up: At [facility], you and your health needs are our priority.  As part of our continuing mission to provide you with exceptional heart care, we have created designated Provider Care Teams.  These Care Teams include your primary Cardiologist (physician) and Advanced Practice Providers (APPs -  Physician Assistants and Nurse Practitioners) who all work together to provide you with the care you need, when you need it.   Your next appointment:   6 month(s)  The format for your next appointment:   In Person  Provider:   Legrand Como "Jonni Sanger" Chalmers Cater, PA-C    Thank you for choosing [facility]!!   Trinidad Curet, RN [phone]   Other Instructions  Metoprolol Extended-Release Tablets What is this medication? METOPROLOL (me TOE proe lole) treats high blood pressure and heart failure. It may also be used to prevent chest pain (angina). It works by lowering your blood pressure and heart rate, making it easier for your heart to pump blood to the rest of your body. It belongs to a group of medications called betablockers. This medicine may be used for other purposes; ask your health care provider orpharmacist if you have questions. COMMON BRAND NAME(S): toprol, Toprol XL What should I tell my care team before I take this medication? They need to know if you have any of these conditions: Diabetes Heart or vessel disease like slow heart rate, worsening heart failure, heart block, sick sinus syndrome, or Raynaud's disease Kidney disease Liver disease Lung or breathing disease, like asthma or  emphysema Pheochromocytoma Thyroid disease An unusual or allergic reaction to metoprolol, other beta blockers, medications, foods, dyes, or preservatives Pregnant or trying to get pregnant Breast-feeding How should I use this medication? Take this medication by mouth. Take it as directed on the prescription label at the same time every day. Take it with food. You may cut the tablet in half if it is scored (has a line in the middle of it). This may help you swallow the tablet if the whole tablet is too big. Be sure to take both halves. Do not take just one-half of the tablet. Keep taking it unless your care team tells you tostop. Talk to your care team about the use of this medication in children. While it may be prescribed for children as young as 6 years for selected conditions,precautions do apply. Overdosage: If you think you have taken too much of this medicine contact apoison control center or emergency room at once. NOTE: This medicine is only for you. Do not share this medicine with others. What if I miss a dose? If you miss a dose, take it as soon as you can. If it is almost time for yournext dose, take only that dose. Do not take double or extra doses. What may interact with this medication? This medication may interact with the following: Certain medications for blood pressure, heart disease, irregular heartbeat Certain medications for depression, like monoamine oxidase (MAO) inhibitors, fluoxetine, or paroxetine Clonidine Dobutamine Epinephrine Isoproterenol Reserpine This list may not describe all possible interactions. Give your health care provider a list of all the medicines, herbs, non-prescription drugs, or dietary supplements you use. Also tell them if you  smoke, drink alcohol, or use illegaldrugs. Some items may interact with your medicine. What should I watch for while using this medication? Visit your care team for regular checks on your progress. Check your blood  pressure as directed. Ask your care team what your blood pressure should be.Also, find out when you should contact them. Do not treat yourself for coughs, colds, or pain while you are using this medication without asking your care team for advice. Some medications mayincrease your blood pressure. You may get drowsy or dizzy. Do not drive, use machinery, or do anything that needs mental alertness until you know how this medication affects you. Do not stand up or sit up quickly, especially if you are an older patient. This reduces the risk of dizzy or fainting spells. Alcohol may interfere with theeffect of this medication. Avoid alcoholic drinks. This medication may increase blood sugar. Ask your care team if changes in dietor medications are needed if you have diabetes. What side effects may I notice from receiving this medication? Side effects that you should report to your care team as soon as possible: Allergic reactions-skin rash, itching, hives, swelling of the face, lips, tongue, or throat Heart failure-shortness of breath, swelling of the ankles, feet, or hands, sudden weight gain, unusual weakness or fatigue Low blood pressure-dizziness, feeling faint or lightheaded, blurry vision Raynaud's-cool, numb, or painful fingers or toes that may change color from pale, to blue, to red Slow heartbeat-dizziness, feeling faint or lightheaded, confusion, trouble breathing, unusual weakness or fatigue Worsening mood, feelings of depression Side effects that usually do not require medical attention (report to your careteam if they continue or are bothersome): Change in sex drive or performance Diarrhea Dizziness Fatigue Headache This list may not describe all possible side effects. Call your doctor for medical advice about side effects. You may report side effects to FDA at1-800-FDA-1088. Where should I keep my medication? Keep out of the reach of children and pets. Store at room temperature between  20 and 25 degrees C (68 and 77 degrees F).Throw away any unused medication after the expiration date. NOTE: This sheet is a summary. It may not cover all possible information. If you have questions about this medicine, talk to your doctor, pharmacist, orhealth care provider.  2022 Elsevier/Gold Standard (2020-11-22 12:55:47)

## 2021-04-30 NOTE — Progress Notes (Signed)
Electrophysiology Office Note   Date:  04/30/2021   ID:  Holly Benson, DOB [birth year], MRN [id number]  PCP:  Holly Boston, MD  Cardiologist:  Debara Pickett Primary Electrophysiologist:  Holly Stevick Meredith Leeds, MD    Chief Complaint: AF/flutter   History of Present Illness: Holly Benson is a 87 y.o. female who is being seen today for the evaluation of atrial flutter at the request of Holly Benson, Holly Sans, MD. Presenting today for electrophysiology evaluation.  She has a history of hypertension, atrial fibrillation/flutter, hyperlipidemia, OSA on CPAP, and COPD.  Unfortunately, she was found to have changes on her CT scan and amiodarone stopped.  Today, denies symptoms of palpitations, chest pain, shortness of breath, orthopnea, PND, lower extremity edema, claudication, dizziness, presyncope, syncope, bleeding, or neurologic sequela. The patient is tolerating medications without difficulties.  Since being seen she has done well.  She has no shortness of breath or chest pain.  She has had only minimal palpitations.  She was started on metoprolol by her primary cardiologist due to palpitations.  She has had some fatigue on her metoprolol.    Past Medical History:  Diagnosis Date   Arrhythmia    Arthritis    Atrial fibrillation (HCC)    Constipation    chronic   COPD (chronic obstructive pulmonary disease) (HCC)    Hyperlipidemia    Hypertension    pulmonary   OSA on CPAP    Osteoporosis    RLS (restless legs syndrome)    Sleep apnea    Past Surgical History:  Procedure Laterality Date   ABDOMINAL AORTOGRAM W/LOWER EXTREMITY N/A 03/21/2020   Procedure: ABDOMINAL AORTOGRAM W/ Bilateral LOWER EXTREMITY [place];  Surgeon: Wellington Hampshire, MD;  Location: [place] [facility];  Service: Cardiovascular;  Laterality: N/A;   ABDOMINAL HYSTERECTOMY     arm surgery Right    Broken arm and has a plate in it   CATARACT EXTRACTION Bilateral    COLONOSCOPY     More than 10 years ago In [place] ARTHROSCOPY Right    PERIPHERAL VASCULAR INTERVENTION Bilateral 03/21/2020   Procedure: PERIPHERAL VASCULAR INTERVENTION;  Surgeon: Wellington Hampshire, MD;  Location: [place] [facility];  Service: Cardiovascular;  Laterality: Bilateral;  external iliac     Current Outpatient Medications  Medication Sig Dispense Refill   acetaminophen (TYLENOL) 325 MG tablet Take 650 mg by mouth every 6 (six) hours as needed for moderate pain or headache.     albuterol (VENTOLIN HFA) 108 (90 Base) MCG/ACT inhaler Inhale 2 puffs into the lungs every 6 (six) hours as needed for wheezing or shortness of breath. 18 g 6   Carboxymethylcellulose Sodium (THERATEARS OP) Place 1 drop into both eyes daily as needed (dry eyes).     cetirizine (ZYRTEC) 10 MG tablet Take 10 mg by mouth daily as needed for allergies.      Coenzyme Q10 (COQ-10) 100 MG CAPS Take 100 mg by mouth daily.     ELIQUIS 5 MG TABS tablet TAKE 1 TABLET TWICE A DAY. 180 tablet 2   gabapentin (NEURONTIN) 100 MG capsule Take 1 capsule by mouth in the morning and 2 capsules in the evening.     hydrALAZINE (APRESOLINE) 10 MG tablet Take 10 mg by mouth 2 (two) times daily.     hydrochlorothiazide (HYDRODIURIL) 25 MG tablet TAKE ONE TABLET BY MOUTH DAILY 90 tablet 3   irbesartan (AVAPRO) 75 MG tablet TAKE ONE TABLET BY MOUTH DAILY 90 tablet  3   metoprolol tartrate (LOPRESSOR) 25 MG tablet Take 0.5 tablets (12.5 mg total) by mouth 2 (two) times daily. 90 tablet 3   Misc Natural Products (COLON CLEANSE) CAPS Take 4 capsules by mouth daily as needed (constipation).     Peppermint Oil (IBGARD PO) Take 2 capsules by mouth daily. OTC 1 hour before dinner     Plecanatide (TRULANCE) 3 MG TABS Take 1 tablet by mouth daily. 90 tablet 3   polyethylene glycol (MIRALAX / GLYCOLAX) 17 g packet Take 17 g by mouth daily as needed.     rOPINIRole (REQUIP) 0.25 MG tablet Take 1 tablet in the morning and 2 tablets at bedtime. 270 tablet 1   STIOLTO RESPIMAT 2.5-2.5  MCG/ACT AERS USE 2 INHALATIONS ORALLY   EVERY MORNING 12 g 3   No current facility-administered medications for this visit.    Allergies:   Lovenox [enoxaparin sodium]   Social History:  The patient  reports that she quit smoking about 29 years ago. Her smoking use included cigarettes. She started smoking about 66 years ago. She has a 45.00 pack-year smoking history. She has never used smokeless tobacco. She reports current alcohol use of about 1.0 standard drink of alcohol per week. She reports that she does not use drugs.   Family History:  The patient's family history includes Hypertension in her sister; Stroke in her maternal grandfather; Suicidality in her father.   ROS:  Please see the history of present illness.   Otherwise, review of systems is positive for none.   All other systems are reviewed and negative.   PHYSICAL EXAM: VS:  BP 130/72   Pulse 62   Ht 5\' 3"  (1.6 m)   Wt 132 lb (59.9 kg)   SpO2 96%   BMI 23.38 kg/m  , BMI Body mass index is 23.38 kg/m. GEN: Well nourished, well developed, in no acute distress  HEENT: normal  Neck: no JVD, carotid bruits, or masses Cardiac: RRR; no murmurs, rubs, or gallops,no edema  Respiratory:  clear to auscultation bilaterally, normal work of breathing GI: soft, nontender, nondistended, + BS MS: no deformity or atrophy  Skin: warm and dry Neuro:  Strength and sensation are intact Psych: euthymic mood, full affect  EKG:  EKG is ordered today. Personal review of the ekg ordered shows sinus rhythm  Recent Labs: 08/28/2020: TSH 2.770 12/20/2020: BUN 23; Creatinine, Ser 0.87; Potassium 4.0; Sodium 135 01/18/2021: Hemoglobin 13.4; Platelets 400.0    Lipid Panel     Component Value Date/Time   CHOL 149 04/11/2020 0838   TRIG 60 04/11/2020 0838   HDL 69 04/11/2020 0838   CHOLHDL 2.2 04/11/2020 0838   LDLCALC 68 04/11/2020 0838     Wt Readings from Last 3 Encounters:  04/30/21 132 lb (59.9 kg)  04/04/21 129 lb (58.5 kg)   03/15/21 129 lb 9.6 oz (58.8 kg)      Other studies Reviewed: Additional studies/ records that were reviewed today include: TTE 11/18/18  Review of the above records today demonstrates:  - Left ventricle: The cavity size was normal. Wall thickness was   increased in a pattern of mild LVH. Systolic function was normal.   The estimated ejection fraction was in the range of 60% to 65%.   Wall motion was normal; there were no regional wall motion   abnormalities. The study is not technically sufficient to allow   evaluation of LV diastolic function. - Mitral valve: Mildly thickened leaflets . There was trivial  regurgitation. - Left atrium: Moderately dilated. - Right ventricle: The cavity size was mildly dilated. - Right atrium: Moderately dilated. - Tricuspid valve: There was moderate regurgitation. - Pulmonary arteries: PA peak pressure: 51 mm Hg (S). - Inferior vena cava: The vessel was dilated. The respirophasic   diameter changes were blunted (< 50%), consistent with elevated   central venous pressure.  Cardiac monitor 01/22/2020 personally reviewed Max 96 bpm 09:03am, 03/18 Min 48 bpm 04:29am, 03/20 Avg 66 bpm Less than 1% PACs and PVCs No atrial fibrillation noted Predominant rhythm was sinus rhythm Symptoms associated with sinus rhythm  ASSESSMENT AND PLAN:  1.  Atrial fibrillation/flutter: Currently on Eliquis, metoprolol.  CHA2DS2-VASc of 4.  She had abnormal CT and thus amiodarone was stopped.  She is feeling well with only minimal palpitations.  She is having some fatigue.  We Augusta Mirkin stop metoprolol and start Toprol-XL 25 mg to take daily.  As she is currently feeling well we Emilea Goga hold off on starting an antiarrhythmic.  If she goes back into atrial fibrillation, antiarrhythmics would be indicated.   2.  Obstructive sleep apnea: CPAP compliance encouraged  3.  Hypertension: Currently well controlled  The patient does not have concerns regarding her medicines.  The  following changes were made today: Stop metoprolol, start Toprol-XL  Labs/ tests ordered today include:  Orders Placed This Encounter  Procedures   EKG 12-Lead      Disposition:   FU with Juleen Sorrels 6 months  Signed, Abdirahim Flavell Meredith Leeds, MD  04/30/2021 9:48 AM     [address] [place] [address] [phone] (office) [phone] (fax)

## 2021-05-07 ENCOUNTER — Other Ambulatory Visit: Payer: Self-pay | Admitting: Pharmacist

## 2021-05-08 ENCOUNTER — Other Ambulatory Visit: Payer: Self-pay | Admitting: Pharmacist

## 2021-05-08 MED ORDER — APIXABAN 2.5 MG PO TABS: 2.5000 mg | ORAL_TABLET | Freq: Two times a day (BID) | ORAL | 1 refills | Status: DC

## 2021-05-15 NOTE — Telephone Encounter (Signed)
Pt reports that her AFib is increasing in frequency. It is not bad, "feels like a fluttering and not like when she experienced her worst AFib".  Mostly concerned about the increased frequency. Pt made aware Dr. Curt Bears recommends establishing in our AFib clinic and discuss treatment plan. Pt agreeable and aware their office will call to arrange.  She reports that she will be out of town next week, in town last week of July and then out of town again for a month. If AFib clinic unable to get pt seen in their office d/t her limited schedule, will ask they send back to me to see if I can make accommodations in our office.

## 2021-05-16 NOTE — Progress Notes (Signed)
Primary Care Physician: Michael Boston, MD Primary Cardiologist: Dr Debara Pickett Primary Electrophysiologist: Dr Curt Bears Referring Physician: Dr Odis Hollingshead Holly Benson is a 87 y.o. female with a history of HTN, atrial flutter, HLD, OSA, COPD, PAD, and atrial fibrillation who presents for follow up in the [facility]. She was previously on amiodarone but this was discontinued due to concern about possible lung toxicity. She states that over the last few weeks she has had frequent "fluttering" sensations in her chest which last 10-15 minutes but can happen multiple times per day. She states that this feels "totally different" from her afib in the past. She denies any other symptoms. Patient is on Eliquis for a CHADS2VASC score of 5.  Today, she denies symptoms of chest pain, shortness of breath, orthopnea, PND, lower extremity edema, dizziness, presyncope, syncope, snoring, daytime somnolence, bleeding, or neurologic sequela. The patient is tolerating medications without difficulties and is otherwise without complaint today.    Atrial Fibrillation Risk Factors:  she does have symptoms or diagnosis of sleep apnea. she is compliant with CPAP therapy. she does not have a history of rheumatic fever.   she has a BMI of Body mass index is 23.35 kg/m.Marland Kitchen Filed Weights   05/17/21 0858  Weight: 59.8 kg    Family History  Problem Relation Age of Onset   Suicidality Father    Stroke Maternal Grandfather    Hypertension Sister    Colon cancer Neg Hx    Esophageal cancer Neg Hx    Pancreatic cancer Neg Hx    Stomach cancer Neg Hx    Liver disease Neg Hx      Atrial Fibrillation Management history:  Previous antiarrhythmic drugs: flecainide, amiodarone  Previous cardioversions: none Previous ablations: none CHADS2VASC score: 5 Anticoagulation history: Eliquis   Past Medical History:  Diagnosis Date   Arrhythmia    Arthritis    Atrial fibrillation (HCC)     Constipation    chronic   COPD (chronic obstructive pulmonary disease) (HCC)    Hyperlipidemia    Hypertension    pulmonary   OSA on CPAP    Osteoporosis    RLS (restless legs syndrome)    Sleep apnea    Past Surgical History:  Procedure Laterality Date   ABDOMINAL AORTOGRAM W/LOWER EXTREMITY N/A 03/21/2020   Procedure: ABDOMINAL AORTOGRAM W/ Bilateral LOWER EXTREMITY Runoff;  Surgeon: Wellington Hampshire, MD;  Location: [facility];  Service: Cardiovascular;  Laterality: N/A;   ABDOMINAL HYSTERECTOMY     arm surgery Right    Broken arm and has a plate in it   CATARACT EXTRACTION Bilateral    COLONOSCOPY     More than 10 years ago In [place] ARTHROSCOPY Right    PERIPHERAL VASCULAR INTERVENTION Bilateral 03/21/2020   Procedure: PERIPHERAL VASCULAR INTERVENTION;  Surgeon: Wellington Hampshire, MD;  Location: [facility];  Service: Cardiovascular;  Laterality: Bilateral;  external iliac    Current Outpatient Medications  Medication Sig Dispense Refill   acetaminophen (TYLENOL) 325 MG tablet Take 650 mg by mouth every 6 (six) hours as needed for moderate pain or headache.     albuterol (VENTOLIN HFA) 108 (90 Base) MCG/ACT inhaler Inhale 2 puffs into the lungs every 6 (six) hours as needed for wheezing or shortness of breath. 18 g 6   apixaban (ELIQUIS) 2.5 MG TABS tablet Take 1 tablet (2.5 mg total) by mouth 2 (two) times daily. 180 tablet  1   Carboxymethylcellulose Sodium (THERATEARS OP) Place 1 drop into both eyes daily as needed (dry eyes).     cetirizine (ZYRTEC) 10 MG tablet Take 10 mg by mouth daily as needed for allergies.      Coenzyme Q10 (COQ-10) 100 MG CAPS Take 100 mg by mouth daily.     gabapentin (NEURONTIN) 100 MG capsule Take 1 capsule by mouth in the morning and 2 capsules in the evening.     hydrALAZINE (APRESOLINE) 10 MG tablet Take 10 mg by mouth 2 (two) times daily.     hydrochlorothiazide (HYDRODIURIL) 25 MG tablet TAKE ONE TABLET BY MOUTH  DAILY 90 tablet 3   irbesartan (AVAPRO) 75 MG tablet TAKE ONE TABLET BY MOUTH DAILY 90 tablet 3   metoprolol succinate (TOPROL XL) 25 MG 24 hr tablet Take 1 tablet (25 mg total) by mouth daily. In the evening 30 tablet 6   Misc Natural Products (COLON CLEANSE) CAPS Take 4 capsules by mouth daily as needed (constipation).     Multiple Vitamins-Minerals (PRESERVISION AREDS 2 PO) Take by mouth in the morning and at bedtime.     Peppermint Oil (IBGARD PO) Take 2 capsules by mouth daily. OTC 1 hour before dinner     Plecanatide (TRULANCE) 3 MG TABS Take 1 tablet by mouth daily. 90 tablet 3   polyethylene glycol (MIRALAX / GLYCOLAX) 17 g packet Take 17 g by mouth daily as needed.     rOPINIRole (REQUIP) 0.25 MG tablet Take 1 tablet in the morning and 2 tablets at bedtime. 270 tablet 1   STIOLTO RESPIMAT 2.5-2.5 MCG/ACT AERS USE 2 INHALATIONS ORALLY   EVERY MORNING 12 g 3   zinc gluconate 50 MG tablet Take 50 mg by mouth daily.     No current facility-administered medications for this encounter.    Allergies  Allergen Reactions   Lovenox [Enoxaparin Sodium] Hives and Rash    PT STATES SHE BROKE OUT IN A RASH HEAD TO TOE AND LASTED ABOUT 3 WEEKS     Social History   Socioeconomic History   Marital status: Widowed    Spouse name: Not on file   Number of children: 3   Years of education: Not on file   Highest education level: Not on file  Occupational History   Occupation: retired    Comment: retired Licensed conveyancer  Tobacco Use   Smoking status: Former    Packs/day: 1.50    Years: 30.00    Pack years: 45.00    Types: Cigarettes    Start date: 1956    Quit date: 11/04/1991    Years since quitting: 29.5   Smokeless tobacco: Never  Vaping Use   Vaping Use: Never used  Substance and Sexual Activity   Alcohol use: Yes    Alcohol/week: 1.0 standard drink    Types: 1 Glasses of wine per week    Comment: nightly   Drug use: No   Sexual activity: Not Currently  Other Topics Concern   Not  on file  Social History Narrative   Lives with daughter   Social Determinants of Health   Financial Resource Strain: Not on file  Food Insecurity: Not on file  Transportation Needs: Not on file  Physical Activity: Not on file  Stress: Not on file  Social Connections: Not on file  Intimate Partner Violence: Not on file     ROS- All systems are reviewed and negative except as per the HPI above.  Physical Exam: Vitals:  05/17/21 0858  BP: (!) 150/90  Pulse: (!) 57  Weight: 59.8 kg  Height: 5\' 3"  (1.6 m)    GEN- The patient is a well appearing elderly female, alert and oriented x 3 today.   Head- normocephalic, atraumatic Eyes-  Sclera clear, conjunctiva pink Ears- hearing intact Oropharynx- clear Neck- supple  Lungs- Clear to ausculation bilaterally, normal work of breathing Heart- Regular rate and rhythm, no murmurs, rubs or gallops  GI- soft, NT, ND, + BS Extremities- no clubbing, cyanosis, or edema MS- no significant deformity or atrophy Skin- no rash or lesion Psych- euthymic mood, full affect Neuro- strength and sensation are intact  Wt Readings from Last 3 Encounters:  05/17/21 59.8 kg  05/08/21 58.5 kg  04/30/21 59.9 kg    EKG today demonstrates  SB Vent. rate 57 BPM PR interval 184 ms QRS duration 92 ms QT/QTcB 422/410 ms  Echo 09/26/20 demonstrated  1. Left ventricular ejection fraction, by estimation, is 60 to 65%. The  left ventricle has normal function. The left ventricle has no regional  wall motion abnormalities. Left ventricular diastolic parameters are  indeterminate.   2. Right ventricular systolic function is normal. The right ventricular  size is normal. There is moderately elevated pulmonary artery systolic  pressure. The estimated right ventricular systolic pressure is 85.0 mmHg.   3. Left atrial size was severely dilated.   4. Right atrial size was severely dilated.   5. The mitral valve is normal in structure. Trivial mitral valve   regurgitation. No evidence of mitral stenosis.   6. Tricuspid valve regurgitation is moderate.   7. The aortic valve is tricuspid. Aortic valve regurgitation is trivial.  Mild to moderate aortic valve sclerosis/calcification is present, without  any evidence of aortic stenosis.   8. The inferior vena cava is normal in size with greater than 50%  respiratory variability, suggesting right atrial pressure of 3 mmHg.   Epic records are reviewed at length today  CHA2DS2-VASc Score = 5  The patient's score is based upon: CHF History: No HTN History: Yes Diabetes History: No Stroke History: No Vascular Disease History: Yes Age Score: 2 Gender Score: 1      ASSESSMENT AND PLAN: 1. Paroxysmal Atrial Fibrillation/atrial flutter The patient's CHA2DS2-VASc score is 5, indicating a 7.2% annual risk of stroke.   Failed flecainide and amiodarone. We discussed therapeutic options today, specifically dofetilide. Patient hesitant to proceed with dofetilide admission since her symptoms are different than with afib previously. Will have her wear a 3 day Zio patch to evaluate. Patient in agreement with plan.  Will start diltiazem 30 mg PRN q 4 hours for palpitations.  Continue Eliquis 2.5 mg BID Continue Toprol 25 mg daily  2. Secondary Hypercoagulable State (ICD10:  D68.69) The patient is at significant risk for stroke/thromboembolism based upon her CHA2DS2-VASc Score of 5.  Continue Apixaban (Eliquis).   3. HTN Stable, no changes today.  4. Obstructive sleep apnea The importance of adequate treatment of sleep apnea was discussed today in order to improve our ability to maintain sinus rhythm long term. Followed by Dr Claiborne Billings   Follow up in the AF clinic pending monitor results.    [facility] [address] [place], [place] [address] [phone] 05/17/2021 9:03 AM

## 2021-05-17 ENCOUNTER — Ambulatory Visit (HOSPITAL_COMMUNITY)
Admission: RE | Admit: 2021-05-17 | Discharge: 2021-05-17 | Disposition: A | Discharge status: Home / Self Care | Payer: Medicare Other | Source: Ambulatory Visit | Attending: Physician Assistant | Admitting: Physician Assistant

## 2021-05-17 ENCOUNTER — Encounter (HOSPITAL_COMMUNITY): Payer: Self-pay | Admitting: Physician Assistant

## 2021-05-17 ENCOUNTER — Other Ambulatory Visit: Payer: Self-pay

## 2021-05-17 VITALS — BP 150/90 | HR 57 | Ht 63.0 in | Wt 131.8 lb

## 2021-05-17 DIAGNOSIS — Z79899 Other long term (current) drug therapy: Secondary | ICD-10-CM | POA: Diagnosis not present

## 2021-05-17 DIAGNOSIS — J449 Chronic obstructive pulmonary disease, unspecified: Secondary | ICD-10-CM | POA: Insufficient documentation

## 2021-05-17 DIAGNOSIS — I480 Paroxysmal atrial fibrillation: Secondary | ICD-10-CM

## 2021-05-17 DIAGNOSIS — Z87891 Personal history of nicotine dependence: Secondary | ICD-10-CM | POA: Insufficient documentation

## 2021-05-17 DIAGNOSIS — R002 Palpitations: Secondary | ICD-10-CM | POA: Insufficient documentation

## 2021-05-17 DIAGNOSIS — D6869 Other thrombophilia: Secondary | ICD-10-CM | POA: Insufficient documentation

## 2021-05-17 DIAGNOSIS — I4819 Other persistent atrial fibrillation: Secondary | ICD-10-CM | POA: Insufficient documentation

## 2021-05-17 DIAGNOSIS — G4733 Obstructive sleep apnea (adult) (pediatric): Secondary | ICD-10-CM | POA: Insufficient documentation

## 2021-05-17 DIAGNOSIS — I517 Cardiomegaly: Secondary | ICD-10-CM | POA: Diagnosis not present

## 2021-05-17 DIAGNOSIS — Z7901 Long term (current) use of anticoagulants: Secondary | ICD-10-CM | POA: Insufficient documentation

## 2021-05-17 DIAGNOSIS — I4892 Unspecified atrial flutter: Secondary | ICD-10-CM | POA: Insufficient documentation

## 2021-05-17 DIAGNOSIS — I10 Essential (primary) hypertension: Secondary | ICD-10-CM | POA: Insufficient documentation

## 2021-05-17 DIAGNOSIS — E785 Hyperlipidemia, unspecified: Secondary | ICD-10-CM | POA: Diagnosis not present

## 2021-05-17 MED ORDER — DILTIAZEM HCL 30 MG PO TABS: ORAL_TABLET | ORAL | 1 refills | Status: DC

## 2021-05-17 NOTE — Patient Instructions (Signed)
Cardizem 30mg -- take 1 tablet every 4 hours AS NEEDED for heart rate >100 as long as top number of blood pressure >100.  

## 2021-05-22 DIAGNOSIS — R002 Palpitations: Secondary | ICD-10-CM | POA: Diagnosis not present

## 2021-05-23 ENCOUNTER — Encounter (HOSPITAL_COMMUNITY): Payer: Self-pay

## 2021-06-17 ENCOUNTER — Ambulatory Visit: Payer: Medicare Other | Admitting: Family Medicine

## 2021-06-17 ENCOUNTER — Ambulatory Visit: Payer: Medicare Other | Admitting: Neurology

## 2021-07-15 ENCOUNTER — Ambulatory Visit (INDEPENDENT_AMBULATORY_CARE_PROVIDER_SITE_OTHER): Payer: Medicare Other | Admitting: General Practice

## 2021-07-15 ENCOUNTER — Other Ambulatory Visit: Payer: Self-pay

## 2021-07-15 ENCOUNTER — Encounter: Payer: Self-pay | Admitting: General Practice

## 2021-07-15 ENCOUNTER — Telehealth: Payer: Self-pay | Admitting: Internal Medicine

## 2021-07-15 VITALS — BP 130/80 | HR 56 | Ht 63.0 in | Wt 131.2 lb

## 2021-07-15 DIAGNOSIS — I10 Essential (primary) hypertension: Secondary | ICD-10-CM

## 2021-07-15 DIAGNOSIS — Z9989 Dependence on other enabling machines and devices: Secondary | ICD-10-CM

## 2021-07-15 DIAGNOSIS — G4733 Obstructive sleep apnea (adult) (pediatric): Secondary | ICD-10-CM | POA: Diagnosis not present

## 2021-07-15 DIAGNOSIS — I480 Paroxysmal atrial fibrillation: Secondary | ICD-10-CM

## 2021-07-15 MED ORDER — CARVEDILOL 6.25 MG PO TABS: 6.2500 mg | ORAL_TABLET | Freq: Two times a day (BID) | ORAL | 3 refills | Status: DC

## 2021-07-15 MED ORDER — CARVEDILOL 3.125 MG PO TABS: 3.1250 mg | ORAL_TABLET | Freq: Two times a day (BID) | ORAL | 3 refills | Status: DC

## 2021-07-15 NOTE — Progress Notes (Signed)
Cardiology Clinic Note   Patient Name: Holly Benson Date of Encounter: 07/15/2021  Primary Care Provider:  Michael Boston, MD Primary Cardiologist:  Pixie Casino, MD  Patient Profile    Holly Benson presents to the clinic today for follow-up of her paroxysmal atrial fibrillation and palpitations  Past Medical History    Past Medical History:  Diagnosis Date   Arrhythmia    Arthritis    Atrial fibrillation ([place])    Constipation    chronic   COPD (chronic obstructive pulmonary disease) ([place])    Hyperlipidemia    Hypertension    pulmonary   OSA on CPAP    Osteoporosis    RLS (restless legs syndrome)    Sleep apnea    Past Surgical History:  Procedure Laterality Date   ABDOMINAL AORTOGRAM W/LOWER EXTREMITY N/A 03/21/2020   Procedure: ABDOMINAL AORTOGRAM W/ Bilateral LOWER EXTREMITY Runoff;  Surgeon: Wellington Hampshire, MD;  Location: [facility];  Service: Cardiovascular;  Laterality: N/A;   ABDOMINAL HYSTERECTOMY     arm surgery Right    Broken arm and has a plate in it   CATARACT EXTRACTION Bilateral    COLONOSCOPY     More than 10 years ago In [place] ARTHROSCOPY Right    PERIPHERAL VASCULAR INTERVENTION Bilateral 03/21/2020   Procedure: PERIPHERAL VASCULAR INTERVENTION;  Surgeon: Wellington Hampshire, MD;  Location: [facility];  Service: Cardiovascular;  Laterality: Bilateral;  external iliac    Allergies  Allergies  Allergen Reactions   Lovenox [Enoxaparin Sodium] Hives and Rash    PT STATES SHE BROKE OUT IN A RASH HEAD TO TOE AND LASTED ABOUT 3 WEEKS    Metoprolol Succinate [Metoprolol] Other (See Comments)    Muscle aches, hand pain, and tingling    History of Present Illness    Holly Benson has a PMH of hypertension, atrial flutter, hyperlipidemia, obstructive sleep apnea, COPD, peripheral arterial disease, and atrial fibrillation.  CHA2DS2-VASc score 5 on Eliquis.  She was last seen by Malka So, PA-C on 05/17/2021.   Of note she was previously on amiodarone which was discontinued due to lung toxicity.  When she was seen in the atrial fibrillation clinic she described a frequent fluttering type sensation which would last for 10 to 15 minutes and happen multiple times per day.  She was started on diltiazem 30 mg daily.  She contacted the nurse triage line 07/11/2021.  Her metoprolol was transitioned to carvedilol.  With metoprolol she did notice tingling and numbness with an icy type pain in her thumb and first 2 fingers on each hand.  She presents the clinic today for follow-up evaluation states she was out of town for several days and noticed that her blood pressure was low.  She reports that she stopped taking her afternoon dose of hydralazine and did not notice much change with her blood pressure.  She discontinued her morning dose of hydralazine as well.  She noticed a pain in her hands and tingling with metoprolol.  She also notices some occasional ankle edema.  I will change her metoprolol to carvedilol, give her the [place] support stocking sheet, discontinue her hydralazine, give her the salty 6 diet sheet, have her maintain her physical activity, and follow-up in 1 to 2 months.  Have asked her to maintain a blood pressure log.  She reported that she contacted the office earlier last week for follow-up appointment.  She was told that she would not  be scheduled for an appointment with Dr. Debara Pickett for several months.  She contacted the patient advocate representatives with her complaint.  She was added onto my schedule today.  Today she denies chest pain, shortness of breath, lower extremity edema, fatigue, palpitations, melena, hematuria, hemoptysis, diaphoresis, weakness, presyncope, syncope, orthopnea, and PND.  Home Medications    Prior to Admission medications   Medication Sig Start Date End Date Taking? Authorizing Provider  acetaminophen (TYLENOL) 325 MG tablet Take 650 mg by mouth every 6 (six) hours as  needed for moderate pain or headache.    [provider]  albuterol (VENTOLIN HFA) 108 (90 Base) MCG/ACT inhaler Inhale 2 puffs into the lungs every 6 (six) hours as needed for wheezing or shortness of breath. 07/12/20   Collene Gobble, MD  apixaban (ELIQUIS) 2.5 MG TABS tablet Take 1 tablet (2.5 mg total) by mouth 2 (two) times daily. 05/08/21   Minus Breeding, MD  Carboxymethylcellulose Sodium (THERATEARS OP) Place 1 drop into both eyes daily as needed (dry eyes).    [provider]  cetirizine (ZYRTEC) 10 MG tablet Take 10 mg by mouth daily as needed for allergies.     [provider]  Coenzyme Q10 (COQ-10) 100 MG CAPS Take 100 mg by mouth daily.    [provider]  diltiazem (CARDIZEM) 30 MG tablet Take 1 tablet every 4 hours AS NEEDED for heart rate >100 05/17/21   Fenton, Clint R, PA  gabapentin (NEURONTIN) 100 MG capsule Take 1 capsule by mouth in the morning and 2 capsules in the evening.    [provider]  hydrALAZINE (APRESOLINE) 10 MG tablet Take 10 mg by mouth 2 (two) times daily.    [provider]  hydrochlorothiazide (HYDRODIURIL) 25 MG tablet TAKE ONE TABLET BY MOUTH DAILY 01/30/21   Hilty, Nadean Corwin, MD  irbesartan (AVAPRO) 75 MG tablet TAKE ONE TABLET BY MOUTH DAILY 10/18/20   Hilty, Nadean Corwin, MD  metoprolol succinate (TOPROL XL) 25 MG 24 hr tablet Take 1 tablet (25 mg total) by mouth daily. In the evening 04/30/21   Camnitz, Ocie Doyne, MD  Misc Natural Products (COLON CLEANSE) CAPS Take 4 capsules by mouth daily as needed (constipation).    [provider]  Multiple Vitamins-Minerals (PRESERVISION AREDS 2 PO) Take by mouth in the morning and at bedtime.    [provider]  Peppermint Oil (IBGARD PO) Take 2 capsules by mouth daily. OTC 1 hour before dinner    [provider]  Plecanatide (TRULANCE) 3 MG TABS Take 1 tablet by mouth daily. 04/24/21   Esterwood, Amy S, PA-C  polyethylene glycol (MIRALAX /  GLYCOLAX) 17 g packet Take 17 g by mouth daily as needed.    [provider]  rOPINIRole (REQUIP) 0.25 MG tablet Take 1 tablet in the morning and 2 tablets at bedtime. 04/23/21   Lomax, Amy, NP  STIOLTO RESPIMAT 2.5-2.5 MCG/ACT AERS USE 2 INHALATIONS ORALLY   EVERY MORNING 08/23/20   Byrum, Rose Fillers, MD  zinc gluconate 50 MG tablet Take 50 mg by mouth daily.    [provider]    Family History    Family History  Problem Relation Age of Onset   Suicidality Father    Stroke Maternal Grandfather    Hypertension Sister    Colon cancer Neg Hx    Esophageal cancer Neg Hx    Pancreatic cancer Neg Hx    Stomach cancer Neg Hx    Liver disease  Neg Hx    She indicated that her mother is deceased. She indicated that her father is deceased. She indicated that her sister is alive. She indicated that her maternal grandfather is deceased. She indicated that the status of her neg hx is unknown.  Social History    Social History   Socioeconomic History   Marital status: Widowed    Spouse name: Not on file   Number of children: 3   Years of education: Not on file   Highest education level: Not on file  Occupational History   Occupation: retired    Comment: retired Licensed conveyancer  Tobacco Use   Smoking status: Former    Packs/day: 1.50    Years: 30.00    Pack years: 45.00    Types: Cigarettes    Start date: 1956    Quit date: 11/04/1991    Years since quitting: 29.7   Smokeless tobacco: Never  Vaping Use   Vaping Use: Never used  Substance and Sexual Activity   Alcohol use: Yes    Alcohol/week: 1.0 standard drink    Types: 1 Glasses of wine per week    Comment: nightly   Drug use: No   Sexual activity: Not Currently  Other Topics Concern   Not on file  Social History Narrative   Lives with daughter   Social Determinants of Health   Financial Resource Strain: Not on file  Food Insecurity: Not on file  Transportation Needs: Not on file  Physical Activity: Not on  file  Stress: Not on file  Social Connections: Not on file  Intimate Partner Violence: Not on file     Review of Systems    General:  No chills, fever, night sweats or weight changes.  Cardiovascular:  No chest pain, dyspnea on exertion, edema, orthopnea, palpitations, paroxysmal nocturnal dyspnea. Dermatological: No rash, lesions/masses Respiratory: No cough, dyspnea Urologic: No hematuria, dysuria Abdominal:   No nausea, vomiting, diarrhea, bright red blood per rectum, melena, or hematemesis Neurologic:  No visual changes, wkns, changes in mental status. All other systems reviewed and are otherwise negative except as noted above.  Physical Exam    VS:  BP 130/80 (BP Location: Left Arm)   Pulse (!) 56   Ht '5\' 3"'$  (1.6 m)   Wt 131 lb 3.2 oz (59.5 kg)   SpO2 97%   BMI 23.24 kg/m  , BMI Body mass index is 23.24 kg/m. GEN: Well nourished, well developed, in no acute distress. HEENT: normal. Neck: Supple, no JVD, carotid bruits, or masses. Cardiac: RRR, no murmurs, rubs, or gallops. No clubbing, cyanosis, edema.  Radials/DP/PT 2+ and equal bilaterally.  Respiratory:  Respirations regular and unlabored, clear to auscultation bilaterally. GI: Soft, nontender, nondistended, BS + x 4. MS: no deformity or atrophy. Skin: warm and dry, no rash. Neuro:  Strength and sensation are intact. Psych: Normal affect.  Accessory Clinical Findings    Recent Labs: 08/28/2020: TSH 2.770 12/20/2020: BUN 23; Creatinine, Ser 0.87; Potassium 4.0; Sodium 135 01/18/2021: Hemoglobin 13.4; Platelets 400.0   Recent Lipid Panel    Component Value Date/Time   CHOL 149 04/11/2020 0838   TRIG 60 04/11/2020 0838   HDL 69 04/11/2020 0838   CHOLHDL 2.2 04/11/2020 0838   LDLCALC 68 04/11/2020 0838    ECG personally reviewed by me today-none today.  EKG 05/17/2021 Sinus bradycardia 57 bpm   Echocardiogram 09/26/2020 IMPRESSIONS     1. Left ventricular ejection fraction, by estimation, is 60 to  65%. The  left ventricle has normal function. The left ventricle has no regional  wall motion abnormalities. Left ventricular diastolic parameters are  indeterminate.   2. Right ventricular systolic function is normal. The right ventricular  size is normal. There is moderately elevated pulmonary artery systolic  pressure. The estimated right ventricular systolic pressure is Q000111Q mmHg.   3. Left atrial size was severely dilated.   4. Right atrial size was severely dilated.   5. The mitral valve is normal in structure. Trivial mitral valve  regurgitation. No evidence of mitral stenosis.   6. Tricuspid valve regurgitation is moderate.   7. The aortic valve is tricuspid. Aortic valve regurgitation is trivial.  Mild to moderate aortic valve sclerosis/calcification is present, without  any evidence of aortic stenosis.   8. The inferior vena cava is normal in size with greater than 50%  respiratory variability, suggesting right atrial pressure of 3 mmHg.   FINDINGS   Left Ventricle: Left ventricular ejection fraction, by estimation, is 60  to 65%. The left ventricle has normal function. The left ventricle has no  regional wall motion abnormalities. The left ventricular internal cavity  size was normal in size. There is   no left ventricular hypertrophy. Left ventricular diastolic parameters  are indeterminate.   Right Ventricle: The right ventricular size is normal. Right vetricular  wall thickness was not assessed. Right ventricular systolic function is  normal. There is moderately elevated pulmonary artery systolic pressure.  The tricuspid regurgitant velocity is   3.38 m/s, and with an assumed right atrial pressure of 3 mmHg, the  estimated right ventricular systolic pressure is Q000111Q mmHg.   Left Atrium: Left atrial size was severely dilated.   Right Atrium: Right atrial size was severely dilated.   Pericardium: There is no evidence of pericardial effusion.   Mitral Valve: The  mitral valve is normal in structure. Trivial mitral  valve regurgitation. No evidence of mitral valve stenosis.   Tricuspid Valve: The tricuspid valve is normal in structure. Tricuspid  valve regurgitation is moderate.   Aortic Valve: The aortic valve is tricuspid. Aortic valve regurgitation is  trivial. Aortic regurgitation PHT measures 663 msec. Mild to moderate  aortic valve sclerosis/calcification is present, without any evidence of  aortic stenosis.   Pulmonic Valve: The pulmonic valve was not well visualized. Pulmonic valve  regurgitation is not visualized.   Aorta: The aortic root and ascending aorta are structurally normal, with  no evidence of dilitation.   Venous: The inferior vena cava is normal in size with greater than 50%  respiratory variability, suggesting right atrial pressure of 3 mmHg.   IAS/Shunts: No atrial level shunt detected by color flow Doppler.   Assessment & Plan   1.  Paroxysmal atrial fibrillation-heart rate today 56 BPM.  Heart rate much better controlled with addition of carvedilol.  Reports compliance with her apixaban and denies bleeding issues. Continue diltiazem, apixaban Start carvedilol 6.25 BID Heart healthy low-sodium diet-salty 6 given Increase physical activity as tolerated Avoid triggers caffeine, chocolate, EtOH, dehydration etc. Stop Metoprolol  Essential hypertension-BP today 130/80.  Well-controlled at home. Continue diltiazem, hydralazine, hydrochlorothiazide, irbesartan,  start carvedilol Heart healthy low-sodium diet-salty 6 given Increase physical activity as tolerated Maintain blood pressure log  Obstructive sleep apnea-reports compliance with CPAP.  Waking up in the morning rested. Continue CPAP use  Disposition: Follow-up with Dr. Debara Pickett in 6 months.  Jossie Ng. Ryler Laskowski NP-C    07/15/2021, 12:36 PM [place] [address]  Office [phone] Fax [phone]  Notice: This  dictation was prepared with Dragon dictation along with smaller phrase technology. Any transcriptional errors that result from this process are unintentional and may not be corrected upon review.  I spent 13 minutes examining this patient, reviewing medications, and using patient centered shared decision making involving her cardiac care.  Prior to her visit I spent greater than 20 minutes reviewing her past medical history,  medications, and prior cardiac tests.

## 2021-07-15 NOTE — Telephone Encounter (Signed)
Deberah Pelton, NP  Fidel Levy, RN Caller: Unspecified (Today,  3:27 PM) Please start patient on 6.25 mg twice daily of carvedilol.  Thank you.   Jossie Ng. Cleaver NP-C   07/15/2021, 4:42 PM  [facility]  [place] [address]  Office [phone] Fax [phone]    Pharmacy updated

## 2021-07-15 NOTE — Patient Instructions (Signed)
Medication Instructions:  STOP METOPROLOL  START CARVIDELOL 6.25 TWICE DAILY  *If you need a refill on your cardiac medications before your next appointment, please call your pharmacy*  Lab Work:   Testing/Procedures:  NONE    NONE  Special Instructions TAKE AND LOG YOUR BLOOD PRESSURE LOG  PLEASE MAINTAIN PHYSICAL ACTIVITY AS TOLERATED,   PLEASE PURCHASE AND WEAR COMPRESSION STOCKINGS DAILY AND TAKE OFF AT BEDTIME. Compression stockings are elastic socks that squeeze the legs. They help to increase blood flow to the legs and to decrease swelling in the legs from fluid retention, and reduce the chance of developing blood clots in the lower legs. Please put on in the AM when dressing and off at night when dressing for bed.  LET THEM KNOW THAT YOU NEED KNEE HIGH'S WITH COMPRESSION OF 15-20 mmhg.  [employer]  [place] ([place] [phone]); [place], [place] [address]; [phone]  EMAIL   [email or web address].  PLEASE MAKE SURE TO ELEVATE YOUR FEET & LEGS WHILE SITTING, THIS WILL HELP WITH THE SWELLING ALSO.    Follow-Up: Your next appointment:1-2 month(s) In Person with K. Mali Hilty, MD OR IF UNAVAILABLE JESSE CLEAVER, FNP-C   At [facility], you and your health needs are our priority.  As part of our continuing mission to provide you with exceptional heart care, we have created designated Provider Care Teams.  These Care Teams include your primary Cardiologist (physician) and Advanced Practice Providers (APPs -  Physician Assistants and Nurse Practitioners) who all work together to provide you with the care you need, when you need it.

## 2021-07-15 NOTE — Telephone Encounter (Signed)
Spoke with pharmacy. A.Tillery PA prescribed carvedilol 3.'125mg'$  BID today and J.Cleaver NP prescribed carvedilol 6.'25mg'$  BID. She was on metoprolol and this was changed to carvedilol (see patient message 07/11/21). Routed to [place] NP to clarify dose and update pharmacy

## 2021-07-15 NOTE — Telephone Encounter (Signed)
Pt c/o medication issue:  1. Name of Medication: carvedilol (COREG) 3.125 MG tablet  carvedilol (COREG) 6.25 MG tablet  2. How are you currently taking this medication (dosage and times per day)? 1 tablet twice daily   3. Are you having a reaction (difficulty breathing--STAT)? no  4. What is your medication issue? Pharmacy calling to see which prescription is correct to use. Same medication two different dosage Please advise

## 2021-07-30 ENCOUNTER — Other Ambulatory Visit (HOSPITAL_BASED_OUTPATIENT_CLINIC_OR_DEPARTMENT_OTHER): Payer: Self-pay

## 2021-07-30 MED ORDER — INFLUENZA VAC A&B SA ADJ QUAD 0.5 ML IM PRSY
PREFILLED_SYRINGE | INTRAMUSCULAR | 0 refills | Status: DC
  Filled 2021-07-30: qty 0.5, 1d supply, fill #0

## 2021-07-31 DIAGNOSIS — D0439 Carcinoma in situ of skin of other parts of face: Secondary | ICD-10-CM | POA: Diagnosis not present

## 2021-07-31 DIAGNOSIS — L570 Actinic keratosis: Secondary | ICD-10-CM | POA: Diagnosis not present

## 2021-07-31 DIAGNOSIS — D485 Neoplasm of uncertain behavior of skin: Secondary | ICD-10-CM | POA: Diagnosis not present

## 2021-07-31 DIAGNOSIS — Z85828 Personal history of other malignant neoplasm of skin: Secondary | ICD-10-CM | POA: Diagnosis not present

## 2021-08-02 NOTE — Telephone Encounter (Signed)
Spoke to pt regarding Holly Benson. Pt state she wore a monitor in July which showed irregular heart rhythm. She state during that time her symptoms were fairly infrequent. However they have increased in frequency and yesterday was the most she's ever experienced. She does report occasional SOB and lightheadedness. But denies symptoms currently.   Pt state she is unable to determine if irregular rhythm is PAC's or if she's back in Afib. However, pt confirmed she is taking Eliquis as prescribed. Appointment scheduled on 10/3 with Doreene Adas, PA for further evaluations. Pt also made aware of ED precaution should any new symptoms develop or worsen.

## 2021-08-05 DIAGNOSIS — H903 Sensorineural hearing loss, bilateral: Secondary | ICD-10-CM | POA: Diagnosis not present

## 2021-08-05 DIAGNOSIS — Z822 Family history of deafness and hearing loss: Secondary | ICD-10-CM | POA: Diagnosis not present

## 2021-08-05 NOTE — Progress Notes (Signed)
Cardiology Office Note:    Date:  08/06/2021   ID:  Holly Benson, DOB 05-08-1933, MRN [id number]  PCP:  Michael Boston, MD  Cardiologist:  Pixie Casino, MD   Referring MD: Michael Boston, MD   Chief Complaint  Patient presents with   Irregular Heart Beat    History of Present Illness:    Holly Benson is a 87 y.o. female with a hx of typical atrial flutter, PAF, PVD, HTN, HLD, OSA with incomplete compliance on CPAP. She did not tolerate flecainide for her PAF. She was transitioned to amiodarone. SPB and history of pulmonary hypertension felt multifactorial given OSA, COPD and possible pulmonary fibrosis. Unfortunately, amiodarone was discontinued due to lung toxicity (changes seen on CT chest). She reported increased heart fluttering in Afib clinic and wore a heart monitor which showed PACs, no Afib. She was on metoprolol, but had side effects and was transitioned to coreg.   She messaged our office with increased palpitations and questions if she is having more Afib. She was added to my schedule. She is here alone. She stopped amiodarone about May and was doing fine until about a month ago - started having palpitations that are becoming more frequent. She does not know how to distinguish between PACs and PAF. She has palpitations at least once daily, usually 30 minutes.  No dizziness or pre/syncope with palpitations. She has not taken PRN cardizem. She shows me a very nice BP record, but has been having SBP in the 80-100s during the day. No bleeding issues.     Past Medical History:  Diagnosis Date   Arrhythmia    Arthritis    Atrial fibrillation (HCC)    Constipation    chronic   COPD (chronic obstructive pulmonary disease) (HCC)    Hyperlipidemia    Hypertension    pulmonary   OSA on CPAP    Osteoporosis    RLS (restless legs syndrome)    Sleep apnea     Past Surgical History:  Procedure Laterality Date   ABDOMINAL AORTOGRAM W/LOWER EXTREMITY N/A 03/21/2020    Procedure: ABDOMINAL AORTOGRAM W/ Bilateral LOWER EXTREMITY [place];  Surgeon: Wellington Hampshire, MD;  Location: [facility];  Service: Cardiovascular;  Laterality: N/A;   ABDOMINAL HYSTERECTOMY     arm surgery Right    Broken arm and has a plate in it   CATARACT EXTRACTION Bilateral    COLONOSCOPY     More than 10 years ago In [place] ARTHROSCOPY Right    PERIPHERAL VASCULAR INTERVENTION Bilateral 03/21/2020   Procedure: PERIPHERAL VASCULAR INTERVENTION;  Surgeon: Wellington Hampshire, MD;  Location: [facility];  Service: Cardiovascular;  Laterality: Bilateral;  external iliac    Current Medications: No outpatient medications have been marked as taking for the 08/06/21 encounter (Office Visit) with Ledora Bottcher, [place].     Allergies:   Lovenox [enoxaparin sodium] and Metoprolol succinate [metoprolol]   Social History   Socioeconomic History   Marital status: Widowed    Spouse name: Not on file   Number of children: 3   Years of education: Not on file   Highest education level: Not on file  Occupational History   Occupation: retired    Comment: retired Licensed conveyancer  Tobacco Use   Smoking status: Former    Packs/day: 1.50    Years: 30.00    Pack years: 45.00    Types: Cigarettes    Start date: 1956  Quit date: 11/04/1991    Years since quitting: 29.7   Smokeless tobacco: Never  Vaping Use   Vaping Use: Never used  Substance and Sexual Activity   Alcohol use: Yes    Alcohol/week: 1.0 standard drink    Types: 1 Glasses of wine per week    Comment: nightly   Drug use: No   Sexual activity: Not Currently  Other Topics Concern   Not on file  Social History Narrative   Lives with daughter   Social Determinants of Health   Financial Resource Strain: Not on file  Food Insecurity: Not on file  Transportation Needs: Not on file  Physical Activity: Not on file  Stress: Not on file  Social Connections: Not on file     Family History: The  patient's family history includes Hypertension in her sister; Stroke in her maternal grandfather; Suicidality in her father. There is no history of Colon cancer, Esophageal cancer, Pancreatic cancer, Stomach cancer, or Liver disease.  ROS:   Please see the history of present illness.     All other systems reviewed and are negative.  EKGs/Labs/Other Studies Reviewed:    The following studies were reviewed today:  Heart monitor 05/22/21: Max 124 bpm 09:07am, 07/16 Min 50 bpm 06:48am, 07/17 Avg 71 bpm 16.8% supraventricular ectopy <1% ventricular ectopy No atrial fibrillation noted Predominant rhythm was sinus rhythm Symptoms associated with sinus rhythm with PACs and atrial runs  EKG:  EKG is  ordered today.  The ekg ordered today demonstrates sinus rhythm HR 62  Recent Labs: 08/28/2020: TSH 2.770 12/20/2020: BUN 23; Creatinine, Ser 0.87; Potassium 4.0; Sodium 135 01/18/2021: Hemoglobin 13.4; Platelets 400.0  Recent Lipid Panel    Component Value Date/Time   CHOL 149 04/11/2020 0838   TRIG 60 04/11/2020 0838   HDL 69 04/11/2020 0838   CHOLHDL 2.2 04/11/2020 0838   LDLCALC 68 04/11/2020 0838    Physical Exam:    VS:  BP 128/70   Pulse 62   Ht 5\' 3"  (1.6 m)   Wt 135 lb 6.4 oz (61.4 kg)   BMI 23.99 kg/m     Wt Readings from Last 3 Encounters:  08/06/21 135 lb 6.4 oz (61.4 kg)  07/15/21 131 lb 3.2 oz (59.5 kg)  05/17/21 131 lb 12.8 oz (59.8 kg)     GEN: elderly female in NAD HEENT: Normal NECK: No JVD; No carotid bruits LYMPHATICS: No lymphadenopathy CARDIAC: RRR, no murmurs, rubs, gallops RESPIRATORY:  Clear to auscultation without rales, wheezing or rhonchi  ABDOMEN: Soft, non-tender, non-distended MUSCULOSKELETAL:  No edema; No deformity  SKIN: Warm and dry NEUROLOGIC:  Alert and oriented x 3 PSYCHIATRIC:  Normal affect   ASSESSMENT:    1. PAF (paroxysmal atrial fibrillation) ([place])   2. PAC (premature atrial contraction)   3. Palpitations   4. Chronic  anticoagulation   5. Essential hypertension   6. OSA on CPAP    PLAN:    In order of problems listed above:  Palpitations PACs Paroxysmal atrial fibrillation/flutter Chronic anticoagulation Hypertension - she did not tolerate flecainide or amiodarone - she did not tolerate metoprolol due to Raynaud's syndrome - she has been doing OK on coreg - she has not been taking PRN cardizem This patients CHA2DS2-VASc Score and unadjusted Ischemic Stroke Rate (% per year) is equal to 4.8 % stroke rate/year from a score of 4 (4age, HTN, female) We discussed that she is tolerating the palpitations well in that she is not have dizziness or pre/syncope.  We also discussed that she is protected from stroke (weight is up here from home, I will not adjust eliquis dose).  I do not have room to increase coreg due to soft BP. She has not been using PRN cardizem. I will reduce her HCTZ to 12.5 mg to give more pressure room to use PRN cardizem during the day. If she wakes up with palpitations, which happens often, I advised to take her coreg as scheduled. If she has issues with "breakthrough" palpitations during the day, she will use 30 mg cardizem. She will let me know if she develops SOB or edema on lower dose of HCTZ. May be able to just discontinue altogether if doing well at follow up in order to increase coreg.    OSA - compliant on CPAP   Follow up with Dr. Debara Pickett or myself in 2 months.    Medication Adjustments/Labs and Tests Ordered: Current medicines are reviewed at length with the patient today.  Concerns regarding medicines are outlined above.  Orders Placed This Encounter  Procedures   EKG 12-Lead   Meds ordered this encounter  Medications   hydrochlorothiazide (HYDRODIURIL) 12.5 MG tablet    Sig: Take 1 tablet (12.5 mg total) by mouth daily.    Dispense:  90 tablet    Refill:  3    Dose decrease    Signed, Ledora Bottcher, [place]  08/06/2021 12:08 PM     [facility]

## 2021-08-06 ENCOUNTER — Encounter: Payer: Self-pay | Admitting: Physician Assistant

## 2021-08-06 ENCOUNTER — Ambulatory Visit (INDEPENDENT_AMBULATORY_CARE_PROVIDER_SITE_OTHER): Payer: Medicare Other | Admitting: Physician Assistant

## 2021-08-06 ENCOUNTER — Other Ambulatory Visit: Payer: Self-pay

## 2021-08-06 VITALS — BP 128/70 | HR 62 | Ht 63.0 in | Wt 135.4 lb

## 2021-08-06 DIAGNOSIS — G4733 Obstructive sleep apnea (adult) (pediatric): Secondary | ICD-10-CM

## 2021-08-06 DIAGNOSIS — Z9989 Dependence on other enabling machines and devices: Secondary | ICD-10-CM

## 2021-08-06 DIAGNOSIS — I10 Essential (primary) hypertension: Secondary | ICD-10-CM | POA: Diagnosis not present

## 2021-08-06 DIAGNOSIS — R002 Palpitations: Secondary | ICD-10-CM

## 2021-08-06 DIAGNOSIS — I491 Atrial premature depolarization: Secondary | ICD-10-CM

## 2021-08-06 DIAGNOSIS — I480 Paroxysmal atrial fibrillation: Secondary | ICD-10-CM

## 2021-08-06 DIAGNOSIS — Z7901 Long term (current) use of anticoagulants: Secondary | ICD-10-CM

## 2021-08-06 MED ORDER — HYDROCHLOROTHIAZIDE 12.5 MG PO TABS: 12.5000 mg | ORAL_TABLET | Freq: Every day | ORAL | 3 refills | Status: DC

## 2021-08-06 NOTE — Patient Instructions (Addendum)
Medication Instructions:  DECREASE HCTZ to 12.5 mg daily  Ok to take Cardizem 30 mg AS NEEDED for palpitations  *If you need a refill on your cardiac medications before your next appointment, please call your pharmacy*  Follow-Up: At [facility], you and your health needs are our priority.  As part of our continuing mission to provide you with exceptional heart care, we have created designated Provider Care Teams.  These Care Teams include your primary Cardiologist (physician) and Advanced Practice Providers (APPs -  Physician Assistants and Nurse Practitioners) who all work together to provide you with the care you need, when you need it.  We recommend signing up for the patient portal called "MyChart".  Sign up information is provided on this After Visit Summary.  MyChart is used to connect with patients for Virtual Visits (Telemedicine).  Patients are able to view lab/test results, encounter notes, upcoming appointments, etc.  Non-urgent messages can be sent to your provider as well.   To learn more about what you can do with MyChart, go to [email or web address].    Your next appointment:   As scheduled 12/6 with [name] NP  Other Instructions Continue blood pressure log

## 2021-08-15 ENCOUNTER — Other Ambulatory Visit: Payer: Self-pay

## 2021-08-15 ENCOUNTER — Ambulatory Visit (INDEPENDENT_AMBULATORY_CARE_PROVIDER_SITE_OTHER): Payer: Medicare Other | Admitting: Neurology

## 2021-08-15 ENCOUNTER — Encounter: Payer: Self-pay | Admitting: Neurology

## 2021-08-15 VITALS — BP 148/68 | HR 72 | Ht 63.0 in | Wt 132.0 lb

## 2021-08-15 DIAGNOSIS — G4731 Primary central sleep apnea: Secondary | ICD-10-CM

## 2021-08-15 DIAGNOSIS — Z789 Other specified health status: Secondary | ICD-10-CM

## 2021-08-15 DIAGNOSIS — G2581 Restless legs syndrome: Secondary | ICD-10-CM

## 2021-08-15 DIAGNOSIS — G620 Drug-induced polyneuropathy: Secondary | ICD-10-CM

## 2021-08-15 DIAGNOSIS — I480 Paroxysmal atrial fibrillation: Secondary | ICD-10-CM | POA: Diagnosis not present

## 2021-08-15 MED ORDER — CLONAZEPAM 0.5 MG PO TABS: 0.5000 mg | ORAL_TABLET | Freq: Every evening | ORAL | 0 refills | Status: DC | PRN

## 2021-08-15 MED ORDER — ROPINIROLE HCL 0.25 MG PO TABS: ORAL_TABLET | ORAL | 1 refills | Status: DC

## 2021-08-15 NOTE — Progress Notes (Signed)
SLEEP MED[facility]INE CLINIC    Provider:  Larey Seat, MD  Primary Care Physician:  Michael Boston, [place] [address]     Referring Provider: Michael Boston, [address] [place],  [place] [address]          Chief Complaint according to patient   Patient presents with:     New Patient (Initial Visit)     pt alone, rm 10. presents today for concerns of RLS. she has struggled with this for years. she states that she was started on clonazepam 0.5 mg several years ago and she would take 1/2 tablet every now and then. a 30 day script would last 6 mths. she states more recently this has worsened and finds she needs the whole tablet and using it more frequently. she asked PCP if there something else she can take for RLS they sent her here.   08-15-2021: RV -Presents today for follow up. She states she continues to have ongoing concerns with RLS symptoms. She also says for > 6 mths she has been having numbness.tingling in bilateral thumb,pointer and middle fingers. Pt also wanted to know if Dr Brett Fairy would be willing to take on her ASV- she is not getting proper f/u care with her machine and would really like to address this here. She is set up with a machine through [facility] and they don't help with mask refits. Her last SS was in 2020.       HISTORY OF PRESENT ILLNESS:  Holly Benson is a 87 y.o. year old  Caucasian female patient seen here upon a referral by PCP on 08/15/2021 Presents today for follow up. She states she continues to have ongoing concerns with RLS symptoms. She also says for > 6 mths she has been having numbness.tingling in bilateral thumb,pointer and middle fingers.  PS : Pt also wanted to know if Dr Brett Fairy would be willing to take on her ASV- she is not getting proper f/u care with her machine ( prescribed by dr Claiborne Billings, Cardiologist) and would really like to address this here. She is set up with a machine through [facility] and they don't help with mask  refits. Her last SS was in 2020 Dr Claiborne Billings. The studies were never read- EPIC NOTE still states  Dr Claiborne Billings to read _ ASV results unknown. Referred by Dr Debara Pickett who follows for atrial fib. Dr Curt Bears started amiodarone, but after 2-3 years she had to stop it. Amiodarone induced myopathy/ neuropathy.   She feels the mask is not fitting he well at all. She wants to change providers for ASV-  08-15-2021: Holly Benson has felt that it was 4 times daily ropinirole and up to 4 times daily gabapentin she was able to get by with little clonazepam intake -maybe 50 tablets a year.   However nothing has come trolled her restless leg syndrome as well as clonazepam.  There is also the feeling that she still has medication induced sleepiness. She feels RLS were worse when she is seated and awake rather than in bed.  She does not suddenly fall asleep but she does feel fatigued and sleepy her on medication.  Her last 3 visit in his office were actually done with nurse practitioner Amy Lomax on 12/18/2020 on 3 6 and on 04-23-21.  I will follow this patient after September 2023 for central apnea if she likes me to.         Chief concern according  to patient :   Holly Benson reports problems with insomnia created by restless legs, she has a feeling of having to rub or mesh her legs and she can hardly sit still.  She has undergone a femoral artery stent earlier this year so there was definitely a peripheral vascular disease, she had a humerus fracture on the right side in 2014 she underwent cataract surgeries and hysterectomy in the 1990s.  She has a longstanding history of hypertension, coronary artery and heart disease and atrial fibrillation she dates the onset of her cardiac problems to 2016.  She has been followed by Lyman Bishop, MD who referred her to the [facility] after her atrial fibrillation diagnosis which is paroxysmal atrial fibrillation.  At the time she was already on Eliquis, chronically  anticoagulated , has failed ca cannel blockers and toprol in rate  control-  taken  Amiodarone since . Dr Curt Bears recommneded medication changes -  Pravachol, Cozaar Microzide and Proventil inhaler.  She is taking Klonopin half milligram tablets. She had an unsuccesfull  Cardioversion-  The first sleep study at [facility] showed a sleep onset after 21 minutes with a sleep efficiency of 73.5% study was performed on 12-27-2018.  She had leg related arousals at index of 5.8/h so periodic limb movements definitely contributed to poor sleep.  She was diagnosed with moderate obstructive sleep apnea an AHI of 16.8 which is still actually mild, severe when she resumed supine sleep position.  There were also some central apnea noted and I would continue to see this as a complex sleep apnea rather than obstructive sleep apnea.  She returned for a CPAP titration which then had to switch to BiPAP modality on 2-28 2020.  Sleep efficiency improved.  She did not have periodic limb movements and related arousals as she was under CPAP and BiPAP.   Her EKG showed PVCs but no atrial fibrillation during the night.  CPAP had been initiated at 5 cm and was titrated up to 11 cmH2O when her AHI was 20.8/h.  BiPAP was then started at 12/8 and titrated to 14/10 but central apneas were still present and the AHI was still 52/h.  She was given a full facemask during the titration.  Since she failed to respond to CPAP and BiPAP she returned for an ASV titration addressing the residual central apneas.  And an ASV pressure of 5 over 4/15 cmH2O her AHI was reduced to 1.1.  Central apneas were eliminated, interestingly snoring is now reported again.  There were no clinically significant periodic limb movements noted.  The setting for the ASV machine is an EPAP of minimum 5 a pressure support of minimum 4 maximum 15 and a breeze rate was set on auto there is no set rate for breathing backup.  She started treatment in October 2020 so not even a year  ago. She also has a diagnosis now of pulmonary hypertension which was not listed previously.  She reports now that she still has restless legs keeping her from sleeping she has struggled with atrial fibrillation and hypertension over the past year again.  Multiple medication adjustments have not yet controlled her blood pressure.  She does not have chest pain headaches or dizziness.  She has been fully vaccinated for Covid with 2 shots.      Family medical /sleep history: no  other family member on CPAP with OSA, insomnia, sleep walkers.    Social history:  Patient is a retired Licensed conveyancer,  and  lives in a household with her daughter and the family-   Family status is widowed , with adult children, 8 grandchildren.  Tobacco use: quit 1993 .   ETOH use, Caffeine intake in form of Coffee( 1 cup in AM ) Soda( /) Tea ( /) or energy drinks. Regular exercise in form of walking.    Hobbies : reading .       Sleep habits are as follows: The patient's dinner time is between 5-7 PM. The patient goes to bed at 9-10 PM and continues to sleep for 2-3  hours, wakes for a long time after that- unclear about the trigger, the first time at 12-1 AM.   The preferred sleep position is laterally, with the support of 1-2 pillows. Dreams are reportedly rare. 6  AM is the usual rise time. The patient wakes up spontaneously.  She reports not feeling refreshed or restored in AM, with symptoms such as dry mouth , morning headaches rarely , and sinus pressure- and residual fatigue.  Naps are taken seldomly.   Review of Systems: Out of a complete 14 system review, the patient complains of only the following symptoms, and all other reviewed systems are negative.:  Fatigue, dizziness, low Bp - shortness of breath - COPD- sleepiness , snoring, fragmented sleep, Insomnia -no  delayed sleep onset because of restless legs- but have trouble to sleep through the night.     How likely are you to doze in the following  situations: 0 = not likely, 1 = slight chance, 2 = moderate chance, 3 = high chance   Sitting and Reading? Watching Television? Sitting inactive in a public place (theater or meeting)? As a passenger in a car for an hour without a break? Lying down in the afternoon when circumstances permit? Sitting and talking to someone? Sitting quietly after lunch without alcohol? In a car, while stopped for a few minutes in traffic?   Total = 2/ 24 points   FSS endorsed at 20/ 63 points.   Social History   Socioeconomic History   Marital status: Widowed    Spouse name: Not on file   Number of children: 3   Years of education: Not on file   Highest education level: Not on file  Occupational History   Occupation: retired    Comment: retired Licensed conveyancer  Tobacco Use   Smoking status: Former    Packs/day: 1.50    Years: 30.00    Pack years: 45.00    Types: Cigarettes    Start date: 1956    Quit date: 11/04/1991    Years since quitting: 29.8   Smokeless tobacco: Never  Vaping Use   Vaping Use: Never used  Substance and Sexual Activity   Alcohol use: Yes    Alcohol/week: 1.0 standard drink    Types: 1 Glasses of wine per week    Comment: nightly   Drug use: No   Sexual activity: Not Currently  Other Topics Concern   Not on file  Social History Narrative   Lives with daughter   Social Determinants of Health   Financial Resource Strain: Not on file  Food Insecurity: Not on file  Transportation Needs: Not on file  Physical Activity: Not on file  Stress: Not on file  Social Connections: Not on file    Family History  Problem Relation Age of Onset   Suicidality Father    Stroke Maternal Grandfather    Hypertension Sister    Colon cancer Neg Hx  Esophageal cancer Neg Hx    Pancreatic cancer Neg Hx    Stomach cancer Neg Hx    Liver disease Neg Hx     Past Medical History:  Diagnosis Date   Arrhythmia    Arthritis    Atrial fibrillation (HCC)    Constipation     chronic   COPD (chronic obstructive pulmonary disease) (HCC)    Hyperlipidemia    Hypertension    pulmonary   OSA on CPAP    Osteoporosis    RLS (restless legs syndrome)    Sleep apnea     Past Surgical History:  Procedure Laterality Date   ABDOMINAL AORTOGRAM W/LOWER EXTREMITY N/A 03/21/2020   Procedure: ABDOMINAL AORTOGRAM W/ Bilateral LOWER EXTREMITY [place];  Surgeon: Wellington Hampshire, MD;  Location: [facility];  Service: Cardiovascular;  Laterality: N/A;   ABDOMINAL HYSTERECTOMY     arm surgery Right    Broken arm and has a plate in it   CATARACT EXTRACTION Bilateral    COLONOSCOPY     More than 10 years ago In [place] ARTHROSCOPY Right    PERIPHERAL VASCULAR INTERVENTION Bilateral 03/21/2020   Procedure: PERIPHERAL VASCULAR INTERVENTION;  Surgeon: Wellington Hampshire, MD;  Location: [facility];  Service: Cardiovascular;  Laterality: Bilateral;  external iliac     Current Outpatient Medications on File Prior to Visit  Medication Sig Dispense Refill   acetaminophen (TYLENOL) 325 MG tablet Take 650 mg by mouth every 6 (six) hours as needed for moderate pain or headache.     albuterol (VENTOLIN HFA) 108 (90 Base) MCG/ACT inhaler Inhale 2 puffs into the lungs every 6 (six) hours as needed for wheezing or shortness of breath. 18 g 6   alendronate (FOSAMAX) 70 MG tablet Take 70 mg by mouth once a week.     apixaban (ELIQUIS) 2.5 MG TABS tablet Take 1 tablet (2.5 mg total) by mouth 2 (two) times daily. 180 tablet 1   Carboxymethylcellulose Sodium (THERATEARS OP) Place 1 drop into both eyes daily as needed (dry eyes).     carvedilol (COREG) 6.25 MG tablet Take 1 tablet (6.25 mg total) by mouth 2 (two) times daily. 60 tablet 3   cetirizine (ZYRTEC) 10 MG tablet Take 10 mg by mouth daily as needed for allergies.      Coenzyme Q10 (COQ-10) 100 MG CAPS Take 100 mg by mouth daily.     diltiazem (CARDIZEM) 30 MG tablet Take 1 tablet every 4 hours AS NEEDED for  heart rate >100 30 tablet 1   gabapentin (NEURONTIN) 100 MG capsule Take 1 capsule by mouth in the morning and 2 capsules in the evening.     hydrochlorothiazide (HYDRODIURIL) 12.5 MG tablet Take 1 tablet (12.5 mg total) by mouth daily. 90 tablet 3   irbesartan (AVAPRO) 75 MG tablet TAKE ONE TABLET BY MOUTH DAILY 90 tablet 3   Misc Natural Products (COLON CLEANSE) CAPS Take 4 capsules by mouth daily as needed (constipation).     Multiple Vitamins-Minerals (PRESERVISION AREDS 2 PO) Take by mouth in the morning and at bedtime.     Plecanatide (TRULANCE) 3 MG TABS Take 1 tablet by mouth daily. 90 tablet 3   polyethylene glycol (MIRALAX / GLYCOLAX) 17 g packet Take 17 g by mouth daily as needed.     rOPINIRole (REQUIP) 0.25 MG tablet Take 1 tablet in the morning and 2 tablets at bedtime. 270 tablet 1   STIOLTO RESPIMAT 2.5-2.5 MCG/ACT  AERS USE 2 INHALATIONS ORALLY   EVERY MORNING 12 g 3   zinc gluconate 50 MG tablet Take 50 mg by mouth daily.     No current facility-administered medications on file prior to visit.    Allergies  Allergen Reactions   Lovenox [Enoxaparin Sodium] Hives and Rash    PT STATES SHE BROKE OUT IN A RASH HEAD TO TOE AND LASTED ABOUT 3 WEEKS    Metoprolol Succinate [Metoprolol] Other (See Comments)    Muscle aches, hand pain, and tingling    Physical exam:  Today's Vitals   08/15/21 1542  BP: (!) 148/68  Pulse: 72  Weight: 132 lb (59.9 kg)  Height: 5\' 3"  (1.6 m)   Body mass index is 23.38 kg/m.   Wt Readings from Last 3 Encounters:  08/15/21 132 lb (59.9 kg)  08/06/21 135 lb 6.4 oz (61.4 kg)  07/15/21 131 lb 3.2 oz (59.5 kg)     Ht Readings from Last 3 Encounters:  08/15/21 5\' 3"  (1.6 m)  08/06/21 5\' 3"  (1.6 m)  07/15/21 5\' 3"  (1.6 m)      General: The patient is awake, alert and appears not in acute distress. The patient is well groomed. Head: Normocephalic, atraumatic. Neck is supple. Mallampati 1,  neck circumference:13 inches . Nasal airflow  patent.  Retrognathia is not  seen.  Dental status: intact  Cardiovascular:  Regular rate and cardiac rhythm by pulse,  without distended neck veins. Respiratory: Lungs are clear to auscultation.  Skin:  With evidence of ankle edema. . Trunk: The patient's posture is erect.   Neurologic exam : The patient is awake and alert, oriented to place and time.   Memory subjective described as intact.  Attention span & concentration ability appears normal.  Speech is fluent,  without  dysarthria, dysphonia or aphasia.  Mood and affect are appropriate.   Cranial nerves: no loss of smell or taste reported  Pupils are equal and briskly reactive to light. Funduscopic exam deferred.   Extraocular movements in vertical and horizontal planes were intact and without nystagmus. No Diplopia. Visual fields by finger perimetry are intact. Hearing was intact to soft voice and finger rubbing.    Facial sensation intact to fine touch.  Facial motor strength is symmetric and tongue and uvula move midline.  Neck ROM : rotation, tilt and flexion extension were normal for age and shoulder shrug was symmetrical.    Motor exam:  Symmetric bulk, tone and ROM.   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  .   Sensory:  Fine touch, pinprick and vibration were tested and perceived as diminished over the left knee and ankles.  PAD . Decreased sensation in both feet to fine touch.  Proprioception tested in the upper extremities was normal. Carpal tunnel bilaterally.    Coordination: Rapid alternating movements in the fingers/hands were of normal speed.  The Finger-to-nose maneuver was intact without evidence of ataxia  or tremor. There was dysmetria on the left.    Gait and station: Patient could rise unassisted from a seated position, walked with a cane " for balance" as assistive device.  Stance is of normal width/ base .  Toe and  heel walk were deferred.  Deep tendon reflexes: in the  upper and lower extremities are symmetric and intact.  Babinski response was deferred .      After spending a total time of 40 minutes face  to face and additional time for physical and neurologic examination, review of laboratory studies,  personal review of imaging studies, reports and results of other testing and review of referral information / records as far as provided in visit, I have established the following assessment; Holly Benson has a complicated complex form of sleep apnea which is probably partially related at least in her history of congestive heart failure-COPD and also peripheral vascular disease it is feasible that her chemo receptors are not working as well and that hypoxemia which is measured through hypercapnia would not be detected easily.    She presented with a mix of central and obstructive sleep apnea and her baseline sleep study interpreted by Dr. Claiborne Billings, followed by a failure to respond to CPAP and BiPAP as central apneas emerged under treatment.  She was finally placed on ASV and I do not know how well she has responded to this it seems that the in lab titration was successful.  CENTRAL APNEA<  She is here to see me for restless legs but they have actually stopped bothering her at night she is not sure why she wakes up in the middle of the night that may be a myoclonic jerk but she is not aware of any pain she has shortness of breath in daytime not just at night, and she reports the irresistible urge to move whenever she sits still.  Though it is not so much a sleep-related concern as a concern at rest.  During the night her periodic limb movements that were initially found have responded well to being treated with positive airway pressure or ASV.  Based on that, she is willing to see if she could replace the Klonopin which she has taken for over 2 decades at night with another nonaddictive nonhabit-forming  medication.  I would think that the best treatment may either be gabapentin which can also benefit neuropathy or we will try dopaminergic agonists twice a day allowing her to have hopefully better sleep as well as less restless legs in daytime.  Klonopin is of course also an anxiolytic so it will help with some insomnia problems and she may have more insomnia if it is replaced with a different medication.  To wean off gently I will ask her to break the health milligrams in half and take only 1/4 mg  1)  RLS is likely a result of PAD, DDD and neuropathic changes.  The patient has a strong family history of RESTLESS LEGS, both grandmothers had it  and her son has it.   2) Amiodarone induced myopathy, neuropathy.   3) Central apnea on ASV , feels abondoned by DME. Needs a new mask and can't get cardiology to order.   My Plan is to proceed with:  1) weaned off Klonopin gradually - 0.25 mg for the next month. Now 2 times a weak or less. 50 tabs used in 12 months. 2)  neurontin 100 mg 2  at bedtime and 1 in AM.   we can use Requip 1 tab in Am and 2 at night for daytime RLS.   I introduced and fitted a EVORA FFM  S , [employer] [employer]-   I would like to thank [name] Jesse Sans, MD and Michael Boston, Md [address] [place],  [place] [address] for allowing me to meet with and to take care of this pleasant patient.   In short, Holly Benson is presenting with RLS.  I plan to follow up either personally  or through our NP within 6-9  month.  Electronically signed by: Larey Seat, MD 08/15/2021 4:02 PM  [facility] Board certified by The [employer] of Sleep Medicine and Diplomate of the [employer] of Sleep Medicine. Board certified In Neurology through the [place], Fellow of the [employer] of Neurology. Medical Director of [employer].

## 2021-08-16 ENCOUNTER — Other Ambulatory Visit: Payer: Self-pay | Admitting: Neurology

## 2021-08-16 ENCOUNTER — Encounter: Payer: Self-pay | Admitting: Neurology

## 2021-08-16 DIAGNOSIS — G2581 Restless legs syndrome: Secondary | ICD-10-CM

## 2021-08-16 DIAGNOSIS — I480 Paroxysmal atrial fibrillation: Secondary | ICD-10-CM

## 2021-08-16 DIAGNOSIS — G4731 Primary central sleep apnea: Secondary | ICD-10-CM

## 2021-09-02 ENCOUNTER — Other Ambulatory Visit: Payer: Self-pay

## 2021-09-02 ENCOUNTER — Encounter (HOSPITAL_BASED_OUTPATIENT_CLINIC_OR_DEPARTMENT_OTHER): Payer: Self-pay | Admitting: Emergency Medicine

## 2021-09-02 ENCOUNTER — Emergency Department (HOSPITAL_BASED_OUTPATIENT_CLINIC_OR_DEPARTMENT_OTHER): Payer: Medicare Other

## 2021-09-02 ENCOUNTER — Emergency Department (HOSPITAL_BASED_OUTPATIENT_CLINIC_OR_DEPARTMENT_OTHER)
Admission: EM | Admit: 2021-09-02 | Discharge: 2021-09-02 | Disposition: A | Discharge status: Home / Self Care | Payer: Medicare Other | Attending: Emergency Medicine | Admitting: Emergency Medicine

## 2021-09-02 DIAGNOSIS — Z7901 Long term (current) use of anticoagulants: Secondary | ICD-10-CM | POA: Insufficient documentation

## 2021-09-02 DIAGNOSIS — Z79899 Other long term (current) drug therapy: Secondary | ICD-10-CM | POA: Insufficient documentation

## 2021-09-02 DIAGNOSIS — J449 Chronic obstructive pulmonary disease, unspecified: Secondary | ICD-10-CM | POA: Diagnosis not present

## 2021-09-02 DIAGNOSIS — I10 Essential (primary) hypertension: Secondary | ICD-10-CM | POA: Diagnosis not present

## 2021-09-02 DIAGNOSIS — Z87891 Personal history of nicotine dependence: Secondary | ICD-10-CM | POA: Diagnosis not present

## 2021-09-02 DIAGNOSIS — R109 Unspecified abdominal pain: Secondary | ICD-10-CM | POA: Diagnosis not present

## 2021-09-02 DIAGNOSIS — I480 Paroxysmal atrial fibrillation: Secondary | ICD-10-CM | POA: Insufficient documentation

## 2021-09-02 DIAGNOSIS — K439 Ventral hernia without obstruction or gangrene: Secondary | ICD-10-CM | POA: Diagnosis not present

## 2021-09-02 DIAGNOSIS — R1013 Epigastric pain: Secondary | ICD-10-CM | POA: Diagnosis present

## 2021-09-02 LAB — COMPREHENSIVE METABOLIC PANEL
ALT: 14 U/L (ref 0–44)
AST: 16 U/L (ref 15–41)
Albumin: 4 g/dL (ref 3.5–5.0)
Alkaline Phosphatase: 76 U/L (ref 38–126)
Anion gap: 10 (ref 5–15)
BUN: 15 mg/dL (ref 8–23)
CO2: 25 mmol/L (ref 22–32)
Calcium: 8.7 mg/dL — ABNORMAL LOW (ref 8.9–10.3)
Chloride: 99 mmol/L (ref 98–111)
Creatinine, Ser: 0.67 mg/dL (ref 0.44–1.00)
GFR, Estimated: >60 mL/min (ref >60)
Glucose, Bld: 102 mg/dL — ABNORMAL HIGH (ref 70–99)
Potassium: 3.9 mmol/L (ref 3.5–5.1)
Sodium: 134 mmol/L — ABNORMAL LOW (ref 135–145)
Total Bilirubin: 0.5 mg/dL (ref 0.3–1.2)
Total Protein: 7.1 g/dL (ref 6.5–8.1)

## 2021-09-02 LAB — URINALYSIS, ROUTINE W REFLEX MICROSCOPIC
Bilirubin Urine: NEGATIVE
Glucose, UA: NEGATIVE mg/dL
Hgb urine dipstick: NEGATIVE
Ketones, ur: NEGATIVE mg/dL
Nitrite: POSITIVE — AB
Specific Gravity, Urine: 1.022 (ref 1.005–1.030)
pH: 7.5 (ref 5.0–8.0)

## 2021-09-02 LAB — CBC
HCT: 42.5 % (ref 36.0–46.0)
Hemoglobin: 14.4 g/dL (ref 12.0–15.0)
MCH: 32.6 pg (ref 26.0–34.0)
MCHC: 33.9 g/dL (ref 30.0–36.0)
MCV: 96.2 fL (ref 80.0–100.0)
Platelets: 329 10*3/uL (ref 150–400)
RBC: 4.42 MIL/uL (ref 3.87–5.11)
RDW: 14.1 % (ref 11.5–15.5)
WBC: 8.6 10*3/uL (ref 4.0–10.5)
nRBC: 0 % (ref 0.0–0.2)

## 2021-09-02 LAB — LIPASE, BLOOD: Lipase: 18 U/L (ref 11–51)

## 2021-09-02 MED ORDER — SODIUM CHLORIDE 0.9 % IV BOLUS
500.0000 mL | Freq: Once | INTRAVENOUS | Status: AC
  Administered 2021-09-02: 500 mL via INTRAVENOUS

## 2021-09-02 MED ORDER — IOHEXOL 300 MG/ML  SOLN
75.0000 mL | Freq: Once | INTRAMUSCULAR | Status: AC | PRN
  Administered 2021-09-02: 75 mL via INTRAVENOUS

## 2021-09-02 NOTE — ED Notes (Signed)
Patient transported to CT 

## 2021-09-02 NOTE — ED Triage Notes (Signed)
Pt has pain around her hernia since last night. Also reports being constipated.

## 2021-09-02 NOTE — ED Provider Notes (Signed)
[facility] EMERGENCY DEPT Provider Note   CSN: [id number] Arrival date & time: 09/02/21  1221     History Chief Complaint  Patient presents with   Abdominal Pain    Holly Benson is a 87 y.o. female.  She has a history of A. fib and is on anticoagulation.  Known ventral hernia that she says gives her trouble maybe once a month.  Usually improves with bowel movements.  Complaining of increased distention of hernia and pain colicky comes in waves 9 out of 10 at its most severe currently 1 out of 10.  Had a small bowel movement.  Feels very gassy and distended.  No fevers chills nausea vomiting.  Has had this hernia evaluated by general surgery in the past and she was not deemed a good surgical candidate.  No fevers chills chest pain shortness of breath or urinary symptoms.  The history is provided by the patient.  Abdominal Pain Pain location:  Epigastric Pain quality: cramping   Pain radiates to:  Does not radiate Pain severity:  Severe Onset quality:  Gradual Duration:  1 day Timing:  Intermittent Progression:  Unchanged Chronicity:  Recurrent Context: not trauma   Relieved by:  Nothing Worsened by:  Nothing Ineffective treatments:  Bowel activity Associated symptoms: constipation   Associated symptoms: no chest pain, no cough, no diarrhea, no dysuria, no fever, no nausea, no shortness of breath, no sore throat and no vomiting       Past Medical History:  Diagnosis Date   Arrhythmia    Arthritis    Atrial fibrillation (HCC)    Constipation    chronic   COPD (chronic obstructive pulmonary disease) (HCC)    Hyperlipidemia    Hypertension    pulmonary   OSA on CPAP    Osteoporosis    RLS (restless legs syndrome)    Sleep apnea     Patient Active Problem List   Diagnosis Date Noted   Secondary hypercoagulable state ([place]) 05/17/2021   Abnormal CT of the chest 03/15/2021   Pneumonia 01/18/2021   PVD (peripheral vascular disease) ([place]) 08/01/2020    Chronic anticoagulation 08/01/2020   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   Iron deficiency anemia due to chronic blood loss 07/30/2020   RLS (restless legs syndrome) 07/30/2020   Typical atrial flutter ([place]) 01/12/2018   Cardiomyopathy ([place]) 11/10/2017   Shortness of breath 11/10/2017   Heart palpitations 09/10/2017   PAF (paroxysmal atrial fibrillation) ([place]) 09/10/2017   Essential hypertension 09/10/2017   Mixed hyperlipidemia 09/10/2017   COPD (chronic obstructive pulmonary disease) ([place]) 07/21/2017   Cough 07/21/2017    Past Surgical History:  Procedure Laterality Date   ABDOMINAL AORTOGRAM W/LOWER EXTREMITY N/A 03/21/2020   Procedure: ABDOMINAL AORTOGRAM W/ Bilateral LOWER EXTREMITY [place];  Surgeon: Wellington Hampshire, MD;  Location: [facility];  Service: Cardiovascular;  Laterality: N/A;   ABDOMINAL HYSTERECTOMY     arm surgery Right    Broken arm and has a plate in it   CATARACT EXTRACTION Bilateral    COLONOSCOPY     More than 10 years ago In [place] ARTHROSCOPY Right    PERIPHERAL VASCULAR INTERVENTION Bilateral 03/21/2020   Procedure: PERIPHERAL VASCULAR INTERVENTION;  Surgeon: Wellington Hampshire, MD;  Location: [facility];  Service: Cardiovascular;  Laterality: Bilateral;  external iliac     OB History   No obstetric history on file.     Family  History  Problem Relation Age of Onset   Suicidality Father    Stroke Maternal Grandfather    Hypertension Sister    Colon cancer Neg Hx    Esophageal cancer Neg Hx    Pancreatic cancer Neg Hx    Stomach cancer Neg Hx    Liver disease Neg Hx     Social History   Tobacco Use   Smoking status: Former    Packs/day: 1.50    Years: 30.00    Pack years: 45.00    Types: Cigarettes    Start date: 62    Quit date: 11/04/1991    Years since quitting: 29.8   Smokeless tobacco: Never  Vaping Use   Vaping Use: Never used  Substance Use  Topics   Alcohol use: Yes    Alcohol/week: 1.0 standard drink    Types: 1 Glasses of wine per week    Comment: nightly   Drug use: No    Home Medications Prior to Admission medications   Medication Sig Start Date End Date Taking? Authorizing Provider  acetaminophen (TYLENOL) 325 MG tablet Take 650 mg by mouth every 6 (six) hours as needed for moderate pain or headache.    [provider]  albuterol (VENTOLIN HFA) 108 (90 Base) MCG/ACT inhaler Inhale 2 puffs into the lungs every 6 (six) hours as needed for wheezing or shortness of breath. 07/12/20   Collene Gobble, MD  alendronate (FOSAMAX) 70 MG tablet Take 70 mg by mouth once a week. 07/09/21   [provider]  apixaban (ELIQUIS) 2.5 MG TABS tablet Take 1 tablet (2.5 mg total) by mouth 2 (two) times daily. 05/08/21   Minus Breeding, MD  Carboxymethylcellulose Sodium (THERATEARS OP) Place 1 drop into both eyes daily as needed (dry eyes).    [provider]  carvedilol (COREG) 6.25 MG tablet Take 1 tablet (6.25 mg total) by mouth 2 (two) times daily. 07/15/21   Deberah Pelton, NP  cetirizine (ZYRTEC) 10 MG tablet Take 10 mg by mouth daily as needed for allergies.     [provider]  clonazePAM (KLONOPIN) 0.5 MG tablet Take 1 tablet (0.5 mg total) by mouth at bedtime as needed for anxiety. 08/15/21   Dohmeier, Asencion Partridge, MD  Coenzyme Q10 (COQ-10) 100 MG CAPS Take 100 mg by mouth daily.    [provider]  diltiazem (CARDIZEM) 30 MG tablet Take 1 tablet every 4 hours AS NEEDED for heart rate >100 05/17/21   Fenton, Clint R, PA  gabapentin (NEURONTIN) 100 MG capsule Take 1 capsule by mouth in the morning and 2 capsules in the evening.    [provider]  hydrochlorothiazide (HYDRODIURIL) 12.5 MG tablet Take 1 tablet (12.5 mg total) by mouth daily. 08/06/21   Duke, Tami Lin, PA  irbesartan (AVAPRO) 75 MG tablet TAKE ONE TABLET BY MOUTH DAILY 10/18/20   Hilty, Nadean Corwin, MD  Misc Natural Products  (COLON CLEANSE) CAPS Take 4 capsules by mouth daily as needed (constipation).    [provider]  Multiple Vitamins-Minerals (PRESERVISION AREDS 2 PO) Take by mouth in the morning and at bedtime.    [provider]  Plecanatide (TRULANCE) 3 MG TABS Take 1 tablet by mouth daily. 04/24/21   Esterwood, Amy S, PA-C  polyethylene glycol (MIRALAX / GLYCOLAX) 17 g packet Take 17 g by mouth daily as needed.    [provider]  rOPINIRole (REQUIP) 0.25 MG tablet Take 1 tablet in the morning and 2 tablets at  bedtime. 08/15/21   Dohmeier, Asencion Partridge, MD  STIOLTO RESPIMAT 2.5-2.5 MCG/ACT AERS USE 2 INHALATIONS ORALLY   EVERY MORNING 08/23/20   Collene Gobble, MD  zinc gluconate 50 MG tablet Take 50 mg by mouth daily.    [provider]    Allergies    Lovenox [enoxaparin sodium] and Metoprolol succinate [metoprolol]  Review of Systems   Review of Systems  Constitutional:  Negative for fever.  HENT:  Negative for sore throat.   Eyes:  Negative for visual disturbance.  Respiratory:  Negative for cough and shortness of breath.   Cardiovascular:  Negative for chest pain.  Gastrointestinal:  Positive for abdominal pain and constipation. Negative for diarrhea, nausea and vomiting.  Genitourinary:  Negative for dysuria.  Musculoskeletal:  Negative for neck pain.  Skin:  Negative for rash.  Neurological:  Negative for headaches.   Physical Exam Updated Vital Signs BP (!) 166/99 (BP Location: Right Arm)   Pulse (!) 108   Temp 98.2 F (36.8 C) (Oral)   Resp 20   Ht 5\' 3"  (1.6 m)   Wt 59 kg   SpO2 96%   BMI 23.03 kg/m   Physical Exam Vitals and nursing note reviewed.  Constitutional:      General: She is not in acute distress.    Appearance: Normal appearance. She is well-developed.  HENT:     Head: Normocephalic and atraumatic.  Eyes:     Conjunctiva/sclera: Conjunctivae normal.  Cardiovascular:     Rate and Rhythm: Normal rate and regular rhythm.     Heart  sounds: No murmur heard. Pulmonary:     Effort: Pulmonary effort is normal. No respiratory distress.     Breath sounds: Normal breath sounds.  Abdominal:     Palpations: Abdomen is soft.     Tenderness: There is abdominal tenderness in the epigastric area.     Hernia: A hernia is present. Hernia is present in the ventral area.     Comments: She has a tender ventral hernia that is about 6 cm across.  It is soft without any overlying color changes.  There are some mild to moderate tenderness with palpation.  Musculoskeletal:        General: Normal range of motion.     Cervical back: Neck supple.     Right lower leg: No edema.     Left lower leg: No edema.  Skin:    General: Skin is warm and dry.  Neurological:     General: No focal deficit present.     Mental Status: She is alert.    ED Results / Procedures / Treatments   Labs (all labs ordered are listed, but only abnormal results are displayed) Labs Reviewed  COMPREHENSIVE METABOLIC PANEL - Abnormal; Notable for the following components:      Result Value   Sodium 134 (*)    Glucose, Bld 102 (*)    Calcium 8.7 (*)    All other components within normal limits  URINALYSIS, ROUTINE W REFLEX MICROSCOPIC - Abnormal; Notable for the following components:   APPearance HAZY (*)    Protein, ur TRACE (*)    Nitrite POSITIVE (*)    Leukocytes,Ua SMALL (*)    Bacteria, UA MANY (*)    Non Squamous Epithelial 0-5 (*)    All other components within normal limits  URINE CULTURE  LIPASE, BLOOD  CBC    EKG None  Radiology CT Abdomen Pelvis W Contrast  Result Date: 09/02/2021 CLINICAL DATA:  Acute abdominal pain. EXAM: CT ABDOMEN AND PELVIS WITH CONTRAST TECHNIQUE: Multidetector CT imaging of the abdomen and pelvis was performed using the standard protocol following bolus administration of intravenous contrast. CONTRAST:  68mL OMNIPAQUE IOHEXOL 300 MG/ML  SOLN COMPARISON:  January 04, 2021. FINDINGS: Lower chest: No acute abnormality.  Hepatobiliary: No focal liver abnormality is seen. No gallstones, gallbladder wall thickening, or biliary dilatation. Pancreas: Unremarkable. No pancreatic ductal dilatation or surrounding inflammatory changes. Spleen: Normal in size without focal abnormality. Adrenals/Urinary Tract: Adrenal glands are unremarkable. Kidneys are normal, without renal calculi, focal lesion, or hydronephrosis. Bladder is unremarkable. Stomach/Bowel: Stomach appears normal. There is again noted a moderate size ventral hernia in the right upper quadrant which appears to contain a portion of small bowel, but does not result in definite bowel obstruction. The colon is dilated and fluid-filled. No definite small bowel dilatation is noted. Vascular/Lymphatic: Aortic atherosclerosis. No enlarged abdominal or pelvic lymph nodes. Reproductive: Status post hysterectomy. No adnexal masses. Other: No abdominal wall hernia or abnormality. No abdominopelvic ascites. Musculoskeletal: No acute or significant osseous findings. IMPRESSION: There is again noted a moderate size ventral hernia in the right upper quadrant which appears to contain a loop of small bowel, but does not result in small bowel obstruction. The colon is dilated and fluid-filled suggesting possible ileus. Stool is noted in the sigmoid colon and rectum. Electronically Signed   By: Marijo Conception M.D.   On: 09/02/2021 17:43    Procedures Procedures   Medications Ordered in ED Medications  sodium chloride 0.9 % bolus 500 mL (0 mLs Intravenous Stopped 09/02/21 1754)  iohexol (OMNIPAQUE) 300 MG/ML solution 75 mL (75 mLs Intravenous Contrast Given 09/02/21 1708)    ED Course  I have reviewed the triage vital signs and the nursing notes.  Pertinent labs & imaging results that were available during my care of the patient were reviewed by me and considered in my medical decision making (see chart for details).  Clinical Course as of 09/03/21 1000  Mon Sep 02, 2021  1807  Patient's hernia is soft and nontender now.  Able to be easily reduced.  CT shows the hernia but not any evidence of obstruction. [MB]  4970 She is comfortable plan for monitoring this and following up with her treating providers.  Her urinalysis showed possible signs of infection nitrite positive but she is not having any symptoms.  This has been sent for culture and she understands that we may be reaching out to her if culture is positive and needs to get started on antibiotics. [MB]    Clinical Course User Index [MB] Hayden Rasmussen, MD   MDM Rules/Calculators/A&P                          This patient complains of abdominal pain, tender hernia; this involves an extensive number of treatment Options and is a complaint that carries with it a high risk of complications and Morbidity. The differential includes incarcerated hernia, symptomatic hernia, obstruction, gangrene, sepsis  I ordered, reviewed and interpreted labs, which included CBC with normal white count normal hemoglobin, chemistries and LFTs fairly normal, urinalysis possible signs of infection although no symptoms I ordered medication IV fluids I ordered imaging studies which included CT abdomen and pelvis and I independently    visualized and interpreted imaging which showed ventral hernia without signs of obstruction Additional history obtained from patient's daughter Previous records obtained and reviewed in epic no recent  admissions  After the interventions stated above, I reevaluated the patient and found patient have benign exam.  She is feeling clinically better and unable to reduce the hernia.  Reviewed current work-up with her and she is comfortable plan for discharge and outpatient follow-up with her providers.  Return instructions discussed   Final Clinical Impression(s) / ED Diagnoses Final diagnoses:  Ventral hernia without obstruction or gangrene    Rx / DC Orders ED Discharge Orders     None         Hayden Rasmussen, MD 09/03/21 1002

## 2021-09-02 NOTE — Discharge Instructions (Signed)
You were seen in the emergency department for a tender ventral hernia.  You had lab work urinalysis and a CAT scan that did demonstrate the hernia but no signs of obstruction.  Your hernia was soft and able to be reduced.  Your urine showed possible signs of infection was sent for culture.  [facility] will contact you if you need to get started on antibiotics.  Please follow-up as scheduled with your primary care doctor.  Return to the emergency department if any worsening or concerning symptoms

## 2021-09-04 ENCOUNTER — Telehealth: Payer: Self-pay | Admitting: Neurology

## 2021-09-04 LAB — URINE CULTURE: Culture: >=100000 — AB

## 2021-09-04 NOTE — Telephone Encounter (Signed)
Pt was scheduled for Initial CPAP visit on 01/13/22. Pt was informed to bring machine and power cord to appt.  DME: [facility] on 09/04/21 Phone: [phone] Fax: [phone] Equipment issued: Resmed S10 ASV Pt to be schedule between: 11/04/21-02/02/22

## 2021-09-04 NOTE — Telephone Encounter (Signed)
LVM to contact [facility].

## 2021-09-05 ENCOUNTER — Ambulatory Visit: Payer: Medicare Other | Admitting: Physician Assistant

## 2021-09-05 ENCOUNTER — Telehealth: Payer: Self-pay | Admitting: *Deleted

## 2021-09-05 DIAGNOSIS — N390 Urinary tract infection, site not specified: Secondary | ICD-10-CM | POA: Diagnosis not present

## 2021-09-05 NOTE — Telephone Encounter (Signed)
Post ED Visit - Positive Culture Follow-up  Culture report reviewed by antimicrobial stewardship pharmacist: [facility] Team []  Elenor Quinones, Pharm.D. []  Heide Guile, Pharm.D., BCPS AQ-ID []  Parks Neptune, Pharm.D., BCPS []  Alycia Rossetti, Pharm.D., BCPS []  [place], Pharm.D., BCPS, AAHIVP []  Legrand Como, Pharm.D., BCPS, AAHIVP []  Salome Arnt, PharmD, BCPS []  Johnnette Gourd, PharmD, BCPS []  Hughes Better, PharmD, BCPS []  Leeroy Cha, PharmD []  Laqueta Linden, PharmD, BCPS []  Albertina Parr, PharmD  [facility] Team []  Leodis Sias, PharmD []  Lindell Spar, PharmD []  Royetta Asal, PharmD []  Graylin Shiver, Rph []  Rema Fendt) Glennon Mac, PharmD []  Arlyn Dunning, PharmD []  Netta Cedars, PharmD []  Dia Sitter, PharmD []  Leone Haven, PharmD []  Gretta Arab, PharmD []  Theodis Shove, PharmD []  Peggyann Juba, PharmD []  Reuel Boom, PharmD   Positive urine culture No UTI symptoms and no further patient follow-up is required at this time.  Margarita Mail, PA  Harlon Flor Jasper 09/05/2021, 7:53 AM

## 2021-09-09 DIAGNOSIS — H524 Presbyopia: Secondary | ICD-10-CM | POA: Diagnosis not present

## 2021-09-09 DIAGNOSIS — H353132 Nonexudative age-related macular degeneration, bilateral, intermediate dry stage: Secondary | ICD-10-CM | POA: Diagnosis not present

## 2021-09-12 DIAGNOSIS — I429 Cardiomyopathy, unspecified: Secondary | ICD-10-CM | POA: Diagnosis not present

## 2021-09-12 DIAGNOSIS — E785 Hyperlipidemia, unspecified: Secondary | ICD-10-CM | POA: Diagnosis not present

## 2021-09-12 DIAGNOSIS — M810 Age-related osteoporosis without current pathological fracture: Secondary | ICD-10-CM | POA: Diagnosis not present

## 2021-09-12 LAB — CBC AND DIFFERENTIAL
HCT: 41 (ref 36–46)
Hemoglobin: 14.9 (ref 12.0–16.0)
Neutrophils Absolute: 3.7
Platelets: 294 10*3/uL (ref 150–400)
WBC: 6.3

## 2021-09-12 LAB — LIPID PANEL
Cholesterol: 201 — AB (ref 0–200)
HDL: 61 (ref 35–70)
LDL Cholesterol: 126

## 2021-09-12 LAB — CBC: RBC: 4.3 (ref 3.87–5.11)

## 2021-09-16 ENCOUNTER — Other Ambulatory Visit: Payer: Self-pay | Admitting: Cardiovascular Disease

## 2021-09-19 DIAGNOSIS — Z87891 Personal history of nicotine dependence: Secondary | ICD-10-CM | POA: Diagnosis not present

## 2021-09-19 DIAGNOSIS — Z1339 Encounter for screening examination for other mental health and behavioral disorders: Secondary | ICD-10-CM | POA: Diagnosis not present

## 2021-09-19 DIAGNOSIS — I2729 Other secondary pulmonary hypertension: Secondary | ICD-10-CM | POA: Diagnosis not present

## 2021-09-19 DIAGNOSIS — Z0000 Encounter for general adult medical examination without abnormal findings: Secondary | ICD-10-CM | POA: Diagnosis not present

## 2021-09-19 DIAGNOSIS — I1310 Hypertensive heart and chronic kidney disease without heart failure, with stage 1 through stage 4 chronic kidney disease, or unspecified chronic kidney disease: Secondary | ICD-10-CM | POA: Diagnosis not present

## 2021-09-19 DIAGNOSIS — G720 Drug-induced myopathy: Secondary | ICD-10-CM | POA: Diagnosis not present

## 2021-09-19 DIAGNOSIS — E785 Hyperlipidemia, unspecified: Secondary | ICD-10-CM | POA: Diagnosis not present

## 2021-09-19 DIAGNOSIS — M810 Age-related osteoporosis without current pathological fracture: Secondary | ICD-10-CM | POA: Diagnosis not present

## 2021-09-19 DIAGNOSIS — J449 Chronic obstructive pulmonary disease, unspecified: Secondary | ICD-10-CM | POA: Diagnosis not present

## 2021-09-19 DIAGNOSIS — D6869 Other thrombophilia: Secondary | ICD-10-CM | POA: Diagnosis not present

## 2021-09-19 DIAGNOSIS — I480 Paroxysmal atrial fibrillation: Secondary | ICD-10-CM | POA: Diagnosis not present

## 2021-09-19 DIAGNOSIS — I429 Cardiomyopathy, unspecified: Secondary | ICD-10-CM | POA: Diagnosis not present

## 2021-09-19 DIAGNOSIS — I739 Peripheral vascular disease, unspecified: Secondary | ICD-10-CM | POA: Diagnosis not present

## 2021-09-19 DIAGNOSIS — Z1331 Encounter for screening for depression: Secondary | ICD-10-CM | POA: Diagnosis not present

## 2021-10-07 NOTE — Progress Notes (Signed)
Cardiology Clinic Note   Patient Name: Holly Benson Date of Encounter: 10/08/2021  Primary Care Provider:  Michael Boston, MD Primary Cardiologist:  Pixie Casino, MD  Patient Profile    Holly Benson 87 year old female presents the clinic today for follow-up evaluation of her paroxysmal atrial fibrillation and essential hypertension.  Past Medical History    Past Medical History:  Diagnosis Date   Arrhythmia    Arthritis    Atrial fibrillation (HCC)    Constipation    chronic   COPD (chronic obstructive pulmonary disease) (HCC)    Hyperlipidemia    Hypertension    pulmonary   OSA on CPAP    Osteoporosis    RLS (restless legs syndrome)    Sleep apnea    Past Surgical History:  Procedure Laterality Date   ABDOMINAL AORTOGRAM W/LOWER EXTREMITY N/A 03/21/2020   Procedure: ABDOMINAL AORTOGRAM W/ Bilateral LOWER EXTREMITY [place];  Surgeon: Wellington Hampshire, MD;  Location: [facility];  Service: Cardiovascular;  Laterality: N/A;   ABDOMINAL HYSTERECTOMY     arm surgery Right    Broken arm and has a plate in it   CATARACT EXTRACTION Bilateral    COLONOSCOPY     More than 10 years ago In [place] ARTHROSCOPY Right    PERIPHERAL VASCULAR INTERVENTION Bilateral 03/21/2020   Procedure: PERIPHERAL VASCULAR INTERVENTION;  Surgeon: Wellington Hampshire, MD;  Location: [facility];  Service: Cardiovascular;  Laterality: Bilateral;  external iliac    Allergies  Allergies  Allergen Reactions   Lovenox [Enoxaparin Sodium] Hives and Rash    PT STATES SHE BROKE OUT IN A RASH HEAD TO TOE AND LASTED ABOUT 3 WEEKS    Metoprolol Succinate [Metoprolol] Other (See Comments)    Muscle aches, hand pain, and tingling    History of Present Illness    Holly Benson has a PMH of hypertension, atrial flutter, hyperlipidemia, obstructive sleep apnea, COPD, peripheral arterial disease, and atrial fibrillation.  CHA2DS2-VASc score 5 on Eliquis.   She was  seen by  Malka So, PA-C on 05/17/2021.  Of note she was previously on amiodarone which was discontinued due to lung toxicity.  When she was seen in the atrial fibrillation clinic she described a frequent fluttering type sensation which would last for 10 to 15 minutes and happen multiple times per day.  She was started on diltiazem 30 mg daily.   She contacted the nurse triage line 07/11/2021.  Her metoprolol was transitioned to carvedilol.  With metoprolol she did notice tingling and numbness with an icy type pain in her thumb and first 2 fingers on each hand.   She presented to the clinic 07/15/2021 for follow-up evaluation stated she was out of town for several days and noticed that her blood pressure was low.  She reported that she stopped taking her afternoon dose of hydralazine and did not notice much change with her blood pressure.  She discontinued her morning dose of hydralazine as well.  She noticed a pain in her hands and tingling with metoprolol.  She also noticed some occasional ankle edema.  I changed her metoprolol to carvedilol, gave her the [employer] support stocking sheet, discontinued her hydralazine, gave her the salty 6 diet sheet, asked her to maintain her physical activity, and planned follow-up in 1 to 2 months.  Have asked her to maintain a blood pressure log.   She reported that she contacted the office earlier last week for follow-up  appointment.  She was told that she would not be scheduled for an appointment with Dr. Debara Pickett for several months.  She contacted the patient advocate representatives with her complaint.  She was added onto my schedule today.  She followed up with [name] PA-C on 08/06/2021.  She had contacted the office with reports of increased palpitations.  She questioned if she was having more episodes of atrial fibrillation.  She presented to the clinic alone.  She reported that she did not want to distinguish between PACs and paroxysmal atrial fibrillation.  She noted  palpitations at least once daily that would last for around 30 minutes.  She denied dizziness, presyncope, syncope with the episodes.  She had not taken her as needed Cardizem.  She was maintaining a blood pressure log.  Her systolic blood pressures were in the 80- 100s.  She denied bleeding issues.  She was tolerating carvedilol well.  She presents to the clinic today for follow-up evaluation states over the last week she has noticed increased shortness of breath and had has been taking her diltiazem more regularly due to increased heart rate.  Her blood pressures have been slightly elevated as well.  Today in the clinic her heart rate is 88 with a blood pressure of 116/60.  She reports that she has had some increased phlegm and has just returned from seeing her family.  She had a call from her pharmacist who lowered her apixaban dosing due to her weight.  She has now regained weight.  I will increase her apixaban dosing back to 5 mg twice daily.  I will order an echocardiogram, CBC, and schedule follow-up for February.   Today she denies chest pain, lower extremity edema,  melena, hematuria, hemoptysis, diaphoresis, weakness, presyncope, syncope, orthopnea, and PND.  Home Medications    Prior to Admission medications   Medication Sig Start Date End Date Taking? Authorizing Provider  acetaminophen (TYLENOL) 325 MG tablet Take 650 mg by mouth every 6 (six) hours as needed for moderate pain or headache.    [provider]  albuterol (VENTOLIN HFA) 108 (90 Base) MCG/ACT inhaler Inhale 2 puffs into the lungs every 6 (six) hours as needed for wheezing or shortness of breath. 07/12/20   Collene Gobble, MD  alendronate (FOSAMAX) 70 MG tablet Take 70 mg by mouth once a week. 07/09/21   [provider]  apixaban (ELIQUIS) 2.5 MG TABS tablet Take 1 tablet (2.5 mg total) by mouth 2 (two) times daily. 05/08/21   Minus Breeding, MD  Carboxymethylcellulose Sodium (THERATEARS OP) Place 1 drop into both  eyes daily as needed (dry eyes).    [provider]  carvedilol (COREG) 6.25 MG tablet Take 1 tablet (6.25 mg total) by mouth 2 (two) times daily. 07/15/21   Deberah Pelton, NP  cetirizine (ZYRTEC) 10 MG tablet Take 10 mg by mouth daily as needed for allergies.     [provider]  clonazePAM (KLONOPIN) 0.5 MG tablet Take 1 tablet (0.5 mg total) by mouth at bedtime as needed for anxiety. 08/15/21   Dohmeier, Asencion Partridge, MD  Coenzyme Q10 (COQ-10) 100 MG CAPS Take 100 mg by mouth daily.    [provider]  diltiazem (CARDIZEM) 30 MG tablet Take 1 tablet every 4 hours AS NEEDED for heart rate >100 05/17/21   Fenton, Clint R, PA  gabapentin (NEURONTIN) 100 MG capsule Take 1 capsule by mouth in the morning and 2 capsules in the evening.    [provider]  hydrochlorothiazide (HYDRODIURIL) 12.5 MG tablet Take 1 tablet (12.5 mg total) by mouth daily. 08/06/21   Duke, Tami Lin, PA  irbesartan (AVAPRO) 75 MG tablet TAKE ONE TABLET BY MOUTH DAILY 10/18/20   Hilty, Nadean Corwin, MD  Misc Natural Products (COLON CLEANSE) CAPS Take 4 capsules by mouth daily as needed (constipation).    [provider]  Multiple Vitamins-Minerals (PRESERVISION AREDS 2 PO) Take by mouth in the morning and at bedtime.    [provider]  Plecanatide (TRULANCE) 3 MG TABS Take 1 tablet by mouth daily. 04/24/21   Esterwood, Amy S, PA-C  polyethylene glycol (MIRALAX / GLYCOLAX) 17 g packet Take 17 g by mouth daily as needed.    [provider]  rOPINIRole (REQUIP) 0.25 MG tablet Take 1 tablet in the morning and 2 tablets at bedtime. 08/15/21   Dohmeier, Asencion Partridge, MD  STIOLTO RESPIMAT 2.5-2.5 MCG/ACT AERS USE 2 INHALATIONS ORALLY   EVERY MORNING 08/23/20   Collene Gobble, MD  zinc gluconate 50 MG tablet Take 50 mg by mouth daily.    [provider]    Family History    Family History  Problem Relation Age of Onset   Suicidality Father    Stroke Maternal  Grandfather    Hypertension Sister    Colon cancer Neg Hx    Esophageal cancer Neg Hx    Pancreatic cancer Neg Hx    Stomach cancer Neg Hx    Liver disease Neg Hx    She indicated that her mother is deceased. She indicated that her father is deceased. She indicated that her sister is alive. She indicated that her maternal grandfather is deceased. She indicated that the status of her neg hx is unknown.  Social History    Social History   Socioeconomic History   Marital status: Widowed    Spouse name: Not on file   Number of children: 3   Years of education: Not on file   Highest education level: Not on file  Occupational History   Occupation: retired    Comment: retired Licensed conveyancer  Tobacco Use   Smoking status: Former    Packs/day: 1.50    Years: 30.00    Pack years: 45.00    Types: Cigarettes    Start date: 1956    Quit date: 11/04/1991    Years since quitting: 29.9   Smokeless tobacco: Never  Vaping Use   Vaping Use: Never used  Substance and Sexual Activity   Alcohol use: Yes    Alcohol/week: 1.0 standard drink    Types: 1 Glasses of wine per week    Comment: nightly   Drug use: No   Sexual activity: Not Currently  Other Topics Concern   Not on file  Social History Narrative   Lives with daughter   Social Determinants of Health   Financial Resource Strain: Not on file  Food Insecurity: Not on file  Transportation Needs: Not on file  Physical Activity: Not on file  Stress: Not on file  Social Connections: Not on file  Intimate Partner Violence: Not on file     Review of Systems    General:  No chills, fever, night sweats or weight changes.  Cardiovascular:  No chest pain, dyspnea on exertion, edema, orthopnea, palpitations, paroxysmal nocturnal dyspnea. Dermatological: No rash, lesions/masses Respiratory: No cough, dyspnea Urologic: No hematuria, dysuria Abdominal:   No nausea, vomiting, diarrhea, bright red blood per rectum, melena, or  hematemesis Neurologic:  No visual  changes, wkns, changes in mental status. All other systems reviewed and are otherwise negative except as noted above.  Physical Exam    VS:  BP 116/60   Pulse 88   Ht 5\' 2"  (1.575 m)   Wt 140 lb (63.5 kg)   SpO2 97%   BMI 25.61 kg/m  , BMI Body mass index is 25.61 kg/m. GEN: Well nourished, well developed, in no acute distress. HEENT: normal. Neck: Supple, no JVD, carotid bruits, or masses. Cardiac: RRR, no murmurs, rubs, or gallops. No clubbing, cyanosis, edema.  Radials/DP/PT 2+ and equal bilaterally.  Respiratory:  Respirations regular and unlabored, clear to auscultation bilaterally. GI: Soft, nontender, nondistended, BS + x 4. MS: no deformity or atrophy. Skin: warm and dry, no rash. Neuro:  Strength and sensation are intact. Psych: Normal affect.  Accessory Clinical Findings    Recent Labs: 09/02/2021: ALT 14; BUN 15; Creatinine, Ser 0.67; Hemoglobin 14.4; Platelets 329; Potassium 3.9; Sodium 134   Recent Lipid Panel    Component Value Date/Time   CHOL 149 04/11/2020 0838   TRIG 60 04/11/2020 0838   HDL 69 04/11/2020 0838   CHOLHDL 2.2 04/11/2020 0838   LDLCALC 68 04/11/2020 0838    ECG personally reviewed by me today-atrial fibrillation/flutter with variable AV block 92 bpm- No acute changes  EKG 08/06/2021-normal sinus rhythm 62 bpm  Echocardiogram 09/26/2020 IMPRESSIONS     1. Left ventricular ejection fraction, by estimation, is 60 to 65%. The  left ventricle has normal function. The left ventricle has no regional  wall motion abnormalities. Left ventricular diastolic parameters are  indeterminate.   2. Right ventricular systolic function is normal. The right ventricular  size is normal. There is moderately elevated pulmonary artery systolic  pressure. The estimated right ventricular systolic pressure is 60.7 mmHg.   3. Left atrial size was severely dilated.   4. Right atrial size was severely dilated.   5. The  mitral valve is normal in structure. Trivial mitral valve  regurgitation. No evidence of mitral stenosis.   6. Tricuspid valve regurgitation is moderate.   7. The aortic valve is tricuspid. Aortic valve regurgitation is trivial.  Mild to moderate aortic valve sclerosis/calcification is present, without  any evidence of aortic stenosis.   8. The inferior vena cava is normal in size with greater than 50%  respiratory variability, suggesting right atrial pressure of 3 mmHg.   FINDINGS   Left Ventricle: Left ventricular ejection fraction, by estimation, is 60  to 65%. The left ventricle has normal function. The left ventricle has no  regional wall motion abnormalities. The left ventricular internal cavity  size was normal in size. There is   no left ventricular hypertrophy. Left ventricular diastolic parameters  are indeterminate.   Right Ventricle: The right ventricular size is normal. Right vetricular  wall thickness was not assessed. Right ventricular systolic function is  normal. There is moderately elevated pulmonary artery systolic pressure.  The tricuspid regurgitant velocity is   3.38 m/s, and with an assumed right atrial pressure of 3 mmHg, the  estimated right ventricular systolic pressure is 37.1 mmHg.   Left Atrium: Left atrial size was severely dilated.   Right Atrium: Right atrial size was severely dilated.   Pericardium: There is no evidence of pericardial effusion.   Mitral Valve: The mitral valve is normal in structure. Trivial mitral  valve regurgitation. No evidence of mitral valve stenosis.   Tricuspid Valve: The tricuspid valve is normal in structure. Tricuspid  valve regurgitation  is moderate.   Aortic Valve: The aortic valve is tricuspid. Aortic valve regurgitation is  trivial. Aortic regurgitation PHT measures 663 msec. Mild to moderate  aortic valve sclerosis/calcification is present, without any evidence of  aortic stenosis.   Pulmonic Valve: The  pulmonic valve was not well visualized. Pulmonic valve  regurgitation is not visualized.   Aorta: The aortic root and ascending aorta are structurally normal, with  no evidence of dilitation.   Venous: The inferior vena cava is normal in size with greater than 50%  respiratory variability, suggesting right atrial pressure of 3 mmHg.   IAS/Shunts: No atrial level shunt detected by color flow Doppler.   Cardiac event monitor 05/22/2021 Max 124 bpm 09:07am, 07/16 Min 50 bpm 06:48am, 07/17 Avg 71 bpm 16.8% supraventricular ectopy <1% ventricular ectopy No atrial fibrillation noted Predominant rhythm was sinus rhythm Symptoms associated with sinus rhythm with PACs and atrial runs   Assessment & Plan   1.  Essential hypertension-BP today 116/60.  She has been tolerating carvedilol well.  Blood pressures well controlled at home. Continue carvedilol, hydrochlorothiazide, Avapro Heart healthy low-sodium diet-salty 6 given Increase physical activity as tolerated  Paroxysmal atrial fibrillation-has noted more frequent episodes of palpitations.  Has not taken as needed diltiazem.  CHA2DS2-VASc score 4.  Reports compliance with apixaban and denies bleeding issues. Start diltiazem LA 120 daily Increase apixaban to 5 mg twice daily Discontinue diltiazem 30 mg as needed Heart healthy low-sodium diet-salty 6 given Increase physical activity as tolerated Avoid triggers caffeine, chocolate, EtOH, dehydration etc. Order CBC  Obstructive sleep apnea-continues compliance with CPAP.  Waking up well rested. Continue CPAP use   Disposition: Follow-up with Dr. Debara Pickett or me February and A. fib clinic in the next 1-2 months.  Jossie Ng. Arvle Grabe NP-C    10/08/2021, 11:13 AM [address] Office [phone] Fax [phone]  Notice: This dictation was prepared with Dragon dictation along with smaller phrase technology. Any transcriptional errors  that result from this process are unintentional and may not be corrected upon review.  I spent 15 minutes examining this patient, reviewing medications, and using patient centered shared decision making involving her cardiac care.  Prior to her visit I spent greater than 20 minutes reviewing her past medical history,  medications, and prior cardiac tests.

## 2021-10-08 ENCOUNTER — Other Ambulatory Visit: Payer: Self-pay

## 2021-10-08 ENCOUNTER — Encounter: Payer: Self-pay | Admitting: General Practice

## 2021-10-08 ENCOUNTER — Telehealth (HOSPITAL_COMMUNITY): Payer: Self-pay | Admitting: Physician Assistant

## 2021-10-08 ENCOUNTER — Ambulatory Visit (INDEPENDENT_AMBULATORY_CARE_PROVIDER_SITE_OTHER): Payer: Medicare Other | Admitting: General Practice

## 2021-10-08 VITALS — BP 116/60 | HR 88 | Ht 62.0 in | Wt 140.0 lb

## 2021-10-08 DIAGNOSIS — R0609 Other forms of dyspnea: Secondary | ICD-10-CM | POA: Diagnosis not present

## 2021-10-08 DIAGNOSIS — Z9989 Dependence on other enabling machines and devices: Secondary | ICD-10-CM | POA: Diagnosis not present

## 2021-10-08 DIAGNOSIS — I480 Paroxysmal atrial fibrillation: Secondary | ICD-10-CM

## 2021-10-08 DIAGNOSIS — G4733 Obstructive sleep apnea (adult) (pediatric): Secondary | ICD-10-CM | POA: Diagnosis not present

## 2021-10-08 DIAGNOSIS — Z79899 Other long term (current) drug therapy: Secondary | ICD-10-CM

## 2021-10-08 DIAGNOSIS — I10 Essential (primary) hypertension: Secondary | ICD-10-CM | POA: Diagnosis not present

## 2021-10-08 LAB — CBC
Hematocrit: 39.1 % (ref 34.0–46.6)
Hemoglobin: 13.2 g/dL (ref 11.1–15.9)
MCH: 33.1 pg — ABNORMAL HIGH (ref 26.6–33.0)
MCHC: 33.8 g/dL (ref 31.5–35.7)
MCV: 98 fL — ABNORMAL HIGH (ref 79–97)
Platelets: 288 10*3/uL (ref 150–450)
RBC: 3.99 x10E6/uL (ref 3.77–5.28)
RDW: 13.1 % (ref 11.7–15.4)
WBC: 6.7 10*3/uL (ref 3.4–10.8)

## 2021-10-08 MED ORDER — DILTIAZEM HCL ER COATED BEADS 120 MG PO CP24: 120.0000 mg | ORAL_CAPSULE | Freq: Every day | ORAL | 6 refills | Status: DC

## 2021-10-08 MED ORDER — APIXABAN 5 MG PO TABS: 5.0000 mg | ORAL_TABLET | Freq: Two times a day (BID) | ORAL | 6 refills | Status: DC

## 2021-10-08 NOTE — Telephone Encounter (Signed)
Called received from [facility] [place] with AHF Clinic that Dr. Lysbeth Penner office had left VM on her Triage VM stating pt needed appt with AFib Clinic this week, so Jasmine was calling to advise me.  Called and left message for patient to call Afib Clinic, however, saw pt already has appt in place on 10/10/21 with [name], PA-C in EP area at [facility].  When pt calls [place] back, I will let patient know she is already scheduled for the appt 10/10/21 and will make her an appt with [name], PA-C in 1-2 months.

## 2021-10-08 NOTE — Patient Instructions (Addendum)
Medication Instructions:  INCREASE APIXABAN 5MG  DAILY-YOU CAN TAKE 2 OF WHAT YOU HAVE UNTIL THEY ARE GONE.  START DILTIAZEM 120MG  DAILY  *If you need a refill on your cardiac medications before your next appointment, please call your pharmacy*  Lab Work: CBC TODAY If you have labs (blood work) drawn today and your tests are completely normal, you will receive your results only by:  [place] (if you have MyChart) OR A paper copy in the mail.  If you have any lab test that is abnormal or we need to change your treatment, we will call you to review the results. You may go to any [facility] that is convenient for you however, we do have a lab in our office that is able to assist you. You DO NOT need an appointment for our lab. The lab is open 8:00am and closes at 4:00pm. Lunch 12:45 - 1:45pm.  Testing/Procedures: Echocardiogram - Your physician has requested that you have an echocardiogram. Echocardiography is a painless test that uses sound waves to create images of your heart. It provides your doctor with information about the size and shape of your heart and how well your heart's chambers and valves are working. This procedure takes approximately one hour. There are no restrictions for this procedure. This will be performed at either our [facility] location - [address] [place] location [facility] 2nd floor.  Follow-Up: 1-Your next appointment:  2-9- at 11:20am  In Person with [name], FNP    AND  2-THIS [place], you and your health needs are our priority.  As part of our continuing mission to provide you with exceptional heart care, we have created designated Provider Care Teams.  These Care Teams include your primary Cardiologist (physician) and Advanced Practice Providers (APPs -  Physician Assistants and Nurse Practitioners) who all work together to provide you with the care you need, when you need  it.

## 2021-10-09 NOTE — Progress Notes (Signed)
PCP:  Kathelyn Gombos Boston, MD Primary Cardiologist: Pixie Casino, MD Electrophysiologist: Constance Haw, MD   Holly Benson is a 87 y.o. female seen today for [name], MD for routine electrophysiology followup.  Since last being seen in our clinic the patient reports doing OK overall, though she has in the past 10 days notice more SOB and fatigue, likely associated with her being in AF.  The day after thanksgiving, she took a mile hike on dirt road with her family, part of it up hill without any difficulty.  Now she is SOB walking moderate distances on flat ground. HRs 80-100s at home. Weight up slightly with more ankle edema. she denies chest pain, palpitations,  PND, orthopnea, nausea, vomiting, dizziness, syncope, weight gain, or early satiety.  Past Medical History:  Diagnosis Date   Arrhythmia    Arthritis    Atrial fibrillation (HCC)    Constipation    chronic   COPD (chronic obstructive pulmonary disease) (HCC)    Hyperlipidemia    Hypertension    pulmonary   OSA on CPAP    Osteoporosis    RLS (restless legs syndrome)    Sleep apnea    Past Surgical History:  Procedure Laterality Date   ABDOMINAL AORTOGRAM W/LOWER EXTREMITY N/A 03/21/2020   Procedure: ABDOMINAL AORTOGRAM W/ Bilateral LOWER EXTREMITY Runoff;  Surgeon: Wellington Hampshire, MD;  Location: [facility];  Service: Cardiovascular;  Laterality: N/A;   ABDOMINAL HYSTERECTOMY     arm surgery Right    Broken arm and has a plate in it   CATARACT EXTRACTION Bilateral    COLONOSCOPY     More than 10 years ago In [place] ARTHROSCOPY Right    PERIPHERAL VASCULAR INTERVENTION Bilateral 03/21/2020   Procedure: PERIPHERAL VASCULAR INTERVENTION;  Surgeon: Wellington Hampshire, MD;  Location: [facility];  Service: Cardiovascular;  Laterality: Bilateral;  external iliac    Current Outpatient Medications  Medication Sig Dispense Refill   acetaminophen (TYLENOL) 325 MG tablet Take 650 mg  by mouth every 6 (six) hours as needed for moderate pain or headache.     albuterol (VENTOLIN HFA) 108 (90 Base) MCG/ACT inhaler Inhale 2 puffs into the lungs every 6 (six) hours as needed for wheezing or shortness of breath. 18 g 6   alendronate (FOSAMAX) 70 MG tablet Take 70 mg by mouth once a week.     apixaban (ELIQUIS) 5 MG TABS tablet Take 1 tablet (5 mg total) by mouth 2 (two) times daily. 30 tablet 6   Carboxymethylcellulose Sodium (THERATEARS OP) Place 1 drop into both eyes daily as needed (dry eyes).     carvedilol (COREG) 6.25 MG tablet Take 1 tablet (6.25 mg total) by mouth 2 (two) times daily. 60 tablet 3   cetirizine (ZYRTEC) 10 MG tablet Take 10 mg by mouth daily as needed for allergies.      clonazePAM (KLONOPIN) 0.5 MG tablet Take 1 tablet (0.5 mg total) by mouth at bedtime as needed for anxiety. 30 tablet 0   Coenzyme Q10 (COQ-10) 100 MG CAPS Take 100 mg by mouth daily.     diltiazem (CARDIZEM CD) 120 MG 24 hr capsule Take 1 capsule (120 mg total) by mouth daily. 30 capsule 6   gabapentin (NEURONTIN) 100 MG capsule Take 1 capsule by mouth in the morning and 2 capsules in the evening.     hydrochlorothiazide (HYDRODIURIL) 12.5 MG tablet Take 1 tablet (12.5 mg total)  by mouth daily. 90 tablet 3   irbesartan (AVAPRO) 75 MG tablet TAKE ONE TABLET BY MOUTH DAILY 90 tablet 3   Misc Natural Products (COLON CLEANSE) CAPS Take 4 capsules by mouth daily as needed (constipation).     Multiple Vitamins-Minerals (PRESERVISION AREDS 2 PO) Take by mouth in the morning and at bedtime.     Plecanatide (TRULANCE) 3 MG TABS Take 1 tablet by mouth daily. 90 tablet 3   polyethylene glycol (MIRALAX / GLYCOLAX) 17 g packet Take 17 g by mouth daily as needed.     rOPINIRole (REQUIP) 0.25 MG tablet Take 1 tablet in the morning and 2 tablets at bedtime. 270 tablet 1   STIOLTO RESPIMAT 2.5-2.5 MCG/ACT AERS USE 2 INHALATIONS ORALLY   EVERY MORNING 12 g 3   zinc gluconate 50 MG tablet Take 50 mg by mouth  daily.     No current facility-administered medications for this visit.    Allergies  Allergen Reactions   Lovenox [Enoxaparin Sodium] Hives and Rash    PT STATES SHE BROKE OUT IN A RASH HEAD TO TOE AND LASTED ABOUT 3 WEEKS    Metoprolol Succinate [Metoprolol] Other (See Comments)    Muscle aches, hand pain, and tingling    Social History   Socioeconomic History   Marital status: Widowed    Spouse name: Not on file   Number of children: 3   Years of education: Not on file   Highest education level: Not on file  Occupational History   Occupation: retired    Comment: retired Licensed conveyancer  Tobacco Use   Smoking status: Former    Packs/day: 1.50    Years: 30.00    Pack years: 45.00    Types: Cigarettes    Start date: 1956    Quit date: 11/04/1991    Years since quitting: 29.9   Smokeless tobacco: Never  Vaping Use   Vaping Use: Never used  Substance and Sexual Activity   Alcohol use: Yes    Alcohol/week: 1.0 standard drink    Types: 1 Glasses of wine per week    Comment: nightly   Drug use: No   Sexual activity: Not Currently  Other Topics Concern   Not on file  Social History Narrative   Lives with daughter   Social Determinants of Health   Financial Resource Strain: Not on file  Food Insecurity: Not on file  Transportation Needs: Not on file  Physical Activity: Not on file  Stress: Not on file  Social Connections: Not on file  Intimate Partner Violence: Not on file    Review of Systems: All other systems reviewed and are otherwise negative except as noted above.  Physical Exam: Vitals:   10/10/21 0907  BP: 114/60  Pulse: 99  SpO2: 96%  Weight: 140 lb 6.4 oz (63.7 kg)  Height: 5\' 2"  (1.575 m)    GEN- The patient is well appearing, alert and oriented x 3 today.   HEENT: normocephalic, atraumatic; sclera clear, conjunctiva pink; hearing intact; oropharynx clear; neck supple, no JVP Lymph- no cervical lymphadenopathy Lungs- Clear to ausculation  bilaterally, normal work of breathing.  No wheezes, rales, rhonchi Heart- Irregularly irregular rate and rhythm, no murmurs, rubs or gallops, PMI not laterally displaced GI- soft, non-tender, non-distended, bowel sounds present, no hepatosplenomegaly Extremities- no clubbing, cyanosis, or edema; DP/PT/radial pulses 2+ bilaterally MS- no significant deformity or atrophy Skin- warm and dry, no rash or lesion Psych- euthymic mood, full affect Neuro- strength and sensation are  intact  EKG is not ordered. Personal review of EKG from 08/2021 shows NSR at 62 bpm REPORT of EKG from yesterday states Fib/flutter, not yet scanned in. Kardia mobile today shows irregularly irregular HR in the   Additional studies reviewed include: Previous EP and gen cards notes.   Assessment and Plan:  1. Atrial fibrillation/typical atrial flutter with variable rates Remain on Eliquis on CHA2DS2VASC of at least 4.   She has failed flecainide and amiodarone Echo 09/2020 60-65%, update pending which may help guide AF care.  Continue coreg 6.25 mg BID Continue diltiazem 120 mg daily for now. Up-titrate as needed for rate control. She is now willing to consider Tikosyn. Will make close AF clinic follow up to consider.  She would likely need to start at 250 mcg.  Her Weight fluctuates right around 60 kg. Her CrCl fluctuates right around 50 mL/min. Appropriate dosing of her [place] may prove difficult.  She has not had bleeding in the past per chart review, so when borderline may need to err on the side of stroke reduction and maintain higher dose unless renal function changes significantly.    2. Acute on chronic diastolic CHF In setting of AF.  Continue HCTz for now, will need to come off if proceed with Tikosyn. Add lasix 20 mg to use as needed (likely will need at least twice weekly). I recommended she take it Monday and Friday to start.   2. OSA Continue nightly CPAP   3. HTN Well controlled today.    4.  DOE Initially significantly improved off amiodarone. Now likely in setting of diastolic CHF with AF with variable rates.   5. COPD High res chest CT showed "Multiple irregular masslike and nodular foci of consolidation in the peripheral peribronchovascular lungs bilaterally, predominantly in the mid to lower lungs, with associated mild-to-moderate bronchiectasis and patchy tree-in-bud opacities. Findings are similar to minimally improved since recent 01/04/2021 CT abdomen study. Findings are new since 2011 chest CT study. Favor recurrent or chronic bronchopneumonia such as due to aspiration or atypical mycobacterial infection (MAI). Neoplasm is difficult to exclude in this high risk patient but is less favored. Close noncontrast chest CT follow-up recommended initially in 3 months."   Sees pulm.    Follow up with  AF clinic  in 1-2 weeks to consider Tikosyn admission. She is going to visit family for all of January, so admission would likely need to take place first of February.   She may need [place] in the interim, but Eliquis adjusted 12/6 due to increase in rate so would need TEE.   [name], PA-C  10/10/21 9:25 AM

## 2021-10-10 ENCOUNTER — Other Ambulatory Visit: Payer: Self-pay

## 2021-10-10 ENCOUNTER — Encounter: Payer: Self-pay | Admitting: Student

## 2021-10-10 ENCOUNTER — Ambulatory Visit (INDEPENDENT_AMBULATORY_CARE_PROVIDER_SITE_OTHER): Payer: Medicare Other | Admitting: Student

## 2021-10-10 VITALS — BP 114/60 | HR 99 | Ht 62.0 in | Wt 140.4 lb

## 2021-10-10 DIAGNOSIS — Z79899 Other long term (current) drug therapy: Secondary | ICD-10-CM | POA: Diagnosis not present

## 2021-10-10 DIAGNOSIS — Z7901 Long term (current) use of anticoagulants: Secondary | ICD-10-CM

## 2021-10-10 DIAGNOSIS — Z9989 Dependence on other enabling machines and devices: Secondary | ICD-10-CM

## 2021-10-10 DIAGNOSIS — I480 Paroxysmal atrial fibrillation: Secondary | ICD-10-CM | POA: Diagnosis not present

## 2021-10-10 DIAGNOSIS — I10 Essential (primary) hypertension: Secondary | ICD-10-CM | POA: Diagnosis not present

## 2021-10-10 DIAGNOSIS — R0609 Other forms of dyspnea: Secondary | ICD-10-CM | POA: Diagnosis not present

## 2021-10-10 DIAGNOSIS — G4733 Obstructive sleep apnea (adult) (pediatric): Secondary | ICD-10-CM | POA: Diagnosis not present

## 2021-10-10 MED ORDER — FUROSEMIDE 20 MG PO TABS: 20.0000 mg | ORAL_TABLET | ORAL | 3 refills | Status: DC | PRN

## 2021-10-10 NOTE — Patient Instructions (Signed)
Medication Instructions:  Your physician has recommended you make the following change in your medication:   START: Furosemide (lasix) 20mg  as needed  *If you need a refill on your cardiac medications before your next appointment, please call your pharmacy*   Lab Work: None If you have labs (blood work) drawn today and your tests are completely normal, you will receive your results only by: [place] (if you have MyChart) OR A paper copy in the mail If you have any lab test that is abnormal or we need to change your treatment, we will call you to review the results.   Follow-Up: At [facility], you and your health needs are our priority.  As part of our continuing mission to provide you with exceptional heart care, we have created designated Provider Care Teams.  These Care Teams include your primary Cardiologist (physician) and Advanced Practice Providers (APPs -  Physician Assistants and Nurse Practitioners) who all work together to provide you with the care you need, when you need it.   Your next appointment:   1-2 week(s) for Tikosyn consideration  The format for your next appointment:   In Person  Provider:   You will follow up in the [facility] located at [facility]. Your provider will be: Roderic Palau, NP

## 2021-10-11 ENCOUNTER — Other Ambulatory Visit: Payer: Self-pay | Admitting: Emergency Medicine

## 2021-10-15 NOTE — Addendum Note (Signed)
Addended by: Wonda Horner on: 10/15/2021 04:04 PM   Modules accepted: Orders

## 2021-10-19 ENCOUNTER — Other Ambulatory Visit: Payer: Self-pay | Admitting: General Practice

## 2021-10-21 ENCOUNTER — Other Ambulatory Visit: Payer: Self-pay

## 2021-10-21 ENCOUNTER — Ambulatory Visit (INDEPENDENT_AMBULATORY_CARE_PROVIDER_SITE_OTHER): Payer: Medicare Other

## 2021-10-21 DIAGNOSIS — R0609 Other forms of dyspnea: Secondary | ICD-10-CM

## 2021-10-21 LAB — ECHOCARDIOGRAM COMPLETE
AR max vel: 1.5 cm2
AV Area VTI: 1.78 cm2
AV Area mean vel: 1.63 cm2
AV Mean grad: 2 mmHg
AV Peak grad: 4.5 mmHg
AV Vena cont: 0.35 cm
Ao pk vel: 1.06 m/s
Area-P 1/2: 4.96 cm2
P 1/2 time: 445 msec
S' Lateral: 2.67 cm

## 2021-10-22 ENCOUNTER — Encounter (HOSPITAL_COMMUNITY): Payer: Self-pay | Admitting: Nurse Practitioner

## 2021-10-22 ENCOUNTER — Other Ambulatory Visit: Payer: Self-pay

## 2021-10-22 ENCOUNTER — Ambulatory Visit (HOSPITAL_COMMUNITY)
Admission: RE | Admit: 2021-10-22 | Discharge: 2021-10-22 | Disposition: A | Discharge status: Home / Self Care | Payer: Medicare Other | Source: Ambulatory Visit | Attending: Physician Assistant | Admitting: Physician Assistant

## 2021-10-22 VITALS — BP 136/72 | HR 88 | Ht 62.0 in | Wt 138.0 lb

## 2021-10-22 DIAGNOSIS — I4892 Unspecified atrial flutter: Secondary | ICD-10-CM | POA: Insufficient documentation

## 2021-10-22 DIAGNOSIS — Z09 Encounter for follow-up examination after completed treatment for conditions other than malignant neoplasm: Secondary | ICD-10-CM | POA: Diagnosis not present

## 2021-10-22 DIAGNOSIS — Z87891 Personal history of nicotine dependence: Secondary | ICD-10-CM | POA: Insufficient documentation

## 2021-10-22 DIAGNOSIS — I10 Essential (primary) hypertension: Secondary | ICD-10-CM | POA: Insufficient documentation

## 2021-10-22 DIAGNOSIS — J449 Chronic obstructive pulmonary disease, unspecified: Secondary | ICD-10-CM | POA: Insufficient documentation

## 2021-10-22 DIAGNOSIS — Z79899 Other long term (current) drug therapy: Secondary | ICD-10-CM | POA: Insufficient documentation

## 2021-10-22 DIAGNOSIS — Z7901 Long term (current) use of anticoagulants: Secondary | ICD-10-CM | POA: Insufficient documentation

## 2021-10-22 DIAGNOSIS — G4733 Obstructive sleep apnea (adult) (pediatric): Secondary | ICD-10-CM | POA: Diagnosis not present

## 2021-10-22 DIAGNOSIS — E785 Hyperlipidemia, unspecified: Secondary | ICD-10-CM | POA: Insufficient documentation

## 2021-10-22 DIAGNOSIS — I480 Paroxysmal atrial fibrillation: Secondary | ICD-10-CM | POA: Diagnosis not present

## 2021-10-22 DIAGNOSIS — I739 Peripheral vascular disease, unspecified: Secondary | ICD-10-CM | POA: Insufficient documentation

## 2021-10-22 DIAGNOSIS — D6869 Other thrombophilia: Secondary | ICD-10-CM | POA: Diagnosis not present

## 2021-10-22 NOTE — Progress Notes (Signed)
Primary Care Physician: Michael Boston, MD Primary Cardiologist: Dr Debara Pickett Primary Electrophysiologist: Dr Curt Bears Referring Physician: Dr Odis Hollingshead Holly Benson is a 87 y.o. female with a history of HTN, atrial flutter, HLD, OSA, COPD, PAD, and atrial fibrillation who presents for follow up in the [facility]. She was previously on amiodarone but this was discontinued due to concern about possible lung toxicity. She states that over the last few weeks she has had frequent "fluttering" sensations in her chest which last 10-15 minutes but can happen multiple times per day. She states that this feels "totally different" from her afib in the past. She denies any other symptoms. Patient is on Eliquis for a CHADS2VASC score of 5.  [name] for afib. Pt has been seen by  [name], [place] , 12/8  and [name], 12/6. When she was seen last by [name], July 2022, tikosyn was discussed but pt was not ready to commit. She was started on more rate control on last visit with [name], and eliquis was put back at 5mg  bid as she had gained weight. She is planning to go to [facility] for the month of January. She in interested in [place] when she returns in February. She is rate controlled. Weight is stable.    Today, she denies symptoms of chest pain, shortness of breath, orthopnea, PND, lower extremity edema, dizziness, presyncope, syncope, snoring, daytime somnolence, bleeding, or neurologic sequela. The patient is tolerating medications without difficulties and is otherwise without complaint today.    Atrial Fibrillation Risk Factors:  she does have symptoms or diagnosis of sleep apnea. she is compliant with CPAP therapy. she does not have a history of rheumatic fever.   she has a BMI of Body mass index is 25.24 kg/m.Marland Kitchen Filed Weights   10/22/21 1508  Weight: 62.6 kg    Family History  Problem Relation Age of Onset   Suicidality  Father    Stroke Maternal Grandfather    Hypertension Sister    Colon cancer Neg Hx    Esophageal cancer Neg Hx    Pancreatic cancer Neg Hx    Stomach cancer Neg Hx    Liver disease Neg Hx      Atrial Fibrillation Management history:  Previous antiarrhythmic drugs: flecainide, amiodarone  Previous cardioversions: none Previous ablations: none CHADS2VASC score: 5 Anticoagulation history: Eliquis   Past Medical History:  Diagnosis Date   Arrhythmia    Arthritis    Atrial fibrillation (HCC)    Constipation    chronic   COPD (chronic obstructive pulmonary disease) (HCC)    Hyperlipidemia    Hypertension    pulmonary   OSA on CPAP    Osteoporosis    RLS (restless legs syndrome)    Sleep apnea    Past Surgical History:  Procedure Laterality Date   ABDOMINAL AORTOGRAM W/LOWER EXTREMITY N/A 03/21/2020   Procedure: ABDOMINAL AORTOGRAM W/ Bilateral LOWER EXTREMITY Runoff;  Surgeon: Wellington Hampshire, MD;  Location: [facility];  Service: Cardiovascular;  Laterality: N/A;   ABDOMINAL HYSTERECTOMY     arm surgery Right    Broken arm and has a plate in it   CATARACT EXTRACTION Bilateral    COLONOSCOPY     More than 10 years ago In [place] ARTHROSCOPY Right    PERIPHERAL VASCULAR INTERVENTION Bilateral 03/21/2020   Procedure: PERIPHERAL VASCULAR INTERVENTION;  Surgeon: Wellington Hampshire,  MD;  Location: [facility];  Service: Cardiovascular;  Laterality: Bilateral;  external iliac    Current Outpatient Medications  Medication Sig Dispense Refill   acetaminophen (TYLENOL) 325 MG tablet Take 650 mg by mouth every 6 (six) hours as needed for moderate pain or headache.     albuterol (VENTOLIN HFA) 108 (90 Base) MCG/ACT inhaler Inhale 2 puffs into the lungs every 6 (six) hours as needed for wheezing or shortness of breath. 18 g 6   alendronate (FOSAMAX) 70 MG tablet Take 70 mg by mouth once a week.     apixaban (ELIQUIS) 5 MG TABS tablet Take 1 tablet (5  mg total) by mouth 2 (two) times daily. 30 tablet 6   Carboxymethylcellulose Sodium (THERATEARS OP) Place 1 drop into both eyes daily as needed (dry eyes).     carvedilol (COREG) 6.25 MG tablet TAKE ONE TABLET BY MOUTH TWICE A DAY 60 tablet 3   cetirizine (ZYRTEC) 10 MG tablet Take 10 mg by mouth daily as needed for allergies.      clonazePAM (KLONOPIN) 0.5 MG tablet Take 1 tablet (0.5 mg total) by mouth at bedtime as needed for anxiety. 30 tablet 0   Coenzyme Q10 (COQ-10) 100 MG CAPS Take 100 mg by mouth daily.     diltiazem (CARDIZEM CD) 120 MG 24 hr capsule Take 1 capsule (120 mg total) by mouth daily. 30 capsule 6   furosemide (LASIX) 20 MG tablet Take 1 tablet (20 mg total) by mouth as needed. (Patient taking differently: Take 20 mg by mouth as needed. Take fridays and mondays) 30 tablet 3   gabapentin (NEURONTIN) 100 MG capsule Take 1 capsule by mouth in the morning and 2 capsules in the evening.     hydrochlorothiazide (HYDRODIURIL) 12.5 MG tablet Take 1 tablet (12.5 mg total) by mouth daily. 90 tablet 3   irbesartan (AVAPRO) 75 MG tablet TAKE ONE TABLET BY MOUTH DAILY 90 tablet 3   Misc Natural Products (COLON CLEANSE) CAPS Take 4 capsules by mouth daily as needed (constipation).     Multiple Vitamins-Minerals (PRESERVISION AREDS 2 PO) Take by mouth in the morning and at bedtime.     Plecanatide (TRULANCE) 3 MG TABS Take 1 tablet by mouth daily. 90 tablet 3   polyethylene glycol (MIRALAX / GLYCOLAX) 17 g packet Take 17 g by mouth daily as needed.     rOPINIRole (REQUIP) 0.25 MG tablet Take 1 tablet in the morning and 2 tablets at bedtime. 270 tablet 1   STIOLTO RESPIMAT 2.5-2.5 MCG/ACT AERS USE 2 INHALATIONS ORALLY   EVERY MORNING 12 g 1   zinc gluconate 50 MG tablet Take 50 mg by mouth daily.     No current facility-administered medications for this encounter.    Allergies  Allergen Reactions   Lovenox [Enoxaparin Sodium] Hives and Rash    PT STATES SHE BROKE OUT IN A RASH HEAD TO  TOE AND LASTED ABOUT 3 WEEKS    Metoprolol Succinate [Metoprolol] Other (See Comments)    Muscle aches, hand pain, and tingling    Social History   Socioeconomic History   Marital status: Widowed    Spouse name: Not on file   Number of children: 3   Years of education: Not on file   Highest education level: Not on file  Occupational History   Occupation: retired    Comment: retired Licensed conveyancer  Tobacco Use   Smoking status: Former    Packs/day: 1.50    Years: 30.00  Pack years: 45.00    Types: Cigarettes    Start date: 34    Quit date: 11/04/1991    Years since quitting: 29.9   Smokeless tobacco: Never  Vaping Use   Vaping Use: Never used  Substance and Sexual Activity   Alcohol use: Yes    Alcohol/week: 1.0 standard drink    Types: 1 Glasses of wine per week    Comment: nightly   Drug use: No   Sexual activity: Not Currently  Other Topics Concern   Not on file  Social History Narrative   Lives with daughter   Social Determinants of Health   Financial Resource Strain: Not on file  Food Insecurity: Not on file  Transportation Needs: Not on file  Physical Activity: Not on file  Stress: Not on file  Social Connections: Not on file  Intimate Partner Violence: Not on file     ROS- All systems are reviewed and negative except as per the HPI above.  Physical Exam: Vitals:   10/22/21 1508  BP: 136/72  Pulse: 88  SpO2: 94%  Weight: 62.6 kg  Height: 5\' 2"  (1.575 m)    GEN- The patient is a well appearing elderly female, alert and oriented x 3 today.   Head- normocephalic, atraumatic Eyes-  Sclera clear, conjunctiva pink Ears- hearing intact Oropharynx- clear Neck- supple  Lungs- Clear to ausculation bilaterally, normal work of breathing Heart- irregular rate and rhythm, no murmurs, rubs or gallops  GI- soft, NT, ND, + BS Extremities- no clubbing, cyanosis, or edema MS- no significant deformity or atrophy Skin- no rash or lesion Psych- euthymic mood,  full affect Neuro- strength and sensation are intact  Wt Readings from Last 3 Encounters:  10/22/21 62.6 kg  10/10/21 63.7 kg  10/08/21 63.5 kg    EKG today demonstrates  Atrial flutter at 88 bpm  QRS duration 90 ms QT/QTcB 362/438  ms  Echo 09/26/20 demonstrated  1. Left ventricular ejection fraction, by estimation, is 60 to 65%. The  left ventricle has normal function. The left ventricle has no regional  wall motion abnormalities. Left ventricular diastolic parameters are  indeterminate.   2. Right ventricular systolic function is normal. The right ventricular  size is normal. There is moderately elevated pulmonary artery systolic  pressure. The estimated right ventricular systolic pressure is 54.2 mmHg.   3. Left atrial size was severely dilated.   4. Right atrial size was severely dilated.   5. The mitral valve is normal in structure. Trivial mitral valve  regurgitation. No evidence of mitral stenosis.   6. Tricuspid valve regurgitation is moderate.   7. The aortic valve is tricuspid. Aortic valve regurgitation is trivial.  Mild to moderate aortic valve sclerosis/calcification is present, without  any evidence of aortic stenosis.   8. The inferior vena cava is normal in size with greater than 50%  respiratory variability, suggesting right atrial pressure of 3 mmHg.   Epic records are reviewed at length today  CHA2DS2-VASc Score =  4 The patient's score is based upon:   Echo- 10/22/21-  1. Left ventricular ejection fraction, by estimation, is 65 to 70%. The  left ventricle has normal function. The left ventricle has no regional  wall motion abnormalities. Left ventricular diastolic function could not  be evaluated. The average left  ventricular global longitudinal strain is -10.5 %. The global longitudinal  strain is abnormal.   2. Right ventricular systolic function is moderately reduced. The right  ventricular size is moderately  enlarged. There is mildly elevated   pulmonary artery systolic pressure. The estimated right ventricular  systolic pressure is 16.1 mmHg.   3. Left atrial size was severely dilated.   4. Right atrial size was severely dilated.   5. The mitral valve is normal in structure. Mild to moderate mitral valve  regurgitation. No evidence of mitral stenosis.   6. Tricuspid valve regurgitation is severe.   7. The aortic valve is normal in structure. Aortic valve regurgitation is  mild. No aortic stenosis is present.   8. The inferior vena cava is dilated in size with >50% respiratory  variability, suggesting right atrial pressure of 8 mmHg.   Comparison(s): EF 60%, RA & LA severe dilated, moderate TR.    ASSESSMENT AND PLAN: 1. Paroxysmal Atrial Fibrillation/atrial flutter Failed flecainide and amiodarone. We discussed therapeutic options today, specifically dofetilide. Patient is willing to  proceed with dofetilide admission when she returns from [facility] 12/02/20. She will call the office to set up on return home  She will need to stop HCTZ 3 days before admit  Continue Cardizem 120 mg daily  Continue Eliquis  5mg  BID Continue  carvedilol 6.25 mg bid  I informed her of signs to watch for that may indicate fluid retention and notify office if seen  Avoid salty foods while traveling   2. Secondary Hypercoagulable State (ICD10:  D68.69) The patient is at significant risk for stroke/thromboembolism based upon her CHA2DS2-VASc Score of  .  Continue Apixaban (Eliquis).   3. HTN Stable, no changes today.  4. Obstructive sleep apnea The importance of adequate treatment of sleep apnea was discussed today in order to improve our ability to maintain sinus rhythm long term. Followed by Dr Claiborne Billings   Follow up in the AF clinic pending  return to [facility] for Tikosyn admit, pt to call and schedule early February    Aryelle Figg C. Marquise Lambson, [facility] [address] [place], [place] [address] [phone]

## 2021-10-24 ENCOUNTER — Telehealth: Payer: Self-pay | Admitting: Pharmacist

## 2021-10-24 NOTE — Telephone Encounter (Signed)
Medication list reviewed in anticipation of upcoming Tikosyn initiation. Mild drug interactions with her albuterol, Stiolto, diltiazem and furosemide but she is ok to continue each of those. She does take HCTZ which is contraindicated and will need to be discontinued at least 3 days before Tikosyn initiation.  Patient is anticoagulated on Eliquis 5mg  BID on the appropriate dose. Please ensure that patient has not missed any anticoagulation doses in the 3 weeks prior to Tikosyn initiation.   Patient will need to be counseled to avoid use of Benadryl while on Tikosyn and in the 2-3 days prior to Tikosyn initiation.

## 2021-11-03 HISTORY — PX: OTHER SURGICAL HISTORY: SHX169

## 2021-11-06 ENCOUNTER — Telehealth: Payer: Self-pay | Admitting: Internal Medicine

## 2021-11-06 MED ORDER — IRBESARTAN 75 MG PO TABS: 75.0000 mg | ORAL_TABLET | Freq: Every day | ORAL | 3 refills | Status: DC

## 2021-11-06 MED ORDER — CARVEDILOL 6.25 MG PO TABS: 6.2500 mg | ORAL_TABLET | Freq: Two times a day (BID) | ORAL | 3 refills | Status: DC

## 2021-11-06 NOTE — Telephone Encounter (Signed)
Spoke to [place] with [facility].Advised patient no longer takes Metoprolol.She is taking Carvedilol 6.25 mg twice a day.New prescription sent.

## 2021-11-06 NOTE — Telephone Encounter (Signed)
New Message:   Reference Number #[id number]   Need to verify an allergy alert on patient's Metoprolol.

## 2021-11-12 ENCOUNTER — Encounter (HOSPITAL_COMMUNITY): Payer: Self-pay

## 2021-11-22 ENCOUNTER — Other Ambulatory Visit: Payer: Self-pay | Admitting: Neurology

## 2021-11-25 ENCOUNTER — Ambulatory Visit: Payer: Medicare Other | Admitting: Adult Health

## 2021-11-25 NOTE — Telephone Encounter (Signed)
Received refill request for clonazepam.  Last OV was on 08/15/21.  Next OV is scheduled for 12/02/21 .  Last RX was written on 08/16/21 for 30 tabs.   [employer] Drug Database has been reviewed.

## 2021-11-25 NOTE — Telephone Encounter (Signed)
This medication is prescrbied for RLS/ myoclonic jerks, and not anxiety .

## 2021-12-02 ENCOUNTER — Other Ambulatory Visit: Payer: Self-pay

## 2021-12-02 ENCOUNTER — Ambulatory Visit: Payer: Medicare Other | Admitting: Adult Health

## 2021-12-02 NOTE — Progress Notes (Deleted)
No chief complaint on file.    HISTORY OF PRESENT ILLNESS:   Update 12/02/2021 JM: Patient returns for RLS and sleep apnea f/u after prior visit with Dr. Brett Fairy 3 months ago.         Update 08/15/2021 Dr. Brett Fairy: Holly Benson has felt that it was 4 times daily ropinirole and up to 4 times daily gabapentin she was able to get by with little clonazepam intake -maybe 50 tablets a year.   However nothing has come trolled her restless leg syndrome as well as clonazepam.  There is also the feeling that she still has medication induced sleepiness. She feels RLS were worse when she is seated and awake rather than in bed.  She does not suddenly fall asleep but she does feel fatigued and sleepy her on medication.  Her last 3 visit in his office were actually done with nurse practitioner Holly Benson on 12/18/2020 on 3 6 and on 04-23-21.   I will follow this patient after September 2023 for central apnea if she likes me to.   Update 12/18/2020 AL: Holly Benson is a 88 y.o. female here today for follow up for RLS. Clonazepam was weaned and she was started on gabapentin. She reports that after about 2 months, gabapentin was not effective. She was then started on ropinirole 0.25mg  BID. She feels that it may help some but not as good as clonazepam. She was started on clonzepam prn about 25 years ago with PCP. She is doing fairly well, today. She continues to have some difficulty with restless legs and feels it is improved with walking. She has had concerns of tingling of her fingertips, L>R for several months. She feels this was improved on gabapentin. No history of neuropathy. Fingers tingle most of the time then turn red and go numb when hands are cold. Mother had Raynaud's.     (copied from Dr Dohmeier's previous note) Holly Benson is a 88 y.o. year old  Caucasian female patient seen here upon a referral by PCP on 07/30/2020  for an evaluation of RLS, in the setting of treated OSA ( the patient  is established with Dr Claiborne Billings as her sleep doctor, but has not been asked to follow up, can't remember meeting him in person).    Chief concern according to patient :   Holly Benson reports problems with insomnia created by restless legs, she has a feeling of having to rub or mesh her legs and she can hardly sit still.  She has undergone a femoral artery stent earlier this year so there was definitely a peripheral vascular disease, she had a humerus fracture on the right side in 2014 she underwent cataract surgeries and hysterectomy in the 1990s.  She has a longstanding history of hypertension, coronary artery and heart disease and atrial fibrillation she dates the onset of her cardiac problems to 2016.  She has been followed by Lyman Bishop, MD who referred her to the [facility] lab after her atrial fibrillation diagnosis which is paroxysmal atrial fibrillation.  At the time she was already on Eliquis, chronically anticoagulated , has failed ca cannel blockers and toprol in rate  control-  taken  Amiodarone since . Dr Curt Bears recommneded medication changes -  Pravachol, Cozaar Microzide and Proventil inhaler.  She is taking Klonopin half milligram tablets. She had an unsuccesfull  Cardioversion-  The first sleep study at [facility] showed a sleep onset after 21 minutes with a sleep efficiency of 73.5% study  was performed on 12-27-2018.  She had leg related arousals at index of 5.8/h so periodic limb movements definitely contributed to poor sleep.  She was diagnosed with moderate obstructive sleep apnea an AHI of 16.8 which is still actually mild, severe when she resumed supine sleep position.  There were also some central apnea noted and I would continue to see this as a complex sleep apnea rather than obstructive sleep apnea.  She returned for a CPAP titration which then had to switch to BiPAP modality on 2-28 2020.  Sleep efficiency improved.  She did not have periodic limb movements and related  arousals as she was under CPAP and BiPAP.   Her EKG showed PVCs but no atrial fibrillation during the night.  CPAP had been initiated at 5 cm and was titrated up to 11 cmH2O when her AHI was 20.8/h.  BiPAP was then started at 12/8 and titrated to 14/10 but central apneas were still present and the AHI was still 52/h.  She was given a full facemask during the titration.  Since she failed to respond to CPAP and BiPAP she returned for an ASV titration addressing the residual central apneas.  And an ASV pressure of 5 over 4/15 cmH2O her AHI was reduced to 1.1.  Central apneas were eliminated, interestingly snoring is now reported again.  There were no clinically significant periodic limb movements noted.  The setting for the ASV machine is an EPAP of minimum 5 a pressure support of minimum 4 maximum 15 and a breeze rate was set on auto there is no set rate for breathing backup.  She started treatment in October 2020 so not even a year ago. She also has a diagnosis now of pulmonary hypertension which was not listed previously.   She reports now that she still has restless legs keeping her from sleeping she has struggled with atrial fibrillation and hypertension over the past year again.  Multiple medication adjustments have not yet controlled her blood pressure.  She does not have chest pain headaches or dizziness.  She has been fully vaccinated for Covid with 2 shots.      Family medical /sleep history: no  other family member on CPAP with OSA, insomnia, sleep walkers.    Social history:  Patient is a retired Licensed conveyancer,  and lives in a household with her daughter and the family-   Family status is widowed , with adult children, 8 grandchildren.  Tobacco use: quit 1993 .   ETOH use, Caffeine intake in form of Coffee( 1 cup in AM ) Soda( /) Tea ( /) or energy drinks. Regular exercise in form of walking.    Hobbies : reading .        Sleep habits are as follows: The patient's dinner time is between 5-7  PM. The patient goes to bed at 9-10 PM and continues to sleep for 2-3  hours, wakes for a long time after that- unclear about the trigger, the first time at 12-1 AM.   The preferred sleep position is laterally, with the support of 1-2 pillows. Dreams are reportedly rare. 6  AM is the usual rise time. The patient wakes up spontaneously.  She reports not feeling refreshed or restored in AM, with symptoms such as dry mouth , morning headaches rarely , and sinus pressure- and residual fatigue.  Naps are taken seldomly.     REVIEW OF SYSTEMS: Out of a complete 14 system review of symptoms, the patient complains only of the following  symptoms, tingling of fingertips, restless legs and all other reviewed systems are negative.    ALLERGIES: Allergies  Allergen Reactions   Lovenox [Enoxaparin Sodium] Hives and Rash    PT STATES SHE BROKE OUT IN A RASH HEAD TO TOE AND LASTED ABOUT 3 WEEKS    Metoprolol Succinate [Metoprolol] Other (See Comments)    Muscle aches, hand pain, and tingling     HOME MEDICATIONS: Outpatient Medications Prior to Visit  Medication Sig Dispense Refill   acetaminophen (TYLENOL) 325 MG tablet Take 650 mg by mouth every 6 (six) hours as needed for moderate pain or headache.     albuterol (VENTOLIN HFA) 108 (90 Base) MCG/ACT inhaler Inhale 2 puffs into the lungs every 6 (six) hours as needed for wheezing or shortness of breath. 18 g 6   alendronate (FOSAMAX) 70 MG tablet Take 70 mg by mouth once a week.     apixaban (ELIQUIS) 5 MG TABS tablet Take 1 tablet (5 mg total) by mouth 2 (two) times daily. 30 tablet 6   Carboxymethylcellulose Sodium (THERATEARS OP) Place 1 drop into both eyes daily as needed (dry eyes).     carvedilol (COREG) 6.25 MG tablet Take 1 tablet (6.25 mg total) by mouth 2 (two) times daily. 180 tablet 3   cetirizine (ZYRTEC) 10 MG tablet Take 10 mg by mouth daily as needed for allergies.      clonazePAM (KLONOPIN) 0.5 MG tablet TAKE 1 TABLET AT BEDTIME  ASNEEDED FOR ANXIETY 30 tablet 3   Coenzyme Q10 (COQ-10) 100 MG CAPS Take 100 mg by mouth daily.     diltiazem (CARDIZEM CD) 120 MG 24 hr capsule Take 1 capsule (120 mg total) by mouth daily. 30 capsule 6   furosemide (LASIX) 20 MG tablet Take 1 tablet (20 mg total) by mouth as needed. (Patient taking differently: Take 20 mg by mouth as needed. Take fridays and mondays) 30 tablet 3   gabapentin (NEURONTIN) 100 MG capsule Take 1 capsule by mouth in the morning and 2 capsules in the evening.     hydrochlorothiazide (HYDRODIURIL) 12.5 MG tablet Take 1 tablet (12.5 mg total) by mouth daily. 90 tablet 3   irbesartan (AVAPRO) 75 MG tablet Take 1 tablet (75 mg total) by mouth daily. 90 tablet 3   Misc Natural Products (COLON CLEANSE) CAPS Take 4 capsules by mouth daily as needed (constipation).     Multiple Vitamins-Minerals (PRESERVISION AREDS 2 PO) Take by mouth in the morning and at bedtime.     Plecanatide (TRULANCE) 3 MG TABS Take 1 tablet by mouth daily. 90 tablet 3   polyethylene glycol (MIRALAX / GLYCOLAX) 17 g packet Take 17 g by mouth daily as needed.     rOPINIRole (REQUIP) 0.25 MG tablet Take 1 tablet in the morning and 2 tablets at bedtime. 270 tablet 1   STIOLTO RESPIMAT 2.5-2.5 MCG/ACT AERS USE 2 INHALATIONS ORALLY   EVERY MORNING 12 g 1   zinc gluconate 50 MG tablet Take 50 mg by mouth daily.     No facility-administered medications prior to visit.     PAST MEDICAL HISTORY: Past Medical History:  Diagnosis Date   Arrhythmia    Arthritis    Atrial fibrillation (HCC)    Constipation    chronic   COPD (chronic obstructive pulmonary disease) (HCC)    Hyperlipidemia    Hypertension    pulmonary   OSA on CPAP    Osteoporosis    RLS (restless legs syndrome)  Sleep apnea      PAST SURGICAL HISTORY: Past Surgical History:  Procedure Laterality Date   ABDOMINAL AORTOGRAM W/LOWER EXTREMITY N/A 03/21/2020   Procedure: ABDOMINAL AORTOGRAM W/ Bilateral LOWER EXTREMITY [place];   Surgeon: Wellington Hampshire, MD;  Location: [facility];  Service: Cardiovascular;  Laterality: N/A;   ABDOMINAL HYSTERECTOMY     arm surgery Right    Broken arm and has a plate in it   CATARACT EXTRACTION Bilateral    COLONOSCOPY     More than 10 years ago In [place] ARTHROSCOPY Right    PERIPHERAL VASCULAR INTERVENTION Bilateral 03/21/2020   Procedure: PERIPHERAL VASCULAR INTERVENTION;  Surgeon: Wellington Hampshire, MD;  Location: [facility];  Service: Cardiovascular;  Laterality: Bilateral;  external iliac     FAMILY HISTORY: Family History  Problem Relation Age of Onset   Suicidality Father    Stroke Maternal Grandfather    Hypertension Sister    Colon cancer Neg Hx    Esophageal cancer Neg Hx    Pancreatic cancer Neg Hx    Stomach cancer Neg Hx    Liver disease Neg Hx      SOCIAL HISTORY: Social History   Socioeconomic History   Marital status: Widowed    Spouse name: Not on file   Number of children: 3   Years of education: Not on file   Highest education level: Not on file  Occupational History   Occupation: retired    Comment: retired Licensed conveyancer  Tobacco Use   Smoking status: Former    Packs/day: 1.50    Years: 30.00    Pack years: 45.00    Types: Cigarettes    Start date: 1956    Quit date: 11/04/1991    Years since quitting: 30.0   Smokeless tobacco: Never  Vaping Use   Vaping Use: Never used  Substance and Sexual Activity   Alcohol use: Yes    Alcohol/week: 1.0 standard drink    Types: 1 Glasses of wine per week    Comment: nightly   Drug use: No   Sexual activity: Not Currently  Other Topics Concern   Not on file  Social History Narrative   Lives with daughter   Social Determinants of Health   Financial Resource Strain: Not on file  Food Insecurity: Not on file  Transportation Needs: Not on file  Physical Activity: Not on file  Stress: Not on file  Social Connections: Not on file  Intimate Partner Violence: Not on  file      PHYSICAL EXAM  There were no vitals filed for this visit.  There is no height or weight on file to calculate BMI.   Generalized: Well developed, in no acute distress  Cardiology: normal rate and rhythm, no murmur auscultated  Respiratory: clear to auscultation bilaterally    Neurological examination  Mentation: Alert oriented to time, place, history taking. Follows all commands speech and language fluent Cranial nerve II-XII: Pupils were equal round reactive to light. Extraocular movements were full, visual field were full on confrontational test. Facial sensation and strength were normal.  Head turning and shoulder shrug  were normal and symmetric. Motor: The motor testing reveals 5 over 5 strength of all 4 extremities. Good symmetric motor tone is noted throughout.  Sensory: Sensory testing is intact to soft touch on all 4 extremities. No evidence of extinction is noted.  Gait and station: Gait is normal. T Reflexes: Deep tendon reflexes are symmetric  and normal bilaterally.     DIAGNOSTIC DATA (LABS, IMAGING, TESTING) - I reviewed patient records, labs, notes, testing and imaging myself where available.  Lab Results  Component Value Date   WBC 6.7 10/08/2021   HGB 13.2 10/08/2021   HCT 39.1 10/08/2021   MCV 98 (H) 10/08/2021   PLT 288 10/08/2021      Component Value Date/Time   NA 134 (L) 09/02/2021 1229   NA 134 08/01/2020 1549   K 3.9 09/02/2021 1229   CL 99 09/02/2021 1229   CO2 25 09/02/2021 1229   GLUCOSE 102 (H) 09/02/2021 1229   BUN 15 09/02/2021 1229   BUN 21 08/01/2020 1549   CREATININE 0.67 09/02/2021 1229   CALCIUM 8.7 (L) 09/02/2021 1229   PROT 7.1 09/02/2021 1229   PROT 6.2 04/11/2020 0838   ALBUMIN 4.0 09/02/2021 1229   ALBUMIN 4.3 04/11/2020 0838   AST 16 09/02/2021 1229   ALT 14 09/02/2021 1229   ALKPHOS 76 09/02/2021 1229   BILITOT 0.5 09/02/2021 1229   BILITOT 0.5 04/11/2020 0838   GFRNONAA >60 09/02/2021 1229   GFRAA 59 (L)  08/01/2020 1549   Lab Results  Component Value Date   CHOL 149 04/11/2020   HDL 69 04/11/2020   LDLCALC 68 04/11/2020   TRIG 60 04/11/2020   CHOLHDL 2.2 04/11/2020   No results found for: HGBA1C No results found for: VITAMINB12 Lab Results  Component Value Date   TSH 2.770 08/28/2020    No flowsheet data found.   No flowsheet data found.   ASSESSMENT AND PLAN  88 y.o. year old female  has a past medical history of Arrhythmia, Arthritis, Atrial fibrillation ([place]), Constipation, COPD (chronic obstructive pulmonary disease) ([place]), Hyperlipidemia, Hypertension, OSA on CPAP, Osteoporosis, RLS (restless legs syndrome), and Sleep apnea. here with   No diagnosis found.  Kahlia is doing fairly well, today. She is tolerating ropinirole 0.25mg  BID with no obvious adverse effects. She feels that tingling in fingers was better on gabapentin. We will resume low dose at bedtime. She is aware to monitor for sedating side effects. We have discussed concerns of Raynaud's. May consider neuropathy workup in future. History of anemia. Healthy lifestyle habits encouraged. She will follow up in 6 months.   No orders of the defined types were placed in this encounter.    No orders of the defined types were placed in this encounter.     I spent 20 minutes of face-to-face and non-face-to-face time with patient.  This included previsit chart review, lab review, study review, order entry, electronic health record documentation, patient education.    Debbora Presto, MSN, FNP-C 12/02/2021, 9:33 AM  [facility] [address] [place], [place] [address] [phone]

## 2021-12-03 ENCOUNTER — Ambulatory Visit: Payer: Medicare Other | Admitting: Neurology

## 2021-12-10 ENCOUNTER — Ambulatory Visit (HOSPITAL_COMMUNITY): Payer: Medicare Other | Admitting: Physician Assistant

## 2021-12-10 NOTE — Progress Notes (Signed)
Cardiology Clinic Note   Patient Name: Holly Benson Date of Encounter: 12/12/2021  Primary Care Provider:  Michael Boston, MD Primary Cardiologist:  Pixie Casino, MD  Patient Profile    Holly Benson 88 year old female presents the clinic today for follow-up evaluation of her paroxysmal atrial fibrillation and hypertension.  Past Medical History    Past Medical History:  Diagnosis Date   Arrhythmia    Arthritis    Atrial fibrillation (HCC)    Constipation    chronic   COPD (chronic obstructive pulmonary disease) (HCC)    Hyperlipidemia    Hypertension    pulmonary   OSA on CPAP    Osteoporosis    RLS (restless legs syndrome)    Sleep apnea    Past Surgical History:  Procedure Laterality Date   ABDOMINAL AORTOGRAM W/LOWER EXTREMITY N/A 03/21/2020   Procedure: ABDOMINAL AORTOGRAM W/ Bilateral LOWER EXTREMITY [place];  Surgeon: Wellington Hampshire, MD;  Location: [facility];  Service: Cardiovascular;  Laterality: N/A;   ABDOMINAL HYSTERECTOMY     arm surgery Right    Broken arm and has a plate in it   CATARACT EXTRACTION Bilateral    COLONOSCOPY     More than 10 years ago In [place] ARTHROSCOPY Right    PERIPHERAL VASCULAR INTERVENTION Bilateral 03/21/2020   Procedure: PERIPHERAL VASCULAR INTERVENTION;  Surgeon: Wellington Hampshire, MD;  Location: [facility];  Service: Cardiovascular;  Laterality: Bilateral;  external iliac    Allergies  Allergies  Allergen Reactions   Lovenox [Enoxaparin Sodium] Hives and Rash    PT STATES SHE BROKE OUT IN A RASH HEAD TO TOE AND LASTED ABOUT 3 WEEKS    Metoprolol Succinate [Metoprolol] Other (See Comments)    Muscle aches, hand pain, and tingling    History of Present Illness    Holly Benson has a PMH of hypertension, atrial flutter, hyperlipidemia, obstructive sleep apnea, COPD, peripheral arterial disease, and atrial fibrillation.  CHA2DS2-VASc score 5 on Eliquis.   She was  seen by Malka So, PA-C on 05/17/2021.  Of note she was previously on amiodarone which was discontinued due to lung toxicity.  When she was seen in the atrial fibrillation clinic she described a frequent fluttering type sensation which would last for 10 to 15 minutes and happen multiple times per day.  She was started on diltiazem 30 mg daily.   She contacted the nurse triage line 07/11/2021.  Her metoprolol was transitioned to carvedilol.  With metoprolol she did notice tingling and numbness with an icy type pain in her thumb and first 2 fingers on each hand.   She presented to the clinic 07/15/2021 for follow-up evaluation stated she was out of town for several days and noticed that her blood pressure was low.  She reported that she stopped taking her afternoon dose of hydralazine and did not notice much change with her blood pressure.  She discontinued her morning dose of hydralazine as well.  She noticed a pain in her hands and tingling with metoprolol.  She also noticed some occasional ankle edema.  I changed her metoprolol to carvedilol, gave her the [employer] support stocking sheet, discontinued her hydralazine, gave her the salty 6 diet sheet, asked her to maintain her physical activity, and planned follow-up in 1 to 2 months.  Have asked her to maintain a blood pressure log.   She reported that she contacted the office earlier last week for follow-up appointment.  She was told that she would not be scheduled for an appointment with Dr. Debara Pickett for several months.  She contacted the patient advocate representatives with her complaint.  She was added onto my schedule today.  She followed up with [name] PA-C on 08/06/2021.  She had contacted the office with reports of increased palpitations.  She questioned if she was having more episodes of atrial fibrillation.  She presented to the clinic alone.  She reported that she did not want to distinguish between PACs and paroxysmal atrial fibrillation.  She noted  palpitations at least once daily that would last for around 30 minutes.  She denied dizziness, presyncope, syncope with the episodes.  She had not taken her as needed Cardizem.  She was maintaining a blood pressure log.  Her systolic blood pressures were in the 80- 100s.  She denied bleeding issues.  She was tolerating carvedilol well.  She presented to the clinic 10/08/2021 for follow-up evaluation stated over the last week she had noticed increased shortness of breath and had has been taking her diltiazem more regularly due to increased heart rate.  Her blood pressures had been slightly elevated.  In  clinic her heart rate was 88 with a blood pressure of 116/60.  She reported that she  had some increased phlegm and has just returned from seeing her family.  She had a call from her pharmacist who lowered her apixaban dosing due to her weight.  She had regained weight.  I  increased her apixaban dosing back to 5 mg twice daily.  I will ordered an echocardiogram, CBC, and scheduled follow-up for February.  She was seen by [name], PA-C 10/10/2021.  During that time she reported that she was doing okay overall.  She noted shortness of breath and fatigue x10 days.  It was felt that it was likely related to her atrial fibrillation.  Her heart rates at home were 80-1 100s.  Her weight was slightly increased and she noted more ankle swelling.  She was prescribed furosemide 20 mg as needed for lower extremity swelling.  She was seen by Dr. Arbie Cookey 10/22/2021.  At that time she was interested in starting Tikosyn in February because she was planning a trip to [place] for the month of January.  Her weight remains stable and her heart rate was rate controlled.  She presents to the clinic today for follow-up evaluation and states she had a nice trip to [facility].  She reports that someone told her to stop taking her diltiazem.  She has not taken the medication in around 1 and half months.  She reports that her heart  rate has been 90s and low 100s.  She feels that her shortness of breath was stable.  We reviewed the medication and recommendations for starting Tikosyn.  I have asked her to follow-up with the atrial fibrillation clinic, we will put her on 60 mg of extended release diltiazem, and plan follow-up for 3 to 4 months.   Today she denies chest pain, lower extremity edema,  melena, hematuria, hemoptysis, diaphoresis, weakness, presyncope, syncope, orthopnea, and PND.  Home Medications    Prior to Admission medications   Medication Sig Start Date End Date Taking? Authorizing Provider  acetaminophen (TYLENOL) 325 MG tablet Take 650 mg by mouth every 6 (six) hours as needed for moderate pain or headache.    [provider]  albuterol (VENTOLIN HFA) 108 (90 Base) MCG/ACT inhaler Inhale 2 puffs into the lungs every 6 (six)  hours as needed for wheezing or shortness of breath. 07/12/20   Collene Gobble, MD  alendronate (FOSAMAX) 70 MG tablet Take 70 mg by mouth once a week. 07/09/21   [provider]  apixaban (ELIQUIS) 2.5 MG TABS tablet Take 1 tablet (2.5 mg total) by mouth 2 (two) times daily. 05/08/21   Minus Breeding, MD  Carboxymethylcellulose Sodium (THERATEARS OP) Place 1 drop into both eyes daily as needed (dry eyes).    [provider]  carvedilol (COREG) 6.25 MG tablet Take 1 tablet (6.25 mg total) by mouth 2 (two) times daily. 07/15/21   Deberah Pelton, NP  cetirizine (ZYRTEC) 10 MG tablet Take 10 mg by mouth daily as needed for allergies.     [provider]  clonazePAM (KLONOPIN) 0.5 MG tablet Take 1 tablet (0.5 mg total) by mouth at bedtime as needed for anxiety. 08/15/21   Dohmeier, Asencion Partridge, MD  Coenzyme Q10 (COQ-10) 100 MG CAPS Take 100 mg by mouth daily.    [provider]  diltiazem (CARDIZEM) 30 MG tablet Take 1 tablet every 4 hours AS NEEDED for heart rate >100 05/17/21   Fenton, Clint R, PA  gabapentin (NEURONTIN) 100 MG capsule Take 1 capsule by  mouth in the morning and 2 capsules in the evening.    [provider]  hydrochlorothiazide (HYDRODIURIL) 12.5 MG tablet Take 1 tablet (12.5 mg total) by mouth daily. 08/06/21   Duke, Tami Lin, PA  irbesartan (AVAPRO) 75 MG tablet TAKE ONE TABLET BY MOUTH DAILY 10/18/20   Hilty, Nadean Corwin, MD  Misc Natural Products (COLON CLEANSE) CAPS Take 4 capsules by mouth daily as needed (constipation).    [provider]  Multiple Vitamins-Minerals (PRESERVISION AREDS 2 PO) Take by mouth in the morning and at bedtime.    [provider]  Plecanatide (TRULANCE) 3 MG TABS Take 1 tablet by mouth daily. 04/24/21   Esterwood, Amy S, PA-C  polyethylene glycol (MIRALAX / GLYCOLAX) 17 g packet Take 17 g by mouth daily as needed.    [provider]  rOPINIRole (REQUIP) 0.25 MG tablet Take 1 tablet in the morning and 2 tablets at bedtime. 08/15/21   Dohmeier, Asencion Partridge, MD  STIOLTO RESPIMAT 2.5-2.5 MCG/ACT AERS USE 2 INHALATIONS ORALLY   EVERY MORNING 08/23/20   Collene Gobble, MD  zinc gluconate 50 MG tablet Take 50 mg by mouth daily.    [provider]    Family History    Family History  Problem Relation Age of Onset   Suicidality Father    Stroke Maternal Grandfather    Hypertension Sister    Colon cancer Neg Hx    Esophageal cancer Neg Hx    Pancreatic cancer Neg Hx    Stomach cancer Neg Hx    Liver disease Neg Hx    She indicated that her mother is deceased. She indicated that her father is deceased. She indicated that her sister is alive. She indicated that her maternal grandfather is deceased. She indicated that the status of her neg hx is unknown.   Social History    Social History   Socioeconomic History   Marital status: Widowed    Spouse name: Not on file   Number of children: 3   Years of education: Not on file   Highest education level: Not on file  Occupational History   Occupation: retired    Comment: retired Licensed conveyancer  Tobacco Use    Smoking status: Former    Packs/day:  1.50    Years: 30.00    Pack years: 45.00    Types: Cigarettes    Start date: 32    Quit date: 11/04/1991    Years since quitting: 30.1   Smokeless tobacco: Never  Vaping Use   Vaping Use: Never used  Substance and Sexual Activity   Alcohol use: Yes    Alcohol/week: 1.0 standard drink    Types: 1 Glasses of wine per week    Comment: nightly   Drug use: No   Sexual activity: Not Currently  Other Topics Concern   Not on file  Social History Narrative   Lives with daughter   Social Determinants of Health   Financial Resource Strain: Not on file  Food Insecurity: Not on file  Transportation Needs: Not on file  Physical Activity: Not on file  Stress: Not on file  Social Connections: Not on file  Intimate Partner Violence: Not on file     Review of Systems    General:  No chills, fever, night sweats or weight changes.  Cardiovascular:  No chest pain, dyspnea on exertion, edema, orthopnea, palpitations, paroxysmal nocturnal dyspnea. Dermatological: No rash, lesions/masses Respiratory: No cough, dyspnea Urologic: No hematuria, dysuria Abdominal:   No nausea, vomiting, diarrhea, bright red blood per rectum, melena, or hematemesis Neurologic:  No visual changes, wkns, changes in mental status. All other systems reviewed and are otherwise negative except as noted above.  Physical Exam    VS:  BP 128/80    Pulse 94    Ht '5\' 2"'  (1.575 m)    Wt 137 lb (62.1 kg)    SpO2 97%    BMI 25.06 kg/m  , BMI Body mass index is 25.06 kg/m. GEN: Well nourished, well developed, in no acute distress. HEENT: normal. Neck: Supple, no JVD, carotid bruits, or masses. Cardiac: RRR, no murmurs, rubs, or gallops. No clubbing, cyanosis, edema.  Radials/DP/PT 2+ and equal bilaterally.  Respiratory:  Respirations regular and unlabored, clear to auscultation bilaterally. GI: Soft, nontender, nondistended, BS + x 4. MS: no deformity or atrophy. Skin: warm and dry,  no rash. Neuro:  Strength and sensation are intact. Psych: Normal affect.  Accessory Clinical Findings    Recent Labs: 09/02/2021: ALT 14; BUN 15; Creatinine, Ser 0.67; Potassium 3.9; Sodium 134 10/08/2021: Hemoglobin 13.2; Platelets 288   Recent Lipid Panel    Component Value Date/Time   CHOL 149 04/11/2020 0838   TRIG 60 04/11/2020 0838   HDL 69 04/11/2020 0838   CHOLHDL 2.2 04/11/2020 0838   LDLCALC 68 04/11/2020 0838    ECG personally reviewed by me today-atrial fibrillation/flutter with variable AV block 92 bpm- No acute changes  EKG 08/06/2021-normal sinus rhythm 62 bpm  Echocardiogram 09/26/2020 IMPRESSIONS     1. Left ventricular ejection fraction, by estimation, is 60 to 65%. The  left ventricle has normal function. The left ventricle has no regional  wall motion abnormalities. Left ventricular diastolic parameters are  indeterminate.   2. Right ventricular systolic function is normal. The right ventricular  size is normal. There is moderately elevated pulmonary artery systolic  pressure. The estimated right ventricular systolic pressure is 93.2 mmHg.   3. Left atrial size was severely dilated.   4. Right atrial size was severely dilated.   5. The mitral valve is normal in structure. Trivial mitral valve  regurgitation. No evidence of mitral stenosis.   6. Tricuspid valve regurgitation is moderate.   7. The aortic valve is tricuspid. Aortic valve regurgitation  is trivial.  Mild to moderate aortic valve sclerosis/calcification is present, without  any evidence of aortic stenosis.   8. The inferior vena cava is normal in size with greater than 50%  respiratory variability, suggesting right atrial pressure of 3 mmHg.   FINDINGS   Left Ventricle: Left ventricular ejection fraction, by estimation, is 60  to 65%. The left ventricle has normal function. The left ventricle has no  regional wall motion abnormalities. The left ventricular internal cavity  size was  normal in size. There is   no left ventricular hypertrophy. Left ventricular diastolic parameters  are indeterminate.   Right Ventricle: The right ventricular size is normal. Right vetricular  wall thickness was not assessed. Right ventricular systolic function is  normal. There is moderately elevated pulmonary artery systolic pressure.  The tricuspid regurgitant velocity is   3.38 m/s, and with an assumed right atrial pressure of 3 mmHg, the  estimated right ventricular systolic pressure is 66.2 mmHg.   Left Atrium: Left atrial size was severely dilated.   Right Atrium: Right atrial size was severely dilated.   Pericardium: There is no evidence of pericardial effusion.   Mitral Valve: The mitral valve is normal in structure. Trivial mitral  valve regurgitation. No evidence of mitral valve stenosis.   Tricuspid Valve: The tricuspid valve is normal in structure. Tricuspid  valve regurgitation is moderate.   Aortic Valve: The aortic valve is tricuspid. Aortic valve regurgitation is  trivial. Aortic regurgitation PHT measures 663 msec. Mild to moderate  aortic valve sclerosis/calcification is present, without any evidence of  aortic stenosis.   Pulmonic Valve: The pulmonic valve was not well visualized. Pulmonic valve  regurgitation is not visualized.   Aorta: The aortic root and ascending aorta are structurally normal, with  no evidence of dilitation.   Venous: The inferior vena cava is normal in size with greater than 50%  respiratory variability, suggesting right atrial pressure of 3 mmHg.   IAS/Shunts: No atrial level shunt detected by color flow Doppler.   Echocardiogram 10/21/2021 IMPRESSIONS     1. Left ventricular ejection fraction, by estimation, is 65 to 70%. The  left ventricle has normal function. The left ventricle has no regional  wall motion abnormalities. Left ventricular diastolic function could not  be evaluated. The average left  ventricular global  longitudinal strain is -10.5 %. The global longitudinal  strain is abnormal.   2. Right ventricular systolic function is moderately reduced. The right  ventricular size is moderately enlarged. There is mildly elevated  pulmonary artery systolic pressure. The estimated right ventricular  systolic pressure is 94.7 mmHg.   3. Left atrial size was severely dilated.   4. Right atrial size was severely dilated.   5. The mitral valve is normal in structure. Mild to moderate mitral valve  regurgitation. No evidence of mitral stenosis.   6. Tricuspid valve regurgitation is severe.   7. The aortic valve is normal in structure. Aortic valve regurgitation is  mild. No aortic stenosis is present.   8. The inferior vena cava is dilated in size with >50% respiratory  variability, suggesting right atrial pressure of 8 mmHg.   Comparison(s): EF 60%, RA & LA severe dilated, moderate TR.   Cardiac event monitor 05/22/2021 Max 124 bpm 09:07am, 07/16 Min 50 bpm 06:48am, 07/17 Avg 71 bpm 16.8% supraventricular ectopy <1% ventricular ectopy No atrial fibrillation noted Predominant rhythm was sinus rhythm Symptoms associated with sinus rhythm with PACs and atrial runs   Assessment &  Plan   1.  Paroxysmal atrial fibrillation-notices heart rate in the 90s and low 100s.  Misunderstood her diltiazem medication prescription and stopped taking sometime over the past 2 months.  Reports compliance with  apixaban CHA2DS2-VASc score 4.  She denies bleeding issues.  Weight stable.  Met with Laroy Apple, NP from A-fib clinic and discussed plan for starting Tikosyn, will have her follow-up with A-fib clinic Restart diltiazem 60 mg daily.   Continue apixaban to 5 mg twice daily Heart healthy low-sodium diet Increase physical activity as tolerated Avoid triggers caffeine, chocolate, EtOH, dehydration etc. We will contact A-fib clinic for moving forward with Tikosyn therapy.  Essential hypertension-BP today 128/80.   She has been tolerating carvedilol well.  Blood pressures well controlled at home. Continue carvedilol, hydrochlorothiazide, irbesartan Heart healthy low-sodium diet Increase physical activity as tolerated  Obstructive sleep apnea-reports continued compliance with CPAP.  Waking up well rested. Continue CPAP use   Disposition: Follow-up with Dr. Debara Pickett in 3-4 and A. fib clinic as scheduled.   Jossie Ng. Harlee Eckroth NP-C    12/12/2021, 11:37 AM [address] Office [phone] Fax [phone]  Notice: This dictation was prepared with Dragon dictation along with smaller phrase technology. Any transcriptional errors that result from this process are unintentional and may not be corrected upon review.  I spent 14 minutes examining this patient, reviewing medications, and using patient centered shared decision making involving her cardiac care.  Prior to her visit I spent greater than 20 minutes reviewing her past medical history,  medications, and prior cardiac tests.

## 2021-12-12 ENCOUNTER — Ambulatory Visit (INDEPENDENT_AMBULATORY_CARE_PROVIDER_SITE_OTHER): Payer: Medicare Other | Admitting: General Practice

## 2021-12-12 ENCOUNTER — Other Ambulatory Visit: Payer: Self-pay

## 2021-12-12 ENCOUNTER — Encounter: Payer: Self-pay | Admitting: General Practice

## 2021-12-12 VITALS — BP 128/80 | HR 94 | Ht 62.0 in | Wt 137.0 lb

## 2021-12-12 DIAGNOSIS — Z79899 Other long term (current) drug therapy: Secondary | ICD-10-CM

## 2021-12-12 DIAGNOSIS — G4733 Obstructive sleep apnea (adult) (pediatric): Secondary | ICD-10-CM | POA: Diagnosis not present

## 2021-12-12 DIAGNOSIS — Z9989 Dependence on other enabling machines and devices: Secondary | ICD-10-CM | POA: Diagnosis not present

## 2021-12-12 DIAGNOSIS — I480 Paroxysmal atrial fibrillation: Secondary | ICD-10-CM | POA: Diagnosis not present

## 2021-12-12 DIAGNOSIS — I10 Essential (primary) hypertension: Secondary | ICD-10-CM | POA: Diagnosis not present

## 2021-12-12 MED ORDER — DILTIAZEM HCL ER 60 MG PO CP12: 60.0000 mg | ORAL_CAPSULE | Freq: Two times a day (BID) | ORAL | 6 refills | Status: DC

## 2021-12-12 NOTE — Patient Instructions (Signed)
Medication Instructions:  START DILTIAZEM 60MG  DAILY  *If you need a refill on your cardiac medications before your next appointment, please call your pharmacy*  Lab Work:   Testing/Procedures:  NONE    NONE  Special Instructions FOLLOW UP WITH THE AFIB CLINIC  Follow-Up: Your next appointment:  3-4 month(s) In Person with Pixie Casino, MD   At [facility], you and your health needs are our priority.  As part of our continuing mission to provide you with exceptional heart care, we have created designated Provider Care Teams.  These Care Teams include your primary Cardiologist (physician) and Advanced Practice Providers (APPs -  Physician Assistants and Nurse Practitioners) who all work together to provide you with the care you need, when you need it.

## 2021-12-13 ENCOUNTER — Other Ambulatory Visit (HOSPITAL_COMMUNITY): Payer: Self-pay | Admitting: Cardiovascular Disease

## 2021-12-13 DIAGNOSIS — I739 Peripheral vascular disease, unspecified: Secondary | ICD-10-CM

## 2021-12-13 DIAGNOSIS — Z95828 Presence of other vascular implants and grafts: Secondary | ICD-10-CM

## 2021-12-16 ENCOUNTER — Other Ambulatory Visit: Payer: Self-pay

## 2021-12-16 ENCOUNTER — Other Ambulatory Visit (HOSPITAL_COMMUNITY): Payer: Self-pay | Admitting: Cardiovascular Disease

## 2021-12-16 ENCOUNTER — Ambulatory Visit (HOSPITAL_BASED_OUTPATIENT_CLINIC_OR_DEPARTMENT_OTHER)
Admission: RE | Admit: 2021-12-16 | Discharge: 2021-12-16 | Disposition: A | Discharge status: Home / Self Care | Payer: Medicare Other | Source: Ambulatory Visit | Attending: Internal Medicine | Admitting: Internal Medicine

## 2021-12-16 ENCOUNTER — Ambulatory Visit (HOSPITAL_COMMUNITY)
Admission: RE | Admit: 2021-12-16 | Discharge: 2021-12-16 | Disposition: A | Discharge status: Home / Self Care | Payer: Medicare Other | Source: Ambulatory Visit | Attending: Internal Medicine | Admitting: Internal Medicine

## 2021-12-16 DIAGNOSIS — Z95828 Presence of other vascular implants and grafts: Secondary | ICD-10-CM | POA: Insufficient documentation

## 2021-12-16 DIAGNOSIS — I739 Peripheral vascular disease, unspecified: Secondary | ICD-10-CM | POA: Diagnosis not present

## 2021-12-16 MED ORDER — APIXABAN 5 MG PO TABS: 5.0000 mg | ORAL_TABLET | Freq: Two times a day (BID) | ORAL | 6 refills | Status: DC

## 2021-12-16 NOTE — Telephone Encounter (Signed)
Prescription refill request for Eliquis received. Indication:Afib Last office visit:2/23 Scr:0.6 Age: 88 Weight:62.1 kg  Prescription refilled

## 2021-12-17 ENCOUNTER — Ambulatory Visit: Payer: Medicare Other | Admitting: Neurology

## 2021-12-23 ENCOUNTER — Other Ambulatory Visit: Payer: Self-pay

## 2021-12-23 MED ORDER — APIXABAN 5 MG PO TABS: 5.0000 mg | ORAL_TABLET | Freq: Two times a day (BID) | ORAL | 1 refills | Status: DC

## 2021-12-31 ENCOUNTER — Ambulatory Visit (INDEPENDENT_AMBULATORY_CARE_PROVIDER_SITE_OTHER): Payer: Medicare Other | Admitting: Cardiovascular Disease

## 2021-12-31 ENCOUNTER — Encounter: Payer: Self-pay | Admitting: Cardiovascular Disease

## 2021-12-31 ENCOUNTER — Other Ambulatory Visit: Payer: Self-pay

## 2021-12-31 VITALS — BP 110/64 | HR 72 | Resp 20 | Ht 63.0 in | Wt 135.2 lb

## 2021-12-31 DIAGNOSIS — I739 Peripheral vascular disease, unspecified: Secondary | ICD-10-CM

## 2021-12-31 DIAGNOSIS — I10 Essential (primary) hypertension: Secondary | ICD-10-CM | POA: Diagnosis not present

## 2021-12-31 DIAGNOSIS — R0989 Other specified symptoms and signs involving the circulatory and respiratory systems: Secondary | ICD-10-CM

## 2021-12-31 DIAGNOSIS — E785 Hyperlipidemia, unspecified: Secondary | ICD-10-CM

## 2021-12-31 DIAGNOSIS — I4819 Other persistent atrial fibrillation: Secondary | ICD-10-CM

## 2021-12-31 NOTE — Patient Instructions (Signed)
Medication Instructions:  No changes *If you need a refill on your cardiac medications before your next appointment, please call your pharmacy*   Lab Work: None ordered If you have labs (blood work) drawn today and your tests are completely normal, you will receive your results only by: [place] (if you have MyChart) OR A paper copy in the mail If you have any lab test that is abnormal or we need to change your treatment, we will call you to review the results.   Testing/Procedures: None ordered   Follow-Up: At [facility], you and your health needs are our priority.  As part of our continuing mission to provide you with exceptional heart care, we have created designated Provider Care Teams.  These Care Teams include your primary Cardiologist (physician) and Advanced Practice Providers (APPs -  Physician Assistants and Nurse Practitioners) who all work together to provide you with the care you need, when you need it.  We recommend signing up for the patient portal called "MyChart".  Sign up information is provided on this After Visit Summary.  MyChart is used to connect with patients for Virtual Visits (Telemedicine).  Patients are able to view lab/test results, encounter notes, upcoming appointments, etc.  Non-urgent messages can be sent to your provider as well.   To learn more about what you can do with MyChart, go to [email or web address].    Your next appointment:   12 month(s)  The format for your next appointment:   In Person  Provider:   Dr. Fletcher Anon

## 2021-12-31 NOTE — Progress Notes (Signed)
Cardiology Office Note   Date:  12/31/2021   ID:  Holly Benson, DOB 1933/02/26, MRN [id number]  PCP:  Michael Boston, MD  Cardiologist:  Dr. Debara Pickett  No chief complaint on file.     History of Present Illness: Holly Benson is a 88 y.o. female who is here today for follow-up visit regarding peripheral arterial disease.  She has known history of atrial fibrillation on long-term anticoagulation with Eliquis, essential hypertension, hyperlipidemia, anxiety and obstructive sleep apnea on CPAP.She is a previous smoker and has no history of diabetes.  She used to be on amiodarone but was discontinued due to lung toxicity. She was seen in 2021 for severe bilateral back and leg claudication.  Noninvasive vascular studies showed an ABI of 0.88 on the right and 0.69 on the left.  Duplex showed monophasic waveforms starting in the common femoral artery with no evidence of infrainguinal disease.  Aortoiliac duplex showed severe bilateral external iliac artery stenosis and significant left common iliac artery stenosis. Angiography was done in May 2021 which showed severe bilateral external iliac artery disease worse on the left side.  I performed successful drug-eluting self-expanding stent placement to bilateral external iliac arteries.    Most recent Doppler studies 2 weeks ago showed an ABI of 0.94 on the right and 0.92 on the left.  Duplex showed widely patent external iliac artery stents with no significant restenosis.  She has been doing well overall with no recent chest pain or worsening dyspnea.  She denies leg claudication although she continues to be bothered by chronic low back pain.  Past Medical History:  Diagnosis Date   Arrhythmia    Arthritis    Atrial fibrillation (HCC)    Constipation    chronic   COPD (chronic obstructive pulmonary disease) (HCC)    Hyperlipidemia    Hypertension    pulmonary   OSA on CPAP    Osteoporosis    RLS (restless legs syndrome)    Sleep apnea      Past Surgical History:  Procedure Laterality Date   ABDOMINAL AORTOGRAM W/LOWER EXTREMITY N/A 03/21/2020   Procedure: ABDOMINAL AORTOGRAM W/ Bilateral LOWER EXTREMITY Runoff;  Surgeon: Wellington Hampshire, MD;  Location: [facility];  Service: Cardiovascular;  Laterality: N/A;   ABDOMINAL HYSTERECTOMY     arm surgery Right    Broken arm and has a plate in it   CATARACT EXTRACTION Bilateral    COLONOSCOPY     More than 10 years ago In [place] ARTHROSCOPY Right    PERIPHERAL VASCULAR INTERVENTION Bilateral 03/21/2020   Procedure: PERIPHERAL VASCULAR INTERVENTION;  Surgeon: Wellington Hampshire, MD;  Location: [facility];  Service: Cardiovascular;  Laterality: Bilateral;  external iliac     Current Outpatient Medications  Medication Sig Dispense Refill   acetaminophen (TYLENOL) 325 MG tablet Take 650 mg by mouth every 6 (six) hours as needed for moderate pain or headache.     albuterol (VENTOLIN HFA) 108 (90 Base) MCG/ACT inhaler Inhale 2 puffs into the lungs every 6 (six) hours as needed for wheezing or shortness of breath. 18 g 6   apixaban (ELIQUIS) 5 MG TABS tablet Take 1 tablet (5 mg total) by mouth 2 (two) times daily. 180 tablet 1   Carboxymethylcellulose Sodium (THERATEARS OP) Place 1 drop into both eyes daily as needed (dry eyes).     carvedilol (COREG) 6.25 MG tablet Take 1 tablet (6.25 mg total) by mouth 2 (two)  times daily. 180 tablet 3   cetirizine (ZYRTEC) 10 MG tablet Take 10 mg by mouth daily as needed for allergies.      clonazePAM (KLONOPIN) 0.5 MG tablet TAKE 1 TABLET AT BEDTIME ASNEEDED FOR ANXIETY 30 tablet 3   Coenzyme Q10 (COQ-10) 100 MG CAPS Take 100 mg by mouth daily.     diltiazem (CARDIZEM SR) 60 MG 12 hr capsule Take 1 capsule (60 mg total) by mouth 2 (two) times daily. 30 capsule 6   furosemide (LASIX) 20 MG tablet Take 1 tablet (20 mg total) by mouth as needed. (Patient taking differently: Take 20 mg by mouth as needed. Take fridays and  mondays) 30 tablet 3   gabapentin (NEURONTIN) 100 MG capsule Take 1 capsule by mouth in the morning and 2 capsules in the evening.     irbesartan (AVAPRO) 75 MG tablet Take 1 tablet (75 mg total) by mouth daily. 90 tablet 3   Misc Natural Products (COLON CLEANSE) CAPS Take 4 capsules by mouth daily as needed (constipation).     Multiple Vitamins-Minerals (PRESERVISION AREDS 2 PO) Take by mouth in the morning and at bedtime.     polyethylene glycol (MIRALAX / GLYCOLAX) 17 g packet Take 17 g by mouth daily as needed.     rOPINIRole (REQUIP) 0.25 MG tablet Take 1 tablet in the morning and 2 tablets at bedtime. 270 tablet 1   STIOLTO RESPIMAT 2.5-2.5 MCG/ACT AERS USE 2 INHALATIONS ORALLY   EVERY MORNING 12 g 1   zinc gluconate 50 MG tablet Take 50 mg by mouth daily.     No current facility-administered medications for this visit.    Allergies:   Lovenox [enoxaparin sodium] and Metoprolol succinate [metoprolol]    Social History:  The patient  reports that she quit smoking about 30 years ago. Her smoking use included cigarettes. She started smoking about 67 years ago. She has a 45.00 pack-year smoking history. She has never used smokeless tobacco. She reports current alcohol use of about 1.0 standard drink per week. She reports that she does not use drugs.   Family History:  The patient's family history includes Hypertension in her sister; Stroke in her maternal grandfather; Suicidality in her father.    ROS:  Please see the history of present illness.   Otherwise, review of systems are positive for none.   All other systems are reviewed and negative.    PHYSICAL EXAM: VS:  BP 110/64 (BP Location: Left Arm, Patient Position: Sitting, Cuff Size: Normal)    Pulse 72    Resp 20    Ht 5\' 3"  (1.6 m)    Wt 135 lb 3.2 oz (61.3 kg)    SpO2 97%    BMI 23.95 kg/m  , BMI Body mass index is 23.95 kg/m. GEN: Well nourished, well developed, in no acute distress  HEENT: normal  Neck: no JVD,  or masses.   Right carotid bruit. Cardiac: Irregularly irregular, no murmurs, rubs, or gallops,no edema  Respiratory:  clear to auscultation bilaterally, normal work of breathing GI: soft, nontender, nondistended, + BS MS: no deformity or atrophy  Skin: warm and dry, no rash Neuro:  Strength and sensation are intact Psych: euthymic mood, full affect Vascular: Femoral pulse: +2 bilaterally.   EKG:  EKG is not ordered today.   Recent Labs: 09/02/2021: ALT 14; BUN 15; Creatinine, Ser 0.67; Potassium 3.9; Sodium 134 10/08/2021: Hemoglobin 13.2; Platelets 288    Lipid Panel    Component Value Date/Time  CHOL 149 04/11/2020 0838   TRIG 60 04/11/2020 0838   HDL 69 04/11/2020 0838   CHOLHDL 2.2 04/11/2020 0838   LDLCALC 68 04/11/2020 0838      Wt Readings from Last 3 Encounters:  12/31/21 135 lb 3.2 oz (61.3 kg)  12/12/21 137 lb (62.1 kg)  10/22/21 138 lb (62.6 kg)        PAD Screen 09/10/2017  Previous PAD dx? No  Previous surgical procedure? Yes  Dates of procedures arm 2014  Pain with walking? Yes  Subsides with rest? Yes  Feet/toe relief with dangling? No  Painful, non-healing ulcers? No  Extremities discolored? No      ASSESSMENT AND PLAN:  1.  Peripheral arterial disease: Status post  bilateral external iliac artery stent placement .  Recent Doppler study showed patent iliac stents without significant restenosis and near normal ABI.  She has chronic low back pain which does not seem to be due to peripheral arterial disease.  Continue medical therapy.  2.  Persistent atrial fibrillation: Amiodarone was discontinued due to lung toxicity.  Ventricular rate seems to be controlled.  Might be best to treat her with rate control given that she has no significant symptoms related to atrial fibrillation.  She is on anticoagulation with Eliquis with no bleeding side effects.  3.  Essential hypertension: Blood pressure is controlled on current medications.  4.  Hyperlipidemia: She  had myalgia with atorvastatin and does not want to try any other antihyperlipidemia medications.  This was discussed with her today.  5.  Right carotid bruit: Carotid Doppler showed mild nonobstructive disease bilaterally.    Disposition:   FU with me in 12 months  Signed,  Kathlyn Sacramento, MD  12/31/2021 10:47 AM    [place]

## 2022-01-13 ENCOUNTER — Ambulatory Visit: Payer: Medicare Other | Admitting: Family Medicine

## 2022-01-14 ENCOUNTER — Encounter: Payer: Self-pay | Admitting: Gastroenterology

## 2022-01-17 NOTE — Telephone Encounter (Signed)
Lets do gentle purge, dont want her to have too much diarrhea. May be just 4 capful of Miralax 16oz liquid ?

## 2022-01-21 ENCOUNTER — Encounter: Payer: Self-pay | Admitting: Gastroenterology

## 2022-01-21 ENCOUNTER — Ambulatory Visit (INDEPENDENT_AMBULATORY_CARE_PROVIDER_SITE_OTHER): Payer: Medicare Other | Admitting: Gastroenterology

## 2022-01-21 VITALS — BP 120/58 | HR 62 | Ht 62.0 in | Wt 136.2 lb

## 2022-01-21 DIAGNOSIS — K469 Unspecified abdominal hernia without obstruction or gangrene: Secondary | ICD-10-CM

## 2022-01-21 DIAGNOSIS — R140 Abdominal distension (gaseous): Secondary | ICD-10-CM

## 2022-01-21 DIAGNOSIS — K5904 Chronic idiopathic constipation: Secondary | ICD-10-CM | POA: Diagnosis not present

## 2022-01-21 NOTE — Progress Notes (Signed)
? ?       ? ?Holly Benson    [id number]    12-14-1932 ? ?Primary Care Physician:Wile, Jesse Sans, MD ? ?Referring Physician: Michael Boston, MD ?[address] ?[place],  [place] [address] ? ? ?Chief complaint:  Constipation, umbilical hernia, abdominal bloating ? ?HPI: ? ?88 year old very pleasant female with multiple comorbidities including hypertension, proximal A. fib, COPD, cardiomyopathy here for follow-up for chronic idiopathic constipation ?  ?She is currently using MiraLAX 1 capful twice daily and also Trulance 3 mg daily with some improvement of her bowel habits.  She continues to have alternating constipation and diarrhea.  She recently started using pudding with wheat bread, applesauce and prune juice, she is having better results. ?  ?She has intermittent discomfort associated with the ventral hernia.  She does not like the abdominal wall binder, causes discomfort ?  ?Complaint worsening abdominal bloating and excessive gas, worse in the evening or when she lays down in the night ?  ?  ?No family history of colon cancer, IBD or celiac disease. ?  ?  ?CT chest high-resolution January 15, 2021 ?1. Multiple irregular masslike and nodular foci of consolidation in ?the peripheral peribronchovascular lungs bilaterally, predominantly ?in the mid to lower lungs, with associated mild-to-moderate ?bronchiectasis and patchy tree-in-bud opacities. ?  ?CT abdomen pelvis with contrast January 04, 2021 ?1. Moderate to large upper right-sided anterior abdominal wall ?hernia containing a portion of the transverse colon but no findings ?for incarceration. ?2. Patchy multifocal airspace opacities at both lung bases likely an ?inflammatory or infectious process. Recommend correlation with ?patient's clinical symptoms. ?3. Underlying emphysematous changes and pulmonary scarring. ?4. Advanced atherosclerotic calcifications involving the aorta and ?iliac arteries. ? ? ?Outpatient Encounter Medications as of 01/21/2022  ?Medication Sig   ? acetaminophen (TYLENOL) 325 MG tablet Take 650 mg by mouth every 6 (six) hours as needed for moderate pain or headache.  ? albuterol (VENTOLIN HFA) 108 (90 Base) MCG/ACT inhaler Inhale 2 puffs into the lungs every 6 (six) hours as needed for wheezing or shortness of breath.  ? apixaban (ELIQUIS) 5 MG TABS tablet Take 1 tablet (5 mg total) by mouth 2 (two) times daily.  ? Carboxymethylcellulose Sodium (THERATEARS OP) Place 1 drop into both eyes daily as needed (dry eyes).  ? carvedilol (COREG) 6.25 MG tablet Take 1 tablet (6.25 mg total) by mouth 2 (two) times daily.  ? cetirizine (ZYRTEC) 10 MG tablet Take 10 mg by mouth daily as needed for allergies.   ? clonazePAM (KLONOPIN) 0.5 MG tablet TAKE 1 TABLET AT BEDTIME ASNEEDED FOR ANXIETY  ? Coenzyme Q10 (COQ-10) 100 MG CAPS Take 100 mg by mouth daily.  ? diltiazem (CARDIZEM SR) 60 MG 12 hr capsule Take 1 capsule (60 mg total) by mouth 2 (two) times daily.  ? gabapentin (NEURONTIN) 100 MG capsule Take 1 capsule by mouth in the morning and 2 capsules in the evening.  ? irbesartan (AVAPRO) 75 MG tablet Take 1 tablet (75 mg total) by mouth daily.  ? Misc Natural Products (COLON CLEANSE) CAPS Take 4 capsules by mouth daily as needed (constipation).  ? Multiple Vitamins-Minerals (PRESERVISION AREDS 2 PO) Take by mouth in the morning and at bedtime.  ? polyethylene glycol (MIRALAX / GLYCOLAX) 17 g packet Take 17 g by mouth daily as needed.  ? rOPINIRole (REQUIP) 0.25 MG tablet Take 1 tablet in the morning and 2 tablets at bedtime.  ? STIOLTO RESPIMAT 2.5-2.5 MCG/ACT AERS USE 2 INHALATIONS ORALLY  EVERY MORNING  ? zinc gluconate 50 MG tablet Take 50 mg by mouth daily.  ? furosemide (LASIX) 20 MG tablet Take 1 tablet (20 mg total) by mouth as needed. (Patient taking differently: Take 20 mg by mouth as needed. Take fridays and mondays)  ? ?No facility-administered encounter medications on file as of 01/21/2022.  ? ? ?Allergies as of 01/21/2022 - Review Complete 01/21/2022   ?Allergen Reaction Noted  ? Lovenox [enoxaparin sodium] Hives and Rash 08/02/2019  ? Metoprolol succinate [metoprolol] Other (See Comments) 12/26/2016  ? ? ?Past Medical History:  ?Diagnosis Date  ? Arrhythmia   ? Arthritis   ? Atrial fibrillation ([place])   ? Constipation   ? chronic  ? COPD (chronic obstructive pulmonary disease) ([place])   ? Hyperlipidemia   ? Hypertension   ? pulmonary  ? OSA on CPAP   ? Osteoporosis   ? RLS (restless legs syndrome)   ? Sleep apnea   ? ? ?Past Surgical History:  ?Procedure Laterality Date  ? ABDOMINAL AORTOGRAM W/LOWER EXTREMITY N/A 03/21/2020  ? Procedure: ABDOMINAL AORTOGRAM W/ Bilateral LOWER EXTREMITY Runoff;  Surgeon: Wellington Hampshire, MD;  Location: [facility];  Service: Cardiovascular;  Laterality: N/A;  ? ABDOMINAL HYSTERECTOMY    ? arm surgery Right   ? Broken arm and has a plate in it  ? CATARACT EXTRACTION Bilateral   ? COLONOSCOPY    ? More than 10 years ago In [facility]  ? [place] ARTHROSCOPY Right   ? PERIPHERAL VASCULAR INTERVENTION Bilateral 03/21/2020  ? Procedure: PERIPHERAL VASCULAR INTERVENTION;  Surgeon: Wellington Hampshire, MD;  Location: [facility];  Service: Cardiovascular;  Laterality: Bilateral;  external iliac  ? ? ?Family History  ?Problem Relation Age of Onset  ? Suicidality Father   ? Stroke Maternal Grandfather   ? Hypertension Sister   ? Colon cancer Neg Hx   ? Esophageal cancer Neg Hx   ? Pancreatic cancer Neg Hx   ? Stomach cancer Neg Hx   ? Liver disease Neg Hx   ? ? ?Social History  ? ?Socioeconomic History  ? Marital status: Widowed  ?  Spouse name: Not on file  ? Number of children: 3  ? Years of education: Not on file  ? Highest education level: Not on file  ?Occupational History  ? Occupation: retired  ?  Comment: retired Licensed conveyancer  ?Tobacco Use  ? Smoking status: Former  ?  Packs/day: 1.50  ?  Years: 30.00  ?  Pack years: 45.00  ?  Types: Cigarettes  ?  Start date: 74  ?  Quit date: 11/04/1991  ?  Years since quitting: 30.2  ?  Smokeless tobacco: Never  ?Vaping Use  ? Vaping Use: Never used  ?Substance and Sexual Activity  ? Alcohol use: Yes  ?  Alcohol/week: 1.0 standard drink  ?  Types: 1 Glasses of wine per week  ?  Comment: nightly  ? Drug use: No  ? Sexual activity: Not Currently  ?Other Topics Concern  ? Not on file  ?Social History Narrative  ? Lives with daughter  ? ?Social Determinants of Health  ? ?Financial Resource Strain: Not on file  ?Food Insecurity: Not on file  ?Transportation Needs: Not on file  ?Physical Activity: Not on file  ?Stress: Not on file  ?Social Connections: Not on file  ?Intimate Partner Violence: Not on file  ? ? ? ? ?Review of systems: ?All other review of systems negative except  as mentioned in the HPI. ? ? ?Physical Exam: ?Vitals:  ? 01/21/22 0917  ?BP: (!) 120/58  ?Pulse: 62  ?SpO2: 97%  ? ?Body mass index is 24.92 kg/m?. ?Gen:      No acute distress ?HEENT:  sclera anicteric ?Abd:      soft, non-tender; no palpable masses, no distension ?Ext:    No edema ?Neuro: alert and oriented x 3 ?Psych: normal mood and affect ? ?Data Reviewed: ? ?Reviewed labs, radiology imaging, old records and pertinent past GI work up ? ? ?Assessment and Plan/Recommendations: ? ?88 year old very pleasant female with history of hypertension, COPD, sleep apnea, cardiomyopathy, proximal A. fib, large ventral hernia with chronic idiopathic constipation ?  ?Chronic idiopathic constipation ?Continue MiraLAX 1-2 capful daily as needed ?Continue with herbal fiber capsule and pudding with food bran and prune juice ?Use stool softener daily at bedtime ?Continue 8 cups of water daily  ?  ?Abdominal ventral hernia, she is not a surgical candidate due to her age and co morbidities ?Advised patient to continue to wear abdominal wall binder but try maternity clothing that has abdominal wall support. ?Avoid lifting heavy weights or excessive straining ? ?Abdominal bloating secondary to ventral hernia and chronic idiopathic constipation:  Continue with dietary modifications ?Use simethicone 1 capsule up to 3 times daily as needed ?  ?Return in 3 months  ? ?This visit required 30 minutes of patient care (this includes precharting, chart review, r

## 2022-01-21 NOTE — Patient Instructions (Signed)
Use abdominal binder or maternity clothing ? ?Continue herbal fiber 1 capful daily ? ?Continue stool softener at bedtime ? ?Use Miralax 1-2 capful daily in the AM with breakfast  ? ?Follow up in 3 months ? ?If you are age 86 or older, your body mass index should be between 23-30. Your Body mass index is 24.92 kg/m?Marland Kitchen If this is out of the aforementioned range listed, please consider follow up with your Primary Care Provider. ? ?If you are age 82 or younger, your body mass index should be between 19-25. Your Body mass index is 24.92 kg/m?Marland Kitchen If this is out of the aformentioned range listed, please consider follow up with your Primary Care Provider.  ? ?________________________________________________________ ? ?The [facility] providers would like to encourage you to use [facility] to communicate with providers for non-urgent requests or questions.  Due to long hold times on the telephone, sending your provider a message by [facility] may be a faster and more efficient way to get a response.  Please allow 48 business hours for a response.  Please remember that this is for non-urgent requests.  ?_______________________________________________________  ? ?I appreciate the  opportunity to care for you ? ?Thank You  ? ?Harl Bowie , MD   ?

## 2022-01-30 ENCOUNTER — Telehealth: Payer: Self-pay | Admitting: Pharmacist

## 2022-01-30 NOTE — Telephone Encounter (Signed)
Medication list reviewed in anticipation of upcoming Tikosyn initiation. Patient is on albuterol and Stiolto inhalers which can increase risk of QTc prolongation but are ok to continue. Diltiazem can increase concentrations of Tikosyn, also ok to continue though. ? ?Patient is anticoagulated on Eliquis '5mg'$  BID on the appropriate dose. Please ensure that patient has not missed any anticoagulation doses in the 3 weeks prior to Tikosyn initiation.  ? ?Patient will need to be counseled to avoid use of Benadryl while on Tikosyn and in the 2-3 days prior to Tikosyn initiation. ? ?

## 2022-01-31 ENCOUNTER — Other Ambulatory Visit (HOSPITAL_COMMUNITY): Payer: Self-pay | Admitting: *Deleted

## 2022-01-31 DIAGNOSIS — J449 Chronic obstructive pulmonary disease, unspecified: Secondary | ICD-10-CM | POA: Diagnosis not present

## 2022-01-31 DIAGNOSIS — M810 Age-related osteoporosis without current pathological fracture: Secondary | ICD-10-CM | POA: Diagnosis not present

## 2022-01-31 DIAGNOSIS — E785 Hyperlipidemia, unspecified: Secondary | ICD-10-CM | POA: Diagnosis not present

## 2022-01-31 DIAGNOSIS — I480 Paroxysmal atrial fibrillation: Secondary | ICD-10-CM | POA: Diagnosis not present

## 2022-01-31 MED ORDER — HYDROCHLOROTHIAZIDE 12.5 MG PO CAPS: 12.5000 mg | ORAL_CAPSULE | Freq: Every day | ORAL | 3 refills | Status: DC

## 2022-01-31 NOTE — Telephone Encounter (Signed)
Patient mediations reviewed -pt currently still taking HCTZ 12.'5mg'$  daily although it is not on her current medication list. Pt states it must have been removed in error. Added back to medication list and instructed to stop HCTZ at least 3 days prior to admission. Pt in agreement. ?

## 2022-02-03 ENCOUNTER — Other Ambulatory Visit: Payer: Self-pay | Admitting: Student

## 2022-02-13 ENCOUNTER — Ambulatory Visit (INDEPENDENT_AMBULATORY_CARE_PROVIDER_SITE_OTHER): Payer: Medicare Other | Admitting: Neurology

## 2022-02-13 ENCOUNTER — Encounter: Payer: Self-pay | Admitting: Neurology

## 2022-02-13 VITALS — BP 106/60 | HR 80 | Ht 62.0 in | Wt 137.0 lb

## 2022-02-13 DIAGNOSIS — G2581 Restless legs syndrome: Secondary | ICD-10-CM | POA: Diagnosis not present

## 2022-02-13 DIAGNOSIS — G620 Drug-induced polyneuropathy: Secondary | ICD-10-CM | POA: Diagnosis not present

## 2022-02-13 DIAGNOSIS — Z789 Other specified health status: Secondary | ICD-10-CM | POA: Diagnosis not present

## 2022-02-13 DIAGNOSIS — G4731 Primary central sleep apnea: Secondary | ICD-10-CM

## 2022-02-13 DIAGNOSIS — R202 Paresthesia of skin: Secondary | ICD-10-CM

## 2022-02-13 DIAGNOSIS — I480 Paroxysmal atrial fibrillation: Secondary | ICD-10-CM | POA: Diagnosis not present

## 2022-02-13 MED ORDER — GABAPENTIN 100 MG PO CAPS: ORAL_CAPSULE | ORAL | 5 refills | Status: DC

## 2022-02-13 MED ORDER — ROPINIROLE HCL 0.25 MG PO TABS: ORAL_TABLET | ORAL | 3 refills | Status: DC

## 2022-02-13 MED ORDER — CLONAZEPAM 0.5 MG PO TABS: ORAL_TABLET | ORAL | 1 refills | Status: DC

## 2022-02-13 NOTE — Progress Notes (Signed)
? ? ? ?SLEEP MEDICINE CLINIC ?  ? ?Provider:  Larey Seat, MD  ?Primary Care Physician:  Michael Boston, MD ?[address] ?[place] [address]  ? ?  ?Referring Provider: Michael Boston, Md ?[address] ?[place],  [place] [address]  ?  ?  ?    ?Chief Complaint according to patient   ?Patient presents with:  ?  ? New Patient (Initial Visit)  ?   pt alone, rm 10. presents today for concerns of RLS. she has struggled with this for years. she states that she was started on clonazepam 0.5 mg several years ago and she would take 1/2 tablet every now and then. a 30 day script would last 6 mths. she states more recently this has worsened and finds she needs the whole tablet and using it more frequently. she asked PCP if there something else she can take for RLS they sent her here.  ? ?08-15-2021: RV -Presents today for follow up. She states she continues to have ongoing concerns with RLS symptoms. She also says for > 6 mths she has been having numbness.tingling in bilateral thumb,pointer and middle fingers. Pt also wanted to know if Dr Brett Fairy would be willing to take on her ASV- she is not getting proper f/u care with her machine and would really like to address this here. She is set up with a machine through [facility] and they don't help with mask refits. Her last SS was in 2020.  ? ?02-13-2022; Here to f/u for ASV ( which she has not been followed here  in the past - Amelia Jo is Dr Evette Georges sleep patient  , he prescribed ASV, and his sleep tests were not resulted ) , neuropathy and RLS. Pt reports doing well on ASV. Pt would like to know how severe her sleep apnea is and how dangerous it is to sleep w/out it. RLS is about the same. Mainly when sitting down for long period. Better when laying down or walking.  ?She feels she is not getting enough air. She is up and up all night- nocturia.  ?  ?  ?  ?HISTORY OF PRESENT ILLNESS:  ? ? ?Holly Benson is a 88 y.o. year old  Caucasian female patient seen here upon a referral by PCP on  02/13/2022 Presents today for follow up on 02-13-2022;  ? ?Holly Benson used to be a patient of Dr. Claiborne Billings but switched her sleep care over to [place] last year.  She still has restless legs and they may never be completely controllable. She has atrial fibrillation and is symptomatic, she is to be seen at [facility] in the next week for [place].  Her ASV machine is serving her well in terms of apnea control but because she is so often up and down at night for nocturia reasons it is hard for her to get more than 4 hours each night.  She uses the machine every day-her compliance by time was 75% 23 out of 30 days.  She on average reaches 5 hours and 56 minutes at night on ASV therapy.  Her current machine is set with a minimum expiratory pressure of 5 and maximum expiratory pressure of 10 and minimum pressure support of 3 and a maximum pressure support of 15.  So her residual AHI is 4.1 which is very good she does have a high amount of air leakage, uses a nasal cradle or pillow. Not a P10.  ?She used aerocare on friendly- now  adapt.  ?She is recovering from dehydration, but remains on HCTZ. She was on amiodarone.  ? ? ? ? ? ? ? ?2022;  She states she continues to have ongoing concerns with RLS symptoms. She also says for > 6 mths she has been having numbness.tingling in bilateral thumb,pointer and middle fingers.  ?PS : Pt also wanted to know if Dr Brett Fairy would be willing to take on her ASV- she is not getting proper f/u care with her machine ( prescribed by dr Claiborne Billings, Cardiologist) and would really like to address this here. She is set up with a machine through [facility] and they don't help with mask refits. Her last SS was in 2020 Dr Claiborne Billings. The studies were never read- EPIC NOTE still states  Dr Claiborne Billings to read _ ASV results unknown. Referred by Dr Debara Pickett who follows for atrial fib. Dr Curt Bears started amiodarone, but after 2-3 years she had to stop it. Amiodarone induced myopathy/ neuropathy.  ? ?She feels the mask is  not fitting he well at all. She wants to change providers for ASV- ? ?08-15-2021: [name] has felt that it was 4 times daily ropinirole and up to 4 times daily gabapentin she was able to get by with little clonazepam intake -maybe 50 tablets a year.   ?However nothing has come trolled her restless leg syndrome as well as clonazepam.  There is also the feeling that she still has medication induced sleepiness. She feels RLS were worse when she is seated and awake rather than in bed.  She does not suddenly fall asleep but she does feel fatigued and sleepy her on medication.  Her last 3 visit in his office were actually done with nurse practitioner Amy Lomax on 12/18/2020 on 3 6 and on 04-23-21. ? ?I will follow this patient after September 2023 for central apnea if she likes me to.  ? ? ? ? ?   ?Chief concern according to patient :   Holly Benson reports problems with insomnia created by restless legs, she has a feeling of having to rub or mesh her legs and she can hardly sit still.  She has undergone a femoral artery stent earlier this year so there was definitely a peripheral vascular disease, she had a humerus fracture on the right side in 2014 she underwent cataract surgeries and hysterectomy in the 1990s.  She has a longstanding history of hypertension, coronary artery and heart disease and atrial fibrillation she dates the onset of her cardiac problems to 2016.  She has been followed by Lyman Bishop, MD who referred her to the [place] lab after her atrial fibrillation diagnosis which is paroxysmal atrial fibrillation.  At the time she was already on Eliquis, chronically anticoagulated , has failed ca cannel blockers and toprol in rate  control-  taken  Amiodarone since . Dr Curt Bears recommneded medication changes -  Pravachol, Cozaar Microzide and Proventil inhaler.  She is taking Klonopin half milligram tablets. She had an unsuccesfull  Cardioversion-  ?The first sleep study at [facility] showed  a sleep onset after 21 minutes with a sleep efficiency of 73.5% study was performed on 12-27-2018.  She had leg related arousals at index of 5.8/h so periodic limb movements definitely contributed to poor sleep.  She was diagnosed with moderate obstructive sleep apnea an AHI of 16.8 which is still actually mild, severe when she resumed supine sleep position.  There were also some central apnea noted and I would continue to  see this as a complex sleep apnea rather than obstructive sleep apnea.  She returned for a CPAP titration which then had to switch to BiPAP modality on 2-28 2020.  Sleep efficiency improved.  She did not have periodic limb movements and related arousals as she was under CPAP and BiPAP.   ?Her EKG showed PVCs but no atrial fibrillation during the night.  CPAP had been initiated at 5 cm and was titrated up to 11 cmH2O when her AHI was 20.8/h.  BiPAP was then started at 12/8 and titrated to 14/10 but central apneas were still present and the AHI was still 52/h.  She was given a full facemask during the titration.  Since she failed to respond to CPAP and BiPAP she returned for an ASV titration addressing the residual central apneas.  And an ASV pressure of 5 over 4/15 cmH2O her AHI was reduced to 1.1.  Central apneas were eliminated, interestingly snoring is now reported again.  There were no clinically significant periodic limb movements noted.  The setting for the ASV machine is an EPAP of minimum 5 a pressure support of minimum 4 maximum 15 and a breeze rate was set on auto there is no set rate for breathing backup.  She started treatment in October 2020 so not even a year ago. ?She also has a diagnosis now of pulmonary hypertension which was not listed previously. ? ?She reports now that she still has restless legs keeping her from sleeping she has struggled with atrial fibrillation and hypertension over the past year again.  Multiple medication adjustments have not yet controlled her blood  pressure.  She does not have chest pain headaches or dizziness.  She has been fully vaccinated for Covid with 2 shots.     ? Family medical /sleep history: no  other family member on CPAP with OSA, insomnia, s

## 2022-02-13 NOTE — Progress Notes (Signed)
Faxed printed/signed rx gabapentin to [facility] at [phone]. Received fax confirmation. ?

## 2022-02-13 NOTE — Patient Instructions (Signed)
Restless Legs Syndrome ?Restless legs syndrome is a condition that causes uncomfortable feelings or sensations in the legs, especially while sitting or lying down. The sensations usually cause an overwhelming urge to move the legs. The arms can also sometimes be affected. ?The condition can range from mild to severe. The symptoms often interfere with a person's ability to sleep. ?What are the causes? ?The cause of this condition is not known. ?What increases the risk? ?The following factors may make you more likely to develop this condition: ?Being older than 50. ?Pregnancy. ?Being a woman. In general, the condition is more common in women than in men. ?A family history of the condition. ?Having iron deficiency. ?Overuse of caffeine, nicotine, or alcohol. ?Certain medical conditions, such as kidney disease, Parkinson's disease, or nerve damage. ?Certain medicines, such as those for high blood pressure, nausea, colds, allergies, depression, and some heart conditions. ?What are the signs or symptoms? ?The main symptom of this condition is uncomfortable sensations in the legs, such as: ?Pulling. ?Tingling. ?Prickling. ?Throbbing. ?Crawling. ?Burning. ?Usually, the sensations: ?Affect both sides of the body. ?Are worse when you sit or lie down. ?Are worse at night. These may make it difficult to fall asleep. ?Make you have a strong urge to move your legs. ?Are temporarily relieved by moving your legs or standing. ?The arms can also be affected, but this is rare. People who have this condition often have tiredness during the day because of their lack of sleep at night. ?How is this diagnosed? ?This condition may be diagnosed based on: ?Your symptoms. ?Blood tests. ?In some cases, you may be monitored in a sleep lab by a specialist (a sleep study). This can detect any disruptions in your sleep. ?How is this treated? ?This condition is treated by managing the symptoms. This may include: ?Lifestyle changes, such as  exercising, using relaxation techniques, and avoiding caffeine, alcohol, or tobacco. ?Iron supplements. ?Medicines. Parkinson's medications may be tried first. Anti-seizure medications can also be helpful. ?Follow these instructions at home: ?General instructions ?Take over-the-counter and prescription medicines only as told by your health care provider. ?Use methods to help relieve the uncomfortable sensations, such as: ?Massaging your legs. ?Walking or stretching. ?Taking a cold or hot bath. ?Keep all follow-up visits. This is important. ?Lifestyle ?  ?Practice good sleep habits. For example, go to bed and get up at the same time every day. Most adults should get 7-9 hours of sleep each night. ?Exercise regularly. Try to get at least 30 minutes of exercise most days of the week. ?Practice ways of relaxing, such as yoga or meditation. ?Avoid caffeine and alcohol. ?Do not use any products that contain nicotine or tobacco. These products include cigarettes, chewing tobacco, and vaping devices, such as e-cigarettes. If you need help quitting, ask your health care provider. ?Where to find more information ?National Institute of Neurological Disorders and Stroke: www.ninds.nih.gov ?Contact a health care provider if: ?Your symptoms get worse or they do not improve with treatment. ?Summary ?Restless legs syndrome is a condition that causes uncomfortable feelings or sensations in the legs, especially while sitting or lying down. ?The symptoms often interfere with your ability to sleep. ?This condition is treated by managing the symptoms. You may need to make lifestyle changes or take medicines. ?This information is not intended to replace advice given to you by your health care provider. Make sure you discuss any questions you have with your health care provider. ?Document Revised: 06/02/2021 Document Reviewed: 06/02/2021 ?Elsevier Patient Education ? 2022   [employer]. ?Atrial Fibrillation ?Atrial fibrillation is a type  of irregular or rapid heartbeat (arrhythmia). In atrial fibrillation, the top part of the heart (atria) beats in an irregular pattern. This makes the heart unable to pump blood normally and effectively. ?The goal of treatment is to prevent blood clots from forming, control your heart rate, or restore your heartbeat to a normal rhythm. If this condition is not treated, it can cause serious problems, such as a weakened heart muscle (cardiomyopathy) or a stroke. ?What are the causes? ?This condition is often caused by medical conditions that damage the heart's electrical system. These include: ?High blood pressure (hypertension). This is the most common cause. ?Certain heart problems or conditions, such as heart failure, coronary artery disease, heart valve problems, or heart surgery. ?Diabetes. ?Overactive thyroid (hyperthyroidism). ?Obesity. ?Chronic kidney disease. ?In some cases, the cause of this condition is not known. ?What increases the risk? ?This condition is more likely to develop in: ?Older people. ?People who smoke. ?Athletes who do endurance exercise. ?People who have a family history of atrial fibrillation. ?Men. ?People who use drugs. ?People who drink a lot of alcohol. ?People who have lung conditions, such as emphysema, pneumonia, or COPD. ?People who have obstructive sleep apnea. ?What are the signs or symptoms? ?Symptoms of this condition include: ?A feeling that your heart is racing or beating irregularly. ?Discomfort or pain in your chest. ?Shortness of breath. ?Sudden light-headedness or weakness. ?Tiring easily during exercise or activity. ?Fatigue. ?Syncope (fainting). ?Sweating. ?In some cases, there are no symptoms. ?How is this diagnosed? ?Your health care provider may detect atrial fibrillation when taking your pulse. If detected, this condition may be diagnosed with: ?An electrocardiogram (ECG) to check electrical signals of the heart. ?An ambulatory cardiac monitor to record your heart's  activity for a few days. ?A transthoracic echocardiogram (TTE) to create pictures of your heart. ?A transesophageal echocardiogram (TEE) to create even closer pictures of your heart. ?A stress test to check your blood supply while you exercise. ?Imaging tests, such as a CT scan or chest X-ray. ?Blood tests. ?How is this treated? ?Treatment depends on underlying conditions and how you feel when you experience atrial fibrillation. This condition may be treated with: ?Medicines to prevent blood clots or to treat heart rate or heart rhythm problems. ?Electrical cardioversion to reset the heart's rhythm. ?A pacemaker to correct abnormal heart rhythm. ?Ablation to remove the heart tissue that sends abnormal signals. ?Left atrial appendage closure to seal the area where blood clots can form. ?In some cases, underlying conditions will be treated. ?Follow these instructions at home: ?Medicines ?Take over-the counter and prescription medicines only as told by your health care provider. ?Do not take any new medicines without talking to your health care provider. ?If you are taking blood thinners: ?Talk with your health care provider before you take any medicines that contain aspirin or NSAIDs, such as ibuprofen. These medicines increase your risk for dangerous bleeding. ?Take your medicine exactly as told, at the same time every day. ?Avoid activities that could cause injury or bruising, and follow instructions about how to prevent falls. ?Wear a medical alert bracelet or carry a card that lists what medicines you take. ?Lifestyle ?  ?Do not use any products that contain nicotine or tobacco, such as cigarettes, e-cigarettes, and chewing tobacco. If you need help quitting, ask your health care provider. ?Eat heart-healthy foods. Talk with a dietitian to make an eating plan that is right for you. ?Exercise regularly as told  by your health care provider. ?Do not drink alcohol. ?Lose weight if you are overweight. ?Do not use  drugs, including cannabis. ?General instructions ?If you have obstructive sleep apnea, manage your condition as told by your health care provider. ?Do not use diet pills unless your health care provider approves. D

## 2022-02-17 ENCOUNTER — Encounter (HOSPITAL_COMMUNITY): Payer: Self-pay

## 2022-02-18 ENCOUNTER — Inpatient Hospital Stay (HOSPITAL_COMMUNITY)
Admission: AD | Admit: 2022-02-18 | Discharge: 2022-02-21 | DRG: 309 | Disposition: A | Discharge status: Home / Self Care | Payer: Medicare Other | Source: Ambulatory Visit | Attending: Cardiology | Admitting: Cardiology

## 2022-02-18 ENCOUNTER — Ambulatory Visit (HOSPITAL_COMMUNITY)
Admission: RE | Admit: 2022-02-18 | Discharge: 2022-02-18 | Disposition: A | Discharge status: Home / Self Care | Payer: Medicare Other | Source: Ambulatory Visit | Attending: Nurse Practitioner | Admitting: Nurse Practitioner

## 2022-02-18 ENCOUNTER — Other Ambulatory Visit: Payer: Self-pay

## 2022-02-18 ENCOUNTER — Telehealth: Payer: Self-pay

## 2022-02-18 ENCOUNTER — Other Ambulatory Visit (HOSPITAL_COMMUNITY): Payer: Self-pay

## 2022-02-18 ENCOUNTER — Other Ambulatory Visit (HOSPITAL_BASED_OUTPATIENT_CLINIC_OR_DEPARTMENT_OTHER): Payer: Medicare Other

## 2022-02-18 VITALS — BP 170/74 | HR 63 | Ht 62.0 in | Wt 139.2 lb

## 2022-02-18 DIAGNOSIS — Z7901 Long term (current) use of anticoagulants: Secondary | ICD-10-CM | POA: Diagnosis not present

## 2022-02-18 DIAGNOSIS — E785 Hyperlipidemia, unspecified: Secondary | ICD-10-CM | POA: Diagnosis present

## 2022-02-18 DIAGNOSIS — Z888 Allergy status to other drugs, medicaments and biological substances status: Secondary | ICD-10-CM

## 2022-02-18 DIAGNOSIS — Z79899 Other long term (current) drug therapy: Secondary | ICD-10-CM

## 2022-02-18 DIAGNOSIS — G2581 Restless legs syndrome: Secondary | ICD-10-CM | POA: Diagnosis present

## 2022-02-18 DIAGNOSIS — Z87891 Personal history of nicotine dependence: Secondary | ICD-10-CM

## 2022-02-18 DIAGNOSIS — I4892 Unspecified atrial flutter: Secondary | ICD-10-CM | POA: Diagnosis present

## 2022-02-18 DIAGNOSIS — Z823 Family history of stroke: Secondary | ICD-10-CM

## 2022-02-18 DIAGNOSIS — I480 Paroxysmal atrial fibrillation: Secondary | ICD-10-CM

## 2022-02-18 DIAGNOSIS — J449 Chronic obstructive pulmonary disease, unspecified: Secondary | ICD-10-CM | POA: Diagnosis present

## 2022-02-18 DIAGNOSIS — Z8249 Family history of ischemic heart disease and other diseases of the circulatory system: Secondary | ICD-10-CM

## 2022-02-18 DIAGNOSIS — M1990 Unspecified osteoarthritis, unspecified site: Secondary | ICD-10-CM | POA: Diagnosis present

## 2022-02-18 DIAGNOSIS — D6869 Other thrombophilia: Secondary | ICD-10-CM | POA: Diagnosis present

## 2022-02-18 DIAGNOSIS — I4819 Other persistent atrial fibrillation: Secondary | ICD-10-CM | POA: Diagnosis present

## 2022-02-18 DIAGNOSIS — M810 Age-related osteoporosis without current pathological fracture: Secondary | ICD-10-CM | POA: Diagnosis present

## 2022-02-18 DIAGNOSIS — I10 Essential (primary) hypertension: Secondary | ICD-10-CM | POA: Diagnosis present

## 2022-02-18 DIAGNOSIS — I4891 Unspecified atrial fibrillation: Principal | ICD-10-CM | POA: Diagnosis present

## 2022-02-18 DIAGNOSIS — G4733 Obstructive sleep apnea (adult) (pediatric): Secondary | ICD-10-CM | POA: Diagnosis present

## 2022-02-18 DIAGNOSIS — K5909 Other constipation: Secondary | ICD-10-CM | POA: Diagnosis present

## 2022-02-18 LAB — BASIC METABOLIC PANEL
Anion gap: 7 (ref 5–15)
BUN: 18 mg/dL (ref 8–23)
CO2: 28 mmol/L (ref 22–32)
Calcium: 9.1 mg/dL (ref 8.9–10.3)
Chloride: 102 mmol/L (ref 98–111)
Creatinine, Ser: 0.78 mg/dL (ref 0.44–1.00)
GFR, Estimated: >60 mL/min (ref >60)
Glucose, Bld: 100 mg/dL — ABNORMAL HIGH (ref 70–99)
Potassium: 4.7 mmol/L (ref 3.5–5.1)
Sodium: 137 mmol/L (ref 135–145)

## 2022-02-18 LAB — MAGNESIUM: Magnesium: 2.4 mg/dL (ref 1.7–2.4)

## 2022-02-18 MED ORDER — FUROSEMIDE 20 MG PO TABS: ORAL_TABLET | ORAL | Status: DC

## 2022-02-18 MED ORDER — DILTIAZEM HCL ER 60 MG PO CP12
60.0000 mg | ORAL_CAPSULE | Freq: Two times a day (BID) | ORAL | Status: DC
  Administered 2022-02-18 – 2022-02-21 (×6): 60 mg via ORAL
  Filled 2022-02-18 (×6): qty 1

## 2022-02-18 MED ORDER — GABAPENTIN 100 MG PO CAPS
100.0000 mg | ORAL_CAPSULE | Freq: Every morning | ORAL | Status: DC
  Administered 2022-02-19 – 2022-02-21 (×3): 100 mg via ORAL
  Filled 2022-02-18 (×3): qty 1

## 2022-02-18 MED ORDER — SODIUM CHLORIDE 0.9 % IV SOLN: 250.0000 mL | INTRAVENOUS | Status: DC | PRN

## 2022-02-18 MED ORDER — ROPINIROLE HCL 0.5 MG PO TABS: 0.2500 mg | ORAL_TABLET | Freq: Two times a day (BID) | ORAL | Status: DC

## 2022-02-18 MED ORDER — UMECLIDINIUM BROMIDE 62.5 MCG/ACT IN AEPB
1.0000 | INHALATION_SPRAY | Freq: Every day | RESPIRATORY_TRACT | Status: DC
  Administered 2022-02-19 – 2022-02-21 (×3): 1 via RESPIRATORY_TRACT
  Filled 2022-02-18: qty 7

## 2022-02-18 MED ORDER — CARVEDILOL 6.25 MG PO TABS
6.2500 mg | ORAL_TABLET | Freq: Two times a day (BID) | ORAL | Status: DC
  Administered 2022-02-18 – 2022-02-21 (×6): 6.25 mg via ORAL
  Filled 2022-02-18 (×7): qty 1

## 2022-02-18 MED ORDER — CLONAZEPAM 0.5 MG PO TABS: 0.5000 mg | ORAL_TABLET | Freq: Every evening | ORAL | Status: DC | PRN

## 2022-02-18 MED ORDER — ARFORMOTEROL TARTRATE 15 MCG/2ML IN NEBU
15.0000 ug | INHALATION_SOLUTION | Freq: Two times a day (BID) | RESPIRATORY_TRACT | Status: DC
  Administered 2022-02-19 – 2022-02-21 (×5): 15 ug via RESPIRATORY_TRACT
  Filled 2022-02-18 (×5): qty 2

## 2022-02-18 MED ORDER — IRBESARTAN 75 MG PO TABS: 75.0000 mg | ORAL_TABLET | Freq: Every day | ORAL | Status: DC

## 2022-02-18 MED ORDER — PRESERVISION AREDS 2 PO CAPS: ORAL_CAPSULE | Freq: Two times a day (BID) | ORAL | Status: DC

## 2022-02-18 MED ORDER — ROPINIROLE HCL 0.5 MG PO TABS
0.2500 mg | ORAL_TABLET | Freq: Every morning | ORAL | Status: DC
  Administered 2022-02-19 – 2022-02-21 (×3): 0.25 mg via ORAL
  Filled 2022-02-18 (×3): qty 1

## 2022-02-18 MED ORDER — ROPINIROLE HCL 0.5 MG PO TABS
0.7500 mg | ORAL_TABLET | Freq: Every day | ORAL | Status: DC
  Administered 2022-02-18 – 2022-02-20 (×3): 0.75 mg via ORAL
  Filled 2022-02-18 (×3): qty 2

## 2022-02-18 MED ORDER — DOFETILIDE 250 MCG PO CAPS
250.0000 ug | ORAL_CAPSULE | Freq: Two times a day (BID) | ORAL | Status: DC
  Administered 2022-02-18 – 2022-02-21 (×6): 250 ug via ORAL
  Filled 2022-02-18 (×6): qty 1

## 2022-02-18 MED ORDER — IRBESARTAN 75 MG PO TABS
75.0000 mg | ORAL_TABLET | Freq: Every day | ORAL | Status: DC
  Administered 2022-02-18 – 2022-02-21 (×4): 75 mg via ORAL
  Filled 2022-02-18 (×4): qty 1

## 2022-02-18 MED ORDER — SODIUM CHLORIDE 0.9% FLUSH
3.0000 mL | Freq: Two times a day (BID) | INTRAVENOUS | Status: DC
  Administered 2022-02-18 – 2022-02-21 (×5): 3 mL via INTRAVENOUS

## 2022-02-18 MED ORDER — GABAPENTIN 100 MG PO CAPS: 100.0000 mg | ORAL_CAPSULE | Freq: Two times a day (BID) | ORAL | Status: DC

## 2022-02-18 MED ORDER — SODIUM CHLORIDE 0.9% FLUSH: 3.0000 mL | INTRAVENOUS | Status: DC | PRN

## 2022-02-18 MED ORDER — APIXABAN 5 MG PO TABS
5.0000 mg | ORAL_TABLET | Freq: Two times a day (BID) | ORAL | Status: DC
  Administered 2022-02-18 – 2022-02-21 (×6): 5 mg via ORAL
  Filled 2022-02-18 (×6): qty 1

## 2022-02-18 MED ORDER — ACETAMINOPHEN 325 MG PO TABS: 650.0000 mg | ORAL_TABLET | Freq: Four times a day (QID) | ORAL | Status: DC | PRN

## 2022-02-18 MED ORDER — GABAPENTIN 100 MG PO CAPS
200.0000 mg | ORAL_CAPSULE | Freq: Every day | ORAL | Status: DC
  Administered 2022-02-18 – 2022-02-20 (×3): 200 mg via ORAL
  Filled 2022-02-18 (×3): qty 2

## 2022-02-18 NOTE — TOC Benefit Eligibility Note (Signed)
Patient Advocate Encounter ? ?Insurance verification completed.   ? ?The patient is currently admitted and upon discharge could be taking dofetilide (Tikosyn) 250 mcg capsules. ? ?The current 30 day co-pay is, $47.72.  ? ?The patient is insured through [employer]  ? ?Lyndel Safe, CPhT ?Pharmacy Patient Advocate Specialist ?[facility] Patient Advocate Team ?Direct Number: [phone]  Fax: [phone] ? ? ? ? ? ?  ?

## 2022-02-18 NOTE — Care Management (Signed)
?  Transition of Care (TOC) Screening Note ? ? ?Patient Details  ?Name: Holly Benson ?Date of Birth: 01/29/1933 ? ? ?Transition of Care (TOC) CM/SW Contact:    ?Graves-Bigelow, Ocie Cornfield, RN ?Phone Number: ?02/18/2022, 3:44 PM ? ? ? ?Transition of Care Department [facility]) has reviewed the patient. Patient presented for Tikosyn Load. Benefits check submitted for cost. Case Manager will follow for cost and pharmacies of choice.  ?

## 2022-02-18 NOTE — Progress Notes (Signed)
? ? ?Primary Care Physician: Michael Boston, MD ?Primary Cardiologist: Dr Debara Pickett ?Primary Electrophysiologist: Dr Curt Bears ?Referring Physician: Dr Curt Bears ? ? ?Holly Benson is a 88 y.o. female with a history of HTN, atrial flutter, HLD, OSA, COPD, PAD, and atrial fibrillation who presents for follow up in the [facility]. She was previously on amiodarone but this was discontinued due to concern about possible lung toxicity. She states that over the last few weeks she has had frequent "fluttering" sensations in her chest which last 10-15 minutes but can happen multiple times per day. She states that this feels "totally different" from her afib in the past. She denies any other symptoms. Patient is on Eliquis for a CHADS2VASC score of 5. ? ?[name] for afib. Pt has been seen by  [name], [place] , 12/8  and [name], 12/6. When she was seen last by [name], July 2022, tikosyn was discussed but pt was not ready to commit. She was started on more rate control on last visit with [name], and eliquis was put back at '5mg'$  bid as she had gained weight. She is planning to go to [facility] for the month of January. She in interested in [place] when she returns in February. She is rate controlled. Weight is stable.   ? ?Today, she denies symptoms of chest pain, shortness of breath, orthopnea, PND, lower extremity edema, dizziness, presyncope, syncope, snoring, daytime somnolence, bleeding, or neurologic sequela. The patient is tolerating medications without difficulties and is otherwise without complaint today.  ? ?F/u in afib clinic 02/18/22 for Tikosyn admit. She is in SR today. She states that she is going in and out since stopping amiodarone last spring. No benadryl use, no missed anticoagulation. Qtc is stable at 407 ms.  ? ? ?Atrial Fibrillation Risk Factors: ? ?she does have symptoms or diagnosis of sleep apnea. ?she is compliant with CPAP therapy. ?she  does not have a history of rheumatic fever. ? ? ?she has a BMI of Body mass index is 25.46 kg/m?Marland KitchenMarland Kitchen ?Filed Weights  ? 02/18/22 1001  ?Weight: 63.1 kg  ? ? ?Family History  ?Problem Relation Age of Onset  ? Suicidality Father   ? Stroke Maternal Grandfather   ? Hypertension Sister   ? Colon cancer Neg Hx   ? Esophageal cancer Neg Hx   ? Pancreatic cancer Neg Hx   ? Stomach cancer Neg Hx   ? Liver disease Neg Hx   ? ? ? ?Atrial Fibrillation Management history: ? ?Previous antiarrhythmic drugs: flecainide, amiodarone  ?Previous cardioversions: none ?Previous ablations: none ?CHADS2VASC score: 5 ?Anticoagulation history: Eliquis ? ? ?Past Medical History:  ?Diagnosis Date  ? Arrhythmia   ? Arthritis   ? Atrial fibrillation ([place])   ? Constipation   ? chronic  ? COPD (chronic obstructive pulmonary disease) ([place])   ? Hyperlipidemia   ? Hypertension   ? pulmonary  ? OSA on CPAP   ? Osteoporosis   ? RLS (restless legs syndrome)   ? Sleep apnea   ? ?Past Surgical History:  ?Procedure Laterality Date  ? ABDOMINAL AORTOGRAM W/LOWER EXTREMITY N/A 03/21/2020  ? Procedure: ABDOMINAL AORTOGRAM W/ Bilateral LOWER EXTREMITY Runoff;  Surgeon: Wellington Hampshire, MD;  Location: [facility];  Service: Cardiovascular;  Laterality: N/A;  ? ABDOMINAL HYSTERECTOMY    ? arm surgery Right   ? Broken arm and has a plate in it  ? CATARACT EXTRACTION  Bilateral   ? COLONOSCOPY    ? More than 10 years ago In [facility]  ? KNEE ARTHROSCOPY Right   ? PERIPHERAL VASCULAR INTERVENTION Bilateral 03/21/2020  ? Procedure: PERIPHERAL VASCULAR INTERVENTION;  Surgeon: Wellington Hampshire, MD;  Location: [facility];  Service: Cardiovascular;  Laterality: Bilateral;  external iliac  ? ? ?Current Outpatient Medications  ?Medication Sig Dispense Refill  ? acetaminophen (TYLENOL) 325 MG tablet Take 650 mg by mouth as needed for moderate pain or headache.    ? albuterol (VENTOLIN HFA) 108 (90 Base) MCG/ACT inhaler Inhale 2 puffs into the lungs every 6  (six) hours as needed for wheezing or shortness of breath. 18 g 6  ? apixaban (ELIQUIS) 5 MG TABS tablet Take 1 tablet (5 mg total) by mouth 2 (two) times daily. 180 tablet 1  ? Carboxymethylcellulose Sodium (THERATEARS OP) Place 1 drop into both eyes daily as needed (dry eyes).    ? carvedilol (COREG) 6.25 MG tablet Take 1 tablet (6.25 mg total) by mouth 2 (two) times daily. 180 tablet 3  ? cetirizine (ZYRTEC) 10 MG tablet Take 10 mg by mouth daily as needed for allergies.     ? clonazePAM (KLONOPIN) 0.5 MG tablet TAKE 1 TABLET AT BEDTIME ASNEEDED FOR RLS 30 tablet 1  ? diltiazem (CARDIZEM SR) 60 MG 12 hr capsule Take 1 capsule (60 mg total) by mouth 2 (two) times daily. 30 capsule 6  ? gabapentin (NEURONTIN) 100 MG capsule Take 1 capsule by mouth in the morning and 2 capsules in the evening. 90 capsule 5  ? irbesartan (AVAPRO) 75 MG tablet Take 1 tablet (75 mg total) by mouth daily. 90 tablet 3  ? Misc Natural Products (COLON CLEANSE) CAPS Take 4 capsules by mouth as needed (constipation).    ? Multiple Vitamins-Minerals (PRESERVISION AREDS 2 PO) Take 1 tablet by mouth in the morning and at bedtime.    ? polyethylene glycol (MIRALAX / GLYCOLAX) 17 g packet Take 17 g by mouth daily as needed.    ? rOPINIRole (REQUIP) 0.25 MG tablet Take 1 tablet in the morning and three tablets after dinner po 360 tablet 3  ? STIOLTO RESPIMAT 2.5-2.5 MCG/ACT AERS USE 2 INHALATIONS ORALLY   EVERY MORNING 12 g 1  ? zinc gluconate 50 MG tablet Take 50 mg by mouth daily.    ? furosemide (LASIX) 20 MG tablet Take one tablet by mouth on Monday and Friday    ? ?No current facility-administered medications for this encounter.  ? ? ?Allergies  ?Allergen Reactions  ? Lovenox [Enoxaparin Sodium] Hives and Rash  ?  PT STATES SHE BROKE OUT IN A RASH HEAD TO TOE AND LASTED ABOUT 3 WEEKS   ? Metoprolol Succinate [Metoprolol] Other (See Comments)  ?  Muscle aches, hand pain, and tingling  ? ? ?Social History  ? ?Socioeconomic History  ? Marital  status: Widowed  ?  Spouse name: Not on file  ? Number of children: 3  ? Years of education: Not on file  ? Highest education level: Not on file  ?Occupational History  ? Occupation: retired  ?  Comment: retired Licensed conveyancer  ?Tobacco Use  ? Smoking status: Former  ?  Packs/day: 1.50  ?  Years: 30.00  ?  Pack years: 45.00  ?  Types: Cigarettes  ?  Start date: 48  ?  Quit date: 11/04/1991  ?  Years since quitting: 30.3  ? Smokeless tobacco: Never  ?Vaping Use  ?  Vaping Use: Never used  ?Substance and Sexual Activity  ? Alcohol use: Yes  ?  Alcohol/week: 1.0 standard drink  ?  Types: 1 Glasses of wine per week  ?  Comment: nightly  ? Drug use: No  ? Sexual activity: Not Currently  ?Other Topics Concern  ? Not on file  ?Social History Narrative  ? Lives with daughter  ? ?Social Determinants of Health  ? ?Financial Resource Strain: Not on file  ?Food Insecurity: Not on file  ?Transportation Needs: Not on file  ?Physical Activity: Not on file  ?Stress: Not on file  ?Social Connections: Not on file  ?Intimate Partner Violence: Not on file  ? ? ? ?ROS- All systems are reviewed and negative except as per the HPI above. ? ?Physical Exam: ?Vitals:  ? 02/18/22 1001  ?BP: (!) 170/74  ?Pulse: 63  ?Weight: 63.1 kg  ?Height: '5\' 2"'$  (1.575 m)  ? ? ?GEN- The patient is a well appearing elderly female, alert and oriented x 3 today.   ?Head- normocephalic, atraumatic ?Eyes-  Sclera clear, conjunctiva pink ?Ears- hearing intact ?Oropharynx- clear ?Neck- supple  ?Lungs- Clear to ausculation bilaterally, normal work of breathing ?Heart- irregular rate and rhythm, no murmurs, rubs or gallops  ?GI- soft, NT, ND, + BS ?Extremities- no clubbing, cyanosis, or edema ?MS- no significant deformity or atrophy ?Skin- no rash or lesion ?Psych- euthymic mood, full affect ?Neuro- strength and sensation are intact ? ?Wt Readings from Last 3 Encounters:  ?02/18/22 63.1 kg  ?02/13/22 62.1 kg  ?01/21/22 61.8 kg  ? ? ?EKG today demonstrates  ?Atrial flutter  at 88 bpm ? ?QRS duration 90 ms ?QT/QTcB 362/438  ms ? ?Echo 09/26/20 demonstrated  ?1. Left ventricular ejection fraction, by estimation, is 60 to 65%. The  ?left ventricle has normal function. The left

## 2022-02-18 NOTE — Progress Notes (Addendum)
Pharmacy: Dofetilide (Tikosyn) - Initial Consult ?Assessment and Electrolyte Replacement ? ?Pharmacy consulted to assist in monitoring and replacing electrolytes in this 88 y.o. female admitted on 02/18/2022 undergoing dofetilide initiation. First dofetilide dose: 02/18/22 pm ? ?Assessment: ? ?Patient Exclusion Criteria: If any screening criteria checked as "Yes", then  patient  should NOT receive dofetilide until criteria item is corrected.  ?If ?Yes? please indicate correction plan. ? ?YES  NO Patient  Exclusion Criteria Correction Plan  ? ?'[]'$   ?'[x]'$   ?Baseline QTc interval is greater than or equal to 440 msec. ?IF above YES box checked dofetilide contraindicated unless patient has ICD; then may proceed if QTc 500-550 msec or with known ventricular conduction abnormalities may proceed with QTc 550-600 msec. ?QTc = 0.41   ? ?'[]'$   ?'[x]'$   ?Patient is known or suspected to have a digoxin level greater than 2 ng/ml: ?No results found for: DIGOXIN ?   ? ?'[]'$   ?'[x]'$   ?Creatinine clearance less than 20 ml/min (calculated using Cockcroft-Gault, actual body weight and serum creatinine): ?Estimated Creatinine Clearance: 44.1 mL/min (by C-G formula based on SCr of 0.78 mg/dL). ?   ? ?'[]'$   ?'[x]'$  Patient has received drugs known to prolong the QT intervals within the last 48 hours (phenothiazines, tricyclics or tetracyclic antidepressants, erythromycin, H-1 antihistamines, cisapride, fluoroquinolones, azithromycin, ondansetron).  ? ?Updated information on QT prolonging agents is available to be searched on the following database:QT prolonging agents    ? ?'[]'$   ?'[x]'$   ?Patient received a dose of hydrochlorothiazide (Oretic) alone or in any combination including triamterene (Dyazide, Maxzide) in the last 48 hours. Last HCTZ dose 02/13/22 - pt aware she can not continue at discharge  ? ?'[]'$   ?'[x]'$  Patient received a medication known to increase dofetilide plasma concentrations prior to initial dofetilide dose:  ?Trimethoprim (Primsol,  Proloprim) in the last 36 hours ?Verapamil (Calan, Verelan) in the last 36 hours or a sustained release dose in the last 72 hours ?Megestrol (Megace) in the last 5 days  ?Cimetidine (Tagamet) in the last 6 hours ?Ketoconazole (Nizoral) in the last 24 hours ?Itraconazole (Sporanox) in the last 48 hours  ?Prochlorperazine (Compazine) in the last 36 hours ?   ? ?'[]'$   ?'[x]'$   ?Patient is known to have a history of torsades de pointes; congenital or acquired long QT syndromes.   ? ?'[]'$   ?'[x]'$   ?Patient has received a Class 1 antiarrhythmic with less than 2 half-lives since last dose. ?(Disopyramide, Quinidine, Procainamide, Lidocaine, Mexiletine, Flecainide, Propafenone)   ? ?'[]'$   ?'[x]'$   ?Patient has received amiodarone therapy in the past 3 months or amiodarone level is greater than 0.3 ng/ml.   ? ?Patient has been appropriately anticoagulated with apixaban '5mg'$  BID. ? ?Labs: ?   ?Component Value Date/Time  ? K 4.7 02/18/2022 1021  ? MG 2.4 02/18/2022 1021  ?  ? ?Plan: ?Potassium: ?K >/= 4: Appropriate to initiate Tikosyn, no replacement needed   ? ?Magnesium: ?Mg >2: Appropriate to initiate Tikosyn, no replacement needed   ? ? ?Thank you for allowing pharmacy to participate in this patient's care  ? ?Einar Grad ?02/18/2022  2:46 PM ? ?

## 2022-02-18 NOTE — Telephone Encounter (Signed)
Received a refill request from the patients pharmacy for her Coreg. Came into the patients chart to refill her medication and seen that the patient is currently admitted to the hospital. At this time I will neither refill no decline this medication refill.  ?

## 2022-02-18 NOTE — Plan of Care (Signed)

## 2022-02-18 NOTE — H&P (Addendum)
?Electrophysiology H&P  Note  ? ? ?Primary Care Physician: Michael Boston, MD ?Primary Cardiologist: Dr Debara Pickett ?Primary Electrophysiologist: Dr Curt Bears ?Referring Physician: Dr Curt Bears ? ? ?Holly Benson is a 88 y.o. female with a history of HTN, atrial flutter, HLD, OSA, COPD, PAD, and atrial fibrillation who presents for follow up in the [facility]. She was previously on amiodarone but this was discontinued due to concern about possible lung toxicity. She states that over the last few weeks she has had frequent "fluttering" sensations in her chest which last 10-15 minutes but can happen multiple times per day. She states that this feels "totally different" from her afib in the past. She denies any other symptoms. Patient is on Eliquis for a CHADS2VASC score of 5. ? ?[name] for afib. Pt has been seen by  [name], [place] , 12/8  and [name], 12/6. When she was seen last by [name], July 2022, tikosyn was discussed but pt was not ready to commit. She was started on more rate control on last visit with [name], and eliquis was put back at '5mg'$  bid as she had gained weight. She is planning to go to [facility] for the month of January. She in interested in [place] when she returns in February. She is rate controlled. Weight is stable.   ? ?Today, she denies symptoms of chest pain, shortness of breath, orthopnea, PND, lower extremity edema, dizziness, presyncope, syncope, snoring, daytime somnolence, bleeding, or neurologic sequela. The patient is tolerating medications without difficulties and is otherwise without complaint today.  ? ?F/u in afib clinic 02/18/22 for Tikosyn admit. She is in SR today. She states that she is going in and out since stopping amiodarone last spring. No benadryl use, no missed anticoagulation. Qtc is stable at 407 ms.  ? ? ?Atrial Fibrillation Risk Factors: ? ?she does have symptoms or diagnosis of sleep apnea. ?she is  compliant with CPAP therapy. ?she does not have a history of rheumatic fever. ? ? ?she has a BMI of There is no height or weight on file to calculate BMI.Marland Kitchen ?There were no vitals filed for this visit. ? ? ?Family History  ?Problem Relation Age of Onset  ? Suicidality Father   ? Stroke Maternal Grandfather   ? Hypertension Sister   ? Colon cancer Neg Hx   ? Esophageal cancer Neg Hx   ? Pancreatic cancer Neg Hx   ? Stomach cancer Neg Hx   ? Liver disease Neg Hx   ? ? ? ?Atrial Fibrillation Management history: ? ?Previous antiarrhythmic drugs: flecainide, amiodarone  ?Previous cardioversions: none ?Previous ablations: none ?CHADS2VASC score: 5 ?Anticoagulation history: Eliquis ? ? ?Past Medical History:  ?Diagnosis Date  ? Arrhythmia   ? Arthritis   ? Atrial fibrillation ([place])   ? Constipation   ? chronic  ? COPD (chronic obstructive pulmonary disease) ([place])   ? Hyperlipidemia   ? Hypertension   ? pulmonary  ? OSA on CPAP   ? Osteoporosis   ? RLS (restless legs syndrome)   ? Sleep apnea   ? ?Past Surgical History:  ?Procedure Laterality Date  ? ABDOMINAL AORTOGRAM W/LOWER EXTREMITY N/A 03/21/2020  ? Procedure: ABDOMINAL AORTOGRAM W/ Bilateral LOWER EXTREMITY Runoff;  Surgeon: Wellington Hampshire, MD;  Location: [facility];  Service: Cardiovascular;  Laterality: N/A;  ? ABDOMINAL HYSTERECTOMY    ? arm surgery Right   ? Broken arm and has a  plate in it  ? CATARACT EXTRACTION Bilateral   ? COLONOSCOPY    ? More than 10 years ago [facility]  ? KNEE ARTHROSCOPY Right   ? PERIPHERAL VASCULAR INTERVENTION Bilateral 03/21/2020  ? Procedure: PERIPHERAL VASCULAR INTERVENTION;  Surgeon: Wellington Hampshire, MD;  Location: [facility];  Service: Cardiovascular;  Laterality: Bilateral;  external iliac  ? ? ?No current facility-administered medications for this encounter.  ? ? ?Allergies  ?Allergen Reactions  ? Lovenox [Enoxaparin Sodium] Hives and Rash  ?  PT STATES SHE BROKE OUT IN A RASH HEAD TO TOE AND LASTED ABOUT  3 WEEKS   ? Metoprolol Succinate [Metoprolol] Other (See Comments)  ?  Muscle aches, hand pain, and tingling  ? ? ?Social History  ? ?Socioeconomic History  ? Marital status: Widowed  ?  Spouse name: Not on file  ? Number of children: 3  ? Years of education: Not on file  ? Highest education level: Not on file  ?Occupational History  ? Occupation: retired  ?  Comment: retired Licensed conveyancer  ?Tobacco Use  ? Smoking status: Former  ?  Packs/day: 1.50  ?  Years: 30.00  ?  Pack years: 45.00  ?  Types: Cigarettes  ?  Start date: 52  ?  Quit date: 11/04/1991  ?  Years since quitting: 30.3  ? Smokeless tobacco: Never  ?Vaping Use  ? Vaping Use: Never used  ?Substance and Sexual Activity  ? Alcohol use: Yes  ?  Alcohol/week: 1.0 standard drink  ?  Types: 1 Glasses of wine per week  ?  Comment: nightly  ? Drug use: No  ? Sexual activity: Not Currently  ?Other Topics Concern  ? Not on file  ?Social History Narrative  ? Lives with daughter  ? ?Social Determinants of Health  ? ?Financial Resource Strain: Not on file  ?Food Insecurity: Not on file  ?Transportation Needs: Not on file  ?Physical Activity: Not on file  ?Stress: Not on file  ?Social Connections: Not on file  ?Intimate Partner Violence: Not on file  ? ? ? ?ROS- All systems are reviewed and negative except as per the HPI above. ? ?Physical Exam: ?   ?Vitals:  ?  02/18/22 1001  ?BP: (!) 170/74  ?Pulse: 63  ?Weight: 63.1 kg  ?Height: '5\' 2"'$  (1.575 m)  ? ? ? ?GEN- The patient is a well appearing elderly female, alert and oriented x 3 today.   ?Head- normocephalic, atraumatic ?Eyes-  Sclera clear, conjunctiva pink ?Ears- hearing intact ?Oropharynx- clear ?Neck- supple  ?Lungs- Clear to ausculation bilaterally, normal work of breathing ?Heart- irregular rate and rhythm, no murmurs, rubs or gallops  ?GI- soft, NT, ND, + BS ?Extremities- no clubbing, cyanosis, or edema ?MS- no significant deformity or atrophy ?Skin- no rash or lesion ?Psych- euthymic mood, full affect ?Neuro-  strength and sensation are intact ? ?Wt Readings from Last 3 Encounters:  ?02/18/22 63.1 kg  ?02/13/22 62.1 kg  ?01/21/22 61.8 kg  ? ? ?EKG today demonstrates  ?Atrial flutter at 88 bpm ? ?QRS duration 90 ms ?QT/QTcB 362/438  ms ? ?Echo 09/26/20 demonstrated  ?1. Left ventricular ejection fraction, by estimation, is 60 to 65%. The  ?left ventricle has normal function. The left ventricle has no regional  ?wall motion abnormalities. Left ventricular diastolic parameters are  ?indeterminate.  ? 2. Right ventricular systolic function is normal. The right ventricular  ?size is normal. There is moderately elevated pulmonary artery systolic  ?  pressure. The estimated right ventricular systolic pressure is 97.9 mmHg.  ? 3. Left atrial size was severely dilated.  ? 4. Right atrial size was severely dilated.  ? 5. The mitral valve is normal in structure. Trivial mitral valve  ?regurgitation. No evidence of mitral stenosis.  ? 6. Tricuspid valve regurgitation is moderate.  ? 7. The aortic valve is tricuspid. Aortic valve regurgitation is trivial.  ?Mild to moderate aortic valve sclerosis/calcification is present, without  ?any evidence of aortic stenosis.  ? 8. The inferior vena cava is normal in size with greater than 50%  ?respiratory variability, suggesting right atrial pressure of 3 mmHg.  ? ?Epic records are reviewed at length today ? ?CHA2DS2-VASc Score =  4 ?The patient's score is based upon: ?  ?Echo- 10/22/21-  1. Left ventricular ejection fraction, by estimation, is 65 to 70%. The  ?left ventricle has normal function. The left ventricle has no regional  ?wall motion abnormalities. Left ventricular diastolic function could not  ?be evaluated. The average left  ?ventricular global longitudinal strain is -10.5 %. The global longitudinal  ?strain is abnormal.  ? 2. Right ventricular systolic function is moderately reduced. The right  ?ventricular size is moderately enlarged. There is mildly elevated  ?pulmonary artery  systolic pressure. The estimated right ventricular  ?systolic pressure is 89.2 mmHg.  ? 3. Left atrial size was severely dilated.  ? 4. Right atrial size was severely dilated.  ? 5. The mitral valve is normal

## 2022-02-19 DIAGNOSIS — I4819 Other persistent atrial fibrillation: Secondary | ICD-10-CM

## 2022-02-19 LAB — BASIC METABOLIC PANEL
Anion gap: 7 (ref 5–15)
BUN: 18 mg/dL (ref 8–23)
CO2: 25 mmol/L (ref 22–32)
Calcium: 8.8 mg/dL — ABNORMAL LOW (ref 8.9–10.3)
Chloride: 104 mmol/L (ref 98–111)
Creatinine, Ser: 0.81 mg/dL (ref 0.44–1.00)
GFR, Estimated: >60 mL/min (ref >60)
Glucose, Bld: 94 mg/dL (ref 70–99)
Potassium: 4 mmol/L (ref 3.5–5.1)
Sodium: 136 mmol/L (ref 135–145)

## 2022-02-19 LAB — MAGNESIUM: Magnesium: 2.1 mg/dL (ref 1.7–2.4)

## 2022-02-19 NOTE — Progress Notes (Signed)
Pt self administers home CPAP. 

## 2022-02-19 NOTE — Progress Notes (Addendum)
? ?Electrophysiology Rounding Note ? ?Patient Name: Holly Benson ?Date of Encounter: 02/19/2022 ? ?Primary Cardiologist: Holly Casino, MD  ?Electrophysiologist: Holly Mahaffy Meredith Leeds, MD  ? ? ?Subjective  ? ?Pt  remains in NSR  on Tikosyn 250 mcg BID  ? ?QTc from EKG last pm shows stable QTc at ~440 ? ?The patient is doing well today.  At this time, the patient denies chest pain, shortness of breath, or any new concerns. ? ?Inpatient Medications  ?  ?Scheduled Meds: ? apixaban  5 mg Oral BID  ? arformoterol  15 mcg Nebulization BID  ? And  ? umeclidinium bromide  1 puff Inhalation Daily  ? carvedilol  6.25 mg Oral BID  ? diltiazem  60 mg Oral BID  ? dofetilide  250 mcg Oral BID  ? gabapentin  100 mg Oral q AM  ? gabapentin  200 mg Oral QHS  ? irbesartan  75 mg Oral Daily  ? rOPINIRole  0.25 mg Oral q AM  ? rOPINIRole  0.75 mg Oral QHS  ? sodium chloride flush  3 mL Intravenous Q12H  ? ?Continuous Infusions: ? sodium chloride    ? ?PRN Meds: ?sodium chloride, acetaminophen, clonazePAM, sodium chloride flush  ? ?Vital Signs  ?  ?Vitals:  ? 02/18/22 1450 02/18/22 2048 02/18/22 2355  ?BP: (!) 138/57 (!) 145/61 (!) 132/51  ?Pulse: 66 66 68  ?Resp: '20 18 16  '$ ?Temp: 97.6 ?F (36.4 ?C) 98.7 ?F (37.1 ?C) 98.6 ?F (37 ?C)  ?TempSrc: Oral Oral Oral  ?Weight: 61.2 kg    ?Height: '5\' 2"'$  (1.575 m)    ? ?No intake or output data in the 24 hours ending 02/19/22 0709 ?Filed Weights  ? 02/18/22 1450  ?Weight: 61.2 kg  ? ? ?Physical Exam  ?  ?GEN- The patient is well appearing, alert and oriented x 3 today.   ?Head- normocephalic, atraumatic ?Eyes-  Sclera clear, conjunctiva pink ?Ears- hearing intact ?Oropharynx- clear ?Neck- supple ?Lungs- Clear to ausculation bilaterally, normal work of breathing ?Heart- Regular rate and rhythm, no murmurs, rubs or gallops ?GI- soft, NT, ND, + BS ?Extremities- no clubbing, cyanosis, or edema ?Skin- no rash or lesion ?Psych- euthymic mood, full affect ?Neuro- strength and sensation are  intact ? ?Labs  ?  ?CBC ?No results for input(s): WBC, NEUTROABS, HGB, HCT, MCV, PLT in the last 72 hours. ?Basic Metabolic Panel ?Recent Labs  ?  02/18/22 ?1021 02/19/22 ?0248  ?NA 137 136  ?K 4.7 4.0  ?CL 102 104  ?CO2 28 25  ?GLUCOSE 100* 94  ?BUN 18 18  ?CREATININE 0.78 0.81  ?CALCIUM 9.1 8.8*  ?MG 2.4 2.1  ? ? ?Potassium  ?Date/Time Value Ref Range Status  ?02/19/2022 02:48 AM 4.0 3.5 - 5.1 mmol/L Final  ? ?Magnesium  ?Date/Time Value Ref Range Status  ?02/19/2022 02:48 AM 2.1 1.7 - 2.4 mg/dL Final  ?  Comment:  ?  Performed at [facility] [address], [place], [place] [address]  ? ? ?Telemetry  ?  ?NSR 60-70s (personally reviewed) ? ?Radiology  ?  ?No results found. ? ? ?Patient Profile  ?   ?Holly Benson is a 88 y.o. female with a past medical history significant for persistent atrial fibrillation.  They were admitted for tikosyn load.  ? ?Assessment & Plan  ?  ?Persistent atrial fibrillation ?Pt  remains in NSR on Tikosyn 250 mcg BID  ?Continue Eliquis ?K 4.0, Mg 2.1 ?CHA2DS2VASC is at least 5. ? ?Patient  Holly Benson not require cardioversion. Plan for home Friday if QTc remains stable.  ? ?For questions or updates, please contact Holly Benson ?Please consult [email or web address] for contact info under Cardiology/STEMI. ? ?Signed, ?Holly Friar, PA-C  ?02/19/2022, 7:09 AM  ? ?I have seen and examined this patient with Holly Benson.  Agree with above, note added to reflect my findings.  Patient currently feeling well.  Remains in sinus rhythm.  No complaints at this time. ? ?GEN: Well nourished, well developed, in no acute distress  ?HEENT: normal  ?Neck: no JVD, carotid bruits, or masses ?Cardiac: RRR; no murmurs, rubs, or gallops,no edema  ?Respiratory:  clear to auscultation bilaterally, normal work of breathing ?GI: soft, nontender, nondistended, + BS ?MS: no deformity or atrophy  ?Skin: warm and dry ?Neuro:  Strength and sensation are intact ?Psych: euthymic mood, full affect  ? ?Persistent  atrial fibrillation: Currently on Eliquis for anticoagulation.  We Holly Benson continue dofetilide load at 250 mcg twice daily.  She is remained in sinus rhythm.  No changes. ? ?Holly Benson M. Holly Mena MD ?02/19/2022 ?11:29 AM  ?

## 2022-02-19 NOTE — Progress Notes (Signed)
Pharmacy: Dofetilide (Tikosyn) - Follow Up ?Assessment and Electrolyte Replacement ? ?Pharmacy consulted to assist in monitoring and replacing electrolytes in this 88 y.o. female admitted on 02/18/2022 undergoing dofetilide initiation. First dofetilide dose: 02/18/22 ? ?Labs: ?   ?Component Value Date/Time  ? K 4.0 02/19/2022 0248  ? MG 2.1 02/19/2022 0248  ?  ? ?Plan: ?Potassium: ?K >/= 4: No additional supplementation needed ? ?Magnesium: ?Mg > 2: No additional supplementation needed ? ? ?Thank you for allowing pharmacy to participate in this patient's care  ? ?Arrie Senate, PharmD, BCPS, BCCP ?Clinical Pharmacist ?[phone] ?Please check AMION for all [place] numbers ?02/19/2022 ? ? ?

## 2022-02-19 NOTE — Progress Notes (Signed)
Morning EKG reviewed   ? ?Shows remains in NSR at 57 bpm with stable QTc at ~440-450 ms. ? ?Continue  Tikosyn 250 mcg BID.  ? ?Pt will not require DCCV  ? ?[name], PA-C  ?Pager: [phone]  ?02/19/2022 10:44 AM  ? ?

## 2022-02-20 DIAGNOSIS — I4819 Other persistent atrial fibrillation: Secondary | ICD-10-CM | POA: Diagnosis not present

## 2022-02-20 LAB — BASIC METABOLIC PANEL
Anion gap: 9 (ref 5–15)
BUN: 18 mg/dL (ref 8–23)
CO2: 25 mmol/L (ref 22–32)
Calcium: 8.8 mg/dL — ABNORMAL LOW (ref 8.9–10.3)
Chloride: 103 mmol/L (ref 98–111)
Creatinine, Ser: 0.8 mg/dL (ref 0.44–1.00)
GFR, Estimated: >60 mL/min (ref >60)
Glucose, Bld: 95 mg/dL (ref 70–99)
Potassium: 4.2 mmol/L (ref 3.5–5.1)
Sodium: 137 mmol/L (ref 135–145)

## 2022-02-20 LAB — MAGNESIUM: Magnesium: 2.1 mg/dL (ref 1.7–2.4)

## 2022-02-20 NOTE — Progress Notes (Signed)
Morning EKG reviewed   ? ?Shows remains in NSR at 57 bpm with stable QTc at ~460-470 ms. ? ?Continue  Tikosyn 250 mcg BID.  ? ?Plan for home tomorrow if QTc remains stable.  ? ?[name], PA-C  ?Pager: [phone]  ?02/20/2022 11:12 AM  ? ?

## 2022-02-20 NOTE — Care Management (Signed)
02-20-22 1333 Case Manager spoke with the patient regarding Tikosyn cost and patient is agreeable to cost. Patient will like her initial Rx to be filled via [facility] and Rx refills for 90 day supply to be escribed to [facility]. Mail Order. No further needs from Case Manager at this time.  ?

## 2022-02-20 NOTE — Progress Notes (Signed)
Pharmacy: Dofetilide (Tikosyn) - Follow Up ?Assessment and Electrolyte Replacement ? ?Pharmacy consulted to assist in monitoring and replacing electrolytes in this 88 y.o. female admitted on 02/18/2022 undergoing dofetilide initiation. First dofetilide dose: 02/18/22 ? ?Labs: ?   ?Component Value Date/Time  ? K 4.2 02/20/2022 0322  ? MG 2.1 02/20/2022 0322  ?  ? ?Plan: ?Potassium: ?K >/= 4: No additional supplementation needed ? ?Magnesium: ?Mg > 2: No additional supplementation needed ? ? ?Thank you for allowing pharmacy to participate in this patient's care  ? ?Arrie Senate, PharmD, BCPS, BCCP ?Clinical Pharmacist ?[phone] ?Please check AMION for all [place] numbers ?02/20/2022 ? ? ?

## 2022-02-20 NOTE — Progress Notes (Addendum)
? ?Electrophysiology Rounding Note ? ?Patient Name: Holly Benson ?Date of Encounter: 02/20/2022 ? ?Primary Cardiologist: Pixie Casino, MD  ?Electrophysiologist: Kathie Posa Meredith Leeds, MD  ? ? ?Subjective  ? ?Pt  remains in NSR  on Tikosyn 250 mcg BID  ? ?QTc from EKG last pm shows stable QTc at ~460-470 ? ?The patient is doing well today.  At this time, the patient denies chest pain, shortness of breath, or any new concerns. ? ?Inpatient Medications  ?  ?Scheduled Meds: ? apixaban  5 mg Oral BID  ? arformoterol  15 mcg Nebulization BID  ? And  ? umeclidinium bromide  1 puff Inhalation Daily  ? carvedilol  6.25 mg Oral BID  ? diltiazem  60 mg Oral BID  ? dofetilide  250 mcg Oral BID  ? gabapentin  100 mg Oral q AM  ? gabapentin  200 mg Oral QHS  ? irbesartan  75 mg Oral Daily  ? rOPINIRole  0.25 mg Oral q AM  ? rOPINIRole  0.75 mg Oral QHS  ? sodium chloride flush  3 mL Intravenous Q12H  ? ?Continuous Infusions: ? sodium chloride    ? ?PRN Meds: ?sodium chloride, acetaminophen, clonazePAM, sodium chloride flush  ? ?Vital Signs  ?  ?Vitals:  ? 02/19/22 1522 02/19/22 1923 02/19/22 2056 02/20/22 0609  ?BP: (!) 126/55  (!) 143/64 (!) 137/50  ?Pulse: 60  67 67  ?Resp: '16  16 15  '$ ?Temp: 97.8 ?F (36.6 ?C)  98.7 ?F (37.1 ?C) 98.7 ?F (37.1 ?C)  ?TempSrc: Oral  Oral Oral  ?SpO2: 95% 96% 97% 96%  ?Weight:      ?Height:      ? ?No intake or output data in the 24 hours ending 02/20/22 0732 ?Filed Weights  ? 02/18/22 1450  ?Weight: 61.2 kg  ? ? ?Physical Exam  ?  ?GEN- The patient is well appearing, alert and oriented x 3 today.   ?Head- normocephalic, atraumatic ?Eyes-  Sclera clear, conjunctiva pink ?Ears- hearing intact ?Oropharynx- clear ?Neck- supple ?Lungs- Clear to ausculation bilaterally, normal work of breathing ?Heart- Regular rate and rhythm, no murmurs, rubs or gallops ?GI- soft, NT, ND, + BS ?Extremities- no clubbing, cyanosis, or edema ?Skin- no rash or lesion ?Psych- euthymic mood, full affect ?Neuro- strength  and sensation are intact ? ?Labs  ?  ?CBC ?No results for input(s): WBC, NEUTROABS, HGB, HCT, MCV, PLT in the last 72 hours. ?Basic Metabolic Panel ?Recent Labs  ?  02/19/22 ?0248 02/20/22 ?0322  ?NA 136 137  ?K 4.0 4.2  ?CL 104 103  ?CO2 25 25  ?GLUCOSE 94 95  ?BUN 18 18  ?CREATININE 0.81 0.80  ?CALCIUM 8.8* 8.8*  ?MG 2.1 2.1  ? ? ?Potassium  ?Date/Time Value Ref Range Status  ?02/20/2022 03:22 AM 4.2 3.5 - 5.1 mmol/L Final  ?  Comment:  ?  SLIGHT HEMOLYSIS  ? ?Magnesium  ?Date/Time Value Ref Range Status  ?02/20/2022 03:22 AM 2.1 1.7 - 2.4 mg/dL Final  ?  Comment:  ?  Performed at [facility], [place] [address], [place], [place] [address]  ? ? ?Telemetry  ?  ?NSR 50-60s (personally reviewed) ? ?Radiology  ?  ?No results found. ? ? ?Patient Profile  ?   ?Holly Benson is a 88 y.o. female with a past medical history significant for persistent atrial fibrillation.  They were admitted for tikosyn load.  ? ?Assessment & Plan  ?  ?Persistent atrial fibrillation ?Pt  remains in  NSR  on Tikosyn 250 mcg BID  ?Continue Eliquis ?Electrolytes stable.  ?CHA2DS2VASC is at least 5. ? ? ?Plan for home tomorrow if QTc remains stable. ? ? ? ? ?For questions or updates, please contact [place] ?Please consult [email or web address] for contact info under Cardiology/STEMI. ? ?Signed, ?[name], PA-C  ?02/20/2022, 7:32 AM  ? ?I have seen and examined this patient with [name].  Agree with above, note added to reflect my findings.  Feeling well. Remains in sinus rhythm ? ?GEN: Well nourished, well developed, in no acute distress  ?HEENT: normal  ?Neck: no JVD, carotid bruits, or masses ?Cardiac: RRR; no murmurs, rubs, or gallops,no edema  ?Respiratory:  clear to auscultation bilaterally, normal work of breathing ?GI: soft, nontender, nondistended, + BS ?MS: no deformity or atrophy  ?Skin: warm and dry ?Neuro:  Strength and sensation are intact ?Psych: euthymic mood, full affect  ? ?Persistent atrial fibrillation:  currently loading on tikosyn. QTc stable. Continue with current dose.  ? ?Charlissa Petros M. Megahn Killings MD ?02/20/2022 ?4:26 PM  ?

## 2022-02-21 ENCOUNTER — Other Ambulatory Visit (HOSPITAL_COMMUNITY): Payer: Self-pay

## 2022-02-21 ENCOUNTER — Encounter (HOSPITAL_COMMUNITY): Payer: Self-pay | Admitting: Internal Medicine

## 2022-02-21 DIAGNOSIS — I4819 Other persistent atrial fibrillation: Secondary | ICD-10-CM | POA: Diagnosis not present

## 2022-02-21 LAB — BASIC METABOLIC PANEL
Anion gap: 6 (ref 5–15)
BUN: 18 mg/dL (ref 8–23)
CO2: 26 mmol/L (ref 22–32)
Calcium: 9 mg/dL (ref 8.9–10.3)
Chloride: 105 mmol/L (ref 98–111)
Creatinine, Ser: 0.95 mg/dL (ref 0.44–1.00)
GFR, Estimated: 58 mL/min — ABNORMAL LOW (ref >60)
Glucose, Bld: 93 mg/dL (ref 70–99)
Potassium: 3.8 mmol/L (ref 3.5–5.1)
Sodium: 137 mmol/L (ref 135–145)

## 2022-02-21 LAB — MAGNESIUM: Magnesium: 2 mg/dL (ref 1.7–2.4)

## 2022-02-21 MED ORDER — DOFETILIDE 250 MCG PO CAPS
250.0000 ug | ORAL_CAPSULE | Freq: Two times a day (BID) | ORAL | 6 refills | Status: DC
  Filled 2022-02-21: qty 60, 30d supply, fill #0

## 2022-02-21 MED ORDER — POTASSIUM CHLORIDE CRYS ER 20 MEQ PO TBCR
40.0000 meq | EXTENDED_RELEASE_TABLET | Freq: Once | ORAL | Status: AC
  Administered 2022-02-21: 40 meq via ORAL
  Filled 2022-02-21: qty 2

## 2022-02-21 MED ORDER — MAGNESIUM SULFATE 2 GM/50ML IV SOLN
2.0000 g | Freq: Once | INTRAVENOUS | Status: AC
  Administered 2022-02-21: 2 g via INTRAVENOUS
  Filled 2022-02-21: qty 50

## 2022-02-21 NOTE — Plan of Care (Signed)

## 2022-02-21 NOTE — Discharge Summary (Addendum)
? ? ? ?ELECTROPHYSIOLOGY PROCEDURE DISCHARGE SUMMARY  ? ? ?Patient ID: Holly Benson,  ?MRN: [id number], DOB/AGE: 1933-04-22 88 y.o. ? ?Admit date: 02/18/2022 ?Discharge date: 02/21/2022 ? ?Primary Care Physician: Holly Boston, MD  ?Primary Cardiologist: Holly Casino, MD  ?Electrophysiologist: Holly Masterson Meredith Leeds, MD  ? ?Primary Discharge Diagnosis:  ?1.  Paroxysmal atrial fibrillation status post Tikosyn loading this admission ? ?Allergies  ?Allergen Reactions  ? Lovenox [Enoxaparin Sodium] Hives and Rash  ?  PT STATES SHE BROKE OUT IN A RASH HEAD TO TOE AND LASTED ABOUT 3 WEEKS   ? Metoprolol Succinate [Metoprolol] Other (See Comments)  ?  Muscle aches, hand pain, and tingling  ? ? ? ?Procedures This Admission:  ?1.  Tikosyn loading ? ?Brief HPI: ?Holly Benson is a 88 y.o. female with a past medical history as noted above.  They were referred to EP in the outpatient setting for treatment options of atrial fibrillation.  Risks, benefits, and alternatives to Tikosyn were reviewed with the patient who wished to proceed.   ? ?Hospital Course:  ?The patient was admitted and Tikosyn was initiated.  Renal function and electrolytes were followed during the hospitalization.  Their QTc remained stable.  She presented in NSR and did not require [place].  They were monitored until discharge on telemetry which demonstrated sinus brady/NSR and stable QT.  On the day of discharge, they were examined by Dr. Curt Benson  who considered them stable for discharge to home.  Follow-up has been arranged with the Atrial Fibrillation clinic in approximately 1 week and with  EP APP   in 4 weeks.  ? ?Physical Exam: ?Vitals:  ? 02/20/22 2235 02/21/22 6073 02/21/22 0941 02/21/22 0942  ?BP: (!) 159/73 126/60 (!) 131/54   ?Pulse: 70 66    ?Resp: 16 16    ?Temp: 98.1 ?F (36.7 ?C) 98.6 ?F (37 ?C)    ?TempSrc: Oral Oral    ?SpO2: 93% 94%  95%  ?Weight:      ?Height:      ? ? ?GEN- The patient is well appearing, alert and oriented x 3 today.   ?HEENT:  normocephalic, atraumatic; sclera clear, conjunctiva pink; hearing intact; oropharynx clear; neck supple, no JVP ?Lymph- no cervical lymphadenopathy ?Lungs- Clear to ausculation bilaterally, normal work of breathing.  No wheezes, rales, rhonchi ?Heart- Regular rate and rhythm, no murmurs, rubs or gallops, PMI not laterally displaced ?GI- soft, non-tender, non-distended, bowel sounds present, no hepatosplenomegaly ?Extremities- no clubbing, cyanosis, or edema; DP/PT/radial pulses 2+ bilaterally ?MS- no significant deformity or atrophy ?Skin- warm and dry, no rash or lesion ?Psych- euthymic mood, full affect ?Neuro- strength and sensation are intact ? ? ?Labs: ?  ?Lab Results  ?Component Value Date  ? WBC 6.7 10/08/2021  ? HGB 13.2 10/08/2021  ? HCT 39.1 10/08/2021  ? MCV 98 (H) 10/08/2021  ? PLT 288 10/08/2021  ?  ?Recent Labs  ?Lab 02/21/22 ?0248  ?NA 137  ?K 3.8  ?CL 105  ?CO2 26  ?BUN 18  ?CREATININE 0.95  ?CALCIUM 9.0  ?GLUCOSE 93  ? ? ? ?Discharge Medications:  ?Allergies as of 02/21/2022   ? ?   Reactions  ? Lovenox [enoxaparin Sodium] Hives, Rash  ? PT STATES SHE BROKE OUT IN A RASH HEAD TO TOE AND LASTED ABOUT 3 WEEKS   ? Metoprolol Succinate [metoprolol] Other (See Comments)  ? Muscle aches, hand pain, and tingling  ? ?  ? ?  ?Medication List  ?  ? ?  TAKE these medications   ? ?acetaminophen 325 MG tablet ?Commonly known as: TYLENOL ?Take 650 mg by mouth as needed for moderate pain or headache. ?  ?albuterol 108 (90 Base) MCG/ACT inhaler ?Commonly known as: VENTOLIN HFA ?Inhale 2 puffs into the lungs every 6 (six) hours as needed for wheezing or shortness of breath. ?  ?apixaban 5 MG Tabs tablet ?Commonly known as: Eliquis ?Take 1 tablet (5 mg total) by mouth 2 (two) times daily. ?  ?carvedilol 6.25 MG tablet ?Commonly known as: COREG ?Take 1 tablet (6.25 mg total) by mouth 2 (two) times daily. ?  ?cetirizine 10 MG tablet ?Commonly known as: ZYRTEC ?Take 10 mg by mouth daily as needed for allergies. ?   ?clonazePAM 0.5 MG tablet ?Commonly known as: KLONOPIN ?TAKE 1 TABLET AT BEDTIME ASNEEDED FOR RLS ?What changed:  ?how much to take ?how to take this ?when to take this ?reasons to take this ?additional instructions ?  ?Colon Cleanse Caps ?Take 4 capsules by mouth as needed (constipation). ?  ?diltiazem 60 MG 12 hr capsule ?Commonly known as: CARDIZEM SR ?Take 1 capsule (60 mg total) by mouth 2 (two) times daily. ?  ?dofetilide 250 MCG capsule ?Commonly known as: TIKOSYN ?Take 1 capsule (250 mcg total) by mouth 2 (two) times daily. ?  ?furosemide 20 MG tablet ?Commonly known as: LASIX ?Take one tablet by mouth on Monday and Friday ?  ?gabapentin 100 MG capsule ?Commonly known as: NEURONTIN ?Take 1 capsule by mouth in the morning and 2 capsules in the evening. ?What changed:  ?how much to take ?when to take this ?  ?irbesartan 75 MG tablet ?Commonly known as: AVAPRO ?Take 1 tablet (75 mg total) by mouth daily. ?What changed: when to take this ?  ?polyethylene glycol 17 g packet ?Commonly known as: MIRALAX / GLYCOLAX ?Take 17 g by mouth daily as needed for moderate constipation. ?  ?rOPINIRole 0.25 MG tablet ?Commonly known as: Requip ?Take 1 tablet in the morning and three tablets after dinner po ?What changed:  ?how much to take ?how to take this ?when to take this ?  ?Stiolto Respimat 2.5-2.5 MCG/ACT Aers ?Generic drug: Tiotropium Bromide-Olodaterol ?USE 2 INHALATIONS ORALLY   EVERY MORNING ?  ?THERATEARS OP ?Place 1 drop into both eyes daily as needed (dry eyes). ?  ? ?  ? ? ?Disposition:  ? ? Follow-up Information   ? ? [facility] Follow up.   ?Specialty: Cardiology ?Why: on 4/28 at 1000 am for post hospital follow up. ?Contact information: ?[address] ?[phone] mc ?[place] ?[phone] ? ?  ?  ? ?  ?  ? ?  ? ? ?Duration of Discharge Encounter: Greater than 30 minutes including physician time. ? ?Signed, ?Holly Friar, PA-C   ?02/21/2022 ?10:48 AM ? ? ? ?I have seen and examined this patient with Holly Benson.  Agree with above, note added to reflect my findings.  Feeling well without complaint ? ?GEN: Well nourished, well developed, in no acute distress  ?HEENT: normal  ?Neck: no JVD, carotid bruits, or masses ?Cardiac: RRR; no murmurs, rubs, or gallops,no edema  ?Respiratory:  clear to auscultation bilaterally, normal work of breathing ?GI: soft, nontender, nondistended, + BS ?MS: no deformity or atrophy  ?Skin: warm and dry ?Neuro:  Strength and sensation are intact ?Psych: euthymic mood, full affect  ? ?Persistent AF: remains in sinus rhythm. QTc stable. [place] for discharge today with folloow up in clinic.  ? ?  Jennell Janosik M. Zalaya Astarita MD ?02/21/2022 ?1:40 PM  ?

## 2022-02-21 NOTE — Progress Notes (Signed)
EKG from yesterday evening 02/20/2022 reviewed   ? ?Shows remains in NSR at 67 bpm with stable QTc at ~460-470 ms. ? ?K 3.8, Mg 2.0. Supp ordered for both.  ? ?Continue  Tikosyn 250 mcg BID.  ? ?Plan for home this afternoon if QT remains stable.    ? ?[name], PA-C  ?Pager: [phone]  ?02/21/2022 7:53 AM  ? ?

## 2022-02-21 NOTE — Progress Notes (Signed)
Pharmacy: Dofetilide (Tikosyn) - Follow Up ?Assessment and Electrolyte Replacement ? ?Pharmacy consulted to assist in monitoring and replacing electrolytes in this 88 y.o. female admitted on 02/18/2022 undergoing dofetilide initiation. First dofetilide dose: 02/18/22 ? ?Labs: ?   ?Component Value Date/Time  ? K 3.8 02/21/2022 0248  ? MG 2.0 02/21/2022 0248  ?  ? ?Plan: ?Potassium: ?K 3.8-3.9:  Give KCl 40 mEq po x1  ? ?Magnesium: ?Mg 1.8-2: Give Mg 2 gm IV x1  ? ? ?Thank you for allowing pharmacy to participate in this patient's care  ? ?Arrie Senate, PharmD, BCPS, BCCP ?Clinical Pharmacist ?[phone] ?Please check AMION for all [place] numbers ?02/21/2022 ? ? ?

## 2022-02-21 NOTE — Care Management Important Message (Signed)
Important Message ? ?Patient Details  ?Name: Holly Benson ?MRN: [id number] ?Date of Birth: 03/21/1933 ? ? ?Medicare Important Message Given:  Yes ? ? ? ? ?[name] ?02/21/2022, 10:04 AM ?

## 2022-02-21 NOTE — Progress Notes (Signed)
Patient given discharge instructions and stated understanding. 

## 2022-02-25 ENCOUNTER — Ambulatory Visit (HOSPITAL_BASED_OUTPATIENT_CLINIC_OR_DEPARTMENT_OTHER)
Admission: RE | Admit: 2022-02-25 | Discharge: 2022-02-25 | Disposition: A | Discharge status: Home / Self Care | Payer: Medicare Other | Source: Ambulatory Visit | Attending: Emergency Medicine | Admitting: Emergency Medicine

## 2022-02-25 ENCOUNTER — Encounter (HOSPITAL_BASED_OUTPATIENT_CLINIC_OR_DEPARTMENT_OTHER): Payer: Self-pay

## 2022-02-25 DIAGNOSIS — R911 Solitary pulmonary nodule: Secondary | ICD-10-CM | POA: Diagnosis not present

## 2022-02-25 DIAGNOSIS — R9389 Abnormal findings on diagnostic imaging of other specified body structures: Secondary | ICD-10-CM | POA: Diagnosis not present

## 2022-02-25 DIAGNOSIS — J432 Centrilobular emphysema: Secondary | ICD-10-CM | POA: Diagnosis not present

## 2022-02-28 ENCOUNTER — Ambulatory Visit (HOSPITAL_COMMUNITY)
Admit: 2022-02-28 | Discharge: 2022-02-28 | Disposition: A | Discharge status: Home / Self Care | Payer: Medicare Other | Attending: Physician Assistant | Admitting: Physician Assistant

## 2022-02-28 VITALS — BP 132/64 | HR 53 | Ht 62.0 in | Wt 138.4 lb

## 2022-02-28 DIAGNOSIS — I10 Essential (primary) hypertension: Secondary | ICD-10-CM | POA: Diagnosis not present

## 2022-02-28 DIAGNOSIS — D6869 Other thrombophilia: Secondary | ICD-10-CM | POA: Insufficient documentation

## 2022-02-28 DIAGNOSIS — I739 Peripheral vascular disease, unspecified: Secondary | ICD-10-CM | POA: Insufficient documentation

## 2022-02-28 DIAGNOSIS — R001 Bradycardia, unspecified: Secondary | ICD-10-CM | POA: Diagnosis not present

## 2022-02-28 DIAGNOSIS — E785 Hyperlipidemia, unspecified: Secondary | ICD-10-CM | POA: Diagnosis not present

## 2022-02-28 DIAGNOSIS — J449 Chronic obstructive pulmonary disease, unspecified: Secondary | ICD-10-CM | POA: Insufficient documentation

## 2022-02-28 DIAGNOSIS — I4892 Unspecified atrial flutter: Secondary | ICD-10-CM | POA: Diagnosis not present

## 2022-02-28 DIAGNOSIS — G4733 Obstructive sleep apnea (adult) (pediatric): Secondary | ICD-10-CM | POA: Insufficient documentation

## 2022-02-28 DIAGNOSIS — I4819 Other persistent atrial fibrillation: Secondary | ICD-10-CM | POA: Diagnosis not present

## 2022-02-28 DIAGNOSIS — Z7901 Long term (current) use of anticoagulants: Secondary | ICD-10-CM | POA: Diagnosis not present

## 2022-02-28 LAB — MAGNESIUM: Magnesium: 2.3 mg/dL (ref 1.7–2.4)

## 2022-02-28 LAB — BASIC METABOLIC PANEL
Anion gap: 7 (ref 5–15)
BUN: 20 mg/dL (ref 8–23)
CO2: 29 mmol/L (ref 22–32)
Calcium: 9.5 mg/dL (ref 8.9–10.3)
Chloride: 101 mmol/L (ref 98–111)
Creatinine, Ser: 0.73 mg/dL (ref 0.44–1.00)
GFR, Estimated: >60 mL/min (ref >60)
Glucose, Bld: 90 mg/dL (ref 70–99)
Potassium: 4.6 mmol/L (ref 3.5–5.1)
Sodium: 137 mmol/L (ref 135–145)

## 2022-02-28 MED ORDER — FUROSEMIDE 20 MG PO TABS: ORAL_TABLET | ORAL | Status: DC

## 2022-02-28 MED ORDER — DOFETILIDE 250 MCG PO CAPS: 250.0000 ug | ORAL_CAPSULE | Freq: Two times a day (BID) | ORAL | 1 refills | Status: DC

## 2022-02-28 NOTE — Progress Notes (Signed)
? ? ?Primary Care Physician: Holly Boston, MD ?Primary Cardiologist: Dr Holly Benson ?Primary Electrophysiologist: Dr Holly Benson ?Referring Physician: Dr Holly Benson ? ? ?Holly Benson is a 88 y.o. female with a history of HTN, atrial flutter, HLD, OSA, COPD, PAD, and atrial fibrillation who presents for follow up in the [facility]. She was previously on amiodarone but this was discontinued due to concern about possible lung toxicity. She states that over the last few weeks she has had frequent "fluttering" sensations in her chest which last 10-15 minutes but can happen multiple times per day. She states that this feels "totally different" from her afib in the past. She denies any other symptoms. Patient is on Eliquis for a CHADS2VASC score of 5. ? ?Pt is being seen  on f/u per Holly Benson for afib. Pt has been seen by  Holly Benson, Holly Benson , 12/8  and Holly Benson, 12/6. When she was seen last by Holly Benson, tikosyn was discussed but pt was not ready to commit. She was started on more rate control on last visit with Holly Benson, and eliquis was put back at '5mg'$  bid as she had gained weight. She is planning to go to [facility] for the month of January. She in interested in [place] when she returns in February. She is rate controlled. Weight is stable.   ? ?Today, she denies symptoms of chest pain, shortness of breath, orthopnea, PND, lower extremity edema, dizziness, presyncope, syncope, snoring, daytime somnolence, bleeding, or neurologic sequela. The patient is tolerating medications without difficulties and is otherwise without complaint today.  ? ?F/u in afib clinic 02/18/22 for Tikosyn admit. She is in SR today. She states that she is going in and out since stopping amiodarone last spring. No benadryl use, no missed anticoagulation. Qtc is stable at 407 ms.  ? ?Follow up in the AF clinic 02/28/22. Patient is s/p dofetilide loading 4/18-4/21/23. She did have one episode of irregular heart beat  but this was brief. She is in SR today. She states she does not feel much different since starting the medication. She does have some mild ankle swelling and fatigue.  ? ? ?Atrial Fibrillation Risk Factors: ? ?she does have symptoms or diagnosis of sleep apnea. ?she is compliant with CPAP therapy. ?she does not have a history of rheumatic fever. ? ? ?she has a BMI of Body mass index is 25.31 kg/m?Marland KitchenMarland Kitchen ?Filed Weights  ? 02/28/22 0940  ?Weight: 62.8 kg  ? ? ? ?Family History  ?Problem Relation Age of Onset  ? Suicidality Father   ? Stroke Maternal Grandfather   ? Hypertension Sister   ? Colon cancer Neg Hx   ? Esophageal cancer Neg Hx   ? Pancreatic cancer Neg Hx   ? Stomach cancer Neg Hx   ? Liver disease Neg Hx   ? ? ? ?Atrial Fibrillation Management history: ? ?Previous antiarrhythmic drugs: flecainide, amiodarone, dofetilide   ?Previous cardioversions: none ?Previous ablations: none ?CHADS2VASC score: 5 ?Anticoagulation history: Eliquis ? ? ?Past Medical History:  ?Diagnosis Date  ? Arrhythmia   ? Arthritis   ? Atrial fibrillation ([place])   ? Constipation   ? chronic  ? COPD (chronic obstructive pulmonary disease) ([place])   ? Hyperlipidemia   ? Hypertension   ? pulmonary  ? OSA on CPAP   ? Osteoporosis   ? RLS (restless legs syndrome)   ? Sleep apnea   ? ?Past Surgical History:  ?Procedure Laterality Date  ? ABDOMINAL AORTOGRAM W/LOWER  EXTREMITY N/A 03/21/2020  ? Procedure: ABDOMINAL AORTOGRAM W/ Bilateral LOWER EXTREMITY [place];  Surgeon: Wellington Hampshire, MD;  Location: [facility];  Service: Cardiovascular;  Laterality: N/A;  ? ABDOMINAL HYSTERECTOMY    ? arm surgery Right   ? Broken arm and has a plate in it  ? CATARACT EXTRACTION Bilateral   ? COLONOSCOPY    ? More than 10 years ago In [facility]  ? KNEE ARTHROSCOPY Right   ? PERIPHERAL VASCULAR INTERVENTION Bilateral 03/21/2020  ? Procedure: PERIPHERAL VASCULAR INTERVENTION;  Surgeon: Wellington Hampshire, MD;  Location: [facility];  Service:  Cardiovascular;  Laterality: Bilateral;  external iliac  ? ? ?Current Outpatient Medications  ?Medication Sig Dispense Refill  ? acetaminophen (TYLENOL) 325 MG tablet Take 650 mg by mouth as needed for moderate pain or headache.    ? albuterol (VENTOLIN HFA) 108 (90 Base) MCG/ACT inhaler Inhale 2 puffs into the lungs every 6 (six) hours as needed for wheezing or shortness of breath. 18 g 6  ? apixaban (ELIQUIS) 5 MG TABS tablet Take 1 tablet (5 mg total) by mouth 2 (two) times daily. 180 tablet 1  ? Carboxymethylcellulose Sodium (THERATEARS OP) Place 1 drop into both eyes daily as needed (dry eyes).    ? carvedilol (COREG) 6.25 MG tablet Take 1 tablet (6.25 mg total) by mouth 2 (two) times daily. 180 tablet 3  ? cetirizine (ZYRTEC) 10 MG tablet Take 10 mg by mouth daily as needed for allergies.     ? clonazePAM (KLONOPIN) 0.5 MG tablet TAKE 1 TABLET AT BEDTIME ASNEEDED FOR RLS (Patient taking differently: Take 0.25 mg by mouth at bedtime as needed (restless legs).) 30 tablet 1  ? gabapentin (NEURONTIN) 100 MG capsule Take 1 capsule by mouth in the morning and 2 capsules in the evening. (Patient taking differently: 100-200 mg 2 (two) times daily. Take 1 capsule by mouth in the morning and 2 capsules in the evening.) 90 capsule 5  ? irbesartan (AVAPRO) 75 MG tablet Take 1 tablet (75 mg total) by mouth daily. (Patient taking differently: Take 75 mg by mouth every evening.) 90 tablet 3  ? Misc Natural Products (COLON CLEANSE) CAPS Take 4 capsules by mouth as needed (constipation).    ? polyethylene glycol (MIRALAX / GLYCOLAX) 17 g packet Take 17 g by mouth daily as needed for moderate constipation.    ? rOPINIRole (REQUIP) 0.25 MG tablet Take 1 tablet in the morning and three tablets after dinner po (Patient taking differently: Take 0.25-0.75 mg by mouth in the morning and at bedtime. Take 1 tablet in the morning and three tablets after dinner po) 360 tablet 3  ? STIOLTO RESPIMAT 2.5-2.5 MCG/ACT AERS USE 2 INHALATIONS  ORALLY   EVERY MORNING 12 g 1  ? dofetilide (TIKOSYN) 250 MCG capsule Take 1 capsule (250 mcg total) by mouth 2 (two) times daily. 180 capsule 1  ? furosemide (LASIX) 20 MG tablet Take one tablet by mouth on Monday, Wednesday and Friday    ? ?No current facility-administered medications for this encounter.  ? ? ?Allergies  ?Allergen Reactions  ? Lovenox [Enoxaparin Sodium] Hives and Rash  ?  PT STATES SHE BROKE OUT IN A RASH HEAD TO TOE AND LASTED ABOUT 3 WEEKS   ? Metoprolol Succinate [Metoprolol] Other (See Comments)  ?  Muscle aches, hand pain, and tingling  ? ? ?Social History  ? ?Socioeconomic History  ? Marital status: Widowed  ?  Spouse name: Not  on file  ? Number of children: 3  ? Years of education: Not on file  ? Highest education level: Not on file  ?Occupational History  ? Occupation: retired  ?  Comment: retired Licensed conveyancer  ?Tobacco Use  ? Smoking status: Former  ?  Packs/day: 1.50  ?  Years: 30.00  ?  Pack years: 45.00  ?  Types: Cigarettes  ?  Start date: 84  ?  Quit date: 11/04/1991  ?  Years since quitting: 30.3  ? Smokeless tobacco: Never  ?Vaping Use  ? Vaping Use: Never used  ?Substance and Sexual Activity  ? Alcohol use: Yes  ?  Alcohol/week: 1.0 standard drink  ?  Types: 1 Glasses of wine per week  ?  Comment: nightly  ? Drug use: No  ? Sexual activity: Not Currently  ?Other Topics Concern  ? Not on file  ?Social History Narrative  ? Lives with daughter  ? ?Social Determinants of Health  ? ?Financial Resource Strain: Not on file  ?Food Insecurity: Not on file  ?Transportation Needs: Not on file  ?Physical Activity: Not on file  ?Stress: Not on file  ?Social Connections: Not on file  ?Intimate Partner Violence: Not on file  ? ? ? ?ROS- All systems are reviewed and negative except as per the HPI above. ? ?Physical Exam: ?Vitals:  ? 02/28/22 0940  ?BP: 132/64  ?Pulse: (!) 53  ?Weight: 62.8 kg  ?Height: '5\' 2"'$  (1.575 m)  ? ? ? ?GEN- The patient is a well appearing elderly female, alert and oriented x  3 today.   ?HEENT-head normocephalic, atraumatic, sclera clear, conjunctiva pink, hearing intact, trachea midline. ?Lungs- Clear to ausculation bilaterally, normal work of breathing ?Heart- Regular rate and r

## 2022-03-03 ENCOUNTER — Encounter: Payer: Self-pay | Admitting: Cardiology

## 2022-03-03 ENCOUNTER — Telehealth: Payer: Self-pay | Admitting: Adult Health

## 2022-03-03 NOTE — Telephone Encounter (Signed)
Unclear what her shortness of breath is coming from she has not been seen in office for greater than 1 year.  I definitely can try to work her in sooner to be seen and evaluated so we can discuss this more if she cannot wait then I would recommend urgent care or emergency room. ? ?Her pulmonary nodule in the right upper lobe has increased just slightly.  We can also discuss this at her office visit and decide if any additional testing is indicated. ? ? ?Please contact office for sooner follow up if symptoms do not improve or worsen or seek emergency care  ? ? ?

## 2022-03-03 NOTE — Telephone Encounter (Signed)
Called and spoke with pt who states she has had some increased SOB for the past 2 weeks. Said that she is using her albuterol inhaler twice a day. Stated to pt that she can use it every 6 hours and she verbalized understanding. ? ? ?While speaking with pt, she wanted to know if someone could go ahead and let her know the results of CT prior to her appt with TP 5/8. Since pt has the appt with Tammy, routing this to her for her to review. ? ?Please advise on results of pt's recent CT. ?

## 2022-03-04 ENCOUNTER — Other Ambulatory Visit: Payer: Self-pay | Admitting: General Practice

## 2022-03-04 NOTE — Telephone Encounter (Signed)
Left message for patient to call back  

## 2022-03-10 ENCOUNTER — Ambulatory Visit (INDEPENDENT_AMBULATORY_CARE_PROVIDER_SITE_OTHER): Payer: Medicare Other | Admitting: Adult Health

## 2022-03-10 ENCOUNTER — Telehealth: Payer: Self-pay | Admitting: *Deleted

## 2022-03-10 ENCOUNTER — Encounter: Payer: Self-pay | Admitting: Adult Health

## 2022-03-10 ENCOUNTER — Other Ambulatory Visit (INDEPENDENT_AMBULATORY_CARE_PROVIDER_SITE_OTHER): Payer: Medicare Other

## 2022-03-10 VITALS — BP 120/60 | HR 94 | Temp 97.8°F | Ht 63.0 in | Wt 134.4 lb

## 2022-03-10 DIAGNOSIS — J441 Chronic obstructive pulmonary disease with (acute) exacerbation: Secondary | ICD-10-CM

## 2022-03-10 DIAGNOSIS — G4731 Primary central sleep apnea: Secondary | ICD-10-CM | POA: Diagnosis not present

## 2022-03-10 DIAGNOSIS — R0602 Shortness of breath: Secondary | ICD-10-CM

## 2022-03-10 DIAGNOSIS — R9389 Abnormal findings on diagnostic imaging of other specified body structures: Secondary | ICD-10-CM | POA: Diagnosis not present

## 2022-03-10 DIAGNOSIS — R911 Solitary pulmonary nodule: Secondary | ICD-10-CM

## 2022-03-10 LAB — CBC WITH DIFFERENTIAL/PLATELET
Basophils Absolute: 0.1 10*3/uL (ref 0.0–0.1)
Basophils Relative: 0.8 % (ref 0.0–3.0)
Eosinophils Absolute: 0.2 10*3/uL (ref 0.0–0.7)
Eosinophils Relative: 2.4 % (ref 0.0–5.0)
HCT: 41.7 % (ref 36.0–46.0)
Hemoglobin: 14 g/dL (ref 12.0–15.0)
Lymphocytes Relative: 24.8 % (ref 12.0–46.0)
Lymphs Abs: 1.6 10*3/uL (ref 0.7–4.0)
MCHC: 33.5 g/dL (ref 30.0–36.0)
MCV: 99.2 fl (ref 78.0–100.0)
Monocytes Absolute: 0.9 10*3/uL (ref 0.1–1.0)
Monocytes Relative: 14.2 % — ABNORMAL HIGH (ref 3.0–12.0)
Neutro Abs: 3.8 10*3/uL (ref 1.4–7.7)
Neutrophils Relative %: 57.8 % (ref 43.0–77.0)
Platelets: 318 10*3/uL (ref 150.0–400.0)
RBC: 4.2 Mil/uL (ref 3.87–5.11)
RDW: 15.2 % (ref 11.5–15.5)
WBC: 6.6 10*3/uL (ref 4.0–10.5)

## 2022-03-10 MED ORDER — ALBUTEROL SULFATE HFA 108 (90 BASE) MCG/ACT IN AERS: 2.0000 | INHALATION_SPRAY | Freq: Four times a day (QID) | RESPIRATORY_TRACT | 6 refills | Status: DC | PRN

## 2022-03-10 NOTE — Assessment & Plan Note (Addendum)
COPD with emphysema.  Overall patient appears to be compensated.  She does have some increased shortness of breath.  Recent CT chest shows stable emphysema.  And resolution of previous opacities.  There is a subpleural pulmonary nodule in the right upper lobe that has increased slightly in size. -We will set up PET scan to further evaluate and decide if tissue sampling is indicated ? ?Plan  ?Patient Instructions  ?Set up for PET scan.  ?Continue on Stiolto 2 puffs daily.  ?Albuterol inhaler As needed   ?Labs today  ?Aspiration swallow precautions/GERD precautions  ?Activity as tolerated.  ?Follow up with Dr. Lamonte Sakai  In 4-6 weeks and As needed   ?Please contact office for sooner follow up if symptoms do not improve or worsen or seek emergency care  ? ? ?  ? ?

## 2022-03-10 NOTE — Telephone Encounter (Signed)
ATC Brad with [employer].  LVM that we need access to her download.  She was set up 09/04/2021 and we do not have acces to her account.  Asked that we be associated with her account so we can pull her data. ?

## 2022-03-10 NOTE — Progress Notes (Signed)
? ?'@Patient'$  ID: Holly Benson, female    DOB: December 11, 1932, 88 y.o.   MRN: [id number] ? ?Chief Complaint  ?Patient presents with  ? Follow-up  ? ? ?Referring provider: ?Michael Boston, MD ? ?HPI: ?88 year old female former smoker followed for COPD with emphysema , chronic cough complicated by GERD and allergic rhinitis ?Medical history significant for atrial fibrillation previously on amiodarone-on Eliquis.  Hypertension, OSA on nocturnal CPAP managed by Dr. Claiborne Billings ? ? ?TEST/EVENTS :  ?2D echo September 26, 2020 EF 6065%, moderately elevated pulmonary artery pressure, RVSP 48.7 mmHg.,  Right atrium severely dilated, left atrium severely dilated.  Moderate tricuspid valve regurg, mild to moderate aortic valve sclerosis ? ?03/10/2022 Follow up ; COPD and lung nodule  ?Patient returns for a 1 year follow-up.  Patient has underlying COPD.  She remains on Stiolto daily.  Says overall breathing has not been doing as well.  She feels that she is more short of breath with increased activities , decreased activity tolerate. No increased cough .  ?Patient was treated for a COPD exacerbation/pneumonia March 2022.  CT chest January 16, 2021 showed multiple irregular nodular foci consolidation and moderate bronchiectasis.  Follow-up CT chest Mar 07, 2021 shows substantial improvement in her nodular infiltrates.  She was recommended for follow-up CT chest that was completed February 25, 2022.  This showed a subpleural pulmonary nodule in the right upper lobe measuring 1.3 x 0.8 which was slightly increased from 1.2 x 0.6 cm.  Previous lung opacities have resolved. ?Gets winded easily . No increased cough or congestion.  ?Patient denies any weight loss or hemoptysis.Marland Kitchen ? ?Recently admitted started on Tikosyn .  Does not notice any increased symptoms. ? ?Active, exercises 2 days with yoga and gym. Limited by back and hips.  ?Lives with daughter. Drives .  ?Eats well.  ? ? ?Allergies  ?Allergen Reactions  ? Lovenox [Enoxaparin Sodium] Hives and  Rash  ?  PT STATES SHE BROKE OUT IN A RASH HEAD TO TOE AND LASTED ABOUT 3 WEEKS   ? Metoprolol Succinate [Metoprolol] Other (See Comments)  ?  Muscle aches, hand pain, and tingling  ? ? ?Immunization History  ?Administered Date(s) Administered  ? Influenza, High Dose Seasonal PF 08/03/2017, 07/15/2018, 06/28/2019, 08/03/2020  ? Influenza,inj,Quad PF,6+ Mos 08/02/2018  ? Influenza-Unspecified 08/27/2021  ? PFIZER(Purple Top)SARS-COV-2 Vaccination 12/08/2019, 01/02/2020, 10/02/2020, 03/04/2021  ? Pneumococcal Polysaccharide-23 01/27/2020  ? Zoster Recombinat (Shingrix) 12/25/2017, 03/04/2021  ? ? ?Past Medical History:  ?Diagnosis Date  ? Arrhythmia   ? Arthritis   ? Atrial fibrillation ([place])   ? Constipation   ? chronic  ? COPD (chronic obstructive pulmonary disease) ([place])   ? Hyperlipidemia   ? Hypertension   ? pulmonary  ? OSA on CPAP   ? Osteoporosis   ? RLS (restless legs syndrome)   ? Sleep apnea   ? ? ?Tobacco History: ?Social History  ? ?Tobacco Use  ?Smoking Status Former  ? Packs/day: 1.50  ? Years: 30.00  ? Pack years: 45.00  ? Types: Cigarettes  ? Start date: 78  ? Quit date: 11/04/1991  ? Years since quitting: 30.3  ?Smokeless Tobacco Never  ? ?Counseling given: Not Answered ? ? ?Outpatient Medications Prior to Visit  ?Medication Sig Dispense Refill  ? acetaminophen (TYLENOL) 325 MG tablet Take 650 mg by mouth as needed for moderate pain or headache.    ? apixaban (ELIQUIS) 5 MG TABS tablet Take 1 tablet (5 mg total) by mouth 2 (two) times daily.  180 tablet 1  ? Carboxymethylcellulose Sodium (THERATEARS OP) Place 1 drop into both eyes daily as needed (dry eyes).    ? carvedilol (COREG) 6.25 MG tablet Take 1 tablet (6.25 mg total) by mouth 2 (two) times daily. 180 tablet 3  ? cetirizine (ZYRTEC) 10 MG tablet Take 10 mg by mouth daily as needed for allergies.     ? clonazePAM (KLONOPIN) 0.5 MG tablet TAKE 1 TABLET AT BEDTIME ASNEEDED FOR RLS (Patient taking differently: Take 0.25 mg by mouth at bedtime as  needed (restless legs).) 30 tablet 1  ? diltiazem (CARDIZEM SR) 60 MG 12 hr capsule TAKE ONE CAPSULE BY MOUTH TWICE A DAY 180 capsule 3  ? dofetilide (TIKOSYN) 250 MCG capsule Take 1 capsule (250 mcg total) by mouth 2 (two) times daily. 180 capsule 1  ? furosemide (LASIX) 20 MG tablet Take one tablet by mouth on Monday, Wednesday and Friday    ? gabapentin (NEURONTIN) 100 MG capsule Take 1 capsule by mouth in the morning and 2 capsules in the evening. (Patient taking differently: 100-200 mg 2 (two) times daily. Take 1 capsule by mouth in the morning and 2 capsules in the evening.) 90 capsule 5  ? irbesartan (AVAPRO) 75 MG tablet Take 1 tablet (75 mg total) by mouth daily. (Patient taking differently: Take 75 mg by mouth every evening.) 90 tablet 3  ? Misc Natural Products (COLON CLEANSE) CAPS Take 4 capsules by mouth as needed (constipation).    ? polyethylene glycol (MIRALAX / GLYCOLAX) 17 g packet Take 17 g by mouth daily as needed for moderate constipation.    ? rOPINIRole (REQUIP) 0.25 MG tablet Take 1 tablet in the morning and three tablets after dinner po (Patient taking differently: Take 0.25-0.75 mg by mouth in the morning and at bedtime. Take 1 tablet in the morning and three tablets after dinner po) 360 tablet 3  ? STIOLTO RESPIMAT 2.5-2.5 MCG/ACT AERS USE 2 INHALATIONS ORALLY   EVERY MORNING 12 g 1  ? albuterol (VENTOLIN HFA) 108 (90 Base) MCG/ACT inhaler Inhale 2 puffs into the lungs every 6 (six) hours as needed for wheezing or shortness of breath. 18 g 6  ? ?No facility-administered medications prior to visit.  ? ? ? ?Review of Systems:  ? ?Constitutional:   No  weight loss, night sweats,  Fevers, chills,  ?+fatigue, or  lassitude. ? ?HEENT:   No headaches,  Difficulty swallowing,  Tooth/dental problems, or  Sore throat,  ?              No sneezing, itching, ear ache, nasal congestion, post nasal drip,  ? ?CV:  No chest pain,  Orthopnea, PND, swelling in lower extremities, anasarca, dizziness,  palpitations, syncope.  ? ?GI  No heartburn, indigestion, abdominal pain, nausea, vomiting, diarrhea, change in bowel habits, loss of appetite, bloody stools.  ? ?Resp:   No excess mucus, no productive cough,  No non-productive cough,  No coughing up of blood.  No change in color of mucus.  No wheezing.  No chest wall deformity ? ?Skin: no rash or lesions. ? ?GU: no dysuria, change in color of urine, no urgency or frequency.  No flank pain, no hematuria  ? ?MS:  No joint pain or swelling.  No decreased range of motion.  No back pain. ? ? ? ?Physical Exam ? ?BP 120/60 (BP Location: Left Arm, Patient Position: Sitting, Cuff Size: Normal)   Pulse 94   Temp 97.8 ?F (36.6 ?C) (Oral)  Ht '5\' 3"'$  (1.6 m)   Wt 134 lb 6.4 oz (61 kg)   SpO2 94%   BMI 23.81 kg/m?  ? ?GEN: A/Ox3; pleasant , NAD, well nourished  ?  ?HEENT:  [place]/AT,   NOSE-clear, THROAT-clear, no lesions, no postnasal drip or exudate noted.  ? ?NECK:  Supple w/ fair ROM; no JVD; normal carotid impulses w/o bruits; no thyromegaly or nodules palpated; no lymphadenopathy.   ? ?RESP  Clear  P & A; w/o, wheezes/ rales/ or rhonchi. no accessory muscle use, no dullness to percussion ? ?CARD:  RRR, no m/r/g, no peripheral edema, pulses intact, no cyanosis or clubbing. ? ?GI:   Soft & nt; nml bowel sounds; no organomegaly or masses detected.  ? ?Musco: Warm bil, no deformities or joint swelling noted.  ? ?Neuro: alert, no focal deficits noted.   ? ?Skin: Warm, no lesions or rashes ? ? ? ?Lab Results: ? ?CBC ? ? ?BMET ? ? ?BNP ?No results found for: BNP ? ?ProBNP ?No results found for: PROBNP ? ?Imaging: ?CT Chest Wo Contrast ? ?Result Date: 02/26/2022 ?CLINICAL DATA:  Pulmonary nodule EXAM: CT CHEST WITHOUT CONTRAST TECHNIQUE: Multidetector CT imaging of the chest was performed following the standard protocol without IV contrast. RADIATION DOSE REDUCTION: This exam was performed according to the departmental dose-optimization program which includes automated exposure  control, adjustment of the mA and/or kV according to patient size and/or use of iterative reconstruction technique. COMPARISON:  CT chest without contrast dated Mar 06, 2021 FINDINGS: Cardiovascular: Cardiomegaly. Left

## 2022-03-10 NOTE — Assessment & Plan Note (Signed)
CT chest shows persistent right upper lobe lung nodule 1.3 cm we will check a  PET scan for better evaluation-if hypermetabolic we will discuss case with Dr. Lamonte Sakai to see if tissue sampling is indicated.  This is a peripheral nodule-depending on PET could consider a CT-guided biopsy.  Patient does have significant emphysema. ?

## 2022-03-10 NOTE — Assessment & Plan Note (Addendum)
Cont on CPAP At bedtime   ?follow up with neuro  ?

## 2022-03-10 NOTE — Patient Instructions (Addendum)
Set up for PET scan.  ?Continue on Stiolto 2 puffs daily.  ?Albuterol inhaler As needed   ?Labs today  ?Aspiration swallow precautions/GERD precautions  ?Activity as tolerated.  ?Follow up with Dr. Lamonte Sakai  In 4-6 weeks and As needed   ?Please contact office for sooner follow up if symptoms do not improve or worsen or seek emergency care  ? ? ?

## 2022-03-11 DIAGNOSIS — L570 Actinic keratosis: Secondary | ICD-10-CM | POA: Diagnosis not present

## 2022-03-11 DIAGNOSIS — D1801 Hemangioma of skin and subcutaneous tissue: Secondary | ICD-10-CM | POA: Diagnosis not present

## 2022-03-11 DIAGNOSIS — L814 Other melanin hyperpigmentation: Secondary | ICD-10-CM | POA: Diagnosis not present

## 2022-03-11 DIAGNOSIS — L821 Other seborrheic keratosis: Secondary | ICD-10-CM | POA: Diagnosis not present

## 2022-03-11 DIAGNOSIS — D692 Other nonthrombocytopenic purpura: Secondary | ICD-10-CM | POA: Diagnosis not present

## 2022-03-11 DIAGNOSIS — L720 Epidermal cyst: Secondary | ICD-10-CM | POA: Diagnosis not present

## 2022-03-11 DIAGNOSIS — Z85828 Personal history of other malignant neoplasm of skin: Secondary | ICD-10-CM | POA: Diagnosis not present

## 2022-03-11 LAB — BRAIN NATRIURETIC PEPTIDE: Pro B Natriuretic peptide (BNP): 210 pg/mL — ABNORMAL HIGH (ref 0.0–100.0)

## 2022-03-11 NOTE — Progress Notes (Signed)
ATC x1.  No answer.  LVM to return call.

## 2022-03-12 ENCOUNTER — Encounter: Payer: Self-pay | Admitting: Cardiology

## 2022-03-12 NOTE — Telephone Encounter (Signed)
Holly Needles, NP  ?03/11/2022 10:18 AM EDT   ?  ?Blood work shows CBC is normal with no sign of anemia. ?CHF marker is slightly elevated at 210.  Make sure she is taking her Lasix as prescribed.  Would take 1 extra Lasix today x1 dose. ?Continue with office visit recommendations and follow-up  ?Called and spoke with patient. She stated that she is taking the Lasix 3 times a week, normally on Mondays, Wednesdays and Thursdays. She will take an extra Lasix today. Reminded her of her PET scan on 05/18 and her OV with RB next month.  ? ?Nothing further needed at time of call.  ?

## 2022-03-12 NOTE — Telephone Encounter (Signed)
Left message to call back  

## 2022-03-13 NOTE — Telephone Encounter (Signed)
Pt returned my call. ?Reports "extreme tiredness, SOB and muscle aches since starting Tikosyn. ?States it is very difficult to her to do anything.  ?Aware I am forwarding to the AFib clinic for advisement and possible OV to discuss options (scheduled to see Renee at end of the month). ?Pt agreeable to plan. ?

## 2022-03-14 ENCOUNTER — Ambulatory Visit (HOSPITAL_COMMUNITY)
Admission: RE | Admit: 2022-03-14 | Discharge: 2022-03-14 | Disposition: A | Discharge status: Home / Self Care | Payer: Medicare Other | Source: Ambulatory Visit | Attending: Physician Assistant | Admitting: Physician Assistant

## 2022-03-14 ENCOUNTER — Encounter (HOSPITAL_COMMUNITY): Payer: Self-pay | Admitting: Physician Assistant

## 2022-03-14 VITALS — BP 136/74 | HR 50 | Ht 63.0 in | Wt 134.6 lb

## 2022-03-14 DIAGNOSIS — J449 Chronic obstructive pulmonary disease, unspecified: Secondary | ICD-10-CM | POA: Diagnosis not present

## 2022-03-14 DIAGNOSIS — G4733 Obstructive sleep apnea (adult) (pediatric): Secondary | ICD-10-CM | POA: Insufficient documentation

## 2022-03-14 DIAGNOSIS — Z9989 Dependence on other enabling machines and devices: Secondary | ICD-10-CM | POA: Insufficient documentation

## 2022-03-14 DIAGNOSIS — Z87891 Personal history of nicotine dependence: Secondary | ICD-10-CM | POA: Diagnosis not present

## 2022-03-14 DIAGNOSIS — I4819 Other persistent atrial fibrillation: Secondary | ICD-10-CM | POA: Diagnosis not present

## 2022-03-14 DIAGNOSIS — Z7901 Long term (current) use of anticoagulants: Secondary | ICD-10-CM | POA: Diagnosis not present

## 2022-03-14 DIAGNOSIS — D6869 Other thrombophilia: Secondary | ICD-10-CM | POA: Diagnosis not present

## 2022-03-14 DIAGNOSIS — I10 Essential (primary) hypertension: Secondary | ICD-10-CM | POA: Diagnosis not present

## 2022-03-14 DIAGNOSIS — Z79899 Other long term (current) drug therapy: Secondary | ICD-10-CM | POA: Diagnosis not present

## 2022-03-14 MED ORDER — CARVEDILOL 6.25 MG PO TABS: 3.1250 mg | ORAL_TABLET | Freq: Two times a day (BID) | ORAL | 3 refills | Status: DC

## 2022-03-14 MED ORDER — CARVEDILOL 3.125 MG PO TABS: 3.1250 mg | ORAL_TABLET | Freq: Two times a day (BID) | ORAL | 1 refills | Status: DC

## 2022-03-14 NOTE — Progress Notes (Signed)
? ? ?Primary Care Physician: Michael Boston, MD ?Primary Cardiologist: Dr Debara Pickett ?Primary Electrophysiologist: Dr Curt Bears ?Referring Physician: Dr Curt Bears ? ? ?Holly Benson is a 88 y.o. female with a history of HTN, atrial flutter, HLD, OSA, COPD, PAD, and atrial fibrillation who presents for follow up in the [facility]. She was previously on amiodarone but this was discontinued due to concern about possible lung toxicity. She states that over the last few weeks she has had frequent "fluttering" sensations in her chest which last 10-15 minutes but can happen multiple times per day. She states that this feels "totally different" from her afib in the past. She denies any other symptoms. Patient is on Eliquis for a CHADS2VASC score of 5. ? ?When she was seen July 2022, tikosyn was discussed but pt was not ready to commit. Now s/p dofetilide admission 4/18-4/21/23.  ? ?On follow up today, patient reports that since starting dofetilide she has felt more weak in her legs and more dyspnea on exertion. She is in SR today. She has a Jodelle Red which has shown SR. No bleeding issues on anticoagulation. Of note, she is scheduled for a PET scan to evaluate a lung nodule.  ? ?Today, she denies symptoms of palpitations, chest pain, orthopnea, PND, lower extremity edema, dizziness, presyncope, syncope, snoring, daytime somnolence, bleeding, or neurologic sequela. The patient is tolerating medications without difficulties and is otherwise without complaint today.  ? ? ?Atrial Fibrillation Risk Factors: ? ?she does have symptoms or diagnosis of sleep apnea. ?she is compliant with CPAP therapy. ?she does not have a history of rheumatic fever. ? ? ?she has a BMI of Body mass index is 23.84 kg/m?Marland KitchenMarland Kitchen ?Filed Weights  ? 03/14/22 0951  ?Weight: 61.1 kg  ? ? ? ?Family History  ?Problem Relation Age of Onset  ? Suicidality Father   ? Stroke Maternal Grandfather   ? Hypertension Sister   ? Colon cancer Neg Hx   ?  Esophageal cancer Neg Hx   ? Pancreatic cancer Neg Hx   ? Stomach cancer Neg Hx   ? Liver disease Neg Hx   ? ? ? ?Atrial Fibrillation Management history: ? ?Previous antiarrhythmic drugs: flecainide, amiodarone, dofetilide   ?Previous cardioversions: none ?Previous ablations: none ?CHADS2VASC score: 5 ?Anticoagulation history: Eliquis ? ? ?Past Medical History:  ?Diagnosis Date  ? Arrhythmia   ? Arthritis   ? Atrial fibrillation ([place])   ? Constipation   ? chronic  ? COPD (chronic obstructive pulmonary disease) ([place])   ? Hyperlipidemia   ? Hypertension   ? pulmonary  ? OSA on CPAP   ? Osteoporosis   ? RLS (restless legs syndrome)   ? Sleep apnea   ? ?Past Surgical History:  ?Procedure Laterality Date  ? ABDOMINAL AORTOGRAM W/LOWER EXTREMITY N/A 03/21/2020  ? Procedure: ABDOMINAL AORTOGRAM W/ Bilateral LOWER EXTREMITY Runoff;  Surgeon: Wellington Hampshire, MD;  Location: [facility];  Service: Cardiovascular;  Laterality: N/A;  ? ABDOMINAL HYSTERECTOMY    ? arm surgery Right   ? Broken arm and has a plate in it  ? CATARACT EXTRACTION Bilateral   ? COLONOSCOPY    ? More than 10 years ago In [facility]  ? KNEE ARTHROSCOPY Right   ? PERIPHERAL VASCULAR INTERVENTION Bilateral 03/21/2020  ? Procedure: PERIPHERAL VASCULAR INTERVENTION;  Surgeon: Wellington Hampshire, MD;  Location: [facility];  Service: Cardiovascular;  Laterality: Bilateral;  external iliac  ? ? ?Current Outpatient Medications  ?Medication  Sig Dispense Refill  ? acetaminophen (TYLENOL) 325 MG tablet Take 650 mg by mouth as needed for moderate pain or headache.    ? albuterol (VENTOLIN HFA) 108 (90 Base) MCG/ACT inhaler Inhale 2 puffs into the lungs every 6 (six) hours as needed for wheezing or shortness of breath. 18 g 6  ? apixaban (ELIQUIS) 5 MG TABS tablet Take 1 tablet (5 mg total) by mouth 2 (two) times daily. 180 tablet 1  ? Carboxymethylcellulose Sodium (THERATEARS OP) Place 1 drop into both eyes daily as needed (dry eyes).    ?  carvedilol (COREG) 6.25 MG tablet Take 1 tablet (6.25 mg total) by mouth 2 (two) times daily. 180 tablet 3  ? cetirizine (ZYRTEC) 10 MG tablet Take 10 mg by mouth daily as needed for allergies.     ? clonazePAM (KLONOPIN) 0.5 MG tablet TAKE 1 TABLET AT BEDTIME ASNEEDED FOR RLS 30 tablet 1  ? diltiazem (CARDIZEM SR) 60 MG 12 hr capsule TAKE ONE CAPSULE BY MOUTH TWICE A DAY 180 capsule 3  ? dofetilide (TIKOSYN) 250 MCG capsule Take 1 capsule (250 mcg total) by mouth 2 (two) times daily. 180 capsule 1  ? furosemide (LASIX) 20 MG tablet Take one tablet by mouth on Monday, Wednesday and Friday    ? gabapentin (NEURONTIN) 100 MG capsule Take 1 capsule by mouth in the morning and 2 capsules in the evening. 90 capsule 5  ? irbesartan (AVAPRO) 75 MG tablet Take 1 tablet (75 mg total) by mouth daily. 90 tablet 3  ? Misc Natural Products (COLON CLEANSE) CAPS Take 4 capsules by mouth as needed (constipation).    ? polyethylene glycol (MIRALAX / GLYCOLAX) 17 g packet Take 17 g by mouth daily as needed for moderate constipation.    ? rOPINIRole (REQUIP) 0.25 MG tablet Take 1 tablet in the morning and three tablets after dinner po (Patient taking differently: Take 0.25-0.75 mg by mouth in the morning and at bedtime. Take 1 tablet in the morning and three tablets after dinner po) 360 tablet 3  ? STIOLTO RESPIMAT 2.5-2.5 MCG/ACT AERS USE 2 INHALATIONS ORALLY   EVERY MORNING 12 g 1  ? ?No current facility-administered medications for this encounter.  ? ? ?Allergies  ?Allergen Reactions  ? Lovenox [Enoxaparin Sodium] Hives and Rash  ?  PT STATES SHE BROKE OUT IN A RASH HEAD TO TOE AND LASTED ABOUT 3 WEEKS   ? Metoprolol Succinate [Metoprolol] Other (See Comments)  ?  Muscle aches, hand pain, and tingling  ? ? ?Social History  ? ?Socioeconomic History  ? Marital status: Widowed  ?  Spouse name: Not on file  ? Number of children: 3  ? Years of education: Not on file  ? Highest education level: Not on file  ?Occupational History  ?  Occupation: retired  ?  Comment: retired Licensed conveyancer  ?Tobacco Use  ? Smoking status: Former  ?  Packs/day: 1.50  ?  Years: 30.00  ?  Pack years: 45.00  ?  Types: Cigarettes  ?  Start date: 60  ?  Quit date: 11/04/1991  ?  Years since quitting: 30.3  ? Smokeless tobacco: Never  ? Tobacco comments:  ?  Former smoker 03/14/22  ?Vaping Use  ? Vaping Use: Never used  ?Substance and Sexual Activity  ? Alcohol use: Yes  ?  Alcohol/week: 7.0 standard drinks  ?  Types: 7 Glasses of wine per week  ?  Comment: 1 glass of wine nightly 03/14/22  ?  Drug use: No  ? Sexual activity: Not Currently  ?Other Topics Concern  ? Not on file  ?Social History Narrative  ? Lives with daughter  ? ?Social Determinants of Health  ? ?Financial Resource Strain: Not on file  ?Food Insecurity: Not on file  ?Transportation Needs: Not on file  ?Physical Activity: Not on file  ?Stress: Not on file  ?Social Connections: Not on file  ?Intimate Partner Violence: Not on file  ? ? ? ?ROS- All systems are reviewed and negative except as per the HPI above. ? ?Physical Exam: ?Vitals:  ? 03/14/22 0951  ?BP: 136/74  ?Pulse: (!) 50  ?SpO2: 97%  ?Weight: 61.1 kg  ?Height: '5\' 3"'$  (1.6 m)  ? ? ? ?GEN- The patient is a well appearing elderly female, alert and oriented x 3 today.   ?HEENT-head normocephalic, atraumatic, sclera clear, conjunctiva pink, hearing intact, trachea midline. ?Lungs- Clear to ausculation bilaterally, normal work of breathing ?Heart- Regular rate and rhythm, bradycardia, no murmurs, rubs or gallops  ?GI- soft, NT, ND, + BS ?Extremities- no clubbing, cyanosis, or edema ?MS- no significant deformity or atrophy ?Skin- no rash or lesion ?Psych- euthymic mood, full affect ?Neuro- strength and sensation are intact ? ? ?Wt Readings from Last 3 Encounters:  ?03/14/22 61.1 kg  ?03/10/22 61 kg  ?02/28/22 62.8 kg  ? ? ?EKG today demonstrates  ?SB ?Vent. rate 50 BPM ?PR interval 170 ms ?QRS duration 94 ms ?QT/QTcB 468/426 ms ? ? ?Epic records are reviewed  at length today ? ? ?Echo- 10/22/21-  1. Left ventricular ejection fraction, by estimation, is 65 to 70%. The left ventricle has normal function. The left ventricle has no regional wall motion abnormalities. Left ventric

## 2022-03-14 NOTE — Patient Instructions (Signed)
Decrease coreg to 1/2 tablet twice a day ?

## 2022-03-19 DIAGNOSIS — J449 Chronic obstructive pulmonary disease, unspecified: Secondary | ICD-10-CM | POA: Diagnosis not present

## 2022-03-19 DIAGNOSIS — R202 Paresthesia of skin: Secondary | ICD-10-CM | POA: Diagnosis not present

## 2022-03-19 DIAGNOSIS — I480 Paroxysmal atrial fibrillation: Secondary | ICD-10-CM | POA: Diagnosis not present

## 2022-03-19 DIAGNOSIS — K439 Ventral hernia without obstruction or gangrene: Secondary | ICD-10-CM | POA: Diagnosis not present

## 2022-03-19 DIAGNOSIS — G720 Drug-induced myopathy: Secondary | ICD-10-CM | POA: Diagnosis not present

## 2022-03-19 DIAGNOSIS — I1310 Hypertensive heart and chronic kidney disease without heart failure, with stage 1 through stage 4 chronic kidney disease, or unspecified chronic kidney disease: Secondary | ICD-10-CM | POA: Diagnosis not present

## 2022-03-19 DIAGNOSIS — K5904 Chronic idiopathic constipation: Secondary | ICD-10-CM | POA: Diagnosis not present

## 2022-03-19 DIAGNOSIS — M810 Age-related osteoporosis without current pathological fracture: Secondary | ICD-10-CM | POA: Diagnosis not present

## 2022-03-19 DIAGNOSIS — I739 Peripheral vascular disease, unspecified: Secondary | ICD-10-CM | POA: Diagnosis not present

## 2022-03-19 DIAGNOSIS — I2729 Other secondary pulmonary hypertension: Secondary | ICD-10-CM | POA: Diagnosis not present

## 2022-03-19 DIAGNOSIS — D6869 Other thrombophilia: Secondary | ICD-10-CM | POA: Diagnosis not present

## 2022-03-20 ENCOUNTER — Ambulatory Visit (HOSPITAL_COMMUNITY)
Admission: RE | Admit: 2022-03-20 | Discharge: 2022-03-20 | Disposition: A | Discharge status: Home / Self Care | Payer: Medicare Other | Source: Ambulatory Visit | Attending: Adult Health | Admitting: Adult Health

## 2022-03-20 DIAGNOSIS — I2510 Atherosclerotic heart disease of native coronary artery without angina pectoris: Secondary | ICD-10-CM | POA: Diagnosis not present

## 2022-03-20 DIAGNOSIS — I700 Atherosclerosis of aorta: Secondary | ICD-10-CM | POA: Diagnosis not present

## 2022-03-20 DIAGNOSIS — K439 Ventral hernia without obstruction or gangrene: Secondary | ICD-10-CM | POA: Diagnosis not present

## 2022-03-20 DIAGNOSIS — R911 Solitary pulmonary nodule: Secondary | ICD-10-CM | POA: Diagnosis not present

## 2022-03-20 DIAGNOSIS — J432 Centrilobular emphysema: Secondary | ICD-10-CM | POA: Diagnosis not present

## 2022-03-20 LAB — GLUCOSE, CAPILLARY: Glucose-Capillary: 102 mg/dL — ABNORMAL HIGH (ref 70–99)

## 2022-03-20 MED ORDER — FLUDEOXYGLUCOSE F - 18 (FDG) INJECTION
6.6000 | Freq: Once | INTRAVENOUS | Status: AC
  Administered 2022-03-20: 6.7 via INTRAVENOUS

## 2022-03-24 ENCOUNTER — Telehealth: Payer: Self-pay | Admitting: Gastroenterology

## 2022-03-24 NOTE — Telephone Encounter (Signed)
Mychart message sent by pt: Holly Benson [facility] (supporting Parrett, Tammy S, NP) 19 hours ago (8:36 PM)   Thinking back on our conversation, I have a couple of questions.  Barring something unforeseen, what is the usual recovery time from the procedure?  Is overnight hospitalization normal?  As I have thought about it over the weekend I think I would like to go ahead and get it over with.  I have right many things planned in July and August and would rather not have this hanging over me. Also, if my daughter-in-law has questions, could we set up a time for her to call you?  If not, I understand.  So if Dr. Lamonte Sakai still had the June opening I'd like to get on the schedule. Thanks so much.    Dr. Lamonte Sakai, please advise.

## 2022-03-24 NOTE — Telephone Encounter (Signed)
Assured by the patient she is passing flatus today. She understands to go with a liquid diet today. She will report to the ED if she acutely worsens, is unable to pass flatus or has other concerning symptoms. She declines to schedule an appointment at this time. She is expecting company and will call back.

## 2022-03-24 NOTE — Telephone Encounter (Signed)
Spoke with the patient. She started a Low FODMAP diet 2 weeks ago. Her PCP recommended this due to her complaint of excessive intestinal gas. "Everything gives me gas."  She calls today because she is experiencing abdominal discomfort and constipation. Her abdominal discomfort comes in contraction like waves. She is tender on the edges of where she believes her ventral hernia to be. She takes Miralax 2 doses daily. Her last good bowel movement was Friday. She states the Miralax purge did not seem to be effective for her last time, so she has chosen not to utilize it this time and instead will use the suppository. She is using a bisacodyl suppository.

## 2022-03-24 NOTE — Telephone Encounter (Signed)
Please check if she is passing flatus ? If not, will need to come to ER to exclude obstruction or strangulation of the hernia given she is experiencing worsening abdominal bloating and pain near the ventral hernia with no BM.  If passing flatus, stay on clear liquids and continue Miralax bowel purge. Urgent follow up office visit next available appointment with APP

## 2022-03-24 NOTE — Telephone Encounter (Signed)
Patient called requested to speak with a nurse said she is still having the same issues from her last visit. Please advise.

## 2022-03-25 NOTE — Telephone Encounter (Signed)
Please advise patient to continue using twice daily Dulcolax suppositories and drink 4 capfuls of MiraLAX mixed in 32 ounce of Gatorade daily until she feels she has completely evacuated.  Continue with low residue diet as tolerated.  Thank you

## 2022-03-25 NOTE — Telephone Encounter (Signed)
Spoke with the patient. Reviewed instructions. Reviewed low residue diet. Patient agrees to this plan. She will call in a few days with her progress.

## 2022-03-25 NOTE — Telephone Encounter (Signed)
Patient calls with an update. She did not have any stool produced after the suppository last night. She expelled "a bunch of gas." She reports her abdomen is soft and non-tender. The area of the hernia is tender "if I poke it." She does not have any pain while performing her daily activities. She reports she does have the urge to move her bowels, but is unable to produce stool. Does not want to go to the ER. Please advise.

## 2022-03-25 NOTE — Telephone Encounter (Signed)
Patient is returning your call.  

## 2022-03-26 NOTE — Telephone Encounter (Signed)
Will discuss with her at her planned OV

## 2022-03-31 NOTE — Progress Notes (Unsigned)
Cardiology Office Note Date:  03/31/2022  Patient ID:  Holly Benson, Holly Benson [birth year], MRN [id number] PCP:  Michael Boston, MD  Cardiologist:  Dr. Debara Pickett Electrophysiologist: Dr. Curt Bears    Chief Complaint: *** 1 mo follow up Tikosyn  History of Present Illness: Holly Benson is a 88 y.o. female with history of HTN, HLD, mixed CM, OSA w/CPAP, COPD, PAfib, flutter, PVD s/p b/l iliac stenting may 2021.   She come sin today to be seen for Dr. Curt Bears s/p Tikosyn load.  She arrived on SR and did not require DCCV.  Discharged 02/21/22.  She saw Afib clinic 02/28/22, not feeling any improved or different in SR/on drug. She was mildly bradycardic, noted some edema, and her dilt stopped QTc was good and continued  She was seen again in the Afib clinic with increasing fatigue/weakness.  She was in SR and had been maintaining SR by her Kardia tracings. She was still on the diltiazem, HR 50, she did not stop it (?unclear why), planned to reduce her coreg to 3.125. She was going to continue dofetilide, though if not feeling ant better at her 45momark, she would prefer to stop AAD and manage with rate control strategy.  *** Tikosyn EKG, labs *** symptoms, fatigue? BLoletha Grayer *** is she on dilt or not?  Coreg dose? *** eliquis, dose?   AFib Hx Unclear when diagnosed, [place]: Flecainide started Sep 2020 > despite converting to SR she had increased SOB on drug and was stopped. Amiodarone started Oct 2020 >> stopped May 2022 seems with concers of possible pulm toxicity Tikosyn started 02/2022   Past Medical History:  Diagnosis Date   Arrhythmia    Arthritis    Atrial fibrillation ([facility])    Constipation    chronic   COPD (chronic obstructive pulmonary disease) ([facility])    Hyperlipidemia    Hypertension    pulmonary   OSA on CPAP    Osteoporosis    RLS (restless legs syndrome)    Sleep apnea     Past Surgical History:  Procedure  Laterality Date   ABDOMINAL AORTOGRAM W/LOWER EXTREMITY N/A 03/21/2020   Procedure: ABDOMINAL AORTOGRAM W/ Bilateral LOWER EXTREMITY Runoff;  Surgeon: AWellington Hampshire MD;  [facility];  Service: Cardiovascular;  Laterality: N/A;   ABDOMINAL HYSTERECTOMY     arm surgery Right    Broken arm and has a plate in it   CATARACT EXTRACTION Bilateral    [place]    PERIPHERAL VASCULAR INTERVENTION Bilateral 03/21/2020   Procedure: PERIPHERAL VASCULAR INTERVENTION;  Surgeon: AWellington Hampshire MD;  [facility];  Service: Cardiovascular;  Laterality: Bilateral;  external iliac    Current Outpatient Medications  Medication Sig Dispense Refill   acetaminophen (TYLENOL) 325 MG tablet Take 650 mg by mouth as needed for moderate pain or headache.     albuterol (VENTOLIN HFA) 108 (90 Base) MCG/ACT inhaler Inhale 2 puffs into the lungs every 6 (six) hours as needed for wheezing or shortness of breath. 18 g 6   apixaban (ELIQUIS) 5 MG TABS tablet Take 1 tablet (5 mg total) by mouth 2 (two) times daily. 180 tablet 1   Carboxymethylcellulose Sodium (THERATEARS OP) Place 1 drop into both eyes daily as needed (dry eyes).     carvedilol (COREG) 3.125 MG tablet  Take 1 tablet (3.125 mg total) by mouth 2 (two) times daily. 60 tablet 1   cetirizine (ZYRTEC) 10 MG tablet Take 10 mg by mouth daily as needed for allergies.      clonazePAM (KLONOPIN) 0.5 MG tablet TAKE 1 TABLET AT BEDTIME ASNEEDED FOR RLS 30 tablet 1   diltiazem (CARDIZEM SR) 60 MG 12 hr capsule TAKE ONE CAPSULE BY MOUTH TWICE A DAY 180 capsule 3   dofetilide (TIKOSYN) 250 MCG capsule Take 1 capsule (250 mcg total) by mouth 2 (two) times daily. 180 capsule 1   furosemide (LASIX) 20 MG tablet Take one tablet by mouth on Monday, Wednesday and Friday     gabapentin (NEURONTIN) 100 MG capsule Take 1 capsule by mouth in the morning and 2 capsules in the  evening. 90 capsule 5   irbesartan (AVAPRO) 75 MG tablet Take 1 tablet (75 mg total) by mouth daily. 90 tablet 3   Misc Natural Products (COLON CLEANSE) CAPS Take 4 capsules by mouth as needed (constipation).     polyethylene glycol (MIRALAX / GLYCOLAX) 17 g packet Take 17 g by mouth daily as needed for moderate constipation.     rOPINIRole (REQUIP) 0.25 MG tablet Take 1 tablet in the morning and three tablets after dinner po (Patient taking differently: Take 0.25-0.75 mg by mouth in the morning and at bedtime. Take 1 tablet in the morning and three tablets after dinner po) 360 tablet 3   STIOLTO RESPIMAT 2.5-2.5 MCG/ACT AERS USE 2 INHALATIONS ORALLY   EVERY MORNING 12 g 1   No current facility-administered medications for this visit.    Allergies:   Lovenox [enoxaparin sodium] and Metoprolol succinate [metoprolol]   Social History:  The patient  reports that she quit smoking about 30 years ago. Her smoking use included cigarettes. She started smoking about 67 years ago. She has a 45.00 pack-year smoking history. She has never used smokeless tobacco. She reports current alcohol use of about 7.0 standard drinks per week. She reports that she does not use drugs.   Family History:  The patient's family history includes Hypertension in her sister; Stroke in her maternal grandfather; Suicidality in her father.  ROS:  Please see the history of present illness.    All other systems are reviewed and otherwise negative.   PHYSICAL EXAM:  VS:  There were no vitals taken for this visit. BMI: There is no height or weight on file to calculate BMI. Well nourished, well developed, in no acute distress  HEENT: normocephalic, atraumatic  Neck: no JVD, carotid bruits or masses Cardiac:  *** RRR; no significant murmurs, no rubs, or gallops Lungs:  *** CTA b/l, no wheezing, rhonchi or rales  Abd: soft, nontender MS: arthritic deformities of her hands, age appropriate atrophy Ext: *** trace edema  Skin: warm  and dry, no rash Neuro:  No gross deficits appreciated Psych: euthymic mood, full affect    EKG done today and reviewed by myself: ***   10/21/2021: TTE 1. Left ventricular ejection fraction, by estimation, is 65 to 70%. The  left ventricle has normal function. The left ventricle has no regional  wall motion abnormalities. Left ventricular diastolic function could not  be evaluated. The average left  ventricular global longitudinal strain is -10.5 %. The global longitudinal  strain is abnormal.   2. Right ventricular systolic function is moderately reduced. The right  ventricular size is moderately enlarged. There is mildly elevated  pulmonary artery systolic pressure. The estimated right ventricular  systolic pressure is 08.1 mmHg.   3. Left atrial size was severely dilated.   4. Right atrial size was severely dilated.   5. The mitral valve is normal in structure. Mild to moderate mitral valve  regurgitation. No evidence of mitral stenosis.   6. Tricuspid valve regurgitation is severe.   7. The aortic valve is normal in structure. Aortic valve regurgitation is  mild. No aortic stenosis is present.   8. The inferior vena cava is dilated in size with >50% respiratory  variability, suggesting right atrial pressure of 8 mmHg.   Comparison(s): EF 60%, RA & LA severe dilated, moderate TR.   April 2021, Zio AT Max 96 bpm 09:03am, 03/18 Min 48 bpm 04:29am, 03/20 Avg 66 bpm Less than 1% PACs and PVCs No atrial fibrillation noted Predominant rhythm was sinus rhythm Symptoms associated with sinus rhythm   12/08/2018: stress test The left ventricular ejection fraction is hyperdynamic (>65%). Nuclear stress EF: 71%. There was downsloping of the ST segments in the inferolateral leads during infusion with no significan J point depression which rapidly resolved in early recovery. The study is normal. This is a low risk study.   Echocardiogram 11/18/2017 Study Conclusions  - Left  ventricle: The cavity size was normal. Wall thickness was    increased in a pattern of mild LVH. Systolic function was normal.    The estimated ejection fraction was in the range of 60% to 65%.    Wall motion was normal; there were no regional wall motion    abnormalities. The study is not technically sufficient to allow    evaluation of LV diastolic function.  - Mitral valve: Mildly thickened leaflets . There was trivial    regurgitation.  - Left atrium: Moderately dilated.  - Right ventricle: The cavity size was mildly dilated.  - Right atrium: Moderately dilated.  - Tricuspid valve: There was moderate regurgitation.  - Pulmonary arteries: PA peak pressure: 51 mm Hg (S).  - Inferior vena cava: The vessel was dilated. The respirophasic    diameter changes were blunted (< 50%), consistent with elevated    central venous pressure.   Cardiac event monitor 03/10/2018 Sinus Bradycardia / Sinus Rhythm / Sinus Arrhythmia/ Sinus Tachycardia, with intermittent First Degree AV Block, ? Junctional Rhythm and 2 pauses over 2 seconds - this appears to be 2nd degree type II AV block.  PVC's, and PAC's noted. Ventricular Couplets, Trigeminy. Atrial Pairs, Runs, Trigeminy. Diary entries correlate with PVC's, and PAC's. No atrial flutter was noted.  Recent Labs: 09/02/2021: ALT 14 02/28/2022: BUN 20; Creatinine, Ser 0.73; Magnesium 2.3; Potassium 4.6; Sodium 137 03/10/2022: Hemoglobin 14.0; Platelets 318.0; Pro B Natriuretic peptide (BNP) 210.0  No results found for requested labs within last 8760 hours.   CrCl cannot be calculated (Patient's most recent lab result is older than the maximum 21 days allowed.).   Wt Readings from Last 3 Encounters:  03/14/22 134 lb 9.6 oz (61.1 kg)  03/10/22 134 lb 6.4 oz (61 kg)  02/28/22 138 lb 6.4 oz (62.8 kg)     Other studies reviewed: Additional studies/records reviewed today include: summarized above  ASSESSMENT AND PLAN:  1. Paroxysmal AFib/flutter is also  mentioned     CHA2DS2Vasc is 4, on Eliquis, *** appropriately dosed for weight/creat      *** Tikosyn w/ *** QTc     *** burden by symptoms      2. HTN     ***   3. PVD  C/w cardiology/ team, Dr. Fletcher Anon     *** No claudication  4. SOB     ***   Disposition: ***   Current medicines are reviewed at length with the patient today.  The patient did not have any concerns regarding medicines.  Venetia Night, PA-C 03/31/2022 1:21 PM     [place] [address] [phone] (office)  [phone] (fax)

## 2022-04-01 ENCOUNTER — Telehealth (HOSPITAL_COMMUNITY): Payer: Self-pay | Admitting: *Deleted

## 2022-04-01 ENCOUNTER — Encounter: Payer: Self-pay | Admitting: *Deleted

## 2022-04-01 ENCOUNTER — Ambulatory Visit (INDEPENDENT_AMBULATORY_CARE_PROVIDER_SITE_OTHER): Payer: Medicare Other | Admitting: Physician Assistant

## 2022-04-01 ENCOUNTER — Encounter: Payer: Self-pay | Admitting: Physician Assistant

## 2022-04-01 DIAGNOSIS — Z79899 Other long term (current) drug therapy: Secondary | ICD-10-CM | POA: Diagnosis not present

## 2022-04-01 DIAGNOSIS — Z5181 Encounter for therapeutic drug level monitoring: Secondary | ICD-10-CM | POA: Diagnosis not present

## 2022-04-01 DIAGNOSIS — I480 Paroxysmal atrial fibrillation: Secondary | ICD-10-CM

## 2022-04-01 DIAGNOSIS — R0609 Other forms of dyspnea: Secondary | ICD-10-CM | POA: Diagnosis not present

## 2022-04-01 NOTE — Telephone Encounter (Signed)
Patient given detailed instructions per Myocardial Perfusion Study Information Sheet for the test on 04/04/2022 at 10:30. Patient notified to arrive 15 minutes early and that it is imperative to arrive on time for appointment to keep from having the test rescheduled.  If you need to cancel or reschedule your appointment, please call the office within 24 hours of your appointment. . Patient verbalized understanding.Holly Benson

## 2022-04-01 NOTE — Patient Instructions (Addendum)
Medication Instructions:    Your physician recommends that you continue on your current medications as directed. Please refer to the Current Medication list given to you today.   *If you need a refill on your cardiac medications before your next appointment, please call your pharmacy*   Lab Work: [facility] AND [place]   If you have labs (blood work) drawn today and your tests are completely normal, you will receive your results only by: [place] (if you have MyChart) OR A paper copy in the mail If you have any lab test that is abnormal or we need to change your treatment, we will call you to review the results.   Testing/Procedures:  Your physician has requested that you have an echocardiogram. Echocardiography is a painless test that uses sound waves to create images of your heart. It provides your doctor with information about the size and shape of your heart and how well your heart's chambers and valves are working. This procedure takes approximately one hour. There are no restrictions for this procedure.   Your physician has requested that you have a lexiscan myoview. For further information please visit [email or web address]. Please follow instruction sheet, as given.    Follow-Up: At [facility], you and your health needs are our priority.  As part of our continuing mission to provide you with exceptional heart care, we have created designated Provider Care Teams.  These Care Teams include your primary Cardiologist (physician) and Advanced Practice Providers (APPs -  Physician Assistants and Nurse Practitioners) who all work together to provide you with the care you need, when you need it.  We recommend signing up for the patient portal called "MyChart".  Sign up information is provided on this After Visit Summary.  MyChart is used to connect with patients for Virtual Visits (Telemedicine).  Patients are able to view lab/test results, encounter notes, upcoming appointments, etc.   Non-urgent messages can be sent to your provider as well.   To learn more about what you can do with MyChart, go to [email or web address].    Your next appointment:   1 month(s)  The format for your next appointment:   In Person  Provider:   Tommye Standard, PA-C    Other Instructions   Important Information About Sugar

## 2022-04-02 LAB — BASIC METABOLIC PANEL
BUN/Creatinine Ratio: 21 (ref 12–28)
BUN: 19 mg/dL (ref 8–27)
CO2: 25 mmol/L (ref 20–29)
Calcium: 10 mg/dL (ref 8.7–10.3)
Chloride: 100 mmol/L (ref 96–106)
Creatinine, Ser: 0.91 mg/dL (ref 0.57–1.00)
Glucose: 98 mg/dL (ref 70–99)
Potassium: 4.7 mmol/L (ref 3.5–5.2)
Sodium: 138 mmol/L (ref 134–144)
eGFR: 61 mL/min/{1.73_m2} (ref >59)

## 2022-04-02 LAB — MAGNESIUM: Magnesium: 2.2 mg/dL (ref 1.6–2.3)

## 2022-04-04 ENCOUNTER — Ambulatory Visit (HOSPITAL_COMMUNITY): Payer: Medicare Other | Attending: Cardiology

## 2022-04-04 DIAGNOSIS — R0609 Other forms of dyspnea: Secondary | ICD-10-CM | POA: Insufficient documentation

## 2022-04-04 LAB — MYOCARDIAL PERFUSION IMAGING
Base ST Depression (mm): 0 mm
LV dias vol: 52 mL (ref 46–106)
LV sys vol: 19 mL
Nuc Stress EF: 64 %
Peak HR: 90 {beats}/min
Rest HR: 71 {beats}/min
Rest Nuclear Isotope Dose: 10.3 mCi
SDS: 0
SRS: 0
SSS: 0
ST Depression (mm): 0 mm
Stress Nuclear Isotope Dose: 31.9 mCi
TID: 0.99

## 2022-04-04 MED ORDER — TECHNETIUM TC 99M TETROFOSMIN IV KIT
31.9000 | PACK | Freq: Once | INTRAVENOUS | Status: AC | PRN
  Administered 2022-04-04: 31.9 via INTRAVENOUS

## 2022-04-04 MED ORDER — TECHNETIUM TC 99M TETROFOSMIN IV KIT
10.3000 | PACK | Freq: Once | INTRAVENOUS | Status: AC | PRN
  Administered 2022-04-04: 10.3 via INTRAVENOUS

## 2022-04-04 MED ORDER — TECHNETIUM TC 99M TETROFOSMIN IV KIT: 31.9000 | PACK | Freq: Once | INTRAVENOUS | Status: AC | PRN

## 2022-04-04 MED ORDER — REGADENOSON 0.4 MG/5ML IV SOLN
0.4000 mg | Freq: Once | INTRAVENOUS | Status: AC
  Administered 2022-04-04: 0.4 mg via INTRAVENOUS

## 2022-04-08 ENCOUNTER — Encounter: Payer: Self-pay | Admitting: Emergency Medicine

## 2022-04-08 ENCOUNTER — Encounter: Payer: Self-pay | Admitting: Cardiology

## 2022-04-08 ENCOUNTER — Ambulatory Visit (INDEPENDENT_AMBULATORY_CARE_PROVIDER_SITE_OTHER): Payer: Medicare Other | Admitting: Emergency Medicine

## 2022-04-08 VITALS — BP 136/74 | HR 69 | Temp 97.6°F | Ht 63.0 in | Wt 130.8 lb

## 2022-04-08 DIAGNOSIS — R9389 Abnormal findings on diagnostic imaging of other specified body structures: Secondary | ICD-10-CM

## 2022-04-08 DIAGNOSIS — R911 Solitary pulmonary nodule: Secondary | ICD-10-CM

## 2022-04-08 NOTE — Patient Instructions (Signed)
We reviewed your CT chest and PET scan today.  There is a small but enlarging pulmonary nodule in the right upper lobe.  We discussed the options for next steps including possible tissue biopsy, follow-up imaging. We will repeat your CT scan of the chest in mid August 2023 to do surveillance on the nodule. Please follow Dr. Lamonte Sakai in August after the CT so we can review the results together and plan next steps.

## 2022-04-08 NOTE — Progress Notes (Signed)
Subjective:    Patient ID: Holly Benson, female    DOB: 12/22/1932, 88 y.o.   MRN: [id number]  COPD She complains of cough and shortness of breath. There is no wheezing. Pertinent negatives include no ear pain, fever, headaches, postnasal drip, rhinorrhea, sneezing, sore throat or trouble swallowing. Her past medical history is significant for COPD.    ROV 03/15/21 --88 year old woman with history of COPD, GERD with concern for possible intermittent aspiration, allergic rhinitis, OSA/OHS on Servo vent (Choice).  She was treated for a slowly resolving exacerbation in mid to late March associated with imaging suggestive of pneumonia. CT chest 01/16/2021 with multiple irregular nodular foci of consolidation associated with mild to moderate bronchiectatic change similar but improved from a CT abdomen 2 weeks prior.  She was treated with antibiotics and clinically improved.  Of note her amiodarone was also discontinued. She notes a few heart flutters. No tachycardia to her knowledge. Her breathing is better. She is off fosamax, wonders about restarting  Remains on Stiolto uses albuterol . She is unsure whether she gets benefit from [facility], has been on it for 3 years.  On Zyrtec  CT chest 03/07/2021 reviewed by me, shows continued substantial improvement in her nodular infiltrates bilaterally.  There is still some mild residual bandlike opacity in the right lower lobe, lingula, left lower lobe clearly smaller than prior.  Pulmonary function testing performed today and reviewed by me shows mild obstruction without a bronchodilator response, normal lung volumes, decreased diffusion capacity that does not fully correct when adjusted for alveolar volume.   ROV 04/08/2022 --follow-up visit for 88 year old woman with a history of severe COPD, A Fib on dofetilide. GERD with concern for possible intermittent aspiration, OSA/OHS on the Servo vent, chronic rhinitis.  We have been following pulmonary nodular disease  on serial CT scans of the chest. Currently managed on Stiolto  Repeat CT scan of the chest for/25/23 reviewed by me, showed a 1.3 x 0.8 cm solid subpleural right upper lobe pulmonary nodule slightly increased in size compared with prior scan.  PET scan 03/20/2022 reviewed by me shows that the right upper lobe pulmonary nodule is hypermetabolic.  There are no mediastinal or hilar lymph nodes present.   Review of Systems  Constitutional:  Negative for fever and unexpected weight change.  HENT:  Negative for congestion, dental problem, ear pain, nosebleeds, postnasal drip, rhinorrhea, sinus pressure, sneezing, sore throat and trouble swallowing.   Eyes:  Negative for redness and itching.  Respiratory:  Positive for cough and shortness of breath. Negative for chest tightness and wheezing.   Cardiovascular:  Negative for palpitations and leg swelling.  Gastrointestinal:  Negative for nausea and vomiting.  Genitourinary:  Negative for dysuria.  Musculoskeletal:  Negative for joint swelling.  Skin:  Negative for rash.  Neurological:  Negative for headaches.  Hematological:  Does not bruise/bleed easily.  Psychiatric/Behavioral:  Negative for dysphoric mood. The patient is not nervous/anxious.       Objective:   Physical Exam Vitals:   04/08/22 1506  BP: 136/74  Pulse: 69  Temp: 97.6 F (36.4 C)  TempSrc: Oral  SpO2: 95%  Weight: 130 lb 12.8 oz (59.3 kg)  Height: '5\' 3"'$  (1.6 m)   Gen: Pleasant, thin woman, in no distress,  normal affect, mild kyphosis  ENT: No lesions,  mouth clear,  oropharynx clear, no postnasal drip  Neck: No JVD, no stridor  Lungs: No use of accessory muscles, few bibasilar inspiratory crackles  Cardiovascular: RRR, heart  sounds normal, no murmur or gallops, no peripheral edema  Musculoskeletal: joints of hands swollen, nodular  Neuro: alert, non focal  Skin: Warm, no lesions or rashes      Assessment & Plan:  Abnormal CT of the chest Based on slight  interval increase in size after 1 year, hypermetabolism on her PET scan suspect that this is a slow-growing non-small cell lung cancer.  Explained this to her today.  She understands.  Options include biopsy to facilitate SBRT, watchful waiting.  I do not think she is a good candidate for primary resection.  We will plan to repeat her CT in 3 months.  If the nodule persists, has not decreased in size then we will consider navigational bronchoscopy to get a tissue diagnosis to facilitate SBRT.  We reviewed your CT chest and PET scan today.  There is a small but enlarging pulmonary nodule in the right upper lobe.  We discussed the options for next steps including possible tissue biopsy, follow-up imaging. We will repeat your CT scan of the chest in mid August 2023 to do surveillance on the nodule. Please follow Dr. Lamonte Sakai in August after the CT so we can review the results together and plan next steps.  Baltazar Apo, MD, PhD 04/08/2022, 5:24 PM [facility] [phone] or if no answer [phone]

## 2022-04-08 NOTE — Assessment & Plan Note (Signed)
Based on slight interval increase in size after 1 year, hypermetabolism on her PET scan suspect that this is a slow-growing non-small cell lung cancer.  Explained this to her today.  She understands.  Options include biopsy to facilitate SBRT, watchful waiting.  I do not think she is a good candidate for primary resection.  We will plan to repeat her CT in 3 months.  If the nodule persists, has not decreased in size then we will consider navigational bronchoscopy to get a tissue diagnosis to facilitate SBRT.  We reviewed your CT chest and PET scan today.  There is a small but enlarging pulmonary nodule in the right upper lobe.  We discussed the options for next steps including possible tissue biopsy, follow-up imaging. We will repeat your CT scan of the chest in mid August 2023 to do surveillance on the nodule. Please follow Dr. Lamonte Sakai in August after the CT so we can review the results together and plan next steps.

## 2022-04-16 ENCOUNTER — Ambulatory Visit: Payer: Medicare Other | Admitting: Emergency Medicine

## 2022-04-17 ENCOUNTER — Ambulatory Visit (HOSPITAL_COMMUNITY): Payer: Medicare Other | Attending: Internal Medicine

## 2022-04-17 DIAGNOSIS — R0609 Other forms of dyspnea: Secondary | ICD-10-CM | POA: Diagnosis not present

## 2022-04-17 LAB — ECHOCARDIOGRAM COMPLETE
Area-P 1/2: 4.8 cm2
MV M vel: 3.9 m/s
MV Peak grad: 60.8 mmHg
P 1/2 time: 612 msec
S' Lateral: 2.7 cm

## 2022-04-26 ENCOUNTER — Emergency Department (HOSPITAL_BASED_OUTPATIENT_CLINIC_OR_DEPARTMENT_OTHER): Payer: Medicare Other

## 2022-04-26 ENCOUNTER — Encounter (HOSPITAL_BASED_OUTPATIENT_CLINIC_OR_DEPARTMENT_OTHER): Payer: Self-pay | Admitting: Emergency Medicine

## 2022-04-26 ENCOUNTER — Emergency Department (HOSPITAL_BASED_OUTPATIENT_CLINIC_OR_DEPARTMENT_OTHER)
Admission: EM | Admit: 2022-04-26 | Discharge: 2022-04-26 | Disposition: A | Discharge status: Home / Self Care | Payer: Medicare Other | Attending: Emergency Medicine | Admitting: Emergency Medicine

## 2022-04-26 ENCOUNTER — Other Ambulatory Visit: Payer: Self-pay

## 2022-04-26 DIAGNOSIS — K6389 Other specified diseases of intestine: Secondary | ICD-10-CM | POA: Diagnosis not present

## 2022-04-26 DIAGNOSIS — I10 Essential (primary) hypertension: Secondary | ICD-10-CM | POA: Insufficient documentation

## 2022-04-26 DIAGNOSIS — K5900 Constipation, unspecified: Secondary | ICD-10-CM | POA: Diagnosis not present

## 2022-04-26 DIAGNOSIS — J449 Chronic obstructive pulmonary disease, unspecified: Secondary | ICD-10-CM | POA: Insufficient documentation

## 2022-04-26 DIAGNOSIS — N281 Cyst of kidney, acquired: Secondary | ICD-10-CM | POA: Diagnosis not present

## 2022-04-26 DIAGNOSIS — R1084 Generalized abdominal pain: Secondary | ICD-10-CM

## 2022-04-26 DIAGNOSIS — K5730 Diverticulosis of large intestine without perforation or abscess without bleeding: Secondary | ICD-10-CM | POA: Diagnosis not present

## 2022-04-26 HISTORY — DX: Unspecified osteoarthritis, unspecified site: M19.90

## 2022-04-26 LAB — CBC WITH DIFFERENTIAL/PLATELET
Abs Immature Granulocytes: 0.03 10*3/uL (ref 0.00–0.07)
Basophils Absolute: 0 10*3/uL (ref 0.0–0.1)
Basophils Relative: 0 %
Eosinophils Absolute: 0 10*3/uL (ref 0.0–0.5)
Eosinophils Relative: 0 %
HCT: 47.1 % — ABNORMAL HIGH (ref 36.0–46.0)
Hemoglobin: 16.1 g/dL — ABNORMAL HIGH (ref 12.0–15.0)
Immature Granulocytes: 0 %
Lymphocytes Relative: 13 %
Lymphs Abs: 1.5 10*3/uL (ref 0.7–4.0)
MCH: 32.8 pg (ref 26.0–34.0)
MCHC: 34.2 g/dL (ref 30.0–36.0)
MCV: 95.9 fL (ref 80.0–100.0)
Monocytes Absolute: 1 10*3/uL (ref 0.1–1.0)
Monocytes Relative: 9 %
Neutro Abs: 8.9 10*3/uL — ABNORMAL HIGH (ref 1.7–7.7)
Neutrophils Relative %: 78 %
Platelets: 336 10*3/uL (ref 150–400)
RBC: 4.91 MIL/uL (ref 3.87–5.11)
RDW: 13.2 % (ref 11.5–15.5)
WBC: 11.5 10*3/uL — ABNORMAL HIGH (ref 4.0–10.5)
nRBC: 0 % (ref 0.0–0.2)

## 2022-04-26 LAB — COMPREHENSIVE METABOLIC PANEL
ALT: 19 U/L (ref 0–44)
AST: 21 U/L (ref 15–41)
Albumin: 4.7 g/dL (ref 3.5–5.0)
Alkaline Phosphatase: 112 U/L (ref 38–126)
Anion gap: 13 (ref 5–15)
BUN: 10 mg/dL (ref 8–23)
CO2: 22 mmol/L (ref 22–32)
Calcium: 9.6 mg/dL (ref 8.9–10.3)
Chloride: 99 mmol/L (ref 98–111)
Creatinine, Ser: 0.73 mg/dL (ref 0.44–1.00)
GFR, Estimated: >60 mL/min (ref >60)
Glucose, Bld: 116 mg/dL — ABNORMAL HIGH (ref 70–99)
Potassium: 3.6 mmol/L (ref 3.5–5.1)
Sodium: 134 mmol/L — ABNORMAL LOW (ref 135–145)
Total Bilirubin: 0.8 mg/dL (ref 0.3–1.2)
Total Protein: 7.5 g/dL (ref 6.5–8.1)

## 2022-04-26 LAB — LIPASE, BLOOD: Lipase: 11 U/L (ref 11–51)

## 2022-04-26 MED ORDER — SODIUM CHLORIDE 0.9 % IV BOLUS
500.0000 mL | Freq: Once | INTRAVENOUS | Status: AC
  Administered 2022-04-26: 500 mL via INTRAVENOUS

## 2022-04-26 MED ORDER — IOHEXOL 300 MG/ML  SOLN
100.0000 mL | Freq: Once | INTRAMUSCULAR | Status: AC | PRN
  Administered 2022-04-26: 100 mL via INTRAVENOUS

## 2022-04-26 NOTE — ED Provider Notes (Signed)
Emergency Department Provider Note   I have reviewed the triage vital signs and the nursing notes.   HISTORY  Chief Complaint Constipation   HPI Will Schier is a 88 y.o. female past medical history reviewed below presents emergency department with diffuse abdominal distention and bloating.  Patient has known abdominal hernia, and is not a surgical candidate, but has noticed some increased protrusion of this.  She was able to push it back in today but states frequently with constipation and bloating she finds that the hernia will stick out more than normal, which is the case today.  She recalls having flatus this morning but not since lunch.  She did have 2 very small bowel movements since lunch without blood.  She is not feeling significant rectal fullness or pain.  No fevers.  No nausea or vomiting. She takes laxatives and stool softeners regularly.    Past Medical History:  Diagnosis Date   Arrhythmia    Arthritis    Atrial fibrillation (HCC)    Constipation    chronic   COPD (chronic obstructive pulmonary disease) (HCC)    Hyperlipidemia    Hypertension    pulmonary   OSA on CPAP    Osteoarthritis    Osteoporosis    RLS (restless legs syndrome)    Sleep apnea     Review of Systems  Constitutional: No fever/chills Eyes: No visual changes. ENT: No sore throat. Cardiovascular: Denies chest pain. Respiratory: Denies shortness of breath. Gastrointestinal: Positive abdominal pain.  No nausea, no vomiting.  No diarrhea.  Positive constipation. Genitourinary: Negative for dysuria. Musculoskeletal: Negative for back pain. Skin: Negative for rash. Neurological: Negative for headaches, focal weakness or numbness.   ____________________________________________   PHYSICAL EXAM:  VITAL SIGNS: ED Triage Vitals  Enc Vitals Group     BP 04/26/22 1646 136/74     Pulse Rate 04/26/22 1646 64     Resp 04/26/22 1646 20     Temp 04/26/22 1646 98.6 F (37 C)     Temp  Source 04/26/22 1646 Oral     SpO2 04/26/22 1646 96 %   Constitutional: Alert and oriented. Well appearing and in no acute distress. Eyes: Conjunctivae are normal.  Head: Atraumatic. Nose: No congestion/rhinnorhea. Mouth/Throat: Mucous membranes are moist.   Neck: No stridor.   Cardiovascular: Normal rate, regular rhythm. Good peripheral circulation. Grossly normal heart sounds.   Respiratory: Normal respiratory effort.  No retractions. Lungs CTAB. Gastrointestinal: Soft with mild diffuse tenderness and soft ventral hernia without overlying skin changes. Positive distention.  Musculoskeletal: No lower extremity tenderness nor edema. No gross deformities of extremities. Neurologic:  Normal speech and language. No gross focal neurologic deficits are appreciated.  Skin:  Skin is warm, dry and intact. No rash noted.   ____________________________________________   LABS (all labs ordered are listed, but only abnormal results are displayed)  Labs Reviewed  COMPREHENSIVE METABOLIC PANEL - Abnormal; Notable for the following components:      Result Value   Sodium 134 (*)    Glucose, Bld 116 (*)    All other components within normal limits  CBC WITH DIFFERENTIAL/PLATELET - Abnormal; Notable for the following components:   WBC 11.5 (*)    Hemoglobin 16.1 (*)    HCT 47.1 (*)    Neutro Abs 8.9 (*)    All other components within normal limits  LIPASE, BLOOD   ____________________________________________   PROCEDURES  Procedure(s) performed:   Procedures  None ____________________________________________   INITIAL IMPRESSION /  ASSESSMENT AND PLAN / ED COURSE  Pertinent labs & imaging results that were available during my care of the patient were reviewed by me and considered in my medical decision making (see chart for details).   This patient is Presenting for Evaluation of abdominal pain, which does require a range of treatment options, and is a complaint that involves a high  risk of morbidity and mortality.  The Differential Diagnoses includes but is not exclusive to acute cholecystitis, intrathoracic causes for epigastric abdominal pain, gastritis, duodenitis, pancreatitis, small bowel or large bowel obstruction, incarcerated hernia, abdominal aortic aneurysm, hernia, gastritis, etc.   Critical Interventions-    Medications  sodium chloride 0.9 % bolus 500 mL ( Intravenous Stopped 04/26/22 2100)  iohexol (OMNIPAQUE) 300 MG/ML solution 100 mL (100 mLs Intravenous Contrast Given 04/26/22 1900)    Reassessment after intervention: Symptoms improved.    I did obtain Additional Historical Information from daughter at bedside.  I decided to review pertinent External Data, and in summary patient with GI appointment on Monday.   Clinical Laboratory Tests Ordered, included Lipase normal. No AKI. Normal LFTs. No anemia.   Radiologic Tests Ordered, included CT abdomen/pelvis. I independently interpreted the images and agree with radiology interpretation.   Cardiac Monitor Tracing which shows NSR.   Social Determinants of Health Risk patient with prior smoking history.   Medical Decision Making: Summary:  Patient presents emergency department for evaluation of diffuse abdominal pain and distention.  Differential includes constipation but also ileus, small bowel obstruction.  Medically the patient's hernia does not appear incarcerated.  Unable to reduce it but it then comes back out.  Plan for CT imaging to further assess.  Reevaluation with update and discussion with patient and daughter. Mass noted on CT. Clinically no evidence of incarceration of hernia. Patient has a follow up appointment on Monday with GI. Will discuss CT findings.   Considered admission but symptoms improved and patient has GI follow up on Monday. Defer imaging for now.   Disposition: discharge  ____________________________________________  FINAL CLINICAL IMPRESSION(S) / ED  DIAGNOSES  Final diagnoses:  Generalized abdominal pain  Constipation, unspecified constipation type  Colonic mass     Note:  This document was prepared using Dragon voice recognition software and may include unintentional dictation errors.  Nanda Quinton, MD, [facility]    Donevin Sainsbury, [facility], MD 04/30/22 [phone]

## 2022-04-28 ENCOUNTER — Ambulatory Visit (INDEPENDENT_AMBULATORY_CARE_PROVIDER_SITE_OTHER): Payer: Medicare Other | Admitting: Physician Assistant

## 2022-04-28 ENCOUNTER — Telehealth: Payer: Self-pay

## 2022-04-28 ENCOUNTER — Encounter: Payer: Self-pay | Admitting: Physician Assistant

## 2022-04-28 VITALS — BP 136/64 | HR 76 | Ht 62.0 in | Wt 128.5 lb

## 2022-04-28 DIAGNOSIS — R109 Unspecified abdominal pain: Secondary | ICD-10-CM

## 2022-04-28 DIAGNOSIS — R935 Abnormal findings on diagnostic imaging of other abdominal regions, including retroperitoneum: Secondary | ICD-10-CM

## 2022-04-28 DIAGNOSIS — K439 Ventral hernia without obstruction or gangrene: Secondary | ICD-10-CM | POA: Diagnosis not present

## 2022-04-28 MED ORDER — NA SULFATE-K SULFATE-MG SULF 17.5-3.13-1.6 GM/177ML PO SOLN: 1.0000 | Freq: Once | ORAL | 0 refills | Status: AC

## 2022-04-29 ENCOUNTER — Encounter: Payer: Self-pay | Admitting: Physician Assistant

## 2022-04-29 ENCOUNTER — Ambulatory Visit (INDEPENDENT_AMBULATORY_CARE_PROVIDER_SITE_OTHER): Payer: Medicare Other | Admitting: Physician Assistant

## 2022-04-29 ENCOUNTER — Other Ambulatory Visit (HOSPITAL_COMMUNITY): Payer: Self-pay

## 2022-04-29 VITALS — BP 100/42 | HR 57 | Ht 62.0 in | Wt 130.2 lb

## 2022-04-29 DIAGNOSIS — Z5181 Encounter for therapeutic drug level monitoring: Secondary | ICD-10-CM | POA: Diagnosis not present

## 2022-04-29 DIAGNOSIS — I480 Paroxysmal atrial fibrillation: Secondary | ICD-10-CM | POA: Diagnosis not present

## 2022-04-29 DIAGNOSIS — I10 Essential (primary) hypertension: Secondary | ICD-10-CM | POA: Diagnosis not present

## 2022-04-29 DIAGNOSIS — Z79899 Other long term (current) drug therapy: Secondary | ICD-10-CM | POA: Diagnosis not present

## 2022-04-29 MED ORDER — CARVEDILOL 3.125 MG PO TABS: 3.1250 mg | ORAL_TABLET | Freq: Two times a day (BID) | ORAL | 6 refills | Status: DC

## 2022-04-30 LAB — POTASSIUM: Potassium: 4.6 mmol/L (ref 3.5–5.2)

## 2022-04-30 LAB — MAGNESIUM: Magnesium: 2.6 mg/dL — ABNORMAL HIGH (ref 1.6–2.3)

## 2022-04-30 NOTE — Telephone Encounter (Signed)
   Name: Holly Benson  DOB: [birth year]  MRN: [id number]   Primary Cardiologist: Pixie Casino, MD  Chart reviewed as part of pre-operative protocol coverage. We have been asked for guidance to hold eliquis for colonoscopy.   Per our pharmD: Per office protocol, patient can hold Eliquis for 2 days prior to procedure.   Patient will not need bridging with Lovenox (enoxaparin) around procedure.  I will route this recommendation to the requesting party via Epic fax function and remove from pre-op pool. Please call with questions.  Tami Lin Siyana Erney, PA 04/30/2022, 3:29 PM

## 2022-04-30 NOTE — Telephone Encounter (Signed)
Patient with diagnosis of atrial fibrillation on Eliquis for anticoagulation.    Procedure: colonoscopy Date of procedure: 02/26/22   CHA2DS2-VASc Score = 5   This indicates a 7.2% annual risk of stroke. The patient's score is based upon: CHF History: 0 HTN History: 1 Diabetes History: 0 Stroke History: 0 Vascular Disease History: 1 Age Score: 2 Gender Score: 1   CrCl 44 Platelet count 336  Per office protocol, patient can hold Eliquis for 2 days prior to procedure.   Patient will not need bridging with Lovenox (enoxaparin) around procedure.  **This guidance is not considered finalized until pre-operative APP has relayed final recommendations.**

## 2022-05-01 NOTE — Telephone Encounter (Signed)
Patient notified that she can stop her Eliquis 2 days prior to procedure.

## 2022-05-06 DIAGNOSIS — I10 Essential (primary) hypertension: Secondary | ICD-10-CM | POA: Diagnosis not present

## 2022-05-06 DIAGNOSIS — L03116 Cellulitis of left lower limb: Secondary | ICD-10-CM | POA: Diagnosis not present

## 2022-05-06 DIAGNOSIS — W57XXXA Bitten or stung by nonvenomous insect and other nonvenomous arthropods, initial encounter: Secondary | ICD-10-CM | POA: Diagnosis not present

## 2022-05-19 ENCOUNTER — Ambulatory Visit: Payer: Medicare Other | Admitting: Family

## 2022-05-26 ENCOUNTER — Ambulatory Visit (INDEPENDENT_AMBULATORY_CARE_PROVIDER_SITE_OTHER): Payer: Medicare Other | Admitting: Family

## 2022-05-26 ENCOUNTER — Encounter: Payer: Self-pay | Admitting: Family

## 2022-05-26 VITALS — BP 138/70 | HR 83 | Temp 96.6°F | Resp 16 | Ht 62.0 in | Wt 130.8 lb

## 2022-05-26 DIAGNOSIS — I480 Paroxysmal atrial fibrillation: Secondary | ICD-10-CM

## 2022-05-26 DIAGNOSIS — G2581 Restless legs syndrome: Secondary | ICD-10-CM

## 2022-05-26 DIAGNOSIS — E782 Mixed hyperlipidemia: Secondary | ICD-10-CM

## 2022-05-26 DIAGNOSIS — I10 Essential (primary) hypertension: Secondary | ICD-10-CM | POA: Diagnosis not present

## 2022-05-26 DIAGNOSIS — Z7689 Persons encountering health services in other specified circumstances: Secondary | ICD-10-CM

## 2022-05-26 DIAGNOSIS — J449 Chronic obstructive pulmonary disease, unspecified: Secondary | ICD-10-CM

## 2022-05-26 NOTE — Progress Notes (Signed)
Provider: Marlowe Sax FNP-C   Dex Blakely, Nelda Bucks, NP  Patient Care Team: Dezarae Mcclaran, Nelda Bucks, NP as PCP - General (Family Medicine) Pixie Casino, MD as PCP - Cardiology (Cardiology) Constance Haw, MD as PCP - Electrophysiology (Cardiology)  Extended Emergency Contact Information Primary Emergency Contact: Signa Kell [place] Phone: [phone] Relation: Daughter  Code Status:  Full Code  Goals of care: Advanced Directive information    05/26/2022   10:03 AM  Advanced Directives  Does Patient Have a Medical Advance Directive? Yes  Type of Paramedic of [place];Living will  Does patient want to make changes to medical advance directive? No - Patient declined  Copy of [place] in Chart? No - copy requested     Chief Complaint  Patient presents with   Establish Care    New Patient.     HPI:  Pt is a 88 y.o. female seen today establish care here at [facility] for medical management of chronic diseases.  Has a medical history of arterial fibrillation, hypertension restless leg syndrome, obstructive sleep apnea pulmonary nodule among other conditions.  Hypertension - she does not check blood pressures at home. denies any headache,dizziness,vision changes,fatigue,chest tightness,palpitation,chest pain or shortness of breath.    Obstructive sleep Apnea - uses C-PAP machine.   RLS -uses Requip and Klonopin which has been effective.  Pulmonary nodule -that was noted on CT scan.Thought possible, caused by being on amiodarone.  Hernia-states status post ED visit 2 days ago due to painful hernia confirmed with an MRI.  States she is scheduled for upcoming colonoscopy.   Past Medical History:  Diagnosis Date   Arrhythmia    Arthritis    Atrial fibrillation (HCC)    Constipation    chronic   COPD (chronic obstructive pulmonary disease) (HCC)    Hyperlipidemia    Hypertension     pulmonary   OSA on CPAP    Osteoarthritis    Osteoporosis    RLS (restless legs syndrome)    Sleep apnea    Past Surgical History:  Procedure Laterality Date   ABDOMINAL AORTOGRAM W/LOWER EXTREMITY N/A 03/21/2020   Procedure: ABDOMINAL AORTOGRAM W/ Bilateral LOWER EXTREMITY [place];  Surgeon: Wellington Hampshire, MD;  Location: [facility];  Service: Cardiovascular;  Laterality: N/A;   ABDOMINAL HYSTERECTOMY     arm surgery Right    Broken arm and has a plate in it   CATARACT EXTRACTION Bilateral    COLONOSCOPY     More than 10 years ago In [place] ARTHROSCOPY Right    PERIPHERAL VASCULAR INTERVENTION Bilateral 03/21/2020   Procedure: PERIPHERAL VASCULAR INTERVENTION;  Surgeon: Wellington Hampshire, MD;  Location: [facility];  Service: Cardiovascular;  Laterality: Bilateral;  external iliac    Allergies  Allergen Reactions   Lovenox [Enoxaparin Sodium] Hives and Rash    PT STATES SHE BROKE OUT IN A RASH HEAD TO TOE AND LASTED ABOUT 3 WEEKS    Metoprolol Succinate [Metoprolol] Other (See Comments)    Muscle aches, hand pain, and tingling    Allergies as of 05/26/2022       Reactions   Lovenox [enoxaparin Sodium] Hives, Rash   PT STATES SHE BROKE OUT IN A RASH HEAD TO TOE AND LASTED ABOUT 3 WEEKS    Metoprolol Succinate [metoprolol] Other (See Comments)   Muscle aches, hand pain, and tingling  Medication List        Accurate as of May 26, 2022 10:12 AM. If you have any questions, ask your nurse or doctor.          STOP taking these medications    cetirizine 10 MG tablet Commonly known as: ZYRTEC Stopped by: Sandrea Hughs, NP   THERATEARS OP Stopped by: Sandrea Hughs, NP       TAKE these medications    acetaminophen 325 MG tablet Commonly known as: TYLENOL Take 650 mg by mouth as needed for moderate pain or headache.   albuterol 108 (90 Base) MCG/ACT inhaler Commonly known as: VENTOLIN HFA Inhale 2 puffs into the  lungs every 6 (six) hours as needed for wheezing or shortness of breath.   apixaban 5 MG Tabs tablet Commonly known as: Eliquis Take 1 tablet (5 mg total) by mouth 2 (two) times daily.   clonazePAM 0.5 MG tablet Commonly known as: KLONOPIN Take 0.25 mg by mouth at bedtime as needed for anxiety. What changed: Another medication with the same name was removed. Continue taking this medication, and follow the directions you see here. Changed by: Sandrea Hughs, NP   Colon Cleanse Caps Take 4 capsules by mouth as needed (constipation).   diltiazem 60 MG 12 hr capsule Commonly known as: CARDIZEM SR TAKE ONE CAPSULE BY MOUTH TWICE A DAY   docusate sodium 100 MG capsule Commonly known as: COLACE Take 300 mg by mouth at bedtime.   dofetilide 250 MCG capsule Commonly known as: TIKOSYN Take 1 capsule (250 mcg total) by mouth 2 (two) times daily.   furosemide 20 MG tablet Commonly known as: LASIX Take one tablet by mouth on Monday, Wednesday and Friday   irbesartan 75 MG tablet Commonly known as: AVAPRO Take 1 tablet (75 mg total) by mouth daily.   Magnesium 100 MG Caps Take 4 capsules by mouth daily.   OVER THE COUNTER MEDICATION Take 3 capsules by mouth daily. [place], Bone Strength   polyethylene glycol 17 g packet Commonly known as: MIRALAX / GLYCOLAX Take 17 g by mouth 2 (two) times daily.   PRESCRIPTION MEDICATION at bedtime. CPAP   PreserVision AREDS Tabs Take 1 tablet by mouth in the morning and at bedtime.   rOPINIRole 0.25 MG tablet Commonly known as: Requip Take 1 tablet in the morning and three tablets after dinner po   Stiolto Respimat 2.5-2.5 MCG/ACT Aers Generic drug: Tiotropium Bromide-Olodaterol USE 2 INHALATIONS ORALLY   EVERY MORNING   Systane 0.4-0.3 % Soln Generic drug: Polyethyl Glycol-Propyl Glycol Place 1 drop into both eyes as needed.   VITAMIN K2-VITAMIN D3 PO Take 1 Capful by mouth daily.        Review of Systems   Constitutional:  Negative for appetite change, chills, fatigue, fever and unexpected weight change.  HENT:  Positive for hearing loss. Negative for congestion, dental problem, ear discharge, ear pain, facial swelling, nosebleeds, postnasal drip, rhinorrhea, sinus pressure, sinus pain, sneezing, sore throat, tinnitus and trouble swallowing.        Wears hearing aids   Eyes:  Positive for visual disturbance. Negative for pain, discharge, redness and itching.       Follws up with ophthalmology for macular degeneration.wears eye glasses   Respiratory:  Negative for cough, chest tightness, shortness of breath and wheezing.   Cardiovascular:  Negative for chest pain and leg swelling.       Afib sees Cardiology   Gastrointestinal:  Negative for abdominal distention,  abdominal pain, blood in stool, diarrhea, nausea and vomiting.       Has upcoming appointment with GI for colonoscopy and Abd hernia  On laxative for constipation   Endocrine: Negative for cold intolerance, heat intolerance, polydipsia, polyphagia and polyuria.  Genitourinary:  Negative for difficulty urinating, dysuria, flank pain, frequency and urgency.  Musculoskeletal:  Negative for arthralgias, back pain, gait problem, joint swelling, myalgias, neck pain and neck stiffness.  Skin:  Negative for color change, pallor, rash and wound.  Neurological:  Positive for numbness. Negative for dizziness, syncope, speech difficulty, weakness, light-headedness and headaches.       Numbness on fingers  RLS follows up with Neurology   Hematological:  Does not bruise/bleed easily.  Psychiatric/Behavioral:  Negative for agitation, behavioral problems, confusion, hallucinations, self-injury, sleep disturbance and suicidal ideas. The patient is not nervous/anxious.     Immunization History  Administered Date(s) Administered   Influenza, High Dose Seasonal PF 08/03/2017, 07/15/2018, 06/28/2019, 08/03/2020   Influenza,inj,Quad PF,6+ Mos 08/02/2018    Influenza-Unspecified 08/27/2021   PFIZER(Purple Top)SARS-COV-2 Vaccination 12/08/2019, 01/02/2020, 10/02/2020, 03/04/2021   Pneumococcal Polysaccharide-23 01/27/2020   Zoster Recombinat (Shingrix) 12/25/2017, 03/04/2021   Pertinent  Health Maintenance Due  Topic Date Due   INFLUENZA VACCINE  06/03/2022   DEXA SCAN  Completed      02/20/2022    8:00 AM 02/20/2022    8:00 PM 02/21/2022    7:10 AM 04/26/2022    4:49 PM 05/26/2022   10:03 AM  Fall Risk  Falls in the past year?     0  Was there an injury with Fall?     0  Fall Risk Category Calculator     0  Fall Risk Category     Low  Patient Fall Risk Level Low fall risk Low fall risk Low fall risk Low fall risk Low fall risk  Patient at Risk for Falls Due to     No Fall Risks  Fall risk Follow up     Falls evaluation completed   Functional Status Survey:    Vitals:   05/26/22 0947  BP: 138/70  Pulse: 83  Resp: 16  Temp: (!) 96.6 F (35.9 C)  SpO2: 95%  Weight: 130 lb 12.8 oz (59.3 kg)  Height: '5\' 2"'  (1.575 m)   Body mass index is 23.92 kg/m. Physical Exam Vitals reviewed.  Constitutional:      General: She is not in acute distress.    Appearance: Normal appearance. She is normal weight. She is not ill-appearing or diaphoretic.  HENT:     Head: Normocephalic.     Right Ear: Tympanic membrane, ear canal and external ear normal. There is no impacted cerumen.     Left Ear: Tympanic membrane, ear canal and external ear normal. There is no impacted cerumen.     Nose: Nose normal. No congestion or rhinorrhea.     Mouth/Throat:     Mouth: Mucous membranes are moist.     Pharynx: Oropharynx is clear. No oropharyngeal exudate or posterior oropharyngeal erythema.  Eyes:     General: No scleral icterus.       Right eye: No discharge.        Left eye: No discharge.     Extraocular Movements: Extraocular movements intact.     Conjunctiva/sclera: Conjunctivae normal.     Pupils: Pupils are equal, round, and reactive to light.   Neck:     Vascular: No carotid bruit.  Cardiovascular:     Rate and  Rhythm: Normal rate and regular rhythm.     Pulses: Normal pulses.     Heart sounds: Normal heart sounds. No murmur heard.    No friction rub. No gallop.  Pulmonary:     Effort: Pulmonary effort is normal. No respiratory distress.     Breath sounds: Normal breath sounds. No wheezing, rhonchi or rales.  Chest:     Chest wall: No tenderness.  Abdominal:     General: Bowel sounds are normal. There is no distension.     Palpations: Abdomen is soft. There is no mass.     Tenderness: There is no abdominal tenderness. There is no right CVA tenderness, left CVA tenderness, guarding or rebound.     Hernia: A hernia is present.  Musculoskeletal:        General: No swelling or tenderness. Normal range of motion.     Cervical back: Normal range of motion. No rigidity or tenderness.     Right lower leg: No edema.     Left lower leg: No edema.  Lymphadenopathy:     Cervical: No cervical adenopathy.  Skin:    General: Skin is warm and dry.     Coloration: Skin is not pale.     Findings: No bruising, erythema, lesion or rash.  Neurological:     Mental Status: She is alert and oriented to person, place, and time.     Cranial Nerves: No cranial nerve deficit.     Sensory: No sensory deficit.     Motor: No weakness.     Coordination: Coordination normal.     Gait: Gait normal.  Psychiatric:        Mood and Affect: Mood normal.        Speech: Speech normal.        Behavior: Behavior normal.        Thought Content: Thought content normal.        Judgment: Judgment normal.    Labs reviewed: Recent Labs    02/28/22 1008 04/01/22 1205 04/26/22 1743 04/29/22 1514  NA 137 138 134*  --   K 4.6 4.7 3.6 4.6  CL 101 100 99  --   CO2 '29 25 22  ' --   GLUCOSE 90 98 116*  --   BUN '20 19 10  ' --   CREATININE 0.73 0.91 0.73  --   CALCIUM 9.5 10.0 9.6  --   MG 2.3 2.2  --  2.6*   Recent Labs    09/02/21 1229 04/26/22 1743   AST 16 21  ALT 14 19  ALKPHOS 76 112  BILITOT 0.5 0.8  PROT 7.1 7.5  ALBUMIN 4.0 4.7   Recent Labs    10/08/21 1206 03/10/22 1539 04/26/22 1743  WBC 6.7 6.6 11.5*  NEUTROABS  --  3.8 8.9*  HGB 13.2 14.0 16.1*  HCT 39.1 41.7 47.1*  MCV 98* 99.2 95.9  PLT 288 318.0 336   Lab Results  Component Value Date   TSH 2.770 08/28/2020   No results found for: "HGBA1C" Lab Results  Component Value Date   CHOL 149 04/11/2020   HDL 69 04/11/2020   LDLCALC 68 04/11/2020   TRIG 60 04/11/2020   CHOLHDL 2.2 04/11/2020    Significant Diagnostic Results in last 30 days:  CT ABDOMEN PELVIS W CONTRAST  Result Date: 04/26/2022 CLINICAL DATA:  Generalized abdominal pain EXAM: CT ABDOMEN AND PELVIS WITH CONTRAST TECHNIQUE: Multidetector CT imaging of the abdomen and pelvis was performed using the standard  protocol following bolus administration of intravenous contrast. RADIATION DOSE REDUCTION: This exam was performed according to the departmental dose-optimization program which includes automated exposure control, adjustment of the mA and/or kV according to patient size and/or use of iterative reconstruction technique. CONTRAST:  177m OMNIPAQUE IOHEXOL 300 MG/ML SOLN there was an initial 40 cc infiltrate noted. New IV was started and contrast was readministered. COMPARISON:  PET-CT from 03/20/2022 FINDINGS: Lower chest: No acute abnormality. Hepatobiliary: No focal liver abnormality is seen. No gallstones, gallbladder wall thickening, or biliary dilatation. Pancreas: Unremarkable. No pancreatic ductal dilatation or surrounding inflammatory changes. Spleen: Normal in size without focal abnormality. Adrenals/Urinary Tract: Adrenal glands are within normal limits. The kidneys demonstrate a normal enhancement pattern bilaterally with the exception of a few small cysts in the left kidney. These appear simple in nature no further follow-up is recommended. No obstructive changes are seen. The bladder is well  distended with opacified contrast. Stomach/Bowel: Distal colon is decompressed. Scattered diverticular change is noted. Some fluid is noted in the sigmoid colon and descending colon as well as the distal aspect of the transverse colon. Mild fecal material is noted in the distal transverse colon as well. In the mid transverse colon on coronal image number 69 of series 5 there is some mild wall thickening and irregularity with more proximal colonic dilatation identified. These findings are suspicious for a focal mass lesion and narrowing. The appendix is not well visualized. No inflammatory changes to suggest appendicitis are seen. Small bowel is within normal limits. Stomach is unremarkable. Vascular/Lymphatic: Aortic atherosclerosis. No enlarged abdominal or pelvic lymph nodes. Reproductive: Status post hysterectomy. No adnexal masses. Other: No free fluid is noted. There is a fat containing hernia to the right of the midline similar to that seen on the prior exam although no bowel is noted within. Musculoskeletal: Degenerative changes of the lumbar spine are noted. No acute bony abnormality is seen. Mild chronic compression deformity of L1 is seen. IMPRESSION: Proximal colonic dilatation with findings suspicious for mild mid transverse colonic apple-core lesion. Direct visualization would be helpful. A definitive caliber change in this region is noted best seen on the coronal images. No other focal abnormality is noted. Electronically Signed   By: MInez CatalinaM.D.   On: 04/26/2022 19:56    Assessment/Plan  1. Encounter to establish care Available records reviewed recommended to schedule for fasting blood work  2. RLS (restless legs syndrome) Klonopin and Requip effective  3. PAF (paroxysmal atrial fibrillation) (HCC) Heart rate controlled -Continue on Eliquis, dofetilide and diltiazem  4. Essential hypertension Blood pressure well controlled -Continue on irbesartan, diltiazem and on furosemide -  TSH - COMPLETE METABOLIC PANEL WITH GFR - CBC with Differential/Platelet  5. Mixed hyperlipidemia No latest LDL for review -Continue dietary modification and exercise as tolerated - Lipid panel  6. 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

## 2022-05-27 ENCOUNTER — Encounter: Payer: Self-pay | Admitting: Gastroenterology

## 2022-05-27 ENCOUNTER — Ambulatory Visit (INDEPENDENT_AMBULATORY_CARE_PROVIDER_SITE_OTHER): Payer: Medicare Other | Admitting: Gastroenterology

## 2022-05-27 VITALS — BP 122/66 | HR 78 | Ht 62.0 in | Wt 128.8 lb

## 2022-05-27 DIAGNOSIS — K5904 Chronic idiopathic constipation: Secondary | ICD-10-CM | POA: Diagnosis not present

## 2022-05-27 DIAGNOSIS — R933 Abnormal findings on diagnostic imaging of other parts of digestive tract: Secondary | ICD-10-CM

## 2022-05-27 DIAGNOSIS — R140 Abdominal distension (gaseous): Secondary | ICD-10-CM

## 2022-05-27 DIAGNOSIS — K436 Other and unspecified ventral hernia with obstruction, without gangrene: Secondary | ICD-10-CM

## 2022-05-27 LAB — COMPLETE METABOLIC PANEL WITH GFR
AG Ratio: 1.8 (calc) (ref 1.0–2.5)
ALT: 17 U/L (ref 6–29)
AST: 20 U/L (ref 10–35)
Albumin: 4.4 g/dL (ref 3.6–5.1)
Alkaline phosphatase (APISO): 127 U/L (ref 37–153)
BUN: 13 mg/dL (ref 7–25)
CO2: 23 mmol/L (ref 20–32)
Calcium: 9.8 mg/dL (ref 8.6–10.4)
Chloride: 100 mmol/L (ref 98–110)
Creat: 0.81 mg/dL (ref 0.60–0.95)
Globulin: 2.5 g/dL (calc) (ref 1.9–3.7)
Glucose, Bld: 93 mg/dL (ref 65–99)
Potassium: 4 mmol/L (ref 3.5–5.3)
Sodium: 138 mmol/L (ref 135–146)
Total Bilirubin: 0.7 mg/dL (ref 0.2–1.2)
Total Protein: 6.9 g/dL (ref 6.1–8.1)
eGFR: 71 mL/min/{1.73_m2} (ref >=60)

## 2022-05-27 LAB — CBC WITH DIFFERENTIAL/PLATELET
Absolute Monocytes: 750 cells/uL (ref 200–950)
Basophils Absolute: 30 cells/uL (ref 0–200)
Basophils Relative: 0.4 %
Eosinophils Absolute: 30 cells/uL (ref 15–500)
Eosinophils Relative: 0.4 %
HCT: 44.9 % (ref 35.0–45.0)
Hemoglobin: 15.6 g/dL — ABNORMAL HIGH (ref 11.7–15.5)
Lymphs Abs: 1193 cells/uL (ref 850–3900)
MCH: 33.9 pg — ABNORMAL HIGH (ref 27.0–33.0)
MCHC: 34.7 g/dL (ref 32.0–36.0)
MCV: 97.6 fL (ref 80.0–100.0)
MPV: 9.1 fL (ref 7.5–12.5)
Monocytes Relative: 10 %
Neutro Abs: 5498 cells/uL (ref 1500–7800)
Neutrophils Relative %: 73.3 %
Platelets: 309 10*3/uL (ref 140–400)
RBC: 4.6 10*6/uL (ref 3.80–5.10)
RDW: 13.2 % (ref 11.0–15.0)
Total Lymphocyte: 15.9 %
WBC: 7.5 10*3/uL (ref 3.8–10.8)

## 2022-05-27 LAB — LIPID PANEL
Cholesterol: 231 mg/dL — ABNORMAL HIGH (ref <200)
HDL: 86 mg/dL (ref >=50)
LDL Cholesterol (Calc): 125 mg/dL (calc) — ABNORMAL HIGH
Non-HDL Cholesterol (Calc): 145 mg/dL (calc) — ABNORMAL HIGH (ref <130)
Total CHOL/HDL Ratio: 2.7 (calc) (ref <5.0)
Triglycerides: 98 mg/dL (ref <150)

## 2022-05-27 LAB — TSH: TSH: 2.09 mIU/L (ref 0.40–4.50)

## 2022-05-27 NOTE — Progress Notes (Signed)
Holly Benson    [id number]    1933/10/24  Primary Care Physician:Ngetich, Nelda Bucks, NP  Referring Physician: Michael Boston, MD [address] [place],  [place] [address]   Chief complaint:  Abnormal CT  HPI: 88 year old very pleasant female with multiple comorbidities including hypertension, proximal A. fib, COPD, cardiomyopathy here for follow-up for chronic idiopathic constipation and large ventral hernia.  He is having more and more difficulty reducing bowel and the ventral hernia, she is having a large air pocket associated with significant discomfort.  She went to ER in June when she was unable to reduce it for almost 3 days, CT showed thickening of transverse colon that was in the hernia sac, ?  Apple core lesion per radiologist but recent PET/CT a month ago did not show any hypermetabolic lesion in the colon.   She is taking MiraLAX twice daily along with 2 stool softeners at bedtime.  She is having bowel movement on average every other day.  She recently also started using flaxseed for high cholesterol  She is having worsening abdominal bloating and discomfort especially when she is unable to reduce the hernia     No family history of colon cancer, IBD or celiac disease.  CT abd & pelvis 04/26/22 Proximal colonic dilatation with findings suspicious for mild mid transverse colonic apple-core lesion. Direct visualization would be helpful. A definitive caliber change in this region is noted best seen on the coronal images.  No other focal abnormality is noted.  PET CT 03/20/2022 There is no hypermetabolic activity within the liver, adrenal glands, spleen or pancreas. There is no hypermetabolic nodal activity.   Incidental CT findings: Large ventral hernia contains a portion of the transverse colon. No evidence of incarceration or obstruction. There is moderate stool throughout the colon. Diffuse aortic and branch vessel atherosclerosis status post bilateral  external iliac stenting.  IMPRESSION: 1. The enlarging nodule of concern posteriorly in the right upper lobe is hypermetabolic, consistent with bronchogenic carcinoma. No evidence of thoracic nodal or distant metastatic disease. By this examination, findings are consistent with stage IA disease (T1b N0 M0). 2. Ventral abdominal wall hernia containing transverse colon. No evidence of incarceration or bowel obstruction. 3. Coronary and aortic atherosclerosis (ICD10-I70.0). Emphysema (ICD10-J43.9).     CT chest high-resolution January 15, 2021 1. Multiple irregular masslike and nodular foci of consolidation in the peripheral peribronchovascular lungs bilaterally, predominantly in the mid to lower lungs, with associated mild-to-moderate bronchiectasis and patchy tree-in-bud opacities.   CT abdomen pelvis with contrast January 04, 2021 1. Moderate to large upper right-sided anterior abdominal wall hernia containing a portion of the transverse colon but no findings for incarceration. 2. Patchy multifocal airspace opacities at both lung bases likely an inflammatory or infectious process. Recommend correlation with patient's clinical symptoms. 3. Underlying emphysematous changes and pulmonary scarring. 4. Advanced atherosclerotic calcifications involving the aorta and iliac arteries.     Outpatient Encounter Medications as of 05/27/2022  Medication Sig   acetaminophen (TYLENOL) 325 MG tablet Take 650 mg by mouth as needed for moderate pain or headache.   albuterol (VENTOLIN HFA) 108 (90 Base) MCG/ACT inhaler Inhale 2 puffs into the lungs every 6 (six) hours as needed for wheezing or shortness of breath.   apixaban (ELIQUIS) 5 MG TABS tablet Take 1 tablet (5 mg total) by mouth 2 (two) times daily.   clonazePAM (KLONOPIN) 0.5 MG tablet Take 0.25 mg by mouth at bedtime as needed  for anxiety.   diltiazem (CARDIZEM SR) 60 MG 12 hr capsule TAKE ONE CAPSULE BY MOUTH TWICE A DAY   docusate sodium  (COLACE) 100 MG capsule Take 300 mg by mouth at bedtime.   dofetilide (TIKOSYN) 250 MCG capsule Take 1 capsule (250 mcg total) by mouth 2 (two) times daily.   furosemide (LASIX) 20 MG tablet Take one tablet by mouth on Monday, Wednesday and Friday   irbesartan (AVAPRO) 75 MG tablet Take 1 tablet (75 mg total) by mouth daily.   Magnesium 100 MG CAPS Take 4 capsules by mouth daily.   Misc Natural Products (COLON CLEANSE) CAPS Take 4 capsules by mouth as needed (constipation).   Multiple Vitamins-Minerals (PRESERVISION AREDS) TABS Take 1 tablet by mouth in the morning and at bedtime.   OVER THE COUNTER MEDICATION Take 3 capsules by mouth daily. [place], Bone Strength   Polyethyl Glycol-Propyl Glycol (SYSTANE) 0.4-0.3 % SOLN Place 1 drop into both eyes as needed.   polyethylene glycol (MIRALAX / GLYCOLAX) 17 g packet Take 17 g by mouth 2 (two) times daily.   PRESCRIPTION MEDICATION at bedtime. CPAP   rOPINIRole (REQUIP) 0.25 MG tablet Take 1 tablet in the morning and three tablets after dinner po   STIOLTO RESPIMAT 2.5-2.5 MCG/ACT AERS USE 2 INHALATIONS ORALLY   EVERY MORNING   Vitamin D-Vitamin K (VITAMIN K2-VITAMIN D3 PO) Take 1 Capful by mouth daily.   Facility-Administered Encounter Medications as of 05/27/2022  Medication   technetium tetrofosmin (TC-MYOVIEW) injection 09.9 millicurie    Allergies as of 05/27/2022 - Review Complete 05/27/2022  Allergen Reaction Noted   Lovenox [enoxaparin sodium] Hives and Rash 08/02/2019   Metoprolol succinate [metoprolol] Other (See Comments) 12/26/2016    Past Medical History:  Diagnosis Date   Arrhythmia    Arthritis    Atrial fibrillation (HCC)    Constipation    chronic   COPD (chronic obstructive pulmonary disease) (HCC)    Hyperlipidemia    Hypertension    pulmonary   OSA on CPAP    Osteoarthritis    Osteoporosis    RLS (restless legs syndrome)    Sleep apnea     Past Surgical History:  Procedure Laterality Date   ABDOMINAL  AORTOGRAM W/LOWER EXTREMITY N/A 03/21/2020   Procedure: ABDOMINAL AORTOGRAM W/ Bilateral LOWER EXTREMITY [place];  Surgeon: Wellington Hampshire, MD;  Location: [facility];  Service: Cardiovascular;  Laterality: N/A;   ABDOMINAL HYSTERECTOMY     arm surgery Right    Broken arm and has a plate in it   CATARACT EXTRACTION Bilateral    COLONOSCOPY     More than 10 years ago In [place] ARTHROSCOPY Right    PERIPHERAL VASCULAR INTERVENTION Bilateral 03/21/2020   Procedure: PERIPHERAL VASCULAR INTERVENTION;  Surgeon: Wellington Hampshire, MD;  Location: [facility];  Service: Cardiovascular;  Laterality: Bilateral;  external iliac    Family History  Problem Relation Age of Onset   Suicidality Father    Stroke Maternal Grandfather    Hypertension Sister    Colon cancer Neg Hx    Esophageal cancer Neg Hx    Pancreatic cancer Neg Hx    Stomach cancer Neg Hx    Liver disease Neg Hx     Social History   Socioeconomic History   Marital status: Widowed    Spouse name: Not on file   Number of children: 3   Years of education: Not on file   Highest  education level: Not on file  Occupational History   Occupation: retired    Comment: retired Licensed conveyancer  Tobacco Use   Smoking status: Former    Packs/day: 1.50    Years: 30.00    Total pack years: 45.00    Types: Cigarettes    Start date: 50    Quit date: 11/04/1991    Years since quitting: 30.5   Smokeless tobacco: Never   Tobacco comments:    Former smoker 03/14/22  Vaping Use   Vaping Use: Never used  Substance and Sexual Activity   Alcohol use: Yes    Alcohol/week: 7.0 standard drinks of alcohol    Types: 7 Glasses of wine per week    Comment: 1 glass of wine nightly 03/14/22   Drug use: No   Sexual activity: Not Currently  Other Topics Concern   Not on file  Social History Narrative   Lives with daughter   Social Determinants of Health   Financial Resource Strain: Not on file  Food Insecurity: Not  on file  Transportation Needs: Not on file  Physical Activity: Not on file  Stress: Not on file  Social Connections: Not on file  Intimate Partner Violence: Not on file      Review of systems: All other review of systems negative except as mentioned in the HPI.   Physical Exam: Vitals:   05/27/22 0839  BP: 122/66  Pulse: 78  SpO2: 95%   Body mass index is 23.56 kg/m. Gen:      No acute distress HEENT:  sclera anicteric Abd:      Large ventral hernia, I was able to reduce after significant maneuvering, soft, mild tenderness during reduction of hernia , no palpable masses, no rebound Ext:    No edema Neuro: alert and oriented x 3 Psych: normal mood and affect  Data Reviewed:  Reviewed labs, radiology imaging, old records and pertinent past GI work up   Assessment and Plan/Recommendations:  88 year old very pleasant female with history of hypertension, COPD, sleep apnea, cardiomyopathy, proximal A. fib, large ventral hernia with chronic idiopathic constipation    Large ventral hernia involving the transverse colon: Transverse colon thickening noted on CT in June after patient had obstructed hernia for 3 days, ?  Apple core lesion in transverse is probably misread as recent PET/CT in May 2023 was negative for any hypermetabolic lesion in the abdomen. We will request repeat CT abdomen pelvis with contrast/virtual CT colonography  We will hold off colonoscopy, is at risk for bowel perforation with hernia involving the transverse colon, may also not be able to complete the exam based on anatomy  Refer to Dr. Alvan Dame at [facility] for evaluation of large ventral hernia repair as she is having more difficulty reducing the hernia and also having episodes of partial bowel obstruction  Chronic idiopathic constipation Continue MiraLAX 1 capful twice daily  Use Benefiber 1 tablespoon twice daily with meals.  Advised patient to avoid flaxseeds Use stool softener daily at  bedtime Continue 8 cups of water daily      Return in 3 months     This visit required >40 minutes of patient care (this includes precharting, chart review, review of results, face-to-face time used for counseling as well as treatment plan and follow-up. The patient was provided an opportunity to ask questions and all were answered. The patient agreed with the plan and demonstrated an understanding of the instructions.  Damaris Hippo , MD    CC:  Michael Boston, MD

## 2022-05-27 NOTE — Patient Instructions (Addendum)
We will refer you to [facility] surgical center. They will contact you with an appt date and time for a consult.  [facility] will contact you to schedule your Virtual CT colon.  Continue Miralax 1 capful twice daily.   Take over the counter Benefiber 1 tablespoon twice daily.  Avoid flax seed.   Please follow up with Dr. Silverio Decamp in 3 months.

## 2022-05-28 ENCOUNTER — Encounter: Payer: Medicare Other | Admitting: Gastroenterology

## 2022-05-29 ENCOUNTER — Encounter: Payer: Self-pay | Admitting: Gastroenterology

## 2022-05-29 ENCOUNTER — Other Ambulatory Visit: Payer: Self-pay | Admitting: Emergency Medicine

## 2022-06-03 ENCOUNTER — Encounter: Payer: Self-pay | Admitting: Gastroenterology

## 2022-06-09 ENCOUNTER — Telehealth: Payer: Self-pay | Admitting: Gastroenterology

## 2022-06-09 NOTE — Telephone Encounter (Signed)
Shelli from [facility] called requesting the patients demographics and insurance info to go along with the referral sent. Fax: [phone]

## 2022-06-09 NOTE — Telephone Encounter (Signed)
Requested information faxed to [facility] at [facility]

## 2022-06-20 ENCOUNTER — Telehealth: Payer: Self-pay | Admitting: Emergency Medicine

## 2022-06-20 NOTE — Telephone Encounter (Signed)
CT report printed. And last office notes from [facility] and [employer]. This is documentation for support as to why CT was ordered. Nothing further. Faxed it insurance.

## 2022-06-23 ENCOUNTER — Encounter: Payer: Self-pay | Admitting: Neurology

## 2022-06-23 ENCOUNTER — Encounter: Payer: Self-pay | Admitting: Emergency Medicine

## 2022-06-26 NOTE — Telephone Encounter (Signed)
Dr. Ileene Musa, please help clarify pts CT chest. The order was placed at last OV on 04/08/22, in the comments of the CT scan it states to have CT done in October but in the [facility] notes it states August. Pt is currently scheduled for October to have CT. Should this be rescheduled for sooner? Thanks.

## 2022-06-27 NOTE — Telephone Encounter (Signed)
I reviewed chart. Would prefer to schedule the CT in August > can do now. Then would want to review with her in office after completed.

## 2022-06-27 NOTE — Telephone Encounter (Signed)
PCCs, is there any way you can help [place] get pt's CT moved up to now and also pt's OV with Dr. Lamonte Sakai after?

## 2022-06-30 ENCOUNTER — Telehealth: Payer: Self-pay | Admitting: Emergency Medicine

## 2022-06-30 NOTE — Telephone Encounter (Signed)
error 

## 2022-07-06 NOTE — Progress Notes (Unsigned)
Cardiology Office Note Date:  07/06/2022  Patient ID:  Holly Benson, Holly Benson 1933-03-17, MRN [id number] PCP:  Sandrea Hughs, NP  Cardiologist:  Dr. Debara Pickett Electrophysiologist: Dr. Curt Bears    Chief Complaint:  planned f/u  History of Present Illness: Jalyah Weinheimer is a 88 y.o. female with history of HTN, HLD, mixed CM (?), OSA w/CPAP, COPD, PAfib, flutter, PVD s/p b/l iliac stenting may 2021.   She come sin today to be seen for Dr. Curt Bears s/p Tikosyn load.  She arrived on SR and did not require DCCV.  Discharged 02/21/22.  She saw Afib clinic 02/28/22, not feeling any improved or different in SR/on drug. She was mildly bradycardic, noted some edema, and her dilt stopped QTc was good and continued  She was seen again in the Afib clinic with increasing fatigue/weakness.  She was in SR and had been maintaining SR by her Kardia tracings. She was still on the diltiazem, HR 50, she did not stop it (?unclear why), planned to reduce her coreg to 3.125. She was going to continue dofetilide, though if not feeling ant better at her 70momark, she would prefer to stop AAD and manage with rate control strategy.  I saw her  03/22/22 She again, does not feel like she feels well, certainly not better . She has noticed that some days when going up her stairs a task that is typically easy for her that she feels winded, she has a couple flights to get to her home and in the last month sometimes has to stop to catch her breath.  No CP. She also feels like her legs are weaker. Both are some days notable and other days none at all. She connects this to onset of Tikosyn. She has had some brief intermittent fluttering this AM, did not make any kardia tracings. She has been found to have a spot on her lung, she has been told is small but suspicious, and is going to need a bronchoscopy.  She is very reluctant to stop Tikosyn because if it is helping her stay in normal rhythm, her stroke risk therefor also  improves and very worrried about being off her Eliquis for a couple days for the bronch. No near syncope or syncope. Excellent medication compliance. EKG/QTc stable No changes were made Planned for an updated echo and a stress test to better evaluate her symptoms and upcoming procedure. Not felt to be Tikosyn related, though timing by pt she suspected drug but did not want to stop  TTE noted normal longitudinal strain, LVEF 65-70%, no WMA PVSP elevated 47.4 RA severely dilated, mild MR, mild-mod TR Stress test was good, no ischemia/infarct  I saw her 04/29/22 Generally feeling better. She and Dr. BLamonte Sakaihave decided to defer the bronch and repeat her CT in a few months She saw GI yesterday and also, planned to monitor behavior/symptoms from her large hernia, with hopes of managing conservatively She will eventually though need a colonoscopy She finds her BPs get low in the afternoons, similar to todays, no near syncope or syncope, but makes her feel weak, tired She has had a couple episodes of Afib confirmed by her Kardia device, in my reviw, would agree is Afib, rates 70's. Duration she suspects probably an hour or a couple hours With lower BPs, her coreg was stopped, planned to work with her dilt. She was happy with her burden, no other changs were made.  TODAY She is doing quite well, happy with how things are  going Reports rare palpitations/AFib episodes No CP, no SOB No near syncope or syncope. She denies any bleeding or signs of bleeding She is eating healthy and hopes to be able to avoid surgery for her hernia Weight  has stabilized ben consistently <60kg  AFib Hx Unclear when diagnosed, known diagnoses for her when she moved her from [place] in 2018 AAD hx: Flecainide started Sep 2020 > despite converting to SR she had increased SOB on drug and was stopped. Amiodarone started Oct 2020 >> stopped May 2022 seems with concers of possible pulm toxicity Tikosyn started  02/2022   Past Medical History:  Diagnosis Date   Arrhythmia    Arthritis    Atrial fibrillation (HCC)    Constipation    chronic   COPD (chronic obstructive pulmonary disease) (HCC)    Hyperlipidemia    Hypertension    pulmonary   OSA on CPAP    Osteoarthritis    Osteoporosis    RLS (restless legs syndrome)    Sleep apnea     Past Surgical History:  Procedure Laterality Date   ABDOMINAL AORTOGRAM W/LOWER EXTREMITY N/A 03/21/2020   Procedure: ABDOMINAL AORTOGRAM W/ Bilateral LOWER EXTREMITY Run[place];  Surgeon: Wellington Hampshire, MD;  Location: [facility];  Service: Cardiovascular;  Laterality: N/A;   ABDOMINAL HYSTERECTOMY     arm surgery Right    Broken arm and has a plate in it   CATARACT EXTRACTION Bilateral    COLONOSCOPY     More than 10 years ago In [place] ARTHROSCOPY Right    PERIPHERAL VASCULAR INTERVENTION Bilateral 03/21/2020   Procedure: PERIPHERAL VASCULAR INTERVENTION;  Surgeon: Wellington Hampshire, MD;  Location: [facility];  Service: Cardiovascular;  Laterality: Bilateral;  external iliac    Current Outpatient Medications  Medication Sig Dispense Refill   acetaminophen (TYLENOL) 325 MG tablet Take 650 mg by mouth as needed for moderate pain or headache.     albuterol (VENTOLIN HFA) 108 (90 Base) MCG/ACT inhaler Inhale 2 puffs into the lungs every 6 (six) hours as needed for wheezing or shortness of breath. 18 g 6   apixaban (ELIQUIS) 5 MG TABS tablet Take 1 tablet (5 mg total) by mouth 2 (two) times daily. 180 tablet 1   clonazePAM (KLONOPIN) 0.5 MG tablet Take 0.25 mg by mouth at bedtime as needed for anxiety.     diltiazem (CARDIZEM SR) 60 MG 12 hr capsule TAKE ONE CAPSULE BY MOUTH TWICE A DAY 180 capsule 3   docusate sodium (COLACE) 100 MG capsule Take 300 mg by mouth at bedtime.     dofetilide (TIKOSYN) 250 MCG capsule Take 1 capsule (250 mcg total) by mouth 2 (two) times daily. 180 capsule 1   furosemide (LASIX) 20 MG tablet Take  one tablet by mouth on Monday, Wednesday and Friday     irbesartan (AVAPRO) 75 MG tablet Take 1 tablet (75 mg total) by mouth daily. 90 tablet 3   Magnesium 100 MG CAPS Take 4 capsules by mouth daily.     Misc Natural Products (COLON CLEANSE) CAPS Take 4 capsules by mouth as needed (constipation).     Multiple Vitamins-Minerals (PRESERVISION AREDS) TABS Take 1 tablet by mouth in the morning and at bedtime.     OVER THE COUNTER MEDICATION Take 3 capsules by mouth daily. [place], Bone Strength     Polyethyl Glycol-Propyl Glycol (SYSTANE) 0.4-0.3 % SOLN Place 1 drop into both eyes as needed.  polyethylene glycol (MIRALAX / GLYCOLAX) 17 g packet Take 17 g by mouth 2 (two) times daily.     PRESCRIPTION MEDICATION at bedtime. CPAP     rOPINIRole (REQUIP) 0.25 MG tablet Take 1 tablet in the morning and three tablets after dinner po 360 tablet 3   Tiotropium Bromide-Olodaterol (STIOLTO RESPIMAT) 2.5-2.5 MCG/ACT AERS USE 2 INHALATIONS ORALLY   EVERY MORNING 12 g 3   Vitamin D-Vitamin K (VITAMIN K2-VITAMIN D3 PO) Take 1 Capful by mouth daily.     No current facility-administered medications for this visit.   Facility-Administered Medications Ordered in Other Visits  Medication Dose Route Frequency Provider Last Rate Last Admin   technetium tetrofosmin (TC-MYOVIEW) injection 03.2 millicurie  12.2 millicurie Intravenous Once PRN Skeet Latch, MD        Allergies:   Lovenox [enoxaparin sodium] and Metoprolol succinate [metoprolol]   Social History:  The patient  reports that she quit smoking about 30 years ago. Her smoking use included cigarettes. She started smoking about 67 years ago. She has a 45.00 pack-year smoking history. She has never used smokeless tobacco. She reports current alcohol use of about 7.0 standard drinks of alcohol per week. She reports that she does not use drugs.   Family History:  The patient's family history includes Hypertension in her sister; Stroke in her maternal  grandfather; Suicidality in her father.  ROS:  Please see the history of present illness.    All other systems are reviewed and otherwise negative.   PHYSICAL EXAM:  VS:  There were no vitals taken for this visit. BMI: There is no height or weight on file to calculate BMI. Well nourished, well developed, in no acute distress  HEENT: normocephalic, atraumatic  Neck: no JVD, carotid bruits or masses Cardiac:   RRR; no significant murmurs, no rubs, or gallops Lungs:   CTA b/l, no wheezing, rhonchi or rales  Abd: soft, nontender MS: arthritic deformities of her hands, age appropriate atrophy Ext:   no edema  Skin: warm and dry, no rash Neuro:  No gross deficits appreciated Psych: euthymic mood, full affect    EKG done today and reviewed by myself: SR/sinus arrhythmia,  69bpm, QTc 429m  04/17/22: TTE 1. Global longitudinal strain is -17.1% Improved from echo in Dec 2022.  Left ventricular ejection fraction, by estimation, is 65 to 70%. The left  ventricle has normal function. The left ventricle has no regional wall  motion abnormalities. Left  ventricular diastolic parameters were normal.   2. Right ventricular systolic function is mildly reduced. The right  ventricular size is normal. There is moderately elevated pulmonary artery  systolic pressure.   3. Right atrial size was severely dilated.   4. The mitral valve is normal in structure. Mild mitral valve  regurgitation.   5. Tricuspid valve regurgitation is mild to moderate.   6. The aortic valve is tricuspid. Aortic valve regurgitation is mild.  Aortic valve sclerosis is present, with no evidence of aortic valve  stenosis.    04/04/22: stress myoview   The study is normal. Findings are consistent with no prior ischemia and no prior myocardial infarction. The study is low risk.   No ST deviation was noted.   LV perfusion is normal. There is no evidence of ischemia. There is no evidence of infarction.   Left ventricular  function is normal. Nuclear stress EF: 64 %. The left ventricular ejection fraction is normal (55-65%). End diastolic cavity size is normal. End systolic cavity size is  normal.   Prior study available for comparison from 12/08/2018.   10/21/2021: TTE 1. Left ventricular ejection fraction, by estimation, is 65 to 70%. The  left ventricle has normal function. The left ventricle has no regional  wall motion abnormalities. Left ventricular diastolic function could not  be evaluated. The average left  ventricular global longitudinal strain is -10.5 %. The global longitudinal  strain is abnormal.   2. Right ventricular systolic function is moderately reduced. The right  ventricular size is moderately enlarged. There is mildly elevated  pulmonary artery systolic pressure. The estimated right ventricular  systolic pressure is 63.3 mmHg.   3. Left atrial size was severely dilated.   4. Right atrial size was severely dilated.   5. The mitral valve is normal in structure. Mild to moderate mitral valve  regurgitation. No evidence of mitral stenosis.   6. Tricuspid valve regurgitation is severe.   7. The aortic valve is normal in structure. Aortic valve regurgitation is  mild. No aortic stenosis is present.   8. The inferior vena cava is dilated in size with >50% respiratory  variability, suggesting right atrial pressure of 8 mmHg.   Comparison(s): EF 60%, RA & LA severe dilated, moderate TR.   April 2021, [facility] AT Max 96 bpm 09:03am, 03/18 Min 48 bpm 04:29am, 03/20 Avg 66 bpm Less than 1% PACs and PVCs No atrial fibrillation noted Predominant rhythm was sinus rhythm Symptoms associated with sinus rhythm   12/08/2018: stress test The left ventricular ejection fraction is hyperdynamic (>65%). Nuclear stress EF: 71%. There was downsloping of the ST segments in the inferolateral leads during infusion with no significan J point depression which rapidly resolved in early recovery. The study is  normal. This is a low risk study.   Echocardiogram 11/18/2017 Study Conclusions  - Left ventricle: The cavity size was normal. Wall thickness was    increased in a pattern of mild LVH. Systolic function was normal.    The estimated ejection fraction was in the range of 60% to 65%.    Wall motion was normal; there were no regional wall motion    abnormalities. The study is not technically sufficient to allow    evaluation of LV diastolic function.  - Mitral valve: Mildly thickened leaflets . There was trivial    regurgitation.  - Left atrium: Moderately dilated.  - Right ventricle: The cavity size was mildly dilated.  - Right atrium: Moderately dilated.  - Tricuspid valve: There was moderate regurgitation.  - Pulmonary arteries: PA peak pressure: 51 mm Hg (S).  - Inferior vena cava: The vessel was dilated. The respirophasic    diameter changes were blunted (< 50%), consistent with elevated    central venous pressure.   Cardiac event monitor 03/10/2018 Sinus Bradycardia / Sinus Rhythm / Sinus Arrhythmia/ Sinus Tachycardia, with intermittent First Degree AV Block, ? Junctional Rhythm and 2 pauses over 2 seconds - this appears to be 2nd degree type II AV block.  PVC's, and PAC's noted. Ventricular Couplets, Trigeminy. Atrial Pairs, Runs, Trigeminy. Diary entries correlate with PVC's, and PAC's. No atrial flutter was noted.  Recent Labs: 03/10/2022: Pro B Natriuretic peptide (BNP) 210.0 04/29/2022: Magnesium 2.6 05/26/2022: ALT 17; BUN 13; Creat 0.81; Hemoglobin 15.6; Platelets 309; Potassium 4.0; Sodium 138; TSH 2.09  05/26/2022: Cholesterol 231; HDL 86; LDL Cholesterol (Calc) 125; Total CHOL/HDL Ratio 2.7; Triglycerides 98   CrCl cannot be calculated (Patient's most recent lab result is older than the maximum 21 days allowed.).  Wt Readings from Last 3 Encounters:  05/27/22 128 lb 12.8 oz (58.4 kg)  05/26/22 130 lb 12.8 oz (59.3 kg)  04/29/22 130 lb 3.2 oz (59.1 kg)     Other studies  reviewed: Additional studies/records reviewed today include: summarized above  ASSESSMENT AND PLAN:  1. Paroxysmal AFib/flutter is also mentioned     CHA2DS2Vasc is 4, on Eliquis,  will reduce her dose to 2.'5mg'$  BID      Tikosyn w/ stable QTc      Labs today        2. HTN    Looks OK           3. PVD     C/w cardiology/ team, Dr. Fletcher Anon     No symptoms sounding of claudication   Disposition: will have her back in 57mo sooner if needed    Current medicines are reviewed at length with the patient today.  The patient did not have any concerns regarding medicines.  SVenetia Night PA-C 07/06/2022 2:29 PM     [place] [phone](office)  [phone])

## 2022-07-09 ENCOUNTER — Encounter: Payer: Self-pay | Admitting: Physician Assistant

## 2022-07-09 ENCOUNTER — Ambulatory Visit: Payer: Medicare Other | Attending: Physician Assistant | Admitting: Physician Assistant

## 2022-07-09 VITALS — BP 142/60 | HR 69 | Ht 62.0 in | Wt 129.8 lb

## 2022-07-09 DIAGNOSIS — I10 Essential (primary) hypertension: Secondary | ICD-10-CM | POA: Diagnosis not present

## 2022-07-09 DIAGNOSIS — Z79899 Other long term (current) drug therapy: Secondary | ICD-10-CM | POA: Diagnosis not present

## 2022-07-09 DIAGNOSIS — Z5181 Encounter for therapeutic drug level monitoring: Secondary | ICD-10-CM | POA: Insufficient documentation

## 2022-07-09 DIAGNOSIS — I480 Paroxysmal atrial fibrillation: Secondary | ICD-10-CM | POA: Diagnosis not present

## 2022-07-09 LAB — BASIC METABOLIC PANEL
BUN/Creatinine Ratio: 22 (ref 12–28)
BUN: 17 mg/dL (ref 8–27)
CO2: 25 mmol/L (ref 20–29)
Calcium: 9.6 mg/dL (ref 8.7–10.3)
Chloride: 100 mmol/L (ref 96–106)
Creatinine, Ser: 0.79 mg/dL (ref 0.57–1.00)
Glucose: 94 mg/dL (ref 70–99)
Potassium: 4.5 mmol/L (ref 3.5–5.2)
Sodium: 139 mmol/L (ref 134–144)
eGFR: 73 mL/min/{1.73_m2} (ref >59)

## 2022-07-09 LAB — MAGNESIUM: Magnesium: 2.2 mg/dL (ref 1.6–2.3)

## 2022-07-09 MED ORDER — APIXABAN 2.5 MG PO TABS: 2.5000 mg | ORAL_TABLET | Freq: Two times a day (BID) | ORAL | 2 refills | Status: DC

## 2022-07-09 NOTE — Patient Instructions (Signed)
Medication Instructions:   START TAKING: ELIQUIS 2.5 MG TWICE A DAY   STOP TAKING AND REMOVE THIS MEDICATION FROM YOUR MEDICATION LIST: ELIQUIS 5 MG    *If you need a refill on your cardiac medications before your next appointment, please call your pharmacy*   Lab Work: [facility] AND [place]    If you have labs (blood work) drawn today and your tests are completely normal, you will receive your results only by: Portal (if you have MyChart) OR A paper copy in the mail If you have any lab test that is abnormal or we need to change your treatment, we will call you to review the results.   Testing/Procedures: NONE ORDERED  TODAY    Follow-Up: At [facility], you and your health needs are our priority.  As part of our continuing mission to provide you with exceptional heart care, we have created designated Provider Care Teams.  These Care Teams include your primary Cardiologist (physician) and Advanced Practice Providers (APPs -  Physician Assistants and Nurse Practitioners) who all work together to provide you with the care you need, when you need it.  We recommend signing up for the patient portal called "MyChart".  Sign up information is provided on this After Visit Summary.  MyChart is used to connect with patients for Virtual Visits (Telemedicine).  Patients are able to view lab/test results, encounter notes, upcoming appointments, etc.  Non-urgent messages can be sent to your provider as well.   To learn more about what you can do with MyChart, go to [email or web address].    Your next appointment:   4 month(s)  The format for your next appointment:   In Person  Provider:   You may see [name], MD or one of the following Advanced Practice Providers on your designated Care Team:   Tommye Standard, [place]  Other Instructions:   Important Information About Sugar

## 2022-07-10 DIAGNOSIS — K439 Ventral hernia without obstruction or gangrene: Secondary | ICD-10-CM | POA: Diagnosis not present

## 2022-07-10 DIAGNOSIS — Z01818 Encounter for other preprocedural examination: Secondary | ICD-10-CM | POA: Diagnosis not present

## 2022-07-10 DIAGNOSIS — Z7901 Long term (current) use of anticoagulants: Secondary | ICD-10-CM | POA: Diagnosis not present

## 2022-07-12 ENCOUNTER — Ambulatory Visit (HOSPITAL_BASED_OUTPATIENT_CLINIC_OR_DEPARTMENT_OTHER)
Admission: RE | Admit: 2022-07-12 | Discharge: 2022-07-12 | Disposition: A | Discharge status: Home / Self Care | Payer: Medicare Other | Source: Ambulatory Visit | Attending: Emergency Medicine | Admitting: Emergency Medicine

## 2022-07-12 DIAGNOSIS — R911 Solitary pulmonary nodule: Secondary | ICD-10-CM | POA: Insufficient documentation

## 2022-07-12 DIAGNOSIS — J439 Emphysema, unspecified: Secondary | ICD-10-CM | POA: Diagnosis not present

## 2022-07-16 DIAGNOSIS — H02889 Meibomian gland dysfunction of unspecified eye, unspecified eyelid: Secondary | ICD-10-CM | POA: Diagnosis not present

## 2022-07-16 DIAGNOSIS — Z961 Presence of intraocular lens: Secondary | ICD-10-CM | POA: Diagnosis not present

## 2022-07-16 DIAGNOSIS — H5211 Myopia, right eye: Secondary | ICD-10-CM | POA: Diagnosis not present

## 2022-07-16 DIAGNOSIS — H353131 Nonexudative age-related macular degeneration, bilateral, early dry stage: Secondary | ICD-10-CM | POA: Diagnosis not present

## 2022-07-16 DIAGNOSIS — H5202 Hypermetropia, left eye: Secondary | ICD-10-CM | POA: Diagnosis not present

## 2022-07-16 DIAGNOSIS — H1849 Other corneal degeneration: Secondary | ICD-10-CM | POA: Diagnosis not present

## 2022-07-16 DIAGNOSIS — H52223 Regular astigmatism, bilateral: Secondary | ICD-10-CM | POA: Diagnosis not present

## 2022-07-16 DIAGNOSIS — H04123 Dry eye syndrome of bilateral lacrimal glands: Secondary | ICD-10-CM | POA: Diagnosis not present

## 2022-07-16 DIAGNOSIS — H524 Presbyopia: Secondary | ICD-10-CM | POA: Diagnosis not present

## 2022-07-17 ENCOUNTER — Encounter: Payer: Self-pay | Admitting: Internal Medicine

## 2022-07-17 ENCOUNTER — Ambulatory Visit: Payer: Medicare Other | Attending: Internal Medicine | Admitting: Internal Medicine

## 2022-07-17 VITALS — BP 130/66 | HR 50 | Ht 62.0 in | Wt 129.4 lb

## 2022-07-17 DIAGNOSIS — I480 Paroxysmal atrial fibrillation: Secondary | ICD-10-CM

## 2022-07-17 DIAGNOSIS — Z01810 Encounter for preprocedural cardiovascular examination: Secondary | ICD-10-CM | POA: Diagnosis not present

## 2022-07-17 DIAGNOSIS — I10 Essential (primary) hypertension: Secondary | ICD-10-CM | POA: Diagnosis not present

## 2022-07-17 NOTE — Progress Notes (Signed)
OFFICE NOTE  Chief Complaint:  Follow-up A-fib  Primary Care Physician: Ngetich, Nelda Bucks, NP  HPI:  Holly Benson is a 88 y.o. female with a past medial history significant for atrial fibrillation, COPD, hypertension and dyslipidemia.  She is originally from [place] but has been living in [place].  She was established and treated by Dr. Gloriann Loan, a cardiac electrophysiologist with [facility].  She underwent electrical cardioversion and was offered an A. fib ablation, but preferred medical therapy.  She has been on diltiazem and Eliquis as well as losartan for hypertension.  She also takes pravastatin and Spiriva for her COPD.  She denies any chest pain or worsening shortness of breath.  She reports she has had a stress test and an echo in the past, neither which were remarkable.  We will try to obtain those records from her prior cardiologist.  Recently she has noted some increase in palpitations and feeling like her heart is pounding.  This is mostly at night when she is trying to go to bed.  She says it can happen several times a week, but it certainly not daily.  11/10/2017  Holly Benson returns today for follow-up.  She underwent monitoring which demonstrated sinus rhythm with PACs and PVCs.  There were some periods of sinus tachycardia for which correlated with her palpitations.  No evidence of atrial fibrillation was noted.  She says she continues to have episodes of hard heartbeats and feeling like her heart is pounding.  She discussed this further with her daughter-in-law over the holidays who is a Librarian, academic.  I was provided with a letter which indicated all of her symptoms including twice a week where she wakes up in the middle night feeling suffocated.  She has had episodes of reactive hypoglycemia in the past that would cause elevated heart rate however blood sugars were normal this week.  She went to the ER on December 10 for elevated heart rate,  lightheadedness and elevated blood pressure however workup was unremarkable.  There was a concern for possible dehydration.  In the past her echo in [place] showed a EF was around 40-45% however she said that was more than 5 years ago.  LV function has not been reassessed.  01/12/2018  Holly Benson returns today for follow-up.  When I last saw her I placed her on low-dose metoprolol for additional rate control.  Today she is noted to be in atrial flutter with variable ventricular response and PVCs at 79.  When I walked in I asked her how she was feeling and she said she felt great.  She is asymptomatic with this and was not aware that she was out of rhythm.  Interestingly on her monitor she only showed some PACs and PVCs with predominantly sinus rhythm but was having symptoms of palpitations.  It does not seem that her symptoms necessarily correlate with her arrhythmias.  This seems to be the case when I reviewed her records from [place].  She had both A. fib and a flutter was initially considered for flutter ablation however with her concomitant A. fib this was not pursued.  She also has had chest discomfort symptoms and underwent exercise treadmill testing in January 2017 which was negative for ischemia.  LVEF was 40-45% in the past however based on a repeat echo we performed this past month, her LVEF is now 60-65%.  She does have biatrial enlargement.  03/25/2018  Holly Benson was seen today in follow-up.  She underwent  monitoring which showed sinus bradycardia, sinus arrhythmia, intermittent first-degree AV block and junctional rhythm with pauses greater than 2 seconds but less than 3 seconds.  There was short periods of second-degree AV block type II.  Ventricular couplets atrial pairs runs and trigeminy were all noted.  There is no significant atrial fibrillation or ventricular fibrillation.  No atrial flutter was noted.  She reports improvement in her symptoms after adjustment of her beta-blocker and calcium channel  blocker.  Overall she is pleased with her symptoms at this time.  09/22/2018  Holly Benson is seen today for routine follow-up.  She again has some complaints of fatigue.  She is concerned this may be related to her medications.  She has not noted to have any significant bradycardia today in fact heart rates in the 60s with a solitary PVC on EKG.  She reports compliance with medicines her blood pressures been generally well controlled.  There is no evidence of A. fib today.  She is had no bleeding problems on Eliquis.  She is due for a refill of that medication.  She does report fatigue and difficulty staying asleep at night.  She is not noted to snore.  She denies any morning headaches but does oftentimes have to nap during the day.  07/17/2022  Holly Benson is seen today for follow-up.  She had just seen Tommye Standard, with electrophysiology about 1 week ago.  She seems to be doing well on dofetilide.  Her EKG showed she was in rhythm without any significant QT changes.  She remains bradycardic and does get some fatigue but seems to be tolerable.  Her Eliquis dose was decreased due to age over 15 and weight less than 60 kg.  She is scheduled to have upcoming surgery at [facility] for hernia.  From a cardiac standpoint I feel she would be at acceptable risk for this.  PMHx:  Past Medical History:  Diagnosis Date   Arrhythmia    Arthritis    Atrial fibrillation (HCC)    Constipation    chronic   COPD (chronic obstructive pulmonary disease) (HCC)    Hyperlipidemia    Hypertension    pulmonary   OSA on CPAP    Osteoarthritis    Osteoporosis    RLS (restless legs syndrome)    Sleep apnea     Past Surgical History:  Procedure Laterality Date   ABDOMINAL AORTOGRAM W/LOWER EXTREMITY N/A 03/21/2020   Procedure: ABDOMINAL AORTOGRAM W/ Bilateral LOWER EXTREMITY [place];  Surgeon: Wellington Hampshire, MD;  Location: [facility];  Service: Cardiovascular;  Laterality: N/A;   ABDOMINAL HYSTERECTOMY      arm surgery Right    Broken arm and has a plate in it   CATARACT EXTRACTION Bilateral    COLONOSCOPY     More than 10 years ago [place] ARTHROSCOPY Right    PERIPHERAL VASCULAR INTERVENTION Bilateral 03/21/2020   Procedure: PERIPHERAL VASCULAR INTERVENTION;  Surgeon: Wellington Hampshire, MD;  Location: [facility];  Service: Cardiovascular;  Laterality: Bilateral;  external iliac    FAMHx:  Family History  Problem Relation Age of Onset   Suicidality Father    Stroke Maternal Grandfather    Hypertension Sister    Colon cancer Neg Hx    Esophageal cancer Neg Hx    Pancreatic cancer Neg Hx    Stomach cancer Neg Hx    Liver disease Neg Hx     SOCHx:   reports that  she quit smoking about 30 years ago. Her smoking use included cigarettes. She started smoking about 67 years ago. She has a 45.00 pack-year smoking history. She has never used smokeless tobacco. She reports current alcohol use of about 7.0 standard drinks of alcohol per week. She reports that she does not use drugs.  ALLERGIES:  Allergies  Allergen Reactions   Lovenox [Enoxaparin Sodium] Hives and Rash    PT STATES SHE BROKE OUT IN A RASH HEAD TO TOE AND LASTED ABOUT 3 WEEKS    Metoprolol Succinate [Metoprolol] Other (See Comments)    Muscle aches, hand pain, and tingling    ROS: Pertinent items noted in HPI and remainder of comprehensive ROS otherwise negative.  HOME MEDS: Current Outpatient Medications on File Prior to Visit  Medication Sig Dispense Refill   acetaminophen (TYLENOL) 325 MG tablet Take 650 mg by mouth as needed for moderate pain or headache.     albuterol (VENTOLIN HFA) 108 (90 Base) MCG/ACT inhaler Inhale 2 puffs into the lungs every 6 (six) hours as needed for wheezing or shortness of breath. 18 g 6   apixaban (ELIQUIS) 2.5 MG TABS tablet Take 1 tablet (2.5 mg total) by mouth 2 (two) times daily. 180 tablet 2   clonazePAM (KLONOPIN) 0.5 MG tablet Take 0.25 mg by mouth at  bedtime as needed for anxiety.     docusate sodium (COLACE) 100 MG capsule Take 300 mg by mouth at bedtime.     dofetilide (TIKOSYN) 250 MCG capsule Take 1 capsule (250 mcg total) by mouth 2 (two) times daily. 180 capsule 1   furosemide (LASIX) 20 MG tablet Take one tablet by mouth on Monday, Wednesday and Friday     irbesartan (AVAPRO) 75 MG tablet Take 1 tablet (75 mg total) by mouth daily. 90 tablet 3   Misc Natural Products (COLON CLEANSE) CAPS Take 4 capsules by mouth as needed (constipation).     OVER THE COUNTER MEDICATION Take 3 capsules by mouth daily. [place], Bone Strength     Polyethyl Glycol-Propyl Glycol (SYSTANE) 0.4-0.3 % SOLN Place 1 drop into both eyes as needed.     polyethylene glycol (MIRALAX / GLYCOLAX) 17 g packet Take 17 g by mouth 2 (two) times daily.     PRESCRIPTION MEDICATION at bedtime. CPAP     rOPINIRole (REQUIP) 0.25 MG tablet Take 1 tablet in the morning and three tablets after dinner po 360 tablet 3   Tiotropium Bromide-Olodaterol (STIOLTO RESPIMAT) 2.5-2.5 MCG/ACT AERS USE 2 INHALATIONS ORALLY   EVERY MORNING 12 g 3   Vitamin D-Vitamin K (VITAMIN K2-VITAMIN D3 PO) Take 1 Capful by mouth daily.     diltiazem (TIAZAC) 240 MG 24 hr capsule Take 1 capsule by mouth daily.     Current Facility-Administered Medications on File Prior to Visit  Medication Dose Route Frequency Provider Last Rate Last Admin   technetium tetrofosmin (TC-MYOVIEW) injection 12.8 millicurie  78.6 millicurie Intravenous Once PRN Skeet Latch, MD        LABS/IMAGING: No results found for this or any previous visit (from the past 48 hour(s)). No results found.  LIPID PANEL:    Component Value Date/Time   CHOL 231 (H) 05/26/2022 1046   CHOL 149 04/11/2020 0838   TRIG 98 05/26/2022 1046   HDL 86 05/26/2022 1046   HDL 69 04/11/2020 0838   CHOLHDL 2.7 05/26/2022 1046   LDLCALC 125 (H) 05/26/2022 1046     WEIGHTS: Wt Readings from Last 3 Encounters:  07/17/22  129 lb 6.4 oz  (58.7 kg)  07/09/22 129 lb 12.8 oz (58.9 kg)  05/27/22 128 lb 12.8 oz (58.4 kg)    VITALS: BP 130/66   Pulse (!) 50   Ht '5\' 2"'$  (1.575 m)   Wt 129 lb 6.4 oz (58.7 kg)   SpO2 93%   BMI 23.67 kg/m   EXAM: General appearance: alert and no distress Neck: no carotid bruit, no JVD and thyroid not enlarged, symmetric, no tenderness/mass/nodules Lungs: clear to auscultation bilaterally Heart: regular rate and rhythm Abdomen: soft, non-tender; bowel sounds normal; no masses,  no organomegaly Extremities: extremities normal, atraumatic, no cyanosis or edema Pulses: 2+ and symmetric Skin: Skin color, texture, turgor normal. No rashes or lesions Neurologic: Grossly normal Psych: Pleasant  EKG: Deferred  ASSESSMENT: History of atrial fibrillation, status post DCCV Paroxysmal atrial flutter Palpitations CHADSVASC score of 4 - on Eliquis Hypertension  Dyslipidemia  Daytime somnolence/fatigue  PLAN: 1.   Holly Benson continues to do well with her atrial fibrillation/flutter now on dofetilide.  She is on reduced dose Eliquis due to age and low weight.  She is scheduled for upcoming surgery for which she is at acceptable risk.  She could hold Eliquis for 3 days prior to the procedure and restart when safe afterwards.  Plan follow-up with me annually or sooner as necessary.  Pixie Casino, MD, [facility], [place] Director of the Advanced Lipid Disorders &  Cardiovascular Risk Reduction Clinic Diplomate of the American Board of Clinical Lipidology Attending Cardiologist  Direct Dial: [phone]  Fax: [phone]  Website:  [email or web address] [email or web address] Fendrick 07/17/2022, 9:15 AM

## 2022-07-17 NOTE — Patient Instructions (Signed)
Medication Instructions:  NO CHANGES  *If you need a refill on your cardiac medications before your next appointment, please call your pharmacy*    Follow-Up: At [facility], you and your health needs are our priority.  As part of our continuing mission to provide you with exceptional heart care, we have created designated Provider Care Teams.  These Care Teams include your primary Cardiologist (physician) and Advanced Practice Providers (APPs -  Physician Assistants and Nurse Practitioners) who all work together to provide you with the care you need, when you need it.  We recommend signing up for the patient portal called "MyChart".  Sign up information is provided on this After Visit Summary.  MyChart is used to connect with patients for Virtual Visits (Telemedicine).  Patients are able to view lab/test results, encounter notes, upcoming appointments, etc.  Non-urgent messages can be sent to your provider as well.   To learn more about what you can do with MyChart, go to [email or web address].    Your next appointment:   12 month(s)  The format for your next appointment:   In Person  Provider:   Kenneth C Hilty, MD   

## 2022-07-21 DIAGNOSIS — L570 Actinic keratosis: Secondary | ICD-10-CM | POA: Diagnosis not present

## 2022-07-21 DIAGNOSIS — Z85828 Personal history of other malignant neoplasm of skin: Secondary | ICD-10-CM | POA: Diagnosis not present

## 2022-07-22 ENCOUNTER — Encounter: Payer: Self-pay | Admitting: Family

## 2022-07-22 DIAGNOSIS — M85832 Other specified disorders of bone density and structure, left forearm: Secondary | ICD-10-CM | POA: Insufficient documentation

## 2022-07-22 DIAGNOSIS — M810 Age-related osteoporosis without current pathological fracture: Secondary | ICD-10-CM | POA: Insufficient documentation

## 2022-07-25 ENCOUNTER — Encounter: Payer: Self-pay | Admitting: Gastroenterology

## 2022-08-03 HISTORY — PX: HERNIA REPAIR: SHX51

## 2022-08-06 ENCOUNTER — Ambulatory Visit
Admission: RE | Admit: 2022-08-06 | Discharge: 2022-08-06 | Disposition: A | Discharge status: Home / Self Care | Payer: Medicare Other | Source: Ambulatory Visit | Attending: Gastroenterology | Admitting: Gastroenterology

## 2022-08-06 DIAGNOSIS — R933 Abnormal findings on diagnostic imaging of other parts of digestive tract: Secondary | ICD-10-CM

## 2022-08-06 DIAGNOSIS — K6389 Other specified diseases of intestine: Secondary | ICD-10-CM | POA: Diagnosis not present

## 2022-08-06 DIAGNOSIS — N200 Calculus of kidney: Secondary | ICD-10-CM | POA: Diagnosis not present

## 2022-08-06 DIAGNOSIS — K439 Ventral hernia without obstruction or gangrene: Secondary | ICD-10-CM | POA: Diagnosis not present

## 2022-08-06 DIAGNOSIS — Z0389 Encounter for observation for other suspected diseases and conditions ruled out: Secondary | ICD-10-CM | POA: Diagnosis not present

## 2022-08-06 DIAGNOSIS — K429 Umbilical hernia without obstruction or gangrene: Secondary | ICD-10-CM | POA: Diagnosis not present

## 2022-08-07 ENCOUNTER — Other Ambulatory Visit (HOSPITAL_BASED_OUTPATIENT_CLINIC_OR_DEPARTMENT_OTHER): Payer: Medicare Other

## 2022-08-13 DIAGNOSIS — K439 Ventral hernia without obstruction or gangrene: Secondary | ICD-10-CM | POA: Diagnosis not present

## 2022-08-13 DIAGNOSIS — J449 Chronic obstructive pulmonary disease, unspecified: Secondary | ICD-10-CM | POA: Diagnosis not present

## 2022-08-13 DIAGNOSIS — I10 Essential (primary) hypertension: Secondary | ICD-10-CM | POA: Diagnosis not present

## 2022-08-13 DIAGNOSIS — G2581 Restless legs syndrome: Secondary | ICD-10-CM | POA: Diagnosis not present

## 2022-08-13 DIAGNOSIS — K5900 Constipation, unspecified: Secondary | ICD-10-CM | POA: Diagnosis not present

## 2022-08-13 DIAGNOSIS — R600 Localized edema: Secondary | ICD-10-CM | POA: Diagnosis not present

## 2022-08-13 DIAGNOSIS — Z87891 Personal history of nicotine dependence: Secondary | ICD-10-CM | POA: Diagnosis not present

## 2022-08-13 DIAGNOSIS — I4891 Unspecified atrial fibrillation: Secondary | ICD-10-CM | POA: Diagnosis not present

## 2022-08-13 DIAGNOSIS — Z7901 Long term (current) use of anticoagulants: Secondary | ICD-10-CM | POA: Diagnosis not present

## 2022-08-13 DIAGNOSIS — I739 Peripheral vascular disease, unspecified: Secondary | ICD-10-CM | POA: Diagnosis not present

## 2022-08-14 ENCOUNTER — Telehealth (HOSPITAL_COMMUNITY): Payer: Self-pay | Admitting: *Deleted

## 2022-08-14 ENCOUNTER — Ambulatory Visit (INDEPENDENT_AMBULATORY_CARE_PROVIDER_SITE_OTHER): Payer: Medicare Other | Admitting: Emergency Medicine

## 2022-08-14 ENCOUNTER — Encounter: Payer: Self-pay | Admitting: Emergency Medicine

## 2022-08-14 VITALS — BP 138/68 | HR 100 | Temp 97.6°F | Ht 62.0 in | Wt 131.0 lb

## 2022-08-14 DIAGNOSIS — R9389 Abnormal findings on diagnostic imaging of other specified body structures: Secondary | ICD-10-CM

## 2022-08-14 DIAGNOSIS — J449 Chronic obstructive pulmonary disease, unspecified: Secondary | ICD-10-CM | POA: Diagnosis not present

## 2022-08-14 MED ORDER — DOFETILIDE 250 MCG PO CAPS: 250.0000 ug | ORAL_CAPSULE | Freq: Two times a day (BID) | ORAL | 3 refills | Status: DC

## 2022-08-14 NOTE — Progress Notes (Signed)
Subjective:    Patient ID: Holly Benson, female    DOB: 06-22-1933, 88 y.o.   MRN: [id number]  COPD She complains of cough and shortness of breath. There is no wheezing. Pertinent negatives include no ear pain, fever, headaches, postnasal drip, rhinorrhea, sneezing, sore throat or trouble swallowing. Her past medical history is significant for COPD.    ROV 04/08/2022 --follow-up visit for 88 year old woman with a history of severe COPD, A Fib on dofetilide. GERD with concern for possible intermittent aspiration, OSA/OHS on the Servo vent, chronic rhinitis.  We have been following pulmonary nodular disease on serial CT scans of the chest. Currently managed on Stiolto  Repeat CT scan of the chest for/25/23 reviewed by me, showed a 1.3 x 0.8 cm solid subpleural right upper lobe pulmonary nodule slightly increased in size compared with prior scan.  PET scan 03/20/2022 reviewed by me shows that the right upper lobe pulmonary nodule is hypermetabolic.  There are no mediastinal or hilar lymph nodes present.  ROV 08/14/22 --88 year old woman with severe COPD managed on Stiolto.  She has atrial fibrillation on dofetilide, GERD, question intermittent aspiration, OSA/OHS on a Servo vent, chronic rhinitis.  We have been following pulmonary nodular disease with serial imaging.  She has a right upper lobe pulmonary nodule that was hypermetabolic on PET scan in May 2023.  Most recent CT chest as below. She is feeling well. She is planning for a hernia surgery under general anesthesia soon.   CT chest 07/12/2022 reviewed by me shows no change in size or appearance of the 1.3 x 0.8 cm right upper lobe pulmonary nodule.   Review of Systems  Constitutional:  Negative for fever and unexpected weight change.  HENT:  Negative for congestion, dental problem, ear pain, nosebleeds, postnasal drip, rhinorrhea, sinus pressure, sneezing, sore throat and trouble swallowing.   Eyes:  Negative for redness and itching.   Respiratory:  Positive for cough and shortness of breath. Negative for chest tightness and wheezing.   Cardiovascular:  Negative for palpitations and leg swelling.  Gastrointestinal:  Negative for nausea and vomiting.  Genitourinary:  Negative for dysuria.  Musculoskeletal:  Negative for joint swelling.  Skin:  Negative for rash.  Neurological:  Negative for headaches.  Hematological:  Does not bruise/bleed easily.  Psychiatric/Behavioral:  Negative for dysphoric mood. The patient is not nervous/anxious.        Objective:   Physical Exam Vitals:   08/14/22 1130  BP: 138/68  Pulse: 100  Temp: 97.6 F (36.4 C)  TempSrc: Oral  SpO2: 96%  Weight: 131 lb (59.4 kg)  Height: '5\' 2"'$  (1.575 m)   Gen: Pleasant, thin woman, in no distress,  normal affect, mild kyphosis  ENT: No lesions,  mouth clear,  oropharynx clear, no postnasal drip  Neck: No JVD, no stridor  Lungs: No use of accessory muscles, few bibasilar inspiratory crackles  Cardiovascular: RRR, heart sounds normal, no murmur or gallops, no peripheral edema  Musculoskeletal: joints of hands swollen, nodular  Neuro: alert, non focal  Skin: Warm, no lesions or rashes      Assessment & Plan:  Abnormal CT of the chest Her right upper lobe pulmonary nodule has not changed in size or appearance on the most recent CT chest.  Stable in size over 6 months but it is hypermetabolic and has slowly grown over a longer interval.  Most consistent with a non-small cell lung cancer.  She understands this.  We are continuing to watch but will likely  commit to navigational bronchoscopy, biopsies to guide therapy at some point.  She would like to follow for now and we will continue to revisit.  We will repeat her CT chest in 6 months.  If the nodules are growing or depending on clinical status we may decide to pursue biopsy at that time.  COPD (chronic obstructive pulmonary disease) ([place]) Well-managed on Stiolto  Time spent 31  minutes  Baltazar Apo, MD, PhD 08/14/2022, 12:57 PM [facility] [phone] or if no answer [phone]

## 2022-08-14 NOTE — Telephone Encounter (Signed)
-----   Message from Willaim Sheng, [place] sent at 08/14/2022  1:55 PM EDT ----- Regarding: FW: refill Hey can you please send this in pt's chart, so all information can be documented.   Thanks, Tye Maryland, CMA ----- Message ----- From: Juluis Mire, RN Sent: 08/14/2022   1:50 PM EDT To: [facility] Subject: FW: refill                                     Follows with renee  ----- Message ----- From: Claretha Cooper Sent: 08/14/2022   1:16 PM EDT To: Juluis Mire, RN Subject: refill                                         Pharmacy called and was asking about getting a refill on her dofetilide DOB 12-09-1932

## 2022-08-14 NOTE — Assessment & Plan Note (Signed)
Her right upper lobe pulmonary nodule has not changed in size or appearance on the most recent CT chest.  Stable in size over 6 months but it is hypermetabolic and has slowly grown over a longer interval.  Most consistent with a non-small cell lung cancer.  She understands this.  We are continuing to watch but will likely commit to navigational bronchoscopy, biopsies to guide therapy at some point.  She would like to follow for now and we will continue to revisit.  We will repeat her CT chest in 6 months.  If the nodules are growing or depending on clinical status we may decide to pursue biopsy at that time.

## 2022-08-14 NOTE — Assessment & Plan Note (Signed)
Well-managed on [employer]

## 2022-08-14 NOTE — Patient Instructions (Addendum)
We will plan to repeat your CT scan of the chest in March 2024 to follow a small right upper lobe pulmonary nodule. Please follow Dr. Lamonte Sakai in March after the CT so we can review the results together. Please continue your Stiolto 2 puffs once daily as you have been taking it

## 2022-08-14 NOTE — Telephone Encounter (Signed)
Pt's medication was sent to pt's pharmacy as requested. Confirmation received.  °

## 2022-08-25 DIAGNOSIS — I10 Essential (primary) hypertension: Secondary | ICD-10-CM | POA: Diagnosis not present

## 2022-08-25 DIAGNOSIS — K5900 Constipation, unspecified: Secondary | ICD-10-CM | POA: Diagnosis not present

## 2022-08-25 DIAGNOSIS — K439 Ventral hernia without obstruction or gangrene: Secondary | ICD-10-CM | POA: Diagnosis not present

## 2022-08-25 DIAGNOSIS — I440 Atrioventricular block, first degree: Secondary | ICD-10-CM | POA: Diagnosis not present

## 2022-08-25 DIAGNOSIS — I4891 Unspecified atrial fibrillation: Secondary | ICD-10-CM | POA: Diagnosis not present

## 2022-08-25 DIAGNOSIS — J449 Chronic obstructive pulmonary disease, unspecified: Secondary | ICD-10-CM | POA: Diagnosis not present

## 2022-08-25 DIAGNOSIS — I739 Peripheral vascular disease, unspecified: Secondary | ICD-10-CM | POA: Diagnosis not present

## 2022-08-26 DIAGNOSIS — I10 Essential (primary) hypertension: Secondary | ICD-10-CM | POA: Diagnosis not present

## 2022-08-26 DIAGNOSIS — I739 Peripheral vascular disease, unspecified: Secondary | ICD-10-CM | POA: Diagnosis not present

## 2022-08-26 DIAGNOSIS — I4891 Unspecified atrial fibrillation: Secondary | ICD-10-CM | POA: Diagnosis not present

## 2022-08-26 DIAGNOSIS — K5900 Constipation, unspecified: Secondary | ICD-10-CM | POA: Diagnosis not present

## 2022-08-26 DIAGNOSIS — J449 Chronic obstructive pulmonary disease, unspecified: Secondary | ICD-10-CM | POA: Diagnosis not present

## 2022-08-26 DIAGNOSIS — K439 Ventral hernia without obstruction or gangrene: Secondary | ICD-10-CM | POA: Diagnosis not present

## 2022-08-29 ENCOUNTER — Encounter: Payer: Self-pay | Admitting: Internal Medicine

## 2022-09-17 DIAGNOSIS — Z48815 Encounter for surgical aftercare following surgery on the digestive system: Secondary | ICD-10-CM | POA: Diagnosis not present

## 2022-09-22 ENCOUNTER — Ambulatory Visit (INDEPENDENT_AMBULATORY_CARE_PROVIDER_SITE_OTHER): Payer: Medicare Other | Admitting: Family

## 2022-09-22 ENCOUNTER — Encounter: Payer: Self-pay | Admitting: Family

## 2022-09-22 VITALS — BP 110/74 | HR 84 | Temp 97.6°F | Resp 16 | Ht 62.0 in | Wt 133.4 lb

## 2022-09-22 DIAGNOSIS — R5383 Other fatigue: Secondary | ICD-10-CM

## 2022-09-22 DIAGNOSIS — R0989 Other specified symptoms and signs involving the circulatory and respiratory systems: Secondary | ICD-10-CM | POA: Diagnosis not present

## 2022-09-22 DIAGNOSIS — R059 Cough, unspecified: Secondary | ICD-10-CM | POA: Diagnosis not present

## 2022-09-22 DIAGNOSIS — R6883 Chills (without fever): Secondary | ICD-10-CM

## 2022-09-22 LAB — POC COVID19 BINAXNOW: SARS Coronavirus 2 Ag: NEGATIVE

## 2022-09-22 MED ORDER — DOXYCYCLINE HYCLATE 100 MG PO TABS: 100.0000 mg | ORAL_TABLET | Freq: Two times a day (BID) | ORAL | 0 refills | Status: AC

## 2022-09-22 NOTE — Progress Notes (Signed)
Provider: Marlowe Sax FNP-C  Latissa Frick, Nelda Bucks, NP  Patient Care Team: Azadeh Hyder, Nelda Bucks, NP as PCP - General (Family Medicine) Pixie Casino, MD as PCP - Cardiology (Cardiology) Constance Haw, MD as PCP - Electrophysiology (Cardiology)  Extended Emergency Contact Information Primary Emergency Contact: Signa Kell [place] Phone: [phone] Relation: Daughter  Code Status:  Full Code  Goals of care: Advanced Directive information    09/22/2022    1:37 PM  Advanced Directives  Does Patient Have a Medical Advance Directive? Yes  Type of Paramedic of [place];Living will  Does patient want to make changes to medical advance directive? No - Patient declined  Copy of [place] in Chart? No - copy requested     Chief Complaint  Patient presents with   Acute Visit    Patient complains of cough, fatigue, chills, runny nose, and sore throat when coughing. Patient states symptoms started Thursday 09/18/2022.    HPI:  Pt is a 88 y.o. female seen today for an acute visit for evaluation of cough,fatigue,chills,runny nose and sore throat x 5 days.  Has been taking mucinex DM.Son in law had a cough last week.states no contact with any persons sick with COVID-19.she denies any fever,cough,fatigue,body aches,chest tightness,chest pain,palpitation or shortness of breath.    Past Medical History:  Diagnosis Date   Arrhythmia    Arthritis    Atrial fibrillation (HCC)    Constipation    chronic   COPD (chronic obstructive pulmonary disease) (HCC)    Hyperlipidemia    Hypertension    pulmonary   OSA on CPAP    Osteoarthritis    Osteoporosis    RLS (restless legs syndrome)    Sleep apnea    Past Surgical History:  Procedure Laterality Date   ABDOMINAL AORTOGRAM W/LOWER EXTREMITY N/A 03/21/2020   Procedure: ABDOMINAL AORTOGRAM W/ Bilateral LOWER EXTREMITY [place];  Surgeon: Wellington Hampshire, MD;   Location: [facility];  Service: Cardiovascular;  Laterality: N/A;   ABDOMINAL HYSTERECTOMY     arm surgery Right    Broken arm and has a plate in it   CATARACT EXTRACTION Bilateral    COLONOSCOPY     More than 10 years ago In [place] ARTHROSCOPY Right    PERIPHERAL VASCULAR INTERVENTION Bilateral 03/21/2020   Procedure: PERIPHERAL VASCULAR INTERVENTION;  Surgeon: Wellington Hampshire, MD;  Location: [facility];  Service: Cardiovascular;  Laterality: Bilateral;  external iliac    Allergies  Allergen Reactions   Lovenox [Enoxaparin Sodium] Hives and Rash    PT STATES SHE BROKE OUT IN A RASH HEAD TO TOE AND LASTED ABOUT 3 WEEKS    Metoprolol Succinate [Metoprolol] Other (See Comments)    Muscle aches, hand pain, and tingling    Outpatient Encounter Medications as of 09/22/2022  Medication Sig   acetaminophen (TYLENOL) 325 MG tablet Take 650 mg by mouth as needed for moderate pain or headache.   albuterol (VENTOLIN HFA) 108 (90 Base) MCG/ACT inhaler Inhale 2 puffs into the lungs every 6 (six) hours as needed for wheezing or shortness of breath.   apixaban (ELIQUIS) 2.5 MG TABS tablet Take 1 tablet (2.5 mg total) by mouth 2 (two) times daily.   clonazePAM (KLONOPIN) 0.5 MG tablet Take 0.25 mg by mouth at bedtime as needed for anxiety.   diltiazem (TIAZAC) 240 MG 24 hr capsule Take 1 capsule by mouth daily.   docusate  sodium (COLACE) 100 MG capsule Take 300 mg by mouth at bedtime.   dofetilide (TIKOSYN) 250 MCG capsule Take 1 capsule (250 mcg total) by mouth 2 (two) times daily.   furosemide (LASIX) 20 MG tablet Take one tablet by mouth on Monday, Wednesday and Friday   irbesartan (AVAPRO) 75 MG tablet Take 1 tablet (75 mg total) by mouth daily.   Misc Natural Products (COLON CLEANSE) CAPS Take 4 capsules by mouth as needed (constipation).   OVER THE COUNTER MEDICATION Take 3 capsules by mouth daily. [place], Bone Strength   Polyethyl Glycol-Propyl Glycol  (SYSTANE) 0.4-0.3 % SOLN Place 1 drop into both eyes as needed.   polyethylene glycol (MIRALAX / GLYCOLAX) 17 g packet Take 17 g by mouth 2 (two) times daily.   PRESCRIPTION MEDICATION at bedtime. CPAP   rOPINIRole (REQUIP) 0.25 MG tablet Take 1 tablet in the morning and three tablets after dinner po   Tiotropium Bromide-Olodaterol (STIOLTO RESPIMAT) 2.5-2.5 MCG/ACT AERS USE 2 INHALATIONS ORALLY   EVERY MORNING   Vitamin D-Vitamin K (VITAMIN K2-VITAMIN D3 PO) Take 1 Capful by mouth daily.   Facility-Administered Encounter Medications as of 09/22/2022  Medication   technetium tetrofosmin (TC-MYOVIEW) injection 77.8 millicurie    Review of Systems  Constitutional:  Positive for chills. Negative for appetite change, fatigue and fever.  HENT:  Positive for congestion, postnasal drip, rhinorrhea and sore throat.   Respiratory:  Positive for cough. Negative for chest tightness, shortness of breath and wheezing.   Cardiovascular:  Negative for chest pain, palpitations and leg swelling.  Gastrointestinal:  Negative for abdominal distention, abdominal pain, diarrhea, nausea and vomiting.  Skin:  Negative for color change, pallor and rash.  Neurological:  Negative for dizziness, light-headedness and headaches.    Immunization History  Administered Date(s) Administered   Influenza, High Dose Seasonal PF 08/03/2017, 07/15/2018, 06/28/2019, 08/03/2020   Influenza,inj,Quad PF,6+ Mos 08/02/2018   Influenza-Unspecified 08/27/2021, 08/01/2022   PFIZER(Purple Top)SARS-COV-2 Vaccination 12/08/2019, 01/02/2020, 10/02/2020, 03/04/2021   Pneumococcal Polysaccharide-23 01/27/2020   Zoster Recombinat (Shingrix) 12/25/2017, 03/04/2021   Pertinent  Health Maintenance Due  Topic Date Due   INFLUENZA VACCINE  Completed   DEXA SCAN  Completed      02/20/2022    8:00 PM 02/21/2022    7:10 AM 04/26/2022    4:49 PM 05/26/2022   10:03 AM 09/22/2022    1:37 PM  Fall Risk  Falls in the past year?    0 0  Was  there an injury with Fall?    0 0  Fall Risk Category Calculator    0 0  Fall Risk Category    Low Low  Patient Fall Risk Level Low fall risk Low fall risk Low fall risk Low fall risk Low fall risk  Patient at Risk for Falls Due to    No Fall Risks No Fall Risks  Fall risk Follow up    Falls evaluation completed Falls evaluation completed   Functional Status Survey:    Vitals:   09/22/22 1336  BP: 110/74  Pulse: 84  Resp: 16  Temp: 97.6 F (36.4 C)  SpO2: 94%  Weight: 133 lb 6.4 oz (60.5 kg)  Height: '5\' 2"'$  (1.575 m)   Body mass index is 24.4 kg/m. Physical Exam Vitals reviewed.  Constitutional:      General: She is not in acute distress.    Appearance: Normal appearance. She is normal weight. She is not ill-appearing or diaphoretic.  HENT:     Head:  Normocephalic.     Right Ear: Tympanic membrane, ear canal and external ear normal. There is no impacted cerumen.     Left Ear: Tympanic membrane, ear canal and external ear normal. There is no impacted cerumen.     Nose: Nose normal. No congestion or rhinorrhea.     Mouth/Throat:     Mouth: Mucous membranes are moist.     Pharynx: Oropharynx is clear. No oropharyngeal exudate or posterior oropharyngeal erythema.  Eyes:     General: No scleral icterus.       Right eye: No discharge.        Left eye: No discharge.     Extraocular Movements: Extraocular movements intact.     Conjunctiva/sclera: Conjunctivae normal.     Pupils: Pupils are equal, round, and reactive to light.  Neck:     Vascular: No carotid bruit.  Cardiovascular:     Rate and Rhythm: Normal rate and regular rhythm.     Pulses: Normal pulses.     Heart sounds: Normal heart sounds. No murmur heard.    No friction rub. No gallop.  Pulmonary:     Effort: Pulmonary effort is normal. No respiratory distress.     Breath sounds: Normal breath sounds. No wheezing, rhonchi or rales.     Comments: Left lung coarse clears with cough  Chest:     Chest wall: No  tenderness.  Abdominal:     General: Bowel sounds are normal. There is no distension.     Palpations: Abdomen is soft. There is no mass.     Tenderness: There is no abdominal tenderness. There is no right CVA tenderness, left CVA tenderness, guarding or rebound.  Musculoskeletal:        General: No swelling or tenderness. Normal range of motion.     Cervical back: Normal range of motion. No rigidity or tenderness.     Comments: Trace bilateral edema   Lymphadenopathy:     Cervical: No cervical adenopathy.  Skin:    General: Skin is warm and dry.     Coloration: Skin is not pale.     Findings: No bruising, erythema, lesion or rash.  Neurological:     Mental Status: She is alert and oriented to person, place, and time.     Motor: No weakness.     Gait: Gait normal.  Psychiatric:        Mood and Affect: Mood normal.        Speech: Speech normal.        Behavior: Behavior normal.     Labs reviewed: Recent Labs    04/01/22 1205 04/26/22 1743 04/29/22 1514 05/26/22 1046 07/09/22 1010  NA 138 134*  --  138 139  K 4.7 3.6 4.6 4.0 4.5  CL 100 99  --  100 100  CO2 25 22  --  23 25  GLUCOSE 98 116*  --  93 94  BUN 19 10  --  13 17  CREATININE 0.91 0.73  --  0.81 0.79  CALCIUM 10.0 9.6  --  9.8 9.6  MG 2.2  --  2.6*  --  2.2   Recent Labs    04/26/22 1743 05/26/22 1046  AST 21 20  ALT 19 17  ALKPHOS 112  --   BILITOT 0.8 0.7  PROT 7.5 6.9  ALBUMIN 4.7  --    Recent Labs    03/10/22 1539 04/26/22 1743 05/26/22 1046  WBC 6.6 11.5* 7.5  NEUTROABS 3.8 8.9*  5,498  HGB 14.0 16.1* 15.6*  HCT 41.7 47.1* 44.9  MCV 99.2 95.9 97.6  PLT 318.0 336 309   Lab Results  Component Value Date   TSH 2.09 05/26/2022   No results found for: "HGBA1C" Lab Results  Component Value Date   CHOL 231 (H) 05/26/2022   HDL 86 05/26/2022   LDLCALC 125 (H) 05/26/2022   TRIG 98 05/26/2022   CHOLHDL 2.7 05/26/2022    Significant Diagnostic Results in last 30 days:  No results  found.  Assessment/Plan  1. Cough, unspecified type Afebrile  Left lung coarse breath sounds.will treat clinically with antibiotics as below.  Side effects discussed. - POC COVID-19 negative  - doxycycline (VIBRA-TABS) 100 MG tablet; Take 1 tablet (100 mg total) by mouth 2 (two) times daily for 7 days.  Dispense: 14 tablet; Refill: 0  2. Chills (without fever) Advised to take over-the-counter Tylenol as needed for fever and chills - POC COVID-19  3. Fatigue, unspecified type Antibiotics as follow-up Encouraged to increase fluid intake - POC COVID-19  4. Runny nose Over-the-counter loratadine 10 mg tablet 1 by mouth daily for 14 days - POC COVID-19  Family/ staff Communication: Reviewed plan of care with patient verbalized understanding  Labs/tests ordered:  - POC COVID-19  Next Appointment: Return in about 2 days (around 09/24/2022) for Annual wellness visit.   Sandrea Hughs, NP

## 2022-09-22 NOTE — Patient Instructions (Signed)
-   Notify provider if symptoms worsen or fail to improve  °

## 2022-09-24 ENCOUNTER — Encounter: Payer: Self-pay | Admitting: Family

## 2022-09-24 ENCOUNTER — Ambulatory Visit (INDEPENDENT_AMBULATORY_CARE_PROVIDER_SITE_OTHER): Payer: Medicare Other | Admitting: Family

## 2022-09-24 DIAGNOSIS — Z0000 Encounter for general adult medical examination without abnormal findings: Secondary | ICD-10-CM

## 2022-09-24 NOTE — Patient Instructions (Signed)
[name] , Thank you for taking time to come for your Medicare Wellness Visit. I appreciate your ongoing commitment to your health goals. Please review the following plan we discussed and let me know if I can assist you in the future.   Screening recommendations/referrals: Colonoscopy N/A Mammogram ; N/A  Bone Density : Up to date  Recommended yearly ophthalmology/optometry visit for glaucoma screening and checkup Recommended yearly dental visit for hygiene and checkup  Vaccinations: Influenza vaccine- due annually in September/October Pneumococcal vaccine : Due  Tdap vaccine : Up to date  Shingles vaccine  : Up to date     Advanced directives: yes   Conditions/risks identified: Cardiac Risk Factors include: advanced age (>69mn, >>48women);smoking/ tobacco exposure;hypertension  Next appointment: 1 year    Preventive Care 642Years and Older, Female Preventive care refers to lifestyle choices and visits with your health care provider that can promote health and wellness. What does preventive care include? A yearly physical exam. This is also called an annual well check. Dental exams once or twice a year. Routine eye exams. Ask your health care provider how often you should have your eyes checked. Personal lifestyle choices, including: Daily care of your teeth and gums. Regular physical activity. Eating a healthy diet. Avoiding tobacco and drug use. Limiting alcohol use. Practicing safe sex. Taking low-dose aspirin every day. Taking vitamin and mineral supplements as recommended by your health care provider. What happens during an annual well check? The services and screenings done by your health care provider during your annual well check will depend on your age, overall health, lifestyle risk factors, and family history of disease. Counseling  Your health care provider may ask you questions about your: Alcohol use. Tobacco use. Drug use. Emotional well-being. Home  and relationship well-being. Sexual activity. Eating habits. History of falls. Memory and ability to understand (cognition). Work and work [employer]ian Reproductive health. Screening  You may have the following tests or measurements: Height, weight, and BMI. Blood pressure. Lipid and cholesterol levels. These may be checked every 5 years, or more frequently if you are over 564years old. Skin check. Lung cancer screening. You may have this screening every year starting at age 3518if you have a 30-pack-year history of smoking and currently smoke or have quit within the past 15 years. Fecal occult blood test (FOBT) of the stool. You may have this test every year starting at age 353 Flexible sigmoidoscopy or colonoscopy. You may have a sigmoidoscopy every 5 years or a colonoscopy every 10 years starting at age 86 Hepatitis C blood test. Hepatitis B blood test. Sexually transmitted disease (STD) testing. Diabetes screening. This is done by checking your blood sugar (glucose) after you have not eaten for a while (fasting). You may have this done every 1-3 years. Bone density scan. This is done to screen for osteoporosis. You may have this done starting at age 86 Mammogram. This may be done every 1-2 years. Talk to your health care provider about how often you should have regular mammograms. Talk with your health care provider about your test results, treatment options, and if necessary, the need for more tests. Vaccines  Your health care provider may recommend certain vaccines, such as: Influenza vaccine. This is recommended every year. Tetanus, diphtheria, and acellular pertussis (Tdap, Td) vaccine. You may need a Td booster every 10 years. Zoster vaccine. You may need this after age 86 Pneumococcal 13-valent conjugate (PCV13) vaccine. One dose is recommended after age 86 Pneumococcal  polysaccharide (PPSV23) vaccine. One dose is recommended after age 60. Talk to your health care provider  about which screenings and vaccines you need and how often you need them. This information is not intended to replace advice given to you by your health care provider. Make sure you discuss any questions you have with your health care provider. Document Released: 11/16/2015 Document Revised: 07/09/2016 Document Reviewed: 08/21/2015 Elsevier Interactive Patient Education  2017 [place] Prevention in the Home Falls can cause injuries. They can happen to people of all ages. There are many things you can do to make your home safe and to help prevent falls. What can I do on the outside of my home? Regularly fix the edges of walkways and driveways and fix any cracks. Remove anything that might make you trip as you walk through a door, such as a raised step or threshold. Trim any bushes or trees on the path to your home. Use bright outdoor lighting. Clear any walking paths of anything that might make someone trip, such as rocks or tools. Regularly check to see if handrails are loose or broken. Make sure that both sides of any steps have handrails. Any raised decks and porches should have guardrails on the edges. Have any leaves, snow, or ice cleared regularly. Use sand or salt on walking paths during winter. Clean up any spills in your garage right away. This includes oil or grease spills. What can I do in the bathroom? Use night lights. Install grab bars by the toilet and in the tub and shower. Do not use towel bars as grab bars. Use non-skid mats or decals in the tub or shower. If you need to sit down in the shower, use a plastic, non-slip stool. Keep the floor dry. Clean up any water that spills on the floor as soon as it happens. Remove soap buildup in the tub or shower regularly. Attach bath mats securely with double-sided non-slip rug tape. Do not have throw rugs and other things on the floor that can make you trip. What can I do in the bedroom? Use night lights. Make sure  that you have a light by your bed that is easy to reach. Do not use any sheets or blankets that are too big for your bed. They should not hang down onto the floor. Have a firm chair that has side arms. You can use this for support while you get dressed. Do not have throw rugs and other things on the floor that can make you trip. What can I do in the kitchen? Clean up any spills right away. Avoid walking on wet floors. Keep items that you use a lot in easy-to-reach places. If you need to reach something above you, use a strong step stool that has a grab bar. Keep electrical cords out of the way. Do not use floor polish or wax that makes floors slippery. If you must use wax, use non-skid floor wax. Do not have throw rugs and other things on the floor that can make you trip. What can I do with my stairs? Do not leave any items on the stairs. Make sure that there are handrails on both sides of the stairs and use them. Fix handrails that are broken or loose. Make sure that handrails are as long as the stairways. Check any carpeting to make sure that it is firmly attached to the stairs. Fix any carpet that is loose or worn. Avoid having throw rugs at the top  or bottom of the stairs. If you do have throw rugs, attach them to the floor with carpet tape. Make sure that you have a light switch at the top of the stairs and the bottom of the stairs. If you do not have them, ask someone to add them for you. What else can I do to help prevent falls? Wear shoes that: Do not have high heels. Have rubber bottoms. Are comfortable and fit you well. Are closed at the toe. Do not wear sandals. If you use a stepladder: Make sure that it is fully opened. Do not climb a closed stepladder. Make sure that both sides of the stepladder are locked into place. Ask someone to hold it for you, if possible. Clearly mark and make sure that you can see: Any grab bars or handrails. First and last steps. Where the edge of  each step is. Use tools that help you move around (mobility aids) if they are needed. These include: Canes. Walkers. Scooters. Crutches. Turn on the lights when you go into a dark area. Replace any light bulbs as soon as they burn out. Set up your furniture so you have a clear path. Avoid moving your furniture around. If any of your floors are uneven, fix them. If there are any pets around you, be aware of where they are. Review your medicines with your doctor. Some medicines can make you feel dizzy. This can increase your chance of falling. Ask your doctor what other things that you can do to help prevent falls. This information is not intended to replace advice given to you by your health care provider. Make sure you discuss any questions you have with your health care provider. Document Released: 08/16/2009 Document Revised: 03/27/2016 Document Reviewed: 11/24/2014 Elsevier Interactive Patient Education  2017 [employer].

## 2022-09-24 NOTE — Progress Notes (Signed)
This service is provided via telemedicine  No vital signs collected/recorded due to the encounter was a telemedicine visit.   Location of patient (ex: home, work):  Home  Patient consents to a telephone visit:  Yes  Location of the provider (ex: office, home):  [employer].  Name of any referring provider:  Sly Parlee, Nelda Bucks, NP   Names of all persons participating in the telemedicine service and their role in the encounter:  Patient, Holly Benson, [place], [place], Webb Silversmith, NP.    Time spent on call: 8 minutes spent on the phone with Medical Assistant.      Subjective:   Holly Benson is a 88 y.o. female who presents for Medicare Annual (Subsequent) preventive examination.  Review of Systems     Cardiac Risk Factors include: advanced age (>62mn, >>63women);smoking/ tobacco exposure;hypertension     Objective:    There were no vitals filed for this visit. There is no height or weight on file to calculate BMI.     09/24/2022    9:30 AM 09/22/2022    1:37 PM 05/26/2022   10:03 AM 04/26/2022    4:49 PM 02/18/2022    3:06 PM 09/02/2021   12:27 PM 03/21/2020    7:34 AM  Advanced Directives  Does Patient Have a Medical Advance Directive? Yes Yes Yes No No Yes Yes  Type of AParamedicof [place] will [place]Living will [place]Living will   [place] will [place] Does patient want to make changes to medical advance directive? No - Patient declined No - Patient declined No - Patient declined   No - Guardian declined No - Patient declined  Copy of HRobie Creekin Chart? No - copy requested No - copy requested No - copy requested    No - copy requested  Would patient like information on creating a medical advance directive?    No - Patient declined No - Patient declined  No - Patient declined    Current Medications  (verified) Outpatient Encounter Medications as of 09/24/2022  Medication Sig   acetaminophen (TYLENOL) 325 MG tablet Take 650 mg by mouth as needed for moderate pain or headache.   albuterol (VENTOLIN HFA) 108 (90 Base) MCG/ACT inhaler Inhale 2 puffs into the lungs every 6 (six) hours as needed for wheezing or shortness of breath.   apixaban (ELIQUIS) 2.5 MG TABS tablet Take 1 tablet (2.5 mg total) by mouth 2 (two) times daily.   clonazePAM (KLONOPIN) 0.5 MG tablet Take 0.25 mg by mouth at bedtime as needed for anxiety.   diltiazem (TIAZAC) 240 MG 24 hr capsule Take 1 capsule by mouth daily.   docusate sodium (COLACE) 100 MG capsule Take 300 mg by mouth at bedtime.   dofetilide (TIKOSYN) 250 MCG capsule Take 1 capsule (250 mcg total) by mouth 2 (two) times daily.   doxycycline (VIBRA-TABS) 100 MG tablet Take 1 tablet (100 mg total) by mouth 2 (two) times daily for 7 days.   furosemide (LASIX) 20 MG tablet Take one tablet by mouth on Monday, Wednesday and Friday   irbesartan (AVAPRO) 75 MG tablet Take 1 tablet (75 mg total) by mouth daily.   Misc Natural Products (COLON CLEANSE) CAPS Take 4 capsules by mouth as needed (constipation).   OVER THE COUNTER MEDICATION Take 3 capsules by mouth daily. [place], Bone Strength   Polyethyl Glycol-Propyl Glycol (SYSTANE) 0.4-0.3 % SOLN  Place 1 drop into both eyes as needed.   polyethylene glycol (MIRALAX / GLYCOLAX) 17 g packet Take 17 g by mouth 2 (two) times daily.   PRESCRIPTION MEDICATION at bedtime. CPAP   rOPINIRole (REQUIP) 0.25 MG tablet Take 1 tablet in the morning and three tablets after dinner po   Tiotropium Bromide-Olodaterol (STIOLTO RESPIMAT) 2.5-2.5 MCG/ACT AERS USE 2 INHALATIONS ORALLY   EVERY MORNING   Vitamin D-Vitamin K (VITAMIN K2-VITAMIN D3 PO) Take 1 Capful by mouth daily.   Facility-Administered Encounter Medications as of 09/24/2022  Medication   technetium tetrofosmin (TC-MYOVIEW) injection 39.7 millicurie    Allergies  (verified) Lovenox [enoxaparin sodium] and Metoprolol succinate [metoprolol]   History: Past Medical History:  Diagnosis Date   Arrhythmia    Arthritis    Atrial fibrillation (HCC)    Constipation    chronic   COPD (chronic obstructive pulmonary disease) (HCC)    Hyperlipidemia    Hypertension    pulmonary   OSA on CPAP    Osteoarthritis    Osteoporosis    RLS (restless legs syndrome)    Sleep apnea    Past Surgical History:  Procedure Laterality Date   ABDOMINAL AORTOGRAM W/LOWER EXTREMITY N/A 03/21/2020   Procedure: ABDOMINAL AORTOGRAM W/ Bilateral LOWER EXTREMITY [place];  Surgeon: Wellington Hampshire, MD;  Location: [facility];  Service: Cardiovascular;  Laterality: N/A;   ABDOMINAL HYSTERECTOMY     arm surgery Right    Broken arm and has a plate in it   CATARACT EXTRACTION Bilateral    COLONOSCOPY     More than 10 years ago In [place] ARTHROSCOPY Right    PERIPHERAL VASCULAR INTERVENTION Bilateral 03/21/2020   Procedure: PERIPHERAL VASCULAR INTERVENTION;  Surgeon: Wellington Hampshire, MD;  Location: [facility];  Service: Cardiovascular;  Laterality: Bilateral;  external iliac   Family History  Problem Relation Age of Onset   Suicidality Father    Stroke Maternal Grandfather    Hypertension Sister    Colon cancer Neg Hx    Esophageal cancer Neg Hx    Pancreatic cancer Neg Hx    Stomach cancer Neg Hx    Liver disease Neg Hx    Social History   Socioeconomic History   Marital status: Widowed    Spouse name: Not on file   Number of children: 3   Years of education: Not on file   Highest education level: Not on file  Occupational History   Occupation: retired    Comment: retired Licensed conveyancer  Tobacco Use   Smoking status: Former    Packs/day: 1.50    Years: 30.00    Total pack years: 45.00    Types: Cigarettes    Start date: 33    Quit date: 11/04/1991    Years since quitting: 30.9   Smokeless tobacco: Never   Tobacco comments:     Former smoker 03/14/22  Vaping Use   Vaping Use: Never used  Substance and Sexual Activity   Alcohol use: Yes    Alcohol/week: 7.0 standard drinks of alcohol    Types: 7 Glasses of wine per week    Comment: 1 glass of wine nightly 03/14/22   Drug use: No   Sexual activity: Not Currently  Other Topics Concern   Not on file  Social History Narrative   Lives with daughter   Social Determinants of Health   Financial Resource Strain: Not on file  Food Insecurity: Not on file  Transportation Needs: Not on file  Physical Activity: Not on file  Stress: Not on file  Social Connections: Not on file    Tobacco Counseling Counseling given: Not Answered Tobacco comments: Former smoker 03/14/22   Clinical Intake:  Pre-visit preparation completed: No  Pain : No/denies pain     BMI - recorded: 24.4 Nutritional Status: BMI of 19-24  Normal Nutritional Risks: None Diabetes: No  How often do you need to have someone help you when you read instructions, pamphlets, or other written materials from your doctor or pharmacy?: 1 - Never What is the last grade level you completed in school?: Master's Degree  Diabetic?No   Interpreter Needed?: No    Activities of Daily Living    09/24/2022    9:55 AM 02/18/2022    3:06 PM  In your present state of health, do you have any difficulty performing the following activities:  Hearing? 1 0  Comment wears hearing aids   Vision? 1 0  Comment wears eye glasses   Difficulty concentrating or making decisions? 1 0  Comment brain fog sometimes   Walking or climbing stairs? 0 0  Dressing or bathing? 0 0  Doing errands, shopping? 0 0  Preparing Food and eating ? Y   Comment daughter assist   Using the Toilet? N   In the past six months, have you accidently leaked urine? Y   Comment wears pulls   Do you have problems with loss of bowel control? Y   Comment constipation miralax effective   Managing your Medications? N   Managing your  Finances? N   Housekeeping or managing your Housekeeping? Y   Comment daughter assist     Patient Care Team: Vivian Neuwirth, Nelda Bucks, NP as PCP - General (Family Medicine) Debara Pickett Nadean Corwin, MD as PCP - Cardiology (Cardiology) Constance Haw, MD as PCP - Electrophysiology (Cardiology)  Indicate any recent Medical Services you may have received from other than [facility] providers in the past year (date may be approximate).     Assessment:   This is a routine wellness examination for [place].  Hearing/Vision screen Hearing Screening - Comments:: No Hearing concerns. Patient wears hearing aid in both ears. Vision Screening - Comments:: No vision concerns. Patient wears prescription glasses. Patient last eye exam October 2023.  Dietary issues and exercise activities discussed: Current Exercise Habits: Structured exercise class, Type of exercise: yoga, Time (Minutes): 30, Frequency (Times/Week): 5, Weekly Exercise (Minutes/Week): 150, Intensity: Moderate, Exercise limited by: None identified   Goals Addressed   None    Depression Screen    09/24/2022    9:27 AM  PHQ 2/9 Scores  PHQ - 2 Score 0    Fall Risk    09/24/2022    9:27 AM 09/22/2022    1:37 PM 05/26/2022   10:03 AM  Fall Risk   Falls in the past year? 0 0 0  Number falls in past yr: 0 0 0  Injury with Fall? 0 0 0  Risk for fall due to : No Fall Risks No Fall Risks No Fall Risks  Follow up Falls evaluation completed Falls evaluation completed Falls evaluation completed    [place]:  Any stairs in or around the home? Yes  If so, are there any without handrails? No  Home free of loose throw rugs in walkways, pet beds, electrical cords, etc? No  Adequate lighting in your home to reduce risk of falls? Yes   ASSISTIVE  DEVICES UTILIZED TO PREVENT FALLS:  Life alert? No  Use of a cane, walker or w/c? Yes  Grab bars in the bathroom? Yes  Shower chair or bench in shower? Yes  Elevated  toilet seat or a handicapped toilet? Yes   TIMED UP AND GO:  Was the test performed? No .  Length of time to ambulate 10 feet: N/A sec.   Gait slow and steady with assistive device  Cognitive Function:        09/24/2022    9:28 AM  6CIT Screen  What Year? 0 points  What month? 0 points  What time? 0 points  Count back from 20 0 points  Months in reverse 0 points  Repeat phrase 4 points  Total Score 4 points    Immunizations Immunization History  Administered Date(s) Administered   Influenza, High Dose Seasonal PF 08/03/2017, 07/15/2018, 06/28/2019, 08/03/2020   Influenza,inj,Quad PF,6+ Mos 08/02/2018   Influenza-Unspecified 08/27/2021, 08/01/2022   PFIZER(Purple Top)SARS-COV-2 Vaccination 12/08/2019, 01/02/2020, 10/02/2020, 03/04/2021   Pneumococcal Polysaccharide-23 01/27/2020   Zoster Recombinat (Shingrix) 12/25/2017, 03/04/2021    TDAP status: Due, Education has been provided regarding the importance of this vaccine. Advised may receive this vaccine at local pharmacy or Health Dept. Aware to provide a copy of the vaccination record if obtained from local pharmacy or Health Dept. Verbalized acceptance and understanding.  Flu Vaccine status: Up to date  Pneumococcal vaccine status: Due, Education has been provided regarding the importance of this vaccine. Advised may receive this vaccine at local pharmacy or Health Dept. Aware to provide a copy of the vaccination record if obtained from local pharmacy or Health Dept. Verbalized acceptance and understanding.  Covid-19 vaccine status: Information provided on how to obtain vaccines.   Qualifies for Shingles Vaccine? Yes   Zostavax completed Yes   Shingrix Completed?: Yes  Screening Tests Health Maintenance  Topic Date Due   Pneumonia Vaccine 41+ Years old (2 - PCV) 01/26/2021   COVID-19 Vaccine (5 - 2023-24 season) 07/04/2022   Medicare Annual Wellness (AWV)  09/25/2023   INFLUENZA VACCINE  Completed   DEXA SCAN   Completed   Zoster Vaccines- Shingrix  Completed   HPV VACCINES  Aged Out    Health Maintenance  Health Maintenance Due  Topic Date Due   Pneumonia Vaccine 74+ Years old (2 - PCV) 01/26/2021   COVID-19 Vaccine (5 - 2023-24 season) 07/04/2022    Colorectal cancer screening: No longer required.   Mammogram status: No longer required due to age.  Bone Density status: Completed 04/16/2021. Results reflect: Bone density results: OSTEOPENIA. Repeat every 2 years.  Lung Cancer Screening: (Low Dose CT Chest recommended if Age 9-80 years, 30 pack-year currently smoking OR have quit w/in 15years.) does not qualify.   Lung Cancer Screening Referral: No   Additional Screening:  Hepatitis C Screening: does not qualify; Completed No   Vision Screening: Recommended annual ophthalmology exams for early detection of glaucoma and other disorders of the eye. Is the patient up to date with their annual eye exam?  Yes  Who is the provider or what is the name of the office in which the patient attends annual eye exams? [facility]  If pt is not established with a provider, would they like to be referred to a provider to establish care? No .   Dental Screening: Recommended annual dental exams for proper oral hygiene  Community Resource Referral / Chronic Care Management: CRR required this visit?  No  CCM required this visit?  No      Plan:     I have personally reviewed and noted the following in the patient's chart:   Medical and social history Use of alcohol, tobacco or illicit drugs  Current medications and supplements including opioid prescriptions. Patient is not currently taking opioid prescriptions. Functional ability and status Nutritional status Physical activity Advanced directives List of other physicians Hospitalizations, surgeries, and ER visits in previous 12 months Vitals Screenings to include cognitive, depression, and falls Referrals and  appointments  In addition, I have reviewed and discussed with patient certain preventive protocols, quality metrics, and best practice recommendations. A written personalized care plan for preventive services as well as general preventive health recommendations were provided to patient.     Sandrea Hughs, NP   09/24/2022   Nurse Notes: Due for Pneumonia and COVID-19 vaccine

## 2022-10-16 IMAGING — CT NM PET TUM IMG INITIAL (PI) SKULL BASE T - THIGH
1 of 7 series · 1 of 25 positions shown · non-contrast
Comparison: Chest CT 02/25/2022.  Abdominopelvic CT 09/02/2021.

CLINICAL DATA: Initial treatment strategy for right upper lobe
pulmonary nodule on chest CT.

EXAM:
NUCLEAR MEDICINE PET SKULL BASE TO THIGH
TECHNIQUE: 6.7 mCi F-18 FDG was injected intravenously. Full-ring PET imaging
was performed from the skull base to thigh after the radiotracer. CT
data was obtained and used for attenuation correction and anatomic
localization.
Fasting blood glucose: 102 mg/dl

[Series 4: ct sk_thigh 5.0 br38 · axial · 5.0mm · 0.98mm/px · 1 of 224 slices shown]
[im 224/224  brain]
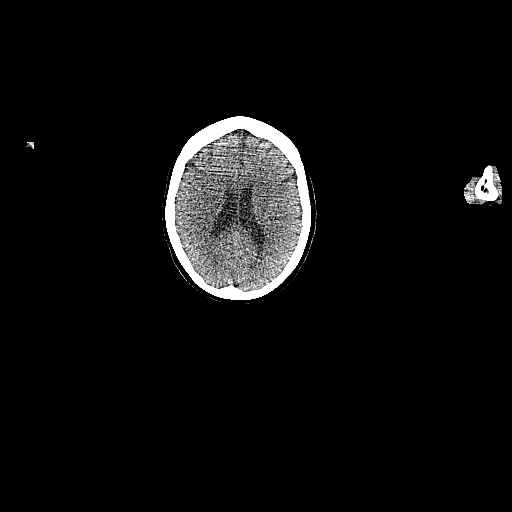

[1 of 25 positions shown; findings below may reference images not displayed]

FINDINGS: Mediastinal blood pool activity: SUV max

NECK:

No hypermetabolic cervical lymph nodes are identified.There are no
lesions of the pharyngeal mucosal space.

Incidental CT findings: Bilateral carotid atherosclerosis.

CHEST:

There are no hypermetabolic mediastinal, hilar or axillary lymph
nodes. The enlarging nodule of concern posteriorly in the right
upper lobe is hypermetabolic. This nodule measures 1.1 x 0.9 cm on
image [DATE] and has an SUV max of 4.3. No other hypermetabolic
pulmonary activity or suspicious nodularity.

Incidental CT findings: Diffuse atherosclerosis of the aorta, great
vessels and coronary arteries. Mild centrilobular emphysema and mild
scattered pulmonary scarring.

ABDOMEN/PELVIS:

There is no hypermetabolic activity within the liver, adrenal
glands, spleen or pancreas. There is no hypermetabolic nodal
activity.

Incidental CT findings: Large ventral hernia contains a portion of
the transverse colon. No evidence of incarceration or obstruction.
There is moderate stool throughout the colon. Diffuse aortic and
branch vessel atherosclerosis status post bilateral external iliac
stenting.

SKELETON:

There is no hypermetabolic activity to suggest osseous metastatic
disease.

Incidental CT findings: Mild spondylosis. Previous proximal right
humeral ORIF.
IMPRESSION: 1. The enlarging nodule of concern posteriorly in the right upper
lobe is hypermetabolic, consistent with bronchogenic carcinoma. No
evidence of thoracic nodal or distant metastatic disease. By this
examination, findings are consistent with stage IA disease (T1b N0
M0).
2. Ventral abdominal wall hernia containing transverse colon. No
evidence of incarceration or bowel obstruction.
3. Coronary and aortic atherosclerosis ([id number].M). Emphysema
([id number].N).

## 2022-10-21 ENCOUNTER — Encounter: Payer: Self-pay | Admitting: Internal Medicine

## 2022-10-21 NOTE — Telephone Encounter (Signed)
Sent for review

## 2022-10-22 MED ORDER — IRBESARTAN 150 MG PO TABS: 150.0000 mg | ORAL_TABLET | Freq: Every day | ORAL | 3 refills | Status: DC

## 2022-11-12 ENCOUNTER — Encounter: Payer: Self-pay | Admitting: Internal Medicine

## 2022-11-12 MED ORDER — IRBESARTAN 300 MG PO TABS: 300.0000 mg | ORAL_TABLET | Freq: Every day | ORAL | 1 refills | Status: DC

## 2022-11-12 NOTE — Telephone Encounter (Signed)
Spoke with patient who asked if she can take 2 of the irbesartan '150mg'$  together in the morning or split between AM and PM. Recommended that she take them together at a dose of '300mg'$ . She asked that the new prescription be sent to [facility]. Done. She will continue to keep BP diary.

## 2022-11-29 NOTE — Progress Notes (Unsigned)
Electrophysiology Office Note   Date:  12/01/2022   ID:  Holly Benson, DOB 1933/09/12, MRN [id number]  PCP:  Holly Hughs, NP  Cardiologist:  [facility] Primary Electrophysiologist:  Holly Shiner Meredith Leeds, MD    Chief Complaint: AF/flutter   History of Present Illness: Holly Benson is a 89 y.o. female who is being seen today for the evaluation of atrial flutter at the request of Benson, Holly C, NP. Presenting today for electrophysiology evaluation.  She has a history seen for hypertension, atrial fibrillation/flutter, hyperlipidemia, OSA on CPAP, COPD.  She was found to have changes on CT scan of the lungs and thus amiodarone was stopped.  He has since been loaded on dofetilide.  Today, denies symptoms of palpitations, chest pain, shortness of breath, orthopnea, PND, lower extremity edema, claudication, dizziness, presyncope, syncope, bleeding, or neurologic sequela. The patient is tolerating medications without difficulties.  Since being seen she has done well.  She has noted no further episodes of atrial fibrillation.  She has gained some weight.  She had a ventral hernia which was fixed and now her appetite has gotten better.  She feels that she needs to be on higher dose of Eliquis.    Past Medical History:  Diagnosis Date   Arrhythmia    Arthritis    Atrial fibrillation (HCC)    Constipation    chronic   COPD (chronic obstructive pulmonary disease) (HCC)    Hyperlipidemia    Hypertension    pulmonary   OSA on CPAP    Osteoarthritis    Osteoporosis    Peripheral neuropathy    Pulmonary hypertension (HCC)    RLS (restless legs syndrome)    Sleep apnea    Past Surgical History:  Procedure Laterality Date   ABDOMINAL AORTOGRAM W/LOWER EXTREMITY N/A 03/21/2020   Procedure: ABDOMINAL AORTOGRAM W/ Bilateral LOWER EXTREMITY [place];  Surgeon: Wellington Hampshire, MD;  Location: [facility];  Service: Cardiovascular;  Laterality: N/A;   ABDOMINAL HYSTERECTOMY  Bilateral    arm surgery Right    Broken arm and has a plate in it   CATARACT EXTRACTION Bilateral    COLONOSCOPY     More than 10 years ago In [place] ARTHROSCOPY Right    PERIPHERAL VASCULAR INTERVENTION Bilateral 03/21/2020   Procedure: PERIPHERAL VASCULAR INTERVENTION;  Surgeon: Wellington Hampshire, MD;  Location: [facility];  Service: Cardiovascular;  Laterality: Bilateral;  external iliac   TONSILLECTOMY       Current Outpatient Medications  Medication Sig Dispense Refill   acetaminophen (TYLENOL) 325 MG tablet Take 650 mg by mouth as needed for moderate pain or headache.     albuterol (VENTOLIN HFA) 108 (90 Base) MCG/ACT inhaler Inhale 2 puffs into the lungs every 6 (six) hours as needed for wheezing or shortness of breath. 18 g 6   amLODipine (NORVASC) 5 MG tablet Take 1 tablet (5 mg total) by mouth daily. 30 tablet 6   apixaban (ELIQUIS) 5 MG TABS tablet Take 1 tablet (5 mg total) by mouth 2 (two) times daily. 60 tablet 6   clonazePAM (KLONOPIN) 0.5 MG tablet Take 0.25 mg by mouth at bedtime as needed for anxiety.     diltiazem (TIAZAC) 240 MG 24 hr capsule Take 1 capsule by mouth daily.     docusate sodium (COLACE) 100 MG capsule Take 300 mg by mouth at bedtime.     dofetilide (TIKOSYN) 250 MCG capsule Take 1 capsule (250 mcg total) by  mouth 2 (two) times daily. 180 capsule 3   furosemide (LASIX) 20 MG tablet Take one tablet by mouth on Monday, Wednesday and Friday     irbesartan (AVAPRO) 300 MG tablet Take 1 tablet (300 mg total) by mouth daily. 90 tablet 1   Misc Natural Products (COLON CLEANSE) CAPS Take 4 capsules by mouth as needed (constipation).     OVER THE COUNTER MEDICATION Take 3 capsules by mouth daily. [place], Bone Strength     Polyethyl Glycol-Propyl Glycol (SYSTANE) 0.4-0.3 % SOLN Place 1 drop into both eyes as needed.     polyethylene glycol (MIRALAX / GLYCOLAX) 17 g packet Take 17 g by mouth 2 (two) times daily.     PRESCRIPTION  MEDICATION at bedtime. CPAP     rOPINIRole (REQUIP) 0.25 MG tablet Take 1 tablet in the morning and three tablets after dinner po 360 tablet 3   Tiotropium Bromide-Olodaterol (STIOLTO RESPIMAT) 2.5-2.5 MCG/ACT AERS USE 2 INHALATIONS ORALLY   EVERY MORNING 12 g 3   Vitamin D-Vitamin K (VITAMIN K2-VITAMIN D3 PO) Take 1 Capful by mouth daily.     No current facility-administered medications for this visit.   Facility-Administered Medications Ordered in Other Visits  Medication Dose Route Frequency Provider Last Rate Last Admin   technetium tetrofosmin (TC-MYOVIEW) injection 92.4 millicurie  26.8 millicurie Intravenous Once PRN Skeet Latch, MD        Allergies:   Lovenox [enoxaparin sodium] and Metoprolol succinate [metoprolol]   Social History:  The patient  reports that she quit smoking about 31 years ago. Her smoking use included cigarettes. She started smoking about 68 years ago. She has a 45.00 pack-year smoking history. She has never used smokeless tobacco. She reports current alcohol use of about 7.0 standard drinks of alcohol per week. She reports that she does not use drugs.   Family History:  The patient's family history includes Hypertension in her sister; Stroke in her maternal grandfather; Suicidality in her father.   ROS:  Please see the history of present illness.   Otherwise, review of systems is positive for none.   All other systems are reviewed and negative.   PHYSICAL EXAM: VS:  BP (!) 162/74   Pulse 75   Ht '5\' 2"'$  (1.575 m)   Wt 135 lb 3.2 oz (61.3 kg)   SpO2 96%   BMI 24.73 kg/m  , BMI Body mass index is 24.73 kg/m. GEN: Well nourished, well developed, in no acute distress  HEENT: normal  Neck: no JVD, carotid bruits, or masses Cardiac: RRR; no murmurs, rubs, or gallops,no edema  Respiratory:  clear to auscultation bilaterally, normal work of breathing GI: soft, nontender, nondistended, + BS MS: no deformity or atrophy  Skin: warm and dry Neuro:  Strength  and sensation are intact Psych: euthymic mood, full affect  EKG:  EKG is ordered today. Personal review of the ekg ordered shows sinus rhythm   Recent Labs: 03/10/2022: Pro B Natriuretic peptide (BNP) 210.0 05/26/2022: ALT 17; Hemoglobin 15.6; Platelets 309; TSH 2.09 07/09/2022: BUN 17; Creatinine, Ser 0.79; Magnesium 2.2; Potassium 4.5; Sodium 139    Lipid Panel     Component Value Date/Time   CHOL 231 (H) 05/26/2022 1046   CHOL 149 04/11/2020 0838   TRIG 98 05/26/2022 1046   HDL 86 05/26/2022 1046   HDL 69 04/11/2020 0838   CHOLHDL 2.7 05/26/2022 1046   LDLCALC 125 (H) 05/26/2022 1046     Wt Readings from Last 3 Encounters:  12/01/22  135 lb 3.2 oz (61.3 kg)  09/22/22 133 lb 6.4 oz (60.5 kg)  08/14/22 131 lb (59.4 kg)      Other studies Reviewed: Additional studies/ records that were reviewed today include: TTE 11/18/18  Review of the above records today demonstrates:  - Left ventricle: The cavity size was normal. Wall thickness was   increased in a pattern of mild LVH. Systolic function was normal.   The estimated ejection fraction was in the range of 60% to 65%.   Wall motion was normal; there were no regional wall motion   abnormalities. The study is not technically sufficient to allow   evaluation of LV diastolic function. - Mitral valve: Mildly thickened leaflets . There was trivial   regurgitation. - Left atrium: Moderately dilated. - Right ventricle: The cavity size was mildly dilated. - Right atrium: Moderately dilated. - Tricuspid valve: There was moderate regurgitation. - Pulmonary arteries: PA peak pressure: 51 mm Hg (S). - Inferior vena cava: The vessel was dilated. The respirophasic   diameter changes were blunted (< 50%), consistent with elevated   central venous pressure.  Cardiac monitor 01/22/2020 personally reviewed Max 96 bpm 09:03am, 03/18 Min 48 bpm 04:29am, 03/20 Avg 66 bpm Less than 1% PACs and PVCs No atrial fibrillation noted Predominant  rhythm was sinus rhythm Symptoms associated with sinus rhythm  ASSESSMENT AND PLAN:  1.  Persistent atrial fibrillation/flutter: Currently on Eliquis and metoprolol, dose as above.  CHA2DS2-VASc of 4.  Amiodarone was stopped due to an abnormal CT scan.  Currently on dofetilide, dose above.  Mains in sinus rhythm.  She has gained weight.  She Dallie Patton need a higher dose of Eliquis.  2.  Obstructive sleep apnea: CPAP compliance encouraged  3.  Hypertension: Blood pressure elevated today and has been elevated at home.  Jyles Sontag start Norvasc 5 mg daily.  She Aalyssa Elderkin talk to her primary physician and primary cardiologist about further changes.  4.  Second hypercoagulable state: Currently on Eliquis for atrial fibrillation as above  5.  High risk medication monitoring: Currently on dofetilide for atrial fibrillation.  QTc remained stable.  Pang Robers check labs below for monitoring.  The patient does not have concerns regarding her medicines.  The following changes were made today: Start norvasc, increase Eliquis  Labs/ tests ordered today include:  Orders Placed This Encounter  Procedures   Basic metabolic panel   CBC   Magnesium   EKG 12-Lead      Disposition:   FU 6 months  Signed, Joah Patlan Meredith Leeds, MD  12/01/2022 9:46 AM     [facility] [address] [place] [address] [phone] (office) [phone] (fax)

## 2022-12-01 ENCOUNTER — Ambulatory Visit: Payer: Medicare Other | Attending: Cardiology | Admitting: Cardiology

## 2022-12-01 ENCOUNTER — Encounter: Payer: Self-pay | Admitting: Cardiology

## 2022-12-01 VITALS — BP 162/74 | HR 75 | Ht 62.0 in | Wt 135.2 lb

## 2022-12-01 DIAGNOSIS — Z79899 Other long term (current) drug therapy: Secondary | ICD-10-CM | POA: Insufficient documentation

## 2022-12-01 DIAGNOSIS — D6869 Other thrombophilia: Secondary | ICD-10-CM

## 2022-12-01 DIAGNOSIS — I4819 Other persistent atrial fibrillation: Secondary | ICD-10-CM | POA: Diagnosis not present

## 2022-12-01 DIAGNOSIS — I10 Essential (primary) hypertension: Secondary | ICD-10-CM | POA: Insufficient documentation

## 2022-12-01 LAB — CBC
Hematocrit: 40.8 % (ref 34.0–46.6)
Hemoglobin: 14.2 g/dL (ref 11.1–15.9)
MCH: 33.3 pg — ABNORMAL HIGH (ref 26.6–33.0)
MCHC: 34.8 g/dL (ref 31.5–35.7)
MCV: 96 fL (ref 79–97)
Platelets: 293 10*3/uL (ref 150–450)
RBC: 4.26 x10E6/uL (ref 3.77–5.28)
RDW: 13 % (ref 11.7–15.4)
WBC: 5.7 10*3/uL (ref 3.4–10.8)

## 2022-12-01 LAB — BASIC METABOLIC PANEL
BUN/Creatinine Ratio: 28 (ref 12–28)
BUN: 19 mg/dL (ref 8–27)
CO2: 26 mmol/L (ref 20–29)
Calcium: 9.5 mg/dL (ref 8.7–10.3)
Chloride: 102 mmol/L (ref 96–106)
Creatinine, Ser: 0.69 mg/dL (ref 0.57–1.00)
Glucose: 107 mg/dL — ABNORMAL HIGH (ref 70–99)
Potassium: 4.6 mmol/L (ref 3.5–5.2)
Sodium: 138 mmol/L (ref 134–144)
eGFR: 84 mL/min/{1.73_m2} (ref >59)

## 2022-12-01 LAB — MAGNESIUM: Magnesium: 1.9 mg/dL (ref 1.6–2.3)

## 2022-12-01 MED ORDER — AMLODIPINE BESYLATE 5 MG PO TABS: 5.0000 mg | ORAL_TABLET | Freq: Every day | ORAL | 6 refills | Status: DC

## 2022-12-01 MED ORDER — APIXABAN 5 MG PO TABS: 5.0000 mg | ORAL_TABLET | Freq: Two times a day (BID) | ORAL | 6 refills | Status: DC

## 2022-12-01 NOTE — Patient Instructions (Addendum)
Medication Instructions:  Your physician has recommended you make the following change in your medication:  INCREASE Eliquis to 5 mg twice daily START Amlodipine (Norvasc) 5 mg once daily  *If you need a refill on your cardiac medications before your next appointment, please call your pharmacy*   Lab Work: [place] surveillance lab work today:  BMET, CBC & Magnesium level  If you have labs (blood work) drawn today and your tests are completely normal, you will receive your results only by: [employer] (if you have MyChart) OR A paper copy in the mail If you have any lab test that is abnormal or we need to change your treatment, we will call you to review the results.   Testing/Procedures: None ordered   Follow-Up: At [facility], you and your health needs are our priority.  As part of our continuing mission to provide you with exceptional heart care, we have created designated Provider Care Teams.  These Care Teams include your primary Cardiologist (physician) and Advanced Practice Providers (APPs -  Physician Assistants and Nurse Practitioners) who all work together to provide you with the care you need, when you need it.  We recommend signing up for the patient portal called "MyChart".  Sign up information is provided on this After Visit Summary.  MyChart is used to connect with patients for Virtual Visits (Telemedicine).  Patients are able to view lab/test results, encounter notes, upcoming appointments, etc.  Non-urgent messages can be sent to your provider as well.   To learn more about what you can do with MyChart, go to [email or web address].    Your next appointment:   6 month(s)  The format for your next appointment:   In Person  Provider:   You will see one of the following Advanced Practice Providers on your designated Care Team:   Tommye Standard, [place] Legrand Como "Jonni Sanger" Chalmers Cater, [place] or AFib clinic    Thank you for choosing [facility]!!   Trinidad Curet, RN [phone]  Other Instructions  Amlodipine Tablets What is this medication? AMLODIPINE (am LOE di peen) treats high blood pressure and prevents chest pain (angina). It works by relaxing the blood vessels, which helps decrease the amount of work your heart has to do. It belongs to a group of medications called calcium channel blockers. This medicine may be used for other purposes; ask your health care provider or pharmacist if you have questions. COMMON BRAND NAME(S): Norvasc What should I tell my care team before I take this medication? They need to know if you have any of these conditions: Heart disease Liver disease An unusual or allergic reaction to amlodipine, other medications, foods, dyes, or preservatives Pregnant or trying to get pregnant Breastfeeding How should I use this medication? Take this medication by mouth. Take it as directed on the prescription label at the same time every day. You can take it with or without food. If it upsets your stomach, take it with food. Keep taking it unless your care team tells you to stop. Talk to your care team about the use of this medication in children. While it may be prescribed for children as young as 6 for selected conditions, precautions do apply. Overdosage: If you think you have taken too much of this medicine contact a poison control center or emergency room at once. NOTE: This medicine is only for you. Do not share this medicine with others. What if I miss a dose? If you miss a dose, take it as  soon as you can. If it is almost time for your next dose, take only that dose. Do not take double or extra doses. What may interact with this medication? Clarithromycin Cyclosporine Diltiazem Itraconazole Simvastatin Tacrolimus This list may not describe all possible interactions. Give your health care provider a list of all the medicines, herbs, non-prescription drugs, or dietary supplements you use. Also tell them if you  smoke, drink alcohol, or use illegal drugs. Some items may interact with your medicine. What should I watch for while using this medication? Visit your care team for regular checks on your progress. Check your blood pressure as directed. Know what your blood pressure should be and when to contact your care team. Do not treat yourself for coughs, colds, or pain while you are using this medication without asking your care team for advice. Some medications may increase your blood pressure. This medication may affect your coordination, reaction time, or judgment. Do not drive or operate machinery until you know how this medication affects you. Sit up or stand slowly to reduce the risk of dizzy or fainting spells. Drinking alcohol with this medication can increase the risk of these side effects. What side effects may I notice from receiving this medication? Side effects that you should report to your care team as soon as possible: Allergic reactions--skin rash, itching, hives, swelling of the face, lips, tongue, or throat Heart attack--pain or tightness in the chest, shoulders, arms, or jaw, nausea, shortness of breath, cold or clammy skin, feeling faint or lightheaded Low blood pressure--dizziness, feeling faint or lightheaded, blurry vision Worsening chest pain (angina)--pain, pressure, or tightness in the chest, neck, back, or arms Side effects that usually do not require medical attention (report these to your care team if they continue or are bothersome): Facial flushing, redness Heart palpitations--rapid, pounding, or irregular heartbeat Nausea Stomach pain Swelling of the ankles, hands, or feet This list may not describe all possible side effects. Call your doctor for medical advice about side effects. You may report side effects to FDA at 1-800-FDA-1088. Where should I keep my medication? Keep out of the reach of children and pets. Store at room temperature between 20 and 25 degrees C (68 and  77 degrees F). Protect from light and moisture. Keep the container tightly closed. Get rid of any unused medication after the expiration date. To get rid of medications that are no longer needed or have expired: Take the medication to a medication take-back program. Check with your pharmacy or law enforcement to find a location. If you cannot return the medication, check the label or package insert to see if the medication should be thrown out in the garbage or flushed down the toilet. If you are not sure, ask your care team. If it is safe to put in the trash, empty the medication out of the container. Mix the medication with cat litter, dirt, coffee grounds, or other unwanted substance. Seal the mixture in a bag or container. Put it in the trash. NOTE: This sheet is a summary. It may not cover all possible information. If you have questions about this medicine, talk to your doctor, pharmacist, or health care provider.  2023 Elsevier/[employer] Standard (2022-05-12 00:00:00)

## 2022-12-02 ENCOUNTER — Encounter: Payer: Self-pay | Admitting: Internal Medicine

## 2022-12-02 MED ORDER — ATENOLOL 50 MG PO TABS: 50.0000 mg | ORAL_TABLET | Freq: Every day | ORAL | 2 refills | Status: DC

## 2022-12-02 NOTE — Telephone Encounter (Signed)
Patient advised to stop taking diltiazem and to start taking atenolol '50mg'$  daily. She already has the amlodipine prescription. Atenolol explained. Patient will check BP 2 hours after taking BP meds and keep a log. She asked for 30 tablets with 2 refills to make sure she can tolerate the drug. Prescription sent to her pharmacy.

## 2022-12-08 ENCOUNTER — Encounter: Payer: Self-pay | Admitting: Emergency Medicine

## 2022-12-08 NOTE — Telephone Encounter (Signed)
Mychart message sent by pt: Holly Benson [facility] (supporting Collene Gobble, MD)12 minutes ago (9:48 AM)    We have been following a spot on my long for about 18 - 24 months.  I have had an increasing shortness of breath for the last month. My next scan is scheduled for March 7. Do I need to schedule the scan sooner?  (I also have heart-related issues which could be causing/contributing to this shortness of breath.)  Please advise.     Dr. Lamonte Sakai, please advise.

## 2022-12-08 NOTE — Telephone Encounter (Signed)
The nodule is small and I don't think it would be causing her symptoms. If she is having trouble we should offer her an OV w APP or RB to trouble shoot

## 2022-12-09 ENCOUNTER — Other Ambulatory Visit: Payer: Self-pay

## 2022-12-09 MED ORDER — APIXABAN 5 MG PO TABS: 5.0000 mg | ORAL_TABLET | Freq: Two times a day (BID) | ORAL | 6 refills | Status: DC

## 2022-12-10 ENCOUNTER — Other Ambulatory Visit: Payer: Self-pay | Admitting: *Deleted

## 2022-12-10 ENCOUNTER — Encounter: Payer: Self-pay | Admitting: Cardiology

## 2022-12-10 MED ORDER — APIXABAN 5 MG PO TABS: 5.0000 mg | ORAL_TABLET | Freq: Two times a day (BID) | ORAL | 1 refills | Status: DC

## 2022-12-10 NOTE — Telephone Encounter (Signed)
Prescription refill request for Eliquis received. Indication: AF Last office visit: 12/01/22  Elliot Cousin MD Scr: 0.69 on 12/01/22 Age: 89 Weight: 61.3kg  Based on above findings Eliquis '5mg'$  twice daily is the appropriate dose.  Refill was sent in earlier for 30 day suplly and [facility] needs 90 day supply.   Rx resnt.

## 2022-12-11 ENCOUNTER — Encounter (HOSPITAL_COMMUNITY): Payer: Self-pay | Admitting: *Deleted

## 2022-12-16 ENCOUNTER — Ambulatory Visit (HOSPITAL_BASED_OUTPATIENT_CLINIC_OR_DEPARTMENT_OTHER)
Admission: RE | Admit: 2022-12-16 | Discharge: 2022-12-16 | Disposition: A | Discharge status: Home / Self Care | Payer: Medicare Other | Source: Ambulatory Visit | Attending: Cardiovascular Disease | Admitting: Cardiovascular Disease

## 2022-12-16 ENCOUNTER — Ambulatory Visit (HOSPITAL_COMMUNITY)
Admission: RE | Admit: 2022-12-16 | Discharge: 2022-12-16 | Disposition: A | Discharge status: Home / Self Care | Payer: Medicare Other | Source: Ambulatory Visit | Attending: Cardiovascular Disease | Admitting: Cardiovascular Disease

## 2022-12-16 DIAGNOSIS — Z95828 Presence of other vascular implants and grafts: Secondary | ICD-10-CM | POA: Insufficient documentation

## 2022-12-16 DIAGNOSIS — I739 Peripheral vascular disease, unspecified: Secondary | ICD-10-CM

## 2022-12-16 LAB — VAS US ABI WITH/WO TBI
Left ABI: 0.95
Right ABI: 0.96

## 2022-12-19 ENCOUNTER — Other Ambulatory Visit: Payer: Self-pay | Admitting: *Deleted

## 2022-12-19 DIAGNOSIS — I739 Peripheral vascular disease, unspecified: Secondary | ICD-10-CM

## 2022-12-31 NOTE — Progress Notes (Unsigned)
Cardiology Clinic Note   Patient Name: Holly Benson Date of Encounter: 01/01/2023  Primary Care Provider:  Sandrea Hughs, NP Primary Cardiologist:  Pixie Casino, MD  Patient Profile    Holly Benson 89 year old female presents the clinic today for follow-up evaluation of her essential hypertension and atrial fibrillation.  Past Medical History    Past Medical History:  Diagnosis Date   Arrhythmia    Arthritis    Atrial fibrillation (HCC)    Constipation    chronic   COPD (chronic obstructive pulmonary disease) (HCC)    Hyperlipidemia    Hypertension    pulmonary   OSA on CPAP    Osteoarthritis    Osteoporosis    Peripheral neuropathy    Pulmonary hypertension (HCC)    RLS (restless legs syndrome)    Sleep apnea    Past Surgical History:  Procedure Laterality Date   ABDOMINAL AORTOGRAM W/LOWER EXTREMITY N/A 03/21/2020   Procedure: ABDOMINAL AORTOGRAM W/ Bilateral LOWER EXTREMITY [place];  Surgeon: Wellington Hampshire, MD;  Location: [facility];  Service: Cardiovascular;  Laterality: N/A;   ABDOMINAL HYSTERECTOMY Bilateral    arm surgery Right    Broken arm and has a plate in it   CATARACT EXTRACTION Bilateral    COLONOSCOPY     More than 10 years ago In [place] ARTHROSCOPY Right    PERIPHERAL VASCULAR INTERVENTION Bilateral 03/21/2020   Procedure: PERIPHERAL VASCULAR INTERVENTION;  Surgeon: Wellington Hampshire, MD;  Location: [facility];  Service: Cardiovascular;  Laterality: Bilateral;  external iliac   TONSILLECTOMY      Allergies  Allergies  Allergen Reactions   Lovenox [Enoxaparin Sodium] Hives and Rash    PT STATES SHE BROKE OUT IN A RASH HEAD TO TOE AND LASTED ABOUT 3 WEEKS    Metoprolol Succinate [Metoprolol] Other (See Comments)    Muscle aches, hand pain, and tingling    History of Present Illness    Holly Benson has a PMH of hypertension, atrial flutter, hyperlipidemia, obstructive sleep apnea, COPD,  peripheral arterial disease, and atrial fibrillation.  CHA2DS2-VASc score 5 on Eliquis.   She was  seen by Malka So, PA-C on 05/17/2021.  Of note she was previously on amiodarone which was discontinued due to lung toxicity.  When she was seen in the atrial fibrillation clinic she described a frequent fluttering type sensation which would last for 10 to 15 minutes and happen multiple times per day.  She was started on diltiazem 30 mg daily.   She contacted the nurse triage line 07/11/2021.  Her metoprolol was transitioned to carvedilol.  With metoprolol she did notice tingling and numbness with an icy type pain in her thumb and first 2 fingers on each hand.   She presented to the clinic 07/15/2021 for follow-up evaluation stated she was out of town for several days and noticed that her blood pressure was low.  She reported that she stopped taking her afternoon dose of hydralazine and did not notice much change with her blood pressure.  She discontinued her morning dose of hydralazine as well.  She noticed a pain in her hands and tingling with metoprolol.  She also noticed some occasional ankle edema.  I changed her metoprolol to carvedilol, gave her the [place] support stocking sheet, discontinued her hydralazine, gave her the salty 6 diet sheet, asked her to maintain her physical activity, and planned follow-up in 1 to 2 months.  Have asked her  to maintain a blood pressure log.   She reported that she contacted the office earlier last week for follow-up appointment.  She was told that she would not be scheduled for an appointment with Dr. Debara Pickett for several months.  She contacted the patient advocate representatives with her complaint.  She was added onto my schedule today.   She followed up with [name] PA-C on 08/06/2021.  She had contacted the office with reports of increased palpitations.  She questioned if she was having more episodes of atrial fibrillation.  She presented to the clinic alone.  She  reported that she did not want to distinguish between PACs and paroxysmal atrial fibrillation.  She noted palpitations at least once daily that would last for around 30 minutes.  She denied dizziness, presyncope, syncope with the episodes.  She had not taken her as needed Cardizem.  She was maintaining a blood pressure log.  Her systolic blood pressures were in the 80- 100s.  She denied bleeding issues.  She was tolerating carvedilol well.   She presented to the clinic 10/08/2021 for follow-up evaluation stated over the last week she had noticed increased shortness of breath and had has been taking her diltiazem more regularly due to increased heart rate.  Her blood pressures had been slightly elevated.  In  clinic her heart rate was 88 with a blood pressure of 116/60.  She reported that she  had some increased phlegm and has just returned from seeing her family.  She had a call from her pharmacist who lowered her apixaban dosing due to her weight.  She had regained weight.  I  increased her apixaban dosing back to 5 mg twice daily.  I will ordered an echocardiogram, CBC, and scheduled follow-up for February.   She was seen by [name], PA-C 10/10/2021.  During that time she reported that she was doing okay overall.  She noted shortness of breath and fatigue x10 days.  It was felt that it was likely related to her atrial fibrillation.  Her heart rates at home were 80-1 100s.  Her weight was slightly increased and she noted more ankle swelling.  She was prescribed furosemide 20 mg as needed for lower extremity swelling.   She was seen by Dr. Arbie Cookey 10/22/2021.  At that time she was interested in starting Tikosyn in February because she was planning a trip to [place] for the month of January.  Her weight remains stable and her heart rate was rate controlled.   She presented to the clinic 12/12/21 for follow-up evaluation and states she had a nice trip to [facility].  She reported that someone told her to stop  taking her diltiazem.  She had not taken the medication in around 1 and half months.  She reported that her heart rate had been 90s and low 100s.  She felt that her shortness of breath was stable.  We reviewed the medication and recommendations for starting Tikosyn.  I have asked her to follow-up with the atrial fibrillation clinic, I prescribed 60 mg of extended release diltiazem, and plan follow-up for 3 to 4 months.  She was noted to have changes on CT scan noted in her lungs.  Her amiodarone was stopped.  She underwent load of dofetilide.  She followed up with Dr. Curt Bears 12/01/2022.  She denied chest pain and shortness of breath.  She denied dizziness presyncope and syncope.  She was tolerating medications well.  She denied further episodes of atrial fibrillation.  She underwent repair of ventral hernia and reported that her appetite was getting better.  Chest Vascor was noted to be 4.  She was continued on apixaban, metoprolol and follow-up was planned for 6 months.  She presents to the clinic today for follow-up evaluation and states she has been noticing weakness, muscle aches, and shortness of breath over the last 6 weeks.  Her blood pressures have been elevated in the morning and stabilized throughout the day.  We reviewed her previous echocardiogram and last clinic visit.  She expressed understanding.  She feels that she has recovered fairly well since having her hernia repair.  However, she has not been able to return to her baseline physical activity since her surgery.  She has been eating better and her weight is slightly increased.  I will order a CBC and echocardiogram.  Will plan follow-up in 1 month - 2 months.  She plans to travel to [place] to see a glass blowing exhibit in the near future.    Today she denies chest pain, lower extremity edema,  melena, hematuria, hemoptysis, diaphoresis, weakness, presyncope, syncope, orthopnea, and PND.     Home Medications    Prior to Admission  medications   Medication Sig Start Date End Date Taking? Authorizing Provider  acetaminophen (TYLENOL) 325 MG tablet Take 650 mg by mouth as needed for moderate pain or headache.    [provider]  albuterol (VENTOLIN HFA) 108 (90 Base) MCG/ACT inhaler Inhale 2 puffs into the lungs every 6 (six) hours as needed for wheezing or shortness of breath. 03/10/22   Parrett, Fonnie Mu, NP  amLODipine (NORVASC) 5 MG tablet Take 1 tablet (5 mg total) by mouth daily. 12/01/22   Camnitz, Ocie Doyne, MD  apixaban (ELIQUIS) 5 MG TABS tablet Take 1 tablet (5 mg total) by mouth 2 (two) times daily. 12/10/22   Camnitz, Ocie Doyne, MD  atenolol (TENORMIN) 50 MG tablet Take 1 tablet (50 mg total) by mouth daily. 12/02/22   Hilty, Nadean Corwin, MD  clonazePAM (KLONOPIN) 0.5 MG tablet Take 0.25 mg by mouth at bedtime as needed for anxiety.    [provider]  docusate sodium (COLACE) 100 MG capsule Take 300 mg by mouth at bedtime.    [provider]  dofetilide (TIKOSYN) 250 MCG capsule Take 1 capsule (250 mcg total) by mouth 2 (two) times daily. 08/14/22   Baldwin Jamaica, PA-C  furosemide (LASIX) 20 MG tablet Take one tablet by mouth on Monday, Wednesday and Friday 02/28/22   Fenton, Clint R, PA  irbesartan (AVAPRO) 300 MG tablet Take 1 tablet (300 mg total) by mouth daily. 11/12/22   Hilty, Nadean Corwin, MD  Misc Natural Products (COLON CLEANSE) CAPS Take 4 capsules by mouth as needed (constipation).    [provider]  OVER THE COUNTER MEDICATION Take 3 capsules by mouth daily. [place], Bone Strength    [provider]  Polyethyl Glycol-Propyl Glycol (SYSTANE) 0.4-0.3 % SOLN Place 1 drop into both eyes as needed.    [provider]  polyethylene glycol (MIRALAX / GLYCOLAX) 17 g packet Take 17 g by mouth 2 (two) times daily.    [provider]  PRESCRIPTION MEDICATION at bedtime. CPAP    [provider]  rOPINIRole (REQUIP) 0.25 MG tablet Take 1 tablet  in the morning and three tablets after dinner po 02/13/22   Dohmeier, Asencion Partridge, MD  Tiotropium Bromide-Olodaterol (STIOLTO RESPIMAT) 2.5-2.5 MCG/ACT AERS USE 2 INHALATIONS ORALLY   EVERY MORNING  05/30/22   Collene Gobble, MD  Vitamin D-Vitamin K (VITAMIN K2-VITAMIN D3 PO) Take 1 Capful by mouth daily.    [provider]    Family History    Family History  Problem Relation Age of Onset   Suicidality Father    Stroke Maternal Grandfather    Hypertension Sister    Colon cancer Neg Hx    Esophageal cancer Neg Hx    Pancreatic cancer Neg Hx    Stomach cancer Neg Hx    Liver disease Neg Hx    She indicated that her mother is deceased. She indicated that her father is deceased. She indicated that her sister is alive. She indicated that her maternal grandfather is deceased. She indicated that the status of her neg hx is unknown.  Social History    Social History   Socioeconomic History   Marital status: Widowed    Spouse name: Not on file   Number of children: 3   Years of education: Not on file   Highest education level: Not on file  Occupational History   Occupation: retired    Comment: retired Licensed conveyancer  Tobacco Use   Smoking status: Former    Packs/day: 1.50    Years: 30.00    Total pack years: 45.00    Types: Cigarettes    Start date: 31    Quit date: 11/04/1991    Years since quitting: 31.1   Smokeless tobacco: Never   Tobacco comments:    Former smoker 03/14/22  Vaping Use   Vaping Use: Never used  Substance and Sexual Activity   Alcohol use: Yes    Alcohol/week: 7.0 standard drinks of alcohol    Types: 7 Glasses of wine per week    Comment: 1 glass of wine nightly 03/14/22   Drug use: No   Sexual activity: Not Currently  Other Topics Concern   Not on file  Social History Narrative   Lives with daughter   Social Determinants of Health   Financial Resource Strain: Not on file  Food Insecurity: Not on file  Transportation Needs: Not on file   Physical Activity: Not on file  Stress: Not on file  Social Connections: Not on file  Intimate Partner Violence: Not on file     Review of Systems    General:  No chills, fever, night sweats or weight changes.  Cardiovascular:  No chest pain, dyspnea on exertion, edema, orthopnea, palpitations, paroxysmal nocturnal dyspnea. Dermatological: No rash, lesions/masses Respiratory: No cough, dyspnea Urologic: No hematuria, dysuria Abdominal:   No nausea, vomiting, diarrhea, bright red blood per rectum, melena, or hematemesis Neurologic:  No visual changes, wkns, changes in mental status. All other systems reviewed and are otherwise negative except as noted above.  Physical Exam    VS:  BP 134/64   Pulse 67   Ht '5\' 2"'$  (1.575 m)   Wt 142 lb (64.4 kg)   SpO2 96%   BMI 25.97 kg/m  , BMI Body mass index is 25.97 kg/m. GEN: Well nourished, well developed, in no acute distress. HEENT: normal. Neck: Supple, no JVD, carotid bruits, or masses. Cardiac: RRR, no murmurs, rubs, or gallops. No clubbing, cyanosis, edema.  Radials/DP/PT 2+ and equal bilaterally.  Respiratory:  Respirations regular and unlabored, clear to auscultation bilaterally.  Increased shortness of breath with increased physical activity GI: Soft, nontender, nondistended, BS + x 4. MS: no deformity or atrophy.  Lower extremity weakness Skin: warm and dry, no rash. Neuro:  Strength  and sensation are intact. Psych: Normal affect.  Accessory Clinical Findings    Recent Labs: 03/10/2022: Pro B Natriuretic peptide (BNP) 210.0 05/26/2022: ALT 17; TSH 2.09 12/01/2022: BUN 19; Creatinine, Ser 0.69; Hemoglobin 14.2; Magnesium 1.9; Platelets 293; Potassium 4.6; Sodium 138   Recent Lipid Panel    Component Value Date/Time   CHOL 231 (H) 05/26/2022 1046   CHOL 149 04/11/2020 0838   TRIG 98 05/26/2022 1046   HDL 86 05/26/2022 1046   HDL 69 04/11/2020 0838   CHOLHDL 2.7 05/26/2022 1046   LDLCALC 125 (H) 05/26/2022 1046          ECG personally reviewed by me today-none today.  Nuclear stress testing 04/04/2022    The study is normal. Findings are consistent with no prior ischemia and no prior myocardial infarction. The study is low risk.   No ST deviation was noted.   LV perfusion is normal. There is no evidence of ischemia. There is no evidence of infarction.   Left ventricular function is normal. Nuclear stress EF: 64 %. The left ventricular ejection fraction is normal (55-65%). End diastolic cavity size is normal. End systolic cavity size is normal.   Prior study available for comparison from 12/08/2018.  Echocardiogram 04/17/2022  IMPRESSIONS     1. Global longitudinal strain is -17.1% Improved from echo in Dec 2022.  Left ventricular ejection fraction, by estimation, is 65 to 70%. The left  ventricle has normal function. The left ventricle has no regional wall  motion abnormalities. Left  ventricular diastolic parameters were normal.   2. Right ventricular systolic function is mildly reduced. The right  ventricular size is normal. There is moderately elevated pulmonary artery  systolic pressure.   3. Right atrial size was severely dilated.   4. The mitral valve is normal in structure. Mild mitral valve  regurgitation.   5. Tricuspid valve regurgitation is mild to moderate.   6. The aortic valve is tricuspid. Aortic valve regurgitation is mild.  Aortic valve sclerosis is present, with no evidence of aortic valve  stenosis.   FINDINGS   Left Ventricle: Global longitudinal strain is -17.1% Improved from echo  in Dec 2022. Left ventricular ejection fraction, by estimation, is 65 to  70%. The left ventricle has normal function. The left ventricle has no  regional wall motion abnormalities.  The left ventricular internal cavity size was normal in size. There is no  left ventricular hypertrophy. Left ventricular diastolic parameters were  normal.   Right Ventricle: The right ventricular size is normal.  Right vetricular  wall thickness was not assessed. Right ventricular systolic function is  mildly reduced. There is moderately elevated pulmonary artery systolic  pressure. The tricuspid regurgitant  velocity is 3.14 m/s, and with an assumed right atrial pressure of 8 mmHg,  the estimated right ventricular systolic pressure is 123XX123 mmHg.   Left Atrium: Left atrial size was normal in size.   Right Atrium: Right atrial size was severely dilated.   Pericardium: There is no evidence of pericardial effusion.   Mitral Valve: The mitral valve is normal in structure. Mild mitral valve  regurgitation.   Tricuspid Valve: The tricuspid valve is normal in structure. Tricuspid  valve regurgitation is mild to moderate.   Aortic Valve: The aortic valve is tricuspid. Aortic valve regurgitation is  mild. Aortic regurgitation PHT measures 612 msec. Aortic valve sclerosis  is present, with no evidence of aortic valve stenosis.   Pulmonic Valve: The pulmonic valve was normal in structure. Pulmonic  valve  regurgitation is not visualized.   Aorta: The aortic root and ascending aorta are structurally normal, with  no evidence of dilitation.   IAS/Shunts: No atrial level shunt detected by color flow Doppler.     Assessment & Plan   1.  Paroxysmal atrial fibrillation-heart rate 67 bpm.  Regular.  Denies episodes of accelerated or irregular heartbeat.   CHA2DS2-VASc score 4.  She denies bleeding issues.  Weight stable.   Continue Tikosyn, atenolol  continue apixaban to 5 mg twice daily Avoid triggers-reviewed Follows with A-fib clinic, EP   Essential hypertension-BP today 134/64.  Reports elevated blood pressures in the morning.  Has been checking her blood pressure about 2 hours after taking morning medication.   Continue atenolol, irbesartan, furosemide Increase amlodipine to 7.5 mg daily Heart healthy low-sodium diet Increase physical activity as tolerated   Cardiomyopathy-reports increased  shortness of breath and lower extremity weakness over the past 6 weeks.  Echocardiogram 04/17/2022 showed EF of 65-70%, moderately elevated pulmonary artery pressures, severely dilated right atria, mild mitral valve regurgitation and mild aortic valve regurgitation.  Stress testing 04/04/2022 showed no ischemia and low risk. Repeat echocardiogram Order CBC  Obstructive sleep apnea-reports continued compliance with CPAP.  Waking up well rested. Continue CPAP use   Disposition: Follow-up with Dr. Debara Pickett in 3-4 and A. fib clinic as scheduled.   Jossie Ng. Yadir Zentner NP-C     01/01/2023, 3:59 PM [place] [address] Office [phone] Fax [phone]    I spent 14 minutes examining this patient, reviewing medications, and using patient centered shared decision making involving her cardiac care.  Prior to her visit I spent greater than 20 minutes reviewing her past medical history,  medications, and prior cardiac tests.

## 2023-01-01 ENCOUNTER — Encounter: Payer: Self-pay | Admitting: General Practice

## 2023-01-01 ENCOUNTER — Ambulatory Visit: Payer: Medicare Other | Attending: General Practice | Admitting: General Practice

## 2023-01-01 VITALS — BP 134/64 | HR 67 | Ht 62.0 in | Wt 142.0 lb

## 2023-01-01 DIAGNOSIS — M6281 Muscle weakness (generalized): Secondary | ICD-10-CM

## 2023-01-01 DIAGNOSIS — I428 Other cardiomyopathies: Secondary | ICD-10-CM | POA: Diagnosis not present

## 2023-01-01 DIAGNOSIS — R0602 Shortness of breath: Secondary | ICD-10-CM | POA: Diagnosis not present

## 2023-01-01 DIAGNOSIS — G4733 Obstructive sleep apnea (adult) (pediatric): Secondary | ICD-10-CM

## 2023-01-01 DIAGNOSIS — I480 Paroxysmal atrial fibrillation: Secondary | ICD-10-CM | POA: Diagnosis not present

## 2023-01-01 DIAGNOSIS — I10 Essential (primary) hypertension: Secondary | ICD-10-CM | POA: Diagnosis not present

## 2023-01-01 MED ORDER — AMLODIPINE BESYLATE 5 MG PO TABS: 7.5000 mg | ORAL_TABLET | Freq: Every day | ORAL | 6 refills | Status: DC

## 2023-01-01 NOTE — Patient Instructions (Signed)
Medication Instructions:  INCREASE AMLODIPINE 7.'5MG'$  DAILY *If you need a refill on your cardiac medications before your next appointment, please call your pharmacy*  Lab Work: CBC TODAY If you have labs (blood work) drawn today and your tests are completely normal, you will receive your results only by: [place] (if you have MyChart) OR A paper copy in the mail If you have any lab test that is abnormal or we need to change your treatment, we will call you to review the results.  Testing/Procedures: Echocardiogram - Your physician has requested that you have an echocardiogram. Echocardiography is a painless test that uses sound waves to create images of your heart. It provides your doctor with information about the size and shape of your heart and how well your heart's chambers and valves are working. This procedure takes approximately one hour. There are no restrictions for this procedure.   Other Instructions INCREASE PHYSICAL ACTIVITY-AS TOLERATED  Follow-Up: At [facility], you and your health needs are our priority.  As part of our continuing mission to provide you with exceptional heart care, we have created designated Provider Care Teams.  These Care Teams include your primary Cardiologist (physician) and Advanced Practice Providers (APPs -  Physician Assistants and Nurse Practitioners) who all work together to provide you with the care you need, when you need it.  We recommend signing up for the patient portal called "MyChart".  Sign up information is provided on this After Visit Summary.  MyChart is used to connect with patients for Virtual Visits (Telemedicine).  Patients are able to view lab/test results, encounter notes, upcoming appointments, etc.  Non-urgent messages can be sent to your provider as well.   To learn more about what you can do with MyChart, go to [email or web address].    Your next appointment:   AFTER ECHO   Provider:   Pixie Casino, MD

## 2023-01-02 LAB — CBC
Hematocrit: 39 % (ref 34.0–46.6)
Hemoglobin: 13.4 g/dL (ref 11.1–15.9)
MCH: 33.3 pg — ABNORMAL HIGH (ref 26.6–33.0)
MCHC: 34.4 g/dL (ref 31.5–35.7)
MCV: 97 fL (ref 79–97)
Platelets: 306 10*3/uL (ref 150–450)
RBC: 4.03 x10E6/uL (ref 3.77–5.28)
RDW: 12.4 % (ref 11.7–15.4)
WBC: 6.8 10*3/uL (ref 3.4–10.8)

## 2023-01-08 ENCOUNTER — Ambulatory Visit (HOSPITAL_BASED_OUTPATIENT_CLINIC_OR_DEPARTMENT_OTHER)
Admission: RE | Admit: 2023-01-08 | Discharge: 2023-01-08 | Disposition: A | Discharge status: Home / Self Care | Payer: Medicare Other | Source: Ambulatory Visit | Attending: Family | Admitting: Family

## 2023-01-08 ENCOUNTER — Encounter (HOSPITAL_BASED_OUTPATIENT_CLINIC_OR_DEPARTMENT_OTHER): Payer: Self-pay

## 2023-01-08 DIAGNOSIS — R918 Other nonspecific abnormal finding of lung field: Secondary | ICD-10-CM | POA: Diagnosis not present

## 2023-01-08 DIAGNOSIS — R9389 Abnormal findings on diagnostic imaging of other specified body structures: Secondary | ICD-10-CM | POA: Diagnosis not present

## 2023-01-08 DIAGNOSIS — J9809 Other diseases of bronchus, not elsewhere classified: Secondary | ICD-10-CM | POA: Diagnosis not present

## 2023-01-08 DIAGNOSIS — J439 Emphysema, unspecified: Secondary | ICD-10-CM | POA: Diagnosis not present

## 2023-01-14 DIAGNOSIS — Z961 Presence of intraocular lens: Secondary | ICD-10-CM | POA: Diagnosis not present

## 2023-01-14 DIAGNOSIS — H04123 Dry eye syndrome of bilateral lacrimal glands: Secondary | ICD-10-CM | POA: Diagnosis not present

## 2023-01-14 DIAGNOSIS — H353131 Nonexudative age-related macular degeneration, bilateral, early dry stage: Secondary | ICD-10-CM | POA: Diagnosis not present

## 2023-01-14 DIAGNOSIS — H02889 Meibomian gland dysfunction of unspecified eye, unspecified eyelid: Secondary | ICD-10-CM | POA: Diagnosis not present

## 2023-01-14 DIAGNOSIS — H1849 Other corneal degeneration: Secondary | ICD-10-CM | POA: Diagnosis not present

## 2023-01-20 ENCOUNTER — Encounter: Payer: Self-pay | Admitting: Emergency Medicine

## 2023-01-20 ENCOUNTER — Ambulatory Visit (INDEPENDENT_AMBULATORY_CARE_PROVIDER_SITE_OTHER): Payer: Medicare Other | Admitting: Emergency Medicine

## 2023-01-20 VITALS — BP 120/72 | HR 58 | Temp 97.9°F | Ht 62.0 in | Wt 138.8 lb

## 2023-01-20 DIAGNOSIS — J449 Chronic obstructive pulmonary disease, unspecified: Secondary | ICD-10-CM

## 2023-01-20 DIAGNOSIS — G4731 Primary central sleep apnea: Secondary | ICD-10-CM

## 2023-01-20 DIAGNOSIS — R9389 Abnormal findings on diagnostic imaging of other specified body structures: Secondary | ICD-10-CM

## 2023-01-20 NOTE — Assessment & Plan Note (Signed)
Right upper lobe pulmonary nodule remained stable in size.  Suspect that this is likely a slow-growing non-small cell lung cancer.  For now we have decided to be conservative, continue to follow.  She would be open to possible navigational bronchoscopy and biopsy if there is an interval change.  Next CT chest will be in September.

## 2023-01-20 NOTE — Assessment & Plan Note (Signed)
Mild obstruction on PFT.  She has had progressive exertional dyspnea.  10 pounds weight gain but unclear whether this is relevant contributor.  She has seen cardiology and they are planning for repeat echocardiogram.  She is on good bronchodilator therapy so unclear to me that we need to escalate.  If her cardiology evaluation is unrevealing then we will consider repeating her PFT, changing her bronchodilators

## 2023-01-20 NOTE — Addendum Note (Signed)
Addended by: Gavin Potters R on: 01/20/2023 12:05 PM   Modules accepted: Orders

## 2023-01-20 NOTE — Patient Instructions (Signed)
We reviewed your CT scan of the chest today.  Your pulmonary nodule is stable compared with your most recent priors. We will plan to repeat your CT scan of the chest without contrast in September 2024 Please continue your Stiolto 2 puffs once daily. Keep your albuterol available to use 2 puffs when needed for shortness of breath, chest tightness, wheezing. Agree with repeat echocardiogram and cardiology follow-up as planned. Depending on how your cardiology evaluation goes we may decide to repeat your pulmonary function testing, further evaluate for any changes in lung disease Follow Dr. Lamonte Sakai in September after your CT scan or sooner if you have problems.

## 2023-01-20 NOTE — Progress Notes (Signed)
Subjective:    Patient ID: Holly Benson, female    DOB: Dec 02, 1932, 89 y.o.   MRN: [id number]  COPD She complains of cough and shortness of breath. There is no wheezing. Pertinent negatives include no ear pain, fever, headaches, postnasal drip, rhinorrhea, sneezing, sore throat or trouble swallowing. Her past medical history is significant for COPD.    ROV 01/20/2023 --Holly Benson is 89 with a history of severe COPD on Stiolto.  Also with atrial fibrillation on dofetilide, GERD, OSA/OHS for which she uses a Servo vent.  She has chronic cough with chronic rhinitis and GERD.  We have also been following a 1.3 cm right upper lobe pulmonary nodule, hypermetabolic on PET scan and most recently imaged 07/12/2022.  Most consistent with a slow-growing non-small cell lung cancer.  We have been conservative, have been following with serial imaging.  She had a repeat CT chest 01/08/2023 as below.  She notes that she has had a significant change in her breathing and exertional tolerance over the last 6 months.  She remains on Stiolto. She has tried albuterol, may help her some but not completely. No desaturations noted.   CT scan of the chest 01/08/2023 reviewed by me, shows that again the right upper lobe posterior pulmonary nodule measures 1.2 x 1.0 cm, overall stable.  No other nodules noted.   Review of Systems  Constitutional:  Negative for fever and unexpected weight change.  HENT:  Negative for congestion, dental problem, ear pain, nosebleeds, postnasal drip, rhinorrhea, sinus pressure, sneezing, sore throat and trouble swallowing.   Eyes:  Negative for redness and itching.  Respiratory:  Positive for cough and shortness of breath. Negative for chest tightness and wheezing.   Cardiovascular:  Negative for palpitations and leg swelling.  Gastrointestinal:  Negative for nausea and vomiting.  Genitourinary:  Negative for dysuria.  Musculoskeletal:  Negative for joint swelling.  Skin:  Negative for rash.   Neurological:  Negative for headaches.  Hematological:  Does not bruise/bleed easily.  Psychiatric/Behavioral:  Negative for dysphoric mood. The patient is not nervous/anxious.        Objective:   Physical Exam Vitals:   01/20/23 1114  BP: 120/72  Pulse: (!) 58  Temp: 97.9 F (36.6 C)  TempSrc: Oral  SpO2: 98%  Weight: 138 lb 12.8 oz (63 kg)  Height: 5\' 2"  (1.575 m)   Gen: Pleasant, thin woman, in no distress,  normal affect, mild kyphosis  ENT: No lesions,  mouth clear,  oropharynx clear, no postnasal drip  Neck: No JVD, no stridor  Lungs: No use of accessory muscles, few bibasilar inspiratory crackles  Cardiovascular: RRR, heart sounds normal, no murmur or gallops, no peripheral edema  Musculoskeletal: joints of hands swollen, nodular  Neuro: alert, non focal  Skin: Warm, no lesions or rashes      Assessment & Plan:  Abnormal CT of the chest Right upper lobe pulmonary nodule remained stable in size.  Suspect that this is likely a slow-growing non-small cell lung cancer.  For now we have decided to be conservative, continue to follow.  She would be open to possible navigational bronchoscopy and biopsy if there is an interval change.  Next CT chest will be in September.  COPD (chronic obstructive pulmonary disease) (HCC) Mild obstruction on PFT.  She has had progressive exertional dyspnea.  10 pounds weight gain but unclear whether this is relevant contributor.  She has seen cardiology and they are planning for repeat echocardiogram.  She is on good  bronchodilator therapy so unclear to me that we need to escalate.  If her cardiology evaluation is unrevealing then we will consider repeating her PFT, changing her bronchodilators  Complex sleep apnea syndrome Continue her Servo ventilator BiPAP.    Baltazar Apo, MD, PhD 01/20/2023, 11:36 AM [facility] [phone] or if no answer [phone]

## 2023-01-20 NOTE — Assessment & Plan Note (Signed)
Continue her Servo ventilator BiPAP.

## 2023-02-03 ENCOUNTER — Ambulatory Visit (HOSPITAL_COMMUNITY): Payer: Medicare Other | Attending: General Practice

## 2023-02-03 ENCOUNTER — Other Ambulatory Visit: Payer: Self-pay | Admitting: *Deleted

## 2023-02-03 DIAGNOSIS — M6281 Muscle weakness (generalized): Secondary | ICD-10-CM | POA: Insufficient documentation

## 2023-02-03 DIAGNOSIS — I428 Other cardiomyopathies: Secondary | ICD-10-CM | POA: Insufficient documentation

## 2023-02-03 DIAGNOSIS — R0602 Shortness of breath: Secondary | ICD-10-CM

## 2023-02-03 LAB — ECHOCARDIOGRAM COMPLETE
AV Vena cont: 0.2 cm
Area-P 1/2: 4.39 cm2
Calc EF: 53.1 %
P 1/2 time: 341 msec
Radius: 0.3 cm
S' Lateral: 2.2 cm
Single Plane A2C EF: 58.4 %
Single Plane A4C EF: 51.2 %

## 2023-02-03 MED ORDER — AMLODIPINE BESYLATE 2.5 MG PO TABS: 2.5000 mg | ORAL_TABLET | Freq: Every day | ORAL | 3 refills | Status: DC

## 2023-02-03 MED ORDER — AMLODIPINE BESYLATE 5 MG PO TABS: 5.0000 mg | ORAL_TABLET | Freq: Every day | ORAL | 3 refills | Status: DC

## 2023-02-08 ENCOUNTER — Other Ambulatory Visit: Payer: Self-pay | Admitting: Neurology

## 2023-02-12 ENCOUNTER — Ambulatory Visit: Payer: Medicare Other | Admitting: Neurology

## 2023-02-12 ENCOUNTER — Encounter: Payer: Self-pay | Admitting: Neurology

## 2023-02-12 VITALS — BP 129/66 | HR 90 | Ht 62.0 in | Wt 138.0 lb

## 2023-02-12 DIAGNOSIS — I4819 Other persistent atrial fibrillation: Secondary | ICD-10-CM

## 2023-02-12 DIAGNOSIS — G620 Drug-induced polyneuropathy: Secondary | ICD-10-CM | POA: Diagnosis not present

## 2023-02-12 DIAGNOSIS — G2581 Restless legs syndrome: Secondary | ICD-10-CM | POA: Diagnosis not present

## 2023-02-12 DIAGNOSIS — I739 Peripheral vascular disease, unspecified: Secondary | ICD-10-CM | POA: Diagnosis not present

## 2023-02-12 DIAGNOSIS — G4731 Primary central sleep apnea: Secondary | ICD-10-CM

## 2023-02-12 DIAGNOSIS — I480 Paroxysmal atrial fibrillation: Secondary | ICD-10-CM | POA: Diagnosis not present

## 2023-02-12 MED ORDER — CLONAZEPAM 0.5 MG PO TABS: 0.2500 mg | ORAL_TABLET | Freq: Every evening | ORAL | 2 refills | Status: DC | PRN

## 2023-02-12 MED ORDER — ROPINIROLE HCL 0.5 MG PO TABS: ORAL_TABLET | ORAL | 5 refills | Status: DC

## 2023-02-12 NOTE — Progress Notes (Signed)
Provider:  Melvyn Novas, MD  Primary Care Physician:  Caesar Bookman, NP [address] [place] [address]     Referring Provider: Melida Quitter, Md [address] [place],  [place] [address]          Chief Complaint according to patient   Patient presents with:     New Patient (Initial Visit)     Patient with history of RLS in room #2  Patient states her RLS has gotten worse and the pain is in her groin area and buttocks area. Patient states it hurt when she sit down in a chair but it gets better when she lays down. No longer on amiodarone , but on Tycosine. Has central apnea and was on AV.       HISTORY OF PRESENT ILLNESS:  Holly Benson is a 89 y.o. female patient who is here for revisit 02/12/2023 for  RLS changes- .  Chief concern according to patient :  my RLS has changed since I started on Ticosyn , now I am better when laying down and worse when seated ! ' pain is in her groin area and buttocks area. Patient states it hurt when she sit down in a chair but it gets better when she lays down. No longer on amiodarone , but on Tycosine.  Had central apnea and was on AV.  Still on Requip, currently 0.25 mg generic form  a day and now changing to 0.5 mg tid po.  Klonopin helps to finally go to sleep.  I refilled.  She reports her index finger and thumbs do tingle and hurt , feel cold,  feeling as if a tight vice is around them. This can be a rheumatological condition.  She remains on Eloquis for atrial fib       Holly Benson is a 89 y.o. year old  Caucasian female patient seen here upon a referral by PCP on 02/13/2022 Presents today for follow up on 02-13-2022;   Mrs. Holly Benson used to be a patient of Dr. Tresa Endo but switched her sleep care over to [place] last year.  She still has restless legs and they may never be completely controllable. She has atrial fibrillation and is symptomatic, she is to be seen at [facility] in the next week for [place].  Her ASV machine is  serving her well in terms of apnea control but because she is so often up and down at night for nocturia reasons it is hard for her to get more than 4 hours each night.  She uses the machine every day-her compliance by time was 75% 23 out of 30 days.  She on average reaches 5 hours and 56 minutes at night on ASV therapy.  Her current machine is set with a minimum expiratory pressure of 5 and maximum expiratory pressure of 10 and minimum pressure support of 3 and a maximum pressure support of 15.  So her residual AHI is 4.1 which is very good she does have a high amount of air leakage, uses a nasal cradle or pillow. Not a P10.  She used aerocare on friendly- now adapt.  She is recovering from dehydration, but remains on HCTZ. She was on amiodarone.        2022;  She states she continues to have ongoing concerns with RLS symptoms. She also says for > 6 mths she has been having numbness.tingling in bilateral thumb,pointer and middle fingers.  PS :  Pt also wanted to know if Dr Vickey Huger would be willing to take on her ASV- she is not getting proper f/u care with her machine ( prescribed by dr Tresa Endo, Cardiologist) and would really like to address this here. She is set up with a machine through [facility] and they don't help with mask refits. Her last SS was in 2020 Dr Tresa Endo. The studies were never read- EPIC NOTE still states  Dr Tresa Endo to read _ ASV results unknown. Referred by Dr Rennis Golden who follows for atrial fib. Dr Elberta Fortis started amiodarone, but after 2-3 years she had to stop it. Amiodarone induced myopathy/ neuropathy.  She feels the mask is not fitting he well at all. She wants to change providers for ASV-   08-15-2021: [name] has felt that it was 4 times daily ropinirole and up to 4 times daily gabapentin she was able to get by with little clonazepam intake -maybe 50 tablets a year.   However nothing has come trolled her restless leg syndrome as well as clonazepam.  There is also the feeling that  she still has medication induced sleepiness. She feels RLS were worse when she is seated and awake rather than in bed.  She does not suddenly fall asleep but she does feel fatigued and sleepy her on medication.  Her last 3 visit in his office were actually done with nurse practitioner Amy Lomax on 12/18/2020 on 3 6 and on 04-23-21.   I will follow this patient after September 2023 for central apnea if she likes me to.       Review of Systems: Out of a complete 14 system review, the patient complains of only the following symptoms, and all other reviewed systems are negative.:  Fatigue, sleepiness , snoring, fragmented sleep, Insomnia, RLS, Nocturia    How likely are you to doze in the following situations: 0 = not likely, 1 = slight chance, 2 = moderate chance, 3 = high chance   Sitting and Reading? Watching Television? Sitting inactive in a public place (theater or meeting)? As a passenger in a car for an hour without a break? Lying down in the afternoon when circumstances permit? Sitting and talking to someone? Sitting quietly after lunch without alcohol? In a car, while stopped for a few minutes in traffic?   Total = 3/ 24 points   FSS endorsed at 49/ 63 points.   Social History   Socioeconomic History   Marital status: Widowed    Spouse name: Not on file   Number of children: 3   Years of education: Not on file   Highest education level: Not on file  Occupational History   Occupation: retired    Comment: retired Comptroller  Tobacco Use   Smoking status: Former    Packs/day: 1.50    Years: 30.00    Additional pack years: 0.00    Total pack years: 45.00    Types: Cigarettes    Start date: 71    Quit date: 11/04/1991    Years since quitting: 31.2   Smokeless tobacco: Never   Tobacco comments:    Former smoker 03/14/22  Vaping Use   Vaping Use: Never used  Substance and Sexual Activity   Alcohol use: Yes    Alcohol/week: 7.0 standard drinks of alcohol    Types: 7  Glasses of wine per week    Comment: 1 glass of wine nightly 03/14/22   Drug use: No   Sexual activity: Not Currently  Other Topics  Concern   Not on file  Social History Narrative   Lives with daughter   Social Determinants of Health   Financial Resource Strain: Not on file  Food Insecurity: Not on file  Transportation Needs: Not on file  Physical Activity: Not on file  Stress: Not on file  Social Connections: Not on file    Family History  Problem Relation Age of Onset   Suicidality Father    Stroke Maternal Grandfather    Hypertension Sister    Colon cancer Neg Hx    Esophageal cancer Neg Hx    Pancreatic cancer Neg Hx    Stomach cancer Neg Hx    Liver disease Neg Hx     Past Medical History:  Diagnosis Date   Arrhythmia    Arthritis    Atrial fibrillation    Constipation    chronic   COPD (chronic obstructive pulmonary disease)    Hyperlipidemia    Hypertension    pulmonary   OSA on CPAP    Osteoarthritis    Osteoporosis    Peripheral neuropathy    Pulmonary hypertension    RLS (restless legs syndrome)    Sleep apnea     Past Surgical History:  Procedure Laterality Date   ABDOMINAL AORTOGRAM W/LOWER EXTREMITY N/A 03/21/2020   Procedure: ABDOMINAL AORTOGRAM W/ Bilateral LOWER EXTREMITY [place];  Surgeon: Iran Ouch, MD;  Location: [facility];  Service: Cardiovascular;  Laterality: N/A;   ABDOMINAL HYSTERECTOMY Bilateral    arm surgery Right    Broken arm and has a plate in it   CATARACT EXTRACTION Bilateral    COLONOSCOPY     More than 10 years [facility]   KNEE ARTHROSCOPY Right    PERIPHERAL VASCULAR INTERVENTION Bilateral 03/21/2020   Procedure: PERIPHERAL VASCULAR INTERVENTION;  Surgeon: Iran Ouch, MD;  Location: [facility];  Service: Cardiovascular;  Laterality: Bilateral;  external iliac   TONSILLECTOMY       Current Outpatient Medications on File Prior to Visit  Medication Sig Dispense Refill    acetaminophen (TYLENOL) 325 MG tablet Take 650 mg by mouth as needed for moderate pain or headache.     albuterol (VENTOLIN HFA) 108 (90 Base) MCG/ACT inhaler Inhale 2 puffs into the lungs every 6 (six) hours as needed for wheezing or shortness of breath. 18 g 6   amLODipine (NORVASC) 2.5 MG tablet Take 1 tablet (2.5 mg total) by mouth daily. 90 tablet 3   amLODipine (NORVASC) 5 MG tablet Take 1 tablet (5 mg total) by mouth daily. 9 tablet 3   apixaban (ELIQUIS) 5 MG TABS tablet Take 1 tablet (5 mg total) by mouth 2 (two) times daily. 180 tablet 1   atenolol (TENORMIN) 50 MG tablet Take 1 tablet (50 mg total) by mouth daily. 30 tablet 2   clonazePAM (KLONOPIN) 0.5 MG tablet Take 0.25 mg by mouth at bedtime as needed for anxiety.     docusate sodium (COLACE) 100 MG capsule Take 300 mg by mouth at bedtime.     dofetilide (TIKOSYN) 250 MCG capsule Take 1 capsule (250 mcg total) by mouth 2 (two) times daily. 180 capsule 3   furosemide (LASIX) 20 MG tablet Take one tablet by mouth on Monday, Wednesday and Friday     irbesartan (AVAPRO) 300 MG tablet Take 1 tablet (300 mg total) by mouth daily. 90 tablet 1   Misc Natural Products (COLON CLEANSE) CAPS Take 4 capsules by mouth as needed (  constipation).     Multiple Vitamins-Minerals (PRESERVISION AREDS 2) CAPS Take by mouth.     Polyethyl Glycol-Propyl Glycol (SYSTANE) 0.4-0.3 % SOLN Place 1 drop into both eyes as needed.     polyethylene glycol (MIRALAX / GLYCOLAX) 17 g packet Take 17 g by mouth 2 (two) times daily.     PRESCRIPTION MEDICATION at bedtime. CPAP     rOPINIRole (REQUIP) 0.25 MG tablet TAKE ONE TABLET BY MOUTH EVERY MORNING AND 3 TABLETS AFTER DINNER 360 tablet 0   Tiotropium Bromide-Olodaterol (STIOLTO RESPIMAT) 2.5-2.5 MCG/ACT AERS USE 2 INHALATIONS ORALLY   EVERY MORNING 12 g 3   Vitamin D-Vitamin K (VITAMIN K2-VITAMIN D3 PO) Take 1 Capful by mouth daily.     Current Facility-Administered Medications on File Prior to Visit  Medication  Dose Route Frequency Provider Last Rate Last Admin   technetium tetrofosmin (TC-MYOVIEW) injection 31.9 millicurie  31.9 millicurie Intravenous Once PRN Chilton Si, MD        Allergies  Allergen Reactions   Lovenox [Enoxaparin Sodium] Hives and Rash    PT STATES SHE BROKE OUT IN A RASH HEAD TO TOE AND LASTED ABOUT 3 WEEKS    Metoprolol Succinate [Metoprolol] Other (See Comments)    Muscle aches, hand pain, and tingling     DIAGNOSTIC DATA (LABS, IMAGING, TESTING) - I reviewed patient records, labs, notes, testing and imaging myself where available.  Lab Results  Component Value Date   WBC 6.8 01/01/2023   HGB 13.4 01/01/2023   HCT 39.0 01/01/2023   MCV 97 01/01/2023   PLT 306 01/01/2023      Component Value Date/Time   NA 138 12/01/2022 0952   K 4.6 12/01/2022 0952   CL 102 12/01/2022 0952   CO2 26 12/01/2022 0952   GLUCOSE 107 (H) 12/01/2022 0952   GLUCOSE 93 05/26/2022 1046   BUN 19 12/01/2022 0952   CREATININE 0.69 12/01/2022 0952   CREATININE 0.81 05/26/2022 1046   CALCIUM 9.5 12/01/2022 0952   PROT 6.9 05/26/2022 1046   PROT 6.2 04/11/2020 0838   ALBUMIN 4.7 04/26/2022 1743   ALBUMIN 4.3 04/11/2020 0838   AST 20 05/26/2022 1046   ALT 17 05/26/2022 1046   ALKPHOS 112 04/26/2022 1743   BILITOT 0.7 05/26/2022 1046   BILITOT 0.5 04/11/2020 0838   GFRNONAA >60 04/26/2022 1743   GFRAA 59 (L) 08/01/2020 1549   Lab Results  Component Value Date   CHOL 231 (H) 05/26/2022   HDL 86 05/26/2022   LDLCALC 125 (H) 05/26/2022   TRIG 98 05/26/2022   CHOLHDL 2.7 05/26/2022   No results found for: "HGBA1C" No results found for: "VITAMINB12" Lab Results  Component Value Date   TSH 2.09 05/26/2022    PHYSICAL EXAM:  Today's Vitals   02/12/23 1545  BP: 129/66  Pulse: 90  Weight: 138 lb (62.6 kg)  Height: 5\' 2"  (1.575 m)   Body mass index is 25.24 kg/m.   Wt Readings from Last 3 Encounters:  02/12/23 138 lb (62.6 kg)  01/20/23 138 lb 12.8 oz (63 kg)   01/01/23 142 lb (64.4 kg)     Ht Readings from Last 3 Encounters:  02/12/23 5\' 2"  (1.575 m)  01/20/23 5\' 2"  (1.575 m)  01/01/23 5\' 2"  (1.575 m)      General: The patient is awake, alert and appears not in acute distress. The patient is well groomed. Head: Normocephalic, atraumatic. Neck is supple. Mallampati 1,  neck circumference:13 inches . Nasal airflow patent.  Retrognathia is seen.  Dental status: intact , small oral opening  Cardiovascular: irregular rate and cardiac rhythm by pulse,  without distended neck veins. Respiratory: Lungs are clear to auscultation.  Skin:  With evidence of ankle edema. . Trunk: The patient's posture is erect.   Neurologic exam : The patient is awake and alert, oriented to place and time.   Memory subjective described as intact.  Attention span & concentration ability appears normal.  Speech is fluent,  with dysphonia  Mood and affect are appropriate.   Cranial nerves: no loss of smell or taste reported  Pupils are equal and briskly reactive to light. Funduscopic exam deferred.   Extraocular movements in vertical and horizontal planes were intact and without nystagmus. No Diplopia. Visual fields by finger perimetry are intact. Hearing was intact to soft voice and finger rubbing.    Facial sensation intact to fine touch.  Facial motor strength is symmetric and tongue and uvula move midline.  Neck ROM : rotation, tilt and flexion extension were normal for age and shoulder shrug was symmetrical.    Motor exam:  Symmetric bulk, tone and ROM.  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    Sensory:  Fine touch, pinprick and vibration were tested and perceived as diminished over the left knee and ankles.  PAD . Decreased sensation in both feet to fine touch.  Hands feel always cold.  Proprioception tested in the upper  extremities was normal. Carpal tunnel bilaterally.    Coordination: Rapid alternating movements in the fingers/hands were of normal speed.  The Finger-to-nose maneuver was intact without evidence of ataxia  or tremor.  There was dysmetria on the left.    Gait and station: Patient could rise unassisted from a seated position, walked sometimes with a cane " for balance" as assistive device.  Deep tendon reflexes: in the upper and lower extremities are symmetric and intact.  Babinski response was deferred .      ASSESSMENT AND PLAN 89 y.o. year old female who reports severe RLS , seen  here with: hereditary form of RLS, 2 grandmothers and my son have this condition.      1) excessive  daytime sleepiness and fatigue , attributed by the patient to medication.  2) RLS :  pain is in her groin area and buttocks area. Patient states it hurt when she sit down in a chair but it gets better when she lays down. No longer on amiodarone , but on Tycosine.  3) Had central apnea and was on AV.  Still on Requip, currently 0.25 mg generic form  a day and now changing to 0.5 mg tid po.  4) Klonopin helps to finally go to sleep.  I refilled.    She reports her index finger and thumbs do tingle and hurt , feel cold,  feeling as if a tight vice is around them. This can be a rheumatological condition.  Carpal tunnel and PAD contribute too.  She remains on Eloquis for atrial fib.  We plan to follow up  through our NP within 6 months. The patient is awake, alert and appears not in acute distress. The patient is well groomed. Head: Normocephalic, atraumatic. Neck is supple. Mallampati 1,  neck circumference:13 inches . Nasal airflow patent.  Retrognathia is seen.  Dental status: intact , small oral opening  Cardiovascular: irregular rate and cardiac  rhythm by pulse,  without distended neck veins. Respiratory: Lungs are clear to auscultation.  Skin:  With evidence of ankle edema. . Trunk: The patient's posture  is erect.   Neurologic exam : The patient is awake and alert, oriented to place and time.   Memory subjective described as intact.  Attention span & concentration ability appears normal.  Speech is fluent,  with dysphonia  Mood and affect are appropriate.   Cranial nerves: no loss of smell or taste reported  Pupils are equal and briskly reactive to light. Funduscopic exam deferred.   Extraocular movements in vertical and horizontal planes were intact and without nystagmus. No Diplopia. Visual fields by finger perimetry are intact. Hearing was intact to soft voice and finger rubbing.    Facial sensation intact to fine touch.  Facial motor strength is symmetric and tongue and uvula move midline.  Neck ROM : rotation, tilt and flexion extension were normal for age and shoulder shrug was symmetrical.    Motor exam:  Symmetric bulk, tone and ROM.  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    Sensory:  Fine touch, pinprick and vibration were tested and perceived as diminished over the left knee and ankles.  PAD . Decreased sensation in both feet to fine touch.  Hands feel always cold.  Proprioception tested in the upper extremities was normal. Carpal tunnel bilaterally.    Coordination: Rapid alternating movements in the fingers/hands were of normal speed.  The Finger-to-nose maneuver was intact without evidence of ataxia  or tremor.  There was dysmetria on the left.    Gait and station: Patient could rise unassisted from a seated position, walked sometimes with a cane " for balance" as assistive device.  Deep tendon reflexes: in the upper and lower extremities are symmetric and intact.  Babinski response was deferred .      I would like to thank Ngetich, Donalee Citrin, NP and Melida Quitter, Md [address] [place],  [place] [address] for allowing me to meet with and to  take care of this pleasant patient.   CC:  After spending a total time of  31  minutes face to face and additional time for physical and neurologic examination, review of laboratory studies,  personal review of imaging studies, reports and results of other testing and review of referral information / records as far as provided in visit,   Electronically signed by: Melvyn Novas, MD 02/12/2023 4:32 PM  [facility] Board certified by The [employer] of Sleep Medicine and Diplomate of the [employer] of [employer]. Board certified In Neurology through the ABPN, Fellow of the [employer] of [employer]. Medical Director of [facility].

## 2023-02-13 NOTE — Progress Notes (Unsigned)
Cardiology Clinic Note   Patient Name: Holly Benson Date of Encounter: 02/17/2023  Primary Care Provider:  Caesar Bookman, NP Primary Cardiologist:  Chrystie Nose, MD  Patient Profile    Holly Benson 89 year old female presents the clinic today for follow-up evaluation of her essential hypertension and atrial fibrillation.  Past Medical History    Past Medical History:  Diagnosis Date   Arrhythmia    Arthritis    Atrial fibrillation    Constipation    chronic   COPD (chronic obstructive pulmonary disease)    Hyperlipidemia    Hypertension    pulmonary   OSA on CPAP    Osteoarthritis    Osteoporosis    Peripheral neuropathy    Pulmonary hypertension    RLS (restless legs syndrome)    Sleep apnea    Past Surgical History:  Procedure Laterality Date   ABDOMINAL AORTOGRAM W/LOWER EXTREMITY N/A 03/21/2020   Procedure: ABDOMINAL AORTOGRAM W/ Bilateral LOWER EXTREMITY Run[place];  Surgeon: Iran Ouch, MD;  Location: [facility];  Service: Cardiovascular;  Laterality: N/A;   ABDOMINAL HYSTERECTOMY Bilateral    arm surgery Right    Broken arm and has a plate in it   CATARACT EXTRACTION Bilateral    COLONOSCOPY     More than 10 years ago In [facility]   KNEE ARTHROSCOPY Right    PERIPHERAL VASCULAR INTERVENTION Bilateral 03/21/2020   Procedure: PERIPHERAL VASCULAR INTERVENTION;  Surgeon: Iran Ouch, MD;  Location: [facility];  Service: Cardiovascular;  Laterality: Bilateral;  external iliac   TONSILLECTOMY      Allergies  Allergies  Allergen Reactions   Lovenox [Enoxaparin Sodium] Hives and Rash    PT STATES SHE BROKE OUT IN A RASH HEAD TO TOE AND LASTED ABOUT 3 WEEKS    Metoprolol Succinate [Metoprolol] Other (See Comments)    Muscle aches, hand pain, and tingling    History of Present Illness    Holly Benson has a PMH of hypertension, atrial flutter, hyperlipidemia, obstructive sleep apnea, COPD, peripheral arterial  disease, and atrial fibrillation.  CHA2DS2-VASc score 5 on Eliquis.   She was  seen by Alphonzo Severance, PA-C on 05/17/2021.  Of note she was previously on amiodarone which was discontinued due to lung toxicity.  When she was seen in the atrial fibrillation clinic she described a frequent fluttering type sensation which would last for 10 to 15 minutes and happen multiple times per day.  She was started on diltiazem 30 mg daily.   She contacted the nurse triage line 07/11/2021.  Her metoprolol was transitioned to carvedilol.  With metoprolol she did notice tingling and numbness with an icy type pain in her thumb and first 2 fingers on each hand.   She presented to the clinic 07/15/2021 for follow-up evaluation stated she was out of town for several days and noticed that her blood pressure was low.  She reported that she stopped taking her afternoon dose of hydralazine and did not notice much change with her blood pressure.  She discontinued her morning dose of hydralazine as well.  She noticed a pain in her hands and tingling with metoprolol.  She also noticed some occasional ankle edema.  I changed her metoprolol to carvedilol, gave her the [place] support stocking sheet, discontinued her hydralazine, gave her the salty 6 diet sheet, asked her to maintain her physical activity, and planned follow-up in 1 to 2 months.  Have asked her to maintain a  blood pressure log.   She reported that she contacted the office earlier last week for follow-up appointment.  She was told that she would not be scheduled for an appointment with Dr. Rennis Golden for several months.  She contacted the patient advocate representatives with her complaint.  She was added onto my schedule today.   She followed up with [name] PA-C on 08/06/2021.  She had contacted the office with reports of increased palpitations.  She questioned if she was having more episodes of atrial fibrillation.  She presented to the clinic alone.  She reported that she  did not want to distinguish between PACs and paroxysmal atrial fibrillation.  She noted palpitations at least once daily that would last for around 30 minutes.  She denied dizziness, presyncope, syncope with the episodes.  She had not taken her as needed Cardizem.  She was maintaining a blood pressure log.  Her systolic blood pressures were in the 80- 100s.  She denied bleeding issues.  She was tolerating carvedilol well.   She presented to the clinic 10/08/2021 for follow-up evaluation stated over the last week she had noticed increased shortness of breath and had has been taking her diltiazem more regularly due to increased heart rate.  Her blood pressures had been slightly elevated.  In  clinic her heart rate was 88 with a blood pressure of 116/60.  She reported that she  had some increased phlegm and has just returned from seeing her family.  She had a call from her pharmacist who lowered her apixaban dosing due to her weight.  She had regained weight.  I  increased her apixaban dosing back to 5 mg twice daily.  I will ordered an echocardiogram, CBC, and scheduled follow-up for February.   She was seen by Otilio Saber, PA-C 10/10/2021.  During that time she reported that she was doing okay overall.  She noted shortness of breath and fatigue x10 days.  It was felt that it was likely related to her atrial fibrillation.  Her heart rates at home were 80-1 100s.  Her weight was slightly increased and she noted more ankle swelling.  She was prescribed furosemide 20 mg as needed for lower extremity swelling.   She was seen by Dr. Okey Regal 10/22/2021.  At that time she was interested in starting Tikosyn in February because she was planning a trip to [place] for the month of January.  Her weight remains stable and her heart rate was rate controlled.   She presented to the clinic 12/12/21 for follow-up evaluation and states she had a nice trip to [facility].  She reported that someone told her to stop taking her  diltiazem.  She had not taken the medication in around 1 and half months.  She reported that her heart rate had been 90s and low 100s.  She felt that her shortness of breath was stable.  We reviewed the medication and recommendations for starting Tikosyn.  I have asked her to follow-up with the atrial fibrillation clinic, I prescribed 60 mg of extended release diltiazem, and plan follow-up for 3 to 4 months.  She was noted to have changes on CT scan noted in her lungs.  Her amiodarone was stopped.  She underwent load of dofetilide.  She followed up with Dr. Elberta Fortis 12/01/2022.  She denied chest pain and shortness of breath.  She denied dizziness presyncope and syncope.  She was tolerating medications well.  She denied further episodes of atrial fibrillation.  She underwent repair  of ventral hernia and reported that her appetite was getting better.  Chest Vascor was noted to be 4.  She was continued on apixaban, metoprolol and follow-up was planned for 6 months.  She presented to the clinic 01/01/2023 for follow-up evaluation and stated she had been noticing weakness, muscle aches, and shortness of breath over the last 6 weeks.  Her blood pressures had been elevated in the morning and stabilized throughout the day.  We reviewed her previous echocardiogram and last clinic visit.  She expressed understanding.  She felt that she had recovered fairly well since having her hernia repair.  However, she had not been able to return to her baseline physical activity since her surgery.  She had been eating better and her weight was slightly increased.  I  ordered a CBC and echocardiogram.  Will plan follow-up in 1 month - 2 months.  She planned to travel to [facility] to see a glass blowing exhibit in the near future.  Echocardiogram 02/03/2023 showed normal EF and intermediate diastolic parameters.  CBC was stable.  She presents to the clinic today for follow-up evaluation and states her blood pressure has been better  controlled.  She still has some weakness and fatigue.  She feels this may be related to her beta-blocker.  We discussed her previous medications and atrial fibrillation.  I explained that this is managed by EP and would be able to offer recommendations.  She expressed understanding.  She enjoyed her trip to [place].  She has been somewhat physically active and continues to do yoga.  I will refill her amlodipine and plan follow-up in 6 months.  I encouraged her to follow-up with EP for recommendations on atenolol.      Today she denies chest pain, lower extremity edema,  melena, hematuria, hemoptysis, diaphoresis, weakness, presyncope, syncope, orthopnea, and PND.     Home Medications    Prior to Admission medications   Medication Sig Start Date End Date Taking? Authorizing Provider  acetaminophen (TYLENOL) 325 MG tablet Take 650 mg by mouth as needed for moderate pain or headache.    [provider]  albuterol (VENTOLIN HFA) 108 (90 Base) MCG/ACT inhaler Inhale 2 puffs into the lungs every 6 (six) hours as needed for wheezing or shortness of breath. 03/10/22   Parrett, Virgel Bouquet, NP  amLODipine (NORVASC) 5 MG tablet Take 1 tablet (5 mg total) by mouth daily. 12/01/22   Camnitz, Andree Coss, MD  apixaban (ELIQUIS) 5 MG TABS tablet Take 1 tablet (5 mg total) by mouth 2 (two) times daily. 12/10/22   Camnitz, Andree Coss, MD  atenolol (TENORMIN) 50 MG tablet Take 1 tablet (50 mg total) by mouth daily. 12/02/22   Hilty, Lisette Abu, MD  clonazePAM (KLONOPIN) 0.5 MG tablet Take 0.25 mg by mouth at bedtime as needed for anxiety.    [provider]  docusate sodium (COLACE) 100 MG capsule Take 300 mg by mouth at bedtime.    [provider]  dofetilide (TIKOSYN) 250 MCG capsule Take 1 capsule (250 mcg total) by mouth 2 (two) times daily. 08/14/22   Sheilah Pigeon, PA-C  furosemide (LASIX) 20 MG tablet Take one tablet by mouth on Monday, Wednesday and Friday 02/28/22   Fenton, Clint R,  PA  irbesartan (AVAPRO) 300 MG tablet Take 1 tablet (300 mg total) by mouth daily. 11/12/22   Hilty, Lisette Abu, MD  Misc Natural Products (COLON CLEANSE) CAPS Take 4 capsules by mouth as needed (constipation).  [provider]  OVER THE COUNTER MEDICATION Take 3 capsules by mouth daily. [place], Bone Strength    [provider]  Polyethyl Glycol-Propyl Glycol (SYSTANE) 0.4-0.3 % SOLN Place 1 drop into both eyes as needed.    [provider]  polyethylene glycol (MIRALAX / GLYCOLAX) 17 g packet Take 17 g by mouth 2 (two) times daily.    [provider]  PRESCRIPTION MEDICATION at bedtime. CPAP    [provider]  rOPINIRole (REQUIP) 0.25 MG tablet Take 1 tablet in the morning and three tablets after dinner po 02/13/22   Dohmeier, Porfirio Mylar, MD  Tiotropium Bromide-Olodaterol (STIOLTO RESPIMAT) 2.5-2.5 MCG/ACT AERS USE 2 INHALATIONS ORALLY   EVERY MORNING 05/30/22   Byrum, Les Pou, MD  Vitamin D-Vitamin K (VITAMIN K2-VITAMIN D3 PO) Take 1 Capful by mouth daily.    [provider]    Family History    Family History  Problem Relation Age of Onset   Suicidality Father    Stroke Maternal Grandfather    Hypertension Sister    Colon cancer Neg Hx    Esophageal cancer Neg Hx    Pancreatic cancer Neg Hx    Stomach cancer Neg Hx    Liver disease Neg Hx    She indicated that her mother is deceased. She indicated that her father is deceased. She indicated that her sister is alive. She indicated that her maternal grandfather is deceased. She indicated that the status of her neg hx is unknown.  Social History    Social History   Socioeconomic History   Marital status: Widowed    Spouse name: Not on file   Number of children: 3   Years of education: Not on file   Highest education level: Not on file  Occupational History   Occupation: retired    Comment: retired Comptroller  Tobacco Use   Smoking status: Former    Packs/day: 1.50     Years: 30.00    Additional pack years: 0.00    Total pack years: 45.00    Types: Cigarettes    Start date: 22    Quit date: 11/04/1991    Years since quitting: 31.3   Smokeless tobacco: Never   Tobacco comments:    Former smoker 03/14/22  Vaping Use   Vaping Use: Never used  Substance and Sexual Activity   Alcohol use: Yes    Alcohol/week: 7.0 standard drinks of alcohol    Types: 7 Glasses of wine per week    Comment: 1 glass of wine nightly 03/14/22   Drug use: No   Sexual activity: Not Currently  Other Topics Concern   Not on file  Social History Narrative   Lives with daughter   Social Determinants of Health   Financial Resource Strain: Not on file  Food Insecurity: Not on file  Transportation Needs: Not on file  Physical Activity: Not on file  Stress: Not on file  Social Connections: Not on file  Intimate Partner Violence: Not on file     Review of Systems    General:  No chills, fever, night sweats or weight changes.  Cardiovascular:  No chest pain, dyspnea on exertion, edema, orthopnea, palpitations, paroxysmal nocturnal dyspnea. Dermatological: No rash, lesions/masses Respiratory: No cough, dyspnea Urologic: No hematuria, dysuria Abdominal:   No nausea, vomiting, diarrhea, bright red blood per rectum, melena, or hematemesis Neurologic:  No visual changes, wkns, changes in mental status. All other systems reviewed and are otherwise negative except as noted  above.  Physical Exam    VS:  BP 123/74   Pulse 88   Ht 5\' 2"  (1.575 m)   Wt 141 lb (64 kg)   SpO2 94%   BMI 25.79 kg/m  , BMI Body mass index is 25.79 kg/m. GEN: Well nourished, well developed, in no acute distress. HEENT: normal. Neck: Supple, no JVD, carotid bruits, or masses. Cardiac: RRR, no murmurs, rubs, or gallops. No clubbing, cyanosis, bilateral ankle edema.  Radials/DP/PT 2+ and equal bilaterally.  Respiratory:  Respirations regular and unlabored, clear to auscultation bilaterally.   GI:  Soft, nontender, nondistended, BS + x 4. MS: no deformity or atrophy.   Skin: warm and dry, no rash. Neuro:  Strength and sensation are intact. Psych: Normal affect.  Accessory Clinical Findings    Recent Labs: 03/10/2022: Pro B Natriuretic peptide (BNP) 210.0 05/26/2022: ALT 17; TSH 2.09 12/01/2022: BUN 19; Creatinine, Ser 0.69; Magnesium 1.9; Potassium 4.6; Sodium 138 01/01/2023: Hemoglobin 13.4; Platelets 306   Recent Lipid Panel    Component Value Date/Time   CHOL 231 (H) 05/26/2022 1046   CHOL 149 04/11/2020 0838   TRIG 98 05/26/2022 1046   HDL 86 05/26/2022 1046   HDL 69 04/11/2020 0838   CHOLHDL 2.7 05/26/2022 1046   LDLCALC 125 (H) 05/26/2022 1046         ECG personally reviewed by me today-none today.  Nuclear stress testing 04/04/2022    The study is normal. Findings are consistent with no prior ischemia and no prior myocardial infarction. The study is low risk.   No ST deviation was noted.   LV perfusion is normal. There is no evidence of ischemia. There is no evidence of infarction.   Left ventricular function is normal. Nuclear stress EF: 64 %. The left ventricular ejection fraction is normal (55-65%). End diastolic cavity size is normal. End systolic cavity size is normal.   Prior study available for comparison from 12/08/2018.  Echocardiogram 04/17/2022  IMPRESSIONS     1. Global longitudinal strain is -17.1% Improved from echo in Dec 2022.  Left ventricular ejection fraction, by estimation, is 65 to 70%. The left  ventricle has normal function. The left ventricle has no regional wall  motion abnormalities. Left  ventricular diastolic parameters were normal.   2. Right ventricular systolic function is mildly reduced. The right  ventricular size is normal. There is moderately elevated pulmonary artery  systolic pressure.   3. Right atrial size was severely dilated.   4. The mitral valve is normal in structure. Mild mitral valve  regurgitation.   5. Tricuspid  valve regurgitation is mild to moderate.   6. The aortic valve is tricuspid. Aortic valve regurgitation is mild.  Aortic valve sclerosis is present, with no evidence of aortic valve  stenosis.   FINDINGS   Left Ventricle: Global longitudinal strain is -17.1% Improved from echo  in Dec 2022. Left ventricular ejection fraction, by estimation, is 65 to  70%. The left ventricle has normal function. The left ventricle has no  regional wall motion abnormalities.  The left ventricular internal cavity size was normal in size. There is no  left ventricular hypertrophy. Left ventricular diastolic parameters were  normal.   Right Ventricle: The right ventricular size is normal. Right vetricular  wall thickness was not assessed. Right ventricular systolic function is  mildly reduced. There is moderately elevated pulmonary artery systolic  pressure. The tricuspid regurgitant  velocity is 3.14 m/s, and with an assumed right atrial pressure of 8  mmHg,  the estimated right ventricular systolic pressure is 47.4 mmHg.   Left Atrium: Left atrial size was normal in size.   Right Atrium: Right atrial size was severely dilated.   Pericardium: There is no evidence of pericardial effusion.   Mitral Valve: The mitral valve is normal in structure. Mild mitral valve  regurgitation.   Tricuspid Valve: The tricuspid valve is normal in structure. Tricuspid  valve regurgitation is mild to moderate.   Aortic Valve: The aortic valve is tricuspid. Aortic valve regurgitation is  mild. Aortic regurgitation PHT measures 612 msec. Aortic valve sclerosis  is present, with no evidence of aortic valve stenosis.   Pulmonic Valve: The pulmonic valve was normal in structure. Pulmonic valve  regurgitation is not visualized.   Aorta: The aortic root and ascending aorta are structurally normal, with  no evidence of dilitation.   IAS/Shunts: No atrial level shunt detected by color flow Doppler.   Echocardiogram  02/03/2023  IMPRESSIONS     1. Left ventricular ejection fraction, by estimation, is 60 to 65%. The  left ventricle has normal function. The left ventricle has no regional  wall motion abnormalities. Left ventricular diastolic parameters are  indeterminate. The average left  ventricular global longitudinal strain is -17.9 %. The global longitudinal  strain is normal.   2. Right ventricular systolic function is normal. The right ventricular  size is normal. There is mildly elevated pulmonary artery systolic  pressure. The estimated right ventricular systolic pressure is 40.9 mmHg.   3. The mitral valve is degenerative. Mild mitral valve regurgitation. No  evidence of mitral stenosis. Moderate mitral annular calcification.   4. Tricuspid valve regurgitation is moderate.   5. The aortic valve has an indeterminant number of cusps. Aortic valve  regurgitation is mild to moderate. Aortic valve sclerosis/calcification is  present, without any evidence of aortic stenosis. Aortic regurgitation PHT  measures 341 msec. By PHT the AI   is moderate but visually appears mild.   6. The inferior vena cava is normal in size with greater than 50%  respiratory variability, suggesting right atrial pressure of 3 mmHg.   7. Right atrial size was severely dilated.   8. Left atrial size was mildly dilated.   FINDINGS   Left Ventricle: Left ventricular ejection fraction, by estimation, is 60  to 65%. The left ventricle has normal function. The left ventricle has no  regional wall motion abnormalities. The average left ventricular global  longitudinal strain is -17.9 %.  The global longitudinal strain is normal. The left ventricular internal  cavity size was normal in size. There is no left ventricular hypertrophy.  Left ventricular diastolic parameters are indeterminate. Normal left  ventricular filling pressure.   Right Ventricle: The right ventricular size is normal. No increase in  right ventricular  wall thickness. Right ventricular systolic function is  normal. There is mildly elevated pulmonary artery systolic pressure. The  tricuspid regurgitant velocity is 3.08   m/s, and with an assumed right atrial pressure of 3 mmHg, the estimated  right ventricular systolic pressure is 40.9 mmHg.   Left Atrium: Left atrial size was mildly dilated.   Right Atrium: Right atrial size was severely dilated.   Pericardium: There is no evidence of pericardial effusion.   Mitral Valve: The mitral valve is degenerative in appearance. There is  mild thickening of the mitral valve leaflet(s). Moderate mitral annular  calcification. Mild mitral valve regurgitation. No evidence of mitral  valve stenosis.   Tricuspid Valve: The tricuspid  valve is normal in structure. Tricuspid  valve regurgitation is moderate . No evidence of tricuspid stenosis.   Aortic Valve: By PHT the AI is moderate but visually appears mild. The  aortic valve has an indeterminant number of cusps. Aortic valve  regurgitation is mild to moderate. Aortic regurgitation PHT measures 341  msec. Aortic valve sclerosis/calcification is  present, without any evidence of aortic stenosis.   Pulmonic Valve: The pulmonic valve was normal in structure. Pulmonic valve  regurgitation is trivial. No evidence of pulmonic stenosis.   Aorta: The aortic root is normal in size and structure.   Venous: The inferior vena cava is normal in size with greater than 50%  respiratory variability, suggesting right atrial pressure of 3 mmHg.   IAS/Shunts: No atrial level shunt detected by color flow Doppler.   Assessment & Plan   1.  Cardiomyopathy-NYHA class II.  Weight today 141 pounds.  Recent echocardiogram showed normal EF with intermediate diastolic parameters.  Details above.  Echocardiogram 04/17/2022 showed EF of 65-70%, moderately elevated pulmonary artery pressures, severely dilated right atria, mild mitral valve regurgitation and mild aortic  valve regurgitation.  Stress testing 04/04/2022 showed no ischemia and low risk.  Reports increased furosemide use Continue current medication regimen and diet. Order BMP  Essential hypertension-BP today 123/74.  Improvement in blood pressure since increasing amlodipine.   Continue atenolol, irbesartan, furosemide Continue increased amlodipine to 7.5 mg daily Heart healthy low-sodium diet Increase physical activity as tolerated  Paroxysmal atrial fibrillation-heart rate 88 bpm.  Regular.  Today she denies episodes of accelerated or irregular heartbeat.   CHA2DS2-VASc score 4.  No bleeding issues.  Weight stable.   Continue Tikosyn, atenolol  continue apixaban to 5 mg twice daily Avoid triggers-reviewed Follows with A-fib clinic, EP  Obstructive sleep apnea-compliant with CPAP.  Continues to wake up well rested. Continue CPAP use Elevate head of bed, avoid supine sleeping.   Disposition: Follow-up with Dr. Rennis Golden or me in 6 months and A. fib clinic as scheduled.   Thomasene Ripple. Adaijah Endres NP-C     02/17/2023, 10:33 AM [facility] [address] Office [phone] Fax [phone]    I spent 14 minutes examining this patient, reviewing medications, and using patient centered shared decision making involving her cardiac care.  Prior to her visit I spent greater than 20 minutes reviewing her past medical history,  medications, and prior cardiac tests.

## 2023-02-16 DIAGNOSIS — L570 Actinic keratosis: Secondary | ICD-10-CM | POA: Diagnosis not present

## 2023-02-16 DIAGNOSIS — Z85828 Personal history of other malignant neoplasm of skin: Secondary | ICD-10-CM | POA: Diagnosis not present

## 2023-02-16 DIAGNOSIS — L700 Acne vulgaris: Secondary | ICD-10-CM | POA: Diagnosis not present

## 2023-02-17 ENCOUNTER — Encounter: Payer: Self-pay | Admitting: General Practice

## 2023-02-17 ENCOUNTER — Ambulatory Visit: Payer: Medicare Other | Attending: General Practice | Admitting: General Practice

## 2023-02-17 VITALS — BP 123/74 | HR 88 | Ht 62.0 in | Wt 141.0 lb

## 2023-02-17 DIAGNOSIS — I10 Essential (primary) hypertension: Secondary | ICD-10-CM

## 2023-02-17 DIAGNOSIS — G4733 Obstructive sleep apnea (adult) (pediatric): Secondary | ICD-10-CM

## 2023-02-17 DIAGNOSIS — I480 Paroxysmal atrial fibrillation: Secondary | ICD-10-CM

## 2023-02-17 DIAGNOSIS — I428 Other cardiomyopathies: Secondary | ICD-10-CM | POA: Diagnosis not present

## 2023-02-17 MED ORDER — AMLODIPINE BESYLATE 5 MG PO TABS: 5.0000 mg | ORAL_TABLET | Freq: Every day | ORAL | 7 refills | Status: DC

## 2023-02-17 NOTE — Patient Instructions (Signed)
Medication Instructions:  The current medical regimen is effective;  continue present plan and medications as directed. Please refer to the Current Medication list given to you today. *If you need a refill on your cardiac medications before your next appointment, please call your pharmacy*  Lab Work: BMET TODAY If you have labs (blood work) drawn today and your tests are completely normal, you will receive your results only by:  MyChart Message (if you have MyChart) OR  A paper copy in the mail If you have any lab test that is abnormal or we need to change your treatment, we will call you to review the results.  Other Instructions INCREASE HYDRATION  CONTINUE TO WEAR YOUR COMPRESSION STOCKINGS.   Follow-Up: At [facility], you and your health needs are our priority.  As part of our continuing mission to provide you with exceptional heart care, we have created designated Provider Care Teams.  These Care Teams include your primary Cardiologist (physician) and Advanced Practice Providers (APPs -  Physician Assistants and Nurse Practitioners) who all work together to provide you with the care you need, when you need it.  Your next appointment:   6 month(s)  Provider:   Chrystie Nose, MD  and TILLERY-EP 1st AVAILABLE

## 2023-02-18 LAB — BASIC METABOLIC PANEL
BUN/Creatinine Ratio: 30 — ABNORMAL HIGH (ref 12–28)
BUN: 26 mg/dL (ref 8–27)
CO2: 24 mmol/L (ref 20–29)
Calcium: 9.5 mg/dL (ref 8.7–10.3)
Chloride: 101 mmol/L (ref 96–106)
Creatinine, Ser: 0.88 mg/dL (ref 0.57–1.00)
Glucose: 84 mg/dL (ref 70–99)
Potassium: 4.9 mmol/L (ref 3.5–5.2)
Sodium: 140 mmol/L (ref 134–144)
eGFR: 64 mL/min/{1.73_m2} (ref >59)

## 2023-02-18 NOTE — Progress Notes (Signed)
  Electrophysiology Office Note:   Date:  02/19/2023  ID:  Holly Benson, DOB 04-05-1933, MRN [id number]  Primary Cardiologist: Chrystie Nose, MD Electrophysiologist: Regan Lemming, MD   History of Present Illness:   Holly Benson is a 89 y.o. female with h/o PAF, AFL, HLD, OSa on CPAP, HTN, and COPD seen today for routine electrophysiology followup. Since last being seen in our clinic the patient reports doing poorly.  She states she hasn't felt quite right since the middle of February. Since she was changed from diltiazem to atenolol.  Mostly fatigue, with some mild SOB with more than usual exertion. Denies palpitations, chest pain, dizziness or syncope.   Review of systems complete and found to be negative unless listed in HPI.   Studies Reviewed:    EKG is ordered today. Personal review shows ? AT vs slow atrial flutter at 85 bpm with atrial cycle length of ~ 360 ms  Risk Assessment/Calculations:             Physical Exam:   VS:  BP 124/72   Pulse 85   Ht 5\' 2"  (1.575 m)   Wt 140 lb (63.5 kg)   SpO2 96%   BMI 25.61 kg/m    Wt Readings from Last 3 Encounters:  02/19/23 140 lb (63.5 kg)  02/17/23 141 lb (64 kg)  02/12/23 138 lb (62.6 kg)     GEN: Well nourished, well developed in no acute distress NECK: No JVD; No carotid bruits CARDIAC: Regular rate and rhythm, no murmurs, rubs, gallops RESPIRATORY:  Clear to auscultation without rales, wheezing or rhonchi  ABDOMEN: Soft, non-tender, non-distended EXTREMITIES:  No edema; No deformity   ASSESSMENT AND PLAN:    Persistent atrial fibrillation/flutter:  EKG today shows likely AT vs slow flutter.  Continue eliquis 5 mg for CHA2DS2VASc  of at least 4. Weight is borderline, follow chronically for dose adjustment.  Stop atenolol. Start bisoprolol 10 mg daily to see if a more selective BB with help with her general, non-specific symptoms.  Do not suspect that a rate controlled AT / AFL would likely cause her symptoms,  but will potentially plan for cardioversion pending MD discussion.    OSA  Encouraged nightly CPAP   HTN Stable on current regimen  Follow with med changes.   Follow up with EP APP in 2 weeks for assessment and repeat EKG.   Signed, Holly Freer, PA-C

## 2023-02-18 NOTE — H&P (View-Only) (Signed)
  Electrophysiology Office Note:   Date:  02/19/2023  ID:  Holly Benson, DOB 09/24/1933, MRN [id number]  Primary Cardiologist: Kenneth C Hilty, MD Electrophysiologist: Will Martin Camnitz, MD   History of Present Illness:   Holly Benson is a 89 y.o. female with h/o PAF, AFL, HLD, OSa on CPAP, HTN, and COPD seen today for routine electrophysiology followup. Since last being seen in our clinic the patient reports doing poorly.  She states she hasn't felt quite right since the middle of February. Since she was changed from diltiazem to atenolol.  Mostly fatigue, with some mild SOB with more than usual exertion. Denies palpitations, chest pain, dizziness or syncope.   Review of systems complete and found to be negative unless listed in HPI.   Studies Reviewed:    EKG is ordered today. Personal review shows ? AT vs slow atrial flutter at 85 bpm with atrial cycle length of ~ 360 ms  Risk Assessment/Calculations:             Physical Exam:   VS:  BP 124/72   Pulse 85   Ht 5' 2" (1.575 m)   Wt 140 lb (63.5 kg)   SpO2 96%   BMI 25.61 kg/m    Wt Readings from Last 3 Encounters:  02/19/23 140 lb (63.5 kg)  02/17/23 141 lb (64 kg)  02/12/23 138 lb (62.6 kg)     GEN: Well nourished, well developed in no acute distress NECK: No JVD; No carotid bruits CARDIAC: Regular rate and rhythm, no murmurs, rubs, gallops RESPIRATORY:  Clear to auscultation without rales, wheezing or rhonchi  ABDOMEN: Soft, non-tender, non-distended EXTREMITIES:  No edema; No deformity   ASSESSMENT AND PLAN:    Persistent atrial fibrillation/flutter:  EKG today shows likely AT vs slow flutter.  Continue eliquis 5 mg for CHA2DS2VASc  of at least 4. Weight is borderline, follow chronically for dose adjustment.  Stop atenolol. Start bisoprolol 10 mg daily to see if a more selective BB with help with her general, non-specific symptoms.  Do not suspect that a rate controlled AT / AFL would likely cause her symptoms,  but will potentially plan for cardioversion pending MD discussion.    OSA  Encouraged nightly CPAP   HTN Stable on current regimen  Follow with med changes.   Follow up with EP APP in 2 weeks for assessment and repeat EKG.   Signed, Diogo Anne Andrew Ritter Helsley, PA-C  

## 2023-02-19 ENCOUNTER — Encounter: Payer: Self-pay | Admitting: Student

## 2023-02-19 ENCOUNTER — Ambulatory Visit: Payer: Medicare Other | Attending: Student | Admitting: Student

## 2023-02-19 ENCOUNTER — Encounter: Payer: Self-pay | Admitting: *Deleted

## 2023-02-19 VITALS — BP 124/72 | HR 85 | Ht 62.0 in | Wt 140.0 lb

## 2023-02-19 DIAGNOSIS — I4892 Unspecified atrial flutter: Secondary | ICD-10-CM

## 2023-02-19 DIAGNOSIS — I10 Essential (primary) hypertension: Secondary | ICD-10-CM | POA: Diagnosis not present

## 2023-02-19 DIAGNOSIS — G4733 Obstructive sleep apnea (adult) (pediatric): Secondary | ICD-10-CM | POA: Insufficient documentation

## 2023-02-19 MED ORDER — BISOPROLOL FUMARATE 10 MG PO TABS: 10.0000 mg | ORAL_TABLET | Freq: Every day | ORAL | 1 refills | Status: DC

## 2023-02-19 NOTE — Patient Instructions (Addendum)
Medication Instructions:   START TAKING:  BISOPROLOL 10  MG AT NIGHT   STOP TAKING AND REMOVE THIS MEDICATION FROM YOUR MEDICATION LIST:  ATENOLOL 50 MG   *If you need a refill on your cardiac medications before your next appointment, please call your pharmacy*    Lab Work: NONE ORDERED  TODAY   If you have labs (blood work) drawn today and your tests are completely normal, you will receive your results only by: MyChart Message (if you have MyChart) OR A paper copy in the mail If you have any lab test that is abnormal or we need to change your treatment, we will call you to review the results.   Testing/Procedures:  SEE LETTER FOR CARDIOVERSION Your physician has recommended that you have a Cardioversion (DCCV). Electrical Cardioversion uses a jolt of electricity to your heart either through paddles or wired patches attached to your chest. This is a controlled, usually prescheduled, procedure. Defibrillation is done under light anesthesia in the hospital, and you usually go home the day of the procedure. This is done to get your heart back into a normal rhythm. You are not awake for the procedure. Please see the instruction sheet given to you today.    Follow-Up: At [facility], you and your health needs are our priority.  As part of our continuing mission to provide you with exceptional heart care, we have created designated Provider Care Teams.  These Care Teams include your primary Cardiologist (physician) and Advanced Practice Providers (APPs -  Physician Assistants and Nurse Practitioners) who all work together to provide you with the care you need, when you need it.  We recommend signing up for the patient portal called "MyChart".  Sign up information is provided on this After Visit Summary.  MyChart is used to connect with patients for Virtual Visits (Telemedicine).  Patients are able to view lab/test results, encounter notes, upcoming appointments, etc.  Non-urgent  messages can be sent to your provider as well.   To learn more about what you can do with MyChart, go to [email or web address].    Your next appointment:   2 week(s) WITH EKG   Provider:   Casimiro Needle "Otilio Saber, PA-C    Other Instructions

## 2023-03-04 DIAGNOSIS — H903 Sensorineural hearing loss, bilateral: Secondary | ICD-10-CM | POA: Diagnosis not present

## 2023-03-05 ENCOUNTER — Ambulatory Visit: Payer: Medicare Other | Attending: Internal Medicine | Admitting: Internal Medicine

## 2023-03-05 VITALS — BP 108/58 | HR 94 | Ht 62.0 in | Wt 139.0 lb

## 2023-03-05 DIAGNOSIS — I4892 Unspecified atrial flutter: Secondary | ICD-10-CM | POA: Diagnosis not present

## 2023-03-05 DIAGNOSIS — I480 Paroxysmal atrial fibrillation: Secondary | ICD-10-CM | POA: Diagnosis not present

## 2023-03-05 NOTE — Patient Instructions (Signed)
Medication Instructions:  Your physician recommends that you continue on your current medications as directed. Please refer to the Current Medication list given to you today.  *If you need a refill on your cardiac medications before your next appointment, please call your pharmacy*  Lab Work: None ordered  Testing/Procedures: Your physician has recommended that you have a Cardioversion (DCCV). Electrical Cardioversion uses a jolt of electricity to your heart either through paddles or wired patches attached to your chest. This is a controlled, usually prescheduled, procedure. Defibrillation is done under light anesthesia in the hospital, and you usually go home the day of the procedure. This is done to get your heart back into a normal rhythm. You are not awake for the procedure. Please see the instruction sheet given to you today.   Follow-Up: Keep follow-up appointments as scheduled.

## 2023-03-05 NOTE — Progress Notes (Signed)
   Nurse Visit   Date of Encounter: 03/05/2023 ID: Holly Benson, DOB February 20, 1933, MRN [id number]  PCP:  Caesar Bookman, NP   [facility] Providers Cardiologist:  Chrystie Nose, MD Electrophysiologist:  Regan Lemming, MD      Visit Details   VS:  BP (!) 108/58   Pulse 94   Ht 5\' 2"  (1.575 m)   Wt 139 lb (63 kg)   SpO2 96%   BMI 25.42 kg/m  , BMI Body mass index is 25.42 kg/m.  Wt Readings from Last 3 Encounters:  03/05/23 139 lb (63 kg)  02/19/23 140 lb (63.5 kg)  02/17/23 141 lb (64 kg)     Reason for visit: EKG  Performed today: Vitals, EKG, Provider consulted: Otilio Saber, PA-C, and Education Changes (medications, testing, etc.) : None Length of Visit: 20 minutes    Medications Adjustments/Labs and Tests Ordered: No orders of the defined types were placed in this encounter.  No orders of the defined types were placed in this encounter.  Patient is here for nurse visit for repeat EKG to determine if she will need to move forward with cardioversion for a-flutter/a-tach discussed at previous office visit. Patient reports symptoms have not improved since last visit with the addition of bisoprolol. EKG reviewed by Otilio Saber, PA-C, provider discussed risk/benefits of DCCV with patient. She will proceed with DCCV scheduled for 03/12/23. Reviewed procedure instructions/location/date/time with patient.   Signed, Franchot Gallo, RN  03/05/2023 11:54 AM   Still in AFL planned for DCCV.  Riley Lam, MD FASE [facility] Cardiologist [facility]  [address] [place], [address] [place], [place] [address] [phone]  1:03 PM

## 2023-03-07 ENCOUNTER — Encounter: Payer: Self-pay | Admitting: Cardiology

## 2023-03-09 NOTE — Telephone Encounter (Signed)
Pt reports sob, fatigue and no energy. She understands that abn rhythm could be contributory towards this. Aware that she should keep scheduled DCCV this week, explanation given. She would like to wait until follow up in the [facility] clinic later this month before making any decisions/changes to treatment plan. She will call office if she feels this needs to be addressed prior to that appt. She "really appreciates the callback".

## 2023-03-10 ENCOUNTER — Other Ambulatory Visit: Payer: Self-pay

## 2023-03-10 MED ORDER — FUROSEMIDE 20 MG PO TABS: ORAL_TABLET | ORAL | 3 refills | Status: DC

## 2023-03-11 ENCOUNTER — Other Ambulatory Visit: Payer: Self-pay

## 2023-03-11 MED ORDER — FUROSEMIDE 20 MG PO TABS: ORAL_TABLET | ORAL | 1 refills | Status: DC

## 2023-03-11 NOTE — Progress Notes (Signed)
Spoke with patient, Procedure scheduled for 1000, Please arrive at the hospital at 0900, NPO after midnight on Wednesday, May take meds with sips of water in the AM, please have transportation for home post procedure, and someone to stay with pt for approximately 24 hours after 

## 2023-03-11 NOTE — Telephone Encounter (Signed)
RECEIVED REFILL REQUEST, REFILL SENT

## 2023-03-12 ENCOUNTER — Other Ambulatory Visit: Payer: Self-pay

## 2023-03-12 ENCOUNTER — Encounter (HOSPITAL_COMMUNITY)
Admission: RE | Disposition: A | Discharge status: Home / Self Care | Payer: Self-pay | Source: Home / Self Care | Attending: Cardiology

## 2023-03-12 ENCOUNTER — Encounter (HOSPITAL_COMMUNITY): Payer: Self-pay | Admitting: Certified Registered Nurse Anesthetist

## 2023-03-12 ENCOUNTER — Ambulatory Visit (HOSPITAL_COMMUNITY)
Admission: RE | Admit: 2023-03-12 | Discharge: 2023-03-12 | Disposition: A | Discharge status: Home / Self Care | Payer: Medicare Other | Attending: Cardiology | Admitting: Cardiology

## 2023-03-12 DIAGNOSIS — J449 Chronic obstructive pulmonary disease, unspecified: Secondary | ICD-10-CM | POA: Diagnosis not present

## 2023-03-12 DIAGNOSIS — E785 Hyperlipidemia, unspecified: Secondary | ICD-10-CM | POA: Diagnosis not present

## 2023-03-12 DIAGNOSIS — I4819 Other persistent atrial fibrillation: Secondary | ICD-10-CM | POA: Insufficient documentation

## 2023-03-12 DIAGNOSIS — G4733 Obstructive sleep apnea (adult) (pediatric): Secondary | ICD-10-CM | POA: Insufficient documentation

## 2023-03-12 DIAGNOSIS — Z539 Procedure and treatment not carried out, unspecified reason: Secondary | ICD-10-CM | POA: Insufficient documentation

## 2023-03-12 DIAGNOSIS — Z7901 Long term (current) use of anticoagulants: Secondary | ICD-10-CM | POA: Insufficient documentation

## 2023-03-12 DIAGNOSIS — I10 Essential (primary) hypertension: Secondary | ICD-10-CM | POA: Diagnosis not present

## 2023-03-12 DIAGNOSIS — I4892 Unspecified atrial flutter: Secondary | ICD-10-CM | POA: Insufficient documentation

## 2023-03-12 HISTORY — PX: CARDIOVERSION: SHX1299

## 2023-03-12 LAB — POCT I-STAT, CHEM 8
BUN: 28 mg/dL — ABNORMAL HIGH (ref 8–23)
Calcium, Ion: 1.26 mmol/L (ref 1.15–1.40)
Chloride: 104 mmol/L (ref 98–111)
Creatinine, Ser: 0.8 mg/dL (ref 0.44–1.00)
Glucose, Bld: 101 mg/dL — ABNORMAL HIGH (ref 70–99)
HCT: 41 % (ref 36.0–46.0)
Hemoglobin: 13.9 g/dL (ref 12.0–15.0)
Potassium: 4.1 mmol/L (ref 3.5–5.1)
Sodium: 139 mmol/L (ref 135–145)
TCO2: 25 mmol/L (ref 22–32)

## 2023-03-12 IMAGING — DX DG CHEST 2V
2 series · 2 of 2 positions shown · non-contrast
Comparison: CT chest 01/15/2021. PA and lateral chest 01/04/2021
and 10/10/2017.

CLINICAL DATA: History of pneumonia.

EXAM:
CHEST - 2 VIEW

[chest pa]
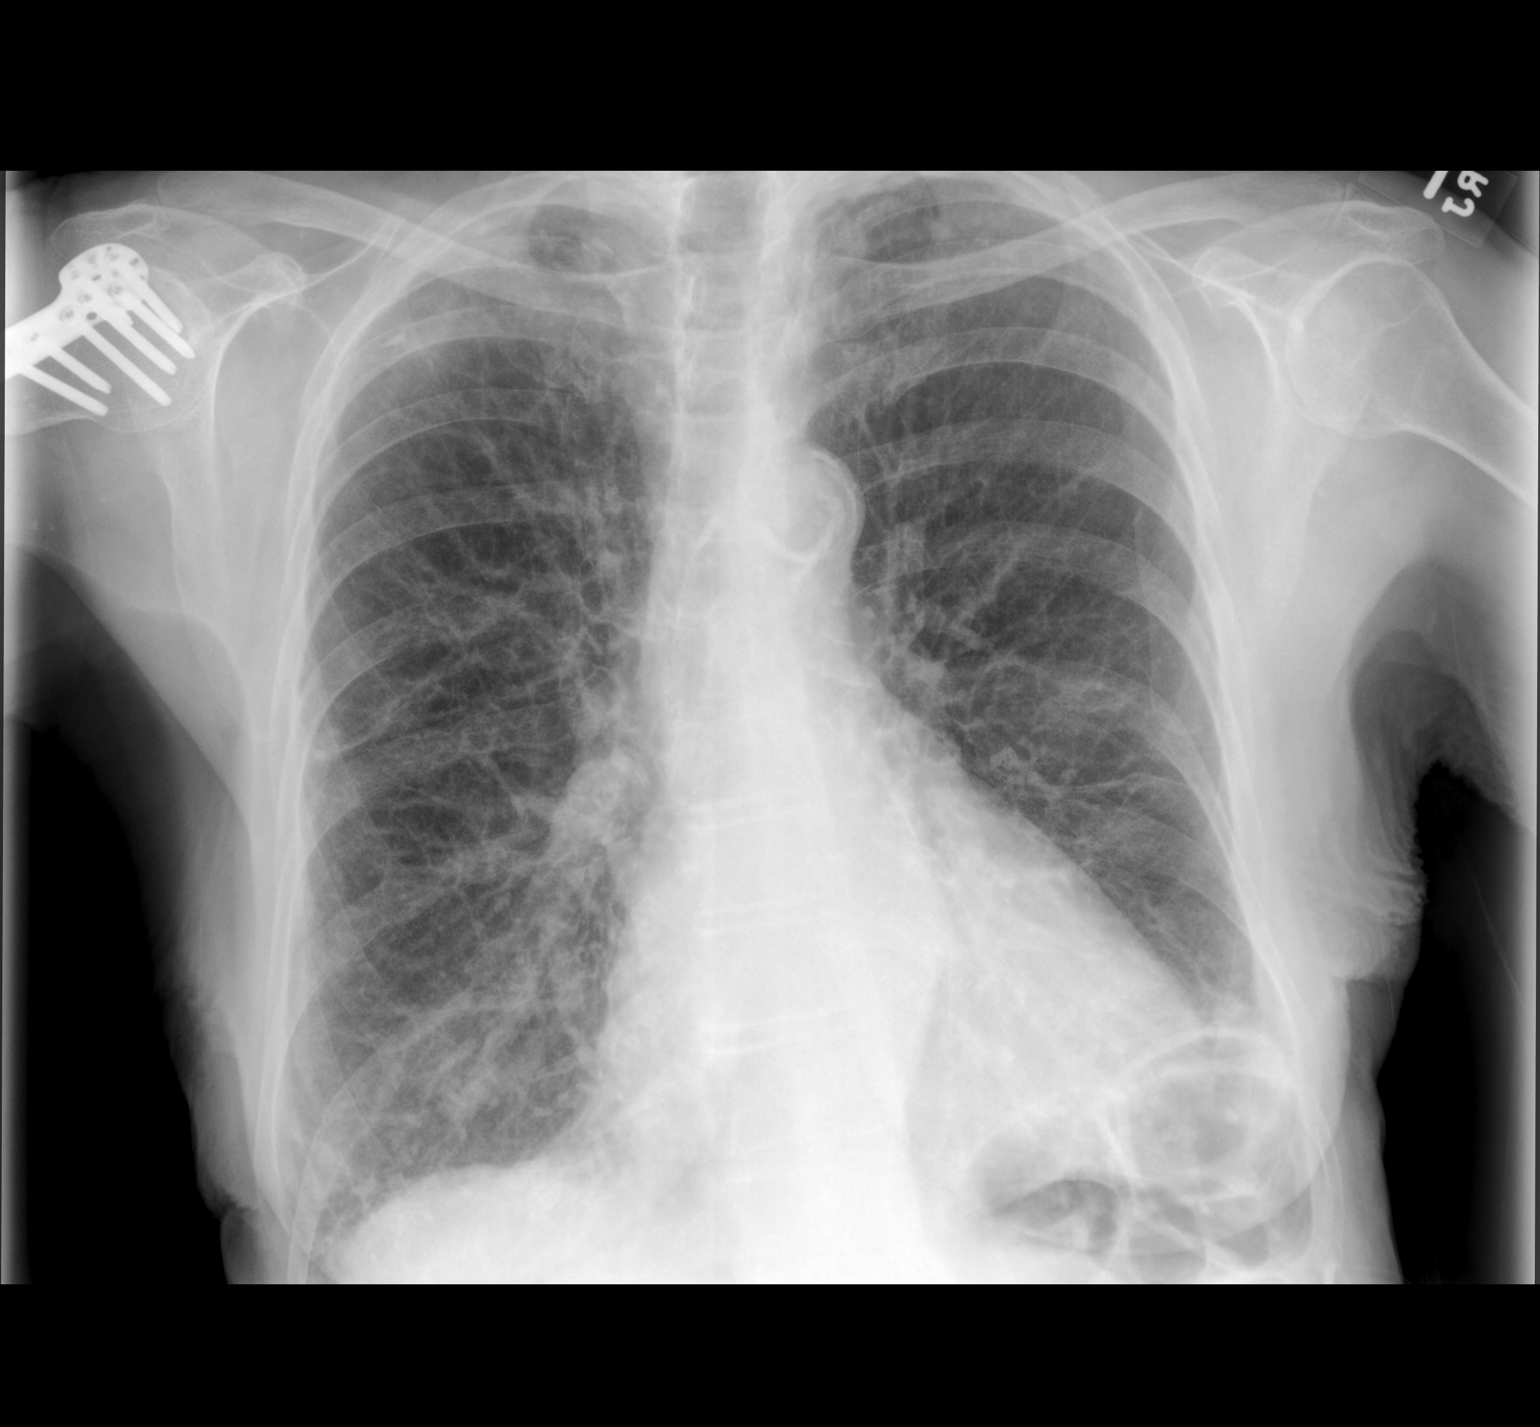

[chest lat]
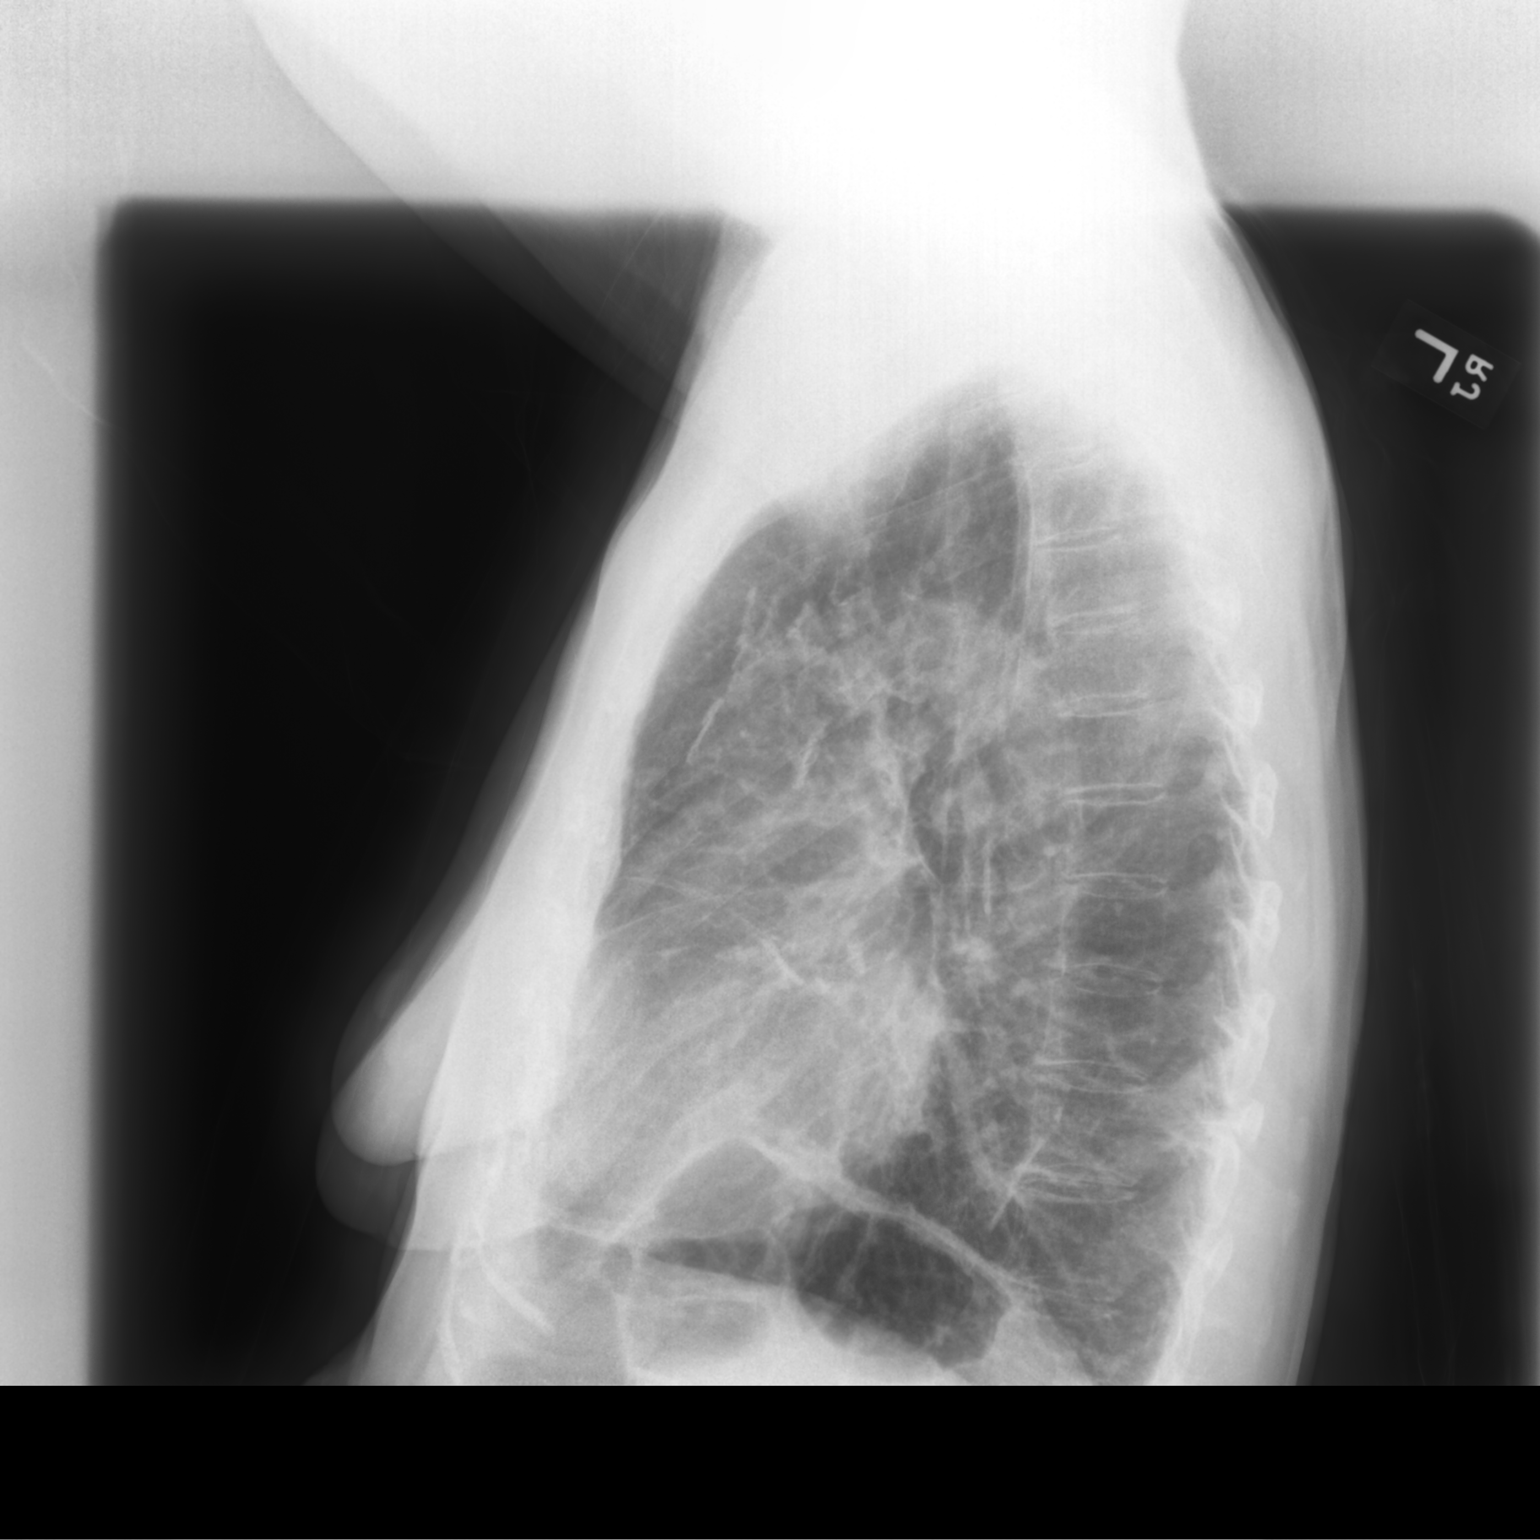

[2 of 2 positions shown; findings below may reference images not displayed]

FINDINGS: Aeration is markedly improved compared to the prior plain films.
Mild, patchy opacities are present in the lung bases and there is
some prominence of the pulmonary interstitium. Heart size is upper
normal. Extensive aortic atherosclerosis is noted. The lungs are
emphysematous. No acute bony abnormality.
IMPRESSION: Marked improvement in aeration since the prior plain films with mild
streaky opacities in the lung bases which likely represent
atelectasis or scar.

Aortic Atherosclerosis (9K9O7-0E0.0) and Emphysema (9K9O7-AF3.M).

## 2023-03-12 SURGERY — CARDIOVERSION
Anesthesia: Monitor Anesthesia Care

## 2023-03-12 MED ORDER — SODIUM CHLORIDE 0.9 % IV SOLN: INTRAVENOUS | Status: DC

## 2023-03-12 SURGICAL SUPPLY — 1 items: ELECT DEFIB PAD ADLT CADENCE (PAD) ×1 IMPLANT

## 2023-03-12 NOTE — Interval H&P Note (Signed)
History and Physical Interval Note:  03/12/2023 9:27 AM  Holly Benson  has presented today for surgery, with the diagnosis of ATRIAL FLUTTER.  The various methods of treatment have been discussed with the patient and family. After consideration of risks, benefits and other options for treatment, the patient has consented to  Procedure(s): CARDIOVERSION (N/A) as a surgical intervention.  The patient's history has been reviewed, patient examined, no change in status, stable for surgery.  I have reviewed the patient's chart and labs.  Questions were answered to the patient's satisfaction.     Armanda Magic

## 2023-03-12 NOTE — CV Procedure (Signed)
Patient found to be in normal sinus rhythm on arrival for cardioversion.  Cardioversion has been canceled.  She will follow-up with Dr. Elberta Fortis for further recommendations.

## 2023-03-12 NOTE — Progress Notes (Signed)
Cardioversion cancelled. Patient converted into SR.

## 2023-03-12 NOTE — Anesthesia Preprocedure Evaluation (Deleted)
Anesthesia Evaluation  Patient identified by MRN, date of birth, ID band Patient awake    Reviewed: Allergy & Precautions, H&P , NPO status , Patient's Chart, lab work & pertinent test results  Airway Mallampati: II   Neck ROM: full    Dental   Pulmonary shortness of breath, sleep apnea , COPD, former smoker   breath sounds clear to auscultation       Cardiovascular hypertension, + dysrhythmias Atrial Fibrillation  Rhythm:irregular Rate:Normal     Neuro/Psych  Neuromuscular disease    GI/Hepatic   Endo/Other    Renal/GU      Musculoskeletal  (+) Arthritis ,    Abdominal   Peds  Hematology   Anesthesia Other Findings   Reproductive/Obstetrics                             Anesthesia Physical Anesthesia Plan  ASA: 3  Anesthesia Plan: General   Post-op Pain Management:    Induction: Intravenous  PONV Risk Score and Plan: 3 and Propofol infusion and Treatment may vary due to age or medical condition  Airway Management Planned: Mask  Additional Equipment:   Intra-op Plan:   Post-operative Plan:   Informed Consent: I have reviewed the patients History and Physical, chart, labs and discussed the procedure including the risks, benefits and alternatives for the proposed anesthesia with the patient or authorized representative who has indicated his/her understanding and acceptance.     Dental advisory given  Plan Discussed with: CRNA, Anesthesiologist and Surgeon  Anesthesia Plan Comments:        Anesthesia Quick Evaluation

## 2023-03-13 ENCOUNTER — Encounter (HOSPITAL_COMMUNITY): Payer: Self-pay | Admitting: Cardiology

## 2023-03-24 ENCOUNTER — Ambulatory Visit (HOSPITAL_COMMUNITY)
Admission: RE | Admit: 2023-03-24 | Discharge: 2023-03-24 | Disposition: A | Discharge status: Home / Self Care | Payer: Medicare Other | Source: Ambulatory Visit | Attending: Physician Assistant | Admitting: Physician Assistant

## 2023-03-24 VITALS — BP 152/68 | HR 64 | Ht 62.0 in | Wt 144.6 lb

## 2023-03-24 DIAGNOSIS — I083 Combined rheumatic disorders of mitral, aortic and tricuspid valves: Secondary | ICD-10-CM | POA: Insufficient documentation

## 2023-03-24 DIAGNOSIS — G4733 Obstructive sleep apnea (adult) (pediatric): Secondary | ICD-10-CM | POA: Diagnosis not present

## 2023-03-24 DIAGNOSIS — I4819 Other persistent atrial fibrillation: Secondary | ICD-10-CM | POA: Diagnosis not present

## 2023-03-24 DIAGNOSIS — I110 Hypertensive heart disease with heart failure: Secondary | ICD-10-CM | POA: Diagnosis not present

## 2023-03-24 DIAGNOSIS — D6869 Other thrombophilia: Secondary | ICD-10-CM | POA: Diagnosis not present

## 2023-03-24 DIAGNOSIS — I5032 Chronic diastolic (congestive) heart failure: Secondary | ICD-10-CM | POA: Diagnosis not present

## 2023-03-24 LAB — BASIC METABOLIC PANEL WITH GFR
Anion gap: 9 (ref 5–15)
BUN: 27 mg/dL — ABNORMAL HIGH (ref 8–23)
CO2: 26 mmol/L (ref 22–32)
Calcium: 9.4 mg/dL (ref 8.9–10.3)
Chloride: 99 mmol/L (ref 98–111)
Creatinine, Ser: 0.85 mg/dL (ref 0.44–1.00)
GFR, Estimated: >60 mL/min
Glucose, Bld: 98 mg/dL (ref 70–99)
Potassium: 4.7 mmol/L (ref 3.5–5.1)
Sodium: 134 mmol/L — ABNORMAL LOW (ref 135–145)

## 2023-03-24 LAB — BRAIN NATRIURETIC PEPTIDE: B Natriuretic Peptide: 891.4 pg/mL — ABNORMAL HIGH (ref 0.0–100.0)

## 2023-03-24 LAB — MAGNESIUM: Magnesium: 2.5 mg/dL — ABNORMAL HIGH (ref 1.7–2.4)

## 2023-03-24 MED ORDER — POTASSIUM CHLORIDE CRYS ER 10 MEQ PO TBCR: 10.0000 meq | EXTENDED_RELEASE_TABLET | Freq: Every day | ORAL | 0 refills | Status: DC

## 2023-03-24 NOTE — Patient Instructions (Addendum)
Increase lasix to 40mg  daily for 3 days then back to normal dosing  Potassium daily for the 3 days taking higher dose of lasix

## 2023-03-24 NOTE — Progress Notes (Signed)
Primary Care Physician: Caesar Bookman, NP Primary Cardiologist: Dr Rennis Golden Primary Electrophysiologist: Dr Elberta Fortis Referring Physician: Dr Alfonse Ras Whipkey is a 89 y.o. female with a history of HTN, atrial flutter, HLD, OSA, COPD, PAD, and atrial fibrillation who presents for follow up in the [facility]. She was previously on amiodarone but this was discontinued due to concern about possible lung toxicity. She states that over the last few weeks she has had frequent "fluttering" sensations in her chest which last 10-15 minutes but can happen multiple times per day. She states that this feels "totally different" from her afib in the past. She denies any other symptoms. Patient is on Eliquis for a CHADS2VASC score of 5.  When she was seen July 2022, tikosyn was discussed but pt was not ready to commit. Now s/p dofetilide admission 4/18-4/21/23. She was found to be in atrial flutter at her visit on 02/19/23 and was scheduled for DCCV on 5/9 but she arrived in [place] and the procedure was cancelled.   On follow up today, patient reports that she feels well from an afib standpoint with only very brief palpitations. However, she has noted a 6 lbs weight gain over the past few weeks associated with increased lower extremity edema and fatigue. No bleeding issues on anticoagulation.    Today, she denies symptoms of palpitations, chest pain, orthopnea, PND, dizziness, presyncope, syncope, snoring, daytime somnolence, bleeding, or neurologic sequela. The patient is tolerating medications without difficulties and is otherwise without complaint today.    Atrial Fibrillation Risk Factors:  she does have symptoms or diagnosis of sleep apnea. she is compliant with CPAP therapy. she does not have a history of rheumatic fever.   she has a BMI of Body mass index is 26.45 kg/m.Marland Kitchen Filed Weights   03/24/23 1348  Weight: 65.6 kg    Family History  Problem Relation Age of  Onset   Suicidality Father    Stroke Maternal Grandfather    Hypertension Sister    Colon cancer Neg Hx    Esophageal cancer Neg Hx    Pancreatic cancer Neg Hx    Stomach cancer Neg Hx    Liver disease Neg Hx      Atrial Fibrillation Management history:  Previous antiarrhythmic drugs: flecainide, amiodarone, dofetilide   Previous cardioversions: none Previous ablations: none CHADS2VASC score: 5 Anticoagulation history: Eliquis   Past Medical History:  Diagnosis Date   Arrhythmia    Arthritis    Atrial fibrillation (HCC)    Constipation    chronic   COPD (chronic obstructive pulmonary disease) (HCC)    Hyperlipidemia    Hypertension    pulmonary   OSA on CPAP    Osteoarthritis    Osteoporosis    Peripheral neuropathy    Pulmonary hypertension (HCC)    RLS (restless legs syndrome)    Sleep apnea    Past Surgical History:  Procedure Laterality Date   ABDOMINAL AORTOGRAM W/LOWER EXTREMITY N/A 03/21/2020   Procedure: ABDOMINAL AORTOGRAM W/ Bilateral LOWER EXTREMITY Runoff;  Surgeon: Iran Ouch, MD;  Location: [facility];  Service: Cardiovascular;  Laterality: N/A;   ABDOMINAL HYSTERECTOMY Bilateral    arm surgery Right    Broken arm and has a plate in it   CARDIOVERSION N/A 03/12/2023   Procedure: CARDIOVERSION;  Surgeon: Quintella Reichert, MD;  Location: [facility];  Service: Cardiovascular;  Laterality: N/A;   CATARACT EXTRACTION Bilateral    COLONOSCOPY  More than 10 years ago In [facility]   KNEE ARTHROSCOPY Right    PERIPHERAL VASCULAR INTERVENTION Bilateral 03/21/2020   Procedure: PERIPHERAL VASCULAR INTERVENTION;  Surgeon: Iran Ouch, MD;  Location: [facility];  Service: Cardiovascular;  Laterality: Bilateral;  external iliac   TONSILLECTOMY      Current Outpatient Medications  Medication Sig Dispense Refill   acetaminophen (TYLENOL) 325 MG tablet Take 650 mg by mouth as needed for moderate pain or headache.      albuterol (VENTOLIN HFA) 108 (90 Base) MCG/ACT inhaler Inhale 2 puffs into the lungs every 6 (six) hours as needed for wheezing or shortness of breath. 18 g 6   amLODipine (NORVASC) 2.5 MG tablet Take 1 tablet (2.5 mg total) by mouth daily. 90 tablet 3   amLODipine (NORVASC) 5 MG tablet Take 1 tablet (5 mg total) by mouth daily. (Patient taking differently: Take 5 mg by mouth See admin instructions. Take with 2.5 mg in the morning for a total of 7.5 mg daily) 30 tablet 7   apixaban (ELIQUIS) 5 MG TABS tablet Take 1 tablet (5 mg total) by mouth 2 (two) times daily. 180 tablet 1   bisoprolol (ZEBETA) 10 MG tablet Take 1 tablet (10 mg total) by mouth at bedtime. 90 tablet 1   Cholecalciferol (VITAMIN D3 MAXIMUM STRENGTH) 125 MCG (5000 UT) capsule Take 5,000 Units by mouth daily.     clonazePAM (KLONOPIN) 0.5 MG tablet Take 0.5 tablets (0.25 mg total) by mouth at bedtime as needed for anxiety. (Patient taking differently: Take 0.25 mg by mouth at bedtime as needed (Restless leg).) 30 tablet 2   docusate sodium (COLACE) 100 MG capsule Take 300 mg by mouth at bedtime.     dofetilide (TIKOSYN) 250 MCG capsule Take 1 capsule (250 mcg total) by mouth 2 (two) times daily. 180 capsule 3   furosemide (LASIX) 20 MG tablet Take one tablet by mouth on Monday, Wednesday and Friday 36 tablet 1   irbesartan (AVAPRO) 300 MG tablet Take 1 tablet (300 mg total) by mouth daily. 90 tablet 1   Misc Natural Products (COLON CLEANSE) CAPS Take 4 capsules by mouth as needed (constipation).     Multiple Vitamins-Minerals (PRESERVISION AREDS 2) CAPS Take 1 tablet by mouth 2 (two) times daily.     polyethylene glycol (MIRALAX / GLYCOLAX) 17 g packet Take 17 g by mouth at bedtime.     potassium chloride (KLOR-CON M) 10 MEQ tablet Take 1 tablet (10 mEq total) by mouth daily for 3 days. 3 tablet 0   PRESCRIPTION MEDICATION at bedtime as needed (Sometimes). CPAP     rOPINIRole (REQUIP) 0.5 MG tablet One in AM and one after 3 Pm and  one after dinner 8 Pm (Patient taking differently: Take 0.5 mg by mouth in the morning, at noon, in the evening, and at bedtime.) 90 tablet 5   Tiotropium Bromide-Olodaterol (STIOLTO RESPIMAT) 2.5-2.5 MCG/ACT AERS USE 2 INHALATIONS ORALLY   EVERY MORNING 12 g 3   No current facility-administered medications for this encounter.   Facility-Administered Medications Ordered in Other Encounters  Medication Dose Route Frequency Provider Last Rate Last Admin   technetium tetrofosmin (TC-MYOVIEW) injection 31.9 millicurie  31.9 millicurie Intravenous Once PRN Chilton Si, MD        Allergies  Allergen Reactions   Lovenox [Enoxaparin Sodium] Hives and Rash    PT STATES SHE BROKE OUT IN A RASH HEAD TO TOE AND LASTED ABOUT 3 WEEKS  Metoprolol Succinate [Metoprolol] Other (See Comments)    Muscle aches, hand pain, and tingling    Social History   Socioeconomic History   Marital status: Widowed    Spouse name: Not on file   Number of children: 3   Years of education: Not on file   Highest education level: Not on file  Occupational History   Occupation: retired    Comment: retired Comptroller  Tobacco Use   Smoking status: Former    Packs/day: 1.50    Years: 30.00    Additional pack years: 0.00    Total pack years: 45.00    Types: Cigarettes    Start date: 53    Quit date: 11/04/1991    Years since quitting: 31.4   Smokeless tobacco: Never   Tobacco comments:    Former smoker 03/14/22  Vaping Use   Vaping Use: Never used  Substance and Sexual Activity   Alcohol use: Yes    Alcohol/week: 7.0 standard drinks of alcohol    Types: 7 Glasses of wine per week    Comment: 1 glass of wine nightly 03/14/22   Drug use: No   Sexual activity: Not Currently  Other Topics Concern   Not on file  Social History Narrative   Lives with daughter   Social Determinants of Health   Financial Resource Strain: Not on file  Food Insecurity: Not on file  Transportation Needs: Not on file   Physical Activity: Not on file  Stress: Not on file  Social Connections: Not on file  Intimate Partner Violence: Not on file     ROS- All systems are reviewed and negative except as per the HPI above.  Physical Exam: Vitals:   03/24/23 1348  BP: (!) 152/68  Pulse: 64  Weight: 65.6 kg  Height: 5\' 2"  (1.575 m)    GEN- The patient is a well appearing elderly female, alert and oriented x 3 today.   HEENT-head normocephalic, atraumatic, sclera clear, conjunctiva pink, hearing intact, trachea midline. Lungs- Clear to ausculation bilaterally, normal work of breathing Heart- Regular rate and rhythm, no murmurs, rubs or gallops  GI- soft, NT, ND, + BS Extremities- no clubbing, cyanosis, 1+ bilateral lower extremity edema MS- no significant deformity or atrophy Skin- no rash or lesion Psych- euthymic mood, full affect Neuro- strength and sensation are intact   Wt Readings from Last 3 Encounters:  03/24/23 65.6 kg  03/12/23 62.6 kg  03/05/23 63 kg    EKG today demonstrates  SR Vent. rate 64 BPM PR interval 170 ms QRS duration 90 ms QT/QTcB 452/466 ms   Epic records are reviewed at length today   Echo 10/22/21 1. Left ventricular ejection fraction, by estimation, is 65 to 70%. The left ventricle has normal function. The left ventricle has no regional wall motion abnormalities. Left ventricular diastolic function could not be evaluated. The average left ventricular global longitudinal strain is -10.5 %. The global longitudinal strain is abnormal.   2. Right ventricular systolic function is moderately reduced. The right  ventricular size is moderately enlarged. There is mildly elevated  pulmonary artery systolic pressure. The estimated right ventricular  systolic pressure is 39.6 mmHg.   3. Left atrial size was severely dilated.   4. Right atrial size was severely dilated.   5. The mitral valve is normal in structure. Mild to moderate mitral valve regurgitation. No evidence  of mitral stenosis.   6. Tricuspid valve regurgitation is severe.   7. The aortic valve is normal  in structure. Aortic valve regurgitation is  mild. No aortic stenosis is present.   8. The inferior vena cava is dilated in size with >50% respiratory  variability, suggesting right atrial pressure of 8 mmHg.   Comparison(s): EF 60%, RA & LA severe dilated, moderate TR.    CHA2DS2-VASc Score = 6  The patient's score is based upon: CHF History: 1 HTN History: 1 Diabetes History: 0 Stroke History: 0 Vascular Disease History: 1 Age Score: 2 Gender Score: 1        ASSESSMENT AND PLAN: 1. Persistent Atrial Fibrillation (ICD10:  I48.19) The patient's CHA2DS2-VASc score is 6, indicating a 9.7% annual risk of stroke.   Failed flecainide and amiodarone S/p dofetilide loading 4/18-4/21/23 Patient back in SR.  Continue dofetilide 250 mcg BID. QT stable. Check bmet/mag today.  Continue Eliquis  5 mg BID Continue bisoprolol 10 mg daily  2. Secondary Hypercoagulable State (ICD10:  D68.69) The patient is at significant risk for stroke/thromboembolism based upon her CHA2DS2-VASc Score of 6.  Continue Apixaban (Eliquis).   3. HTN Stable, no changes today.  4. Obstructive sleep apnea Encouraged compliance with CPAP therapy.  5. HFpEF NYHA class II symptoms Will check BNP today. Patient interested in Alleviate HF if she meets criteria. Increase Lasix to 40 mg daily x 3 days then back to her usual dosing.  Will give her KCL 10 meq to take with the increased Lasix dose.    Follow up in the AF clinic in 6 months.    Jorja Loa PA-C [facility] [address] [place], [place] [address] [phone]

## 2023-03-30 ENCOUNTER — Encounter: Payer: Self-pay | Admitting: Cardiology

## 2023-03-31 DIAGNOSIS — H903 Sensorineural hearing loss, bilateral: Secondary | ICD-10-CM | POA: Diagnosis not present

## 2023-04-12 NOTE — Progress Notes (Unsigned)
Cardiology Clinic Note   Patient Name: Holly Benson Date of Encounter: 04/12/2023  Primary Care Provider:  Caesar Bookman, NP Primary Cardiologist:  Chrystie Nose, MD  Patient Profile    Holly Benson 89 year old female presents the clinic today for follow-up evaluation of her essential hypertension and atrial fibrillation.  Past Medical History    Past Medical History:  Diagnosis Date   Arrhythmia    Arthritis    Atrial fibrillation (HCC)    Constipation    chronic   COPD (chronic obstructive pulmonary disease) (HCC)    Hyperlipidemia    Hypertension    pulmonary   OSA on CPAP    Osteoarthritis    Osteoporosis    Peripheral neuropathy    Pulmonary hypertension (HCC)    RLS (restless legs syndrome)    Sleep apnea    Past Surgical History:  Procedure Laterality Date   ABDOMINAL AORTOGRAM W/LOWER EXTREMITY N/A 03/21/2020   Procedure: ABDOMINAL AORTOGRAM W/ Bilateral LOWER EXTREMITY Runoff;  Surgeon: Iran Ouch, MD;  Location: [facility];  Service: Cardiovascular;  Laterality: N/A;   ABDOMINAL HYSTERECTOMY Bilateral    arm surgery Right    Broken arm and has a plate in it   CARDIOVERSION N/A 03/12/2023   Procedure: CARDIOVERSION;  Surgeon: Quintella Reichert, MD;  Location: [facility];  Service: Cardiovascular;  Laterality: N/A;   CATARACT EXTRACTION Bilateral    COLONOSCOPY     More than 10 years ago In [facility]   KNEE ARTHROSCOPY Right    PERIPHERAL VASCULAR INTERVENTION Bilateral 03/21/2020   Procedure: PERIPHERAL VASCULAR INTERVENTION;  Surgeon: Iran Ouch, MD;  Location: [facility];  Service: Cardiovascular;  Laterality: Bilateral;  external iliac   TONSILLECTOMY      Allergies  Allergies  Allergen Reactions   Lovenox [Enoxaparin Sodium] Hives and Rash    PT STATES SHE BROKE OUT IN A RASH HEAD TO TOE AND LASTED ABOUT 3 WEEKS    Metoprolol Succinate [Metoprolol] Other (See Comments)    Muscle aches, hand pain,  and tingling    History of Present Illness    Holly Benson has a PMH of hypertension, atrial flutter, hyperlipidemia, obstructive sleep apnea, COPD, peripheral arterial disease, and atrial fibrillation.  CHA2DS2-VASc score 5 on Eliquis.   She was  seen by Alphonzo Severance, PA-C on 05/17/2021.  Of note she was previously on amiodarone which was discontinued due to lung toxicity.  When she was seen in the atrial fibrillation clinic she described a frequent fluttering type sensation which would last for 10 to 15 minutes and happen multiple times per day.  She was started on diltiazem 30 mg daily.   She contacted the nurse triage line 07/11/2021.  Her metoprolol was transitioned to carvedilol.  With metoprolol she did notice tingling and numbness with an icy type pain in her thumb and first 2 fingers on each hand.   She presented to the clinic 07/15/2021 for follow-up evaluation stated she was out of town for several days and noticed that her blood pressure was low.  She reported that she stopped taking her afternoon dose of hydralazine and did not notice much change with her blood pressure.  She discontinued her morning dose of hydralazine as well.  She noticed a pain in her hands and tingling with metoprolol.  She also noticed some occasional ankle edema.  I changed her metoprolol to carvedilol, gave her the Donora support stocking sheet, discontinued her  hydralazine, gave her the salty 6 diet sheet, asked her to maintain her physical activity, and planned follow-up in 1 to 2 months.  Have asked her to maintain a blood pressure log.   She reported that she contacted the office earlier last week for follow-up appointment.  She was told that she would not be scheduled for an appointment with Dr. Rennis Golden for several months.  She contacted the patient advocate representatives with her complaint.  She was added onto my schedule today.   She followed up with [name] PA-C on 08/06/2021.  She had contacted the  office with reports of increased palpitations.  She questioned if she was having more episodes of atrial fibrillation.  She presented to the clinic alone.  She reported that she did not want to distinguish between PACs and paroxysmal atrial fibrillation.  She noted palpitations at least once daily that would last for around 30 minutes.  She denied dizziness, presyncope, syncope with the episodes.  She had not taken her as needed Cardizem.  She was maintaining a blood pressure log.  Her systolic blood pressures were in the 80- 100s.  She denied bleeding issues.  She was tolerating carvedilol well.   She presented to the clinic 10/08/2021 for follow-up evaluation stated over the last week she had noticed increased shortness of breath and had has been taking her diltiazem more regularly due to increased heart rate.  Her blood pressures had been slightly elevated.  In  clinic her heart rate was 88 with a blood pressure of 116/60.  She reported that she  had some increased phlegm and has just returned from seeing her family.  She had a call from her pharmacist who lowered her apixaban dosing due to her weight.  She had regained weight.  I  increased her apixaban dosing back to 5 mg twice daily.  I will ordered an echocardiogram, CBC, and scheduled follow-up for February.   She was seen by Otilio Saber, PA-C 10/10/2021.  During that time she reported that she was doing okay overall.  She noted shortness of breath and fatigue x10 days.  It was felt that it was likely related to her atrial fibrillation.  Her heart rates at home were 80-1 100s.  Her weight was slightly increased and she noted more ankle swelling.  She was prescribed furosemide 20 mg as needed for lower extremity swelling.   She was seen by Dr. Okey Regal 10/22/2021.  At that time she was interested in starting Tikosyn in February because she was planning a trip to [place] for the month of January.  Her weight remains stable and her heart rate was rate  controlled.   She presented to the clinic 12/12/21 for follow-up evaluation and states she had a nice trip to [facility].  She reported that someone told her to stop taking her diltiazem.  She had not taken the medication in around 1 and half months.  She reported that her heart rate had been 90s and low 100s.  She felt that her shortness of breath was stable.  We reviewed the medication and recommendations for starting Tikosyn.  I have asked her to follow-up with the atrial fibrillation clinic, I prescribed 60 mg of extended release diltiazem, and plan follow-up for 3 to 4 months.  She was noted to have changes on CT scan noted in her lungs.  Her amiodarone was stopped.  She underwent load of dofetilide.  She followed up with Dr. Elberta Fortis 12/01/2022.  She denied chest  pain and shortness of breath.  She denied dizziness presyncope and syncope.  She was tolerating medications well.  She denied further episodes of atrial fibrillation.  She underwent repair of ventral hernia and reported that her appetite was getting better.  Chest Vascor was noted to be 4.  She was continued on apixaban, metoprolol and follow-up was planned for 6 months.  She presented to the clinic 01/01/2023 for follow-up evaluation and stated she had been noticing weakness, muscle aches, and shortness of breath over the last 6 weeks.  Her blood pressures had been elevated in the morning and stabilized throughout the day.  We reviewed her previous echocardiogram and last clinic visit.  She expressed understanding.  She felt that she had recovered fairly well since having her hernia repair.  However, she had not been able to return to her baseline physical activity since her surgery.  She had been eating better and her weight was slightly increased.  I  ordered a CBC and echocardiogram.  Will plan follow-up in 1 month - 2 months.  She planned to travel to [facility] to see a glass blowing exhibit in the near future.  Echocardiogram 02/03/2023 showed  normal EF and intermediate diastolic parameters.  CBC was stable.  She presented to the clinic 02/17/2023 for follow-up evaluation and stated her blood pressure had been better controlled.  She still had some weakness and fatigue.  She felt this may be related to her beta-blocker.  We discussed her previous medications and atrial fibrillation.  I explained that  EP would be able to offer recommendations during her appointment in 2 days.  She expressed understanding.  She enjoyed her trip to [place].  She had been somewhat physically active and continued to do yoga.  I refilled her amlodipine and planned follow-up in 6 months.   She was seen in follow-up by Otilio Saber 02/19/2023.  Her atenolol was discontinued and she was started on bisoprolol.  DCCV was also discussed.  She was seen for follow-up EKG on 03/05/2023.  Her symptoms continued.  Her EKG showed atrial flutter 94 bpm.  She was scheduled for DCCV on 03/12/2023.  On presentation for DCCV she was in sinus rhythm.  Her cardioversion was canceled.  She presents to the clinic today for follow-up evaluation and states***.    Today she denies chest pain, lower extremity edema,  melena, hematuria, hemoptysis, diaphoresis, weakness, presyncope, syncope, orthopnea, and PND.   Cardiomyopathy-NYHA class II.  Weight today 141***pounds.  Recent echocardiogram showed normal EF with intermediate diastolic parameters.  Details above.  Echocardiogram 02/03/23 showed EF of 60-65%, intermediate diastolic parameters, mild mitral valve regurgitation, and moderate tricuspid valve regurgitation.  Her right atrium was severely dilated and left atrium was mildly dilated.    Stress testing 04/04/2022 showed no ischemia and low risk.   Continue current medication regimen and diet.   Essential hypertension-BP today***123/74.  Improvement in blood pressure since increasing amlodipine.   Continue bisoprolol, irbesartan, furosemide, amlodipine Continue increased amlodipine to  7.5 mg daily Heart healthy low-sodium diet Increase physical activity as tolerated  Paroxysmal atrial fibrillation-heart rate 88***bpm.  Regular.  Continues to have intermittent episodes of palpitations.  Recently seen by EP.  Noted to be in atrial flutter.  Her atenolol was send to bisoprolol.  Was scheduled for DCCV.  On arrival for cardioversion she was noted to be in sinus rhythm.  CHA2DS2-VASc score 5.  No bleeding issues.  Continue Tikosyn, bisoprolol continue apixaban to 5 mg twice daily Avoid  triggers-reviewed Follows with A-fib clinic, EP  Obstructive sleep apnea-continue compliance with CPAP.   Continue CPAP use Elevate head of bed, avoid supine sleeping.   Disposition: Follow-up with Dr. Rennis Golden or me in 6 months .  Home Medications    Prior to Admission medications   Medication Sig Start Date End Date Taking? Authorizing Provider  acetaminophen (TYLENOL) 325 MG tablet Take 650 mg by mouth as needed for moderate pain or headache.    [provider]  albuterol (VENTOLIN HFA) 108 (90 Base) MCG/ACT inhaler Inhale 2 puffs into the lungs every 6 (six) hours as needed for wheezing or shortness of breath. 03/10/22   Parrett, Virgel Bouquet, NP  amLODipine (NORVASC) 5 MG tablet Take 1 tablet (5 mg total) by mouth daily. 12/01/22   Camnitz, Andree Coss, MD  apixaban (ELIQUIS) 5 MG TABS tablet Take 1 tablet (5 mg total) by mouth 2 (two) times daily. 12/10/22   Camnitz, Andree Coss, MD  atenolol (TENORMIN) 50 MG tablet Take 1 tablet (50 mg total) by mouth daily. 12/02/22   Hilty, Lisette Abu, MD  clonazePAM (KLONOPIN) 0.5 MG tablet Take 0.25 mg by mouth at bedtime as needed for anxiety.    [provider]  docusate sodium (COLACE) 100 MG capsule Take 300 mg by mouth at bedtime.    [provider]  dofetilide (TIKOSYN) 250 MCG capsule Take 1 capsule (250 mcg total) by mouth 2 (two) times daily. 08/14/22   Sheilah Pigeon, PA-C  furosemide (LASIX) 20 MG tablet Take one tablet by  mouth on Monday, Wednesday and Friday 02/28/22   Fenton, Clint R, PA  irbesartan (AVAPRO) 300 MG tablet Take 1 tablet (300 mg total) by mouth daily. 11/12/22   Hilty, Lisette Abu, MD  Misc Natural Products (COLON CLEANSE) CAPS Take 4 capsules by mouth as needed (constipation).    [provider]  OVER THE COUNTER MEDICATION Take 3 capsules by mouth daily. [place], Bone Strength    [provider]  Polyethyl Glycol-Propyl Glycol (SYSTANE) 0.4-0.3 % SOLN Place 1 drop into both eyes as needed.    [provider]  polyethylene glycol (MIRALAX / GLYCOLAX) 17 g packet Take 17 g by mouth 2 (two) times daily.    [provider]  PRESCRIPTION MEDICATION at bedtime. CPAP    [provider]  rOPINIRole (REQUIP) 0.25 MG tablet Take 1 tablet in the morning and three tablets after dinner po 02/13/22   Dohmeier, Porfirio Mylar, MD  Tiotropium Bromide-Olodaterol (STIOLTO RESPIMAT) 2.5-2.5 MCG/ACT AERS USE 2 INHALATIONS ORALLY   EVERY MORNING 05/30/22   Byrum, Les Pou, MD  Vitamin D-Vitamin K (VITAMIN K2-VITAMIN D3 PO) Take 1 Capful by mouth daily.    [provider]    Family History    Family History  Problem Relation Age of Onset   Suicidality Father    Stroke Maternal Grandfather    Hypertension Sister    Colon cancer Neg Hx    Esophageal cancer Neg Hx    Pancreatic cancer Neg Hx    Stomach cancer Neg Hx    Liver disease Neg Hx    She indicated that her mother is deceased. She indicated that her father is deceased. She indicated that her sister is alive. She indicated that her maternal grandfather is deceased. She indicated that the status of her neg hx is unknown.  Social History    Social History   Socioeconomic History   Marital status: Widowed    Spouse name: Not  on file   Number of children: 3   Years of education: Not on file   Highest education level: Not on file  Occupational History   Occupation: retired    Comment: retired Comptroller   Tobacco Use   Smoking status: Former    Packs/day: 1.50    Years: 30.00    Additional pack years: 0.00    Total pack years: 45.00    Types: Cigarettes    Start date: 26    Quit date: 11/04/1991    Years since quitting: 31.4   Smokeless tobacco: Never   Tobacco comments:    Former smoker 03/14/22  Vaping Use   Vaping Use: Never used  Substance and Sexual Activity   Alcohol use: Yes    Alcohol/week: 7.0 standard drinks of alcohol    Types: 7 Glasses of wine per week    Comment: 1 glass of wine nightly 03/14/22   Drug use: No   Sexual activity: Not Currently  Other Topics Concern   Not on file  Social History Narrative   Lives with daughter   Social Determinants of Health   Financial Resource Strain: Not on file  Food Insecurity: Not on file  Transportation Needs: Not on file  Physical Activity: Not on file  Stress: Not on file  Social Connections: Not on file  Intimate Partner Violence: Not on file     Review of Systems    General:  No chills, fever, night sweats or weight changes.  Cardiovascular:  No chest pain, dyspnea on exertion, edema, orthopnea, palpitations, paroxysmal nocturnal dyspnea. Dermatological: No rash, lesions/masses Respiratory: No cough, dyspnea Urologic: No hematuria, dysuria Abdominal:   No nausea, vomiting, diarrhea, bright red blood per rectum, melena, or hematemesis Neurologic:  No visual changes, wkns, changes in mental status. All other systems reviewed and are otherwise negative except as noted above.  Physical Exam    VS:  There were no vitals taken for this visit. , BMI There is no height or weight on file to calculate BMI. GEN: Well nourished, well developed, in no acute distress. HEENT: normal. Neck: Supple, no JVD, carotid bruits, or masses. Cardiac: RRR, no murmurs, rubs, or gallops. No clubbing, cyanosis, bilateral ankle edema.  Radials/DP/PT 2+ and equal bilaterally.  Respiratory:  Respirations regular and unlabored, clear to  auscultation bilaterally.   GI: Soft, nontender, nondistended, BS + x 4. MS: no deformity or atrophy.   Skin: warm and dry, no rash. Neuro:  Strength and sensation are intact. Psych: Normal affect.  Accessory Clinical Findings    Recent Labs: 05/26/2022: ALT 17; TSH 2.09 01/01/2023: Platelets 306 03/12/2023: Hemoglobin 13.9 03/24/2023: B Natriuretic Peptide 891.4; BUN 27; Creatinine, Ser 0.85; Magnesium 2.5; Potassium 4.7; Sodium 134   Recent Lipid Panel    Component Value Date/Time   CHOL 231 (H) 05/26/2022 1046   CHOL 149 04/11/2020 0838   TRIG 98 05/26/2022 1046   HDL 86 05/26/2022 1046   HDL 69 04/11/2020 0838   CHOLHDL 2.7 05/26/2022 1046   LDLCALC 125 (H) 05/26/2022 1046    No BP recorded.  {Refresh Note OR Click here to enter BP  :1}***    ECG personally reviewed by me today-none today.  Nuclear stress testing 04/04/2022    The study is normal. Findings are consistent with no prior ischemia and no prior myocardial infarction. The study is low risk.   No ST deviation was noted.   LV perfusion is normal. There is no evidence of ischemia. There is  no evidence of infarction.   Left ventricular function is normal. Nuclear stress EF: 64 %. The left ventricular ejection fraction is normal (55-65%). End diastolic cavity size is normal. End systolic cavity size is normal.   Prior study available for comparison from 12/08/2018.  Echocardiogram 04/17/2022  IMPRESSIONS     1. Global longitudinal strain is -17.1% Improved from echo in Dec 2022.  Left ventricular ejection fraction, by estimation, is 65 to 70%. The left  ventricle has normal function. The left ventricle has no regional wall  motion abnormalities. Left  ventricular diastolic parameters were normal.   2. Right ventricular systolic function is mildly reduced. The right  ventricular size is normal. There is moderately elevated pulmonary artery  systolic pressure.   3. Right atrial size was severely dilated.   4. The  mitral valve is normal in structure. Mild mitral valve  regurgitation.   5. Tricuspid valve regurgitation is mild to moderate.   6. The aortic valve is tricuspid. Aortic valve regurgitation is mild.  Aortic valve sclerosis is present, with no evidence of aortic valve  stenosis.   FINDINGS   Left Ventricle: Global longitudinal strain is -17.1% Improved from echo  in Dec 2022. Left ventricular ejection fraction, by estimation, is 65 to  70%. The left ventricle has normal function. The left ventricle has no  regional wall motion abnormalities.  The left ventricular internal cavity size was normal in size. There is no  left ventricular hypertrophy. Left ventricular diastolic parameters were  normal.   Right Ventricle: The right ventricular size is normal. Right vetricular  wall thickness was not assessed. Right ventricular systolic function is  mildly reduced. There is moderately elevated pulmonary artery systolic  pressure. The tricuspid regurgitant  velocity is 3.14 m/s, and with an assumed right atrial pressure of 8 mmHg,  the estimated right ventricular systolic pressure is 47.4 mmHg.   Left Atrium: Left atrial size was normal in size.   Right Atrium: Right atrial size was severely dilated.   Pericardium: There is no evidence of pericardial effusion.   Mitral Valve: The mitral valve is normal in structure. Mild mitral valve  regurgitation.   Tricuspid Valve: The tricuspid valve is normal in structure. Tricuspid  valve regurgitation is mild to moderate.   Aortic Valve: The aortic valve is tricuspid. Aortic valve regurgitation is  mild. Aortic regurgitation PHT measures 612 msec. Aortic valve sclerosis  is present, with no evidence of aortic valve stenosis.   Pulmonic Valve: The pulmonic valve was normal in structure. Pulmonic valve  regurgitation is not visualized.   Aorta: The aortic root and ascending aorta are structurally normal, with  no evidence of dilitation.    IAS/Shunts: No atrial level shunt detected by color flow Doppler.   Echocardiogram 02/03/2023  IMPRESSIONS     1. Left ventricular ejection fraction, by estimation, is 60 to 65%. The  left ventricle has normal function. The left ventricle has no regional  wall motion abnormalities. Left ventricular diastolic parameters are  indeterminate. The average left  ventricular global longitudinal strain is -17.9 %. The global longitudinal  strain is normal.   2. Right ventricular systolic function is normal. The right ventricular  size is normal. There is mildly elevated pulmonary artery systolic  pressure. The estimated right ventricular systolic pressure is 40.9 mmHg.   3. The mitral valve is degenerative. Mild mitral valve regurgitation. No  evidence of mitral stenosis. Moderate mitral annular calcification.   4. Tricuspid valve regurgitation is moderate.  5. The aortic valve has an indeterminant number of cusps. Aortic valve  regurgitation is mild to moderate. Aortic valve sclerosis/calcification is  present, without any evidence of aortic stenosis. Aortic regurgitation PHT  measures 341 msec. By PHT the AI   is moderate but visually appears mild.   6. The inferior vena cava is normal in size with greater than 50%  respiratory variability, suggesting right atrial pressure of 3 mmHg.   7. Right atrial size was severely dilated.   8. Left atrial size was mildly dilated.   FINDINGS   Left Ventricle: Left ventricular ejection fraction, by estimation, is 60  to 65%. The left ventricle has normal function. The left ventricle has no  regional wall motion abnormalities. The average left ventricular global  longitudinal strain is -17.9 %.  The global longitudinal strain is normal. The left ventricular internal  cavity size was normal in size. There is no left ventricular hypertrophy.  Left ventricular diastolic parameters are indeterminate. Normal left  ventricular filling pressure.    Right Ventricle: The right ventricular size is normal. No increase in  right ventricular wall thickness. Right ventricular systolic function is  normal. There is mildly elevated pulmonary artery systolic pressure. The  tricuspid regurgitant velocity is 3.08   m/s, and with an assumed right atrial pressure of 3 mmHg, the estimated  right ventricular systolic pressure is 40.9 mmHg.   Left Atrium: Left atrial size was mildly dilated.   Right Atrium: Right atrial size was severely dilated.   Pericardium: There is no evidence of pericardial effusion.   Mitral Valve: The mitral valve is degenerative in appearance. There is  mild thickening of the mitral valve leaflet(s). Moderate mitral annular  calcification. Mild mitral valve regurgitation. No evidence of mitral  valve stenosis.   Tricuspid Valve: The tricuspid valve is normal in structure. Tricuspid  valve regurgitation is moderate . No evidence of tricuspid stenosis.   Aortic Valve: By PHT the AI is moderate but visually appears mild. The  aortic valve has an indeterminant number of cusps. Aortic valve  regurgitation is mild to moderate. Aortic regurgitation PHT measures 341  msec. Aortic valve sclerosis/calcification is  present, without any evidence of aortic stenosis.   Pulmonic Valve: The pulmonic valve was normal in structure. Pulmonic valve  regurgitation is trivial. No evidence of pulmonic stenosis.   Aorta: The aortic root is normal in size and structure.   Venous: The inferior vena cava is normal in size with greater than 50%  respiratory variability, suggesting right atrial pressure of 3 mmHg.   IAS/Shunts: No atrial level shunt detected by color flow Doppler.   Assessment & Plan   1.  ***   Thomasene Ripple. Ibraham Levi NP-C     04/12/2023, 9:34 AM [facility] [address] Office [phone] Fax [phone]    I spent 14 minutes examining this patient, reviewing medications,  and using patient centered shared decision making involving her cardiac care.  Prior to her visit I spent greater than 20 minutes reviewing her past medical history,  medications, and prior cardiac tests.

## 2023-04-13 ENCOUNTER — Ambulatory Visit: Payer: Medicare Other | Attending: General Practice | Admitting: General Practice

## 2023-04-13 ENCOUNTER — Encounter: Payer: Self-pay | Admitting: General Practice

## 2023-04-13 VITALS — BP 140/62 | HR 60 | Ht 62.0 in | Wt 140.0 lb

## 2023-04-13 DIAGNOSIS — I428 Other cardiomyopathies: Secondary | ICD-10-CM | POA: Diagnosis not present

## 2023-04-13 DIAGNOSIS — G4733 Obstructive sleep apnea (adult) (pediatric): Secondary | ICD-10-CM | POA: Insufficient documentation

## 2023-04-13 DIAGNOSIS — I10 Essential (primary) hypertension: Secondary | ICD-10-CM | POA: Diagnosis not present

## 2023-04-13 DIAGNOSIS — I480 Paroxysmal atrial fibrillation: Secondary | ICD-10-CM | POA: Insufficient documentation

## 2023-04-13 MED ORDER — FUROSEMIDE 20 MG PO TABS: ORAL_TABLET | ORAL | 1 refills | Status: DC

## 2023-04-13 NOTE — Patient Instructions (Signed)
Medication Instructions:  INCREASE FUROSEMIDE (LASIX) 40MG  4 DAYS A WEEK AND 20MG  3 DAYS A WEEK *If you need a refill on your cardiac medications before your next appointment, please call your pharmacy*  Lab Work: BMET IN 2 WEEKS If you have labs (blood work) drawn today and your tests are completely normal, you will receive your results only by: MyChart Message (if you have MyChart) OR A paper copy in the mail If you have any lab test that is abnormal or we need to change your treatment, we will call you to review the results.  Testing/Procedures: NONE  Follow-Up: At [facility], you and your health needs are our priority.  As part of our continuing mission to provide you with exceptional heart care, we have created designated Provider Care Teams.  These Care Teams include your primary Cardiologist (physician) and Advanced Practice Providers (APPs -  Physician Assistants and Nurse Practitioners) who all work together to provide you with the care you need, when you need it.  Your next appointment:   3 month(s)  Provider:   Chrystie Nose, MD     Other Instructions [employer]  Outlet Store  Performance Legwear for [facility] Mailing Address:  [address];   [address]  [place], [place] [address]  Tel [phone] Fx [phone]     High Quality Legwear for Today's Active Lifestyles Maximum Compression at the ankle. Compression lessens gradually up the leg.   We manufacture a wide range of compression hosiery for men and women in  different styles, constructions and levels of support.  How Compression Hosiery Works Regulatory affairs officer, [employer]. compression hosiery works by applying graduated pressure to the  muscles and veins in the legs.  When the calf muscle contracts such as during walking  the compression hosiery will "give" and then return to its original position. By doing so  the hosiery is assists your body's circulatory wellness.  The  result is increased leg health and vitality.   Maximum Compression at the ankle Compression lessens gradually up the leg  We Offer: Sheer & Opaque Stockings       COLORS:  Nude, black, white and misc. prints Below Knee Thigh High Pantyhose  High Quality Legwear for Today's Active Lifestyles We manufacture a wide range of compression hosiery for men and women in different styles, construction sand levels of support.  Socks:                     Sheer & Opaque   Compression Levels Include:                                  Stockings Men's               Below Knee                8-15 mmHg   Women's         Thigh High                 15-20 mmHg  Unisex             Pantyhose                 20-30 mmHg  30-40 mmHg  4 Simple Ways to Order   Email  [email or web address] Mail/Email orders are subject to processing and handling charges. Allow 7-10 days for receipt.  Phone [phone]  Please allow 24 hours for return call.   In Person  We recommend calling prior to your visit to confirm store hours as they may change due to holiday, weather, and maintenance.   By Mail When placing an order, please have the following information available. Our representatives are available to assist.     Measurements    THIGH      in.   CALF        in.   ANKLE     in.    Compression  8-15 mmHg 15-20 mmHg**   20-30 mmHg 30-40 mmHg   WOMEN'S MEN'S  Shoe Size Sock Size Shoe Size Sock Size  4 - 5 Small 7.5 and Under Small  5.5 - 7.5 Medium 8 - 10 Medium  8 - 10 Large 10.5 - 12 Large  10.5 and Over X-Large 12.5 and Over X-Large   Knee High Size Chart  Length from CALF MEASUREMENT  floor to bend   in knee. 11" 12" 13" 14" 15" 16" 17" 18" 19" 20" 21" 22"  14" S S S S M M L L L XL XL XL  15" S S S M M L L L XL XL XXL XXL  16" S S M M M L L L XL XXL XXL XXL  17" S M M M M L L XL XL XXL XXL XXL  18" M M M M L L L XL XL XXL XXL XXL   19" M M M M L L XL XL XL XXL XXL XXL   Thigh High Circumference Sizing Chart                 S M L XL XXL  ANKLE 6.5" - 8" 8" - 9.5" 9.5" - 11" 11" - 12.5" 12.5" - 14"  CALF 10.5" - 14.5" 11.5" - 15.5" 12.5" - 17" 13.5" - 17.5" 14.5" - 19.5"  THIGH 15.5" - 22" 17.5" - 24" 19.5" - 26" 22" - 28" 26" - 32"  HIP UP TO 40" UP TO 44" UP TO 48' UP TO 52" UP TO 56"   Pantyhose Size Chart  Height Petite Medium Tall X-Tall Queen Queen +   Weight Weight Weight Weight Weight Weight  4'11" 95-130 135      5'0 95-125 130-145   170-185   5'1" 90-120 125-155 160-165  170-195   5'2" 90-115 120-145 150-165  170-195   5'3" 90-110 115-140 145-165  170-200 200-225  5'4" 100-105 110-135 140-160 165 170-200 200-225  5'5" 100 105-130 135-160 165 170-200 200-225  5'6"  110-125 130-155 160-165 170-200 200-225  5'7"  110-120 125-150 155-165 170-200 195-225  5'8"   120-145 150-165 170-200 190-225  5'9"   125-140 145-170 175-190 185-220  5'10"   125-135 140-185  185-215  5'11"   130-135 140-185  190-210

## 2023-04-20 ENCOUNTER — Other Ambulatory Visit: Payer: Self-pay | Admitting: Adult Health

## 2023-04-28 DIAGNOSIS — I480 Paroxysmal atrial fibrillation: Secondary | ICD-10-CM | POA: Diagnosis not present

## 2023-04-28 DIAGNOSIS — I10 Essential (primary) hypertension: Secondary | ICD-10-CM | POA: Diagnosis not present

## 2023-04-29 ENCOUNTER — Encounter: Payer: Self-pay | Admitting: Cardiology

## 2023-04-29 LAB — BASIC METABOLIC PANEL
BUN/Creatinine Ratio: 32 — ABNORMAL HIGH (ref 12–28)
BUN: 31 mg/dL — ABNORMAL HIGH (ref 8–27)
CO2: 22 mmol/L (ref 20–29)
Calcium: 9.3 mg/dL (ref 8.7–10.3)
Chloride: 100 mmol/L (ref 96–106)
Creatinine, Ser: 0.96 mg/dL (ref 0.57–1.00)
Glucose: 89 mg/dL (ref 70–99)
Potassium: 4.9 mmol/L (ref 3.5–5.2)
Sodium: 140 mmol/L (ref 134–144)
eGFR: 57 mL/min/{1.73_m2} — ABNORMAL LOW (ref >59)

## 2023-05-03 DIAGNOSIS — S81801A Unspecified open wound, right lower leg, initial encounter: Secondary | ICD-10-CM | POA: Diagnosis not present

## 2023-05-06 ENCOUNTER — Other Ambulatory Visit: Payer: Self-pay | Admitting: Neurology

## 2023-05-06 DIAGNOSIS — L03115 Cellulitis of right lower limb: Secondary | ICD-10-CM | POA: Diagnosis not present

## 2023-05-13 DIAGNOSIS — D225 Melanocytic nevi of trunk: Secondary | ICD-10-CM | POA: Diagnosis not present

## 2023-05-13 DIAGNOSIS — D2262 Melanocytic nevi of left upper limb, including shoulder: Secondary | ICD-10-CM | POA: Diagnosis not present

## 2023-05-13 DIAGNOSIS — S80811A Abrasion, right lower leg, initial encounter: Secondary | ICD-10-CM | POA: Diagnosis not present

## 2023-05-13 DIAGNOSIS — L720 Epidermal cyst: Secondary | ICD-10-CM | POA: Diagnosis not present

## 2023-05-13 DIAGNOSIS — D2261 Melanocytic nevi of right upper limb, including shoulder: Secondary | ICD-10-CM | POA: Diagnosis not present

## 2023-05-13 DIAGNOSIS — L821 Other seborrheic keratosis: Secondary | ICD-10-CM | POA: Diagnosis not present

## 2023-05-13 DIAGNOSIS — L570 Actinic keratosis: Secondary | ICD-10-CM | POA: Diagnosis not present

## 2023-05-13 DIAGNOSIS — D1801 Hemangioma of skin and subcutaneous tissue: Secondary | ICD-10-CM | POA: Diagnosis not present

## 2023-05-13 DIAGNOSIS — Z85828 Personal history of other malignant neoplasm of skin: Secondary | ICD-10-CM | POA: Diagnosis not present

## 2023-05-14 ENCOUNTER — Encounter: Payer: Self-pay | Admitting: Internal Medicine

## 2023-05-14 ENCOUNTER — Ambulatory Visit (INDEPENDENT_AMBULATORY_CARE_PROVIDER_SITE_OTHER): Payer: Medicare Other | Admitting: Internal Medicine

## 2023-05-14 VITALS — BP 110/64 | HR 65 | Temp 98.1°F | Ht 62.0 in | Wt 135.2 lb

## 2023-05-14 DIAGNOSIS — E782 Mixed hyperlipidemia: Secondary | ICD-10-CM | POA: Diagnosis not present

## 2023-05-14 DIAGNOSIS — I480 Paroxysmal atrial fibrillation: Secondary | ICD-10-CM | POA: Diagnosis not present

## 2023-05-14 DIAGNOSIS — I739 Peripheral vascular disease, unspecified: Secondary | ICD-10-CM | POA: Diagnosis not present

## 2023-05-14 DIAGNOSIS — Z7901 Long term (current) use of anticoagulants: Secondary | ICD-10-CM | POA: Diagnosis not present

## 2023-05-14 DIAGNOSIS — J449 Chronic obstructive pulmonary disease, unspecified: Secondary | ICD-10-CM | POA: Diagnosis not present

## 2023-05-14 DIAGNOSIS — I10 Essential (primary) hypertension: Secondary | ICD-10-CM | POA: Diagnosis not present

## 2023-05-14 MED ORDER — FUROSEMIDE 20 MG PO TABS: ORAL_TABLET | ORAL | 1 refills | Status: DC

## 2023-05-14 NOTE — Assessment & Plan Note (Signed)
Status post bilateral iliac stents in 2021, followed by cardiology.

## 2023-05-14 NOTE — Assessment & Plan Note (Signed)
On Eliquis

## 2023-05-14 NOTE — Assessment & Plan Note (Signed)
Stable , followed by pulmonary °

## 2023-05-14 NOTE — Assessment & Plan Note (Signed)
Stable, anticoagulated, on Tikosyn.

## 2023-05-14 NOTE — Progress Notes (Signed)
New Patient Office Visit     CC/Reason for Visit: Establish care, discuss chronic medical conditions Previous PCP: [facility] Last Visit: Unknown  HPI: Holly Benson is a 89 y.o. female who is coming in today for the above mentioned reasons. Past Medical History is significant for: Atrial fibrillation anticoagulated on Eliquis, hypertension, peripheral vascular disease status post bilateral iliac stents in 2021, hyperlipidemia, COPD.  She follows with multiple specialists including cardiology, pulmonary.  She had a ventral hernia repair and cataract surgery.  She was a smoker who quit in 1993, she drinks about a glass of wine a day, no known allergies.  She lives with her daughter.  She is requesting Lasix refills.   Past Medical/Surgical History: Past Medical History:  Diagnosis Date   Arrhythmia    Arthritis    Atrial fibrillation (HCC)    Constipation    chronic   COPD (chronic obstructive pulmonary disease) (HCC)    Hyperlipidemia    Hypertension    pulmonary   OSA on CPAP    Osteoarthritis    Osteoporosis    Peripheral neuropathy    Pulmonary hypertension (HCC)    RLS (restless legs syndrome)    Sleep apnea     Past Surgical History:  Procedure Laterality Date   ABDOMINAL AORTOGRAM W/LOWER EXTREMITY N/A 03/21/2020   Procedure: ABDOMINAL AORTOGRAM W/ Bilateral LOWER EXTREMITY Run[place];  Surgeon: Iran Ouch, MD;  Location: [facility];  Service: Cardiovascular;  Laterality: N/A;   ABDOMINAL HYSTERECTOMY Bilateral    arm surgery Right    Broken arm and has a plate in it   CARDIOVERSION N/A 03/12/2023   Procedure: CARDIOVERSION;  Surgeon: Quintella Reichert, MD;  Location: [facility];  Service: Cardiovascular;  Laterality: N/A;   CATARACT EXTRACTION Bilateral    COLONOSCOPY     More than 10 years ago In [facility]   KNEE ARTHROSCOPY Right    PERIPHERAL VASCULAR INTERVENTION Bilateral 03/21/2020   Procedure: PERIPHERAL VASCULAR  INTERVENTION;  Surgeon: Iran Ouch, MD;  Location: [facility];  Service: Cardiovascular;  Laterality: Bilateral;  external iliac   TONSILLECTOMY      Social History:  reports that she quit smoking about 31 years ago. Her smoking use included cigarettes. She started smoking about 68 years ago. She has a 55.5 pack-year smoking history. She has never used smokeless tobacco. She reports current alcohol use of about 7.0 standard drinks of alcohol per week. She reports that she does not use drugs.  Allergies: Allergies  Allergen Reactions   Lovenox [Enoxaparin Sodium] Hives and Rash    PT STATES SHE BROKE OUT IN A RASH HEAD TO TOE AND LASTED ABOUT 3 WEEKS    Metoprolol Succinate [Metoprolol] Other (See Comments)    Muscle aches, hand pain, and tingling    Family History:  Family History  Problem Relation Age of Onset   Suicidality Father    Stroke Maternal Grandfather    Hypertension Sister    Colon cancer Neg Hx    Esophageal cancer Neg Hx    Pancreatic cancer Neg Hx    Stomach cancer Neg Hx    Liver disease Neg Hx      Current Outpatient Medications:    acetaminophen (TYLENOL) 325 MG tablet, Take 650 mg by mouth as needed for moderate pain or headache., Disp: , Rfl:    albuterol (VENTOLIN HFA) 108 (90 Base) MCG/ACT inhaler, INHALE TWO PUFFS BY MOUTH EVERY 6 HOURS  AS NEEDED FOR WHEEZING OR FOR SHORTNESS OF BREATH, Disp: 18 g, Rfl: 6   amLODipine (NORVASC) 2.5 MG tablet, Take 1 tablet (2.5 mg total) by mouth daily., Disp: 90 tablet, Rfl: 3   amLODipine (NORVASC) 5 MG tablet, Take 1 tablet (5 mg total) by mouth daily. (Patient taking differently: Take 5 mg by mouth See admin instructions. Take with 2.5 mg in the morning for a total of 7.5 mg daily), Disp: 30 tablet, Rfl: 7   apixaban (ELIQUIS) 5 MG TABS tablet, Take 1 tablet (5 mg total) by mouth 2 (two) times daily., Disp: 180 tablet, Rfl: 1   bisoprolol (ZEBETA) 10 MG tablet, Take 1 tablet (10 mg total) by mouth at  bedtime., Disp: 90 tablet, Rfl: 1   Cholecalciferol (VITAMIN D3 MAXIMUM STRENGTH) 125 MCG (5000 UT) capsule, Take 5,000 Units by mouth daily., Disp: , Rfl:    clonazePAM (KLONOPIN) 0.5 MG tablet, Take 0.5 tablets (0.25 mg total) by mouth at bedtime as needed for anxiety. (Patient taking differently: Take 0.25 mg by mouth at bedtime as needed (Restless leg).), Disp: 30 tablet, Rfl: 2   docusate sodium (COLACE) 100 MG capsule, Take 300 mg by mouth at bedtime., Disp: , Rfl:    dofetilide (TIKOSYN) 250 MCG capsule, Take 1 capsule (250 mcg total) by mouth 2 (two) times daily., Disp: 180 capsule, Rfl: 3   irbesartan (AVAPRO) 300 MG tablet, Take 1 tablet (300 mg total) by mouth daily., Disp: 90 tablet, Rfl: 1   Misc Natural Products (COLON CLEANSE) CAPS, Take 4 capsules by mouth as needed (constipation)., Disp: , Rfl:    Multiple Vitamins-Minerals (PRESERVISION AREDS 2) CAPS, Take 1 tablet by mouth 2 (two) times daily., Disp: , Rfl:    polyethylene glycol (MIRALAX / GLYCOLAX) 17 g packet, Take 17 g by mouth at bedtime., Disp: , Rfl:    PRESCRIPTION MEDICATION, at bedtime as needed (Sometimes). CPAP, Disp: , Rfl:    rOPINIRole (REQUIP) 0.5 MG tablet, One in AM and one after 3 Pm and one after dinner 8 Pm (Patient taking differently: Take 0.5 mg by mouth in the morning, at noon, in the evening, and at bedtime.), Disp: 90 tablet, Rfl: 5   Tiotropium Bromide-Olodaterol (STIOLTO RESPIMAT) 2.5-2.5 MCG/ACT AERS, USE 2 INHALATIONS ORALLY   EVERY MORNING, Disp: 12 g, Rfl: 3   furosemide (LASIX) 20 MG tablet, Take 2 tablets (40 mg total) by mouth 4 (four) times a week AND 1 tablet (20 mg total) 3 (three) times a week. Take one tablet by mouth on Monday, Wednesday and Friday., Disp: 36 tablet, Rfl: 1 No current facility-administered medications for this visit.  Facility-Administered Medications Ordered in Other Visits:    technetium tetrofosmin (TC-MYOVIEW) injection 31.9 millicurie, 31.9 millicurie, Intravenous, Once  PRN, Chilton Si, MD  Review of Systems:  Negative except as indicated in HPI.   Physical Exam: Vitals:   05/14/23 1453  BP: 110/64  Pulse: 65  Temp: 98.1 F (36.7 C)  TempSrc: Oral  SpO2: 93%  Weight: 135 lb 3.2 oz (61.3 kg)  Height: 5\' 2"  (1.575 m)   Body mass index is 24.73 kg/m.  Physical Exam Vitals reviewed.  Constitutional:      Appearance: Normal appearance.  HENT:     Head: Normocephalic and atraumatic.  Eyes:     Conjunctiva/sclera: Conjunctivae normal.     Pupils: Pupils are equal, round, and reactive to light.  Cardiovascular:     Rate and Rhythm: Normal rate. Rhythm irregular.  Pulmonary:  Effort: Pulmonary effort is normal.     Breath sounds: Normal breath sounds.  Skin:    General: Skin is warm and dry.  Neurological:     General: No focal deficit present.     Mental Status: She is alert and oriented to person, place, and time.  Psychiatric:        Mood and Affect: Mood normal.        Behavior: Behavior normal.        Thought Content: Thought content normal.        Judgment: Judgment normal.       Impression and Plan:  PAF (paroxysmal atrial fibrillation) (HCC) Assessment & Plan: Stable, anticoagulated, on Tikosyn.  Orders: -     Furosemide; Take 2 tablets (40 mg total) by mouth 4 (four) times a week AND 1 tablet (20 mg total) 3 (three) times a week. Take one tablet by mouth on Monday, Wednesday and Friday.  Dispense: 36 tablet; Refill: 1  Essential hypertension Assessment & Plan: Well-controlled on current.   Mixed hyperlipidemia Assessment & Plan: Not on statin per choice.   Chronic obstructive pulmonary disease, unspecified COPD type (HCC) Assessment & Plan: Stable, followed by pulmonary.   PVD (peripheral vascular disease) (HCC) Assessment & Plan: Status post bilateral iliac stents in 2021, followed by cardiology.   Chronic anticoagulation Assessment & Plan: On Eliquis.     Time spent: 46 minutes  reviewing chart, interviewing and examining patient and formulating plan of care.    Chaya Jan, MD [facility]

## 2023-05-14 NOTE — Assessment & Plan Note (Signed)
Not on statin per choice.

## 2023-05-14 NOTE — Assessment & Plan Note (Signed)
Well controlled on current

## 2023-05-18 ENCOUNTER — Ambulatory Visit: Payer: Medicare Other | Admitting: Emergency Medicine

## 2023-05-30 ENCOUNTER — Other Ambulatory Visit: Payer: Self-pay | Admitting: Emergency Medicine

## 2023-06-23 ENCOUNTER — Ambulatory Visit: Payer: Medicare Other | Admitting: Internal Medicine

## 2023-07-09 ENCOUNTER — Other Ambulatory Visit (HOSPITAL_COMMUNITY): Payer: Self-pay | Admitting: Physician Assistant

## 2023-07-14 ENCOUNTER — Ambulatory Visit
Admission: RE | Admit: 2023-07-14 | Discharge: 2023-07-14 | Disposition: A | Discharge status: Home / Self Care | Payer: Medicare Other | Source: Ambulatory Visit | Attending: Emergency Medicine | Admitting: Emergency Medicine

## 2023-07-14 DIAGNOSIS — R9389 Abnormal findings on diagnostic imaging of other specified body structures: Secondary | ICD-10-CM

## 2023-07-14 DIAGNOSIS — R911 Solitary pulmonary nodule: Secondary | ICD-10-CM | POA: Diagnosis not present

## 2023-07-24 ENCOUNTER — Other Ambulatory Visit: Payer: Self-pay | Admitting: *Deleted

## 2023-07-24 ENCOUNTER — Other Ambulatory Visit: Payer: Self-pay | Admitting: Cardiology

## 2023-07-24 ENCOUNTER — Encounter: Payer: Self-pay | Admitting: Cardiology

## 2023-07-24 MED ORDER — APIXABAN 5 MG PO TABS: 5.0000 mg | ORAL_TABLET | Freq: Two times a day (BID) | ORAL | 0 refills | Status: DC

## 2023-07-24 NOTE — Telephone Encounter (Signed)
Prescription refill request for Eliquis received. Indication: afib  Last office visit: [place], 04/13/2023 Scr: 0.85, 03/24/2023 Age: 89 yo  Weight: 61.3 kg   [facility] to [facility] for 10 day supply per pt request.

## 2023-07-24 NOTE — Telephone Encounter (Signed)
Prescription refill request for Eliquis received. Indication:afib Last office visit:6/24 Scr:0.96  6/24 Age: 89 Weight:61.3  kg  Prescription refilled

## 2023-07-30 ENCOUNTER — Encounter: Payer: Self-pay | Admitting: Emergency Medicine

## 2023-07-30 ENCOUNTER — Ambulatory Visit: Payer: Medicare Other | Admitting: Emergency Medicine

## 2023-07-30 VITALS — BP 122/58 | HR 75 | Ht 62.0 in | Wt 140.0 lb

## 2023-07-30 DIAGNOSIS — G4731 Primary central sleep apnea: Secondary | ICD-10-CM | POA: Diagnosis not present

## 2023-07-30 DIAGNOSIS — J449 Chronic obstructive pulmonary disease, unspecified: Secondary | ICD-10-CM

## 2023-07-30 DIAGNOSIS — G4739 Other sleep apnea: Secondary | ICD-10-CM

## 2023-07-30 DIAGNOSIS — R9389 Abnormal findings on diagnostic imaging of other specified body structures: Secondary | ICD-10-CM | POA: Diagnosis not present

## 2023-07-30 NOTE — Assessment & Plan Note (Signed)
Slowly enlarging pulmonary nodule, consistent with a non-small cell lung cancer, likely stage I.  Her PET scan in March 2023 was negative, we will plan to repeat now.  She is high risk for bronchoscopy or other diagnostics.  I think she may be a candidate for empiric SBRT if the PET scan is reassuring.  She is willing to entertain this.  I will talk to radiation oncology to see if they would be willing to see her if the PET shows stage I disease

## 2023-07-30 NOTE — Patient Instructions (Signed)
We reviewed your CT scans and your PET scan today.  There has been slow enlargement of a posterior right upper lobe pulmonary nodule suspicious for a slow-growing lung cancer. Dr. Delton Coombes will discuss your case with radiation oncology to see if you might be a candidate for empiric treatment to that nodule.  Depending on those discussions we will refer you to see radiation oncology or we can revisit possible lung nodule biopsy. Please continue your Stiolto as you have been taking it Keep albuterol available to use 2 puffs when needed for shortness of breath, chest tightness, wheezing. We will form a walking oximetry today to ensure that you do not need to have oxygen at home Follow with Dr. Delton Coombes or APP in 2 month or sooner if you have any problems.

## 2023-07-30 NOTE — Progress Notes (Signed)
Subjective:    Patient ID: Holly Benson, female    DOB: 03/22/1933, 89 y.o.   MRN: [id number]  COPD She complains of cough and shortness of breath. There is no wheezing. Pertinent negatives include no ear pain, fever, headaches, postnasal drip, rhinorrhea, sneezing, sore throat or trouble swallowing. Her past medical history is significant for COPD.   ROV 01/20/2023 --Holly Benson is 9 with a history of severe COPD on Stiolto.  Also with atrial fibrillation on dofetilide, GERD, OSA/OHS for which she uses a Servo vent.  She has chronic cough with chronic rhinitis and GERD.  We have also been following a 1.3 cm right upper lobe pulmonary nodule, hypermetabolic on PET scan and most recently imaged 07/12/2022.  Most consistent with a slow-growing non-small cell lung cancer.  We have been conservative, have been following with serial imaging.  She had a repeat CT chest 01/08/2023 as below.  She notes that she has had a significant change in her breathing and exertional tolerance over the last 6 months.  She remains on Stiolto. She has tried albuterol, may help her some but not completely. No desaturations noted.   CT scan of the chest 01/08/2023 reviewed by me, shows that again the right upper lobe posterior pulmonary nodule measures 1.2 x 1.0 cm, overall stable.  No other nodules noted.  ROV 07/30/2023 --follow-up visit for 89 year old woman with history of severe COPD on Stiolto.  She has a slowly enlarging pulmonary nodule that we have followed with serial imaging and which is PET positive.  PMH also significant for A-fib, GERD, OSA/OHS for which she uses her servo ventilator, chronic rhinitis and GERD with chronic cough.  We have been following her pulmonary nodule conservatively.  Most recent CT chest as below On stiolto, albuterol once a day, does help her.   CT scan of the chest 07/14/2023 reviewed by me, shows continued slow increase in size in her posterior right upper lobe pulmonary nodule, now  13 x 11 mm.  No mediastinal or hilar adenopathy.   Review of Systems  Constitutional:  Negative for fever and unexpected weight change.  HENT:  Negative for congestion, dental problem, ear pain, nosebleeds, postnasal drip, rhinorrhea, sinus pressure, sneezing, sore throat and trouble swallowing.   Eyes:  Negative for redness and itching.  Respiratory:  Positive for cough and shortness of breath. Negative for chest tightness and wheezing.   Cardiovascular:  Negative for palpitations and leg swelling.  Gastrointestinal:  Negative for nausea and vomiting.  Genitourinary:  Negative for dysuria.  Musculoskeletal:  Negative for joint swelling.  Skin:  Negative for rash.  Neurological:  Negative for headaches.  Hematological:  Does not bruise/bleed easily.  Psychiatric/Behavioral:  Negative for dysphoric mood. The patient is not nervous/anxious.        Objective:   Physical Exam Vitals:   07/30/23 1312  BP: (!) 122/58  Pulse: 75  SpO2: 93%  Weight: 140 lb (63.5 kg)  Height: 5\' 2"  (1.575 m)   Gen: Pleasant, thin woman, in no distress,  normal affect, mild kyphosis  ENT: No lesions,  mouth clear,  oropharynx clear, no postnasal drip  Neck: No JVD, no stridor  Lungs: No use of accessory muscles, few bibasilar inspiratory crackles  Cardiovascular: RRR, heart sounds normal, no murmur or gallops, no peripheral edema  Musculoskeletal: joints of hands swollen, nodular  Neuro: alert, non focal  Skin: Warm, no lesions or rashes      Assessment & Plan:  Abnormal CT of  the chest Slowly enlarging pulmonary nodule, consistent with a non-small cell lung cancer, likely stage I.  Her PET scan in March 2023 was negative, we will plan to repeat now.  She is high risk for bronchoscopy or other diagnostics.  I think she may be a candidate for empiric SBRT if the PET scan is reassuring.  She is willing to entertain this.  I will talk to radiation oncology to see if they would be willing to see  her if the PET shows stage I disease  COPD (chronic obstructive pulmonary disease) (HCC) Continue same regimen.  She has exertional dyspnea.  We will check a walking oximetry today to ensure no evidence of desaturation  Complex sleep apnea syndrome Continue servo ventilator as ordered     Levy Pupa, MD, PhD 07/30/2023, 2:00 PM [facility] [phone] or if no answer [phone]

## 2023-07-30 NOTE — Assessment & Plan Note (Signed)
Continue servo ventilator as ordered

## 2023-07-30 NOTE — Assessment & Plan Note (Signed)
Continue same regimen.  She has exertional dyspnea.  We will check a walking oximetry today to ensure no evidence of desaturation

## 2023-08-13 ENCOUNTER — Ambulatory Visit (HOSPITAL_COMMUNITY)
Admission: RE | Admit: 2023-08-13 | Discharge: 2023-08-13 | Disposition: A | Discharge status: Home / Self Care | Payer: Medicare Other | Source: Ambulatory Visit | Attending: Emergency Medicine | Admitting: Emergency Medicine

## 2023-08-13 DIAGNOSIS — R911 Solitary pulmonary nodule: Secondary | ICD-10-CM | POA: Diagnosis not present

## 2023-08-13 DIAGNOSIS — R9389 Abnormal findings on diagnostic imaging of other specified body structures: Secondary | ICD-10-CM | POA: Diagnosis not present

## 2023-08-13 LAB — GLUCOSE, CAPILLARY: Glucose-Capillary: 94 mg/dL (ref 70–99)

## 2023-08-13 MED ORDER — FLUDEOXYGLUCOSE F - 18 (FDG) INJECTION: 7.9000 | Freq: Once | INTRAVENOUS | Status: DC | PRN

## 2023-08-13 MED ORDER — FLUDEOXYGLUCOSE F - 18 (FDG) INJECTION
7.0000 | Freq: Once | INTRAVENOUS | Status: AC | PRN
  Administered 2023-08-13: 7.6 via INTRAVENOUS

## 2023-08-14 ENCOUNTER — Other Ambulatory Visit: Payer: Self-pay | Admitting: Student

## 2023-08-20 DIAGNOSIS — H1849 Other corneal degeneration: Secondary | ICD-10-CM | POA: Diagnosis not present

## 2023-08-20 DIAGNOSIS — H5202 Hypermetropia, left eye: Secondary | ICD-10-CM | POA: Diagnosis not present

## 2023-08-20 DIAGNOSIS — H353131 Nonexudative age-related macular degeneration, bilateral, early dry stage: Secondary | ICD-10-CM | POA: Diagnosis not present

## 2023-08-20 DIAGNOSIS — Z961 Presence of intraocular lens: Secondary | ICD-10-CM | POA: Diagnosis not present

## 2023-08-20 DIAGNOSIS — H5211 Myopia, right eye: Secondary | ICD-10-CM | POA: Diagnosis not present

## 2023-08-20 DIAGNOSIS — H524 Presbyopia: Secondary | ICD-10-CM | POA: Diagnosis not present

## 2023-08-20 DIAGNOSIS — H52223 Regular astigmatism, bilateral: Secondary | ICD-10-CM | POA: Diagnosis not present

## 2023-08-20 DIAGNOSIS — H04123 Dry eye syndrome of bilateral lacrimal glands: Secondary | ICD-10-CM | POA: Diagnosis not present

## 2023-08-31 ENCOUNTER — Encounter: Payer: Self-pay | Admitting: Nurse Practitioner

## 2023-08-31 ENCOUNTER — Ambulatory Visit (INDEPENDENT_AMBULATORY_CARE_PROVIDER_SITE_OTHER): Payer: Medicare Other | Admitting: Nurse Practitioner

## 2023-08-31 VITALS — BP 112/80 | HR 67 | Ht 62.0 in | Wt 144.0 lb

## 2023-08-31 DIAGNOSIS — R911 Solitary pulmonary nodule: Secondary | ICD-10-CM | POA: Insufficient documentation

## 2023-08-31 DIAGNOSIS — J449 Chronic obstructive pulmonary disease, unspecified: Secondary | ICD-10-CM

## 2023-08-31 NOTE — Patient Instructions (Signed)
Continue Albuterol inhaler 2 puffs every 6 hours as needed for shortness of breath or wheezing. Notify if symptoms persist despite rescue inhaler/neb use.  Stop Stiolto. Trial Trelegy 1 puff daily. Brush tongue and rinse mouth afterwards. If this helps your breathing and/or your cough, let me know and I will update your prescription. If not, you can go back to your Stiolto  I will follow up with Dr. Delton Coombes to see if he's talked to Radiation Oncology. If not, I will reach out to them about empiric radiation of your nodule. You should hear from one of [place] this week.  Follow up in 4-5 weeks with Dr. Delton Coombes or Florentina Addison Daron Breeding,NP to see how new inhaler is going. If symptoms do not improve or worsen, please contact office for sooner follow up or seek emergency care.

## 2023-08-31 NOTE — Progress Notes (Unsigned)
@Patient  ID: Holly Benson, female    DOB: 1933-04-08, 89 y.o.   MRN: [id number]  Chief Complaint  Patient presents with   Follow-up    Pt is here for F/U visit.     Referring provider: Philip Aspen, [place]*  HPI: 89 year old female, former smoker followed for COPD, lung nodule. She is a patient of Dr. Kavin Leech and last seen in office 07/30/2023. Past medical history significant for cardiomyopathy, HTN, PAF on Eliquis and amiodarone, complex sleep apnea, IDA, RLS, HLD.   TEST/EVENTS:  03/15/2021 PFT: FVC 104, FEV1 87, ratio 64, TLC 91, DLCO 54.  Mild obstructive airway disease without reversibility and moderately severe diffusion defect 01/08/2023 CT chest: Right upper lobe nodule 1.2 x 1 cm, overall stable 07/14/2023 CT chest: Right upper lobe nodule, 13 x 11 mm; slow increase in size when compared to prior 08/13/2023 PET scan: 13 mm nodule in right lung apex, mildly progressive, suspicious for primary bronchogenic carcinoma  07/30/2023: OV with Dr. Delton Coombes.  COPD on Stiolto.  Using albuterol once a day.  Slowly enlarging pulmonary nodule that has been followed with serial imaging and is PET positive. May be a candidate for empiric SBRT if PET scan is reassuring. She is willing to entertain this. Will discuss with radiation oncology.   08/31/2023: Today - follow up Patient presents today for follow-up with her daughter-in-law to discuss recent PET scan results.  The right lung nodule has mildly progressed and continues to be hypermetabolic.  No evidence of disease spread, consistent with stage I primary bronchogenic carcinoma.  Previous discussion was to consider treat with empiric SBRT.  She would like to move forward with this.  Regarding her breathing, feels like she is at her baseline.  She gets short winded with moderate intensity activities.  She does have a daily cough with clear to white phlegm.  Tends to produce sputum in the morning and then clears up throughout the day.  Has an  occasional wheeze.  She does have issues with allergies and has baseline postnasal drainage.  She denies any fevers, chills, night sweats, hemoptysis, anorexia, weight loss.  She uses Stiolto daily.  Takes albuterol twice a day usually.  Has never tried any other maintenance inhalers.   Allergies  Allergen Reactions   Lovenox [Enoxaparin Sodium] Hives and Rash    PT STATES SHE BROKE OUT IN A RASH HEAD TO TOE AND LASTED ABOUT 3 WEEKS    Metoprolol Succinate [Metoprolol] Other (See Comments)    Muscle aches, hand pain, and tingling    Immunization History  Administered Date(s) Administered   Influenza, High Dose Seasonal PF 08/03/2017, 07/15/2018, 06/28/2019, 08/03/2020   Influenza,inj,Quad PF,6+ Mos 08/02/2018   Influenza-Unspecified 08/27/2021, 08/01/2022   PFIZER(Purple Top)SARS-COV-2 Vaccination 12/08/2019, 01/02/2020, 10/02/2020, 03/04/2021, 09/12/2021   PNEUMOCOCCAL CONJUGATE-20 05/28/2023   Pneumococcal Polysaccharide-23 01/27/2020   Td 02/22/2009   Tdap 05/28/2023   Yellow Fever 02/22/2009   Zoster Recombinant(Shingrix) 12/25/2017, 03/04/2021    Past Medical History:  Diagnosis Date   Arrhythmia    Arthritis    Atrial fibrillation (HCC)    Constipation    chronic   COPD (chronic obstructive pulmonary disease) (HCC)    Hyperlipidemia    Hypertension    pulmonary   OSA on CPAP    Osteoarthritis    Osteoporosis    Peripheral neuropathy    Pulmonary hypertension (HCC)    RLS (restless legs syndrome)    Sleep apnea     Tobacco History: Social History  Tobacco Use  Smoking Status Former   Current packs/day: 0.00   Average packs/day: 1.5 packs/day for 37.0 years (55.5 ttl pk-yrs)   Types: Cigarettes   Start date: 16   Quit date: 11/04/1991   Years since quitting: 31.8  Smokeless Tobacco Never  Tobacco Comments   Former smoker 03/14/22   Counseling given: Not Answered Tobacco comments: Former smoker 03/14/22   Outpatient Medications Prior to Visit   Medication Sig Dispense Refill   acetaminophen (TYLENOL) 325 MG tablet Take 650 mg by mouth as needed for moderate pain or headache.     albuterol (VENTOLIN HFA) 108 (90 Base) MCG/ACT inhaler INHALE TWO PUFFS BY MOUTH EVERY 6 HOURS AS NEEDED FOR WHEEZING OR FOR SHORTNESS OF BREATH 18 g 6   amLODipine (NORVASC) 2.5 MG tablet Take 1 tablet (2.5 mg total) by mouth daily. 90 tablet 3   amLODipine (NORVASC) 5 MG tablet Take 1 tablet (5 mg total) by mouth daily. (Patient taking differently: Take 5 mg by mouth See admin instructions. Take with 2.5 mg in the morning for a total of 7.5 mg daily) 30 tablet 7   apixaban (ELIQUIS) 5 MG TABS tablet Take 1 tablet (5 mg total) by mouth 2 (two) times daily. 20 tablet 0   bisoprolol (ZEBETA) 10 MG tablet TAKE 1 TABLET BY MOUTH AT BEDTIME 30 tablet 0   Cholecalciferol (VITAMIN D3 MAXIMUM STRENGTH) 125 MCG (5000 UT) capsule Take 5,000 Units by mouth daily.     clonazePAM (KLONOPIN) 0.5 MG tablet Take 0.5 tablets (0.25 mg total) by mouth at bedtime as needed for anxiety. (Patient taking differently: Take 0.25 mg by mouth at bedtime as needed (Restless leg).) 30 tablet 2   docusate sodium (COLACE) 100 MG capsule Take 300 mg by mouth at bedtime.     dofetilide (TIKOSYN) 250 MCG capsule TAKE 1 CAPSULE BY MOUTH 2 TIMES A DAY. 180 capsule 2   furosemide (LASIX) 20 MG tablet Take 2 tablets (40 mg total) by mouth 4 (four) times a week AND 1 tablet (20 mg total) 3 (three) times a week. Take one tablet by mouth on Monday, Wednesday and Friday. 36 tablet 1   irbesartan (AVAPRO) 300 MG tablet Take 1 tablet (300 mg total) by mouth daily. 90 tablet 1   Misc Natural Products (COLON CLEANSE) CAPS Take 4 capsules by mouth as needed (constipation).     Multiple Vitamins-Minerals (PRESERVISION AREDS 2) CAPS Take 1 tablet by mouth 2 (two) times daily.     polyethylene glycol (MIRALAX / GLYCOLAX) 17 g packet Take 17 g by mouth at bedtime.     PRESCRIPTION MEDICATION at bedtime as needed  (Sometimes). CPAP     rOPINIRole (REQUIP) 0.5 MG tablet One in AM and one after 3 Pm and one after dinner 8 Pm (Patient taking differently: Take 0.5 mg by mouth in the morning, at noon, in the evening, and at bedtime.) 90 tablet 5   STIOLTO RESPIMAT 2.5-2.5 MCG/ACT AERS USE 2 INHALATIONS ORALLY   EVERY MORNING 12 g 3   Facility-Administered Medications Prior to Visit  Medication Dose Route Frequency Provider Last Rate Last Admin   technetium tetrofosmin (TC-MYOVIEW) injection 31.9 millicurie  31.9 millicurie Intravenous Once PRN Chilton Si, MD         Review of Systems:   Constitutional: No weight loss or gain, night sweats, fevers, chills, or lassitude. +baseline fatigue  HEENT: No headaches, difficulty swallowing, tooth/dental problems, or sore throat. No sneezing, itching, ear ache. +nasal congestion, post  nasal drip CV:  No chest pain, orthopnea, PND, swelling in lower extremities, anasarca, dizziness, palpitations, syncope Resp: +shortness of breath with exertion; productive cough; rare wheeze. No excess mucus or change in color of mucus. No hemoptysis. No chest wall deformity GI:  No heartburn, indigestion, abdominal pain, nausea, vomiting, diarrhea, change in bowel habits, loss of appetite GU: No dysuria, change in color of urine, urgency or frequency.   Skin: No rash, lesions, ulcerations MSK:  No joint pain or swelling.   Neuro: No dizziness or lightheadedness.  Psych: No depression or anxiety. Mood stable.     Physical Exam:  BP 112/80 (BP Location: Left Arm, Cuff Size: Normal)   Pulse 67   Ht 5\' 2"  (1.575 m)   Wt 144 lb (65.3 kg)   SpO2 96%   BMI 26.34 kg/m   GEN: Pleasant, interactive, well-appearing; elderly; in no acute distress HEENT:  Normocephalic and atraumatic. PERRLA. Sclera white. Nasal turbinates pink, moist and patent bilaterally. No rhinorrhea present. Oropharynx pink and moist, without exudate or edema. No lesions, ulcerations, or postnasal drip.   NECK:  Supple w/ fair ROM. No JVD present. Thyroid symmetrical with no goiter or nodules palpated. No lymphadenopathy.   CV: RRR, no m/r/g, no peripheral edema. Pulses intact, +2 bilaterally. No cyanosis, pallor or clubbing. PULMONARY:  Unlabored, regular breathing. Clear bilaterally A&P w/o wheezes/rales/rhonchi. No accessory muscle use.  GI: BS present and normoactive. Soft, non-tender to palpation. No organomegaly or masses detected.  MSK: No erythema, warmth or tenderness. Cap refil <2 sec all extrem. No deformities or joint swelling noted.  Neuro: A/Ox3. No focal deficits noted.   Skin: Warm, no lesions or rashe Psych: Normal affect and behavior. Judgement and thought content appropriate.     Lab Results:  CBC    Component Value Date/Time   WBC 6.8 01/01/2023 1614   WBC 7.5 05/26/2022 1046   RBC 4.03 01/01/2023 1614   RBC 4.60 05/26/2022 1046   HGB 13.9 03/12/2023 0938   HGB 13.4 01/01/2023 1614   HCT 41.0 03/12/2023 0938   HCT 39.0 01/01/2023 1614   PLT 306 01/01/2023 1614   MCV 97 01/01/2023 1614   MCH 33.3 (H) 01/01/2023 1614   MCH 33.9 (H) 05/26/2022 1046   MCHC 34.4 01/01/2023 1614   MCHC 34.7 05/26/2022 1046   RDW 12.4 01/01/2023 1614   LYMPHSABS 1,193 05/26/2022 1046   LYMPHSABS 0.9 07/31/2020 1427   MONOABS 1.0 04/26/2022 1743   EOSABS 30 05/26/2022 1046   EOSABS 0.0 07/31/2020 1427   BASOSABS 30 05/26/2022 1046   BASOSABS 0.0 07/31/2020 1427    BMET    Component Value Date/Time   NA 140 04/28/2023 1249   K 4.9 04/28/2023 1249   CL 100 04/28/2023 1249   CO2 22 04/28/2023 1249   GLUCOSE 89 04/28/2023 1249   GLUCOSE 98 03/24/2023 1440   BUN 31 (H) 04/28/2023 1249   CREATININE 0.96 04/28/2023 1249   CREATININE 0.81 05/26/2022 1046   CALCIUM 9.3 04/28/2023 1249   GFRNONAA >60 03/24/2023 1440   GFRAA 59 (L) 08/01/2020 1549    BNP    Component Value Date/Time   BNP 891.4 (H) 03/24/2023 1445     Imaging:  NM PET Image Initial (PI) Skull Base  To Thigh  Result Date: 08/23/2023 CLINICAL DATA:  Initial treatment strategy for lung nodule. EXAM: NUCLEAR MEDICINE PET SKULL BASE TO THIGH TECHNIQUE: 7.6 mCi F-18 FDG was injected intravenously. Full-ring PET imaging was performed from the skull base  to thigh after the radiotracer. CT data was obtained and used for attenuation correction and anatomic localization. Fasting blood glucose: 94 mg/dl COMPARISON:  CT chest dated 07/26/2023. Virtual colonoscopy dated 08/16/2022. PET-CT dated 03/20/2022. FINDINGS: Mediastinal blood pool activity: SUV max 2.2 Liver activity: SUV max NA NECK: No hypermetabolic lymph nodes in the neck. Incidental CT findings: None. CHEST: 13 mm nodule in the posterior right lung apex (series 7/image 19), max SUV 3.3, previously 9 mm with max SUV 4.3. This remains suspicious for primary bronchogenic carcinoma. No hypermetabolic thoracic lymphadenopathy. Incidental CT findings: Cardiomegaly. Atherosclerotic calcifications of the aortic arch. Moderate three-vessel coronary atherosclerosis. ABDOMEN/PELVIS: No abnormal hypermetabolic activity within the liver, pancreas, adrenal glands, or spleen. No hypermetabolic lymph nodes in the abdomen or pelvis. Incidental CT findings: Atherosclerotic calcifications of the abdominal aorta and branch vessels. SKELETON: No focal hypermetabolic activity to suggest skeletal metastasis. Incidental CT findings: Status post ORIF of the right proximal humerus. Mild degenerative changes of the thoracolumbar spine. IMPRESSION: 13 mm nodule in the posterior right lung apex, mildly progressive, suspicious for primary bronchogenic carcinoma. No evidence of metastatic disease. Electronically Signed   By: Charline Bills M.D.   On: 08/23/2023 23:41    Administration History     None          Latest Ref Rng & Units 03/15/2021   11:45 AM  PFT Results  FVC-Pre L 2.36   FVC-Predicted Pre % 104   FVC-Post L 2.44   FVC-Predicted Post % 107   Pre FEV1/FVC %  % 62   Post FEV1/FCV % % 64   FEV1-Pre L 1.46   FEV1-Predicted Pre % 87   FEV1-Post L 1.56   DLCO uncorrected ml/min/mmHg 9.83   DLCO UNC% % 54   DLCO corrected ml/min/mmHg 9.83   DLCO COR %Predicted % 54   DLVA Predicted % 77   TLC L 4.50   TLC % Predicted % 91   RV % Predicted % 91     No results found for: "NITRICOXIDE"      Assessment & Plan:   Lung nodule Progressive hypermetabolic right lung nodule without evidence of disease spread, consistent with stage I primary bronchogenic carcinoma. High risk for biopsy given age, lung and cardiac disease. Will discuss referral to radiation oncology for empiric SBRT with Dr. Inis Sizer.   Patient Instructions  Continue Albuterol inhaler 2 puffs every 6 hours as needed for shortness of breath or wheezing. Notify if symptoms persist despite rescue inhaler/neb use.  Stop Stiolto. Trial Trelegy 1 puff daily. Brush tongue and rinse mouth afterwards. If this helps your breathing and/or your cough, let me know and I will update your prescription. If not, you can go back to your Stiolto  I will follow up with Dr. Delton Coombes to see if he's talked to Radiation Oncology. If not, I will reach out to them about empiric radiation of your nodule. You should hear from one of [place] this week.  Follow up in 4-5 weeks with Dr. Delton Coombes or Florentina Addison Brienna Bass,NP to see how new inhaler is going. If symptoms do not improve or worsen, please contact office for sooner follow up or seek emergency care.    COPD (chronic obstructive pulmonary disease) (HCC) COPD with high symptom burden, which is likely multifactorial. No exacerbations requiring steroids/abx. She does have a history of elevated peripheral eosinophils. Will trial her with addition of ICS. Provided with samples of Trelegy 100 1 puff daily. Medication education/side effect profile reviewed. Teachback performed. Reassess response  at follow up. Action plan in place. Encouraged to work on graded exercises.    I spent 35  minutes of dedicated to the care of this patient on the date of this encounter to include pre-visit review of records, face-to-face time with the patient discussing conditions above, post visit ordering of testing, clinical documentation with the electronic health record, making appropriate referrals as documented, and communicating necessary findings to members of the patients care team.  Noemi Chapel, NP 08/31/2023  Pt aware and understands NP's role.

## 2023-08-31 NOTE — Assessment & Plan Note (Signed)
COPD with high symptom burden, which is likely multifactorial. No exacerbations requiring steroids/abx. She does have a history of elevated peripheral eosinophils. Will trial her with addition of ICS. Provided with samples of Trelegy 100 1 puff daily. Medication education/side effect profile reviewed. Teachback performed. Reassess response at follow up. Action plan in place. Encouraged to work on graded exercises.

## 2023-08-31 NOTE — Assessment & Plan Note (Signed)
Progressive hypermetabolic right lung nodule without evidence of disease spread, consistent with stage I primary bronchogenic carcinoma. High risk for biopsy given age, lung and cardiac disease. Will discuss referral to radiation oncology for empiric SBRT with Dr. Inis Sizer.   Patient Instructions  Continue Albuterol inhaler 2 puffs every 6 hours as needed for shortness of breath or wheezing. Notify if symptoms persist despite rescue inhaler/neb use.  Stop Stiolto. Trial Trelegy 1 puff daily. Brush tongue and rinse mouth afterwards. If this helps your breathing and/or your cough, let me know and I will update your prescription. If not, you can go back to your Stiolto  I will follow up with Dr. Delton Coombes to see if he's talked to Radiation Oncology. If not, I will reach out to them about empiric radiation of your nodule. You should hear from one of [place] this week.  Follow up in 4-5 weeks with Dr. Delton Coombes or Holly Addison Elias Bordner,NP to see how new inhaler is going. If symptoms do not improve or worsen, please contact office for sooner follow up or seek emergency care.

## 2023-09-01 ENCOUNTER — Telehealth: Payer: Self-pay | Admitting: Nurse Practitioner

## 2023-09-01 ENCOUNTER — Encounter: Payer: Self-pay | Admitting: Nurse Practitioner

## 2023-09-01 NOTE — Progress Notes (Signed)
09/01/2023 Addendum: discussed with Dr. Delton Coombes. Referral placed to radiation oncology to discuss empiric SBRT given progression of PET avid lung nodule and high risk for biopsy.

## 2023-09-02 ENCOUNTER — Telehealth: Payer: Self-pay | Admitting: Radiation Oncology

## 2023-09-02 NOTE — Telephone Encounter (Signed)
Left message for patient to call back to schedule consult per 10/29 referral.

## 2023-09-03 ENCOUNTER — Encounter: Payer: Self-pay | Admitting: Internal Medicine

## 2023-09-03 ENCOUNTER — Other Ambulatory Visit: Payer: Self-pay | Admitting: Internal Medicine

## 2023-09-03 ENCOUNTER — Ambulatory Visit: Payer: Medicare Other | Attending: Internal Medicine | Admitting: Internal Medicine

## 2023-09-03 VITALS — BP 140/62 | HR 62 | Ht 62.0 in | Wt 144.2 lb

## 2023-09-03 DIAGNOSIS — I10 Essential (primary) hypertension: Secondary | ICD-10-CM | POA: Insufficient documentation

## 2023-09-03 DIAGNOSIS — I2089 Other forms of angina pectoris: Secondary | ICD-10-CM | POA: Insufficient documentation

## 2023-09-03 DIAGNOSIS — I25118 Atherosclerotic heart disease of native coronary artery with other forms of angina pectoris: Secondary | ICD-10-CM | POA: Diagnosis not present

## 2023-09-03 DIAGNOSIS — Z01812 Encounter for preprocedural laboratory examination: Secondary | ICD-10-CM | POA: Insufficient documentation

## 2023-09-03 DIAGNOSIS — I2510 Atherosclerotic heart disease of native coronary artery without angina pectoris: Secondary | ICD-10-CM

## 2023-09-03 DIAGNOSIS — E782 Mixed hyperlipidemia: Secondary | ICD-10-CM | POA: Insufficient documentation

## 2023-09-03 DIAGNOSIS — R5383 Other fatigue: Secondary | ICD-10-CM | POA: Insufficient documentation

## 2023-09-03 MED ORDER — METOPROLOL TARTRATE 25 MG PO TABS
25.0000 mg | ORAL_TABLET | Freq: Two times a day (BID) | ORAL | 3 refills | Status: DC
Start: 2023-09-03 — End: 2023-09-04

## 2023-09-03 NOTE — Patient Instructions (Addendum)
Medication Instructions:  NO CHANGES  Take a one-time dose of metoprolol tartrate 25mg  -- two hours before CT test  *If you need a refill on your cardiac medications before your next appointment, please call your pharmacy*   Lab Work: BMET and Lipid Panel before CT test  If you have labs (blood work) drawn today and your tests are completely normal, you will receive your results only by: MyChart Message (if you have MyChart) OR A paper copy in the mail If you have any lab test that is abnormal or we need to change your treatment, we will call you to review the results.   Testing/Procedures:   Your cardiac CT will be scheduled at one of the below locations:   [facility] [address] [place], [place] [address] [phone]  OR  [facility] [address] [place], [place] [address] [phone]  OR   [facility] [address] [place], [place] [address] [phone]  If scheduled at [facility], please arrive at the [facility] and Children's Entrance (Entrance C2) of [facility] 30 minutes prior to test start time. You can use the FREE valet parking offered at entrance C (encouraged to control the heart rate for the test)  Proceed to the [facility] Radiology Department (first floor) to check-in and test prep.  All radiology patients and guests should use entrance C2 at [facility], accessed from [facility], even though the hospital's physical address listed is [address].    If scheduled at [facility] or [facility], please arrive 15 mins early for check-in and test prep.  There is spacious parking and easy access to the radiology department from the [facility] Heart and Vascular entrance. Please enter here and check-in with the desk attendant.   Please follow these instructions carefully  (unless otherwise directed):  An IV will be required for this test and Nitroglycerin will be given.    On the Night Before the Test: Be sure to Drink plenty of water. Do not consume any caffeinated/decaffeinated beverages or chocolate 12 hours prior to your test. Do not take any antihistamines 12 hours prior to your test.  On the Day of the Test: Drink plenty of water until 1 hour prior to the test. Do not eat any food 1 hour prior to test. You may take your regular medications prior to the test.  Take metoprolol (Lopressor) two hours prior to test. If you take Furosemide/Hydrochlorothiazide/Spironolactone, please HOLD on the morning of the test. FEMALES- please wear underwire-free bra if available, avoid dresses & tight clothing  After the Test: Drink plenty of water. After receiving IV contrast, you may experience a mild flushed feeling. This is normal. On occasion, you may experience a mild rash up to 24 hours after the test. This is not dangerous. If this occurs, you can take Benadryl 25 mg and increase your fluid intake. If you experience trouble breathing, this can be serious. If it is severe call 911 IMMEDIATELY. If it is mild, please call our office.   We will call to schedule your test 2-4 weeks out understanding that some insurance companies will need an authorization prior to the service being performed.   For more information and frequently asked questions, please visit our website : [email or web address]  For non-scheduling related questions, please contact the cardiac imaging nurse navigator should you have any questions/concerns: Cardiac Imaging Nurse Navigators  Direct Office Dial: [phone]   For scheduling needs, including cancellations and rescheduling, please call [place], [phone].    Follow-Up: At [facility], you and your health needs are our priority.  As part of our continuing mission to provide you with exceptional heart  care, we have created designated Provider Care Teams.  These Care Teams include your primary Cardiologist (physician) and Advanced Practice Providers (APPs -  Physician Assistants and Nurse Practitioners) who all work together to provide you with the care you need, when you need it.  We recommend signing up for the patient portal called "MyChart".  Sign up information is provided on this After Visit Summary.  MyChart is used to connect with patients for Virtual Visits (Telemedicine).  Patients are able to view lab/test results, encounter notes, upcoming appointments, etc.  Non-urgent messages can be sent to your provider as well.   To learn more about what you can do with MyChart, go to [email or web address].    Your next appointment:    6-8 weeks with Dr. Rennis Golden or Edd Fabian FNP

## 2023-09-03 NOTE — Progress Notes (Signed)
OFFICE NOTE  Chief Complaint:  Progressive fatigue, DOE  Primary Care Physician: Philip Aspen, Limmie Patricia, MD  HPI:  Holly Benson is a 89 y.o. female with a past medial history significant for atrial fibrillation, COPD, hypertension and dyslipidemia.  She is originally from [place] but has been living in [place].  She was established and treated by Dr. Adair Patter, a cardiac electrophysiologist with [facility]. [facility].  She underwent electrical cardioversion and was offered an A. fib ablation, but preferred medical therapy.  She has been on diltiazem and Eliquis as well as losartan for hypertension.  She also takes pravastatin and Spiriva for her COPD.  She denies any chest pain or worsening shortness of breath.  She reports she has had a stress test and an echo in the past, neither which were remarkable.  We will try to obtain those records from her prior cardiologist.  Recently she has noted some increase in palpitations and feeling like her heart is pounding.  This is mostly at night when she is trying to go to bed.  She says it can happen several times a week, but it certainly not daily.  11/10/2017  Holly Benson returns today for follow-up.  She underwent monitoring which demonstrated sinus rhythm with PACs and PVCs.  There were some periods of sinus tachycardia for which correlated with her palpitations.  No evidence of atrial fibrillation was noted.  She says she continues to have episodes of hard heartbeats and feeling like her heart is pounding.  She discussed this further with her daughter-in-law over the holidays who is a Advice worker.  I was provided with a letter which indicated all of her symptoms including twice a week where she wakes up in the middle night feeling suffocated.  She has had episodes of reactive hypoglycemia in the past that would cause elevated heart rate however blood sugars were normal this week.  She went to the ER on December 10 for  elevated heart rate, lightheadedness and elevated blood pressure however workup was unremarkable.  There was a concern for possible dehydration.  In the past her echo in [place] showed a EF was around 40-45% however she said that was more than 5 years ago.  LV function has not been reassessed.  01/12/2018  Holly Benson returns today for follow-up.  When I last saw her I placed her on low-dose metoprolol for additional rate control.  Today she is noted to be in atrial flutter with variable ventricular response and PVCs at 79.  When I walked in I asked her how she was feeling and she said she felt great.  She is asymptomatic with this and was not aware that she was out of rhythm.  Interestingly on her monitor she only showed some PACs and PVCs with predominantly sinus rhythm but was having symptoms of palpitations.  It does not seem that her symptoms necessarily correlate with her arrhythmias.  This seems to be the case when I reviewed her records from [place].  She had both A. fib and a flutter was initially considered for flutter ablation however with her concomitant A. fib this was not pursued.  She also has had chest discomfort symptoms and underwent exercise treadmill testing in January 2017 which was negative for ischemia.  LVEF was 40-45% in the past however based on a repeat echo we performed this past month, her LVEF is now 60-65%.  She does have biatrial enlargement.  03/25/2018  Holly Benson was seen today in follow-up.  She underwent monitoring which showed sinus bradycardia, sinus arrhythmia, intermittent first-degree AV block and junctional rhythm with pauses greater than 2 seconds but less than 3 seconds.  There was short periods of second-degree AV block type II.  Ventricular couplets atrial pairs runs and trigeminy were all noted.  There is no significant atrial fibrillation or ventricular fibrillation.  No atrial flutter was noted.  She reports improvement in her symptoms after adjustment of her beta-blocker  and calcium channel blocker.  Overall she is pleased with her symptoms at this time.  09/22/2018  Holly Benson is seen today for routine follow-up.  She again has some complaints of fatigue.  She is concerned this may be related to her medications.  She has not noted to have any significant bradycardia today in fact heart rates in the 60s with a solitary PVC on EKG.  She reports compliance with medicines her blood pressures been generally well controlled.  There is no evidence of A. fib today.  She is had no bleeding problems on Eliquis.  She is due for a refill of that medication.  She does report fatigue and difficulty staying asleep at night.  She is not noted to snore.  She denies any morning headaches but does oftentimes have to nap during the day.  07/17/2022  Holly Benson is seen today for follow-up.  She had just seen Holly Benson, with electrophysiology about 1 week ago.  She seems to be doing well on dofetilide.  Her EKG showed she was in rhythm without any significant QT changes.  She remains bradycardic and does get some fatigue but seems to be tolerable.  Her Eliquis dose was decreased due to age over 54 and weight less than 60 kg.  She is scheduled to have upcoming surgery at [facility] for hernia.  From a cardiac standpoint I feel she would be at acceptable risk for this.  09/03/2023  Holly Benson is seen today in follow-up.  She reports she has been having some worsening fatigue with exertion as well as dyspnea.  She was recently noted to have a spot on her lung which was thought to be a lung cancer.  She has follow-up with Dr. Roselind Messier in November.  It was not thought by pulmonary that this could cause her fatigue.  She has had intermittent A-fib although recently has not been in A-fib.  EKG today does not show any A-fib.  Of note on her high-resolution CT scan of the chest she does have calcification of the left main and multivessel coronary artery calcification.  This is certainly concerning  for possible obstructive coronary artery disease.  While she is denied any chest pain her symptoms are with exertion and do seem to be worsening.  PMHx:  Past Medical History:  Diagnosis Date   Arrhythmia    Arthritis    Atrial fibrillation (HCC)    Constipation    chronic   COPD (chronic obstructive pulmonary disease) (HCC)    Hyperlipidemia    Hypertension    pulmonary   OSA on CPAP    Osteoarthritis    Osteoporosis    Peripheral neuropathy    Pulmonary hypertension (HCC)    RLS (restless legs syndrome)    Sleep apnea     Past Surgical History:  Procedure Laterality Date   ABDOMINAL AORTOGRAM W/LOWER EXTREMITY N/A 03/21/2020   Procedure: ABDOMINAL AORTOGRAM W/ Bilateral LOWER EXTREMITY Run[place];  Surgeon: Iran Ouch, MD;  Location: [facility] INVASIVE CV [facility];  Service: Cardiovascular;  Laterality:  N/A;   ABDOMINAL HYSTERECTOMY Bilateral    arm surgery Right    Broken arm and has a plate in it   CARDIOVERSION N/A 03/12/2023   Procedure: CARDIOVERSION;  Surgeon: Quintella Reichert, MD;  Location: [facility] INVAS[facility] CV [facility];  Service: Cardiovascular;  Laterality: N/A;   CATARACT EXTRACTION Bilateral    COLONOSCOPY     More than 10 years ago In [facility]   KNEE ARTHROSCOPY Right    PERIPHERAL VASCULAR INTERVENTION Bilateral 03/21/2020   Procedure: PERIPHERAL VASCULAR INTERVENTION;  Surgeon: Iran Ouch, MD;  Location: [facility] INVASIVE CV LAB;  Service: Cardiovascular;  Laterality: Bilateral;  external iliac   TONSILLECTOMY      FAMHx:  Family History  Problem Relation Age of Onset   Suicidality Father    Stroke Maternal Grandfather    Hypertension Sister    Colon cancer Neg Hx    Esophageal cancer Neg Hx    Pancreatic cancer Neg Hx    Stomach cancer Neg Hx    Liver disease Neg Hx     SOCHx:   reports that she quit smoking about 31 years ago. Her smoking use included cigarettes. She started smoking about 68 years ago. She has a 55.5 pack-year smoking history. She has never  used smokeless tobacco. She reports current alcohol use of about 7.0 standard drinks of alcohol per week. She reports that she does not use drugs.  ALLERGIES:  Allergies  Allergen Reactions   Lovenox [Enoxaparin Sodium] Hives and Rash    PT STATES SHE BROKE OUT IN A RASH HEAD TO TOE AND LASTED ABOUT 3 WEEKS    Metoprolol Succinate [Metoprolol] Other (See Comments)    Muscle aches, hand pain, and tingling    ROS: Pertinent items noted in HPI and remainder of comprehensive ROS otherwise negative.  HOME MEDS: Current Outpatient Medications on File Prior to Visit  Medication Sig Dispense Refill   acetaminophen (TYLENOL) 325 MG tablet Take 650 mg by mouth as needed for moderate pain or headache.     albuterol (VENTOLIN HFA) 108 (90 Base) MCG/ACT inhaler INHALE TWO PUFFS BY MOUTH EVERY 6 HOURS AS NEEDED FOR WHEEZING OR FOR SHORTNESS OF BREATH 18 g 6   amLODipine (NORVASC) 2.5 MG tablet Take 1 tablet (2.5 mg total) by mouth daily. 90 tablet 3   amLODipine (NORVASC) 5 MG tablet Take 1 tablet (5 mg total) by mouth daily. (Patient taking differently: Take 5 mg by mouth See admin instructions. Take with 2.5 mg in the morning for a total of 7.5 mg daily) 30 tablet 7   apixaban (ELIQUIS) 5 MG TABS tablet Take 1 tablet (5 mg total) by mouth 2 (two) times daily. 20 tablet 0   bisoprolol (ZEBETA) 10 MG tablet TAKE 1 TABLET BY MOUTH AT BEDTIME 30 tablet 0   Cholecalciferol (VITAMIN D3 MAXIMUM STRENGTH) 125 MCG (5000 UT) capsule Take 5,000 Units by mouth daily.     clonazePAM (KLONOPIN) 0.5 MG tablet Take 0.5 tablets (0.25 mg total) by mouth at bedtime as needed for anxiety. (Patient taking differently: Take 0.25 mg by mouth at bedtime as needed (Restless leg).) 30 tablet 2   docusate sodium (COLACE) 100 MG capsule Take 300 mg by mouth at bedtime.     dofetilide (TIKOSYN) 250 MCG capsule TAKE 1 CAPSULE BY MOUTH 2 TIMES A DAY. 180 capsule 2   furosemide (LASIX) 20 MG tablet Take 2 tablets (40 mg total) by  mouth 4 (four) times a week AND 1 tablet (20  mg total) 3 (three) times a week. Take one tablet by mouth on Monday, Wednesday and Friday. 36 tablet 1   irbesartan (AVAPRO) 300 MG tablet Take 1 tablet (300 mg total) by mouth daily. 90 tablet 1   Misc Natural Products (COLON CLEANSE) CAPS Take 4 capsules by mouth as needed (constipation).     Multiple Vitamins-Minerals (PRESERVISION AREDS 2) CAPS Take 1 tablet by mouth 2 (two) times daily.     polyethylene glycol (MIRALAX / GLYCOLAX) 17 g packet Take 17 g by mouth at bedtime.     PRESCRIPTION MEDICATION at bedtime as needed (Sometimes). CPAP     rOPINIRole (REQUIP) 0.5 MG tablet One in AM and one after 3 Pm and one after dinner 8 Pm (Patient taking differently: Take 0.5 mg by mouth in the morning, at noon, in the evening, and at bedtime.) 90 tablet 5   STIOLTO RESPIMAT 2.5-2.5 MCG/ACT AERS USE 2 INHALATIONS ORALLY   EVERY MORNING 12 g 3   Current Facility-Administered Medications on File Prior to Visit  Medication Dose Route Frequency Provider Last Rate Last Admin   technetium tetrofosmin (TC-MYOVIEW) injection 31.9 millicurie  31.9 millicurie Intravenous Once PRN Chilton Si, MD        LABS/IMAGING: No results found for this or any previous visit (from the past 48 hour(s)). No results found.  LIPID PANEL:    Component Value Date/Time   CHOL 231 (H) 05/26/2022 1046   CHOL 149 04/11/2020 0838   TRIG 98 05/26/2022 1046   HDL 86 05/26/2022 1046   HDL 69 04/11/2020 0838   CHOLHDL 2.7 05/26/2022 1046   LDLCALC 125 (H) 05/26/2022 1046     WEIGHTS: Wt Readings from Last 3 Encounters:  09/03/23 144 lb 3.2 oz (65.4 kg)  08/31/23 144 lb (65.3 kg)  07/30/23 140 lb (63.5 kg)    VITALS: BP (!) 140/62 (BP Location: Left Arm, Patient Position: Sitting, Cuff Size: Normal)   Pulse 62   Ht 5\' 2"  (1.575 m)   Wt 144 lb 3.2 oz (65.4 kg)   SpO2 91%   BMI 26.37 kg/m   EXAM: General appearance: alert and no distress Neck: no carotid bruit,  no JVD and thyroid not enlarged, symmetric, no tenderness/mass/nodules Lungs: clear to auscultation bilaterally Heart: regular rate and rhythm Abdomen: soft, non-tender; bowel sounds normal; no masses,  no organomegaly Extremities: extremities normal, atraumatic, no cyanosis or edema Pulses: 2+ and symmetric Skin: Skin color, texture, turgor normal. No rashes or lesions Neurologic: Grossly normal Psych: Pleasant  EKG: EKG Interpretation Date/Time:  Thursday September 03 2023 10:51:10 EDT Ventricular Rate:  62 PR Interval:  166 QRS Duration:  92 QT Interval:  472 QTC Calculation: 479 R Axis:   -3  Text Interpretation: Normal sinus rhythm with sinus arrhythmia Incomplete right bundle branch block When compared with ECG of 24-Mar-2023 14:16, No significant change was found Confirmed by Zoila Shutter [phone]) on 09/03/2023 11:14:36 AM    ASSESSMENT: Progressive fatigue and dyspnea on exertion Multivessel and left main coronary artery calcification History of atrial fibrillation, status post DCCV Paroxysmal atrial flutter Palpitations CHADSVASC score of 4 - on Eliquis Hypertension  Dyslipidemia  Lung cancer  PLAN: 1.   Holly Benson has noted progressive fatigue and dyspnea on exertion.  Recent CT scan that was used to follow-up on her lung cancer showed left main and multivessel coronary artery calcification.  She could have obstructive coronary disease.  I would recommend a CT coronary angiogram to further evaluate this.  Plan  follow-up with me afterwards to discuss.  Chrystie Nose, MD, [facility], FACP  [place]  [facility]  Medical Director of the [facility]  Card[facility]ovascular [facility] Reduction [facility] Diplomate of the American Board of Clinical Lipidology Attending Cardiologist  Direct Dial: [phone]  Fax: [phone]  Website:  [email or web address] Nicely Joia Doyle 09/03/2023, 11:14 AM

## 2023-09-04 ENCOUNTER — Encounter: Payer: Self-pay | Admitting: Internal Medicine

## 2023-09-04 LAB — BASIC METABOLIC PANEL
BUN/Creatinine Ratio: 24 (ref 12–28)
BUN: 22 mg/dL (ref 8–27)
CO2: 23 mmol/L (ref 20–29)
Calcium: 9.3 mg/dL (ref 8.7–10.3)
Chloride: 102 mmol/L (ref 96–106)
Creatinine, Ser: 0.9 mg/dL (ref 0.57–1.00)
Glucose: 92 mg/dL (ref 70–99)
Potassium: 4.6 mmol/L (ref 3.5–5.2)
Sodium: 140 mmol/L (ref 134–144)
eGFR: 61 mL/min/{1.73_m2} (ref >59)

## 2023-09-04 LAB — LIPID PANEL
Chol/HDL Ratio: 3.2 ratio (ref 0.0–4.4)
Cholesterol, Total: 230 mg/dL — ABNORMAL HIGH (ref 100–199)
HDL: 72 mg/dL (ref >39)
LDL Chol Calc (NIH): 135 mg/dL — ABNORMAL HIGH (ref 0–99)
Triglycerides: 129 mg/dL (ref 0–149)
VLDL Cholesterol Cal: 23 mg/dL (ref 5–40)

## 2023-09-04 MED ORDER — METOPROLOL TARTRATE 25 MG PO TABS: 25.0000 mg | ORAL_TABLET | Freq: Once | ORAL | 0 refills | Status: DC

## 2023-09-04 NOTE — Telephone Encounter (Signed)
E-sent prescription - Metoprolol tartrate 25 mg x 1 to local pharmacy   Discontinue   Metoprolol 25 mg twice a a day

## 2023-09-07 NOTE — Telephone Encounter (Signed)
Pt has been notified and oncology has reached out. Nfn

## 2023-09-09 NOTE — Progress Notes (Signed)
Radiation Oncology         [phone] ________________________________  Initial Outpatient Consultation  Name: Holly Benson MRN: [id number]  Date: 09/10/2023  DOB: 01-17-1933  WU:[id number] Priscella Mann, MD  Leslye Peer, MD   REFERRING PHYSICIAN: Leslye Peer, MD  DIAGNOSIS: {There were no encounter diagnoses. (Refresh or delete this SmartLink)}  Slowly enlarging RUL pulmonary nodule, with features suggestive of non-small cell lung cancer (non-biopsy proven)  HISTORY OF PRESENT ILLNESS::Holly Benson is a 89 y.o. female former smoker with prior history of COPD. Today, she is accompanied by ***. she is seen as a courtesy of Dr. Delton Coombes for an opinion concerning radiation therapy as part of management for her enlarging RUL pulmonary nodule concerning for malignancy.   Several years ago, she presented for a CT AP on 01/04/2021 as part of work up for an abdominal wall hernia which incidentally revealed patchy multifocal airspace opacities at both lung bases, thought to likely represent an infectious or inflammatory etiology.   She was subsequently referred to Dr. Delton Coombes for further evaluation and had a chest CT performed on 01/15/21 which showed multiple irregular masslike and nodular foci of consolidation in the peripheral peribronchovascular lungs bilaterally, predominantly in the mid to lower lungs, with associated mild-to-moderate bronchiectasis and patchy tree-in-bud opacities. At the time, these findings were thought to favor recurrent or chronic bronchopneumonia possibly due to aspiration or atypical mycobacterial infection  Based on Dr. Kavin Leech recommendation, the patient continued with surveillance imaging and presented for  repeat imaging in May of 2022 which actually showed improvement in the nodular opacities, with mild residual bandlike opacity noted in the right lower lobe and to a lesser extent in the lingula and anteriorly in the left lower lobe.    Follow-up chest CT on 02/25/22 however showed an increase in size of the solid pulmonary nodule in the RUL, adjacent to an area of pleuroparenchymal scarring. The other lower lung opacities otherwise resolved.   Given the interval increase in size of the RUL nodule, a PET scan was performed on 03/20/22 which showed hypermetabolism associated with RIL nodule, concerning for primary bronchogenic carcinoma. PET otherwise showed no evidence of distant or nodal metastatic disease.   Despite an increase in size of suspicious RUL nodule, and given the patient's advance age, the patient proceeded with a conservative approach with routine serial imaging.   Subsequent serial imaging showed stability of the nodule (dates and results detailed as follows): -- A super D chest CT on 07/12/22 which showed interval stability of the posterior RUL nodule -- Super D chest CT on 01/08/23 again showed interval stability of the RUL nodule.   However, a routine chest CT on 07/14/23 showed a very slight interval increase in size of the RUL nodule, measuring 1.3 x 1.1 cm, previously 1.2 x 1.0 cm. CT otherwise showed no evidence of mediastinal adenopathy or evidence of metastatic disease.   A repeat PET scan was subsequently performed on 08/13/23 which showed no evidence of metastatic disease, and mild progression of the 13 mm nodule in the RUL.   Due to her advanced age and respiratory status, Dr. Delton Coombes does not recommended tissue sampling for diagnostic confirmation. Based on these factors, her best option for treatment moving forward would likely be radiation therapy which we will discuss in detail today.    PREVIOUS RADIATION THERAPY: No  PAST MEDICAL HISTORY:  Past Medical History:  Diagnosis Date   Arrhythmia    Arthritis    Atrial  fibrillation (HCC)    Constipation    chronic   COPD (chronic obstructive pulmonary disease) (HCC)    Hyperlipidemia    Hypertension    pulmonary   OSA on CPAP     Osteoarthritis    Osteoporosis    Peripheral neuropathy    Pulmonary hypertension (HCC)    RLS (restless legs syndrome)    Sleep apnea     PAST SURGICAL HISTORY: Past Surgical History:  Procedure Laterality Date   ABDOMINAL AORTOGRAM W/LOWER EXTREMITY N/A 03/21/2020   Procedure: ABDOMINAL AORTOGRAM W/ Bilateral LOWER EXTREMITY Run[place];  Surgeon: Iran Ouch, MD;  Location: [facility];  Service: Cardiovascular;  Laterality: N/A;   ABDOMINAL HYSTERECTOMY Bilateral    arm surgery Right    Broken arm and has a plate in it   CARDIOVERSION N/A 03/12/2023   Procedure: CARDIOVERSION;  Surgeon: Quintella Reichert, MD;  Location: [facility];  Service: Cardiovascular;  Laterality: N/A;   CATARACT EXTRACTION Bilateral    COLONOSCOPY     More than 10 years ago In [facility]   KNEE ARTHROSCOPY Right    PERIPHERAL VASCULAR INTERVENTION Bilateral 03/21/2020   Procedure: PERIPHERAL VASCULAR INTERVENTION;  Surgeon: Iran Ouch, MD;  Location: [facility];  Service: Cardiovascular;  Laterality: Bilateral;  external iliac   TONSILLECTOMY      FAMILY HISTORY:  Family History  Problem Relation Age of Onset   Suicidality Father    Stroke Maternal Grandfather    Hypertension Sister    Colon cancer Neg Hx    Esophageal cancer Neg Hx    Pancreatic cancer Neg Hx    Stomach cancer Neg Hx    Liver disease Neg Hx     SOCIAL HISTORY:  Social History   Tobacco Use   Smoking status: Former    Current packs/day: 0.00    Average packs/day: 1.5 packs/day for 37.0 years (55.5 ttl pk-yrs)    Types: Cigarettes    Start date: 41    Quit date: 11/04/1991    Years since quitting: 31.8   Smokeless tobacco: Never   Tobacco comments:    Former smoker 03/14/22  Vaping Use   Vaping status: Never Used  Substance Use Topics   Alcohol use: Yes    Alcohol/week: 7.0 standard drinks of alcohol    Types: 7 Glasses of wine per week    Comment: 1 glass of wine nightly 03/14/22    Drug use: No    ALLERGIES:  Allergies  Allergen Reactions   Lovenox [Enoxaparin Sodium] Hives and Rash    PT STATES SHE BROKE OUT IN A RASH HEAD TO TOE AND LASTED ABOUT 3 WEEKS    Metoprolol Succinate [Metoprolol] Other (See Comments)    Muscle aches, hand pain, and tingling    MEDICATIONS:  Current Outpatient Medications  Medication Sig Dispense Refill   acetaminophen (TYLENOL) 325 MG tablet Take 650 mg by mouth as needed for moderate pain or headache.     albuterol (VENTOLIN HFA) 108 (90 Base) MCG/ACT inhaler INHALE TWO PUFFS BY MOUTH EVERY 6 HOURS AS NEEDED FOR WHEEZING OR FOR SHORTNESS OF BREATH 18 g 6   amLODipine (NORVASC) 2.5 MG tablet Take 1 tablet (2.5 mg total) by mouth daily. 90 tablet 3   amLODipine (NORVASC) 5 MG tablet Take 1 tablet (5 mg total) by mouth daily. (Patient taking differently: Take 5 mg by mouth See admin instructions. Take with 2.5 mg in the morning for a total of  7.5 mg daily) 30 tablet 7   apixaban (ELIQUIS) 5 MG TABS tablet Take 1 tablet (5 mg total) by mouth 2 (two) times daily. 20 tablet 0   bisoprolol (ZEBETA) 10 MG tablet TAKE 1 TABLET BY MOUTH AT BEDTIME 30 tablet 0   Cholecalciferol (VITAMIN D3 MAXIMUM STRENGTH) 125 MCG (5000 UT) capsule Take 5,000 Units by mouth daily.     clonazePAM (KLONOPIN) 0.5 MG tablet Take 0.5 tablets (0.25 mg total) by mouth at bedtime as needed for anxiety. (Patient taking differently: Take 0.25 mg by mouth at bedtime as needed (Restless leg).) 30 tablet 2   docusate sodium (COLACE) 100 MG capsule Take 300 mg by mouth at bedtime.     dofetilide (TIKOSYN) 250 MCG capsule TAKE 1 CAPSULE BY MOUTH 2 TIMES A DAY. 180 capsule 2   furosemide (LASIX) 20 MG tablet Take 2 tablets (40 mg total) by mouth 4 (four) times a week AND 1 tablet (20 mg total) 3 (three) times a week. Take one tablet by mouth on Monday, Wednesday and Friday. 36 tablet 1   irbesartan (AVAPRO) 300 MG tablet Take 1 tablet (300 mg total) by mouth daily. 90 tablet 1    metoprolol tartrate (LOPRESSOR) 25 MG tablet Take 1 tablet (25 mg total) by mouth once for 1 dose. Two hours prior to  cardiac test 1 tablet 0   Misc Natural Products (COLON CLEANSE) CAPS Take 4 capsules by mouth as needed (constipation).     Multiple Vitamins-Minerals (PRESERVISION AREDS 2) CAPS Take 1 tablet by mouth 2 (two) times daily.     polyethylene glycol (MIRALAX / GLYCOLAX) 17 g packet Take 17 g by mouth at bedtime.     PRESCRIPTION MEDICATION at bedtime as needed (Sometimes). CPAP     rOPINIRole (REQUIP) 0.5 MG tablet One in AM and one after 3 Pm and one after dinner 8 Pm (Patient taking differently: Take 0.5 mg by mouth in the morning, at noon, in the evening, and at bedtime.) 90 tablet 5   STIOLTO RESPIMAT 2.5-2.5 MCG/ACT AERS USE 2 INHALATIONS ORALLY   EVERY MORNING 12 g 3   No current facility-administered medications for this encounter.   Facility-Administered Medications Ordered in Other Encounters  Medication Dose Route Frequency Provider Last Rate Last Admin   technetium tetrofosmin (TC-MYOVIEW) injection 31.9 millicurie  31.9 millicurie Intravenous Once PRN Chilton Si, MD        REVIEW OF SYSTEMS:  A 10+ POINT REVIEW OF SYSTEMS WAS OBTAINED including neurology, dermatology, psychiatry, cardiac, respiratory, lymph, extremities, GI, GU, musculoskeletal, constitutional, reproductive, HEENT. \   PHYSICAL EXAM:  vitals were not taken for this visit.   General: Alert and oriented, in no acute distress HEENT: Head is normocephalic. Extraocular movements are intact. Oropharynx is clear. Neck: Neck is supple, no palpable cervical or supraclavicular lymphadenopathy. Heart: Regular in rate and rhythm with no murmurs, rubs, or gallops. Chest: Clear to auscultation bilaterally, with no rhonchi, wheezes, or rales. Abdomen: Soft, nontender, nondistended, with no rigidity or guarding. Extremities: No cyanosis or edema. Lymphatics: see Neck Exam Skin: No concerning  lesions. Musculoskeletal: symmetric strength and muscle tone throughout. Neurologic: Cranial nerves II through XII are grossly intact. No obvious focalities. Speech is fluent. Coordination is intact. Psychiatric: Judgment and insight are intact. Affect is appropriate. ***  ECOG = ***  0 - Asymptomatic (Fully active, able to carry on all predisease activities without restriction)  1 - Symptomatic but completely ambulatory (Restricted in physically strenuous activity but ambulatory and able  to carry out work of a light or sedentary nature. For example, light housework, office work)  2 - Symptomatic, <50% in bed during the day (Ambulatory and capable of all self care but unable to carry out any work activities. Up and about more than 50% of waking hours)  3 - Symptomatic, >50% in bed, but not bedbound (Capable of only limited self-care, confined to bed or chair 50% or more of waking hours)  4 - Bedbound (Completely disabled. Cannot carry on any self-care. Totally confined to bed or chair)  5 - Death   Santiago Glad MM, Creech RH, Tormey DC, et al. [phone]). "Toxicity and response criteria of the [facility] Group". Am. Evlyn Clines. Oncol. 5 (6): 649-55  LABORATORY DATA:  Lab Results  Component Value Date   WBC 6.8 01/01/2023   HGB 13.9 03/12/2023   HCT 41.0 03/12/2023   MCV 97 01/01/2023   PLT 306 01/01/2023   NEUTROABS 5,498 05/26/2022   Lab Results  Component Value Date   NA 140 09/03/2023   K 4.6 09/03/2023   CL 102 09/03/2023   CO2 23 09/03/2023   GLUCOSE 92 09/03/2023   BUN 22 09/03/2023   CREATININE 0.90 09/03/2023   CALCIUM 9.3 09/03/2023      RADIOGRAPHY: NM PET Image Initial (PI) Skull Base To Thigh  Result Date: 08/23/2023 CLINICAL DATA:  Initial treatment strategy for lung nodule. EXAM: NUCLEAR MEDICINE PET SKULL BASE TO THIGH TECHNIQUE: 7.6 mCi F-18 FDG was injected intravenously. Full-ring PET imaging was performed from the skull base to thigh after the  radiotracer. CT data was obtained and used for attenuation correction and anatomic localization. Fasting blood glucose: 94 mg/dl COMPARISON:  CT chest dated 07/26/2023. Virtual colonoscopy dated 08/16/2022. PET-CT dated 03/20/2022. FINDINGS: Mediastinal blood pool activity: SUV max 2.2 Liver activity: SUV max NA NECK: No hypermetabolic lymph nodes in the neck. Incidental CT findings: None. CHEST: 13 mm nodule in the posterior right lung apex (series 7/image 19), max SUV 3.3, previously 9 mm with max SUV 4.3. This remains suspicious for primary bronchogenic carcinoma. No hypermetabolic thoracic lymphadenopathy. Incidental CT findings: Cardiomegaly. Atherosclerotic calcifications of the aortic arch. Moderate three-vessel coronary atherosclerosis. ABDOMEN/PELVIS: No abnormal hypermetabolic activity within the liver, pancreas, adrenal glands, or spleen. No hypermetabolic lymph nodes in the abdomen or pelvis. Incidental CT findings: Atherosclerotic calcifications of the abdominal aorta and branch vessels. SKELETON: No focal hypermetabolic activity to suggest skeletal metastasis. Incidental CT findings: Status post ORIF of the right proximal humerus. Mild degenerative changes of the thoracolumbar spine. IMPRESSION: 13 mm nodule in the posterior right lung apex, mildly progressive, suspicious for primary bronchogenic carcinoma. No evidence of metastatic disease. Electronically Signed   By: Charline Bills M.D.   On: 08/23/2023 23:41      IMPRESSION: Slowly enlarging RUL pulmonary nodule, with features suggestive of non-small cell lung cancer (non-biopsy proven)  ***  Today, I talked to the patient and family about the findings and work-up thus far.  We discussed the natural history of *** and general treatment, highlighting the role of radiotherapy in the management.  We discussed the available radiation techniques, and focused on the details of logistics and delivery.  We reviewed the anticipated acute and late  sequelae associated with radiation in this setting.  The patient was encouraged to ask questions that I answered to the best of my ability. *** A patient consent form was discussed and signed.  We retained a copy for our records.  The patient would  like to proceed with radiation and will be scheduled for CT simulation.  PLAN: ***    *** minutes of total time was spent for this patient encounter, including preparation, face-to-face counseling with the patient and coordination of care, physical exam, and documentation of the encounter.   ------------------------------------------------  Billie Lade, PhD, MD  This document serves as a record of services personally performed by Antony Blackbird, MD. It was created on his behalf by Neena Rhymes, a trained medical scribe. The creation of this record is based on the scribe's personal observations and the provider's statements to them. This document has been checked and approved by the attending provider.

## 2023-09-09 NOTE — Progress Notes (Signed)
Location of tumor and Histology per Pathology Report: Right lung  Biopsy:  Patient is not a surgical candidate at this time.   Past/Anticipated interventions by surgeon, if any: right    Past/Anticipated interventions by medical oncology, if any: none at this time.     Pain issues, if any:  no   SAFETY ISSUES: Prior radiation? no Pacemaker/ICD? no Possible current pregnancy? no Is the patient on methotrexate? no  Current Complaints / other details:  BP (!) 128/51   Pulse 70   Temp 97.6 F (36.4 C)   Resp 18   Ht 5\' 2"  (1.575 m)   Wt 144 lb 3.2 oz (65.4 kg)   SpO2 96%   BMI 26.37 kg/m

## 2023-09-10 ENCOUNTER — Ambulatory Visit (INDEPENDENT_AMBULATORY_CARE_PROVIDER_SITE_OTHER): Payer: Medicare Other | Admitting: Internal Medicine

## 2023-09-10 ENCOUNTER — Encounter: Payer: Self-pay | Admitting: Internal Medicine

## 2023-09-10 ENCOUNTER — Encounter: Payer: Self-pay | Admitting: Radiation Oncology

## 2023-09-10 ENCOUNTER — Ambulatory Visit
Admission: RE | Admit: 2023-09-10 | Discharge: 2023-09-10 | Disposition: A | Discharge status: Home / Self Care | Payer: Medicare Other | Source: Ambulatory Visit | Attending: Radiation Oncology | Admitting: Radiation Oncology

## 2023-09-10 VITALS — BP 130/80 | HR 67 | Temp 97.9°F | Ht 62.0 in | Wt 144.0 lb

## 2023-09-10 VITALS — BP 128/51 | HR 70 | Temp 97.6°F | Resp 18 | Ht 62.0 in | Wt 144.2 lb

## 2023-09-10 DIAGNOSIS — I2510 Atherosclerotic heart disease of native coronary artery without angina pectoris: Secondary | ICD-10-CM | POA: Insufficient documentation

## 2023-09-10 DIAGNOSIS — Z7901 Long term (current) use of anticoagulants: Secondary | ICD-10-CM | POA: Diagnosis not present

## 2023-09-10 DIAGNOSIS — I10 Essential (primary) hypertension: Secondary | ICD-10-CM | POA: Diagnosis not present

## 2023-09-10 DIAGNOSIS — I272 Pulmonary hypertension, unspecified: Secondary | ICD-10-CM | POA: Diagnosis not present

## 2023-09-10 DIAGNOSIS — G4733 Obstructive sleep apnea (adult) (pediatric): Secondary | ICD-10-CM | POA: Insufficient documentation

## 2023-09-10 DIAGNOSIS — E785 Hyperlipidemia, unspecified: Secondary | ICD-10-CM | POA: Diagnosis not present

## 2023-09-10 DIAGNOSIS — C3411 Malignant neoplasm of upper lobe, right bronchus or lung: Secondary | ICD-10-CM | POA: Diagnosis not present

## 2023-09-10 DIAGNOSIS — Z0000 Encounter for general adult medical examination without abnormal findings: Secondary | ICD-10-CM

## 2023-09-10 DIAGNOSIS — I119 Hypertensive heart disease without heart failure: Secondary | ICD-10-CM | POA: Insufficient documentation

## 2023-09-10 DIAGNOSIS — J449 Chronic obstructive pulmonary disease, unspecified: Secondary | ICD-10-CM | POA: Insufficient documentation

## 2023-09-10 DIAGNOSIS — G2581 Restless legs syndrome: Secondary | ICD-10-CM | POA: Diagnosis not present

## 2023-09-10 DIAGNOSIS — Z87891 Personal history of nicotine dependence: Secondary | ICD-10-CM | POA: Insufficient documentation

## 2023-09-10 DIAGNOSIS — R911 Solitary pulmonary nodule: Secondary | ICD-10-CM | POA: Diagnosis not present

## 2023-09-10 DIAGNOSIS — E782 Mixed hyperlipidemia: Secondary | ICD-10-CM

## 2023-09-10 DIAGNOSIS — I700 Atherosclerosis of aorta: Secondary | ICD-10-CM | POA: Insufficient documentation

## 2023-09-10 DIAGNOSIS — M810 Age-related osteoporosis without current pathological fracture: Secondary | ICD-10-CM | POA: Diagnosis not present

## 2023-09-10 DIAGNOSIS — M1990 Unspecified osteoarthritis, unspecified site: Secondary | ICD-10-CM | POA: Insufficient documentation

## 2023-09-10 DIAGNOSIS — N8110 Cystocele, unspecified: Secondary | ICD-10-CM

## 2023-09-10 DIAGNOSIS — I4891 Unspecified atrial fibrillation: Secondary | ICD-10-CM | POA: Insufficient documentation

## 2023-09-10 DIAGNOSIS — Z79899 Other long term (current) drug therapy: Secondary | ICD-10-CM | POA: Diagnosis not present

## 2023-09-10 DIAGNOSIS — Z780 Asymptomatic menopausal state: Secondary | ICD-10-CM | POA: Diagnosis not present

## 2023-09-10 DIAGNOSIS — N814 Uterovaginal prolapse, unspecified: Secondary | ICD-10-CM

## 2023-09-10 NOTE — Progress Notes (Signed)
Established Patient Office Visit     CC/Reason for Visit: Subsequent Medicare wellness visit  HPI: Holly Benson is a 89 y.o. female who is coming in today for the above mentioned reasons. Past Medical History is significant for: A-fib, hypertension, hyperlipidemia, COPD, peripheral arterial disease status post bilateral iliac stents.  She was recently found to have a slowly enlarging pulmonary nodule that appears to be consistent with a primary bronchogenic carcinoma.  PET scan did not show any other hypermetabolic lesions.  She has an appointment with radiation oncology today.  She is otherwise feeling well.   Past Medical/Surgical History: Past Medical History:  Diagnosis Date   Arrhythmia    Arthritis    Atrial fibrillation (HCC)    Constipation    chronic   COPD (chronic obstructive pulmonary disease) (HCC)    Hyperlipidemia    Hypertension    pulmonary   OSA on CPAP    Osteoarthritis    Osteoporosis    Peripheral neuropathy    Pulmonary hypertension (HCC)    RLS (restless legs syndrome)    Sleep apnea     Past Surgical History:  Procedure Laterality Date   ABDOMINAL AORTOGRAM W/LOWER EXTREMITY N/A 03/21/2020   Procedure: ABDOMINAL AORTOGRAM W/ Bilateral LOWER EXTREMITY Runoff;  Surgeon: Iran Ouch, MD;  [facility];  Service: Cardiovascular;  Laterality: N/A;   ABDOMINAL HYSTERECTOMY Bilateral    arm surgery Right    Broken arm and has a plate in it   CARDIOVERSION N/A 03/12/2023   Procedure: CARDIOVERSION;  Surgeon: Quintella Reichert, MD;  [facility]: [facility];  Service: Cardiovascular;  Laterality: N/A;   CATARACT EXTRACTION Bilateral    COLONOSCOPY     More than 10 years ago In [facility]   KNEE ARTHROSCOPY Right    PERIPHERAL VASCULAR INTERVENTION Bilateral 03/21/2020   Procedure: PERIPHERAL VASCULAR INTERVENTION;  Surgeon: Iran Ouch, MD;  [facility]: [facility];  Service: Cardiovascular;  Laterality:  Bilateral;  external iliac   TONSILLECTOMY      Social History:  reports that she quit smoking about 31 years ago. Her smoking use included cigarettes. She started smoking about 68 years ago. She has a 55.5 pack-year smoking history. She has never used smokeless tobacco. She reports current alcohol use of about 7.0 standard drinks of alcohol per week. She reports that she does not use drugs.  Allergies: Allergies  Allergen Reactions   Lovenox [Enoxaparin Sodium] Hives and Rash    PT STATES SHE BROKE OUT IN A RASH HEAD TO TOE AND LASTED ABOUT 3 WEEKS    Metoprolol Succinate [Metoprolol] Other (See Comments)    Muscle aches, hand pain, and tingling    Family History:  Family History  Problem Relation Age of Onset   Suicidality Father    Stroke Maternal Grandfather    Hypertension Sister    Colon cancer Neg Hx    Esophageal cancer Neg Hx    Pancreatic cancer Neg Hx    Stomach cancer Neg Hx    Liver disease Neg Hx      Current Outpatient Medications:    acetaminophen (TYLENOL) 325 MG tablet, Take 650 mg by mouth as needed for moderate pain or headache., Disp: , Rfl:    albuterol (VENTOLIN HFA) 108 (90 Base) MCG/ACT inhaler, INHALE TWO PUFFS BY MOUTH EVERY 6 HOURS AS NEEDED FOR WHEEZING OR FOR SHORTNESS OF BREATH, Disp: 18 g, Rfl: 6   amLODipine (NORVASC) 5  MG tablet, Take 1 tablet (5 mg total) by mouth daily., Disp: 30 tablet, Rfl: 7   apixaban (ELIQUIS) 5 MG TABS tablet, Take 1 tablet (5 mg total) by mouth 2 (two) times daily., Disp: 20 tablet, Rfl: 0   bisoprolol (ZEBETA) 10 MG tablet, TAKE 1 TABLET BY MOUTH AT BEDTIME, Disp: 30 tablet, Rfl: 0   Cholecalciferol (VITAMIN D3 MAXIMUM STRENGTH) 125 MCG (5000 UT) capsule, Take 5,000 Units by mouth daily., Disp: , Rfl:    clonazePAM (KLONOPIN) 0.5 MG tablet, Take 0.5 tablets (0.25 mg total) by mouth at bedtime as needed for anxiety. (Patient taking differently: Take 0.25 mg by mouth at bedtime as needed (Restless leg).), Disp: 30 tablet,  Rfl: 2   docusate sodium (COLACE) 100 MG capsule, Take 300 mg by mouth at bedtime., Disp: , Rfl:    dofetilide (TIKOSYN) 250 MCG capsule, TAKE 1 CAPSULE BY MOUTH 2 TIMES A DAY., Disp: 180 capsule, Rfl: 2   furosemide (LASIX) 20 MG tablet, Take 2 tablets (40 mg total) by mouth 4 (four) times a week AND 1 tablet (20 mg total) 3 (three) times a week. Take one tablet by mouth on Monday, Wednesday and Friday., Disp: 36 tablet, Rfl: 1   irbesartan (AVAPRO) 300 MG tablet, Take 1 tablet (300 mg total) by mouth daily., Disp: 90 tablet, Rfl: 1   Misc Natural Products (COLON CLEANSE) CAPS, Take 4 capsules by mouth as needed (constipation)., Disp: , Rfl:    Multiple Vitamins-Minerals (PRESERVISION AREDS 2) CAPS, Take 1 tablet by mouth 2 (two) times daily., Disp: , Rfl:    polyethylene glycol (MIRALAX / GLYCOLAX) 17 g packet, Take 17 g by mouth at bedtime., Disp: , Rfl:    PRESCRIPTION MEDICATION, at bedtime as needed (Sometimes). CPAP, Disp: , Rfl:    rOPINIRole (REQUIP) 0.5 MG tablet, One in AM and one after 3 Pm and one after dinner 8 Pm (Patient taking differently: Take 0.5 mg by mouth in the morning, at noon, in the evening, and at bedtime.), Disp: 90 tablet, Rfl: 5   metoprolol tartrate (LOPRESSOR) 25 MG tablet, Take 1 tablet (25 mg total) by mouth once for 1 dose. Two hours prior to  cardiac test, Disp: 1 tablet, Rfl: 0 No current facility-administered medications for this visit.  Facility-Administered Medications Ordered in Other Visits:    technetium tetrofosmin (TC-MYOVIEW) injection 31.9 millicurie, 31.9 millicurie, Intravenous, Once PRN, Chilton Si, MD  Review of Systems:  Negative unless indicated in HPI.   Physical Exam: Vitals:   09/10/23 0830  BP: 130/80  Pulse: 67  Temp: 97.9 F (36.6 C)  TempSrc: Oral  SpO2: 96%  Weight: 144 lb (65.3 kg)  Height: 5\' 2"  (1.575 m)    Body mass index is 26.34 kg/m.   Physical Exam Vitals reviewed.  Constitutional:      General: She is  not in acute distress.    Appearance: Normal appearance. She is not ill-appearing, toxic-appearing or diaphoretic.  HENT:     Head: Normocephalic.     Right Ear: Tympanic membrane, ear canal and external ear normal. There is no impacted cerumen.     Left Ear: Tympanic membrane, ear canal and external ear normal. There is no impacted cerumen.     Nose: Nose normal.     Mouth/Throat:     Mouth: Mucous membranes are moist.     Pharynx: Oropharynx is clear. No oropharyngeal exudate or posterior oropharyngeal erythema.  Eyes:     General: No scleral icterus.  Right eye: No discharge.        Left eye: No discharge.     Conjunctiva/sclera: Conjunctivae normal.     Pupils: Pupils are equal, round, and reactive to light.  Neck:     Vascular: No carotid bruit.  Cardiovascular:     Rate and Rhythm: Normal rate and regular rhythm.     Pulses: Normal pulses.     Heart sounds: Normal heart sounds.  Pulmonary:     Effort: Pulmonary effort is normal. No respiratory distress.     Breath sounds: Normal breath sounds.  Abdominal:     General: Abdomen is flat. Bowel sounds are normal.     Palpations: Abdomen is soft.  Musculoskeletal:        General: Normal range of motion.     Cervical back: Normal range of motion.  Skin:    General: Skin is warm and dry.  Neurological:     General: No focal deficit present.     Mental Status: She is alert and oriented to person, place, and time. Mental status is at baseline.  Psychiatric:        Mood and Affect: Mood normal.        Behavior: Behavior normal.        Thought Content: Thought content normal.        Judgment: Judgment normal.     Subsequent Medicare wellness visit   1. Risk factors, based on past  M,S,F - Cardiac Risk Factors include: advanced age (>33men, >50 women);hypertension   2.  Physical activities: Dietary issues and exercise activities discussed:      3.  Depression/mood:  Flowsheet Row Office Visit from 05/14/2023 in [facility] at [facility] Total Score 5        4.  ADL's:    09/10/2023    8:22 AM 09/09/2023    8:08 PM  In your present state of health, do you have any difficulty performing the following activities:  Hearing? 0 1  Vision? 0 0  Difficulty concentrating or making decisions? 0 0  Walking or climbing stairs? 0 0  Dressing or bathing? 0 0  Doing errands, shopping? 0 0  Preparing Food and eating ? N N  Using the Toilet? N N  In the past six months, have you accidently leaked urine? Y Y  Do you have problems with loss of bowel control? N Y  Managing your Medications? N N  Managing your Finances? N N  Housekeeping or managing your Housekeeping? N N     5.  Fall risk:     09/22/2022    1:37 PM 09/24/2022    9:27 AM 05/14/2023    2:56 PM 09/09/2023    8:08 PM 09/10/2023    8:24 AM  Fall Risk  Falls in the past year? 0 0 0 0 0  Was there an injury with Fall? 0 0 0 0 0  Fall Risk Category Calculator 0 0 0 0 0  Fall Risk Category (Retired) Low Low     (RETIRED) Patient Fall Risk Level Low fall risk Low fall risk     Patient at Risk for Falls Due to No Fall Risks No Fall Risks     Fall risk Follow up Falls evaluation completed Falls evaluation completed Falls evaluation completed  Falls evaluation completed     6.  Home safety: No problems identified   7.  Height weight, and visual acuity: height and weight as above, vision/hearing:  Vision Screening   Right eye Left eye Both eyes  Without correction     With correction 20/30 20/30 20/30      8.  Counseling: Counseling given: Not Answered Tobacco comments: Former smoker 03/14/22    9. Lab orders based on risk factors: Laboratory update will be reviewed   10. Cognitive assessment:        09/10/2023    8:25 AM 09/24/2022    9:28 AM  6CIT Screen  What Year? 0 points 0 points  What month? 0 points 0 points  What time? 0 points 0 points  Count back from 20 0 points 0 points  Months in reverse 0  points 0 points  Repeat phrase 4 points 4 points  Total Score 4 points 4 points     11. Screening: Patient provided with a written and personalized 5-10 year screening schedule in the AVS. Health Maintenance  Topic Date Due   COVID-19 Vaccine (6 - 2023-24 season) 07/05/2023   Medicare Annual Wellness Visit  09/09/2024   DTaP/Tdap/Td vaccine (3 - Td or Tdap) 05/27/2033   Pneumonia Vaccine  Completed   Flu Shot  Completed   DEXA scan (bone density measurement)  Completed   Zoster (Shingles) Vaccine  Completed   HPV Vaccine  Aged Out    12. Provider List Update: Patient Care Team    Relationship Specialty Notifications Start End  Philip Aspen, Limmie Patricia, MD PCP - General Internal Medicine  05/14/23   Chrystie Nose, MD PCP - Cardiology Cardiology Admissions 11/10/17   Regan Lemming, MD PCP - Electrophysiology Cardiology  08/01/20      13. Advance Directives:    14. Opioids: Patient is not on any opioid prescriptions and has no risk factors for a substance use disorder.   15.   Goals      Protect My Health     Timeframe:  Long-Range Goal Priority:  Medium Start Date:                             Expected End Date:                       Follow Up Date 09/09/2024    - schedule and keep appointment for annual check-up    Why is this important?   Screening tests can find diseases early when they are easier to treat.  Your doctor or nurse will talk with you about which tests are important for you.  Getting shots for common diseases like the flu and shingles will help prevent them.     Notes:          I have personally reviewed and noted the following in the patient's chart:   Medical and social history Use of alcohol, tobacco or illicit drugs  Current medications and supplements Functional ability and status Nutritional status Physical activity Advanced directives List of other physicians Hospitalizations, surgeries, and ER visits in previous 12  months Vitals Screenings to include cognitive, depression, and falls Referrals and appointments  In addition, I have reviewed and discussed with patient certain preventive protocols, quality metrics, and best practice recommendations. A written personalized care plan for preventive services as well as general preventive health recommendations were provided to patient.  Impression and Plan:  Essential hypertension  Chronic obstructive pulmonary disease, unspecified COPD type (HCC)  Mixed hyperlipidemia  Postmenopausal estrogen deficiency -     DG Bone  Density; Future  Cystocele with prolapse -     Ambulatory referral to Gynecology  Vaginal prolapse -     Ambulatory referral to Gynecology   -Recommend routine eye and dental care. -Healthy lifestyle discussed in detail. -Labs to be updated today. -Prostate cancer screening: N/A Health Maintenance  Topic Date Due   COVID-19 Vaccine (6 - 2023-24 season) 07/05/2023   Medicare Annual Wellness Visit  09/09/2024   DTaP/Tdap/Td vaccine (3 - Td or Tdap) 05/27/2033   Pneumonia Vaccine  Completed   Flu Shot  Completed   DEXA scan (bone density measurement)  Completed   Zoster (Shingles) Vaccine  Completed   HPV Vaccine  Aged Out     -All immunizations are up-to-date with the exception of COVID that she declines despite counseling. -She is interested in updating her DEXA. -She elects to defer all cancer screening due to age. -She is requesting referral to GYN due to what she believes is a vaginal prolapse.  Unclear if there is a cystocele as well.     Chaya Jan, MD [facility]

## 2023-09-11 ENCOUNTER — Other Ambulatory Visit: Payer: Self-pay | Admitting: Student

## 2023-09-15 ENCOUNTER — Ambulatory Visit
Admission: RE | Admit: 2023-09-15 | Discharge: 2023-09-15 | Disposition: A | Discharge status: Home / Self Care | Payer: Medicare Other | Source: Ambulatory Visit | Attending: Radiation Oncology | Admitting: Radiation Oncology

## 2023-09-15 DIAGNOSIS — C3411 Malignant neoplasm of upper lobe, right bronchus or lung: Secondary | ICD-10-CM | POA: Diagnosis not present

## 2023-09-15 DIAGNOSIS — R911 Solitary pulmonary nodule: Secondary | ICD-10-CM

## 2023-09-15 DIAGNOSIS — Z87891 Personal history of nicotine dependence: Secondary | ICD-10-CM | POA: Diagnosis not present

## 2023-09-15 DIAGNOSIS — Z510 Encounter for antineoplastic radiation therapy: Secondary | ICD-10-CM | POA: Diagnosis not present

## 2023-09-16 ENCOUNTER — Encounter (HOSPITAL_COMMUNITY): Payer: Self-pay

## 2023-09-17 ENCOUNTER — Ambulatory Visit: Payer: Medicare Other | Admitting: Nurse Practitioner

## 2023-09-18 ENCOUNTER — Ambulatory Visit (HOSPITAL_COMMUNITY)
Admission: RE | Admit: 2023-09-18 | Discharge: 2023-09-18 | Disposition: A | Discharge status: Home / Self Care | Payer: Medicare Other | Source: Ambulatory Visit | Attending: Internal Medicine | Admitting: Internal Medicine

## 2023-09-18 ENCOUNTER — Other Ambulatory Visit: Payer: Self-pay | Admitting: Cardiovascular Disease

## 2023-09-18 ENCOUNTER — Ambulatory Visit (HOSPITAL_BASED_OUTPATIENT_CLINIC_OR_DEPARTMENT_OTHER)
Admission: RE | Admit: 2023-09-18 | Discharge: 2023-09-18 | Disposition: A | Discharge status: Home / Self Care | Payer: Medicare Other | Source: Ambulatory Visit | Attending: Cardiovascular Disease | Admitting: Cardiovascular Disease

## 2023-09-18 DIAGNOSIS — I2510 Atherosclerotic heart disease of native coronary artery without angina pectoris: Secondary | ICD-10-CM | POA: Diagnosis not present

## 2023-09-18 DIAGNOSIS — R5383 Other fatigue: Secondary | ICD-10-CM | POA: Diagnosis not present

## 2023-09-18 DIAGNOSIS — R931 Abnormal findings on diagnostic imaging of heart and coronary circulation: Secondary | ICD-10-CM | POA: Diagnosis not present

## 2023-09-18 MED ORDER — NITROGLYCERIN 0.4 MG SL SUBL
SUBLINGUAL_TABLET | SUBLINGUAL | Status: AC
  Filled 2023-09-18: qty 2

## 2023-09-18 MED ORDER — IOHEXOL 350 MG/ML SOLN
100.0000 mL | Freq: Once | INTRAVENOUS | Status: AC | PRN
  Administered 2023-09-18: 100 mL via INTRAVENOUS

## 2023-09-18 MED ORDER — NITROGLYCERIN 0.4 MG SL SUBL
0.8000 mg | SUBLINGUAL_TABLET | Freq: Once | SUBLINGUAL | Status: AC
  Administered 2023-09-18: 0.8 mg via SUBLINGUAL

## 2023-09-21 ENCOUNTER — Telehealth: Payer: Self-pay

## 2023-09-21 ENCOUNTER — Encounter: Payer: Self-pay | Admitting: Radiation Oncology

## 2023-09-21 DIAGNOSIS — Z87891 Personal history of nicotine dependence: Secondary | ICD-10-CM | POA: Diagnosis not present

## 2023-09-21 DIAGNOSIS — C3411 Malignant neoplasm of upper lobe, right bronchus or lung: Secondary | ICD-10-CM | POA: Diagnosis not present

## 2023-09-21 DIAGNOSIS — Z510 Encounter for antineoplastic radiation therapy: Secondary | ICD-10-CM | POA: Diagnosis not present

## 2023-09-21 NOTE — Telephone Encounter (Signed)
Patient sent mychart message reporting that she had developed a cold. She reported no fever, sore throat but had a slight cough. She was told to wear her mask for treatment.

## 2023-09-22 ENCOUNTER — Ambulatory Visit: Payer: Medicare Other

## 2023-09-22 ENCOUNTER — Ambulatory Visit
Admission: RE | Admit: 2023-09-22 | Discharge: 2023-09-22 | Disposition: A | Discharge status: Home / Self Care | Payer: Medicare Other | Source: Ambulatory Visit | Attending: Radiation Oncology | Admitting: Radiation Oncology

## 2023-09-22 ENCOUNTER — Other Ambulatory Visit: Payer: Self-pay

## 2023-09-22 DIAGNOSIS — C3411 Malignant neoplasm of upper lobe, right bronchus or lung: Secondary | ICD-10-CM | POA: Diagnosis not present

## 2023-09-22 DIAGNOSIS — Z510 Encounter for antineoplastic radiation therapy: Secondary | ICD-10-CM | POA: Diagnosis not present

## 2023-09-22 DIAGNOSIS — Z87891 Personal history of nicotine dependence: Secondary | ICD-10-CM | POA: Diagnosis not present

## 2023-09-22 DIAGNOSIS — R911 Solitary pulmonary nodule: Secondary | ICD-10-CM

## 2023-09-22 LAB — RAD ONC ARIA SESSION SUMMARY
Course Elapsed Days: 0
Plan Fractions Treated to Date: 1
Plan Prescribed Dose Per Fraction: 18 Gy
Plan Total Fractions Prescribed: 3
Plan Total Prescribed Dose: 54 Gy
Reference Point Dosage Given to Date: 18 Gy
Reference Point Session Dosage Given: 18 Gy
Session Number: 1

## 2023-09-23 ENCOUNTER — Ambulatory Visit: Payer: Medicare Other | Admitting: Radiation Oncology

## 2023-09-23 ENCOUNTER — Ambulatory Visit: Payer: Medicare Other | Admitting: Family Medicine

## 2023-09-23 VITALS — BP 114/70 | HR 69 | Temp 99.9°F | Ht 62.0 in | Wt 143.4 lb

## 2023-09-23 DIAGNOSIS — J069 Acute upper respiratory infection, unspecified: Secondary | ICD-10-CM

## 2023-09-23 DIAGNOSIS — R051 Acute cough: Secondary | ICD-10-CM

## 2023-09-23 DIAGNOSIS — H1033 Unspecified acute conjunctivitis, bilateral: Secondary | ICD-10-CM

## 2023-09-23 DIAGNOSIS — J441 Chronic obstructive pulmonary disease with (acute) exacerbation: Secondary | ICD-10-CM | POA: Diagnosis not present

## 2023-09-23 LAB — POCT INFLUENZA A/B
Influenza A, POC: NEGATIVE
Influenza B, POC: NEGATIVE

## 2023-09-23 LAB — POC COVID19 BINAXNOW: SARS Coronavirus 2 Ag: NEGATIVE

## 2023-09-23 LAB — POCT RAPID STREP A (OFFICE): Rapid Strep A Screen: NEGATIVE

## 2023-09-23 MED ORDER — PREDNISONE 10 MG PO TABS: ORAL_TABLET | ORAL | 0 refills | Status: DC

## 2023-09-23 MED ORDER — POLYMYXIN B-TRIMETHOPRIM 10000-0.1 UNIT/ML-% OP SOLN: 1.0000 [drp] | OPHTHALMIC | 0 refills | Status: DC

## 2023-09-23 MED ORDER — AMOXICILLIN-POT CLAVULANATE 500-125 MG PO TABS: 1.0000 | ORAL_TABLET | Freq: Two times a day (BID) | ORAL | 0 refills | Status: AC

## 2023-09-23 NOTE — Progress Notes (Signed)
Established Patient Office Visit   Subjective  Patient ID: Holly Benson, female    DOB: 1933/09/03  Age: 89 y.o. MRN: [id number]  Chief Complaint  Patient presents with   Cough    Started 4 days ago, cough, congestion, runny nose,     Patient is an 89 year old female with PMH SIG for COPD, pulmonary nodule, PVD, iron deficiency anemia, a flutter, HTN, HLD who is followed by Dr. Ardyth Harps and seen for acute concern.  Patient endorses productive cough, congestion, rhinorrhea x 4 days.  Symptoms causing patient to use albuterol inhaler 2-4 times per day.  Patient denies fever, chills, ear pain/pressure, sore throat, loose stools.  Patient also noted to have crusting of eyelashes.  Patient concerned as she is currently undergoing XRT for history of pulmonary nodule.  States still able to go to XRT tomorrow if afebrile.  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   Iron deficiency anemia due to chronic blood loss 07/30/2020   RLS (restless legs syndrome) 07/30/2020   Typical atrial flutter (HCC) 01/12/2018   Cardiomyopathy (HCC) 11/10/2017   Shortness of breath 11/10/2017   Heart palpitations 09/10/2017   PAF (paroxysmal atrial fibrillation) (HCC) 09/10/2017   Essential hypertension 09/10/2017   Mixed hyperlipidemia 09/10/2017   COPD (chronic obstructive pulmonary disease) (HCC) 07/21/2017   Cough 07/21/2017   Past Medical History:  Diagnosis Date   Arrhythmia    Arthritis    Atrial fibrillation (HCC)    Constipation     chronic   COPD (chronic obstructive pulmonary disease) (HCC)    Hyperlipidemia    Hypertension    pulmonary   OSA on CPAP    Osteoarthritis    Osteoporosis    Peripheral neuropathy    Pulmonary hypertension (HCC)    RLS (restless legs syndrome)    Sleep apnea    Past Surgical History:  Procedure Laterality Date   ABDOMINAL AORTOGRAM W/LOWER EXTREMITY N/A 03/21/2020   Procedure: ABDOMINAL AORTOGRAM W/ Bilateral LOWER EXTREMITY Runoff;  Surgeon: Iran Ouch, MD;  [facility];  Service: Cardiovascular;  Laterality: N/A;   ABDOMINAL HYSTERECTOMY Bilateral    arm surgery Right    Broken arm and has a plate in it   CARDIOVERSION N/A 03/12/2023   Procedure: CARDIOVERSION;  Surgeon: Quintella Reichert, MD;  [facility]: [facility];  Service: Cardiovascular;  Laterality: N/A;   CATARACT EXTRACTION Bilateral    COLONOSCOPY     More than 10 years ago In [facility]   KNEE ARTHROSCOPY Right    PERIPHERAL VASCULAR INTERVENTION Bilateral 03/21/2020   Procedure: PERIPHERAL VASCULAR INTERVENTION;  Surgeon: Iran Ouch, MD;  [facility]: [facility];  Service: Cardiovascular;  Laterality: Bilateral;  external iliac   TONSILLECTOMY     Social History   Tobacco Use   Smoking status: Former    Current packs/day: 0.00    Average packs/day: 1.5 packs/day for 37.0 years (55.5 ttl pk-yrs)    Types: Cigarettes    Start date: 109    Quit date: 11/04/1991  Years since quitting: 31.9   Smokeless tobacco: Never   Tobacco comments:    Former smoker 03/14/22  Vaping Use   Vaping status: Never Used  Substance Use Topics   Alcohol use: Yes    Alcohol/week: 7.0 standard drinks of alcohol    Types: 7 Glasses of wine per week    Comment: 1 glass of wine nightly 03/14/22   Drug use: No   Family History  Problem Relation Age of Onset   Suicidality Father    Stroke Maternal Grandfather    Hypertension Sister    Colon cancer Neg Hx    Esophageal cancer Neg Hx     Pancreatic cancer Neg Hx    Stomach cancer Neg Hx    Liver disease Neg Hx    Allergies  Allergen Reactions   Lovenox [Enoxaparin Sodium] Hives and Rash    PT STATES SHE BROKE OUT IN A RASH HEAD TO TOE AND LASTED ABOUT 3 WEEKS    Metoprolol Succinate [Metoprolol] Other (See Comments)    Muscle aches, hand pain, and tingling      Review of Systems  Respiratory:  Positive for cough.    Negative unless stated above    Objective:     BP 114/70 (BP Location: Left Arm, Patient Position: Sitting, Cuff Size: Normal)   Pulse 69   Temp 99.9 F (37.7 C) (Oral)   Ht 5\' 2"  (1.575 m)   Wt 143 lb 6.4 oz (65 kg)   SpO2 93%   BMI 26.23 kg/m  BP Readings from Last 3 Encounters:  09/23/23 114/70  09/18/23 (!) 154/64  09/10/23 (!) 128/51   Wt Readings from Last 3 Encounters:  09/23/23 143 lb 6.4 oz (65 kg)  09/10/23 144 lb 3.2 oz (65.4 kg)  09/10/23 144 lb (65.3 kg)   SpO2 Readings from Last 3 Encounters:  09/23/23 93%  09/10/23 96%  09/10/23 96%      Physical Exam Constitutional:      General: She is not in acute distress.    Appearance: Normal appearance.  HENT:     Head: Normocephalic and atraumatic.     Right Ear: Tympanic membrane normal. Decreased hearing noted. No middle ear effusion. There is no impacted cerumen. Tympanic membrane is not erythematous.     Left Ear: Tympanic membrane normal. Decreased hearing noted.  No middle ear effusion. There is no impacted cerumen. Tympanic membrane is not erythematous.     Ears:     Comments: Hearing aids not in place.    Nose: Mucosal edema and rhinorrhea present.     Right Turbinates: Swollen.     Left Turbinates: Swollen.     Right Sinus: No maxillary sinus tenderness or frontal sinus tenderness.     Left Sinus: No maxillary sinus tenderness or frontal sinus tenderness.     Mouth/Throat:     Mouth: Mucous membranes are moist.     Pharynx: Postnasal drip present.  Eyes:     General:        Right eye: Discharge present.         Left eye: Discharge present.    Conjunctiva/sclera:     Right eye: Right conjunctiva is injected.     Left eye: Left conjunctiva is injected.     Comments: Dried crusting of upper and lower eyelashes bilaterally.  Cardiovascular:     Rate and Rhythm: Normal rate and regular rhythm.     Heart sounds: Normal heart sounds. No murmur heard.  No gallop.  Pulmonary:     Effort: Pulmonary effort is normal. No respiratory distress.     Breath sounds: Normal breath sounds. No wheezing, rhonchi or rales.  Skin:    General: Skin is warm and dry.  Neurological:     Mental Status: She is alert and oriented to person, place, and time.      Results for orders placed or performed in visit on 09/23/23  POC COVID-19 BinaxNow  Result Value Ref Range   SARS Coronavirus 2 Ag Negative Negative  POC Influenza A/B  Result Value Ref Range   Influenza A, POC Negative Negative   Influenza B, POC Negative Negative  POC Rapid Strep A  Result Value Ref Range   Rapid Strep A Screen Negative Negative      Assessment & Plan:  COPD exacerbation (HCC) -     Amoxicillin-Pot Clavulanate; Take 1 tablet by mouth in the morning and at bedtime for 7 days.  Dispense: 14 tablet; Refill: 0 -     predniSONE; Take 4 tabs every morning for 3 days, 3 tabs for 2 days, 2 tabs for 2 days, 1 tab for 1 day.  Dispense: 23 tablet; Refill: 0  Acute cough -     POC COVID-19 BinaxNow -     POCT Influenza A/B -     POCT rapid strep A  Acute bacterial conjunctivitis of both eyes -     Polymyxin B-Trimethoprim; Place 1 drop into both eyes every 4 (four) hours.  Dispense: 10 mL; Refill: 0  Viral URI  Patient with acute cough and congestion.  POC COVID, flu, strep testing negative.  Start ABX and prednisone for COPD exacerbation likely triggered by viral URI.  Continue albuterol inhaler as needed.  Start antibiotic eyedrops for bacterial conjunctivitis.  Given strict ED precautions for continued or worsening  symptoms.  Return if symptoms worsen or fail to improve.   Deeann Saint, MD

## 2023-09-24 ENCOUNTER — Ambulatory Visit (HOSPITAL_COMMUNITY): Payer: Medicare Other | Admitting: Physician Assistant

## 2023-09-24 ENCOUNTER — Other Ambulatory Visit: Payer: Self-pay

## 2023-09-24 ENCOUNTER — Ambulatory Visit
Admission: RE | Admit: 2023-09-24 | Discharge: 2023-09-24 | Disposition: A | Discharge status: Home / Self Care | Payer: Medicare Other | Source: Ambulatory Visit | Attending: Radiation Oncology | Admitting: Radiation Oncology

## 2023-09-24 DIAGNOSIS — C3411 Malignant neoplasm of upper lobe, right bronchus or lung: Secondary | ICD-10-CM | POA: Diagnosis not present

## 2023-09-24 DIAGNOSIS — Z87891 Personal history of nicotine dependence: Secondary | ICD-10-CM | POA: Diagnosis not present

## 2023-09-24 DIAGNOSIS — R911 Solitary pulmonary nodule: Secondary | ICD-10-CM

## 2023-09-24 DIAGNOSIS — Z510 Encounter for antineoplastic radiation therapy: Secondary | ICD-10-CM | POA: Diagnosis not present

## 2023-09-24 LAB — RAD ONC ARIA SESSION SUMMARY
Course Elapsed Days: 2
Plan Fractions Treated to Date: 2
Plan Prescribed Dose Per Fraction: 18 Gy
Plan Total Fractions Prescribed: 3
Plan Total Prescribed Dose: 54 Gy
Reference Point Dosage Given to Date: 36 Gy
Reference Point Session Dosage Given: 18 Gy
Session Number: 2

## 2023-09-25 ENCOUNTER — Other Ambulatory Visit (HOSPITAL_COMMUNITY): Payer: Self-pay | Admitting: *Deleted

## 2023-09-25 DIAGNOSIS — E785 Hyperlipidemia, unspecified: Secondary | ICD-10-CM

## 2023-09-25 MED ORDER — ROSUVASTATIN CALCIUM 20 MG PO TABS: 20.0000 mg | ORAL_TABLET | Freq: Every day | ORAL | 3 refills | Status: DC

## 2023-09-26 ENCOUNTER — Encounter: Payer: Self-pay | Admitting: Cardiology

## 2023-09-28 ENCOUNTER — Encounter: Payer: Medicare Other | Admitting: Family

## 2023-09-29 ENCOUNTER — Ambulatory Visit
Admission: RE | Admit: 2023-09-29 | Discharge: 2023-09-29 | Disposition: A | Discharge status: Home / Self Care | Payer: Medicare Other | Source: Ambulatory Visit | Attending: Radiation Oncology | Admitting: Radiation Oncology

## 2023-09-29 ENCOUNTER — Other Ambulatory Visit: Payer: Self-pay

## 2023-09-29 DIAGNOSIS — Z87891 Personal history of nicotine dependence: Secondary | ICD-10-CM | POA: Diagnosis not present

## 2023-09-29 DIAGNOSIS — Z510 Encounter for antineoplastic radiation therapy: Secondary | ICD-10-CM | POA: Diagnosis not present

## 2023-09-29 DIAGNOSIS — C3411 Malignant neoplasm of upper lobe, right bronchus or lung: Secondary | ICD-10-CM | POA: Diagnosis not present

## 2023-09-29 DIAGNOSIS — R911 Solitary pulmonary nodule: Secondary | ICD-10-CM

## 2023-09-29 LAB — RAD ONC ARIA SESSION SUMMARY
Course Elapsed Days: 7
Plan Fractions Treated to Date: 3
Plan Prescribed Dose Per Fraction: 18 Gy
Plan Total Fractions Prescribed: 3
Plan Total Prescribed Dose: 54 Gy
Reference Point Dosage Given to Date: 54 Gy
Reference Point Session Dosage Given: 18 Gy
Session Number: 3

## 2023-09-30 NOTE — Radiation Completion Notes (Signed)
Patient Name: Holly Benson, Holly Benson MRN: [id number] Date of Birth: 20-Oct-1933 Referring Physician: Levy Pupa, M.D. Date of Service: 2023-09-30 Radiation Oncologist: Arnette Schaumann, M.D. [facility] [place]                             RADIATION ONCOLOGY END OF TREATMENT NOTE     Diagnosis: R91.1 Solitary pulmonary nodule Intent: Curative     ==========DELIVERED PLANS==========  First Treatment Date: 2023-09-22 - Last Treatment Date: 2023-09-29   Plan Name: Lung_R_SBRT Site: Lung, Right Technique: SBRT/SRT-IMRT Mode: Photon Dose Per Fraction: 18 Gy Prescribed Dose (Delivered / Prescribed): 54 Gy / 54 Gy Prescribed Fxs (Delivered / Prescribed): 3 / 3     ==========ON TREATMENT VISIT DATES========== 2023-09-22, 2023-09-24, 2023-09-29, 2023-09-29     ==========UPCOMING VISITS========== 2023-10-06 No Location Listed Wait List No Provider Listed        ==========APPENDIX - ON TREATMENT VISIT NOTES==========   See weekly On Treatment Notes in Epic for details.

## 2023-10-05 ENCOUNTER — Telehealth (HOSPITAL_BASED_OUTPATIENT_CLINIC_OR_DEPARTMENT_OTHER): Payer: Self-pay | Admitting: Internal Medicine

## 2023-10-05 NOTE — Progress Notes (Signed)
Cardiology Clinic Note   Patient Name: [name] Date of Encounter: 10/12/2023  Primary Care Provider:  Philip Aspen, Limmie Patricia, MD Primary Cardiologist:  Chrystie Nose, MD  Patient Profile    Holly Benson 89 year old female presents the clinic today for follow-up evaluation of her essential hypertension and atrial fibrillation.  Past Medical History    Past Medical History:  Diagnosis Date   Arrhythmia    Arthritis    Atrial fibrillation (HCC)    Constipation    chronic   COPD (chronic obstructive pulmonary disease) (HCC)    Hyperlipidemia    Hypertension    pulmonary   OSA on CPAP    Osteoarthritis    Osteoporosis    Peripheral neuropathy    Pulmonary hypertension (HCC)    RLS (restless legs syndrome)    Sleep apnea    Past Surgical History:  Procedure Laterality Date   ABDOMINAL AORTOGRAM W/LOWER EXTREMITY N/A 03/21/2020   Procedure: ABDOMINAL AORTOGRAM W/ Bilateral LOWER EXTREMITY [place];  Surgeon: Iran Ouch, MD;  Location: [facility];  Service: Cardiovascular;  Laterality: N/A;   ABDOMINAL HYSTERECTOMY Bilateral    arm surgery Right    Broken arm and has a plate in it   CARDIOVERSION N/A 03/12/2023   Procedure: CARDIOVERSION;  Surgeon: Quintella Reichert, MD;  Location: [facility];  Service: Cardiovascular;  Laterality: N/A;   CATARACT EXTRACTION Bilateral    COLONOSCOPY     More than 10 years ago In [facility]   KNEE ARTHROSCOPY Right    PERIPHERAL VASCULAR INTERVENTION Bilateral 03/21/2020   Procedure: PERIPHERAL VASCULAR INTERVENTION;  Surgeon: Iran Ouch, MD;  Location: [facility];  Service: Cardiovascular;  Laterality: Bilateral;  external iliac   TONSILLECTOMY      Allergies  Allergies  Allergen Reactions   Lovenox [Enoxaparin Sodium] Hives and Rash    PT STATES SHE BROKE OUT IN A RASH HEAD TO TOE AND LASTED ABOUT 3 WEEKS    Metoprolol Succinate [Metoprolol] Other (See Comments)    Muscle  aches, hand pain, and tingling    History of Present Illness      Holly Benson has a PMH of hypertension, atrial flutter, hyperlipidemia, obstructive sleep apnea, COPD, peripheral arterial disease, and atrial fibrillation.  CHA2DS2-VASc score 5 on Eliquis.   She was  seen by Alphonzo Severance, PA-C on 05/17/2021.  Of note she was previously on amiodarone which was discontinued due to lung toxicity.  When she was seen in the atrial fibrillation clinic she described a frequent fluttering type sensation which would last for 10 to 15 minutes and happen multiple times per day.  She was started on diltiazem 30 mg daily.   She contacted the nurse triage line 07/11/2021.  Her metoprolol was transitioned to carvedilol.  With metoprolol she did notice tingling and numbness with an icy type pain in her thumb and first 2 fingers on each hand.   She presented to the clinic 07/15/2021 for follow-up evaluation stated she was out of town for several days and noticed that her blood pressure was low.  She reported that she stopped taking her afternoon dose of hydralazine and did not notice much change with her blood pressure.  She discontinued her morning dose of hydralazine as well.  She noticed a pain in her hands and tingling with metoprolol.  She also noticed some occasional ankle edema.  I changed her metoprolol to carvedilol, gave her the [place] support  stocking sheet, discontinued her hydralazine, gave her the salty 6 diet sheet, asked her to maintain her physical activity, and planned follow-up in 1 to 2 months.  Have asked her to maintain a blood pressure log.   She reported that she contacted the office earlier last week for follow-up appointment.  She was told that she would not be scheduled for an appointment with Dr. Rennis Golden for several months.  She contacted the patient advocate representatives with her complaint.  She was added onto my schedule today.   She followed up with [name] PA-C on 08/06/2021.  She  had contacted the office with reports of increased palpitations.  She questioned if she was having more episodes of atrial fibrillation.  She presented to the clinic alone.  She reported that she did not want to distinguish between PACs and paroxysmal atrial fibrillation.  She noted palpitations at least once daily that would last for around 30 minutes.  She denied dizziness, presyncope, syncope with the episodes.  She had not taken her as needed Cardizem.  She was maintaining a blood pressure log.  Her systolic blood pressures were in the 80- 100s.  She denied bleeding issues.  She was tolerating carvedilol well.   She presented to the clinic 10/08/2021 for follow-up evaluation stated over the last week she had noticed increased shortness of breath and had has been taking her diltiazem more regularly due to increased heart rate.  Her blood pressures had been slightly elevated.  In  clinic her heart rate was 88 with a blood pressure of 116/60.  She reported that she  had some increased phlegm and has just returned from seeing her family.  She had a call from her pharmacist who lowered her apixaban dosing due to her weight.  She had regained weight.  I  increased her apixaban dosing back to 5 mg twice daily.  I will ordered an echocardiogram, CBC, and scheduled follow-up for February.   She was seen by Otilio Saber, PA-C 10/10/2021.  During that time she reported that she was doing okay overall.  She noted shortness of breath and fatigue x10 days.  It was felt that it was likely related to her atrial fibrillation.  Her heart rates at home were 80-1 100s.  Her weight was slightly increased and she noted more ankle swelling.  She was prescribed furosemide 20 mg as needed for lower extremity swelling.   She was seen by Dr. Okey Regal 10/22/2021.  At that time she was interested in starting Tikosyn in February because she was planning a trip to [place] for the month of January.  Her weight remains stable and her heart rate  was rate controlled.   She presented to the clinic 12/12/21 for follow-up evaluation and states she had a nice trip to [facility].  She reported that someone told her to stop taking her diltiazem.  She had not taken the medication in around 1 and half months.  She reported that her heart rate had been 90s and low 100s.  She felt that her shortness of breath was stable.  We reviewed the medication and recommendations for starting Tikosyn.  I have asked her to follow-up with the atrial fibrillation clinic, I prescribed 60 mg of extended release diltiazem, and plan follow-up for 3 to 4 months.  She was noted to have changes on CT scan noted in her lungs.  Her amiodarone was stopped.  She underwent load of dofetilide.  She followed up with Dr. Elberta Fortis 12/01/2022.  She denied chest pain and shortness of breath.  She denied dizziness presyncope and syncope.  She was tolerating medications well.  She denied further episodes of atrial fibrillation.  She underwent repair of ventral hernia and reported that her appetite was getting better.  Chest Vascor was noted to be 4.  She was continued on apixaban, metoprolol and follow-up was planned for 6 months.  She presented to the clinic 01/01/2023 for follow-up evaluation and stated she had been noticing weakness, muscle aches, and shortness of breath over the last 6 weeks.  Her blood pressures had been elevated in the morning and stabilized throughout the day.  We reviewed her previous echocardiogram and last clinic visit.  She expressed understanding.  She felt that she had recovered fairly well since having her hernia repair.  However, she had not been able to return to her baseline physical activity since her surgery.  She had been eating better and her weight was slightly increased.  I  ordered a CBC and echocardiogram.  Will plan follow-up in 1 month - 2 months.  She planned to travel to [facility] to see a glass blowing exhibit in the near future.  Echocardiogram  02/03/2023 showed normal EF and intermediate diastolic parameters.  CBC was stable.  She presented to the clinic 02/17/2023 for follow-up evaluation and stated her blood pressure had been better controlled.  She still had some weakness and fatigue.  She felt this may be related to her beta-blocker.  We discussed her previous medications and atrial fibrillation.  I explained that  EP would be able to offer recommendations during her appointment in 2 days.  She expressed understanding.  She enjoyed her trip to [place].  She had been somewhat physically active and continued to do yoga.  I refilled her amlodipine and planned follow-up in 6 months.   She was seen in follow-up by Otilio Saber 02/19/2023.  Her atenolol was discontinued and she was started on bisoprolol.  DCCV was also discussed.  She was seen for follow-up EKG on 03/05/2023.  Her symptoms continued.  Her EKG showed atrial flutter 94 bpm.  She was scheduled for DCCV on 03/12/2023.  On presentation for DCCV she was in sinus rhythm.  Her cardioversion was canceled.    She presented to the clinic 04/13/23 for follow-up evaluation and stated she continued to have fatigue, shortness of breath, and decreased endurance.  She was able to climb the stairs to her upstairs apartment and do all of her normal daily activities.  She reported that over the last 6 months she was not able to do elective walking and needed more frequent breaks.  Her EKG showed normal sinus rhythm incomplete right bundle branch block 60 bpm.  We again reviewed her most recent echocardiogram and her visit with EP.  She was tolerating bisoprolol well.  She was noted to have elevated BNP at 891.4 on 03/24/2023.  She did not have much increased urination with increased furosemide.  I increased her furosemide to 40 mg 4 days/week and have her continue 20 mg 3 days/week.  I  ordered BMP in 2 weeks, gave [place] support stocking sheet, had her continue low-sodium diet, and recommended that she see  pulmonology in follow-up for pulmonary function test.  I planned cardiology follow-up in 3 months.  She was seen in follow-up by Dr. Rennis Golden on 09/03/2023.  During that time she reported some worsening fatigue with exertion as well as dyspnea.  She noted that she had been told she  had a spot on her lung that was felt to be lung cancer.  She had follow-up planned with Dr. Roselind Messier.  Pulmonology did not feel that her lungs were contributing to her fatigue.  She denied intermittent atrial fibrillation.  Her EKG at that time did not show atrial fibrillation.  It was felt that her coronary disease may be contributing to her progressive dyspnea on exertion.  Coronary CTA was ordered.  FFR analysis showed no obstructive CAD.  She presents to the clinic today for follow-up evaluation and states she has had no change in her breathing.  She reports that she had an upper respiratory infection and radiation treatments.  These were hard on her.  She feels that she is recovering.  She continues to note difficulties with her breathing that she feels is related to her COPD.  She has been monitoring her watch for atrial fibrillation.  She reports that during her recent illness she did have a few episodes of brief periods of atrial fibrillation that were less than a minute and resolved without intervention.  We reviewed her coronary CT and FFR.  She expressed understanding.  She is tolerating her rosuvastatin well.  I will plan for repeat fasting lipids and LFTs in March and follow-up in 6 months.    Today she denies chest pain,  melena, hematuria, hemoptysis, diaphoresis, weakness, presyncope, syncope, orthopnea, and PND.     Home Medications    Prior to Admission medications   Medication Sig Start Date End Date Taking? Authorizing Provider  acetaminophen (TYLENOL) 325 MG tablet Take 650 mg by mouth as needed for moderate pain or headache.    [provider]  albuterol (VENTOLIN HFA) 108 (90 Base) MCG/ACT  inhaler Inhale 2 puffs into the lungs every 6 (six) hours as needed for wheezing or shortness of breath. 03/10/22   Parrett, Virgel Bouquet, NP  amLODipine (NORVASC) 5 MG tablet Take 1 tablet (5 mg total) by mouth daily. 12/01/22   Camnitz, Andree Coss, MD  apixaban (ELIQUIS) 5 MG TABS tablet Take 1 tablet (5 mg total) by mouth 2 (two) times daily. 12/10/22   Camnitz, Andree Coss, MD  atenolol (TENORMIN) 50 MG tablet Take 1 tablet (50 mg total) by mouth daily. 12/02/22   Hilty, Lisette Abu, MD  clonazePAM (KLONOPIN) 0.5 MG tablet Take 0.25 mg by mouth at bedtime as needed for anxiety.    [provider]  docusate sodium (COLACE) 100 MG capsule Take 300 mg by mouth at bedtime.    [provider]  dofetilide (TIKOSYN) 250 MCG capsule Take 1 capsule (250 mcg total) by mouth 2 (two) times daily. 08/14/22   Sheilah Pigeon, PA-C  furosemide (LASIX) 20 MG tablet Take one tablet by mouth on Monday, Wednesday and Friday 02/28/22   Fenton, Clint R, PA  irbesartan (AVAPRO) 300 MG tablet Take 1 tablet (300 mg total) by mouth daily. 11/12/22   Hilty, Lisette Abu, MD  Misc Natural Products (COLON CLEANSE) CAPS Take 4 capsules by mouth as needed (constipation).    [provider]  OVER THE COUNTER MEDICATION Take 3 capsules by mouth daily. [place], Bone Strength    [provider]  Polyethyl Glycol-Propyl Glycol (SYSTANE) 0.4-0.3 % SOLN Place 1 drop into both eyes as needed.    [provider]  polyethylene glycol (MIRALAX / GLYCOLAX) 17 g packet Take 17 g by mouth 2 (two) times daily.    [provider]  PRESCRIPTION MEDICATION at bedtime. CPAP  [provider]  rOPINIRole (REQUIP) 0.25 MG tablet Take 1 tablet in the morning and three tablets after dinner po 02/13/22   Dohmeier, Porfirio Mylar, MD  Tiotropium Bromide-Olodaterol (STIOLTO RESPIMAT) 2.5-2.5 MCG/ACT AERS USE 2 INHALATIONS ORALLY   EVERY MORNING 05/30/22   Byrum, Les Pou, MD  Vitamin D-Vitamin K (VITAMIN  K2-VITAMIN D3 PO) Take 1 Capful by mouth daily.    [provider]    Family History    Family History  Problem Relation Age of Onset   Suicidality Father    Stroke Maternal Grandfather    Hypertension Sister    Colon cancer Neg Hx    Esophageal cancer Neg Hx    Pancreatic cancer Neg Hx    Stomach cancer Neg Hx    Liver disease Neg Hx    She indicated that her mother is deceased. She indicated that her father is deceased. She indicated that her sister is alive. She indicated that her maternal grandfather is deceased. She indicated that the status of her neg hx is unknown.  Social History    Social History   Socioeconomic History   Marital status: Widowed    Spouse name: Not on file   Number of children: 3   Years of education: Not on file   Highest education level: Master's degree (e.g., MA, MS, MEng, MEd, MSW, MBA)  Occupational History   Occupation: retired    Comment: retired Comptroller  Tobacco Use   Smoking status: Former    Current packs/day: 0.00    Average packs/day: 1.5 packs/day for 37.0 years (55.5 ttl pk-yrs)    Types: Cigarettes    Start date: 17    Quit date: 11/04/1991    Years since quitting: 31.9   Smokeless tobacco: Never   Tobacco comments:    Former smoker 03/14/22  Vaping Use   Vaping status: Never Used  Substance and Sexual Activity   Alcohol use: Yes    Alcohol/week: 7.0 standard drinks of alcohol    Types: 7 Glasses of wine per week    Comment: 1 glass of wine nightly 03/14/22   Drug use: No   Sexual activity: Not Currently  Other Topics Concern   Not on file  Social History Narrative   Lives with daughter   Social Determinants of Health   Financial Resource Strain: Low Risk  (09/23/2023)   Overall Financial Resource Strain (CARDIA)    Difficulty of Paying Living Expenses: Not hard at all  Food Insecurity: No Food Insecurity (09/23/2023)   Hunger Vital Sign    Worried About Running Out of Food in the Last Year: Never true     Ran Out of Food in the Last Year: Never true  Transportation Needs: No Transportation Needs (09/23/2023)   PRAPARE - Administrator, Civil Service (Medical): No    Lack of Transportation (Non-Medical): No  Physical Activity: Insufficiently Active (09/23/2023)   Exercise Vital Sign    Days of Exercise per Week: 3 days    Minutes of Exercise per Session: 30 min  Stress: No Stress Concern Present (09/23/2023)   [employer] of Occupational Health - Occupational Stress Questionnaire    Feeling of Stress : Not at all  Social Connections: Moderately Integrated (09/23/2023)   Social Connection and Isolation Panel [NHANES]    Frequency of Communication with Friends and Family: More than three times a week    Frequency of Social Gatherings with Friends and Family: More than three times a week  Attends Religious Services: More than 4 times per year    Active Member of Clubs or Organizations: Yes    Attends Banker Meetings: More than 4 times per year    Marital Status: Widowed  Intimate Partner Violence: Not At Risk (09/10/2023)   Humiliation, Afraid, Rape, and Kick questionnaire    Fear of Current or Ex-Partner: No    Emotionally Abused: No    Physically Abused: No    Sexually Abused: No     Review of Systems    General:  No chills, fever, night sweats or weight changes.  Cardiovascular:  No chest pain, dyspnea on exertion, edema, orthopnea, palpitations, paroxysmal nocturnal dyspnea. Dermatological: No rash, lesions/masses Respiratory: No cough, dyspnea Urologic: No hematuria, dysuria Abdominal:   No nausea, vomiting, diarrhea, bright red blood per rectum, melena, or hematemesis Neurologic:  No visual changes, wkns, changes in mental status. All other systems reviewed and are otherwise negative except as noted above.  Physical Exam    VS:  BP (!) 122/58 (BP Location: Left Arm, Patient Position: Sitting, Cuff Size: Normal)   Pulse 73   Ht 5\' 2"   (1.575 m)   Wt 144 lb (65.3 kg)   SpO2 90%   BMI 26.34 kg/m  , BMI Body mass index is 26.34 kg/m. GEN: Well nourished, well developed, in no acute distress. HEENT: normal. Neck: Supple, no JVD, carotid bruits, or masses. Cardiac: RRR, no murmurs, rubs, or gallops. No clubbing, cyanosis, generalized left ankle greater than right  edema.  Radials/DP/PT 2+ and equal bilaterally.  Respiratory:  Respirations regular and unlabored, clear to auscultation bilaterally.   GI: Soft, nontender, nondistended, BS + x 4. MS: no deformity or atrophy.   Skin: warm and dry, no rash. Neuro:  Strength and sensation are intact. Psych: Normal affect.  Accessory Clinical Findings    Recent Labs: 03/24/2023: B Natriuretic Peptide 891.4 10/07/2023: BUN 21; Creatinine, Ser 1.05; Hemoglobin 13.6; Magnesium 2.4; Platelets 282; Potassium 4.4; Sodium 139   Recent Lipid Panel    Component Value Date/Time   CHOL 230 (H) 09/03/2023 1151   TRIG 129 09/03/2023 1151   HDL 72 09/03/2023 1151   CHOLHDL 3.2 09/03/2023 1151   CHOLHDL 2.7 05/26/2022 1046   LDLCALC 135 (H) 09/03/2023 1151   LDLCALC 125 (H) 05/26/2022 1046         ECG personally reviewed by me today-none today.  Nuclear stress testing 04/04/2022    The study is normal. Findings are consistent with no prior ischemia and no prior myocardial infarction. The study is low risk.   No ST deviation was noted.   LV perfusion is normal. There is no evidence of ischemia. There is no evidence of infarction.   Left ventricular function is normal. Nuclear stress EF: 64 %. The left ventricular ejection fraction is normal (55-65%). End diastolic cavity size is normal. End systolic cavity size is normal.   Prior study available for comparison from 12/08/2018.  Echocardiogram 04/17/2022  IMPRESSIONS     1. Global longitudinal strain is -17.1% Improved from echo in Dec 2022.  Left ventricular ejection fraction, by estimation, is 65 to 70%. The left  ventricle has  normal function. The left ventricle has no regional wall  motion abnormalities. Left  ventricular diastolic parameters were normal.   2. Right ventricular systolic function is mildly reduced. The right  ventricular size is normal. There is moderately elevated pulmonary artery  systolic pressure.   3. Right atrial size was severely dilated.  4. The mitral valve is normal in structure. Mild mitral valve  regurgitation.   5. Tricuspid valve regurgitation is mild to moderate.   6. The aortic valve is tricuspid. Aortic valve regurgitation is mild.  Aortic valve sclerosis is present, with no evidence of aortic valve  stenosis.   FINDINGS   Left Ventricle: Global longitudinal strain is -17.1% Improved from echo  in Dec 2022. Left ventricular ejection fraction, by estimation, is 65 to  70%. The left ventricle has normal function. The left ventricle has no  regional wall motion abnormalities.  The left ventricular internal cavity size was normal in size. There is no  left ventricular hypertrophy. Left ventricular diastolic parameters were  normal.   Right Ventricle: The right ventricular size is normal. Right vetricular  wall thickness was not assessed. Right ventricular systolic function is  mildly reduced. There is moderately elevated pulmonary artery systolic  pressure. The tricuspid regurgitant  velocity is 3.14 m/s, and with an assumed right atrial pressure of 8 mmHg,  the estimated right ventricular systolic pressure is 47.4 mmHg.   Left Atrium: Left atrial size was normal in size.   Right Atrium: Right atrial size was severely dilated.   Pericardium: There is no evidence of pericardial effusion.   Mitral Valve: The mitral valve is normal in structure. Mild mitral valve  regurgitation.   Tricuspid Valve: The tricuspid valve is normal in structure. Tricuspid  valve regurgitation is mild to moderate.   Aortic Valve: The aortic valve is tricuspid. Aortic valve regurgitation is   mild. Aortic regurgitation PHT measures 612 msec. Aortic valve sclerosis  is present, with no evidence of aortic valve stenosis.   Pulmonic Valve: The pulmonic valve was normal in structure. Pulmonic valve  regurgitation is not visualized.   Aorta: The aortic root and ascending aorta are structurally normal, with  no evidence of dilitation.   IAS/Shunts: No atrial level shunt detected by color flow Doppler.   Echocardiogram 02/03/2023  IMPRESSIONS     1. Left ventricular ejection fraction, by estimation, is 60 to 65%. The  left ventricle has normal function. The left ventricle has no regional  wall motion abnormalities. Left ventricular diastolic parameters are  indeterminate. The average left  ventricular global longitudinal strain is -17.9 %. The global longitudinal  strain is normal.   2. Right ventricular systolic function is normal. The right ventricular  size is normal. There is mildly elevated pulmonary artery systolic  pressure. The estimated right ventricular systolic pressure is 40.9 mmHg.   3. The mitral valve is degenerative. Mild mitral valve regurgitation. No  evidence of mitral stenosis. Moderate mitral annular calcification.   4. Tricuspid valve regurgitation is moderate.   5. The aortic valve has an indeterminant number of cusps. Aortic valve  regurgitation is mild to moderate. Aortic valve sclerosis/calcification is  present, without any evidence of aortic stenosis. Aortic regurgitation PHT  measures 341 msec. By PHT the AI   is moderate but visually appears mild.   6. The inferior vena cava is normal in size with greater than 50%  respiratory variability, suggesting right atrial pressure of 3 mmHg.   7. Right atrial size was severely dilated.   8. Left atrial size was mildly dilated.   FINDINGS   Left Ventricle: Left ventricular ejection fraction, by estimation, is 60  to 65%. The left ventricle has normal function. The left ventricle has no  regional wall  motion abnormalities. The average left ventricular global  longitudinal strain is -17.9 %.  The global longitudinal strain is normal. The left ventricular internal  cavity size was normal in size. There is no left ventricular hypertrophy.  Left ventricular diastolic parameters are indeterminate. Normal left  ventricular filling pressure.   Right Ventricle: The right ventricular size is normal. No increase in  right ventricular wall thickness. Right ventricular systolic function is  normal. There is mildly elevated pulmonary artery systolic pressure. The  tricuspid regurgitant velocity is 3.08   m/s, and with an assumed right atrial pressure of 3 mmHg, the estimated  right ventricular systolic pressure is 40.9 mmHg.   Left Atrium: Left atrial size was mildly dilated.   Right Atrium: Right atrial size was severely dilated.   Pericardium: There is no evidence of pericardial effusion.   Mitral Valve: The mitral valve is degenerative in appearance. There is  mild thickening of the mitral valve leaflet(s). Moderate mitral annular  calcification. Mild mitral valve regurgitation. No evidence of mitral  valve stenosis.   Tricuspid Valve: The tricuspid valve is normal in structure. Tricuspid  valve regurgitation is moderate . No evidence of tricuspid stenosis.   Aortic Valve: By PHT the AI is moderate but visually appears mild. The  aortic valve has an indeterminant number of cusps. Aortic valve  regurgitation is mild to moderate. Aortic regurgitation PHT measures 341  msec. Aortic valve sclerosis/calcification is  present, without any evidence of aortic stenosis.   Pulmonic Valve: The pulmonic valve was normal in structure. Pulmonic valve  regurgitation is trivial. No evidence of pulmonic stenosis.   Aorta: The aortic root is normal in size and structure.   Venous: The inferior vena cava is normal in size with greater than 50%  respiratory variability, suggesting right atrial pressure  of 3 mmHg.   IAS/Shunts: No atrial level shunt detected by color flow Doppler.   Coronary CTA 09/18/2023 FINDINGS: Non-cardiac: See separate report from [facility]. No significant findings on limited lung and soft tissue windows.   Calcium Score: LM and 3 vessel calcium noted   LM 136   LAD 354   LCX 434   RCA 1075   Total 1999   Coronary Arteries: Right dominant with no anomalies   LM: 25-49% ostial calcified plaque   LAD: 25-49% calcified proximal disease, 50-69% calcified mid vessel disease   D1: Normal   D2: Normal   Circumflex: 25-49% proximal / mid vessel plaque   OM1: 25-49% calcific plaque   AV groove: Normal   RCA: 50-69% calcified ostial stenosis, 25-49% calcified mid/distal stenosis   PDA: Normal   PLA: Normal   IMPRESSION: 1. LM and 3 vessel calcium Score 1999 which is not ranked as patients age above cut off for MESA data base   2. Normal ascending thoracic aorta diameter 3.4 cm with severe calcific atherosclerosis   3.  CAD RADS 3 possibly obstructive CAD Study sent for FFR CT   Charlton Haws   Electronically Signed: By: Charlton Haws M.D. On: 09/18/2023 17:19   FFR 09/18/2023  FINDINGS: LAD: Normal 99 proximal, 0.97 mid and 0.94 distal   RCA: Normal 0.98 proximal, 0.94 mid and 0.95 distal   LCX: Normal 0.99 proximal and 0.99 mid   IMPRESSION: Negative FFR CT for obstrucitve CAD   Charlton Haws     Electronically Signed   By: Charlton Haws M.D.   On: 09/18/2023 17:23   Assessment & Plan   1.  Coronary artery disease-coronary CTA 09/18/2023 showed left main 25-49%, LAD 25-49%, normal D1 and D2,  circumflex 25-49%, OM1 25-49% calcified plaque, RCA 50-69% followed by 25-49% mid/distal stenosis.  FFR showed no obstructive coronary disease.  Details above. Continue heart healthy low-sodium high-fiber diet Amlodipine, ezetimibe, metoprolol, rosuvastatin  Cardiomyopathy-continues to note fatigue.  Weight today 144  pounds.    Echo 02/03/23 showed EF of 60-65%, intermediate diastolic parameters, mild mitral valve regurgitation, and moderate tricuspid valve regurgitation.  Her right atrium was severely dilated and left atrium was mildly dilated.    Coronary CTA with FFR reassuring. Continue furosemide to 40 mg 4 days/week and continue 20 mg 3 days/week. Daily weights [place] support stocking sheet reviewed  Paroxysmal atrial fibrillation-heart rate 73 bpm.  Regular.  Continues to notice intermittent episodes of palpitations.   CHA2DS2-VASc score 5.  She denies bleeding issues.  Continue Tikosyn, bisoprolol, apixaban to 5 mg twice daily Continue to avoid triggers Follows with A-fib clinic, EP  Essential hypertension-BP today 122/58. Maintain blood pressure log.  Continue bisoprolol, irbesartan, furosemide, amlodipine Heart healthy low-sodium diet Increase physical activity as tolerated   Shortness of breath/DOE-was noted to have spot on her lung which is felt to be lung cancer.  Pulmonology did not feel that her fatigue and DOE were affected by pathology.  Coronary CTA/FFR reassuring. Use incentive spirometer daily-DME prescription given Continue to monitor  Disposition: Follow-up with Dr. Rennis Golden or me in 4-6 months .   Thomasene Ripple. Jaziel Bennett NP-C     10/12/2023, 3:27 PM [facility] [address] Office [phone] Fax [phone]    I spent 14 minutes examining this patient, reviewing medications, and using patient centered shared decision making involving her cardiac care.  Prior to her visit I spent greater than 20 minutes reviewing her past medical history,  medications, and prior cardiac tests.

## 2023-10-06 ENCOUNTER — Encounter: Payer: Self-pay | Admitting: Nurse Practitioner

## 2023-10-06 ENCOUNTER — Telehealth: Payer: Medicare Other | Admitting: Nurse Practitioner

## 2023-10-06 DIAGNOSIS — J449 Chronic obstructive pulmonary disease, unspecified: Secondary | ICD-10-CM | POA: Diagnosis not present

## 2023-10-06 DIAGNOSIS — R911 Solitary pulmonary nodule: Secondary | ICD-10-CM | POA: Diagnosis not present

## 2023-10-06 NOTE — Assessment & Plan Note (Signed)
Moderate COPD with moderate symptom burden.  No significant response to Trelegy.  Did have a recent AECOPD triggered by viral respiratory infection.  She is clinically improved and has recovered well.  Encouraged to remain active.  Avoid sick exposures. Remain up to date on vaccinations. Action plan in place.

## 2023-10-06 NOTE — Assessment & Plan Note (Signed)
Progressive hypermetabolic right lung nodule without evidence of disease spread, consistent with stage I primary bronchogenic carcinoma.  She was deemed high risk for biopsy given age, lung and cardiac disease.  She was referred to radiation oncology for empiric SBRT.  She has completed radiation treatments 11/26.  Has follow-up with Dr. Roselind Messier in 1 month.  Will have repeat CT imaging in 3 months.  Patient Instructions  Continue Albuterol inhaler 2 puffs every 6 hours as needed for shortness of breath or wheezing. Notify if symptoms persist despite rescue inhaler/neb use.  Continue Stiolto 2 puffs daily   Follow up with radiation/oncology as scheduled    Follow up in 3-4 months after next CT with Dr. Delton Coombes (1st) or Katie Cailie Bosshart,NP. If symptoms do not improve or worsen, please contact office for sooner follow up or seek emergency care.

## 2023-10-06 NOTE — Progress Notes (Signed)
Patient ID: Holly Benson, female     DOB: [birth year], 89 y.o.      MRN: [id number]  Chief Complaint  Patient presents with   Follow-up    Occasional cough and shortness of breath.     Virtual Visit via Video Note  I connected with Dallie Piles Heeren on 10/06/23 at 10:30 AM EST by a video enabled telemedicine application and verified that I am speaking with the correct person using two identifiers.  Location: Patient: Home Provider: Office   I discussed the limitations of evaluation and management by telemedicine and the availability of in person appointments. The patient expressed understanding and agreed to proceed.  History of Present Illness: 89 year old female, former smoker followed for COPD, lung nodule. She is a patient of Dr. Kavin Leech and last seen in office 08/31/2023 by [facility] NP. Past medical history significant for cardiomyopathy, HTN, PAF on Eliquis and amiodarone, complex sleep apnea, IDA, RLS, HLD.    TEST/EVENTS:  03/15/2021 PFT: FVC 104, FEV1 87, ratio 64, TLC 91, DLCO 54.  Mild obstructive airway disease without reversibility and moderately severe diffusion defect 01/08/2023 CT chest: Right upper lobe nodule 1.2 x 1 cm, overall stable 07/14/2023 CT chest: Right upper lobe nodule, 13 x 11 mm; slow increase in size when compared to prior 08/13/2023 PET scan: 13 mm nodule in right lung apex, mildly progressive, suspicious for primary bronchogenic carcinoma   07/30/2023: OV with Dr. Delton Coombes.  COPD on Stiolto.  Using albuterol once a day.  Slowly enlarging pulmonary nodule that has been followed with serial imaging and is PET positive. May be a candidate for empiric SBRT if PET scan is reassuring. She is willing to entertain this. Will discuss with radiation oncology.    08/31/2023: Ov with Matison Nuccio NP for follow-up with her daughter-in-law to discuss recent PET scan results.  The right lung nodule has mildly progressed and continues to be hypermetabolic.  No evidence of disease  spread, consistent with stage I primary bronchogenic carcinoma.  Previous discussion was to consider treat with empiric SBRT.  She would like to move forward with this.  Regarding her breathing, feels like she is at her baseline.  She gets short winded with moderate intensity activities.  She does have a daily cough with clear to white phlegm.  Tends to produce sputum in the morning and then clears up throughout the day.  Has an occasional wheeze.  She does have issues with allergies and has baseline postnasal drainage.  She denies any fevers, chills, night sweats, hemoptysis, anorexia, weight loss.  She uses Stiolto daily.  Takes albuterol twice a day usually.  Has never tried any other maintenance inhalers.  10/06/2023: Today - follow up Patient presents today for follow up via video visit. Since our last appointment, she completed radiation treatments 11/26 with Dr. Roselind Messier.  She tolerated them well without any significant side effects.  She has a follow-up appointment with him next month and then should have a repeat scan in 3 months.  She has not had any issues with night sweats, hemoptysis, anorexia, weight loss. She did try the Trelegy inhaler after her last visit.  Did not feel like this made a difference in her breathing.  She prefers her Stiolto.  Breathing for the most part has been stable.  She did have a viral respiratory infection a little over a week ago.  She was negative for COVID and flu.  She was treated by her PCP for AECOPD with Augmentin  and prednisone taper. Feels back to her baseline.  Cough is minimal with occasional clear to white phlegm.  Has not really noticed any wheezing.  Not having to use her albuterol as much.  Has not required it in the last couple days.  Allergies  Allergen Reactions   Lovenox [Enoxaparin Sodium] Hives and Rash    PT STATES SHE BROKE OUT IN A RASH HEAD TO TOE AND LASTED ABOUT 3 WEEKS    Metoprolol Succinate [Metoprolol] Other (See Comments)    Muscle  aches, hand pain, and tingling   Immunization History  Administered Date(s) Administered   Influenza, High Dose Seasonal PF 08/03/2017, 07/15/2018, 06/28/2019, 08/03/2020   Influenza,inj,Quad PF,6+ Mos 08/02/2018   Influenza-Unspecified 08/27/2021, 08/01/2022, 08/07/2023   PFIZER(Purple Top)SARS-COV-2 Vaccination 12/08/2019, 01/02/2020, 10/02/2020, 03/04/2021, 09/12/2021   PNEUMOCOCCAL CONJUGATE-20 05/28/2023   Pneumococcal Polysaccharide-23 01/27/2020   Td 02/22/2009   Tdap 05/28/2023   Yellow Fever 02/22/2009   Zoster Recombinant(Shingrix) 12/25/2017, 03/04/2021   Past Medical History:  Diagnosis Date   Arrhythmia    Arthritis    Atrial fibrillation (HCC)    Constipation    chronic   COPD (chronic obstructive pulmonary disease) (HCC)    Hyperlipidemia    Hypertension    pulmonary   OSA on CPAP    Osteoarthritis    Osteoporosis    Peripheral neuropathy    Pulmonary hypertension (HCC)    RLS (restless legs syndrome)    Sleep apnea     Tobacco History: Social History   Tobacco Use  Smoking Status Former   Current packs/day: 0.00   Average packs/day: 1.5 packs/day for 37.0 years (55.5 ttl pk-yrs)   Types: Cigarettes   Start date: 79   Quit date: 11/04/1991   Years since quitting: 31.9  Smokeless Tobacco Never  Tobacco Comments   Former smoker 03/14/22   Counseling given: Not Answered Tobacco comments: Former smoker 03/14/22   Outpatient Medications Prior to Visit  Medication Sig Dispense Refill   acetaminophen (TYLENOL) 325 MG tablet Take 650 mg by mouth as needed for moderate pain or headache.     albuterol (VENTOLIN HFA) 108 (90 Base) MCG/ACT inhaler INHALE TWO PUFFS BY MOUTH EVERY 6 HOURS AS NEEDED FOR WHEEZING OR FOR SHORTNESS OF BREATH 18 g 6   amLODipine (NORVASC) 5 MG tablet Take 1 tablet (5 mg total) by mouth daily. 30 tablet 7   apixaban (ELIQUIS) 5 MG TABS tablet Take 1 tablet (5 mg total) by mouth 2 (two) times daily. 20 tablet 0   bisoprolol  (ZEBETA) 10 MG tablet TAKE 1 TABLET BY MOUTH AT BEDTIME 90 tablet 3   Cholecalciferol (VITAMIN D3 MAXIMUM STRENGTH) 125 MCG (5000 UT) capsule Take 5,000 Units by mouth daily.     clonazePAM (KLONOPIN) 0.5 MG tablet Take 0.5 tablets (0.25 mg total) by mouth at bedtime as needed for anxiety. (Patient taking differently: Take 0.25 mg by mouth at bedtime as needed (Restless leg).) 30 tablet 2   docusate sodium (COLACE) 100 MG capsule Take 300 mg by mouth at bedtime.     dofetilide (TIKOSYN) 250 MCG capsule TAKE 1 CAPSULE BY MOUTH 2 TIMES A DAY. 180 capsule 2   furosemide (LASIX) 20 MG tablet Take 2 tablets (40 mg total) by mouth 4 (four) times a week AND 1 tablet (20 mg total) 3 (three) times a week. Take one tablet by mouth on Monday, Wednesday and Friday. 36 tablet 1   irbesartan (AVAPRO) 300 MG tablet Take 1 tablet (300 mg total)  by mouth daily. 90 tablet 1   metoprolol tartrate (LOPRESSOR) 25 MG tablet Take 1 tablet (25 mg total) by mouth once for 1 dose. Two hours prior to  cardiac test 1 tablet 0   Misc Natural Products (COLON CLEANSE) CAPS Take 4 capsules by mouth as needed (constipation).     Multiple Vitamins-Minerals (PRESERVISION AREDS 2) CAPS Take 1 tablet by mouth 2 (two) times daily.     polyethylene glycol (MIRALAX / GLYCOLAX) 17 g packet Take 17 g by mouth at bedtime.     PRESCRIPTION MEDICATION at bedtime as needed (Sometimes). CPAP     rOPINIRole (REQUIP) 0.5 MG tablet One in AM and one after 3 Pm and one after dinner 8 Pm (Patient taking differently: Take 0.5 mg by mouth in the morning, at noon, in the evening, and at bedtime.) 90 tablet 5   rosuvastatin (CRESTOR) 20 MG tablet Take 1 tablet (20 mg total) by mouth daily. 90 tablet 3   trimethoprim-polymyxin b (POLYTRIM) ophthalmic solution Place 1 drop into both eyes every 4 (four) hours. 10 mL 0   predniSONE (DELTASONE) 10 MG tablet Take 4 tabs every morning for 3 days, 3 tabs for 2 days, 2 tabs for 2 days, 1 tab for 1 day. 23 tablet 0    Facility-Administered Medications Prior to Visit  Medication Dose Route Frequency Provider Last Rate Last Admin   technetium tetrofosmin (TC-MYOVIEW) injection 31.9 millicurie  31.9 millicurie Intravenous Once PRN Chilton Si, MD         Review of Systems:   Constitutional: No weight loss or gain, night sweats, fevers, chills,or lassitude. +baseline fatigue  HEENT: No headaches, difficulty swallowing, tooth/dental problems, or sore throat. No sneezing, itching, ear ache +chronic nasal congestion, post nasal drip CV:  No chest pain, orthopnea, PND, swelling in lower extremities, anasarca, dizziness, palpitations, syncope Resp: +baseline shortness of breath with exertion; baseline cough. No excess mucus or change in color of mucus. No hemoptysis. No wheezing.  No chest wall deformity GI:  No heartburn, indigestion GU: No dysuria, change in color of urine, urgency or frequency.  Skin: No rash, lesions, ulcerations MSK:  No joint pain or swelling.   Neuro: No dizziness or lightheadedness.  Psych: No depression or anxiety. Mood stable.   Observations/Objective: Patient is well-developed, well-nourished in no acute distress. A&Ox3. Resting comfortably at home. Unlabored breathing. Speech is clear and coherent with logical content.    Assessment and Plan: Lung nodule Progressive hypermetabolic right lung nodule without evidence of disease spread, consistent with stage I primary bronchogenic carcinoma.  She was deemed high risk for biopsy given age, lung and cardiac disease.  She was referred to radiation oncology for empiric SBRT.  She has completed radiation treatments 11/26.  Has follow-up with Dr. Roselind Messier in 1 month.  Will have repeat CT imaging in 3 months.  Patient Instructions  Continue Albuterol inhaler 2 puffs every 6 hours as needed for shortness of breath or wheezing. Notify if symptoms persist despite rescue inhaler/neb use.  Continue Stiolto 2 puffs daily   Follow up with  radiation/oncology as scheduled    Follow up in 3-4 months after next CT with Dr. Delton Coombes (1st) or Katie Zareen Jamison,NP. If symptoms do not improve or worsen, please contact office for sooner follow up or seek emergency care.    COPD (chronic obstructive pulmonary disease) (HCC) Moderate COPD with moderate symptom burden.  No significant response to Trelegy.  Did have a recent AECOPD triggered by viral respiratory infection.  She is clinically improved and has recovered well.  Encouraged to remain active.  Avoid sick exposures. Remain up to date on vaccinations. Action plan in place.     I discussed the assessment and treatment plan with the patient. The patient was provided an opportunity to ask questions and all were answered. The patient agreed with the plan and demonstrated an understanding of the instructions.   The patient was advised to call back or seek an in-person evaluation if the symptoms worsen or if the condition fails to improve as anticipated.  I provided 25 minutes of non-face-to-face time during this encounter.   Noemi Chapel, NP

## 2023-10-06 NOTE — Patient Instructions (Addendum)
Continue Albuterol inhaler 2 puffs every 6 hours as needed for shortness of breath or wheezing. Notify if symptoms persist despite rescue inhaler/neb use.  Continue Stiolto 2 puffs daily   Follow up with radiation/oncology as scheduled    Follow up in 3-4 months after next CT with Dr. Delton Coombes (1st) or Katie Thyra Yinger,NP. If symptoms do not improve or worsen, please contact office for sooner follow up or seek emergency care.

## 2023-10-07 ENCOUNTER — Other Ambulatory Visit: Payer: Self-pay | Admitting: *Deleted

## 2023-10-07 ENCOUNTER — Other Ambulatory Visit (HOSPITAL_COMMUNITY): Payer: Self-pay | Admitting: *Deleted

## 2023-10-07 ENCOUNTER — Ambulatory Visit (HOSPITAL_COMMUNITY)
Admission: RE | Admit: 2023-10-07 | Discharge: 2023-10-07 | Disposition: A | Discharge status: Home / Self Care | Payer: Medicare Other | Source: Ambulatory Visit | Attending: Physician Assistant | Admitting: Physician Assistant

## 2023-10-07 ENCOUNTER — Encounter (HOSPITAL_COMMUNITY): Payer: Self-pay | Admitting: Physician Assistant

## 2023-10-07 VITALS — BP 140/70 | HR 72 | Ht 62.0 in | Wt 141.8 lb

## 2023-10-07 DIAGNOSIS — Z5181 Encounter for therapeutic drug level monitoring: Secondary | ICD-10-CM | POA: Diagnosis not present

## 2023-10-07 DIAGNOSIS — I2510 Atherosclerotic heart disease of native coronary artery without angina pectoris: Secondary | ICD-10-CM | POA: Insufficient documentation

## 2023-10-07 DIAGNOSIS — D6869 Other thrombophilia: Secondary | ICD-10-CM | POA: Diagnosis not present

## 2023-10-07 DIAGNOSIS — I4819 Other persistent atrial fibrillation: Secondary | ICD-10-CM | POA: Insufficient documentation

## 2023-10-07 DIAGNOSIS — G4733 Obstructive sleep apnea (adult) (pediatric): Secondary | ICD-10-CM | POA: Insufficient documentation

## 2023-10-07 DIAGNOSIS — J449 Chronic obstructive pulmonary disease, unspecified: Secondary | ICD-10-CM | POA: Insufficient documentation

## 2023-10-07 DIAGNOSIS — E785 Hyperlipidemia, unspecified: Secondary | ICD-10-CM

## 2023-10-07 DIAGNOSIS — I110 Hypertensive heart disease with heart failure: Secondary | ICD-10-CM | POA: Insufficient documentation

## 2023-10-07 DIAGNOSIS — Z7901 Long term (current) use of anticoagulants: Secondary | ICD-10-CM | POA: Diagnosis not present

## 2023-10-07 DIAGNOSIS — I5032 Chronic diastolic (congestive) heart failure: Secondary | ICD-10-CM | POA: Insufficient documentation

## 2023-10-07 DIAGNOSIS — Z79899 Other long term (current) drug therapy: Secondary | ICD-10-CM | POA: Diagnosis not present

## 2023-10-07 LAB — CBC
HCT: 43.2 % (ref 36.0–46.0)
Hemoglobin: 13.6 g/dL (ref 12.0–15.0)
MCH: 32.9 pg (ref 26.0–34.0)
MCHC: 31.5 g/dL (ref 30.0–36.0)
MCV: 104.6 fL — ABNORMAL HIGH (ref 80.0–100.0)
Platelets: 282 10*3/uL (ref 150–400)
RBC: 4.13 MIL/uL (ref 3.87–5.11)
RDW: 13.7 % (ref 11.5–15.5)
WBC: 8.7 10*3/uL (ref 4.0–10.5)
nRBC: 0 % (ref 0.0–0.2)

## 2023-10-07 LAB — BASIC METABOLIC PANEL
Anion gap: 7 (ref 5–15)
BUN: 21 mg/dL (ref 8–23)
CO2: 25 mmol/L (ref 22–32)
Calcium: 8.8 mg/dL — ABNORMAL LOW (ref 8.9–10.3)
Chloride: 107 mmol/L (ref 98–111)
Creatinine, Ser: 1.05 mg/dL — ABNORMAL HIGH (ref 0.44–1.00)
GFR, Estimated: 51 mL/min — ABNORMAL LOW (ref >60)
Glucose, Bld: 98 mg/dL (ref 70–99)
Potassium: 4.4 mmol/L (ref 3.5–5.1)
Sodium: 139 mmol/L (ref 135–145)

## 2023-10-07 LAB — MAGNESIUM: Magnesium: 2.4 mg/dL (ref 1.7–2.4)

## 2023-10-07 NOTE — Progress Notes (Signed)
Primary Care Physician: Philip Aspen, Limmie Patricia, MD Primary Cardiologist: Dr Rennis Golden Primary Electrophysiologist: Dr Elberta Fortis Referring Physician: Dr Alfonse Ras Holly Benson is a 89 y.o. female with a history of HTN, atrial flutter, HLD, OSA, COPD, PAD, and atrial fibrillation who presents for follow up in the [facility]. She was previously on amiodarone but this was discontinued due to concern about possible lung toxicity. She states that over the last few weeks she has had frequent "fluttering" sensations in her chest which last 10-15 minutes but can happen multiple times per day. She states that this feels "totally different" from her afib in the past. She denies any other symptoms. Patient is on Eliquis for a CHADS2VASC score of 5.  When she was seen July 2022, tikosyn was discussed but pt was not ready to commit. Now s/p dofetilide admission 4/18-4/21/23. She was found to be in atrial flutter at her visit on 02/19/23 and was scheduled for DCCV on 5/9 but she arrived in SR and the procedure was cancelled.   On follow up today, patient reports that she has done well from a cardiac standpoint. She was ill with an URI and had some afib symptoms but both have resolved. No bleeding issues on anticoagulation.   Today, she denies symptoms of palpitations, chest pain, orthopnea, PND, dizziness, presyncope, syncope, snoring, daytime somnolence, bleeding, or neurologic sequela. The patient is tolerating medications without difficulties and is otherwise without complaint today.    Atrial Fibrillation Risk Factors:  she does have symptoms or diagnosis of sleep apnea. she is compliant with CPAP therapy. she does not have a history of rheumatic fever.   Atrial Fibrillation Management history:  Previous antiarrhythmic drugs: flecainide, amiodarone, dofetilide   Previous cardioversions: none Previous ablations: none Anticoagulation history: Eliquis   Past  Medical History:  Diagnosis Date   Arrhythmia    Arthritis    Atrial fibrillation (HCC)    Constipation    chronic   COPD (chronic obstructive pulmonary disease) (HCC)    Hyperlipidemia    Hypertension    pulmonary   OSA on CPAP    Osteoarthritis    Osteoporosis    Peripheral neuropathy    Pulmonary hypertension (HCC)    RLS (restless legs syndrome)    Sleep apnea     Current Outpatient Medications  Medication Sig Dispense Refill   acetaminophen (TYLENOL) 325 MG tablet Take 650 mg by mouth as needed for moderate pain or headache.     albuterol (VENTOLIN HFA) 108 (90 Base) MCG/ACT inhaler INHALE TWO PUFFS BY MOUTH EVERY 6 HOURS AS NEEDED FOR WHEEZING OR FOR SHORTNESS OF BREATH 18 g 6   amLODipine (NORVASC) 5 MG tablet Take 1 tablet (5 mg total) by mouth daily. 30 tablet 7   apixaban (ELIQUIS) 5 MG TABS tablet Take 1 tablet (5 mg total) by mouth 2 (two) times daily. 20 tablet 0   bisoprolol (ZEBETA) 10 MG tablet TAKE 1 TABLET BY MOUTH AT BEDTIME 90 tablet 3   Cholecalciferol (VITAMIN D3 MAXIMUM STRENGTH) 125 MCG (5000 UT) capsule Take 5,000 Units by mouth daily.     clonazePAM (KLONOPIN) 0.5 MG tablet Take 0.5 tablets (0.25 mg total) by mouth at bedtime as needed for anxiety. (Patient taking differently: Take 0.25 mg by mouth at bedtime as needed (Restless leg).) 30 tablet 2   docusate sodium (COLACE) 100 MG capsule Take 300 mg by mouth at bedtime.     dofetilide (TIKOSYN) 250 MCG capsule  TAKE 1 CAPSULE BY MOUTH 2 TIMES A DAY. 180 capsule 2   furosemide (LASIX) 20 MG tablet Take 2 tablets (40 mg total) by mouth 4 (four) times a week AND 1 tablet (20 mg total) 3 (three) times a week. Take one tablet by mouth on Monday, Wednesday and Friday. 36 tablet 1   irbesartan (AVAPRO) 300 MG tablet Take 1 tablet (300 mg total) by mouth daily. 90 tablet 1   Misc Natural Products (COLON CLEANSE) CAPS Take 4 capsules by mouth as needed (constipation).     Multiple Vitamins-Minerals (PRESERVISION  AREDS 2) CAPS Take 1 tablet by mouth 2 (two) times daily.     polyethylene glycol (MIRALAX / GLYCOLAX) 17 g packet Take 17 g by mouth at bedtime.     PRESCRIPTION MEDICATION at bedtime as needed (Sometimes). CPAP     rOPINIRole (REQUIP) 0.5 MG tablet One in AM and one after 3 Pm and one after dinner 8 Pm (Patient taking differently: Take 0.5 mg by mouth in the morning, at noon, in the evening, and at bedtime.) 90 tablet 5   rosuvastatin (CRESTOR) 20 MG tablet Take 1 tablet (20 mg total) by mouth daily. 90 tablet 3   trimethoprim-polymyxin b (POLYTRIM) ophthalmic solution Place 1 drop into both eyes every 4 (four) hours. 10 mL 0   metoprolol tartrate (LOPRESSOR) 25 MG tablet Take 1 tablet (25 mg total) by mouth once for 1 dose. Two hours prior to  cardiac test 1 tablet 0   No current facility-administered medications for this encounter.   Facility-Administered Medications Ordered in Other Encounters  Medication Dose Route Frequency Provider Last Rate Last Admin   technetium tetrofosmin (TC-MYOVIEW) injection 31.9 millicurie  31.9 millicurie Intravenous Once PRN Chilton Si, MD        ROS- All systems are reviewed and negative except as per the HPI above.  Physical Exam: Vitals:   10/07/23 0917  BP: (!) 140/70  Pulse: 72  Weight: 64.3 kg  Height: 5\' 2"  (1.575 m)     GEN: Well nourished, well developed in no acute distress NECK: No JVD; No carotid bruits CARDIAC: Regular rate and rhythm, no murmurs, rubs, gallops RESPIRATORY:  Clear to auscultation without rales, wheezing or rhonchi  ABDOMEN: Soft, non-tender, non-distended EXTREMITIES:  No edema; No deformity    Wt Readings from Last 3 Encounters:  10/07/23 64.3 kg  09/23/23 65 kg  09/10/23 65.4 kg    EKG today demonstrates  SR Vent. rate 72 BPM PR interval 164 ms QRS duration 90 ms QT/QTcB 416/455 ms   Epic records are reviewed at length today   Echo 10/22/21 1. Left ventricular ejection fraction, by  estimation, is 65 to 70%. The left ventricle has normal function. The left ventricle has no regional wall motion abnormalities. Left ventricular diastolic function could not be evaluated. The average left ventricular global longitudinal strain is -10.5 %. The global longitudinal strain is abnormal.   2. Right ventricular systolic function is moderately reduced. The right  ventricular size is moderately enlarged. There is mildly elevated  pulmonary artery systolic pressure. The estimated right ventricular  systolic pressure is 39.6 mmHg.   3. Left atrial size was severely dilated.   4. Right atrial size was severely dilated.   5. The mitral valve is normal in structure. Mild to moderate mitral valve regurgitation. No evidence of mitral stenosis.   6. Tricuspid valve regurgitation is severe.   7. The aortic valve is normal in structure. Aortic valve regurgitation is  mild. No aortic stenosis is present.   8. The inferior vena cava is dilated in size with >50% respiratory  variability, suggesting right atrial pressure of 8 mmHg.   Comparison(s): EF 60%, RA & LA severe dilated, moderate TR.    CHA2DS2-VASc Score = 6  The patient's score is based upon: CHF History: 1 HTN History: 1 Diabetes History: 0 Stroke History: 0 Vascular Disease History: 1 Age Score: 2 Gender Score: 1        ASSESSMENT AND PLAN: Persistent Atrial Fibrillation (ICD10:  I48.19) The patient's CHA2DS2-VASc score is 6, indicating a 9.7% annual risk of stroke.   Failed flecainide and amiodarone S/p dofetilide loading 4/18-4/21/23 Patient appears to be maintaining SR, did have some afib while acutely ill with URI.  Continue dofetilide 250 mcg BID, QT stable Check bmet/mag/cbc today Continue Eliquis  5 mg BID Continue bisoprolol 10 mg daily  Secondary Hypercoagulable State (ICD10:  D68.69) The patient is at significant risk for stroke/thromboembolism based upon her CHA2DS2-VASc Score of 6.  Continue Apixaban  (Eliquis).   HTN Stable on current regimen  OSA  Encouraged nightly CPAP  Chronic HFpEF Fluid status appears stable  CAD CAC score 1999, FFR negative No anginal symptoms   Follow up in the AF clinic in 6 months.    Jorja Loa PA-C [facility] [address] [place], [place] [address] [phone]

## 2023-10-12 ENCOUNTER — Ambulatory Visit: Payer: Medicare Other | Attending: General Practice | Admitting: General Practice

## 2023-10-12 ENCOUNTER — Encounter: Payer: Self-pay | Admitting: General Practice

## 2023-10-12 VITALS — BP 122/58 | HR 73 | Ht 62.0 in | Wt 144.0 lb

## 2023-10-12 DIAGNOSIS — I428 Other cardiomyopathies: Secondary | ICD-10-CM | POA: Insufficient documentation

## 2023-10-12 DIAGNOSIS — R0609 Other forms of dyspnea: Secondary | ICD-10-CM | POA: Insufficient documentation

## 2023-10-12 DIAGNOSIS — I480 Paroxysmal atrial fibrillation: Secondary | ICD-10-CM | POA: Diagnosis not present

## 2023-10-12 DIAGNOSIS — I10 Essential (primary) hypertension: Secondary | ICD-10-CM | POA: Insufficient documentation

## 2023-10-12 DIAGNOSIS — G4733 Obstructive sleep apnea (adult) (pediatric): Secondary | ICD-10-CM | POA: Insufficient documentation

## 2023-10-12 DIAGNOSIS — I2510 Atherosclerotic heart disease of native coronary artery without angina pectoris: Secondary | ICD-10-CM | POA: Diagnosis not present

## 2023-10-12 DIAGNOSIS — R0602 Shortness of breath: Secondary | ICD-10-CM | POA: Diagnosis not present

## 2023-10-12 IMAGING — CT CT ABD-PELV W/ CM
2 of 5 series · 16 of 46 positions shown, 18 images · IV contrast (APPLIED)
Comparison: January 04, 2021.

CLINICAL DATA: Acute abdominal pain.

EXAM:
CT ABDOMEN AND PELVIS WITH CONTRAST
TECHNIQUE: Multidetector CT imaging of the abdomen and pelvis was performed
using the standard protocol following bolus administration of
intravenous contrast.
CONTRAST:  75mL OMNIPAQUE IOHEXOL 300 MG/ML  SOLN

[Series 2: abd pel w · axial · 0.77mm/px · z∈[-406,-31]mm · 13 of 85 slices shown, 15 images]
[im 5/85  soft-tissue]
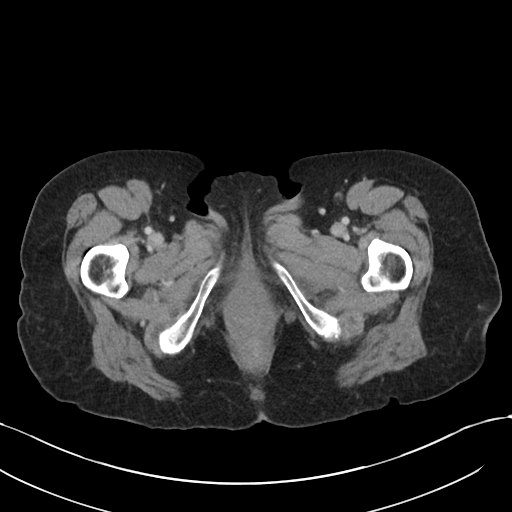
[im 5/85  bone]
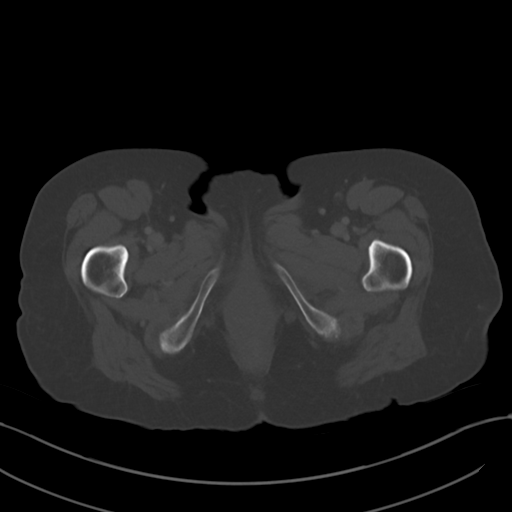
[im 10/85  soft-tissue]
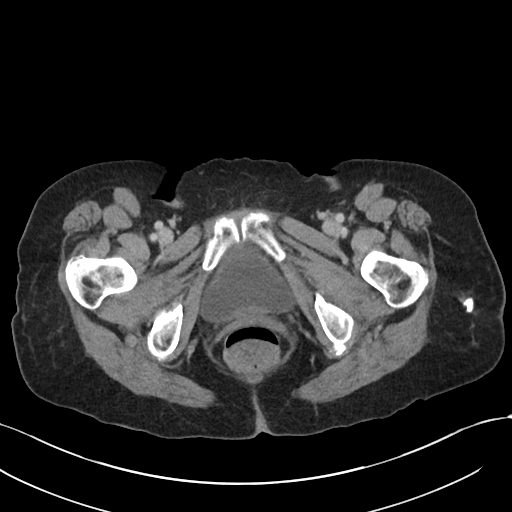
[im 19/85  soft-tissue]
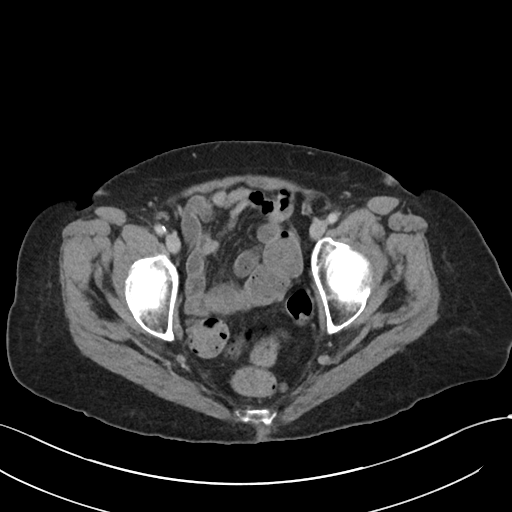
[im 24/85  soft-tissue]
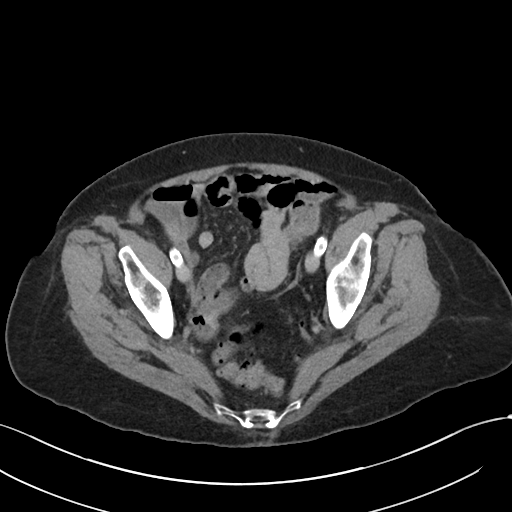
[im 29/85  soft-tissue]
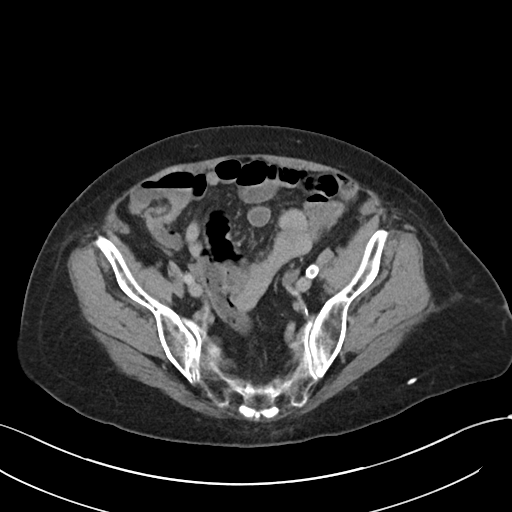
[im 38/85  soft-tissue]
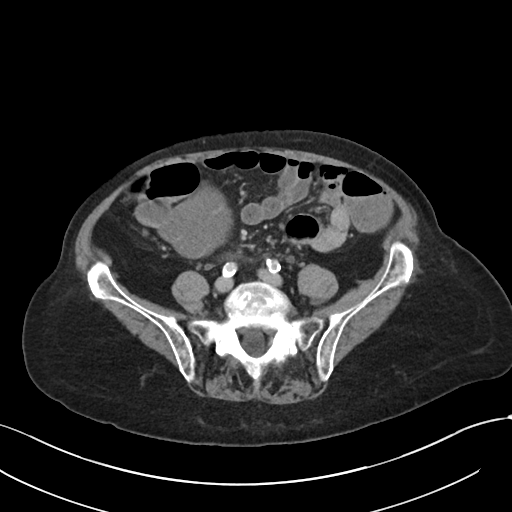
[im 43/85  soft-tissue]
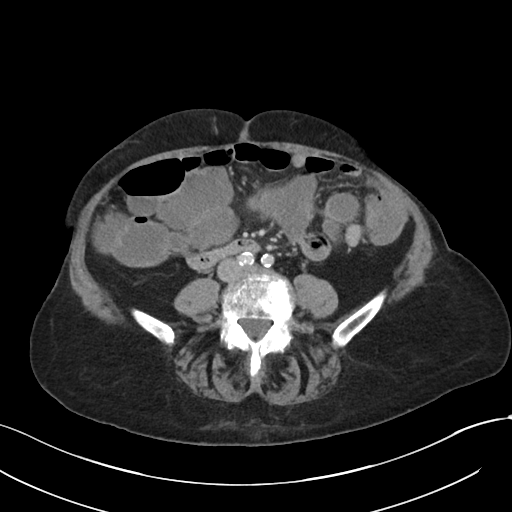
[im 47/85  soft-tissue]
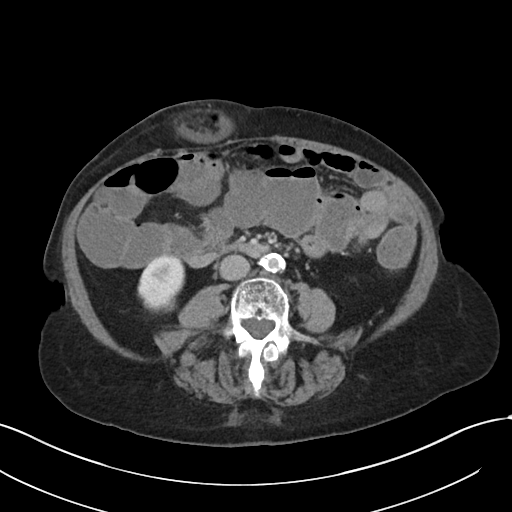
[im 57/85  soft-tissue]
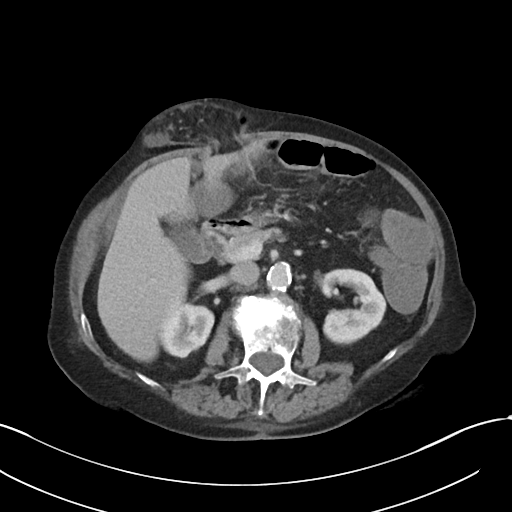
[im 57/85  bone]
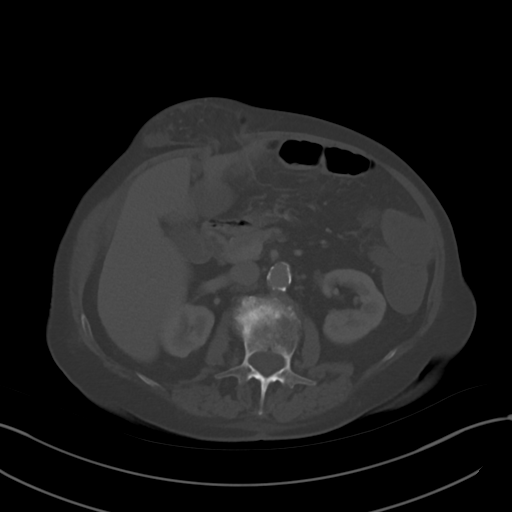
[im 61/85  soft-tissue]
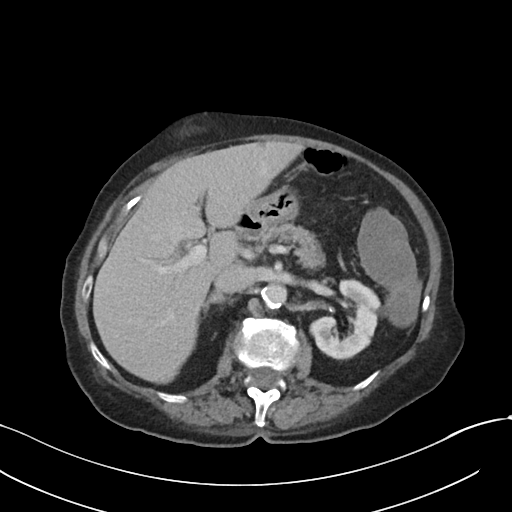
[im 66/85  soft-tissue]
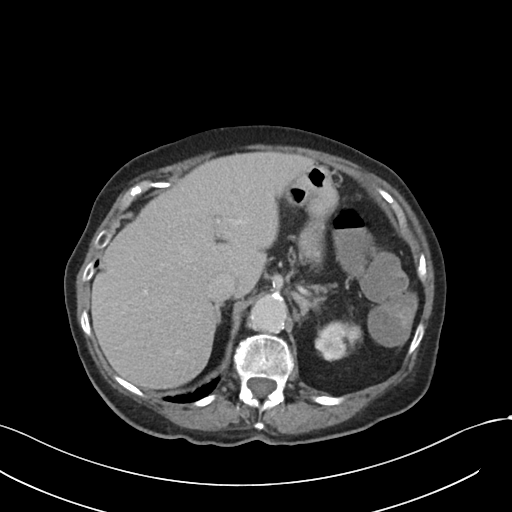
[im 75/85  soft-tissue]
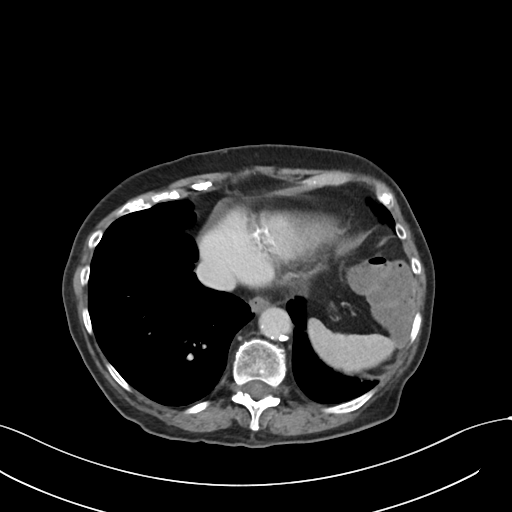
[im 80/85  soft-tissue]
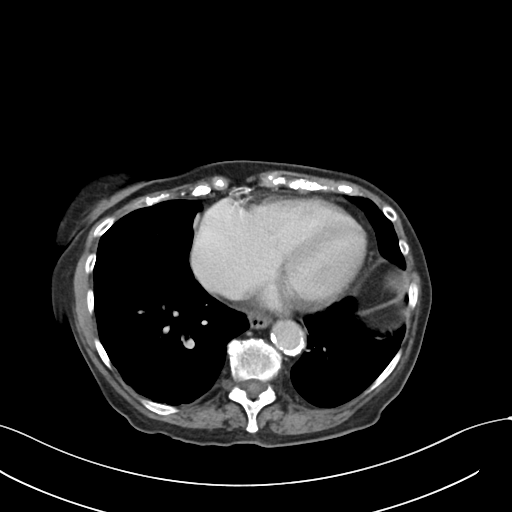

[Series 5: coronal · coronal · 0.77mm/px · 3 of 93 slices shown]
[im 31/93  soft-tissue]
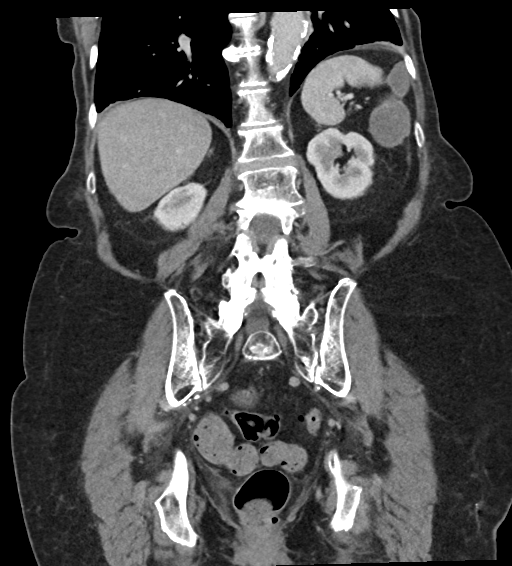
[im 41/93  soft-tissue]
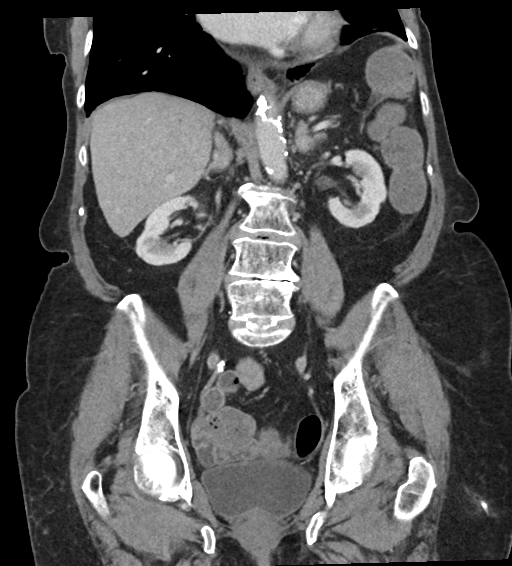
[im 52/93  soft-tissue]
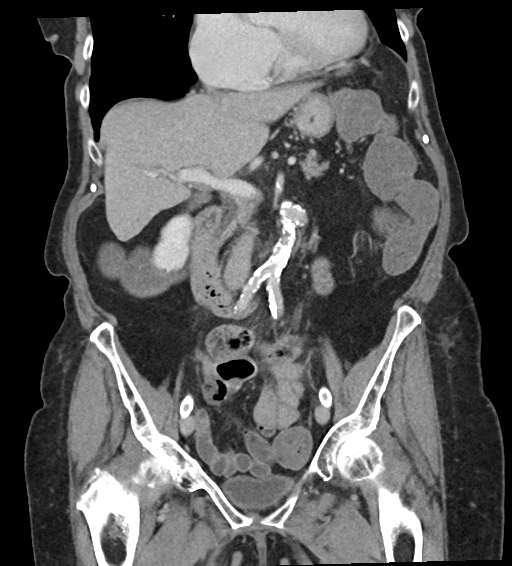

[16 of 46 positions shown; findings below may reference images not displayed]

FINDINGS: Lower chest: No acute abnormality.

Hepatobiliary: No focal liver abnormality is seen. No gallstones,
gallbladder wall thickening, or biliary dilatation.

Pancreas: Unremarkable. No pancreatic ductal dilatation or
surrounding inflammatory changes.

Spleen: Normal in size without focal abnormality.

Adrenals/Urinary Tract: Adrenal glands are unremarkable. Kidneys are
normal, without renal calculi, focal lesion, or hydronephrosis.
Bladder is unremarkable.

Stomach/Bowel: Stomach appears normal. There is again noted a
moderate size ventral hernia in the right upper quadrant which
appears to contain a portion of small bowel, but does not result in
definite bowel obstruction. The colon is dilated and fluid-filled.
No definite small bowel dilatation is noted.

Vascular/Lymphatic: Aortic atherosclerosis. No enlarged abdominal or
pelvic lymph nodes.

Reproductive: Status post hysterectomy. No adnexal masses.

Other: No abdominal wall hernia or abnormality. No abdominopelvic
ascites.

Musculoskeletal: No acute or significant osseous findings.
IMPRESSION: There is again noted a moderate size ventral hernia in the right
upper quadrant which appears to contain a loop of small bowel, but
does not result in small bowel obstruction. The colon is dilated and
fluid-filled suggesting possible ileus. Stool is noted in the
sigmoid colon and rectum.

## 2023-10-12 NOTE — Patient Instructions (Signed)
Medication Instructions:  The current medical regimen is effective;  continue present plan and medications as directed. Please refer to the Current Medication list given to you today.  *If you need a refill on your cardiac medications before your next appointment, please call your pharmacy*  Lab Work: FASTING LIPID AND LFT BEGINNING OF MARCH 2025 If you have labs (blood work) drawn today and your tests are completely normal, you will receive your results only by:  MyChart Message (if you have MyChart) OR  A paper copy in the mail If you have any lab test that is abnormal or we need to change your treatment, we will call you to review the results.  Other Instructions PLEASE READ AND FOLLOW ATTACHED INCREASED FIBER DIET MAINTAIN YOUR PHYSICAL ACTIVITY PLEASE PURCHASE AND USE INCENTIVE SPIROMETER  Follow-Up: At [facility], you and your health needs are our priority.  As part of our continuing mission to provide you with exceptional heart care, we have created designated Provider Care Teams.  These Care Teams include your primary Cardiologist (physician) and Advanced Practice Providers (APPs -  Physician Assistants and Nurse Practitioners) who all work together to provide you with the care you need, when you need it.  Your next appointment:   6 month(s)  Provider:   Chrystie Nose, MD  or Edd Fabian, FNP          Heart-Healthy Eating Plan Eating a healthy diet is important for the health of your heart. A heart-healthy eating plan includes: Eating less unhealthy fats. Eating more healthy fats. Eating less salt in your food. Salt is also called sodium. Making other changes in your diet. Talk with your doctor or a diet specialist (dietitian) to create an eating plan that is right for you. What is my plan? What are tips for following this plan? Cooking Avoid frying your food. Try to bake, boil, grill, or broil it instead. You can also reduce fat by: Removing the skin from  poultry. Removing all visible fats from meats. Steaming vegetables in water or broth. Meal planning  At meals, divide your plate into four equal parts: Fill one-half of your plate with vegetables and green salads. Fill one-fourth of your plate with whole grains. Fill one-fourth of your plate with lean protein foods. Eat 2-4 cups of vegetables per day. One cup of vegetables is: 1 cup (91 g) broccoli or cauliflower florets. 2 medium carrots. 1 large bell pepper. 1 large sweet potato. 1 large tomato. 1 medium white potato. 2 cups (150 g) raw leafy greens. Eat 1-2 cups of fruit per day. One cup of fruit is: 1 small apple 1 large banana 1 cup (237 g) mixed fruit, 1 large orange,  cup (82 g) dried fruit, 1 cup (240 mL) 100% fruit juice. Eat more foods that have soluble fiber. These are apples, broccoli, carrots, beans, peas, and barley. Try to get 20-30 g of fiber per day. Eat 4-5 servings of nuts, legumes, and seeds per week: 1 serving of dried beans or legumes equals  cup (90 g) cooked. 1 serving of nuts is  oz (12 almonds, 24 pistachios, or 7 walnut halves). 1 serving of seeds equals  oz (8 g). General information Eat more home-cooked food. Eat less restaurant, buffet, and fast food. Limit or avoid alcohol. Limit foods that are high in starch and sugar. Avoid fried foods. Lose weight if you are overweight. Keep track of how much salt (sodium) you eat. This is important if you have high  blood pressure. Ask your doctor to tell you more about this. Try to add vegetarian meals each week. Fats Choose healthy fats. These include olive oil and canola oil, flaxseeds, walnuts, almonds, and seeds. Eat more omega-3 fats. These include salmon, mackerel, sardines, tuna, flaxseed oil, and ground flaxseeds. Try to eat fish at least 2 times each week. Check food labels. Avoid foods with trans fats or high amounts of saturated fat. Limit saturated fats. These are often found in animal  products, such as meats, butter, and cream. These are also found in plant foods, such as palm oil, palm kernel oil, and coconut oil. Avoid foods with partially hydrogenated oils in them. These have trans fats. Examples are stick margarine, some tub margarines, cookies, crackers, and other baked goods. What foods should I eat? Fruits All fresh, canned (in natural juice), or frozen fruits. Vegetables Fresh or frozen vegetables (raw, steamed, roasted, or grilled). Green salads. Grains Most grains. Choose whole wheat and whole grains most of the time. Rice and pasta, including brown rice and pastas made with whole wheat. Meats and other proteins Lean, well-trimmed beef, veal, pork, and lamb. Chicken and [place] without skin. All fish and shellfish. Wild duck, rabbit, pheasant, and venison. Egg whites or low-cholesterol egg substitutes. Dried beans, peas, lentils, and tofu. Seeds and most nuts. Dairy Low-fat or nonfat cheeses, including ricotta and mozzarella. Skim or 1% milk that is liquid, powdered, or evaporated. Buttermilk that is made with low-fat milk. Nonfat or low-fat yogurt. Fats and oils Non-hydrogenated (trans-free) margarines. Vegetable oils, including soybean, sesame, sunflower, olive, peanut, safflower, corn, canola, and cottonseed. Salad dressings or mayonnaise made with a vegetable oil. Beverages Mineral water. Coffee and tea. Diet carbonated beverages. Sweets and desserts Sherbet, gelatin, and fruit ice. Small amounts of dark chocolate. Limit all sweets and desserts. Seasonings and condiments All seasonings and condiments. The items listed above may not be a complete list of foods and drinks you can eat. Contact a dietitian for more options. What foods should I avoid? Fruits Canned fruit in heavy syrup. Fruit in cream or butter sauce. Fried fruit. Limit coconut. Vegetables Vegetables cooked in cheese, cream, or butter sauce. Fried vegetables. Grains Breads that are made with  saturated or trans fats, oils, or whole milk. Croissants. Sweet rolls. Donuts. High-fat crackers, such as cheese crackers. Meats and other proteins Fatty meats, such as hot dogs, ribs, sausage, bacon, rib-eye roast or steak. High-fat deli meats, such as salami and bologna. Caviar. Domestic duck and goose. Organ meats, such as liver. Dairy Cream, sour cream, cream cheese, and creamed cottage cheese. Whole-milk cheeses. Whole or 2% milk that is liquid, evaporated, or condensed. Whole buttermilk. Cream sauce or high-fat cheese sauce. Yogurt that is made from whole milk. Fats and oils Meat fat, or shortening. Cocoa butter, hydrogenated oils, palm oil, coconut oil, palm kernel oil. Solid fats and shortenings, including bacon fat, salt pork, lard, and butter. Nondairy cream substitutes. Salad dressings with cheese or sour cream. Beverages Regular sodas and juice drinks with added sugar. Sweets and desserts Frosting. Pudding. Cookies. Cakes. Pies. Milk chocolate or white chocolate. Buttered syrups. Full-fat ice cream or ice cream drinks. The items listed above may not be a complete list of foods and drinks to avoid. Contact a dietitian for more information. Summary Heart-healthy meal planning includes eating less unhealthy fats, eating more healthy fats, and making other changes in your diet. Eat a balanced diet. This includes fruits and vegetables, low-fat or nonfat dairy, lean protein, nuts and  legumes, whole grains, and heart-healthy oils and fats. This information is not intended to replace advice given to you by your health care provider. Make sure you discuss any questions you have with your health care provider. Document Revised: 11/25/2021 Document Reviewed: 11/25/2021 Elsevier Patient Education  2024 [employer].

## 2023-10-16 ENCOUNTER — Other Ambulatory Visit: Payer: Self-pay | Admitting: Internal Medicine

## 2023-10-16 DIAGNOSIS — I480 Paroxysmal atrial fibrillation: Secondary | ICD-10-CM

## 2023-10-26 ENCOUNTER — Ambulatory Visit: Payer: Self-pay | Admitting: Radiation Oncology

## 2023-10-27 ENCOUNTER — Encounter: Payer: Self-pay | Admitting: Radiation Oncology

## 2023-11-02 ENCOUNTER — Encounter: Payer: Self-pay | Admitting: Radiation Oncology

## 2023-11-02 ENCOUNTER — Ambulatory Visit
Admission: RE | Admit: 2023-11-02 | Discharge: 2023-11-02 | Disposition: A | Discharge status: Home / Self Care | Payer: Medicare Other | Source: Ambulatory Visit | Attending: Radiation Oncology | Admitting: Radiation Oncology

## 2023-11-02 VITALS — BP 135/55 | HR 74 | Temp 97.0°F | Resp 20 | Ht 62.0 in | Wt 142.6 lb

## 2023-11-02 DIAGNOSIS — R911 Solitary pulmonary nodule: Secondary | ICD-10-CM | POA: Diagnosis not present

## 2023-11-02 DIAGNOSIS — Z923 Personal history of irradiation: Secondary | ICD-10-CM | POA: Diagnosis not present

## 2023-11-02 HISTORY — DX: Personal history of irradiation: Z92.3

## 2023-11-02 NOTE — Progress Notes (Signed)
Radiation Oncology         [phone] ________________________________  Name: Holly Benson MRN: [id number]  Date: 11/02/2023  DOB: [birth year]  Follow-Up Visit Note  CC: Philip Aspen, Limmie Patricia, MD  Leslye Peer, MD    ICD-10-CM   1. Lung nodule  R91.1 CT CHEST WO CONTRAST      Diagnosis: The encounter diagnosis was Lung nodule [R91.1].   Slowly enlarging RUL pulmonary nodule, with features suggestive of non-small cell lung cancer (non-biopsy proven)     Interval Since Last Radiation: 1 month and 4 days   Indication for treatment: Curative         Radiation treatment dates: First Treatment Date: 2023-09-22 - Last Treatment Date: 2023-09-29   Site/Dose/Technique/Mode:    Site: Lung, Right Technique: SBRT/SRT-IMRT Mode: Photon Dose Per Fraction: 18 Gy Prescribed Dose (Delivered / Prescribed): 54 Gy / 54 Gy Prescribed Fxs (Delivered / Prescribed): 3 / 3  Narrative:  The patient returns today for routine follow-up. She tolerated radiation treatment relatively well. During her final weekly treatment check on 09/29/23, the patient endorsed fatigue and SOB. She was also noted to have an elevated heart rate which her cardiologist noted to be related to her radiation therapy.    Since her last visit, she most recently followed up with [facility] on 10/06/23. At that time, she was advised to continue on her Albuterol inhaler as needed for SOB and wheezing. She will also continue on Stiolto daily.      She also had a cardiac scoring CT performed on 09/18/23 for evaluation of chest pain. A chest CT over-read was reported which showed adjacent 4 mm and 5 mm anteromedial LUL pulmonary nodules vs focal scarring which may warrant attention to follow-up. Her cardiac findings were otherwise normal.    Patient reports to be doing well overall today.  She denies any shortness of breath, cough, hemoptysis, or chest pain.  Her appetite levels have been good and she denies  any pain with swallowing or dysphagia.  She states that she tolerated the radiation treatment well.                       Allergies:  is allergic to lovenox [enoxaparin sodium] and metoprolol succinate [metoprolol].  Meds: Current Outpatient Medications  Medication Sig Dispense Refill   acetaminophen (TYLENOL) 325 MG tablet Take 650 mg by mouth as needed for moderate pain or headache.     albuterol (VENTOLIN HFA) 108 (90 Base) MCG/ACT inhaler INHALE TWO PUFFS BY MOUTH EVERY 6 HOURS AS NEEDED FOR WHEEZING OR FOR SHORTNESS OF BREATH 18 g 6   apixaban (ELIQUIS) 5 MG TABS tablet Take 1 tablet (5 mg total) by mouth 2 (two) times daily. 20 tablet 0   bisoprolol (ZEBETA) 10 MG tablet TAKE 1 TABLET BY MOUTH AT BEDTIME 90 tablet 3   Cholecalciferol (VITAMIN D3 MAXIMUM STRENGTH) 125 MCG (5000 UT) capsule Take 5,000 Units by mouth daily.     clonazePAM (KLONOPIN) 0.5 MG tablet Take 0.5 tablets (0.25 mg total) by mouth at bedtime as needed for anxiety. (Patient taking differently: Take 0.25 mg by mouth at bedtime as needed (Restless leg).) 30 tablet 2   docusate sodium (COLACE) 100 MG capsule Take 300 mg by mouth at bedtime.     dofetilide (TIKOSYN) 250 MCG capsule TAKE 1 CAPSULE BY MOUTH 2 TIMES A DAY. 180 capsule 2   furosemide (LASIX) 20 MG tablet TAKE 1 TABLET BY MOUTH ON  MONDAY, WEDNESDAY, AND FRIDAY. TAKE 2 TABLETS BY MOUTH ON ALL OTHER DAYS. 36 tablet 1   irbesartan (AVAPRO) 300 MG tablet Take 1 tablet (300 mg total) by mouth daily. 90 tablet 1   Misc Natural Products (COLON CLEANSE) CAPS Take 4 capsules by mouth as needed (constipation).     Multiple Vitamins-Minerals (PRESERVISION AREDS 2) CAPS Take 1 tablet by mouth 2 (two) times daily.     polyethylene glycol (MIRALAX / GLYCOLAX) 17 g packet Take 17 g by mouth at bedtime.     PRESCRIPTION MEDICATION at bedtime as needed (Sometimes). CPAP     rOPINIRole (REQUIP) 0.5 MG tablet One in AM and one after 3 Pm and one after dinner 8 Pm (Patient taking  differently: Take 0.5 mg by mouth in the morning, at noon, in the evening, and at bedtime.) 90 tablet 5   rosuvastatin (CRESTOR) 20 MG tablet Take 1 tablet (20 mg total) by mouth daily. 90 tablet 3   amLODipine (NORVASC) 5 MG tablet Take 1 tablet (5 mg total) by mouth daily. 30 tablet 7   metoprolol tartrate (LOPRESSOR) 25 MG tablet Take 1 tablet (25 mg total) by mouth once for 1 dose. Two hours prior to  cardiac test 1 tablet 0   trimethoprim-polymyxin b (POLYTRIM) ophthalmic solution Place 1 drop into both eyes every 4 (four) hours. (Patient not taking: Reported on 11/02/2023) 10 mL 0   No current facility-administered medications for this encounter.   Facility-Administered Medications Ordered in Other Encounters  Medication Dose Route Frequency Provider Last Rate Last Admin   technetium tetrofosmin (TC-MYOVIEW) injection 31.9 millicurie  31.9 millicurie Intravenous Once PRN Chilton Si, MD        Physical Findings: [name]. [name].  height is 5\' 2"  (1.575 m) and weight is 142 lb 9.6 oz (64.7 kg). Her temperature is 97 F (36.1 C) (abnormal). Her blood pressure is 135/55 (abnormal) and her pulse is 74. Her respiration is 20 and oxygen saturation is 95%.  No significant changes. Lungs are clear to auscultation bilaterally. Heart has regular rate and rhythm. No palpable cervical, supraclavicular, or axillary adenopathy. Abdomen soft, non-tender, normal bowel sounds.   Lab Findings: Lab Results  Component Value Date   WBC 8.7 10/07/2023   HGB 13.6 10/07/2023   HCT 43.2 10/07/2023   MCV 104.6 (H) 10/07/2023   PLT 282 10/07/2023    Radiographic Findings: No results found.  Impression: The encounter diagnosis was Lung nodule [R91.1].   Slowly enlarging RUL pulmonary nodule, with features suggestive of non-small cell lung cancer (non-biopsy proven)     The patient is doing well overall today.  She tolerated the radiation  treatment well.   Plan:  Follow up chest CT in 3 months. Routine office visit 1-2 days after scan to review results. We will continue to monitor the incidental left lung nodules visualized on her cardiac scoring CT. Patient knows to call with any questions or concerns in the meantime.    20 minutes of total time was spent for this patient encounter, including preparation, face-to-face counseling with the patient and coordination of care, physical exam, and documentation of the encounter. ____________________________________   Joyice Faster, PA-C   This document serves as a record of services personally performed by Bryan Lemma, PA-C. It was created on his behalf by Neena Rhymes, a trained medical scribe. The creation of this record is based on the scribe's personal observations and the provider's statements  to them. This document has been checked and approved by the attending provider.

## 2023-11-02 NOTE — Progress Notes (Signed)
Holly Benson is here today for follow up post radiation to the lung.  Lung Side: Right  Does the patient complain of any of the following: Pain:Denies Shortness of breath w/wo exertion: Denies Cough: Denies Hemoptysis: Denies Pain with swallowing: Denies Swallowing/choking concerns: Denies Appetite: Good Energy Level: Low Post radiation skin Changes: Denies    BP (!) 135/55 (BP Location: Left Arm, Patient Position: Sitting, Cuff Size: Normal)   Pulse 74   Temp (!) 97 F (36.1 C)   Resp 20   Ht 5\' 2"  (1.575 m)   Wt 142 lb 9.6 oz (64.7 kg)   SpO2 95%   BMI 26.08 kg/m

## 2023-11-02 NOTE — Progress Notes (Signed)
  Radiation Oncology         [phone]) [phone] ________________________________  Name: Holly Benson MRN: [id number]  Date: 11/02/2023  DOB: Oct 30, 1933  End of Treatment Note  Diagnosis: The encounter diagnosis was Lung nodule [R91.1].   Slowly enlarging RUL pulmonary nodule, with features suggestive of non-small cell lung cancer (non-biopsy proven)     Indication for treatment: Curative        Radiation treatment dates: First Treatment Date: 2023-09-22 - Last Treatment Date: 2023-09-29  Site/Dose/Technique/Mode:   Site: Lung, Right Technique: SBRT/SRT-IMRT Mode: Photon Dose Per Fraction: 18 Gy Prescribed Dose (Delivered / Prescribed): 54 Gy / 54 Gy Prescribed Fxs (Delivered / Prescribed): 3 / 3  Narrative: The patient tolerated radiation treatment relatively well. During her final weekly treatment check on 09/29/23, the patient endorsed fatigue and SOB. She was also noted to have an elevated heart rate which her cardiologist noted to be related to her radiation therapy.   Plan: The patient has completed radiation treatment. The patient will return to radiation oncology clinic for routine followup in one month. I advised them to call or return sooner if they have any questions or concerns related to their recovery or treatment.  -----------------------------------  Billie Lade, PhD, MD  This document serves as a record of services personally performed by Antony Blackbird, MD. It was created on his behalf by Neena Rhymes, a trained medical scribe. The creation of this record is based on the scribe's personal observations and the provider's statements to them. This document has been checked and approved by the attending provider.

## 2023-11-05 ENCOUNTER — Ambulatory Visit (HOSPITAL_BASED_OUTPATIENT_CLINIC_OR_DEPARTMENT_OTHER)
Admission: RE | Admit: 2023-11-05 | Discharge: 2023-11-05 | Disposition: A | Discharge status: Home / Self Care | Payer: Medicare Other | Source: Ambulatory Visit | Attending: Internal Medicine | Admitting: Internal Medicine

## 2023-11-05 ENCOUNTER — Encounter: Payer: Self-pay | Admitting: Internal Medicine

## 2023-11-05 DIAGNOSIS — Z780 Asymptomatic menopausal state: Secondary | ICD-10-CM | POA: Diagnosis not present

## 2023-11-05 DIAGNOSIS — R29890 Loss of height: Secondary | ICD-10-CM | POA: Diagnosis not present

## 2023-11-06 NOTE — Progress Notes (Signed)
 89 y.o. G44P3003 Widowed Caucasian female here for a NEWGYN pt appt for prolapse. Referred by PCP for cystocele with prolapse. Pt c/o intermittent vaginal pressure. Some days can feel more than others.  Symptoms for months. States she is managing ok, and had concerns for a possible tumor as the cause for the vaginal bulge.   She had a vaginal hysterectomy for uterine prolapse 20 years ago.  She thinks her ovaries were also removed.   Has urinary incontinence.  Difficulty to get to the bathroom on time in the am.   Up at night sometimes to void.  Does take Lasix  and voids during the day every 2 - 3 hours.   Not usually leaking with a cough, laugh or sneeze.   Remote hx of hematuria in remote past, which resolved.  She had a ureteroscopy which was normal.   Has constipation and can have accidental leakage of stool if she takes a laxative.   She occasionally does splinting around the rectum to relieve her constipation.    PCP: [name], [name], MD   No LMP recorded. Patient has had a hysterectomy.       Sexually active: No.  The current method of family planning is status post hysterectomy.    Menopausal hormone therapy: none? Exercising: Yes.     Gym, 2-3x weekly.  Smoker: no, former  OB History     Gravida  3   Para  3   Term  3   Preterm      AB      Living  3      SAB      IAB      Ectopic      Multiple      Live Births              HEALTH MAINTENANCE: Last 2 paps: prior to hyst History of abnormal Pap or positive HPV:  no Mammogram: few years ago possibly at [facility]  Colonoscopy: 1.53yrs ago prior to hernia surgery. Bone Density: 11/05/2023  Result osteoporosis (T-score -3.0 @ [facility])   Immunization History  Administered Date(s) Administered   Influenza, High Dose Seasonal PF 08/03/2017, 07/15/2018, 06/28/2019, 08/03/2020   Influenza,inj,Quad PF,6+ Mos 08/02/2018   Influenza-Unspecified 08/27/2021, 08/01/2022, 08/07/2023    PFIZER(Purple Top)SARS-COV-2 Vaccination 12/08/2019, 01/02/2020, 10/02/2020, 03/04/2021, 09/12/2021   PNEUMOCOCCAL CONJUGATE-20 05/28/2023   Pneumococcal Polysaccharide-23 01/27/2020   Td 02/22/2009   Tdap 05/28/2023   Yellow Fever 02/22/2009   Zoster Recombinant(Shingrix) 12/25/2017, 03/04/2021      reports that she quit smoking about 32 years ago. Her smoking use included cigarettes. She started smoking about 69 years ago. She has a 55.5 pack-year smoking history. She has never used smokeless tobacco. She reports current alcohol  use of about 7.0 standard drinks of alcohol  per week. She reports that she does not use drugs.  Past Medical History:  Diagnosis Date   Arrhythmia    Arthritis    Atrial fibrillation (HCC)    Constipation    chronic   COPD (chronic obstructive pulmonary disease) (HCC)    History of radiation therapy    Right lung- 09/22/23-09/29/23- Dr. Lynwood Nasuti   Hyperlipidemia    Hypertension    pulmonary   OSA on CPAP    Osteoarthritis    Osteoporosis    Peripheral neuropathy    Pulmonary hypertension (HCC)    RLS (restless legs syndrome)    Sleep apnea     Past Surgical History:  Procedure Laterality  Date   ABDOMINAL AORTOGRAM W/LOWER EXTREMITY N/A 03/21/2020   Procedure: ABDOMINAL AORTOGRAM W/ Bilateral LOWER EXTREMITY [place];  Surgeon: Darron Deatrice LABOR, MD;  Location: [facility];  Service: Cardiovascular;  Laterality: N/A;   ABDOMINAL HYSTERECTOMY Bilateral    arm surgery Right    Broken arm and has a plate in it   CARDIOVERSION N/A 03/12/2023   Procedure: CARDIOVERSION;  Surgeon: Shlomo Wilbert SAUNDERS, MD;  Location: [facility];  Service: Cardiovascular;  Laterality: N/A;   CATARACT EXTRACTION Bilateral    COLONOSCOPY     More than 10 years ago [facility]   HERNIA REPAIR  08/2022   KNEE ARTHROSCOPY Right    PERIPHERAL VASCULAR INTERVENTION Bilateral 03/21/2020   Procedure: PERIPHERAL VASCULAR INTERVENTION;  Surgeon: Darron Deatrice LABOR, MD;  Location: [facility];  Service: Cardiovascular;  Laterality: Bilateral;  external iliac   TONSILLECTOMY     ventral herniorrhaphy  2023   mesh used    Current Outpatient Medications  Medication Sig Dispense Refill   acetaminophen  (TYLENOL ) 325 MG tablet Take 650 mg by mouth as needed for moderate pain or headache.     albuterol  (VENTOLIN  HFA) 108 (90 Base) MCG/ACT inhaler INHALE TWO PUFFS BY MOUTH EVERY 6 HOURS AS NEEDED FOR WHEEZING OR FOR SHORTNESS OF BREATH 18 g 6   apixaban  (ELIQUIS ) 5 MG TABS tablet Take 1 tablet (5 mg total) by mouth 2 (two) times daily. 20 tablet 0   bisoprolol  (ZEBETA ) 10 MG tablet TAKE 1 TABLET BY MOUTH AT BEDTIME 90 tablet 3   Cholecalciferol (VITAMIN D3 MAXIMUM STRENGTH) 125 MCG (5000 UT) capsule Take 5,000 Units by mouth daily.     clonazePAM  (KLONOPIN ) 0.5 MG tablet Take 0.5 tablets (0.25 mg total) by mouth at bedtime as needed for anxiety. (Patient taking differently: Take 0.25 mg by mouth at bedtime as needed (Restless leg).) 30 tablet 2   docusate sodium  (COLACE) 100 MG capsule Take 300 mg by mouth at bedtime.     dofetilide  (TIKOSYN ) 250 MCG capsule TAKE 1 CAPSULE BY MOUTH 2 TIMES A DAY. 180 capsule 2   furosemide  (LASIX ) 20 MG tablet TAKE 1 TABLET BY MOUTH ON MONDAY, WEDNESDAY, AND FRIDAY. TAKE 2 TABLETS BY MOUTH ON ALL OTHER DAYS. 36 tablet 1   irbesartan  (AVAPRO ) 300 MG tablet Take 1 tablet (300 mg total) by mouth daily. 90 tablet 1   Misc Natural Products (COLON CLEANSE) CAPS Take 4 capsules by mouth as needed (constipation).     Multiple Vitamins-Minerals (PRESERVISION AREDS 2) CAPS Take 1 tablet by mouth 2 (two) times daily.     polyethylene glycol (MIRALAX  / GLYCOLAX ) 17 g packet Take 17 g by mouth at bedtime.     PRESCRIPTION MEDICATION at bedtime as needed (Sometimes). CPAP     rOPINIRole  (REQUIP ) 0.5 MG tablet One in AM and one after 3 Pm and one after dinner 8 Pm (Patient taking differently: Take 0.5 mg by mouth in the morning, at  noon, in the evening, and at bedtime.) 90 tablet 5   rosuvastatin  (CRESTOR ) 20 MG tablet Take 1 tablet (20 mg total) by mouth daily. 90 tablet 3   trimethoprim -polymyxin b  (POLYTRIM ) ophthalmic solution Place 1 drop into both eyes every 4 (four) hours. 10 mL 0   amLODipine  (NORVASC ) 5 MG tablet Take 1 tablet (5 mg total) by mouth daily. 30 tablet 7   No current facility-administered medications for this visit.   Facility-Administered Medications Ordered in Other Visits  Medication Dose Route Frequency Provider Last Rate Last Admin   technetium tetrofosmin  (TC-MYOVIEW ) injection 31.9 millicurie  31.9 millicurie Intravenous Once PRN Raford Riggs, MD        ALLERGIES: Lovenox [enoxaparin sodium] and Metoprolol  succinate [metoprolol ]  Family History  Problem Relation Age of Onset   Suicidality Father    Stroke Maternal Grandfather    Hypertension Sister    Colon cancer Neg Hx    Esophageal cancer Neg Hx    Pancreatic cancer Neg Hx    Stomach cancer Neg Hx    Liver disease Neg Hx     Review of Systems  All other systems reviewed and are negative.   PHYSICAL EXAM:  BP 122/82   Pulse 62   Ht 5' 2.75 (1.594 m)   Wt 142 lb (64.4 kg)   SpO2 97%   BMI 25.36 kg/m     General appearance: alert, cooperative and appears stated age Head: normocephalic, without obvious abnormality, atraumatic Lungs: clear to auscultation bilaterally Heart: regular rate and rhythm.  Occasional PAC versus PVC.  (Patient states she thinks she is in atrial fibrillation and no shortness of breath or chest pain.) Abdomen: soft, non-tender; no masses, no organomegaly Extremities: extremities normal, atraumatic, no cyanosis or edema Neurologic: grossly normal  Pelvic: External genitalia:  no lesions              No abnormal inguinal nodes palpated.              Urethra:  normal appearing urethra with no masses, tenderness or lesions              Bartholins and Skenes: normal                 Vagina:  normal appearing vagina with normal color and discharge, no lesions.  First degree cystocele and second degree rectocele.  Good support of the vaginal apex.                Cervix:  absent          Bimanual Exam:  Uterus:  absent              Adnexa: no mass, fullness, tenderness              Rectal exam: Yes.  .  Confirms.              Anus:  normal sphincter tone, no lesions  Chaperone was present for exam:  Waddell MATSU, CMA  ASSESSMENT:  Status post vaginal hysterectomy for uterine prolapse with possible bilateral salpingo-oophorectomy.  Cystocele and rectocele.  Atrial fibrillation.  On Eliquis .  Bilateral iliac artery stents.   PLAN:   We discussed pelvic organ prolapse - risk factors, symptoms, and treatment options.  Observational management, pessary care, pelvic floor therapy, and surgical correction of prolapse discussed.  Written information provided.  Patient opts for observational management and a recheck in about 7 months.  Follow up:  7 months.    33 min  total time was spent for this patient encounter, including preparation, face-to-face counseling with the patient, coordination of care, and documentation of the encounter in addition to doing the breast and pelvic exam.

## 2023-11-09 ENCOUNTER — Ambulatory Visit (INDEPENDENT_AMBULATORY_CARE_PROVIDER_SITE_OTHER): Payer: Medicare Other | Admitting: Obstetrics and Gynecology

## 2023-11-09 ENCOUNTER — Encounter: Payer: Self-pay | Admitting: Obstetrics and Gynecology

## 2023-11-09 VITALS — BP 122/82 | HR 62 | Ht 62.75 in | Wt 142.0 lb

## 2023-11-09 DIAGNOSIS — N816 Rectocele: Secondary | ICD-10-CM

## 2023-11-09 DIAGNOSIS — N8110 Cystocele, unspecified: Secondary | ICD-10-CM | POA: Diagnosis not present

## 2023-11-09 NOTE — Patient Instructions (Signed)
Pelvic Organ Prolapse Pelvic organ prolapse is a condition in women that involves the stretching, bulging, or dropping of pelvic organs into an abnormal position, past the opening of the vagina. It happens when the muscles and tissues that surround and support pelvic structures become weak or stretched. Pelvic organ prolapse can involve the: Vagina (vaginal prolapse). Uterus (uterine prolapse). Bladder (cystocele). Rectum (rectocele). Intestines (enterocele). When organs other than the vagina are involved, they often bulge into the vagina or protrude from the vagina, depending on how severe the prolapse is. What are the causes? This condition may be caused by: Pregnancy, labor, and childbirth. Past pelvic surgery. Lower levels of the hormone estrogen due to menopause. Consistently lifting more than 50 lb (23 kg). Obesity. Long-term difficulty passing stool (chronic constipation). Long-term, or chronic, cough. Fluid buildup in the abdomen due to certain conditions. What are the signs or symptoms? Symptoms of this condition include: Leaking a little urine (loss of bladder control) when you cough, sneeze, strain, and exercise (stress incontinence). This may be worse immediately after childbirth. It may gradually improve over time. Feeling pressure in your pelvis or vagina. This pressure may increase when you cough or when you are passing stool. A bulge that protrudes from the opening of your vagina. Difficulty passing urine or stool. Pain in your lower back. Pain or discomfort during sex, or decreased interest in sex. Repeated bladder infections (urinary tract infections). Difficulty inserting a tampon. In some people, this condition causes no symptoms. How is this diagnosed? This condition may be diagnosed based on a vaginal and rectal exam. During the exam, you may be asked to cough and strain while you are lying down, sitting, and standing up. Your health care provider will determine if  other tests are required, such as bladder function tests. How is this treated? Treatment for this condition may depend on your symptoms. Treatment may include: Lifestyle changes, such as drinking plenty of fluids and eating foods that are high in fiber. Emptying your bladder at scheduled times (bladder training therapy). This can help reduce or avoid urinary incontinence. Estrogen. This may help mild prolapse by increasing the strength and tone of pelvic floor muscles. Kegel exercises. These may help mild cases of prolapse by strengthening and tightening the muscles of the pelvic floor. A soft, flexible device that helps support the vaginal walls and keep pelvic organs in place (pessary). This is inserted into your vagina by your health care provider. Surgery. This is often the only form of treatment for severe prolapse. Follow these instructions at home: Eating and drinking Avoid drinking beverages that contain caffeine or alcohol. Increase your intake of high-fiber foods to decrease constipation and straining during bowel movements. Activity Lose weight if recommended by your health care provider. Avoid heavy lifting and straining with exercise and work. Do not hold your breath when you perform mild to moderate lifting and exercise activities. Limit your activities as directed by your health care provider. Do Kegel exercises as directed by your health care provider. To do this: Squeeze your pelvic floor muscles tight. You should feel a tight lift in your rectal area and a tightness in your vaginal area. Keep your stomach, buttocks, and legs relaxed. Hold the muscles tight for up to 10 seconds. Then relax your muscles. Repeat this exercise 50 times a day, or as much as told by your health care provider. Continue to do this exercise for at least 4-6 weeks, or for as long as told by your health care provider.  General instructions Take over-the-counter and prescription medicines only as told by  your health care provider. Wear a sanitary pad or adult diapers if you have urinary incontinence. If you have a pessary, take care of it as told by your health care provider. Keep all follow-up visits. This is important. Contact a health care provider if you: Have symptoms that interfere with your daily activities or sex life. Need medicine to help with the discomfort. Notice bleeding from your vagina that is not related to your menstrual period. Have a fever. Have pain or bleeding when you urinate. Have bleeding when you pass stool. Pass urine when you have sex. Have chronic constipation. Have a pessary that falls out. Have a foul-smelling vaginal discharge. Have an unusual, low pain in your abdomen. Get help right away if you: Cannot pass urine. Summary Pelvic organ prolapse is the stretching, bulging, or dropping of pelvic organs into an abnormal position. It happens when the muscles and tissues that surround and support pelvic structures become weak or stretched. When organs other than the vagina are involved, they often bulge into the vagina or protrude from it, depending on how severe the prolapse is. In most cases, this condition needs to be treated only if it produces symptoms. Treatment may include lifestyle changes, estrogen, Kegel exercises, pessary insertion, or surgery. Avoid heavy lifting and straining with exercise and work. Do not hold your breath when you perform mild to moderate lifting and exercise activities. Limit your activities as directed by your health care provider. This information is not intended to replace advice given to you by your health care provider. Make sure you discuss any questions you have with your health care provider. Document Revised: 04/16/2020 Document Reviewed: 04/16/2020 Elsevier Patient Education  2024 [employer].

## 2023-11-10 ENCOUNTER — Encounter: Payer: Self-pay | Admitting: Obstetrics and Gynecology

## 2023-11-11 ENCOUNTER — Encounter: Payer: Self-pay | Admitting: Cardiology

## 2023-11-17 ENCOUNTER — Encounter: Payer: Self-pay | Admitting: Internal Medicine

## 2023-11-17 ENCOUNTER — Ambulatory Visit (INDEPENDENT_AMBULATORY_CARE_PROVIDER_SITE_OTHER): Payer: Medicare Other | Admitting: Internal Medicine

## 2023-11-17 VITALS — BP 120/68 | HR 58 | Temp 98.2°F | Wt 144.7 lb

## 2023-11-17 DIAGNOSIS — M810 Age-related osteoporosis without current pathological fracture: Secondary | ICD-10-CM | POA: Diagnosis not present

## 2023-11-17 DIAGNOSIS — J209 Acute bronchitis, unspecified: Secondary | ICD-10-CM | POA: Diagnosis not present

## 2023-11-17 MED ORDER — AZITHROMYCIN 250 MG PO TABS: ORAL_TABLET | ORAL | 0 refills | Status: DC

## 2023-11-17 MED ORDER — BENZONATATE 100 MG PO CAPS: 100.0000 mg | ORAL_CAPSULE | Freq: Two times a day (BID) | ORAL | 0 refills | Status: DC | PRN

## 2023-11-17 NOTE — Progress Notes (Signed)
 Established Patient Office Visit     CC/Reason for Visit: URI symptoms, discuss recent bone density test  HPI: Holly Benson is a 89 y.o. female who is coming in today for the above mentioned reasons.  She was recently diagnosed with osteoporosis via bone density.  She would like to discuss these results and potential management.  For the past 5 days she has also been having a cough productive of clear to white-colored sputum, postnasal drip, sinus pressure and fatigue.   Past Medical/Surgical History: Past Medical History:  Diagnosis Date   Arrhythmia    Arthritis    Atrial fibrillation (HCC)    Constipation    chronic   COPD (chronic obstructive pulmonary disease) (HCC)    History of radiation therapy    Right lung- 09/22/23-09/29/23- Dr. Lynwood Nasuti   Hyperlipidemia    Hypertension    pulmonary   OSA on CPAP    Osteoarthritis    Osteoporosis    Peripheral neuropathy    Pulmonary hypertension (HCC)    RLS (restless legs syndrome)    Sleep apnea     Past Surgical History:  Procedure Laterality Date   ABDOMINAL AORTOGRAM W/LOWER EXTREMITY N/A 03/21/2020   Procedure: ABDOMINAL AORTOGRAM W/ Bilateral LOWER EXTREMITY [place];  Surgeon: Darron Deatrice LABOR, MD;  Location: [facility];  Service: Cardiovascular;  Laterality: N/A;   ABDOMINAL HYSTERECTOMY Bilateral    arm surgery Right    Broken arm and has a plate in it   CARDIOVERSION N/A 03/12/2023   Procedure: CARDIOVERSION;  Surgeon: Shlomo Wilbert SAUNDERS, MD;  Location: [facility];  Service: Cardiovascular;  Laterality: N/A;   CATARACT EXTRACTION Bilateral    COLONOSCOPY     More than 10 years ago In [facility]   HERNIA REPAIR  08/2022   KNEE ARTHROSCOPY Right    PERIPHERAL VASCULAR INTERVENTION Bilateral 03/21/2020   Procedure: PERIPHERAL VASCULAR INTERVENTION;  Surgeon: Darron Deatrice LABOR, MD;  Location: [facility];  Service: Cardiovascular;  Laterality: Bilateral;  external iliac    TONSILLECTOMY     ventral herniorrhaphy  2023   mesh used    Social History:  reports that she quit smoking about 32 years ago. Her smoking use included cigarettes. She started smoking about 69 years ago. She has a 55.5 pack-year smoking history. She has never used smokeless tobacco. She reports current alcohol  use of about 7.0 standard drinks of alcohol  per week. She reports that she does not use drugs.  Allergies: Allergies  Allergen Reactions   Lovenox [Enoxaparin Sodium] Hives and Rash    PT STATES SHE BROKE OUT IN A RASH HEAD TO TOE AND LASTED ABOUT 3 WEEKS    Metoprolol  Succinate [Metoprolol ] Other (See Comments)    Muscle aches, hand pain, and tingling    Family History:  Family History  Problem Relation Age of Onset   Suicidality Father    Stroke Maternal Grandfather    Hypertension Sister    Colon cancer Neg Hx    Esophageal cancer Neg Hx    Pancreatic cancer Neg Hx    Stomach cancer Neg Hx    Liver disease Neg Hx      Current Outpatient Medications:    acetaminophen  (TYLENOL ) 325 MG tablet, Take 650 mg by mouth as needed for moderate pain or headache., Disp: , Rfl:    albuterol  (VENTOLIN  HFA) 108 (90 Base) MCG/ACT inhaler, INHALE TWO PUFFS BY MOUTH EVERY 6 HOURS AS NEEDED FOR WHEEZING  OR FOR SHORTNESS OF BREATH, Disp: 18 g, Rfl: 6   apixaban  (ELIQUIS ) 5 MG TABS tablet, Take 1 tablet (5 mg total) by mouth 2 (two) times daily., Disp: 20 tablet, Rfl: 0   azithromycin  (ZITHROMAX ) 250 MG tablet, Take 2 tablets on day 1, then 1 tablet daily on days 2 through 5, Disp: 6 tablet, Rfl: 0   benzonatate  (TESSALON ) 100 MG capsule, Take 1 capsule (100 mg total) by mouth 2 (two) times daily as needed for cough., Disp: 20 capsule, Rfl: 0   bisoprolol  (ZEBETA ) 10 MG tablet, TAKE 1 TABLET BY MOUTH AT BEDTIME, Disp: 90 tablet, Rfl: 3   Cholecalciferol (VITAMIN D3 MAXIMUM STRENGTH) 125 MCG (5000 UT) capsule, Take 5,000 Units by mouth daily., Disp: , Rfl:    clonazePAM  (KLONOPIN ) 0.5 MG  tablet, Take 0.5 tablets (0.25 mg total) by mouth at bedtime as needed for anxiety. (Patient taking differently: Take 0.25 mg by mouth at bedtime as needed (Restless leg).), Disp: 30 tablet, Rfl: 2   docusate sodium  (COLACE) 100 MG capsule, Take 300 mg by mouth at bedtime., Disp: , Rfl:    dofetilide  (TIKOSYN ) 250 MCG capsule, TAKE 1 CAPSULE BY MOUTH 2 TIMES A DAY., Disp: 180 capsule, Rfl: 2   furosemide  (LASIX ) 20 MG tablet, TAKE 1 TABLET BY MOUTH ON MONDAY, WEDNESDAY, AND FRIDAY. TAKE 2 TABLETS BY MOUTH ON ALL OTHER DAYS., Disp: 36 tablet, Rfl: 1   irbesartan  (AVAPRO ) 300 MG tablet, Take 1 tablet (300 mg total) by mouth daily., Disp: 90 tablet, Rfl: 1   Misc Natural Products (COLON CLEANSE) CAPS, Take 4 capsules by mouth as needed (constipation)., Disp: , Rfl:    Multiple Vitamins-Minerals (PRESERVISION AREDS 2) CAPS, Take 1 tablet by mouth 2 (two) times daily., Disp: , Rfl:    polyethylene glycol (MIRALAX  / GLYCOLAX ) 17 g packet, Take 17 g by mouth at bedtime., Disp: , Rfl:    PRESCRIPTION MEDICATION, at bedtime as needed (Sometimes). CPAP, Disp: , Rfl:    rOPINIRole  (REQUIP ) 0.5 MG tablet, One in AM and one after 3 Pm and one after dinner 8 Pm (Patient taking differently: Take 0.5 mg by mouth in the morning, at noon, in the evening, and at bedtime.), Disp: 90 tablet, Rfl: 5   rosuvastatin  (CRESTOR ) 20 MG tablet, Take 1 tablet (20 mg total) by mouth daily., Disp: 90 tablet, Rfl: 3   trimethoprim -polymyxin b  (POLYTRIM ) ophthalmic solution, Place 1 drop into both eyes every 4 (four) hours., Disp: 10 mL, Rfl: 0   amLODipine  (NORVASC ) 5 MG tablet, Take 1 tablet (5 mg total) by mouth daily., Disp: 30 tablet, Rfl: 7 No current facility-administered medications for this visit.  Facility-Administered Medications Ordered in Other Visits:    technetium tetrofosmin  (TC-MYOVIEW ) injection 31.9 millicurie, 31.9 millicurie, Intravenous, Once PRN, Raford Riggs, MD  Review of Systems:  Negative unless  indicated in HPI.   Physical Exam: Vitals:   11/17/23 1022  BP: 120/68  Pulse: (!) 58  Temp: 98.2 F (36.8 C)  TempSrc: Oral  SpO2: 93%  Weight: 144 lb 11.2 oz (65.6 kg)    Body mass index is 25.84 kg/m.   Physical Exam Vitals reviewed.  Constitutional:      Appearance: Normal appearance.  HENT:     Right Ear: Tympanic membrane, ear canal and external ear normal.     Left Ear: Tympanic membrane, ear canal and external ear normal.     Mouth/Throat:     Mouth: Mucous membranes are moist.  Pharynx: Oropharynx is clear.  Eyes:     Conjunctiva/sclera: Conjunctivae normal.     Pupils: Pupils are equal, round, and reactive to light.  Cardiovascular:     Rate and Rhythm: Normal rate and regular rhythm.  Pulmonary:     Effort: Pulmonary effort is normal.     Breath sounds: Examination of the right-upper field reveals rhonchi. Examination of the left-upper field reveals rhonchi. Examination of the right-middle field reveals rhonchi. Examination of the left-middle field reveals rhonchi. Examination of the right-lower field reveals rhonchi. Examination of the left-lower field reveals rhonchi. Rhonchi present.  Neurological:     Mental Status: She is alert.      Impression and Plan:  Age-related osteoporosis without current pathological fracture  Acute bronchitis, unspecified organism -     Benzonatate ; Take 1 capsule (100 mg total) by mouth 2 (two) times daily as needed for cough.  Dispense: 20 capsule; Refill: 0 -     Azithromycin ; Take 2 tablets on day 1, then 1 tablet daily on days 2 through 5  Dispense: 6 tablet; Refill: 0   -Given her immune suppressed state and lung auscultation today, I believe a treatment with a Z-Pak and Tessalon  Perles is indicated. -After discussing different available treatment for osteoporosis she would like to think about it and will get back in touch with us  if she decides to start medication.  Time spent:30 minutes reviewing chart,  interviewing and examining patient and formulating plan of care.     Tully [name], MD [facility]

## 2023-11-18 ENCOUNTER — Other Ambulatory Visit: Payer: Self-pay | Admitting: Internal Medicine

## 2023-11-19 ENCOUNTER — Other Ambulatory Visit: Payer: Self-pay | Admitting: General Practice

## 2023-11-19 DIAGNOSIS — I480 Paroxysmal atrial fibrillation: Secondary | ICD-10-CM

## 2023-11-20 ENCOUNTER — Telehealth: Payer: Self-pay | Admitting: Nurse Practitioner

## 2023-11-20 ENCOUNTER — Inpatient Hospital Stay (HOSPITAL_BASED_OUTPATIENT_CLINIC_OR_DEPARTMENT_OTHER)
Admission: EM | Admit: 2023-11-20 | Discharge: 2023-11-24 | DRG: 190 | Disposition: A | Discharge status: Home / Self Care | Payer: Medicare Other | Attending: Internal Medicine | Admitting: Internal Medicine

## 2023-11-20 ENCOUNTER — Other Ambulatory Visit: Payer: Self-pay

## 2023-11-20 ENCOUNTER — Encounter (HOSPITAL_BASED_OUTPATIENT_CLINIC_OR_DEPARTMENT_OTHER): Payer: Self-pay

## 2023-11-20 ENCOUNTER — Encounter: Payer: Self-pay | Admitting: Internal Medicine

## 2023-11-20 ENCOUNTER — Emergency Department (HOSPITAL_BASED_OUTPATIENT_CLINIC_OR_DEPARTMENT_OTHER): Payer: Medicare Other

## 2023-11-20 DIAGNOSIS — I480 Paroxysmal atrial fibrillation: Secondary | ICD-10-CM | POA: Diagnosis not present

## 2023-11-20 DIAGNOSIS — Z87891 Personal history of nicotine dependence: Secondary | ICD-10-CM | POA: Diagnosis not present

## 2023-11-20 DIAGNOSIS — R0902 Hypoxemia: Secondary | ICD-10-CM

## 2023-11-20 DIAGNOSIS — J90 Pleural effusion, not elsewhere classified: Secondary | ICD-10-CM

## 2023-11-20 DIAGNOSIS — I509 Heart failure, unspecified: Secondary | ICD-10-CM | POA: Diagnosis not present

## 2023-11-20 DIAGNOSIS — E785 Hyperlipidemia, unspecified: Secondary | ICD-10-CM | POA: Diagnosis present

## 2023-11-20 DIAGNOSIS — Z7901 Long term (current) use of anticoagulants: Secondary | ICD-10-CM

## 2023-11-20 DIAGNOSIS — Z8679 Personal history of other diseases of the circulatory system: Secondary | ICD-10-CM | POA: Diagnosis not present

## 2023-11-20 DIAGNOSIS — I5033 Acute on chronic diastolic (congestive) heart failure: Secondary | ICD-10-CM | POA: Diagnosis present

## 2023-11-20 DIAGNOSIS — J441 Chronic obstructive pulmonary disease with (acute) exacerbation: Secondary | ICD-10-CM | POA: Diagnosis not present

## 2023-11-20 DIAGNOSIS — I10 Essential (primary) hypertension: Secondary | ICD-10-CM | POA: Diagnosis present

## 2023-11-20 DIAGNOSIS — Z1152 Encounter for screening for COVID-19: Secondary | ICD-10-CM | POA: Diagnosis not present

## 2023-11-20 DIAGNOSIS — Z888 Allergy status to other drugs, medicaments and biological substances status: Secondary | ICD-10-CM

## 2023-11-20 DIAGNOSIS — I110 Hypertensive heart disease with heart failure: Secondary | ICD-10-CM | POA: Diagnosis present

## 2023-11-20 DIAGNOSIS — Z8249 Family history of ischemic heart disease and other diseases of the circulatory system: Secondary | ICD-10-CM | POA: Diagnosis not present

## 2023-11-20 DIAGNOSIS — J9601 Acute respiratory failure with hypoxia: Secondary | ICD-10-CM | POA: Diagnosis not present

## 2023-11-20 DIAGNOSIS — Z823 Family history of stroke: Secondary | ICD-10-CM

## 2023-11-20 DIAGNOSIS — Z79899 Other long term (current) drug therapy: Secondary | ICD-10-CM | POA: Diagnosis not present

## 2023-11-20 DIAGNOSIS — I491 Atrial premature depolarization: Secondary | ICD-10-CM | POA: Diagnosis present

## 2023-11-20 DIAGNOSIS — G2581 Restless legs syndrome: Secondary | ICD-10-CM | POA: Diagnosis present

## 2023-11-20 DIAGNOSIS — M810 Age-related osteoporosis without current pathological fracture: Secondary | ICD-10-CM | POA: Diagnosis present

## 2023-11-20 DIAGNOSIS — Z85118 Personal history of other malignant neoplasm of bronchus and lung: Secondary | ICD-10-CM | POA: Diagnosis not present

## 2023-11-20 DIAGNOSIS — Z90710 Acquired absence of both cervix and uterus: Secondary | ICD-10-CM | POA: Diagnosis not present

## 2023-11-20 DIAGNOSIS — Z923 Personal history of irradiation: Secondary | ICD-10-CM

## 2023-11-20 DIAGNOSIS — I2510 Atherosclerotic heart disease of native coronary artery without angina pectoris: Secondary | ICD-10-CM | POA: Diagnosis not present

## 2023-11-20 DIAGNOSIS — I080 Rheumatic disorders of both mitral and aortic valves: Secondary | ICD-10-CM | POA: Diagnosis present

## 2023-11-20 DIAGNOSIS — R0689 Other abnormalities of breathing: Secondary | ICD-10-CM | POA: Diagnosis not present

## 2023-11-20 DIAGNOSIS — F411 Generalized anxiety disorder: Secondary | ICD-10-CM | POA: Diagnosis present

## 2023-11-20 DIAGNOSIS — I5043 Acute on chronic combined systolic (congestive) and diastolic (congestive) heart failure: Secondary | ICD-10-CM | POA: Diagnosis not present

## 2023-11-20 DIAGNOSIS — I272 Pulmonary hypertension, unspecified: Secondary | ICD-10-CM | POA: Diagnosis not present

## 2023-11-20 DIAGNOSIS — I420 Dilated cardiomyopathy: Secondary | ICD-10-CM | POA: Diagnosis present

## 2023-11-20 DIAGNOSIS — C3490 Malignant neoplasm of unspecified part of unspecified bronchus or lung: Secondary | ICD-10-CM | POA: Diagnosis present

## 2023-11-20 DIAGNOSIS — I5031 Acute diastolic (congestive) heart failure: Secondary | ICD-10-CM | POA: Diagnosis not present

## 2023-11-20 DIAGNOSIS — J449 Chronic obstructive pulmonary disease, unspecified: Secondary | ICD-10-CM | POA: Diagnosis present

## 2023-11-20 DIAGNOSIS — I5032 Chronic diastolic (congestive) heart failure: Secondary | ICD-10-CM | POA: Insufficient documentation

## 2023-11-20 DIAGNOSIS — R918 Other nonspecific abnormal finding of lung field: Secondary | ICD-10-CM | POA: Diagnosis not present

## 2023-11-20 DIAGNOSIS — R911 Solitary pulmonary nodule: Secondary | ICD-10-CM | POA: Diagnosis not present

## 2023-11-20 LAB — BASIC METABOLIC PANEL
Anion gap: 9 (ref 5–15)
BUN: 18 mg/dL (ref 8–23)
CO2: 25 mmol/L (ref 22–32)
Calcium: 9.6 mg/dL (ref 8.9–10.3)
Chloride: 102 mmol/L (ref 98–111)
Creatinine, Ser: 0.71 mg/dL (ref 0.44–1.00)
GFR, Estimated: >60 mL/min (ref >60)
Glucose, Bld: 100 mg/dL — ABNORMAL HIGH (ref 70–99)
Potassium: 4.5 mmol/L (ref 3.5–5.1)
Sodium: 136 mmol/L (ref 135–145)

## 2023-11-20 LAB — CBC WITH DIFFERENTIAL/PLATELET
Abs Immature Granulocytes: 0.03 10*3/uL (ref 0.00–0.07)
Basophils Absolute: 0 10*3/uL (ref 0.0–0.1)
Basophils Relative: 1 %
Eosinophils Absolute: 0.1 10*3/uL (ref 0.0–0.5)
Eosinophils Relative: 2 %
HCT: 39.4 % (ref 36.0–46.0)
Hemoglobin: 13.1 g/dL (ref 12.0–15.0)
Immature Granulocytes: 0 %
Lymphocytes Relative: 24 %
Lymphs Abs: 2 10*3/uL (ref 0.7–4.0)
MCH: 32.7 pg (ref 26.0–34.0)
MCHC: 33.2 g/dL (ref 30.0–36.0)
MCV: 98.3 fL (ref 80.0–100.0)
Monocytes Absolute: 0.9 10*3/uL (ref 0.1–1.0)
Monocytes Relative: 12 %
Neutro Abs: 5 10*3/uL (ref 1.7–7.7)
Neutrophils Relative %: 61 %
Platelets: 284 10*3/uL (ref 150–400)
RBC: 4.01 MIL/uL (ref 3.87–5.11)
RDW: 14.6 % (ref 11.5–15.5)
WBC: 8.2 10*3/uL (ref 4.0–10.5)
nRBC: 0 % (ref 0.0–0.2)

## 2023-11-20 LAB — TROPONIN I (HIGH SENSITIVITY)
Troponin I (High Sensitivity): 8 ng/L (ref <18)
Troponin I (High Sensitivity): 8 ng/L (ref <18)

## 2023-11-20 LAB — RESP PANEL BY RT-PCR (RSV, FLU A&B, COVID)  RVPGX2
Influenza A by PCR: NEGATIVE
Influenza B by PCR: NEGATIVE
Resp Syncytial Virus by PCR: NEGATIVE
SARS Coronavirus 2 by RT PCR: NEGATIVE

## 2023-11-20 LAB — BRAIN NATRIURETIC PEPTIDE: B Natriuretic Peptide: 796.9 pg/mL — ABNORMAL HIGH (ref 0.0–100.0)

## 2023-11-20 MED ORDER — METHYLPREDNISOLONE SODIUM SUCC 125 MG IJ SOLR
125.0000 mg | Freq: Once | INTRAMUSCULAR | Status: AC
  Administered 2023-11-20: 125 mg via INTRAVENOUS
  Filled 2023-11-20: qty 2

## 2023-11-20 MED ORDER — IOHEXOL 350 MG/ML SOLN
75.0000 mL | Freq: Once | INTRAVENOUS | Status: AC | PRN
  Administered 2023-11-20: 75 mL via INTRAVENOUS

## 2023-11-20 MED ORDER — IPRATROPIUM-ALBUTEROL 0.5-2.5 (3) MG/3ML IN SOLN
3.0000 mL | Freq: Once | RESPIRATORY_TRACT | Status: AC
  Administered 2023-11-20: 3 mL via RESPIRATORY_TRACT
  Filled 2023-11-20: qty 3

## 2023-11-20 MED ORDER — SODIUM CHLORIDE 0.9 % IV SOLN
500.0000 mg | Freq: Once | INTRAVENOUS | Status: AC
  Administered 2023-11-20: 500 mg via INTRAVENOUS
  Filled 2023-11-20: qty 5

## 2023-11-20 MED ORDER — SODIUM CHLORIDE 0.9 % IV SOLN
1.0000 g | Freq: Once | INTRAVENOUS | Status: AC
  Administered 2023-11-20: 1 g via INTRAVENOUS
  Filled 2023-11-20: qty 10

## 2023-11-20 MED ORDER — FUROSEMIDE 10 MG/ML IJ SOLN
40.0000 mg | Freq: Once | INTRAMUSCULAR | Status: AC
Start: 2023-11-20 — End: 2023-11-20
  Administered 2023-11-20: 40 mg via INTRAVENOUS
  Filled 2023-11-20: qty 4

## 2023-11-20 NOTE — Telephone Encounter (Signed)
Patient states having symptoms of shortness of breath and cough. Pharmacy is [facility]. Patient phone number is [phone].

## 2023-11-20 NOTE — ED Notes (Signed)
Pt took regular home nightly meds per EDP approval, BIB pt's daughter.

## 2023-11-20 NOTE — ED Triage Notes (Signed)
States three days into antibotic

## 2023-11-20 NOTE — ED Notes (Signed)
Pt ambulated to bathroom on room air. Pt initially started at 92% upon standing and dropped to 90% with initial ambulation. Upon ambulation back to room, pt desatted to 83% on room air. Respiratory was present and helped place pt back on monitor and oxygen. RT put pt on 3L O2 and SpO2 is now at 94-97%. Pt became labored with ambulation but is in NAD at this time, on monitor. EDP notified.

## 2023-11-20 NOTE — ED Provider Notes (Signed)
[facility] Provider Note  CSN: [id number] Arrival date & time: 11/20/23 1501  Chief Complaint(s) Cough and Shortness of Breath  HPI Holly Benson is a 89 y.o. female with past medical history as below, significant for atrial fibrillation, COPD, hyperlipidemia, hypertension, recent radiation treatment of lung cancer, pulmonary hypertension who presents to the ED with complaint of URI symptoms, hypoxia, fatigue, exertional dyspnea  She has been feeling unwell over the past week, she was started on a Z-Pak for presumed pneumonia by PCP.  Symptoms have progressed, she is having intermittent cough that is nonproductive, feels like the mucus is stuck in her chest.  No fevers or chills.  She is having bodyaches, increased fatigue.  Exertional dyspnea.  Family member checked her pulse ox at home this morning and it was in the 80s, they called pulmonology office for appointment but they said they were too busy and sent him to the ER for evaluation.  Past Medical History Past Medical History:  Diagnosis Date   Arrhythmia    Arthritis    Atrial fibrillation (HCC)    Constipation    chronic   COPD (chronic obstructive pulmonary disease) (HCC)    History of radiation therapy    Right lung- 09/22/23-09/29/23- Dr. Antony Blackbird   Hyperlipidemia    Hypertension    pulmonary   OSA on CPAP    Osteoarthritis    Osteoporosis    Peripheral neuropathy    Pulmonary hypertension (HCC)    RLS (restless legs syndrome)    Sleep apnea    Patient Active Problem List   Diagnosis Date Noted   History of CAD (coronary artery disease) 11/21/2023   History of lung cancer 11/21/2023   Chronic diastolic CHF (congestive heart failure) (HCC) 11/21/2023   Pulmonary hypertension (HCC) 11/21/2023   Acute exacerbation of CHF (congestive heart failure) (HCC) 11/21/2023   Acute hypoxemic respiratory failure (HCC) 11/20/2023   Lung nodule 08/31/2023   Osteoporosis 07/22/2022   Persistent  atrial fibrillation (HCC) 02/18/2022   Hypercoagulable state due to persistent atrial fibrillation (HCC) 05/17/2021   Abnormal CT of the chest 03/15/2021   Pneumonia 01/18/2021   PVD (peripheral vascular disease) (HCC) 08/01/2020   Chronic anticoagulation 08/01/2020   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 Authorizing Provider  acetaminophen (TYLENOL) 325 MG tablet Take 650 mg by mouth as needed for moderate pain or headache.    [provider]  albuterol (VENTOLIN HFA) 108 (90 Base) MCG/ACT inhaler INHALE TWO PUFFS BY MOUTH EVERY 6 HOURS AS NEEDED FOR WHEEZING OR FOR SHORTNESS OF BREATH 04/20/23   Parrett, Tammy S, NP  amLODipine (NORVASC) 5 MG tablet Take 1 tablet (5 mg total) by mouth daily. 02/17/23 10/15/23  Ronney Asters, NP  apixaban (ELIQUIS) 5 MG TABS tablet Take 1 tablet (5 mg total) by mouth 2 (two) times daily. 07/24/23   Camnitz, Will Daphine Deutscher, MD  azithromycin (ZITHROMAX) 250 MG tablet Take 2 tablets  on day 1, then 1 tablet daily on days 2 through 5 11/17/23 11/22/23  Philip Aspen, Limmie Patricia, MD  benzonatate (TESSALON) 100 MG capsule Take 1 capsule (100 mg total) by mouth 2 (two) times daily as needed for cough. 11/17/23   Philip Aspen, Limmie Patricia, MD  bisoprolol (ZEBETA) 10 MG tablet TAKE 1 TABLET BY MOUTH AT BEDTIME 09/14/23   Hilty, Lisette Abu, MD  Cholecalciferol (VITAMIN D3 MAXIMUM  STRENGTH) 125 MCG (5000 UT) capsule Take 5,000 Units by mouth daily.    [provider]  clonazePAM (KLONOPIN) 0.5 MG tablet Take 0.5 tablets (0.25 mg total) by mouth at bedtime as needed for anxiety. Patient taking differently: Take 0.25 mg by mouth at bedtime as needed (Restless leg). 02/12/23   Dohmeier, Porfirio Mylar, MD  docusate sodium (COLACE) 100 MG capsule Take 300 mg by mouth at bedtime.    [provider]  dofetilide (TIKOSYN) 250 MCG capsule TAKE 1 CAPSULE BY MOUTH 2 TIMES A DAY. 07/09/23   Sheilah Pigeon, PA-C  furosemide (LASIX) 20 MG tablet TAKE 1 TABLET BY MOUTH ON MONDAY, WEDNESDAY, AND FRIDAY. TAKE 2 TABLETS BY MOUTH ON ALL OTHER DAYS. 10/19/23   Philip Aspen, Limmie Patricia, MD  irbesartan (AVAPRO) 300 MG tablet TAKE 1 TABLET BY MOUTH DAILY 11/18/23   Hilty, Lisette Abu, MD  Misc Natural Products (COLON CLEANSE) CAPS Take 4 capsules by mouth as needed (constipation).    [provider]  Multiple Vitamins-Minerals (PRESERVISION AREDS 2) CAPS Take 1 tablet by mouth 2 (two) times daily. 08/11/22   [provider]  polyethylene glycol (MIRALAX / GLYCOLAX) 17 g packet Take 17 g by mouth at bedtime.    [provider]  PRESCRIPTION MEDICATION at bedtime as needed (Sometimes). CPAP    [provider]  rOPINIRole (REQUIP) 0.5 MG tablet One in AM and one after 3 Pm and one after dinner 8 Pm Patient taking differently: Take 0.5 mg by mouth in the morning, at noon, in the evening, and at bedtime. 02/12/23   Dohmeier, Porfirio Mylar, MD  rosuvastatin (CRESTOR) 20 MG tablet Take 1 tablet (20 mg total) by mouth daily. 09/25/23 12/24/23  Chrystie Nose, MD  trimethoprim-polymyxin b (POLYTRIM) ophthalmic solution Place 1 drop into both eyes every 4 (four) hours. 09/23/23   Deeann Saint, MD                                                                                                                                    Past Surgical History Past Surgical  History:  Procedure Laterality Date   ABDOMINAL AORTOGRAM W/LOWER EXTREMITY N/A 03/21/2020   Procedure: ABDOMINAL AORTOGRAM W/ Bilateral LOWER EXTREMITY Run[place];  Surgeon: Iran Ouch, MD;  Location: [facility];  Service: Cardiovascular;  Laterality: N/A;   ABDOMINAL HYSTERECTOMY Bilateral    arm surgery Right    Broken arm and has a plate in it  CARDIOVERSION N/A 03/12/2023   Procedure: CARDIOVERSION;  Surgeon: Quintella Reichert, MD;  Location: [facility];  Service: Cardiovascular;  Laterality: N/A;   CATARACT EXTRACTION Bilateral    COLONOSCOPY     More than 10 years ago In [facility]   HERNIA REPAIR  08/2022   KNEE ARTHROSCOPY Right    PERIPHERAL VASCULAR INTERVENTION Bilateral 03/21/2020   Procedure: PERIPHERAL VASCULAR INTERVENTION;  Surgeon: Iran Ouch, MD;  Location: [facility];  Service: Cardiovascular;  Laterality: Bilateral;  external iliac   TONSILLECTOMY     ventral herniorrhaphy  2023   mesh used   Family History Family History  Problem Relation Age of Onset   Suicidality Father    Stroke Maternal Grandfather    Hypertension Sister    Colon cancer Neg Hx    Esophageal cancer Neg Hx    Pancreatic cancer Neg Hx    Stomach cancer Neg Hx    Liver disease Neg Hx     Social History Social History   Tobacco Use   Smoking status: Former    Current packs/day: 0.00    Average packs/day: 1.5 packs/day for 37.0 years (55.5 ttl pk-yrs)    Types: Cigarettes    Start date: 67    Quit date: 11/04/1991    Years since quitting: 32.0   Smokeless tobacco: Never   Tobacco comments:    Former smoker 03/14/22  Vaping Use   Vaping status: Never Used  Substance Use Topics   Alcohol use: Yes    Alcohol/week: 7.0 standard drinks of alcohol    Types: 7 Glasses of wine per week    Comment: 1 glass of wine nightly 03/14/22   Drug use: No   Allergies Lovenox [enoxaparin sodium] and Metoprolol succinate [metoprolol]  Review of  Systems Review of Systems  Constitutional:  Positive for appetite change and fatigue. Negative for chills and fever.  HENT:  Positive for postnasal drip and rhinorrhea.   Respiratory:  Positive for cough, chest tightness, shortness of breath and wheezing.   Cardiovascular:  Positive for leg swelling. Negative for chest pain and palpitations.  Gastrointestinal:  Negative for abdominal pain and nausea.  Neurological:  Negative for syncope and headaches.  All other systems reviewed and are negative.   Physical Exam Vital Signs  I have reviewed the triage vital signs BP 129/60 (BP Location: Left Arm)   Pulse 74   Temp (!) 97.3 F (36.3 C) (Oral)   Resp 20   Ht 5' 2.75" (1.594 m)   Wt 66.2 kg   SpO2 93%   BMI 26.07 kg/m  Physical Exam Vitals and nursing note reviewed.  Constitutional:      General: She is not in acute distress.    Appearance: Normal appearance. She is well-developed.  HENT:     Head: Normocephalic and atraumatic.     Right Ear: External ear normal.     Left Ear: External ear normal.     Nose: Nose normal.     Mouth/Throat:     Mouth: Mucous membranes are moist.  Eyes:     General: No scleral icterus.       Right eye: No discharge.        Left eye: No discharge.  Cardiovascular:     Rate and Rhythm: Normal rate and regular rhythm.     Pulses: Normal pulses.     Heart sounds: Normal heart sounds.  Pulmonary:     Effort: Pulmonary effort is normal.  Tachypnea present. No respiratory distress.     Breath sounds: No stridor. Wheezing present.  Abdominal:     General: Abdomen is flat. There is no distension.     Palpations: Abdomen is soft.     Tenderness: There is no abdominal tenderness.  Musculoskeletal:     Cervical back: No rigidity.     Right lower leg: Edema present.     Left lower leg: Edema present.     Comments: Trace pitting bilateral  Skin:    General: Skin is warm and dry.     Capillary Refill: Capillary refill takes less than 2 seconds.   Neurological:     Mental Status: She is alert.  Psychiatric:        Mood and Affect: Mood normal.        Behavior: Behavior normal. Behavior is cooperative.     ED Results and Treatments Labs (all labs ordered are listed, but only abnormal results are displayed) Labs Reviewed  BASIC METABOLIC PANEL - Abnormal; Notable for the following components:      Result Value   Glucose, Bld 100 (*)    All other components within normal limits  BRAIN NATRIURETIC PEPTIDE - Abnormal; Notable for the following components:   B Natriuretic Peptide 796.9 (*)    All other components within normal limits  COMPREHENSIVE METABOLIC PANEL - Abnormal; Notable for the following components:   Sodium 133 (*)    Glucose, Bld 141 (*)    Calcium 8.7 (*)    All other components within normal limits  CBC - Abnormal; Notable for the following components:   RBC 3.71 (*)    All other components within normal limits  RESP PANEL BY RT-PCR (RSV, FLU A&B, COVID)  [id number]  CBC WITH DIFFERENTIAL/PLATELET  PROCALCITONIN  TROPONIN I (HIGH SENSITIVITY)  TROPONIN I (HIGH SENSITIVITY)                                                                                                                          Radiology ECHOCARDIOGRAM COMPLETE Result Date: 11/21/2023    ECHOCARDIOGRAM REPORT   Patient Name:   KAELEI KORMAN [facility] Date of Exam: 11/21/2023 Medical Rec #:  [id number]           Height:       62.8 in Accession #:    [id number]          Weight:       146.0 lb Date of Birth:  12-13-1932           BSA:          1.687 m Patient Age:    89 years            BP:           137/70 mmHg Patient Gender: F                   HR:           81  bpm. Exam Location:  Inpatient Procedure: 2D Echo, Color Doppler, Cardiac Doppler and 3D Echo Indications:    CHF- Acute Diastolic  History:        Patient has prior history of Echocardiogram examinations, most                 recent 02/03/2023. COPD, Signs/Symptoms:Shortness of Breath; Risk                  Factors:Pulmonary Hypertension, Hypertension, Dyslipidemia and                 Sleep Apnea.  Sonographer:    Raeford Razor RDCS Referring Phys: [id number] SUBRINA SUNDIL IMPRESSIONS  1. Left ventricular ejection fraction, by estimation, is 60 to 65%. The left ventricle has normal function. The left ventricle has no regional wall motion abnormalities. Left ventricular diastolic parameters are indeterminate.  2. Right ventricular systolic function is normal. The right ventricular size is mildly enlarged. There is severely elevated pulmonary artery systolic pressure.  3. Left atrial size was severely dilated.  4. Right atrial size was severely dilated.  5. The mitral valve is normal in structure. Mild mitral valve regurgitation. No evidence of mitral stenosis.  6. Tricuspid valve regurgitation is moderate.  7. The aortic valve is tricuspid. Aortic valve regurgitation is mild. No aortic stenosis is present.  8. The inferior vena cava is dilated in size with >50% respiratory variability, suggesting right atrial pressure of 8 mmHg. FINDINGS  Left Ventricle: Left ventricular ejection fraction, by estimation, is 60 to 65%. The left ventricle has normal function. The left ventricle has no regional wall motion abnormalities. The left ventricular internal cavity size was normal in size. There is  no left ventricular hypertrophy. Left ventricular diastolic parameters are indeterminate. Right Ventricle: The right ventricular size is mildly enlarged. Right ventricular systolic function is normal. There is severely elevated pulmonary artery systolic pressure. The tricuspid regurgitant velocity is 3.70 m/s, and with an assumed right atrial  pressure of 8 mmHg, the estimated right ventricular systolic pressure is 62.8 mmHg. Left Atrium: Left atrial size was severely dilated. Right Atrium: Right atrial size was severely dilated. Pericardium: There is no evidence of pericardial effusion. Mitral Valve: The mitral valve is normal in  structure. Mild mitral annular calcification. Mild mitral valve regurgitation. No evidence of mitral valve stenosis. Tricuspid Valve: The tricuspid valve is normal in structure. Tricuspid valve regurgitation is moderate . No evidence of tricuspid stenosis. Aortic Valve: The aortic valve is tricuspid. Aortic valve regurgitation is mild. Aortic regurgitation PHT measures 320 msec. No aortic stenosis is present. Aortic valve mean gradient measures 4.0 mmHg. Aortic valve peak gradient measures 7.1 mmHg. Aortic  valve area, by VTI measures 2.07 cm. Pulmonic Valve: The pulmonic valve was normal in structure. Pulmonic valve regurgitation is not visualized. No evidence of pulmonic stenosis. Aorta: The aortic root is normal in size and structure. Venous: The inferior vena cava is dilated in size with greater than 50% respiratory variability, suggesting right atrial pressure of 8 mmHg. IAS/Shunts: No atrial level shunt detected by color flow Doppler.  LEFT VENTRICLE PLAX 2D LVIDd:         4.10 cm   Diastology LVIDs:         2.90 cm   LV e' medial:   5.98 cm/s LV PW:         1.10 cm   LV E/e' medial: 21.4 LV IVS:        1.00 cm LVOT diam:  1.90 cm LV SV:         63 LV SV Index:   37 LVOT Area:     2.84 cm  3D Volume EF:                          3D EF:        64 %                          LV EDV:       92 ml                          LV ESV:       33 ml                          LV SV:        59 ml RIGHT VENTRICLE             IVC RV Basal diam:  3.60 cm     IVC diam: 2.50 cm RV Mid diam:    2.60 cm RV S prime:     11.10 cm/s TAPSE (M-mode): 1.8 cm LEFT ATRIUM              Index        RIGHT ATRIUM           Index LA diam:        4.20 cm  2.49 cm/m   RA Area:     32.30 cm LA Vol (A2C):   102.0 ml 60.46 ml/m  RA Volume:   116.00 ml 68.76 ml/m LA Vol (A4C):   106.0 ml 62.83 ml/m LA Biplane Vol: 110.0 ml 65.21 ml/m  AORTIC VALVE AV Area (Vmax):    2.15 cm AV Area (Vmean):   2.07 cm AV Area (VTI):     2.07 cm AV Vmax:            133.00 cm/s AV Vmean:          93.700 cm/s AV VTI:            0.306 m AV Peak Grad:      7.1 mmHg AV Mean Grad:      4.0 mmHg LVOT Vmax:         101.00 cm/s LVOT Vmean:        68.300 cm/s LVOT VTI:          0.223 m LVOT/AV VTI ratio: 0.73 AI PHT:            320 msec  AORTA Ao Root diam: 3.10 cm Ao Asc diam:  2.80 cm MITRAL VALVE                TRICUSPID VALVE MV Area (PHT): 4.36 cm     TR Peak grad:   54.8 mmHg MV Decel Time: 174 msec     TR Vmax:        370.00 cm/s MV E velocity: 128.00 cm/s                             SHUNTS                             Systemic VTI:  0.22 m  Systemic Diam: 1.90 cm Olga Millers MD Electronically signed by Olga Millers MD Signature Date/Time: 11/21/2023/2:10:06 PM    Final    CT Angio Chest PE W and/or Wo Contrast Result Date: 11/20/2023 CLINICAL DATA:  High probability pulmonary embolism, dyspnea, lung cancer. * Tracking Code: BO * EXAM: CT ANGIOGRAPHY CHEST WITH CONTRAST TECHNIQUE: Multidetector CT imaging of the chest was performed using the standard protocol during bolus administration of intravenous contrast. Multiplanar CT image reconstructions and MIPs were obtained to evaluate the vascular anatomy. RADIATION DOSE REDUCTION: This exam was performed according to the departmental dose-optimization program which includes automated exposure control, adjustment of the mA and/or kV according to patient size and/or use of iterative reconstruction technique. CONTRAST:  75mL OMNIPAQUE IOHEXOL 350 MG/ML SOLN COMPARISON:  09/18/2023 FINDINGS: Cardiovascular: Extensive multi-vessel coronary artery calcification. Global cardiac size is enlarged with biatrial enlargement noted. No pericardial effusion. The central pulmonary arteries are of normal caliber. There is no intraluminal filling defect identified to suggest acute pulmonary embolism. Extensive atherosclerotic calcification within the thoracic aorta. No aortic aneurysm. Mediastinum/Nodes: No  enlarged mediastinal, hilar, or axillary lymph nodes. Thyroid gland, trachea, and esophagus demonstrate no significant findings. Lungs/Pleura: 11 mm right apical nodule, noted to be hypermetabolic on 08/13/2023 and compatible with patient's known primary bronchogenic malignancy appears stable. Interval development of surrounding architectural distortion with inter and intra lobular septal thickening at the right apex may reflect changes of radiation therapy. Interval development of multiple nodules within the left upper lobe measuring up to 13 x 18 mm axial # 62/6, nonspecific, possibly infectious though development of multiple intra pulmonary metastases is not excluded. The latter is considered less likely, however, given its relatively isolated distribution within the left upper lobe only. Superimposed mild emphysema. Small bilateral pleural effusions have developed. No pneumothorax. No central obstructing lesion. Upper Abdomen: No acute abnormality. Musculoskeletal: No acute bone abnormality. No lytic or blastic bone lesion. Review of the MIP images confirms the above findings. IMPRESSION: 1. No pulmonary embolism. 2. Stable 11 mm right apical nodule, noted to be hypermetabolic on 08/13/2023 and compatible with patient's known primary bronchogenic malignancy. Interval development of surrounding architectural distortion with inter and intra lobular septal thickening at the right apex may reflect changes of radiation therapy. 3. Interval development of multiple nodules within the left upper lobe measuring up to 13 x 18 mm, nonspecific, possibly infectious though development of multiple intra pulmonary metastases is not excluded. The latter is considered less likely, given its relatively isolated distribution within the left upper lobe only. Short-term follow-up imaging at 3 months would be helpful in differentiating these entities 4. Interval development of small bilateral pleural effusions. 5. Extensive  multi-vessel coronary artery calcification. 6. Mild edema Aortic Atherosclerosis (ICD10-I70.0) and Emphysema (ICD10-J43.9). Electronically Signed   By: Helyn Numbers M.D.   On: 11/20/2023 21:17   DG Chest Port 1 View Result Date: 11/20/2023 CLINICAL DATA:  Difficulty breathing. EXAM: PORTABLE CHEST 1 VIEW COMPARISON:  01/31/2021. FINDINGS: There are chronic nonspecific opacities throughout bilateral lungs including bilateral lung apices, favored to represent fibrosis/scarring. Bilateral lung fields are otherwise clear. No acute consolidation or lung collapse. Bilateral costophrenic angles are clear. Stable cardio-mediastinal silhouette. No acute osseous abnormalities. Right proximal humerus metallic hardware noted. The soft tissues are within normal limits. IMPRESSION: *No acute cardiopulmonary abnormality. Electronically Signed   By: Jules Schick M.D.   On: 11/20/2023 16:37    Pertinent labs & imaging results that were available during my care of the patient were reviewed  by me and considered in my medical decision making (see MDM for details).  Medications Ordered in ED Medications  dofetilide (TIKOSYN) capsule 250 mcg (250 mcg Oral Given 11/21/23 0940)  furosemide (LASIX) tablet 20 mg (has no administration in time range)  irbesartan (AVAPRO) tablet 300 mg (300 mg Oral Given 11/21/23 0940)  rosuvastatin (CRESTOR) tablet 20 mg (20 mg Oral Given 11/21/23 0942)  docusate sodium (COLACE) capsule 300 mg (has no administration in time range)  polyethylene glycol (MIRALAX / GLYCOLAX) packet 17 g (has no administration in time range)  apixaban (ELIQUIS) tablet 5 mg (5 mg Oral Given 11/21/23 0942)  rOPINIRole (REQUIP) tablet 0.5 mg (0.5 mg Oral Given 11/21/23 0940)  clonazePAM (KLONOPIN) tablet 0.5 mg (has no administration in time range)  bisoprolol (ZEBETA) tablet 10 mg (has no administration in time range)  methylPREDNISolone sodium succinate (SOLU-MEDROL) 40 mg/mL injection 40 mg (40 mg Intravenous  Given 11/21/23 0725)  azithromycin (ZITHROMAX) 500 mg in sodium chloride 0.9 % 250 mL IVPB (has no administration in time range)  ipratropium-albuterol (DUONEB) 0.5-2.5 (3) MG/3ML nebulizer solution 3 mL (has no administration in time range)  acetaminophen (TYLENOL) tablet 650 mg (has no administration in time range)    Or  acetaminophen (TYLENOL) suppository 650 mg (has no administration in time range)  ondansetron (ZOFRAN) tablet 4 mg (has no administration in time range)    Or  ondansetron (ZOFRAN) injection 4 mg (has no administration in time range)  pantoprazole (PROTONIX) EC tablet 40 mg (40 mg Oral Given 11/21/23 0149)  guaiFENesin (MUCINEX) 12 hr tablet 600 mg (600 mg Oral Given 11/21/23 0941)  ipratropium (ATROVENT) nebulizer solution 0.5 mg (0.5 mg Nebulization Given 11/21/23 1424)  metoprolol tartrate (LOPRESSOR) injection 5 mg (has no administration in time range)  senna-docusate (Senokot-S) tablet 1 tablet (has no administration in time range)  guaiFENesin (ROBITUSSIN) 100 MG/5ML liquid 5 mL (has no administration in time range)  hydrALAZINE (APRESOLINE) injection 10 mg (has no administration in time range)  levalbuterol (XOPENEX) nebulizer solution 0.63 mg (has no administration in time range)  ipratropium-albuterol (DUONEB) 0.5-2.5 (3) MG/3ML nebulizer solution 3 mL (3 mLs Nebulization Given 11/20/23 1559)  methylPREDNISolone sodium succinate (SOLU-MEDROL) 125 mg/2 mL injection 125 mg (125 mg Intravenous Given 11/20/23 1627)  ipratropium-albuterol (DUONEB) 0.5-2.5 (3) MG/3ML nebulizer solution 3 mL (3 mLs Nebulization Given 11/20/23 1727)  furosemide (LASIX) injection 40 mg (40 mg Intravenous Given 11/20/23 1853)  iohexol (OMNIPAQUE) 350 MG/ML injection 75 mL (75 mLs Intravenous Contrast Given 11/20/23 2049)  cefTRIAXone (ROCEPHIN) 1 g in sodium chloride 0.9 % 100 mL IVPB (0 g Intravenous Stopped 11/20/23 2257)  azithromycin (ZITHROMAX) 500 mg in sodium chloride 0.9 % 250 mL IVPB (0 mg  Intravenous Stopped 11/21/23 0012)  furosemide (LASIX) injection 20 mg (20 mg Intravenous Given 11/21/23 1259)  Procedures .Critical Care  Performed by: Sloan Leiter, DO Authorized by: Sloan Leiter, DO   Critical care provider statement:    Critical care time (minutes):  30   Critical care time was exclusive of:  Separately billable procedures and treating other patients   Critical care was necessary to treat or prevent imminent or life-threatening deterioration of the following conditions:  Respiratory failure   Critical care was time spent personally by me on the following activities:  Development of treatment plan with patient or surrogate, discussions with consultants, evaluation of patient's response to treatment, examination of patient, ordering and review of laboratory studies, ordering and review of radiographic studies, ordering and performing treatments and interventions, pulse oximetry, re-evaluation of patient's condition, review of old charts and obtaining history from patient or surrogate   Care discussed with: admitting provider     (including critical care time)  Medical Decision Making / ED Course    Medical Decision Making:    Laionna Barrionuevo is a 89 y.o. female with past medical history as below, significant for atrial fibrillation, COPD, hyperlipidemia, hypertension, recent radiation treatment of lung cancer, pulmonary hypertension who presents to the ED with complaint of URI symptoms, hypoxia, fatigue, exertional dyspnea. The complaint involves an extensive differential diagnosis and also carries with it a high risk of complications and morbidity.  Serious etiology was considered. Ddx includes but is not limited to: In my evaluation of this patient's dyspnea my DDx includes, but is not limited to, pneumonia, pulmonary embolism,  pneumothorax, pulmonary edema, metabolic acidosis, asthma, COPD, cardiac cause, anemia, anxiety, etc.    Complete initial physical exam performed, notably the patient was in hypoxia noted in triage, tachypnea.  Wheezing usually.    Reviewed and confirmed nursing documentation for past medical history, family history, social history.  Vital signs reviewed.      Clinical Course as of 11/21/23 1435  Fri Nov 20, 2023  1738 B Natriuretic Peptide(!): 796.9 She is on lasix, does report mildly increased LE edema. Trace pitting edema b/l LE on exam.  [SG]  1939 Well's score is low, single episode of tachycardia while ambulating [SG]  1939 Pulse ox 83-85% with increased WOB with minimal ambulation to restroom (around 5-41ft) [SG]  1952 CT coronary 11/24 negative for obstructive CAD ECHO 4/24 w/ LVEF 60-65%, mitral regurg, aortic regurg  [SG]  2126 CT PE with small pleural effusions, possible pneumonia, possible progression of malignancy.  Will cover with Rocephin and azithromycin.  Continue diuresis.  Continue plan for admission. [SG]  2337 Admitted [facility] Dr Loney Loh [SG]    Clinical Course User Index [SG] Sloan Leiter, DO    Brief summary: 89 year old female with history as above including COPD, pulmonary hypertension, lung cancer here with URI symptoms progressive, dyspnea, fatigue.  She was started on a Z-Pak last week without improvement to her symptoms.  Cough has progressed.  Having worsening dorsal dyspnea.  Pulse ox with exertion 85 to 88%.  When sitting at rest pulse ox will increase to 92%.  She does not wear oxygen at home  Labs reviewed, BNP is elevated, start Lasix.  Takes Lasix at home, but has been off it for the past few weeks.  She has trace LE edema pitting.  No evidence of pulm edema on chest x-ray.  She has some wheezing, history of COPD, productive cough, presumed recent viral infection/URI.  Given nebulized breathing treatment, steroids, minimal improvement to breathing.   Continues to require 3 L, no home  o2 use.   CT  c/w CHF, possible PNA, cover w/ abx. Recommend admission for COPD/CHF exacerbation, ?pna.  Hypoxia.  Pt/family agree  Dr Loney Loh accepting              Additional history obtained: -Additional history obtained from family -External records from outside source obtained and reviewed including: Chart review including previous notes, labs, imaging, consultation notes including  Recommendation, home medications, prior labs imaging   Lab Tests: -I ordered, reviewed, and interpreted labs.   The pertinent results include:   Labs Reviewed  BASIC METABOLIC PANEL - Abnormal; Notable for the following components:      Result Value   Glucose, Bld 100 (*)    All other components within normal limits  BRAIN NATRIURETIC PEPTIDE - Abnormal; Notable for the following components:   B Natriuretic Peptide 796.9 (*)    All other components within normal limits  COMPREHENSIVE METABOLIC PANEL - Abnormal; Notable for the following components:   Sodium 133 (*)    Glucose, Bld 141 (*)    Calcium 8.7 (*)    All other components within normal limits  CBC - Abnormal; Notable for the following components:   RBC 3.71 (*)    All other components within normal limits  RESP PANEL BY RT-PCR (RSV, FLU A&B, COVID)  RVPGX2  CBC WITH DIFFERENTIAL/PLATELET  PROCALCITONIN  TROPONIN I (HIGH SENSITIVITY)  TROPONIN I (HIGH SENSITIVITY)    Notable for bnp +  EKG   EKG Interpretation Date/Time:  Friday November 20 2023 15:28:06 EST Ventricular Rate:  74 PR Interval:  152 QRS Duration:  92 QT Interval:  432 QTC Calculation: 479 R Axis:   10  Text Interpretation: Sinus rhythm with Premature atrial complexes with Abberant conduction Incomplete right bundle branch block Nonspecific ST and T wave abnormality Abnormal ECG When compared with ECG of 07-Oct-2023 09:20, Abberant conduction is now Present Criteria for Septal infarct are no longer Present Nonspecific T  wave abnormality now evident in Anterior leads similar to prior Confirmed by Tanda Rockers ([id number]) on 11/20/2023 4:20:27 PM         Imaging Studies ordered: I ordered imaging studies including cXR I independently visualized the following imaging with scope of interpretation limited to determining acute life threatening conditions related to emergency care; findings noted above I independently visualized and interpreted imaging. I agree with the radiologist interpretation   Medicines ordered and prescription drug management: Meds ordered this encounter  Medications   ipratropium-albuterol (DUONEB) 0.5-2.5 (3) MG/3ML nebulizer solution 3 mL   methylPREDNISolone sodium succinate (SOLU-MEDROL) 125 mg/2 mL injection 125 mg   ipratropium-albuterol (DUONEB) 0.5-2.5 (3) MG/3ML nebulizer solution 3 mL   furosemide (LASIX) injection 40 mg   iohexol (OMNIPAQUE) 350 MG/ML injection 75 mL   cefTRIAXone (ROCEPHIN) 1 g in sodium chloride 0.9 % 100 mL IVPB    Antibiotic Indication::   CAP   azithromycin (ZITHROMAX) 500 mg in sodium chloride 0.9 % 250 mL IVPB    Antibiotic Indication::   CAP   DISCONTD: amLODipine (NORVASC) tablet 5 mg   dofetilide (TIKOSYN) capsule 250 mcg   furosemide (LASIX) tablet 20 mg   irbesartan (AVAPRO) tablet 300 mg   rosuvastatin (CRESTOR) tablet 20 mg   docusate sodium (COLACE) capsule 300 mg   polyethylene glycol (MIRALAX / GLYCOLAX) packet 17 g   apixaban (ELIQUIS) tablet 5 mg   rOPINIRole (REQUIP) tablet 0.5 mg    One in AM and one after 3 Pm and one after dinner 8  Pm     clonazePAM (KLONOPIN) tablet 0.5 mg   bisoprolol (ZEBETA) tablet 10 mg   methylPREDNISolone sodium succinate (SOLU-MEDROL) 40 mg/mL injection 40 mg    IV methylprednisolone will be converted to either a q12h or q24h frequency with the same total daily dose (TDD).  Ordered Dose: 1 to 125 mg TDD; convert to: TDD q24h.  Ordered Dose: 126 to 250 mg TDD; convert to: TDD div q12h.  Ordered Dose: >250  mg TDD; DAW.   azithromycin (ZITHROMAX) 500 mg in sodium chloride 0.9 % 250 mL IVPB    Antibiotic Indication::   Other Indication (list below)    Other Indication::   COPD exacerbation   ipratropium-albuterol (DUONEB) 0.5-2.5 (3) MG/3ML nebulizer solution 3 mL   DISCONTD: ipratropium (ATROVENT) nebulizer solution 0.5 mg   DISCONTD: levalbuterol (XOPENEX) nebulizer solution 0.63 mg   OR Linked Order Group    acetaminophen (TYLENOL) tablet 650 mg    acetaminophen (TYLENOL) suppository 650 mg   OR Linked Order Group    ondansetron (ZOFRAN) tablet 4 mg    ondansetron (ZOFRAN) injection 4 mg   pantoprazole (PROTONIX) EC tablet 40 mg   guaiFENesin (MUCINEX) 12 hr tablet 600 mg   ipratropium (ATROVENT) nebulizer solution 0.5 mg   DISCONTD: levalbuterol (XOPENEX) nebulizer solution 0.63 mg   DISCONTD: furosemide (LASIX) injection 20 mg   metoprolol tartrate (LOPRESSOR) injection 5 mg   senna-docusate (Senokot-S) tablet 1 tablet   guaiFENesin (ROBITUSSIN) 100 MG/5ML liquid 5 mL   hydrALAZINE (APRESOLINE) injection 10 mg   furosemide (LASIX) injection 20 mg   levalbuterol (XOPENEX) nebulizer solution 0.63 mg    -I have reviewed the patients home medicines and have made adjustments as needed   Consultations Obtained: na   Cardiac Monitoring: The patient was maintained on a cardiac monitor.  I personally viewed and interpreted the cardiac monitored which showed an underlying rhythm of: nsr Continuous pulse oximetry interpreted by myself, 97% on 3L.  83% on RA w/ exertion   Social Determinants of Health:  Diagnosis or treatment significantly limited by social determinants of health: former smoker   Reevaluation: After the interventions noted above, I reevaluated the patient and found that they have improved  Co morbidities that complicate the patient evaluation  Past Medical History:  Diagnosis Date   Arrhythmia    Arthritis    Atrial fibrillation (HCC)    Constipation     chronic   COPD (chronic obstructive pulmonary disease) (HCC)    History of radiation therapy    Right lung- 09/22/23-09/29/23- Dr. Antony Blackbird   Hyperlipidemia    Hypertension    pulmonary   OSA on CPAP    Osteoarthritis    Osteoporosis    Peripheral neuropathy    Pulmonary hypertension (HCC)    RLS (restless legs syndrome)    Sleep apnea       Dispostion: Disposition decision including need for hospitalization was considered, and patient admitted to the hospital.    Final Clinical Impression(s) / ED Diagnoses Final diagnoses:  COPD exacerbation (HCC)  Hypoxia  Acute congestive heart failure, unspecified heart failure type (HCC)  Pleural effusion        Sloan Leiter, DO 11/21/23 1436

## 2023-11-20 NOTE — Telephone Encounter (Signed)
I called and spoke with the pt. Pt states she has had a possible cold last week. Her pcp gave her an antibiotic for possible bronchitis. The azithromycin has not helped. Pt states her o2 is 94 at rest and 88 with exertion. Pt is not on oxygen. Pt states she has had a wet cough for about a week. Pt states this is starting to drain. The mucous is white. She complains of sob with exertion and had a hard time catching her breath. Pt states she is wanting an antibiotic to help with this. Please advise.

## 2023-11-20 NOTE — ED Triage Notes (Signed)
Hx of lung CA.  Has Upper respiratory infection and seen by pulmonary this am and told to come to ER due to low pulse ox. Becomes SOB with ambulation.  SOB on walking to triage.  Sat 88% and rise to 92% with sitting.  Slight labored breathing.  Congested cough

## 2023-11-20 NOTE — ED Notes (Signed)
RT Note: Patient placed on 2lpm [place] to maintain oxygen saturation at 97%

## 2023-11-20 NOTE — Telephone Encounter (Signed)
Unfortunately if she is having new low oxygen levels, she needs to go to the ED for further evaluation as this could be many things including pneumonia, blood clot, heart problem, etc. 88% is an indication for oxygen therapy, which we can't get to her without in person evaluation. I don't have a way to see her in person today as the office closed at 12 and I don't want her to wait until Monday. Please schedule her a follow up with [place] in a few weeks to ensure she is better. Thanks.

## 2023-11-20 NOTE — ED Notes (Signed)
RT Note:  Patient oxygen saturation on room air while at rest = 93% Patient oxygen saturation on room air while ambulating around room = 87% Patient placed on 2lpm [place] to maintain oxygen saturation 96%

## 2023-11-20 NOTE — Telephone Encounter (Signed)
I called and spoke with the pt and explained Holly's message. I informed the pt that O2 levels at 88% requires oxygen, and the pt is not on oxygen at all. Pt verbalized understanding, and she stated she will go. I also informed the pt that our office will call her to get her scheduled for a f/u in 2-3 weeks per Holly Benson's, Np orders. Pt verbalized understanding. NFN. Sending to front desk for scheduling.

## 2023-11-21 ENCOUNTER — Inpatient Hospital Stay (HOSPITAL_COMMUNITY): Payer: Medicare Other

## 2023-11-21 ENCOUNTER — Encounter (HOSPITAL_COMMUNITY): Payer: Self-pay | Admitting: Internal Medicine

## 2023-11-21 DIAGNOSIS — C3490 Malignant neoplasm of unspecified part of unspecified bronchus or lung: Secondary | ICD-10-CM | POA: Diagnosis present

## 2023-11-21 DIAGNOSIS — G2581 Restless legs syndrome: Secondary | ICD-10-CM | POA: Diagnosis present

## 2023-11-21 DIAGNOSIS — I491 Atrial premature depolarization: Secondary | ICD-10-CM | POA: Diagnosis present

## 2023-11-21 DIAGNOSIS — Z90710 Acquired absence of both cervix and uterus: Secondary | ICD-10-CM | POA: Diagnosis not present

## 2023-11-21 DIAGNOSIS — I2510 Atherosclerotic heart disease of native coronary artery without angina pectoris: Secondary | ICD-10-CM | POA: Diagnosis present

## 2023-11-21 DIAGNOSIS — J441 Chronic obstructive pulmonary disease with (acute) exacerbation: Secondary | ICD-10-CM

## 2023-11-21 DIAGNOSIS — E785 Hyperlipidemia, unspecified: Secondary | ICD-10-CM | POA: Diagnosis present

## 2023-11-21 DIAGNOSIS — I420 Dilated cardiomyopathy: Secondary | ICD-10-CM | POA: Diagnosis present

## 2023-11-21 DIAGNOSIS — Z85118 Personal history of other malignant neoplasm of bronchus and lung: Secondary | ICD-10-CM

## 2023-11-21 DIAGNOSIS — I080 Rheumatic disorders of both mitral and aortic valves: Secondary | ICD-10-CM | POA: Diagnosis present

## 2023-11-21 DIAGNOSIS — I509 Heart failure, unspecified: Secondary | ICD-10-CM

## 2023-11-21 DIAGNOSIS — I110 Hypertensive heart disease with heart failure: Secondary | ICD-10-CM | POA: Diagnosis present

## 2023-11-21 DIAGNOSIS — I272 Pulmonary hypertension, unspecified: Secondary | ICD-10-CM | POA: Insufficient documentation

## 2023-11-21 DIAGNOSIS — F411 Generalized anxiety disorder: Secondary | ICD-10-CM | POA: Diagnosis present

## 2023-11-21 DIAGNOSIS — M810 Age-related osteoporosis without current pathological fracture: Secondary | ICD-10-CM | POA: Diagnosis present

## 2023-11-21 DIAGNOSIS — J9601 Acute respiratory failure with hypoxia: Secondary | ICD-10-CM | POA: Diagnosis present

## 2023-11-21 DIAGNOSIS — I5032 Chronic diastolic (congestive) heart failure: Secondary | ICD-10-CM | POA: Insufficient documentation

## 2023-11-21 DIAGNOSIS — Z923 Personal history of irradiation: Secondary | ICD-10-CM | POA: Diagnosis not present

## 2023-11-21 DIAGNOSIS — I10 Essential (primary) hypertension: Secondary | ICD-10-CM

## 2023-11-21 DIAGNOSIS — Z7901 Long term (current) use of anticoagulants: Secondary | ICD-10-CM | POA: Diagnosis not present

## 2023-11-21 DIAGNOSIS — Z87891 Personal history of nicotine dependence: Secondary | ICD-10-CM | POA: Diagnosis not present

## 2023-11-21 DIAGNOSIS — J90 Pleural effusion, not elsewhere classified: Secondary | ICD-10-CM | POA: Diagnosis present

## 2023-11-21 DIAGNOSIS — I5043 Acute on chronic combined systolic (congestive) and diastolic (congestive) heart failure: Secondary | ICD-10-CM | POA: Diagnosis not present

## 2023-11-21 DIAGNOSIS — I480 Paroxysmal atrial fibrillation: Secondary | ICD-10-CM | POA: Diagnosis present

## 2023-11-21 DIAGNOSIS — Z8679 Personal history of other diseases of the circulatory system: Secondary | ICD-10-CM

## 2023-11-21 DIAGNOSIS — I5031 Acute diastolic (congestive) heart failure: Secondary | ICD-10-CM

## 2023-11-21 DIAGNOSIS — Z1152 Encounter for screening for COVID-19: Secondary | ICD-10-CM | POA: Diagnosis not present

## 2023-11-21 DIAGNOSIS — I5033 Acute on chronic diastolic (congestive) heart failure: Secondary | ICD-10-CM | POA: Diagnosis present

## 2023-11-21 DIAGNOSIS — Z8249 Family history of ischemic heart disease and other diseases of the circulatory system: Secondary | ICD-10-CM | POA: Diagnosis not present

## 2023-11-21 DIAGNOSIS — Z823 Family history of stroke: Secondary | ICD-10-CM | POA: Diagnosis not present

## 2023-11-21 DIAGNOSIS — Z79899 Other long term (current) drug therapy: Secondary | ICD-10-CM | POA: Diagnosis not present

## 2023-11-21 LAB — ECHOCARDIOGRAM COMPLETE
AR max vel: 2.15 cm2
AV Area VTI: 2.07 cm2
AV Area mean vel: 2.07 cm2
AV Mean grad: 4 mm[Hg]
AV Peak grad: 7.1 mm[Hg]
Ao pk vel: 1.33 m/s
Area-P 1/2: 4.36 cm2
Height: 62.75 in
P 1/2 time: 320 ms
S' Lateral: 2.9 cm
Weight: 2336 [oz_av]

## 2023-11-21 LAB — CBC
HCT: 37.1 % (ref 36.0–46.0)
Hemoglobin: 12.2 g/dL (ref 12.0–15.0)
MCH: 32.9 pg (ref 26.0–34.0)
MCHC: 32.9 g/dL (ref 30.0–36.0)
MCV: 100 fL (ref 80.0–100.0)
Platelets: 280 10*3/uL (ref 150–400)
RBC: 3.71 MIL/uL — ABNORMAL LOW (ref 3.87–5.11)
RDW: 14.5 % (ref 11.5–15.5)
WBC: 7 10*3/uL (ref 4.0–10.5)
nRBC: 0 % (ref 0.0–0.2)

## 2023-11-21 LAB — COMPREHENSIVE METABOLIC PANEL
ALT: 24 U/L (ref 0–44)
AST: 21 U/L (ref 15–41)
Albumin: 3.7 g/dL (ref 3.5–5.0)
Alkaline Phosphatase: 114 U/L (ref 38–126)
Anion gap: 6 (ref 5–15)
BUN: 16 mg/dL (ref 8–23)
CO2: 26 mmol/L (ref 22–32)
Calcium: 8.7 mg/dL — ABNORMAL LOW (ref 8.9–10.3)
Chloride: 101 mmol/L (ref 98–111)
Creatinine, Ser: 0.7 mg/dL (ref 0.44–1.00)
GFR, Estimated: >60 mL/min (ref >60)
Glucose, Bld: 141 mg/dL — ABNORMAL HIGH (ref 70–99)
Potassium: 3.9 mmol/L (ref 3.5–5.1)
Sodium: 133 mmol/L — ABNORMAL LOW (ref 135–145)
Total Bilirubin: 0.7 mg/dL (ref 0.0–1.2)
Total Protein: 6.7 g/dL (ref 6.5–8.1)

## 2023-11-21 LAB — PROCALCITONIN: Procalcitonin: <0.1 ng/mL

## 2023-11-21 MED ORDER — BISOPROLOL FUMARATE 5 MG PO TABS
10.0000 mg | ORAL_TABLET | Freq: Every day | ORAL | Status: DC
  Administered 2023-11-21 – 2023-11-23 (×3): 10 mg via ORAL
  Filled 2023-11-21 (×3): qty 2

## 2023-11-21 MED ORDER — ACETAMINOPHEN 650 MG RE SUPP: 650.0000 mg | Freq: Four times a day (QID) | RECTAL | Status: DC | PRN

## 2023-11-21 MED ORDER — IRBESARTAN 300 MG PO TABS
300.0000 mg | ORAL_TABLET | Freq: Every day | ORAL | Status: DC
Start: 2023-11-21 — End: 2023-11-24
  Administered 2023-11-21 – 2023-11-24 (×4): 300 mg via ORAL
  Filled 2023-11-21 (×4): qty 1

## 2023-11-21 MED ORDER — SENNOSIDES-DOCUSATE SODIUM 8.6-50 MG PO TABS: 1.0000 | ORAL_TABLET | Freq: Every evening | ORAL | Status: DC | PRN

## 2023-11-21 MED ORDER — IPRATROPIUM BROMIDE 0.02 % IN SOLN
0.5000 mg | Freq: Three times a day (TID) | RESPIRATORY_TRACT | Status: DC
  Administered 2023-11-21 – 2023-11-22 (×4): 0.5 mg via RESPIRATORY_TRACT
  Filled 2023-11-21 (×4): qty 2.5

## 2023-11-21 MED ORDER — ROSUVASTATIN CALCIUM 20 MG PO TABS
20.0000 mg | ORAL_TABLET | Freq: Every day | ORAL | Status: DC
  Administered 2023-11-21 – 2023-11-24 (×4): 20 mg via ORAL
  Filled 2023-11-21 (×4): qty 1

## 2023-11-21 MED ORDER — HYDRALAZINE HCL 20 MG/ML IJ SOLN: 10.0000 mg | INTRAMUSCULAR | Status: DC | PRN

## 2023-11-21 MED ORDER — FUROSEMIDE 20 MG PO TABS
20.0000 mg | ORAL_TABLET | Freq: Two times a day (BID) | ORAL | Status: DC
  Administered 2023-11-21 – 2023-11-24 (×6): 20 mg via ORAL
  Filled 2023-11-21 (×6): qty 1

## 2023-11-21 MED ORDER — APIXABAN 5 MG PO TABS
5.0000 mg | ORAL_TABLET | Freq: Two times a day (BID) | ORAL | Status: DC
  Administered 2023-11-21 – 2023-11-24 (×7): 5 mg via ORAL
  Filled 2023-11-21 (×7): qty 1

## 2023-11-21 MED ORDER — DOFETILIDE 250 MCG PO CAPS
250.0000 ug | ORAL_CAPSULE | Freq: Two times a day (BID) | ORAL | Status: DC
  Administered 2023-11-21 – 2023-11-24 (×7): 250 ug via ORAL
  Filled 2023-11-21 (×7): qty 1

## 2023-11-21 MED ORDER — METHYLPREDNISOLONE SODIUM SUCC 40 MG IJ SOLR
40.0000 mg | Freq: Two times a day (BID) | INTRAMUSCULAR | Status: DC
  Administered 2023-11-21 – 2023-11-24 (×7): 40 mg via INTRAVENOUS
  Filled 2023-11-21 (×7): qty 1

## 2023-11-21 MED ORDER — ACETAMINOPHEN 325 MG PO TABS: 650.0000 mg | ORAL_TABLET | Freq: Four times a day (QID) | ORAL | Status: DC | PRN

## 2023-11-21 MED ORDER — IPRATROPIUM BROMIDE 0.02 % IN SOLN
0.5000 mg | Freq: Four times a day (QID) | RESPIRATORY_TRACT | Status: DC
  Administered 2023-11-21: 0.5 mg via RESPIRATORY_TRACT
  Filled 2023-11-21: qty 2.5

## 2023-11-21 MED ORDER — CLONAZEPAM 0.5 MG PO TABS
0.5000 mg | ORAL_TABLET | Freq: Every evening | ORAL | Status: DC | PRN
  Administered 2023-11-21: 0.5 mg via ORAL
  Filled 2023-11-21: qty 1

## 2023-11-21 MED ORDER — DOCUSATE SODIUM 100 MG PO CAPS
300.0000 mg | ORAL_CAPSULE | Freq: Every day | ORAL | Status: DC
  Administered 2023-11-22 – 2023-11-23 (×2): 300 mg via ORAL
  Filled 2023-11-21 (×3): qty 3

## 2023-11-21 MED ORDER — AMLODIPINE BESYLATE 5 MG PO TABS: 5.0000 mg | ORAL_TABLET | Freq: Every day | ORAL | Status: DC

## 2023-11-21 MED ORDER — ONDANSETRON HCL 4 MG PO TABS: 4.0000 mg | ORAL_TABLET | Freq: Four times a day (QID) | ORAL | Status: DC | PRN

## 2023-11-21 MED ORDER — PANTOPRAZOLE SODIUM 40 MG PO TBEC
40.0000 mg | DELAYED_RELEASE_TABLET | Freq: Every day | ORAL | Status: DC
Start: 2023-11-21 — End: 2023-11-24
  Administered 2023-11-21 – 2023-11-24 (×4): 40 mg via ORAL
  Filled 2023-11-21 (×4): qty 1

## 2023-11-21 MED ORDER — LEVALBUTEROL HCL 0.63 MG/3ML IN NEBU
0.6300 mg | INHALATION_SOLUTION | Freq: Three times a day (TID) | RESPIRATORY_TRACT | Status: DC
Start: 2023-11-21 — End: 2023-11-22
  Administered 2023-11-21 – 2023-11-22 (×2): 0.63 mg via RESPIRATORY_TRACT
  Filled 2023-11-21 (×2): qty 3

## 2023-11-21 MED ORDER — FUROSEMIDE 10 MG/ML IJ SOLN
20.0000 mg | Freq: Once | INTRAMUSCULAR | Status: AC
Start: 2023-11-21 — End: 2023-11-21
  Administered 2023-11-21: 20 mg via INTRAVENOUS
  Filled 2023-11-21: qty 2

## 2023-11-21 MED ORDER — ROPINIROLE HCL 1 MG PO TABS
0.5000 mg | ORAL_TABLET | Freq: Three times a day (TID) | ORAL | Status: DC
  Administered 2023-11-21 – 2023-11-24 (×10): 0.5 mg via ORAL
  Filled 2023-11-21 (×10): qty 1

## 2023-11-21 MED ORDER — GUAIFENESIN ER 600 MG PO TB12
600.0000 mg | ORAL_TABLET | Freq: Two times a day (BID) | ORAL | Status: DC
  Administered 2023-11-21 – 2023-11-24 (×8): 600 mg via ORAL
  Filled 2023-11-21 (×8): qty 1

## 2023-11-21 MED ORDER — LEVALBUTEROL HCL 0.63 MG/3ML IN NEBU
0.6300 mg | INHALATION_SOLUTION | Freq: Four times a day (QID) | RESPIRATORY_TRACT | Status: DC
  Administered 2023-11-21: 0.63 mg via RESPIRATORY_TRACT
  Filled 2023-11-21: qty 3

## 2023-11-21 MED ORDER — SODIUM CHLORIDE 0.9 % IV SOLN
500.0000 mg | INTRAVENOUS | Status: DC
  Administered 2023-11-22: 500 mg via INTRAVENOUS
  Filled 2023-11-21 (×2): qty 5

## 2023-11-21 MED ORDER — LEVALBUTEROL HCL 0.63 MG/3ML IN NEBU
0.6300 mg | INHALATION_SOLUTION | Freq: Three times a day (TID) | RESPIRATORY_TRACT | Status: DC
  Administered 2023-11-21: 0.63 mg via RESPIRATORY_TRACT
  Filled 2023-11-21: qty 3

## 2023-11-21 MED ORDER — POLYETHYLENE GLYCOL 3350 17 G PO PACK
17.0000 g | PACK | Freq: Every day | ORAL | Status: DC
  Administered 2023-11-23: 17 g via ORAL
  Filled 2023-11-21 (×3): qty 1

## 2023-11-21 MED ORDER — FUROSEMIDE 10 MG/ML IJ SOLN: 20.0000 mg | Freq: Two times a day (BID) | INTRAMUSCULAR | Status: DC

## 2023-11-21 MED ORDER — METOPROLOL TARTRATE 5 MG/5ML IV SOLN
5.0000 mg | INTRAVENOUS | Status: DC | PRN
  Filled 2023-11-21 (×2): qty 5

## 2023-11-21 MED ORDER — GUAIFENESIN 100 MG/5ML PO LIQD: 5.0000 mL | ORAL | Status: DC | PRN

## 2023-11-21 MED ORDER — IPRATROPIUM-ALBUTEROL 0.5-2.5 (3) MG/3ML IN SOLN: 3.0000 mL | RESPIRATORY_TRACT | Status: DC | PRN

## 2023-11-21 MED ORDER — ONDANSETRON HCL 4 MG/2ML IJ SOLN: 4.0000 mg | Freq: Four times a day (QID) | INTRAMUSCULAR | Status: DC | PRN

## 2023-11-21 NOTE — Evaluation (Signed)
Physical Therapy Evaluation Patient Details Name: Holly Benson MRN: [id number] DOB: [birth year] Today's Date: 11/21/2023  History of Present Illness  89 year old with history of non-small cell lung cancer on radiation, A-fib, COPD, HTN, diastolic CHF with EF 65%, HLD, OSA on CPAP, pulmonary hypertension comes to the ED with shortness of breath and cough.  Initially noted to be hypoxic.  CTA chest showed multiple new nodules and previously known nodule.  Admitted with concerns of COPD exacerbation  Clinical Impression  Pt admitted with above diagnosis.  Pt agreeable to PT, lives with dtr however is quite independent at her baseline. Pt amb ~ 50', CGA and reports her LEs feel 'weak".  SpO2=88-91% on RA during activity. O2 dropped to 87% once pt returned to sitting. O2 replaced at 1.5L as on arrival  and RN aware. HHPT vs no f/u pending progress.  Pt currently with functional limitations due to the deficits listed below (see PT Problem List). Pt will benefit from acute skilled PT to increase their independence and safety with mobility to allow discharge.           If plan is discharge home, recommend the following: A little help with bathing/dressing/bathroom;Help with stairs or ramp for entrance;Assistance with cooking/housework   Can travel by private vehicle        Equipment Recommendations None recommended by PT  Recommendations for Other Services       Functional Status Assessment Patient has had a recent decline in their functional status and demonstrates the ability to make significant improvements in function in a reasonable and predictable amount of time.     Precautions / Restrictions Precautions Precautions: Fall Restrictions Weight Bearing Restrictions Per Provider Order: No      Mobility  Bed Mobility               General bed mobility comments: NT pt in recliner    Transfers Overall transfer level: Needs assistance Equipment used: None Transfers:  Sit to/from Stand Sit to Stand: Contact guard assist           General transfer comment: for safety and line management    Ambulation/Gait Ambulation/Gait assistance: Contact guard assist, Min assist Gait Distance (Feet): 50 Feet Assistive device: None Gait Pattern/deviations: Step-through pattern, Decreased stride length       General Gait Details: therapeutic standing rest after  d/t fatigue, SpO2=88-91% on RA. O2 dropped to 87% once pt returned to sitting in room. O2 replaced at 1.5L as on arrival  Stairs            Wheelchair Mobility     Tilt Bed    Modified Rankin (Stroke Patients Only)       Balance Overall balance assessment: Needs assistance Sitting-balance support: No upper extremity supported, Feet supported Sitting balance-Leahy Scale: Normal     Standing balance support: During functional activity, No upper extremity supported Standing balance-Leahy Scale: Fair Standing balance comment: Fair+                             Pertinent Vitals/Pain Pain Assessment Pain Assessment: No/denies pain    Home Living Family/patient expects to be discharged to:: Private residence Living Arrangements: Children Available Help at Discharge: Family Type of Home: House Home Access: Stairs to enter Entrance Stairs-Rails: Right Entrance Stairs-Number of Steps: 4 Alternate Level Stairs-Number of Steps: 14 Home Layout: Two level Home Equipment: Cane - single point      Prior Function Prior  Level of Function : Independent/Modified Independent;Driving             Mobility Comments: uses cane for longer distances and on uneven terrain       Extremity/Trunk Assessment   Upper Extremity Assessment Upper Extremity Assessment: Overall WFL for tasks assessed    Lower Extremity Assessment Lower Extremity Assessment: Overall WFL for tasks assessed       Communication   Communication Communication: No apparent difficulties  Cognition  Arousal: Alert Behavior During Therapy: WFL for tasks assessed/performed Overall Cognitive Status: Within Functional Limits for tasks assessed                                          General Comments      Exercises     Assessment/Plan    PT Assessment Patient needs continued PT services  PT Problem List Decreased activity tolerance;Decreased mobility;Cardiopulmonary status limiting activity       PT Treatment Interventions Gait training;Stair training;Functional mobility training;Therapeutic activities;Therapeutic exercise;Patient/family education    PT Goals (Current goals can be found in the Care Plan section)  Acute Rehab PT Goals PT Goal Formulation: With patient Time For Goal Achievement: 12/05/23 Potential to Achieve Goals: Good    Frequency Min 1X/week     Co-evaluation               AM-PAC PT "6 Clicks" Mobility  Outcome Measure Help needed turning from your back to your side while in a flat bed without using bedrails?: A Little Help needed moving from lying on your back to sitting on the side of a flat bed without using bedrails?: A Little Help needed moving to and from a bed to a chair (including a wheelchair)?: A Little Help needed standing up from a chair using your arms (e.g., wheelchair or bedside chair)?: A Little Help needed to walk in hospital room?: A Little Help needed climbing 3-5 steps with a railing? : A Little 6 Click Score: 18    End of Session Equipment Utilized During Treatment: Gait belt Activity Tolerance: Patient tolerated treatment well;Patient limited by fatigue Patient left: with call bell/phone within reach;in chair;with family/visitor present;with nursing/sitter in room   PT Visit Diagnosis: Other abnormalities of gait and mobility (R26.89)    Time: 1610-9604 PT Time Calculation (min) (ACUTE ONLY): 15 min   Charges:   PT Evaluation $PT Eval Low Complexity: 1 Low   PT General Charges $$ ACUTE PT VISIT:  1 Visit         Carrol Bondar, PT  Acute Rehab Dept [facility]) [phone]  11/21/2023   [facility] 11/21/2023, 3:40 PM

## 2023-11-21 NOTE — Progress Notes (Signed)
Patient HR fluctuating between low 80s and upper 140 during report on this patient. Off going RN reported that she reached out to attending physician, A.Amin because telemetry technician reported to her that patient went into AFIB with RVR. She was instructed to give PRN IV metoprolol. See her note below. Off going RN verbalized during report that she had given the IV metoprolol, patient states she received the IV metoprolol but MAR charting does not reflect that it had been given.   Reached out to on call NP, J.Daniels to make him aware of fluctuating HR and error in documentation, he instructed me to give the patient her schedule Zebeta early. Medication administered, see MAR.  Patients family member, Ruby, asked if I would contact the on call NP about  repeat potassium.

## 2023-11-21 NOTE — Plan of Care (Signed)

## 2023-11-21 NOTE — Progress Notes (Addendum)
PROGRESS NOTE    Holly Benson  JYN:[id number] DOB: November 24, 1932 DOA: 11/20/2023 PCP: Philip Aspen, Limmie Patricia, MD    Brief Narrative:  89 year old with history of non-small cell lung cancer on radiation, A-fib, COPD, HTN, diastolic CHF with EF 65%, HLD, OSA on CPAP, pulmonary hypertension comes to the ED with shortness of breath and cough.  Initially noted to be hypoxic.  CTA chest showed multiple new nodules and previously known nodule.  Admitted with concerns of COPD exacerbation   Assessment & Plan:  Principal Problem:   Acute hypoxemic respiratory failure (HCC) Active Problems:   COPD with acute exacerbation (HCC)   Essential hypertension   History of CAD (coronary artery disease)   History of lung cancer   Chronic diastolic CHF (congestive heart failure) (HCC)   Pulmonary hypertension (HCC)   Acute exacerbation of CHF (congestive heart failure) (HCC)    Acute hypoxic respiratory failure COPD exacerbation CTA chest concerning for new pulmonary nodules including previously known nodules.  BNP is also slightly elevated. - Continue empiric azithromycin, steroid, bronchodilator.  I-S/flutter valve - Will give Lasix additional Lasix on top of p.o.   History of non-small cell lung cancer on radiation therapy Existing and new pulmonary nodules noted on the CTA chest -Follows outpatient radiation oncology, Dr. Peri Jefferson    Acute diastolic CHF exacerbation with preserved EF 65% History of pulmonary hypertension Essential hypertension We will continue gentle diuresis and further provide diuretics as needed.  Echocardiogram ordered.  Monitor urine output.  Will give additional Lasix 20 mg IV today. -Continue antihypertensives.  IV as needed medications    History of atrial fibrillation Continue home bisoprolol, Tikosyn, Eliquis   Restless leg syndrome -Continue ropinirole 0.5 mg 3 times daily   Hyperlipidemia - Continue Crestor 20 mg daily   Generalized anxiety  disorder -Continue clonazepam 0.5 at bedtime as needed.   DVT prophylaxis: Eliquis    Code Status: Full Code Family Communication:  called Betsy. Also met with family at bedside.  Status is: Inpatient Remains inpatient appropriate because: Hopefully home in next 24 hours   Subjective:  Some cough and congestion. Sitting up in the recliner  Examination:  General exam: Appears calm and comfortable  Respiratory system: Some rhonchi heard Cardiovascular system: S1 & S2 heard, RRR. No JVD, murmurs, rubs, gallops or clicks. No pedal edema. Gastrointestinal system: Abdomen is nondistended, soft and nontender. No organomegaly or masses felt. Normal bowel sounds heard. Central nervous system: Alert and oriented. No focal neurological deficits. Extremities: Symmetric 5 x 5 power. Skin: No rashes, lesions or ulcers Psychiatry: Judgement and insight appear normal. Mood & affect appropriate.                Diet Orders (From admission, onward)     Start     Ordered   11/21/23 0236  Diet Heart Room service appropriate? Yes; Fluid consistency: Thin; Fluid restriction: 2000 mL Fluid  Diet effective now       Comments: Salt striction 2 g/day  Question Answer Comment  Room service appropriate? Yes   Fluid consistency: Thin   Fluid restriction: 2000 mL Fluid      11/21/23 0235            Objective: Vitals:   11/21/23 0055 11/21/23 0232 11/21/23 0533 11/21/23 0929  BP: 124/60  137/70 (!) 145/61  Pulse: 68  (!) 55 85  Resp: 18  18 20   Temp: 97.8 F (36.6 C)  98.3 F (36.8 C) 98 F (36.7 C)  TempSrc: Oral  Oral Oral  SpO2: 97% 97% 93% 92%  Weight:   66.2 kg   Height:        Intake/Output Summary (Last 24 hours) at 11/21/2023 1238 Last data filed at 11/21/2023 1200 Gross per 24 hour  Intake 610 ml  Output 3000 ml  Net -2390 ml   Filed Weights   11/20/23 1526 11/21/23 0533  Weight: 65.6 kg 66.2 kg    Scheduled Meds:  apixaban  5 mg Oral BID   bisoprolol  10  mg Oral QHS   clonazePAM  0.5 mg Oral QHS   docusate sodium  300 mg Oral QHS   dofetilide  250 mcg Oral BID   furosemide  20 mg Intravenous Once   furosemide  20 mg Oral BID   guaiFENesin  600 mg Oral BID   ipratropium  0.5 mg Nebulization TID   irbesartan  300 mg Oral Daily   levalbuterol  0.63 mg Nebulization Q8H   methylPREDNISolone (SOLU-MEDROL) injection  40 mg Intravenous Q12H   pantoprazole  40 mg Oral Daily   polyethylene glycol  17 g Oral QHS   rOPINIRole  0.5 mg Oral TID   rosuvastatin  20 mg Oral Daily   Continuous Infusions:  azithromycin      Nutritional status     Body mass index is 26.07 kg/m.  Data Reviewed:   CBC: Recent Labs  Lab 11/20/23 1552 11/21/23 0820  WBC 8.2 7.0  NEUTROABS 5.0  --   HGB 13.1 12.2  HCT 39.4 37.1  MCV 98.3 100.0  PLT 284 280   Basic Metabolic Panel: Recent Labs  Lab 11/20/23 1552 11/21/23 0820  NA 136 133*  K 4.5 3.9  CL 102 101  CO2 25 26  GLUCOSE 100* 141*  BUN 18 16  CREATININE 0.71 0.70  CALCIUM 9.6 8.7*   GFR: Estimated Creatinine Clearance: 44.2 mL/min (by C-G formula based on SCr of 0.7 mg/dL). Liver Function Tests: Recent Labs  Lab 11/21/23 0820  AST 21  ALT 24  ALKPHOS 114  BILITOT 0.7  PROT 6.7  ALBUMIN 3.7   No results for input(s): "LIPASE", "AMYLASE" in the last 168 hours. No results for input(s): "AMMONIA" in the last 168 hours. Coagulation Profile: No results for input(s): "INR", "PROTIME" in the last 168 hours. Cardiac Enzymes: No results for input(s): "CKTOTAL", "CKMB", "CKMBINDEX", "TROPONINI" in the last 168 hours. BNP (last 3 results) No results for input(s): "PROBNP" in the last 8760 hours. HbA1C: No results for input(s): "HGBA1C" in the last 72 hours. CBG: No results for input(s): "GLUCAP" in the last 168 hours. Lipid Profile: No results for input(s): "CHOL", "HDL", "LDLCALC", "TRIG", "CHOLHDL", "LDLDIRECT" in the last 72 hours. Thyroid Function Tests: No results for  input(s): "TSH", "T4TOTAL", "FREET4", "T3FREE", "THYROIDAB" in the last 72 hours. Anemia Panel: No results for input(s): "VITAMINB12", "FOLATE", "FERRITIN", "TIBC", "IRON", "RETICCTPCT" in the last 72 hours. Sepsis Labs: Recent Labs  Lab 11/21/23 0820  PROCALCITON <0.10    Recent Results (from the past 240 hours)  Resp panel by RT-PCR (RSV, Flu A&B, Covid) Anterior Nasal Swab     Status: None   Collection Time: 11/20/23  3:31 PM   Specimen: Anterior Nasal Swab  Result Value Ref Range Status   SARS Coronavirus 2 by RT PCR NEGATIVE NEGATIVE Final    Comment: (NOTE) SARS-CoV-2 target nucleic acids are NOT DETECTED.  The SARS-CoV-2 RNA is generally detectable in upper respiratory specimens during the acute phase of infection.  The lowest concentration of SARS-CoV-2 viral copies this assay can detect is 138 copies/mL. A negative result does not preclude SARS-Cov-2 infection and should not be used as the sole basis for treatment or other patient management decisions. A negative result may occur with  improper specimen collection/handling, submission of specimen other than nasopharyngeal swab, presence of viral mutation(s) within the areas targeted by this assay, and inadequate number of viral copies(<138 copies/mL). A negative result must be combined with clinical observations, patient history, and epidemiological information. The expected result is Negative.  Fact Sheet for Patients:  BloggerCourse.com  Fact Sheet for Healthcare Providers:  SeriousBroker.it  This test is no t yet approved or cleared by the [place] FDA and  has been authorized for detection and/or diagnosis of SARS-CoV-2 by FDA under an Emergency Use Authorization (EUA). This EUA will remain  in effect (meaning this test can be used) for the duration of the COVID-19 declaration under Section 564(b)(1) of the Act, 21 U.S.C.section 360bbb-3(b)(1), unless the  authorization is terminated  or revoked sooner.       Influenza A by PCR NEGATIVE NEGATIVE Final   Influenza B by PCR NEGATIVE NEGATIVE Final    Comment: (NOTE) The Xpert Xpress SARS-CoV-2/FLU/RSV plus assay is intended as an aid in the diagnosis of influenza from Nasopharyngeal swab specimens and should not be used as a sole basis for treatment. Nasal washings and aspirates are unacceptable for Xpert Xpress SARS-CoV-2/FLU/RSV testing.  Fact Sheet for Patients: BloggerCourse.com  Fact Sheet for Healthcare Providers: SeriousBroker.it  This test is not yet approved or cleared by the [place] FDA and has been authorized for detection and/or diagnosis of SARS-CoV-2 by FDA under an Emergency Use Authorization (EUA). This EUA will remain in effect (meaning this test can be used) for the duration of the COVID-19 declaration under Section 564(b)(1) of the Act, 21 U.S.C. section 360bbb-3(b)(1), unless the authorization is terminated or revoked.     Resp Syncytial Virus by PCR NEGATIVE NEGATIVE Final    Comment: (NOTE) Fact Sheet for Patients: BloggerCourse.com  Fact Sheet for Healthcare Providers: SeriousBroker.it  This test is not yet approved or cleared by the [place] FDA and has been authorized for detection and/or diagnosis of SARS-CoV-2 by FDA under an Emergency Use Authorization (EUA). This EUA will remain in effect (meaning this test can be used) for the duration of the COVID-19 declaration under Section 564(b)(1) of the Act, 21 U.S.C. section 360bbb-3(b)(1), unless the authorization is terminated or revoked.  Performed at [facility], [address], [place], [place] [address]          Radiology Studies: CT Angio Chest PE W and/or Wo Contrast Result Date: 11/20/2023 CLINICAL DATA:  High probability pulmonary embolism, dyspnea, lung  cancer. * Tracking Code: BO * EXAM: CT ANGIOGRAPHY CHEST WITH CONTRAST TECHNIQUE: Multidetector CT imaging of the chest was performed using the standard protocol during bolus administration of intravenous contrast. Multiplanar CT image reconstructions and MIPs were obtained to evaluate the vascular anatomy. RADIATION DOSE REDUCTION: This exam was performed according to the departmental dose-optimization program which includes automated exposure control, adjustment of the mA and/or kV according to patient size and/or use of iterative reconstruction technique. CONTRAST:  75mL OMNIPAQUE IOHEXOL 350 MG/ML SOLN COMPARISON:  09/18/2023 FINDINGS: Cardiovascular: Extensive multi-vessel coronary artery calcification. Global cardiac size is enlarged with biatrial enlargement noted. No pericardial effusion. The central pulmonary arteries are of normal caliber. There is no intraluminal filling defect identified to suggest acute pulmonary embolism.  Extensive atherosclerotic calcification within the thoracic aorta. No aortic aneurysm. Mediastinum/Nodes: No enlarged mediastinal, hilar, or axillary lymph nodes. Thyroid gland, trachea, and esophagus demonstrate no significant findings. Lungs/Pleura: 11 mm right apical nodule, noted to be hypermetabolic on 08/13/2023 and compatible with patient's known primary bronchogenic malignancy appears stable. Interval development of surrounding architectural distortion with inter and intra lobular septal thickening at the right apex may reflect changes of radiation therapy. Interval development of multiple nodules within the left upper lobe measuring up to 13 x 18 mm axial # 62/6, nonspecific, possibly infectious though development of multiple intra pulmonary metastases is not excluded. The latter is considered less likely, however, given its relatively isolated distribution within the left upper lobe only. Superimposed mild emphysema. Small bilateral pleural effusions have developed. No  pneumothorax. No central obstructing lesion. Upper Abdomen: No acute abnormality. Musculoskeletal: No acute bone abnormality. No lytic or blastic bone lesion. Review of the MIP images confirms the above findings. IMPRESSION: 1. No pulmonary embolism. 2. Stable 11 mm right apical nodule, noted to be hypermetabolic on 08/13/2023 and compatible with patient's known primary bronchogenic malignancy. Interval development of surrounding architectural distortion with inter and intra lobular septal thickening at the right apex may reflect changes of radiation therapy. 3. Interval development of multiple nodules within the left upper lobe measuring up to 13 x 18 mm, nonspecific, possibly infectious though development of multiple intra pulmonary metastases is not excluded. The latter is considered less likely, given its relatively isolated distribution within the left upper lobe only. Short-term follow-up imaging at 3 months would be helpful in differentiating these entities 4. Interval development of small bilateral pleural effusions. 5. Extensive multi-vessel coronary artery calcification. 6. Mild edema Aortic Atherosclerosis (ICD10-I70.0) and Emphysema (ICD10-J43.9). Electronically Signed   By: Helyn Numbers M.D.   On: 11/20/2023 21:17   DG Chest Port 1 View Result Date: 11/20/2023 CLINICAL DATA:  Difficulty breathing. EXAM: PORTABLE CHEST 1 VIEW COMPARISON:  01/31/2021. FINDINGS: There are chronic nonspecific opacities throughout bilateral lungs including bilateral lung apices, favored to represent fibrosis/scarring. Bilateral lung fields are otherwise clear. No acute consolidation or lung collapse. Bilateral costophrenic angles are clear. Stable cardio-mediastinal silhouette. No acute osseous abnormalities. Right proximal humerus metallic hardware noted. The soft tissues are within normal limits. IMPRESSION: *No acute cardiopulmonary abnormality. Electronically Signed   By: Jules Schick M.D.   On: 11/20/2023 16:37            LOS: 0 days   Time spent= 35 mins    Miguel Rota, MD [facility]  If 7PM-7AM, please contact night-coverage  11/21/2023, 12:38 PM

## 2023-11-21 NOTE — Plan of Care (Signed)

## 2023-11-21 NOTE — Progress Notes (Signed)
Echocardiogram 2D Echocardiogram has been performed.  Holly Benson N Bexley Mclester,RDCS 11/21/2023, 9:30 AM

## 2023-11-21 NOTE — H&P (Signed)
History and Physical    Holly Benson GEX:[id number] DOB: 1933-07-02 DOA: 11/20/2023  PCP: Philip Aspen, Limmie Patricia, MD   Patient coming from: Home   Chief Complaint:  Chief Complaint  Patient presents with   Cough   Shortness of Breath   ED TRIAGE note:Hx of lung CA. Has Upper respiratory infection and seen by pulmonary this am and told to come to ER due to low pulse ox. Becomes SOB with ambulation. SOB on walking to triage. Sat 88% and rise to 92% with sitting. Slight labored breathing. Congested cough   HPI:  89 year old female with history of non-small cell lung cancer on radiation therapy, A-fib, COPD, hypertension, diastolic heart failure with preserved EF 60 to 65%, hyperlipidemia, OSA on CPAP and pulmonary hypertension presented to ED with shortness of breath and cough.  Recently prescribed Z-Pak by PCP.  Oxygen saturation 83% on room air with ambulation, placed on 3 L [place].    Initial presentation to ED patient found to elevated blood pressure 171/63, O2 sat 88% room air which improved to 98% with nasal cannula otherwise hemodynamically stable.  Afebrile. Respiratory panel negative for COVID, flu and RSV. BMP unremarkable. CBC unremarkable. Elevated BNP 797. Troponin x 2 negative. EKG showed -Sinus rhythm with Premature atrial complexes with Abberant conduction and heart rate 74. Chest x-ray no acute cardiopulmonary abnormality.  CTA chest "IMPRESSION:  1. No pulmonary embolism.  2. Stable 11 mm right apical nodule, noted to be hypermetabolic on  08/13/2023 and compatible with patient's known primary bronchogenic  malignancy. Interval development of surrounding architectural  distortion with inter and intra lobular septal thickening at the  right apex may reflect changes of radiation therapy.  3. Interval development of multiple nodules within the left upper  lobe measuring up to 13 x 18 mm, nonspecific, possibly infectious  though development of multiple intra  pulmonary metastases is not  excluded. The latter is considered less likely, given its relatively  isolated distribution within the left upper lobe only. Short-term  follow-up imaging at 3 months would be helpful in differentiating  these entities  4. Interval development of small bilateral pleural effusions.  5. Extensive multi-vessel coronary artery calcification.  6. Mild edema"   In the ED, Patient was given IV Lasix 40 mg, DuoNeb treatments, and Solu-Medrol 125 mg.  Patient was also given ceftriaxone azithromycin.  Patient has been transferred to [facility] for management of COPD exacerbation, acute hypoxic respiratory failure in the evaluation for dyspnea and cough.  Patient has been transferred to [facility] for management of COPD exacerbation, acute hypoxic respiratory failure in the evaluation for dyspnea and cough.  During my evaluation at the bedside patient reported that for last 1 to 2 weeks she has been noticing worsening shortness of breath which has been progressive getting worse for last 1 week.  Patient reported she ran out of Lasix for 1 week and noticed bilateral lower extremity swelling as well.  At home patient is using Ventolin inhaler without improvement of shortness of breath.  Reported that patient O2 sat dropped to 83% on ambulation while in the ED as well as at home as well.  Patient is endorsing nonproductive cough.  Denies any fever and chills.  Patient denies any chest pain and palpitation.  Denies any orthopnea and paroxysmal nocturnal dyspnea.  No other complaint at this time.  Significant labs in the ED: Lab Orders         Resp panel by RT-PCR (RSV, Flu  A&B, Covid) Anterior Nasal Swab         Basic metabolic panel         CBC with Differential         Brain natriuretic peptide         Comprehensive metabolic panel         CBC         Procalcitonin       Review of Systems:  Review of Systems  Constitutional:  Negative for chills,  fever, malaise/fatigue and weight loss.  HENT:  Positive for hearing loss.   Eyes:  Negative for blurred vision.  Respiratory:  Positive for cough, sputum production and shortness of breath.   Cardiovascular:  Positive for leg swelling. Negative for chest pain, palpitations and orthopnea.  Gastrointestinal:  Negative for abdominal pain, heartburn, nausea and vomiting.  Musculoskeletal:  Negative for myalgias.  Skin:  Negative for rash.  Neurological:  Negative for dizziness and headaches.  Psychiatric/Behavioral:  The patient is not nervous/anxious.   All other systems reviewed and are negative.   Past Medical History:  Diagnosis Date   Arrhythmia    Arthritis    Atrial fibrillation (HCC)    Constipation    chronic   COPD (chronic obstructive pulmonary disease) (HCC)    History of radiation therapy    Right lung- 09/22/23-09/29/23- Dr. Antony Blackbird   Hyperlipidemia    Hypertension    pulmonary   OSA on CPAP    Osteoarthritis    Osteoporosis    Peripheral neuropathy    Pulmonary hypertension (HCC)    RLS (restless legs syndrome)    Sleep apnea     Past Surgical History:  Procedure Laterality Date   ABDOMINAL AORTOGRAM W/LOWER EXTREMITY N/A 03/21/2020   Procedure: ABDOMINAL AORTOGRAM W/ Bilateral LOWER EXTREMITY [place];  Surgeon: Iran Ouch, MD;  Location: [facility] INVASIVE CV LAB;  Service: Cardiovascular;  Laterality: N/A;   ABDOMINAL HYSTERECTOMY Bilateral    arm surgery Right    Broken arm and has a plate in it   CARDIOVERSION N/A 03/12/2023   Procedure: CARDIOVERSION;  Surgeon: Quintella Reichert, MD;  Location: [facility] INVASIVE CV LAB;  Service: Cardiovascular;  Laterality: N/A;   CATARACT EXTRACTION Bilateral    COLONOSCOPY     More than 10 years ago [facility]   HERNIA REPAIR  08/2022   KNEE ARTHROSCOPY Right    PERIPHERAL VASCULAR INTERVENTION Bilateral 03/21/2020   Procedure: PERIPHERAL VASCULAR INTERVENTION;  Surgeon: Iran Ouch, MD;  Location: [facility]  INVASIVE CV LAB;  Service: Cardiovascular;  Laterality: Bilateral;  external iliac   TONSILLECTOMY     ventral herniorrhaphy  2023   mesh used     reports that she quit smoking about 32 years ago. Her smoking use included cigarettes. She started smoking about 69 years ago. She has a 55.5 pack-year smoking history. She has never used smokeless tobacco. She reports current alcohol use of about 7.0 standard drinks of alcohol per week. She reports that she does not use drugs.  Allergies  Allergen Reactions   Lovenox [Enoxaparin Sodium] Hives and Rash    PT STATES SHE BROKE OUT IN A RASH HEAD TO TOE AND LASTED ABOUT 3 WEEKS    Metoprolol Succinate [Metoprolol] Other (See Comments)    Muscle aches, hand pain, and tingling    Family History  Problem Relation Age of Onset   Suicidality Father    Stroke Maternal Grandfather    Hypertension Sister  Colon cancer Neg Hx    Esophageal cancer Neg Hx    Pancreatic cancer Neg Hx    Stomach cancer Neg Hx    Liver disease Neg Hx     Prior to Admission medications   Medication Sig Start Date End Date Taking? Authorizing Provider  acetaminophen (TYLENOL) 325 MG tablet Take 650 mg by mouth as needed for moderate pain or headache.    [provider]  albuterol (VENTOLIN HFA) 108 (90 Base) MCG/ACT inhaler INHALE TWO PUFFS BY MOUTH EVERY 6 HOURS AS NEEDED FOR WHEEZING OR FOR SHORTNESS OF BREATH 04/20/23   Parrett, Tammy S, NP  amLODipine (NORVASC) 5 MG tablet Take 1 tablet (5 mg total) by mouth daily. 02/17/23 10/15/23  Ronney Asters, NP  apixaban (ELIQUIS) 5 MG TABS tablet Take 1 tablet (5 mg total) by mouth 2 (two) times daily. 07/24/23   Camnitz, Andree Coss, MD  azithromycin (ZITHROMAX) 250 MG tablet Take 2 tablets on day 1, then 1 tablet daily on days 2 through 5 11/17/23 11/22/23  Philip Aspen, Limmie Patricia, MD  benzonatate (TESSALON) 100 MG capsule Take 1 capsule (100 mg total) by mouth 2 (two) times daily as needed for cough. 11/17/23    Philip Aspen, Limmie Patricia, MD  bisoprolol (ZEBETA) 10 MG tablet TAKE 1 TABLET BY MOUTH AT BEDTIME 09/14/23   Hilty, Lisette Abu, MD  Cholecalciferol (VITAMIN D3 MAXIMUM STRENGTH) 125 MCG (5000 UT) capsule Take 5,000 Units by mouth daily.    [provider]  clonazePAM (KLONOPIN) 0.5 MG tablet Take 0.5 tablets (0.25 mg total) by mouth at bedtime as needed for anxiety. Patient taking differently: Take 0.25 mg by mouth at bedtime as needed (Restless leg). 02/12/23   Dohmeier, Porfirio Mylar, MD  docusate sodium (COLACE) 100 MG capsule Take 300 mg by mouth at bedtime.    [provider]  dofetilide (TIKOSYN) 250 MCG capsule TAKE 1 CAPSULE BY MOUTH 2 TIMES A DAY. 07/09/23   Sheilah Pigeon, PA-C  furosemide (LASIX) 20 MG tablet TAKE 1 TABLET BY MOUTH ON MONDAY, WEDNESDAY, AND FRIDAY. TAKE 2 TABLETS BY MOUTH ON ALL OTHER DAYS. 10/19/23   Philip Aspen, Limmie Patricia, MD  irbesartan (AVAPRO) 300 MG tablet TAKE 1 TABLET BY MOUTH DAILY 11/18/23   Hilty, Lisette Abu, MD  Misc Natural Products (COLON CLEANSE) CAPS Take 4 capsules by mouth as needed (constipation).    [provider]  Multiple Vitamins-Minerals (PRESERVISION AREDS 2) CAPS Take 1 tablet by mouth 2 (two) times daily. 08/11/22   [provider]  polyethylene glycol (MIRALAX / GLYCOLAX) 17 g packet Take 17 g by mouth at bedtime.    [provider]  PRESCRIPTION MEDICATION at bedtime as needed (Sometimes). CPAP    [provider]  rOPINIRole (REQUIP) 0.5 MG tablet One in AM and one after 3 Pm and one after dinner 8 Pm Patient taking differently: Take 0.5 mg by mouth in the morning, at noon, in the evening, and at bedtime. 02/12/23   Dohmeier, Porfirio Mylar, MD  rosuvastatin (CRESTOR) 20 MG tablet Take 1 tablet (20 mg total) by mouth daily. 09/25/23 12/24/23  Chrystie Nose, MD  trimethoprim-polymyxin b (POLYTRIM) ophthalmic solution Place 1 drop into both eyes every 4 (four) hours. 09/23/23   Deeann Saint, MD      Physical Exam: Vitals:   11/21/23 0000 11/21/23 0004 11/21/23 0055 11/21/23 0232  BP: (!) 123/57  124/60   Pulse: 65  68   Resp: (!) 27  18  Temp:  98.1 F (36.7 C) 97.8 F (36.6 C)   TempSrc:   Oral   SpO2: 96%  97% 97%  Weight:      Height:        Physical Exam Constitutional:      General: She is not in acute distress.    Appearance: She is not ill-appearing.  Cardiovascular:     Rate and Rhythm: Normal rate and regular rhythm.  Pulmonary:     Effort: Pulmonary effort is normal.     Breath sounds: Wheezing present. No decreased breath sounds, rhonchi or rales.  Musculoskeletal:     Cervical back: Normal range of motion and neck supple.     Right lower leg: Edema present.     Left lower leg: Edema present.     Comments: Trace bilateral lower extremities edema  Skin:    General: Skin is warm.     Capillary Refill: Capillary refill takes less than 2 seconds.  Neurological:     Mental Status: She is alert and oriented to person, place, and time.  Psychiatric:        Mood and Affect: Mood normal.        Behavior: Behavior is not agitated.      Labs on Admission: I have personally reviewed following labs and imaging studies  CBC: Recent Labs  Lab 11/20/23 1552  WBC 8.2  NEUTROABS 5.0  HGB 13.1  HCT 39.4  MCV 98.3  PLT 284   Basic Metabolic Panel: Recent Labs  Lab 11/20/23 1552  NA 136  K 4.5  CL 102  CO2 25  GLUCOSE 100*  BUN 18  CREATININE 0.71  CALCIUM 9.6   GFR: Estimated Creatinine Clearance: 44 mL/min (by C-G formula based on SCr of 0.71 mg/dL). Liver Function Tests: No results for input(s): "AST", "ALT", "ALKPHOS", "BILITOT", "PROT", "ALBUMIN" in the last 168 hours. No results for input(s): "LIPASE", "AMYLASE" in the last 168 hours. No results for input(s): "AMMONIA" in the last 168 hours. Coagulation Profile: No results for input(s): "INR", "PROTIME" in the last 168 hours. Cardiac Enzymes: Recent Labs  Lab 11/20/23 1552  11/20/23 1859  TROPONINIHS 8 8   BNP (last 3 results) Recent Labs    03/24/23 1445 11/20/23 1552  BNP 891.4* 796.9*   HbA1C: No results for input(s): "HGBA1C" in the last 72 hours. CBG: No results for input(s): "GLUCAP" in the last 168 hours. Lipid Profile: No results for input(s): "CHOL", "HDL", "LDLCALC", "TRIG", "CHOLHDL", "LDLDIRECT" in the last 72 hours. Thyroid Function Tests: No results for input(s): "TSH", "T4TOTAL", "FREET4", "T3FREE", "THYROIDAB" in the last 72 hours. Anemia Panel: No results for input(s): "VITAMINB12", "FOLATE", "FERRITIN", "TIBC", "IRON", "RETICCTPCT" in the last 72 hours. Urine analysis:    Component Value Date/Time   COLORURINE YELLOW 09/02/2021 1230   APPEARANCEUR HAZY (A) 09/02/2021 1230   LABSPEC 1.022 09/02/2021 1230   PHURINE 7.5 09/02/2021 1230   GLUCOSEU NEGATIVE 09/02/2021 1230   HGBUR NEGATIVE 09/02/2021 1230   BILIRUBINUR NEGATIVE 09/02/2021 1230   KETONESUR NEGATIVE 09/02/2021 1230   PROTEINUR TRACE (A) 09/02/2021 1230   NITRITE POSITIVE (A) 09/02/2021 1230   LEUKOCYTESUR SMALL (A) 09/02/2021 1230    Radiological Exams on Admission: I have personally reviewed images CT Angio Chest PE W and/or Wo Contrast Result Date: 11/20/2023 CLINICAL DATA:  High probability pulmonary embolism, dyspnea, lung cancer. * Tracking Code: BO * EXAM: CT ANGIOGRAPHY CHEST WITH CONTRAST TECHNIQUE: Multidetector CT imaging of the chest was performed using the standard  protocol during bolus administration of intravenous contrast. Multiplanar CT image reconstructions and MIPs were obtained to evaluate the vascular anatomy. RADIATION DOSE REDUCTION: This exam was performed according to the departmental dose-optimization program which includes automated exposure control, adjustment of the mA and/or kV according to patient size and/or use of iterative reconstruction technique. CONTRAST:  75mL OMNIPAQUE IOHEXOL 350 MG/ML SOLN COMPARISON:  09/18/2023 FINDINGS:  Cardiovascular: Extensive multi-vessel coronary artery calcification. Global cardiac size is enlarged with biatrial enlargement noted. No pericardial effusion. The central pulmonary arteries are of normal caliber. There is no intraluminal filling defect identified to suggest acute pulmonary embolism. Extensive atherosclerotic calcification within the thoracic aorta. No aortic aneurysm. Mediastinum/Nodes: No enlarged mediastinal, hilar, or axillary lymph nodes. Thyroid gland, trachea, and esophagus demonstrate no significant findings. Lungs/Pleura: 11 mm right apical nodule, noted to be hypermetabolic on 08/13/2023 and compatible with patient's known primary bronchogenic malignancy appears stable. Interval development of surrounding architectural distortion with inter and intra lobular septal thickening at the right apex may reflect changes of radiation therapy. Interval development of multiple nodules within the left upper lobe measuring up to 13 x 18 mm axial # 62/6, nonspecific, possibly infectious though development of multiple intra pulmonary metastases is not excluded. The latter is considered less likely, however, given its relatively isolated distribution within the left upper lobe only. Superimposed mild emphysema. Small bilateral pleural effusions have developed. No pneumothorax. No central obstructing lesion. Upper Abdomen: No acute abnormality. Musculoskeletal: No acute bone abnormality. No lytic or blastic bone lesion. Review of the MIP images confirms the above findings. IMPRESSION: 1. No pulmonary embolism. 2. Stable 11 mm right apical nodule, noted to be hypermetabolic on 08/13/2023 and compatible with patient's known primary bronchogenic malignancy. Interval development of surrounding architectural distortion with inter and intra lobular septal thickening at the right apex may reflect changes of radiation therapy. 3. Interval development of multiple nodules within the left upper lobe measuring up to  13 x 18 mm, nonspecific, possibly infectious though development of multiple intra pulmonary metastases is not excluded. The latter is considered less likely, given its relatively isolated distribution within the left upper lobe only. Short-term follow-up imaging at 3 months would be helpful in differentiating these entities 4. Interval development of small bilateral pleural effusions. 5. Extensive multi-vessel coronary artery calcification. 6. Mild edema Aortic Atherosclerosis (ICD10-I70.0) and Emphysema (ICD10-J43.9). Electronically Signed   By: Helyn Numbers M.D.   On: 11/20/2023 21:17   DG Chest Port 1 View Result Date: 11/20/2023 CLINICAL DATA:  Difficulty breathing. EXAM: PORTABLE CHEST 1 VIEW COMPARISON:  01/31/2021. FINDINGS: There are chronic nonspecific opacities throughout bilateral lungs including bilateral lung apices, favored to represent fibrosis/scarring. Bilateral lung fields are otherwise clear. No acute consolidation or lung collapse. Bilateral costophrenic angles are clear. Stable cardio-mediastinal silhouette. No acute osseous abnormalities. Right proximal humerus metallic hardware noted. The soft tissues are within normal limits. IMPRESSION: *No acute cardiopulmonary abnormality. Electronically Signed   By: Jules Schick M.D.   On: 11/20/2023 16:37     EKG: My personal interpretation of EKG shows: Sinus rhythm heart rate 74 and premature atrial complex.    Assessment/Plan: Principal Problem:   Acute hypoxemic respiratory failure (HCC) Active Problems:   COPD with acute exacerbation (HCC)   Essential hypertension   History of CAD (coronary artery disease)   History of lung cancer   Chronic diastolic CHF (congestive heart failure) (HCC)   Pulmonary hypertension (HCC)   Acute exacerbation of CHF (congestive heart failure) (HCC)  Assessment and Plan: Acute hypoxic respiratory failure COPD exacerbation -Patient presented to emergency department complaining of shortness  of breath which is progressively getting worse on exertion.  Patient denies any fever and chill.  Reported productive cough.  Also endorsing body ache and increased fatigue.  With ambulation patient O2 sat dropped to 82%.  Currently O2 sat 97% on 2 L oxygen. -CXR no acute cardiopulmonary abnormality. - CTA chest no pulmonary embolism.  Interval development of small bilateral pleural effusion and mild edema. -In the ED patient received DuoNeb nebulizer treatment x 2, Solu-Medrol 125 mg, ceftriaxone 1 g, azithromycin 500 mg and Lasix 40 mg. -Low suspicion for pneumonia at this time.  Checking procalcitonin level.  Holding any antibiotic treatment for pneumonia. - Continue azithromycin for COPD exacerbation management. -Continue IV Solu-Medrol 40 mg twice daily - Continue Xopenex and ipratropium every 8 hours scheduled and DuoNeb as needed. - Continue supplemental oxygen, supportive care.   History of non-small cell lung cancer on radiation therapy - CTA chest finding following: 2. Stable 11 mm right apical nodule, noted to be hypermetabolic on 08/13/2023 and compatible with patient's known primary bronchogenic malignancy. Interval development of surrounding architectural distortion with inter and intra lobular septal thickening at the right apex may reflect changes of radiation therapy. 3. Interval development of multiple nodules within the left upper lobe measuring up to 13 x 18 mm, nonspecific, possibly infectious though development of multiple intra pulmonary metastases is not excluded. The latter is considered less likely, given its relatively isolated distribution within the left upper lobe only. Short-term follow-up imaging at 3 months would be helpful in differentiating these entities. -Follows radiation oncologist Dr. Roselind Messier outpatient.   Diastolic heart failure exacerbation Of diastolic heart failure with preserved EF 60 to 65% History of pulmonary hypertension -In ED patient  received Lasix 40 mg IV.  Physical exam currently showing trace bilateral lower extremity pitting edema. -Continue IV Lasix 20 mg twice daily for 1 day.  Will assess volume status on daily basis, monitor urine output and based on the volume status can adjust dose of diuretics. -Holding amlodipine and continue Aapro 300 mg daily. -Obtain echocardiogram - Continue cardiac monitoring   History of atrial fibrillation - EKG showing rate controlled.  Continue bisoprolol 10 mg at bedtime, dofetilide 250 mg twice daily, and Eliquis 5 mg twice daily.   Restless leg syndrome -Continue ropinirole 0.5 mg 3 times daily  Hyperlipidemia - Continue Crestor 20 mg daily  Generalized anxiety disorder -Continue clonazepam 0.5 at bedtime as needed.  DVT prophylaxis:  Eliquis Code Status:  Full Code Diet: Heart healthy diet Family Communication: None present Disposition Plan: Pending improvement of shortness of breath.  Continue to treat for COPD and CHF exacerbation.  Assess volume status on daily basis.  Tentative discharge to home in 2 to 3 days Consults: None at this moment Admission status:   Inpatient, Telemetry bed  Severity of Illness: The appropriate patient status for this patient is INPATIENT. Inpatient status is judged to be reasonable and necessary in order to provide the required intensity of service to ensure the patient's safety. The patient's presenting symptoms, physical exam findings, and initial radiographic and laboratory data in the context of their chronic comorbidities is felt to place them at high risk for further clinical deterioration. Furthermore, it is not anticipated that the patient will be medically stable for discharge from the hospital within 2 midnights of admission.   * I certify that at the point of admission it is  my clinical judgment that the patient will require inpatient hospital care spanning beyond 2 midnights from the point of admission due to high intensity of  service, high risk for further deterioration and high frequency of surveillance required.Marland Kitchen    Tereasa Coop, MD [facility]  How to contact the [facility] Attending or Consulting provider 7A - 7P or covering provider during after hours 7P -7A, for this patient.  Check the care team in [facility] and look for a) attending/consulting [facility] provider listed and b) the [facility] team listed Log into [email or web address] and use [place]'s universal password to access. If you do not have the password, please contact the hospital operator. Locate the [facility] provider you are looking for under [facility] and page to a number that you can be directly reached. If you still have difficulty reaching the provider, please page the [facility] (Director on Call) for the Hospitalists listed on amion for assistance.  11/21/2023, 4:03 AM

## 2023-11-21 NOTE — Progress Notes (Signed)
   11/21/23 1836  Assess: MEWS Score  Temp 97.7 F (36.5 C)  BP 135/74  MAP (mmHg) 90  ECG Heart Rate (!) 152  Resp 16  SpO2 95 %  O2 Device Nasal Cannula  O2 Flow Rate (L/min) 2 L/min  Assess: MEWS Score  MEWS Temp 0  MEWS Systolic 0  MEWS Pulse 3  MEWS RR 0  MEWS LOC 0  MEWS Score 3  MEWS Score Color Yellow  Assess: if the MEWS score is Yellow or Red  Were vital signs accurate and taken at a resting state? Yes  Does the patient meet 2 or more of the SIRS criteria? No  MEWS guidelines implemented  Yes, yellow  Treat  MEWS Interventions Considered administering scheduled or prn medications/treatments as ordered  Take Vital Signs  Increase Vital Sign Frequency  Yellow: Q2hr x1, continue Q4hrs until patient remains green for 12hrs  Escalate  MEWS: Escalate Yellow: Discuss with charge nurse and consider notifying provider and/or RRT  Notify: Charge Nurse/RN  Name of Charge Nurse/RN Notified Monica, RN  Assess: SIRS CRITERIA  SIRS Temperature  0  SIRS Respirations  0  SIRS Pulse 1  SIRS WBC 0  SIRS Score Sum  1

## 2023-11-21 NOTE — Progress Notes (Addendum)
Notified Dr. Nelson Chimes Pt. In AFRVR up to rate 160, but currently 111.

## 2023-11-21 NOTE — Hospital Course (Addendum)
Brief Narrative:  89 year old with history of non-small cell lung cancer on radiation, A-fib, COPD, HTN, diastolic CHF with EF 65%, HLD, OSA on CPAP, pulmonary hypertension comes to the ED with shortness of breath and cough.  Initially noted to be hypoxic.  CTA chest showed multiple new nodules and previously known nodule.  Admitted with concerns of COPD exacerbation.  She also required gentle diuresis during hospitalization.  Otherwise received aggressive bronchodilators, azithromycin and steroids.  She was also seen by cardiology at her request and no further changes were recommended. Over the course of several days patient was doing significantly well therefore transition to oral steroid and she will be discharged home in stable condition today. Family has been updated throughout her hospital stay and on the day of discharge.  Assessment & Plan:  Principal Problem:   Acute hypoxemic respiratory failure (HCC) Active Problems:   COPD with acute exacerbation (HCC)   Essential hypertension   History of CAD (coronary artery disease)   History of lung cancer   Chronic diastolic CHF (congestive heart failure) (HCC)   Pulmonary hypertension (HCC)   Acute exacerbation of CHF (congestive heart failure) (HCC)    Acute hypoxic respiratory failure COPD exacerbation CTA chest concerning for new pulmonary nodules including previously known nodules.  BNP is also slightly elevated. - Continue empiric azithromycin, steroid, bronchodilator.  I-S/flutter valve.  Complete total 5 days of azithromycin.  1 more day - Now on p.o. Lasix   Acute diastolic CHF exacerbation with preserved EF 65% History of pulmonary hypertension Essential hypertension Continue gentle diuresis as able.  Continue home p.o. Lasix - Echocardiogram shows preserved EF with severely elevated PASP and dilated cardiomyopathy. - Seen by cardiology, follow-up outpatient   History of non-small cell lung cancer on radiation  therapy Existing and new pulmonary nodules noted on the CTA chest -Follows outpatient radiation oncology, Dr. Peri Jefferson    History of atrial fibrillation frequent RVR Continue home bisoprolol, Tikosyn, Eliquis These are brief burst.  Seen by cardiology. No further changes.  Follow-up outpatient   Restless leg syndrome -Continue ropinirole 0.5 mg 3 times daily   Hyperlipidemia - Continue Crestor 20 mg daily   Generalized anxiety disorder -Continue clonazepam 0.5 at bedtime as needed.  PT/OT-home health services.  Face-to-face completed   DVT prophylaxis: Eliquis    Code Status: Full Code Family Communication:  family updated.  Status is: Inpatient Remains inpatient appropriate because: Discharge today   Subjective:  Patient feels significantly well today does not have any complaints.  Also discussed with patient's family member over the phone while I was in the room.  All the questions have been answered by me.   Examination:  General exam: Appears calm and comfortable  Respiratory system: Clear to to auscultation bilaterally Cardiovascular system: S1 & S2 heard, RRR. No JVD, murmurs, rubs, gallops or clicks. No pedal edema. Gastrointestinal system: Abdomen is nondistended, soft and nontender. No organomegaly or masses felt. Normal bowel sounds heard. Central nervous system: Alert and oriented. No focal neurological deficits. Extremities: Symmetric 5 x 5 power. Skin: No rashes, lesions or ulcers Psychiatry: Judgement and insight appear normal. Mood & affect appropriate.

## 2023-11-22 DIAGNOSIS — J9601 Acute respiratory failure with hypoxia: Secondary | ICD-10-CM | POA: Diagnosis not present

## 2023-11-22 LAB — BASIC METABOLIC PANEL
Anion gap: 5 (ref 5–15)
BUN: 23 mg/dL (ref 8–23)
CO2: 26 mmol/L (ref 22–32)
Calcium: 8.3 mg/dL — ABNORMAL LOW (ref 8.9–10.3)
Chloride: 103 mmol/L (ref 98–111)
Creatinine, Ser: 0.85 mg/dL (ref 0.44–1.00)
GFR, Estimated: >60 mL/min (ref >60)
Glucose, Bld: 105 mg/dL — ABNORMAL HIGH (ref 70–99)
Potassium: 3.9 mmol/L (ref 3.5–5.1)
Sodium: 134 mmol/L — ABNORMAL LOW (ref 135–145)

## 2023-11-22 LAB — BRAIN NATRIURETIC PEPTIDE: B Natriuretic Peptide: 739.6 pg/mL — ABNORMAL HIGH (ref 0.0–100.0)

## 2023-11-22 LAB — CBC
HCT: 35.5 % — ABNORMAL LOW (ref 36.0–46.0)
Hemoglobin: 11.4 g/dL — ABNORMAL LOW (ref 12.0–15.0)
MCH: 32.5 pg (ref 26.0–34.0)
MCHC: 32.1 g/dL (ref 30.0–36.0)
MCV: 101.1 fL — ABNORMAL HIGH (ref 80.0–100.0)
Platelets: 289 10*3/uL (ref 150–400)
RBC: 3.51 MIL/uL — ABNORMAL LOW (ref 3.87–5.11)
RDW: 14.7 % (ref 11.5–15.5)
WBC: 11.3 10*3/uL — ABNORMAL HIGH (ref 4.0–10.5)
nRBC: 0 % (ref 0.0–0.2)

## 2023-11-22 LAB — MAGNESIUM: Magnesium: 2.3 mg/dL (ref 1.7–2.4)

## 2023-11-22 LAB — PHOSPHORUS: Phosphorus: 3.3 mg/dL (ref 2.5–4.6)

## 2023-11-22 MED ORDER — FUROSEMIDE 10 MG/ML IJ SOLN
20.0000 mg | Freq: Once | INTRAMUSCULAR | Status: AC
  Administered 2023-11-22: 20 mg via INTRAVENOUS
  Filled 2023-11-22: qty 2

## 2023-11-22 MED ORDER — LEVALBUTEROL HCL 0.63 MG/3ML IN NEBU
0.6300 mg | INHALATION_SOLUTION | Freq: Two times a day (BID) | RESPIRATORY_TRACT | Status: DC
  Administered 2023-11-22 – 2023-11-24 (×4): 0.63 mg via RESPIRATORY_TRACT
  Filled 2023-11-22 (×4): qty 3

## 2023-11-22 MED ORDER — IPRATROPIUM BROMIDE 0.02 % IN SOLN
0.5000 mg | Freq: Two times a day (BID) | RESPIRATORY_TRACT | Status: DC
Start: 2023-11-22 — End: 2023-11-24
  Administered 2023-11-22 – 2023-11-24 (×4): 0.5 mg via RESPIRATORY_TRACT
  Filled 2023-11-22 (×4): qty 2.5

## 2023-11-22 MED ORDER — LEVALBUTEROL HCL 0.63 MG/3ML IN NEBU: 0.6300 mg | INHALATION_SOLUTION | Freq: Three times a day (TID) | RESPIRATORY_TRACT | Status: DC | PRN

## 2023-11-22 MED ORDER — AZITHROMYCIN 250 MG PO TABS
500.0000 mg | ORAL_TABLET | Freq: Every day | ORAL | Status: DC
  Administered 2023-11-23 – 2023-11-24 (×2): 500 mg via ORAL
  Filled 2023-11-22 (×2): qty 2

## 2023-11-22 NOTE — Progress Notes (Signed)
PROGRESS NOTE    Holly Benson  SWF:[id number] DOB: [birth year] DOA: 11/20/2023 PCP: Philip Aspen, Limmie Patricia, MD    Brief Narrative:  89 year old with history of non-small cell lung cancer on radiation, A-fib, COPD, HTN, diastolic CHF with EF 65%, HLD, OSA on CPAP, pulmonary hypertension comes to the ED with shortness of breath and cough.  Initially noted to be hypoxic.  CTA chest showed multiple new nodules and previously known nodule.  Admitted with concerns of COPD exacerbation   Assessment & Plan:  Principal Problem:   Acute hypoxemic respiratory failure (HCC) Active Problems:   COPD with acute exacerbation (HCC)   Essential hypertension   History of CAD (coronary artery disease)   History of lung cancer   Chronic diastolic CHF (congestive heart failure) (HCC)   Pulmonary hypertension (HCC)   Acute exacerbation of CHF (congestive heart failure) (HCC)    Acute hypoxic respiratory failure COPD exacerbation CTA chest concerning for new pulmonary nodules including previously known nodules.  BNP is also slightly elevated. - Continue empiric azithromycin, steroid, bronchodilator.  I-S/flutter valve.  Complete total 5 days of azithromycin. - Additional IV Lasix in addition to p.o.   Acute diastolic CHF exacerbation with preserved EF 65% History of pulmonary hypertension Essential hypertension Continue gentle diuresis as able. - Echocardiogram shows preserved EF with severely elevated PASP and dilated cardiomyopathy. -Continue antihypertensives.  IV as needed medications   History of non-small cell lung cancer on radiation therapy Existing and new pulmonary nodules noted on the CTA chest -Follows outpatient radiation oncology, Dr. Peri Jefferson    History of atrial fibrillation frequent RVR Continue home bisoprolol, Tikosyn, Eliquis Consulted cardiology, will see the patient tomorrow IV as needed Lopressor   Restless leg syndrome -Continue ropinirole 0.5 mg 3 times  daily   Hyperlipidemia - Continue Crestor 20 mg daily   Generalized anxiety disorder -Continue clonazepam 0.5 at bedtime as needed.  PT/OT-home health services.  Face-to-face completed   DVT prophylaxis: Eliquis    Code Status: Full Code Family Communication:  called Betsy. Also met with family at bedside.  Status is: Inpatient Remains inpatient appropriate because: Hopefully home in next 24 hours   Subjective: Patient tells me every time she has been getting up she gets tachycardia, RVR with heart rate in 140s.  Improves with rest and Lopressor.  No other complaints.  She also informs me that it has been really hard for her to get into cardiology office to see the attending and has to see Apps.  Reportedly upset but she is very understanding.    Examination:  General exam: Appears calm and comfortable  Respiratory system: Some rhonchi heard Cardiovascular system: S1 & S2 heard, RRR. No JVD, murmurs, rubs, gallops or clicks. No pedal edema. Gastrointestinal system: Abdomen is nondistended, soft and nontender. No organomegaly or masses felt. Normal bowel sounds heard. Central nervous system: Alert and oriented. No focal neurological deficits. Extremities: Symmetric 5 x 5 power. Skin: No rashes, lesions or ulcers Psychiatry: Judgement and insight appear normal. Mood & affect appropriate.                Diet Orders (From admission, onward)     Start     Ordered   11/21/23 0236  Diet Heart Room service appropriate? Yes; Fluid consistency: Thin; Fluid restriction: 2000 mL Fluid  Diet effective now       Comments: Salt striction 2 g/day  Question Answer Comment  Room service appropriate? Yes   Fluid consistency: Thin  Fluid restriction: 2000 mL Fluid      11/21/23 0235            Objective: Vitals:   11/21/23 2101 11/21/23 2131 11/22/23 0432 11/22/23 0907  BP:  (!) 127/58 132/82   Pulse:  90 77   Resp:  20 16   Temp:  98.7 F (37.1 C) 98 F (36.7 C)    TempSrc:  Oral Oral   SpO2: 97% 93% 96% 97%  Weight:      Height:        Intake/Output Summary (Last 24 hours) at 11/22/2023 1150 Last data filed at 11/22/2023 1123 Gross per 24 hour  Intake 730 ml  Output 3300 ml  Net -2570 ml   Filed Weights   11/20/23 1526 11/21/23 0533  Weight: 65.6 kg 66.2 kg    Scheduled Meds:  apixaban  5 mg Oral BID   [START ON 11/23/2023] azithromycin  500 mg Oral Daily   bisoprolol  10 mg Oral QHS   docusate sodium  300 mg Oral QHS   dofetilide  250 mcg Oral BID   furosemide  20 mg Oral BID   guaiFENesin  600 mg Oral BID   ipratropium  0.5 mg Nebulization BID   irbesartan  300 mg Oral Daily   levalbuterol  0.63 mg Nebulization BID   methylPREDNISolone (SOLU-MEDROL) injection  40 mg Intravenous Q12H   pantoprazole  40 mg Oral Daily   polyethylene glycol  17 g Oral QHS   rOPINIRole  0.5 mg Oral TID   rosuvastatin  20 mg Oral Daily   Continuous Infusions:  Nutritional status     Body mass index is 26.07 kg/m.  Data Reviewed:   CBC: Recent Labs  Lab 11/20/23 1552 11/21/23 0820 11/22/23 0640  WBC 8.2 7.0 11.3*  NEUTROABS 5.0  --   --   HGB 13.1 12.2 11.4*  HCT 39.4 37.1 35.5*  MCV 98.3 100.0 101.1*  PLT 284 280 289   Basic Metabolic Panel: Recent Labs  Lab 11/20/23 1552 11/21/23 0820 11/22/23 0640  NA 136 133* 134*  K 4.5 3.9 3.9  CL 102 101 103  CO2 25 26 26   GLUCOSE 100* 141* 105*  BUN 18 16 23   CREATININE 0.71 0.70 0.85  CALCIUM 9.6 8.7* 8.3*  MG  --   --  2.3  PHOS  --   --  3.3   GFR: Estimated Creatinine Clearance: 41.6 mL/min (by C-G formula based on SCr of 0.85 mg/dL). Liver Function Tests: Recent Labs  Lab 11/21/23 0820  AST 21  ALT 24  ALKPHOS 114  BILITOT 0.7  PROT 6.7  ALBUMIN 3.7   No results for input(s): "LIPASE", "AMYLASE" in the last 168 hours. No results for input(s): "AMMONIA" in the last 168 hours. Coagulation Profile: No results for input(s): "INR", "PROTIME" in the last 168  hours. Cardiac Enzymes: No results for input(s): "CKTOTAL", "CKMB", "CKMBINDEX", "TROPONINI" in the last 168 hours. BNP (last 3 results) No results for input(s): "PROBNP" in the last 8760 hours. HbA1C: No results for input(s): "HGBA1C" in the last 72 hours. CBG: No results for input(s): "GLUCAP" in the last 168 hours. Lipid Profile: No results for input(s): "CHOL", "HDL", "LDLCALC", "TRIG", "CHOLHDL", "LDLDIRECT" in the last 72 hours. Thyroid Function Tests: No results for input(s): "TSH", "T4TOTAL", "FREET4", "T3FREE", "THYROIDAB" in the last 72 hours. Anemia Panel: No results for input(s): "VITAMINB12", "FOLATE", "FERRITIN", "TIBC", "IRON", "RETICCTPCT" in the last 72 hours. Sepsis Labs: Recent Labs  Lab  11/21/23 0820  PROCALCITON <0.10    Recent Results (from the past 240 hours)  Resp panel by RT-PCR (RSV, Flu A&B, Covid) Anterior Nasal Swab     Status: None   Collection Time: 11/20/23  3:31 PM   Specimen: Anterior Nasal Swab  Result Value Ref Range Status   SARS Coronavirus 2 by RT PCR NEGATIVE NEGATIVE Final    Comment: (NOTE) SARS-CoV-2 target nucleic acids are NOT DETECTED.  The SARS-CoV-2 RNA is generally detectable in upper respiratory specimens during the acute phase of infection. The lowest concentration of SARS-CoV-2 viral copies this assay can detect is 138 copies/mL. A negative result does not preclude SARS-Cov-2 infection and should not be used as the sole basis for treatment or other patient management decisions. A negative result may occur with  improper specimen collection/handling, submission of specimen other than nasopharyngeal swab, presence of viral mutation(s) within the areas targeted by this assay, and inadequate number of viral copies(<138 copies/mL). A negative result must be combined with clinical observations, patient history, and epidemiological information. The expected result is Negative.  Fact Sheet for Patients:   BloggerCourse.com  Fact Sheet for Healthcare Providers:  SeriousBroker.it  This test is no t yet approved or cleared by the [place] FDA and  has been authorized for detection and/or diagnosis of SARS-CoV-2 by FDA under an Emergency Use Authorization (EUA). This EUA will remain  in effect (meaning this test can be used) for the duration of the COVID-19 declaration under Section 564(b)(1) of the Act, 21 U.S.C.section 360bbb-3(b)(1), unless the authorization is terminated  or revoked sooner.       Influenza A by PCR NEGATIVE NEGATIVE Final   Influenza B by PCR NEGATIVE NEGATIVE Final    Comment: (NOTE) The Xpert Xpress SARS-CoV-2/FLU/RSV plus assay is intended as an aid in the diagnosis of influenza from Nasopharyngeal swab specimens and should not be used as a sole basis for treatment. Nasal washings and aspirates are unacceptable for Xpert Xpress SARS-CoV-2/FLU/RSV testing.  Fact Sheet for Patients: BloggerCourse.com  Fact Sheet for Healthcare Providers: SeriousBroker.it  This test is not yet approved or cleared by the [place] FDA and has been authorized for detection and/or diagnosis of SARS-CoV-2 by FDA under an Emergency Use Authorization (EUA). This EUA will remain in effect (meaning this test can be used) for the duration of the COVID-19 declaration under Section 564(b)(1) of the Act, 21 U.S.C. section 360bbb-3(b)(1), unless the authorization is terminated or revoked.     Resp Syncytial Virus by PCR NEGATIVE NEGATIVE Final    Comment: (NOTE) Fact Sheet for Patients: BloggerCourse.com  Fact Sheet for Healthcare Providers: SeriousBroker.it  This test is not yet approved or cleared by the [place] FDA and has been authorized for detection and/or diagnosis of SARS-CoV-2 by FDA under an Emergency Use  Authorization (EUA). This EUA will remain in effect (meaning this test can be used) for the duration of the COVID-19 declaration under Section 564(b)(1) of the Act, 21 U.S.C. section 360bbb-3(b)(1), unless the authorization is terminated or revoked.  Performed at [facility], [address] [place], [place] [address]          Radiology Studies: ECHOCARDIOGRAM COMPLETE Result Date: 11/21/2023    ECHOCARDIOGRAM REPORT   Patient Name:   RAYLEY CASO [facility] Date of Exam: 11/21/2023 Medical Rec #:  [id number]           Height:       62.8 in Accession #:    [id number]  Weight:       146.0 lb Date of Birth:  1932-11-12           BSA:          1.687 m Patient Age:    89 years            BP:           137/70 mmHg Patient Gender: F                   HR:           81 bpm. Exam Location:  Inpatient Procedure: 2D Echo, Color Doppler, Cardiac Doppler and 3D Echo Indications:    CHF- Acute Diastolic  History:        Patient has prior history of Echocardiogram examinations, most                 recent 02/03/2023. COPD, Signs/Symptoms:Shortness of Breath; Risk                 Factors:Pulmonary Hypertension, Hypertension, Dyslipidemia and                 Sleep Apnea.  Sonographer:    Raeford Razor RDCS Referring Phys: [id number] SUBRINA SUNDIL IMPRESSIONS  1. Left ventricular ejection fraction, by estimation, is 60 to 65%. The left ventricle has normal function. The left ventricle has no regional wall motion abnormalities. Left ventricular diastolic parameters are indeterminate.  2. Right ventricular systolic function is normal. The right ventricular size is mildly enlarged. There is severely elevated pulmonary artery systolic pressure.  3. Left atrial size was severely dilated.  4. Right atrial size was severely dilated.  5. The mitral valve is normal in structure. Mild mitral valve regurgitation. No evidence of mitral stenosis.  6. Tricuspid valve regurgitation is moderate.  7. The aortic valve  is tricuspid. Aortic valve regurgitation is mild. No aortic stenosis is present.  8. The inferior vena cava is dilated in size with >50% respiratory variability, suggesting right atrial pressure of 8 mmHg. FINDINGS  Left Ventricle: Left ventricular ejection fraction, by estimation, is 60 to 65%. The left ventricle has normal function. The left ventricle has no regional wall motion abnormalities. The left ventricular internal cavity size was normal in size. There is  no left ventricular hypertrophy. Left ventricular diastolic parameters are indeterminate. Right Ventricle: The right ventricular size is mildly enlarged. Right ventricular systolic function is normal. There is severely elevated pulmonary artery systolic pressure. The tricuspid regurgitant velocity is 3.70 m/s, and with an assumed right atrial  pressure of 8 mmHg, the estimated right ventricular systolic pressure is 62.8 mmHg. Left Atrium: Left atrial size was severely dilated. Right Atrium: Right atrial size was severely dilated. Pericardium: There is no evidence of pericardial effusion. Mitral Valve: The mitral valve is normal in structure. Mild mitral annular calcification. Mild mitral valve regurgitation. No evidence of mitral valve stenosis. Tricuspid Valve: The tricuspid valve is normal in structure. Tricuspid valve regurgitation is moderate . No evidence of tricuspid stenosis. Aortic Valve: The aortic valve is tricuspid. Aortic valve regurgitation is mild. Aortic regurgitation PHT measures 320 msec. No aortic stenosis is present. Aortic valve mean gradient measures 4.0 mmHg. Aortic valve peak gradient measures 7.1 mmHg. Aortic  valve area, by VTI measures 2.07 cm. Pulmonic Valve: The pulmonic valve was normal in structure. Pulmonic valve regurgitation is not visualized. No evidence of pulmonic stenosis. Aorta: The aortic root is normal in size and structure. Venous: The inferior vena  cava is dilated in size with greater than 50% respiratory  variability, suggesting right atrial pressure of 8 mmHg. IAS/Shunts: No atrial level shunt detected by color flow Doppler.  LEFT VENTRICLE PLAX 2D LVIDd:         4.10 cm   Diastology LVIDs:         2.90 cm   LV e' medial:   5.98 cm/s LV PW:         1.10 cm   LV E/e' medial: 21.4 LV IVS:        1.00 cm LVOT diam:     1.90 cm LV SV:         63 LV SV Index:   37 LVOT Area:     2.84 cm  3D Volume EF:                          3D EF:        64 %                          LV EDV:       92 ml                          LV ESV:       33 ml                          LV SV:        59 ml RIGHT VENTRICLE             IVC RV Basal diam:  3.60 cm     IVC diam: 2.50 cm RV Mid diam:    2.60 cm RV S prime:     11.10 cm/s TAPSE (M-mode): 1.8 cm LEFT ATRIUM              Index        RIGHT ATRIUM           Index LA diam:        4.20 cm  2.49 cm/m   RA Area:     32.30 cm LA Vol (A2C):   102.0 ml 60.46 ml/m  RA Volume:   116.00 ml 68.76 ml/m LA Vol (A4C):   106.0 ml 62.83 ml/m LA Biplane Vol: 110.0 ml 65.21 ml/m  AORTIC VALVE AV Area (Vmax):    2.15 cm AV Area (Vmean):   2.07 cm AV Area (VTI):     2.07 cm AV Vmax:           133.00 cm/s AV Vmean:          93.700 cm/s AV VTI:            0.306 m AV Peak Grad:      7.1 mmHg AV Mean Grad:      4.0 mmHg LVOT Vmax:         101.00 cm/s LVOT Vmean:        68.300 cm/s LVOT VTI:          0.223 m LVOT/AV VTI ratio: 0.73 AI PHT:            320 msec  AORTA Ao Root diam: 3.10 cm Ao Asc diam:  2.80 cm MITRAL VALVE                TRICUSPID VALVE MV Area (PHT): 4.36  cm     TR Peak grad:   54.8 mmHg MV Decel Time: 174 msec     TR Vmax:        370.00 cm/s MV E velocity: 128.00 cm/s                             SHUNTS                             Systemic VTI:  0.22 m                             Systemic Diam: 1.90 cm Olga Millers MD Electronically signed by Olga Millers MD Signature Date/Time: 11/21/2023/2:10:06 PM    Final    CT Angio Chest PE W and/or Wo Contrast Result Date: 11/20/2023 CLINICAL  DATA:  High probability pulmonary embolism, dyspnea, lung cancer. * Tracking Code: BO * EXAM: CT ANGIOGRAPHY CHEST WITH CONTRAST TECHNIQUE: Multidetector CT imaging of the chest was performed using the standard protocol during bolus administration of intravenous contrast. Multiplanar CT image reconstructions and MIPs were obtained to evaluate the vascular anatomy. RADIATION DOSE REDUCTION: This exam was performed according to the departmental dose-optimization program which includes automated exposure control, adjustment of the mA and/or kV according to patient size and/or use of iterative reconstruction technique. CONTRAST:  75mL OMNIPAQUE IOHEXOL 350 MG/ML SOLN COMPARISON:  09/18/2023 FINDINGS: Cardiovascular: Extensive multi-vessel coronary artery calcification. Global cardiac size is enlarged with biatrial enlargement noted. No pericardial effusion. The central pulmonary arteries are of normal caliber. There is no intraluminal filling defect identified to suggest acute pulmonary embolism. Extensive atherosclerotic calcification within the thoracic aorta. No aortic aneurysm. Mediastinum/Nodes: No enlarged mediastinal, hilar, or axillary lymph nodes. Thyroid gland, trachea, and esophagus demonstrate no significant findings. Lungs/Pleura: 11 mm right apical nodule, noted to be hypermetabolic on 08/13/2023 and compatible with patient's known primary bronchogenic malignancy appears stable. Interval development of surrounding architectural distortion with inter and intra lobular septal thickening at the right apex may reflect changes of radiation therapy. Interval development of multiple nodules within the left upper lobe measuring up to 13 x 18 mm axial # 62/6, nonspecific, possibly infectious though development of multiple intra pulmonary metastases is not excluded. The latter is considered less likely, however, given its relatively isolated distribution within the left upper lobe only. Superimposed mild emphysema.  Small bilateral pleural effusions have developed. No pneumothorax. No central obstructing lesion. Upper Abdomen: No acute abnormality. Musculoskeletal: No acute bone abnormality. No lytic or blastic bone lesion. Review of the MIP images confirms the above findings. IMPRESSION: 1. No pulmonary embolism. 2. Stable 11 mm right apical nodule, noted to be hypermetabolic on 08/13/2023 and compatible with patient's known primary bronchogenic malignancy. Interval development of surrounding architectural distortion with inter and intra lobular septal thickening at the right apex may reflect changes of radiation therapy. 3. Interval development of multiple nodules within the left upper lobe measuring up to 13 x 18 mm, nonspecific, possibly infectious though development of multiple intra pulmonary metastases is not excluded. The latter is considered less likely, given its relatively isolated distribution within the left upper lobe only. Short-term follow-up imaging at 3 months would be helpful in differentiating these entities 4. Interval development of small bilateral pleural effusions. 5. Extensive multi-vessel coronary artery calcification. 6. Mild edema Aortic Atherosclerosis (ICD10-I70.0) and Emphysema (ICD10-J43.9). Electronically Signed  By: Helyn Numbers M.D.   On: 11/20/2023 21:17   DG Chest Port 1 View Result Date: 11/20/2023 CLINICAL DATA:  Difficulty breathing. EXAM: PORTABLE CHEST 1 VIEW COMPARISON:  01/31/2021. FINDINGS: There are chronic nonspecific opacities throughout bilateral lungs including bilateral lung apices, favored to represent fibrosis/scarring. Bilateral lung fields are otherwise clear. No acute consolidation or lung collapse. Bilateral costophrenic angles are clear. Stable cardio-mediastinal silhouette. No acute osseous abnormalities. Right proximal humerus metallic hardware noted. The soft tissues are within normal limits. IMPRESSION: *No acute cardiopulmonary abnormality. Electronically  Signed   By: Jules Schick M.D.   On: 11/20/2023 16:37           LOS: 1 day   Time spent= 35 mins    Miguel Rota, MD [facility]  If 7PM-7AM, please contact night-coverage  11/22/2023, 11:50 AM

## 2023-11-22 NOTE — Evaluation (Signed)
Occupational Therapy Evaluation Patient Details Name: Holly Benson MRN: [id number] DOB: 1933/05/04 Today's Date: 11/22/2023   History of Present Illness Patient is a 89 year old who presented to the ED with shortness of breath and cough.  Initially noted to be hypoxic.  CTA chest showed multiple new nodules and previously known nodule.  Admitted with concerns of COPD exacerbation. PMH: non-small cell lung cancer on radiation, A-fib, COPD, HTN, diastolic CHF with EF 65%, HLD, OSA on CPAP, pulmonary hypertension   Clinical Impression   Patient is a 89 year old female who was admitted for above. Patient as living at home with room on second floor at daughters house. Patient was CGA for ADLs and functional mobility in hallway with increased unsteadiness with fatigue. Patient was noted to be 87% on RA after activity with patient able to bring O2 back up while seated to 93% on RA with education on diaphragmatic breathing. Patient was noted to have decreased functional activity tolerance, decreased endurance, decreased standing balance, decreased safety awareness, and decreased knowledge of AD/AE impacting participation in ADLs. Patient would continue to benefit from skilled OT services at this time while admitted and after d/c to address noted deficits in order to improve overall safety and independence in ADLs. Patient plans to d/c to daughters house with [facility] when medically stable.       If plan is discharge home, recommend the following: A little help with walking and/or transfers;A little help with bathing/dressing/bathroom;Assistance with cooking/housework;Assist for transportation;Help with stairs or ramp for entrance    Functional Status Assessment  Patient has had a recent decline in their functional status and demonstrates the ability to make significant improvements in function in a reasonable and predictable amount of time.  Equipment Recommendations  None recommended by OT        Precautions / Restrictions Precautions Precautions: Fall Precaution Comments: monitor O2 and HR Restrictions Weight Bearing Restrictions Per Provider Order: No      Mobility Bed Mobility               General bed mobility comments: NT pt in recliner        Balance Overall balance assessment: Needs assistance Sitting-balance support: No upper extremity supported, Feet supported Sitting balance-Leahy Scale: Good     Standing balance support: During functional activity, No upper extremity supported Standing balance-Leahy Scale: Fair         ADL either performed or assessed with clinical judgement   ADL Overall ADL's : Needs assistance/impaired Eating/Feeding: Modified independent;Sitting   Grooming: Supervision/safety;Standing   Upper Body Bathing: Sitting;Set up   Lower Body Bathing: Sitting/lateral leans;Contact guard assist;Set up   Upper Body Dressing : Sitting;Set up   Lower Body Dressing: Sitting/lateral leans;Contact guard assist;Set up   Toilet Transfer: Contact guard assist;Ambulation Toilet Transfer Details (indicate cue type and reason): with no AD with no reachign out to furniture in room. able to navigate multipl family members and obstables with no LOB. noted to have crossing of feet in open hallway with education on turns instead of crossing feet. patient reported having SOB noted to be 87% on RA with in ability to dep breath back up while standing. sitting able to deep breath bac k up to 93% on RA within 45 seconds. nurse made aware. Toileting- Clothing Manipulation and Hygiene: Contact guard assist;Sit to/from stand       Functional mobility during ADLs: Contact guard assist General ADL Comments: in hallway with no AD and some unsteadiness with increaed fatiuge. patient  reported she normally moves fast to prevent LOB at home     Vision Baseline Vision/History: 1 Wears glasses              Pertinent Vitals/Pain Pain Assessment Pain  Assessment: No/denies pain     Extremity/Trunk Assessment Upper Extremity Assessment Upper Extremity Assessment: Overall WFL for tasks assessed;Right hand dominant (L hand stronger than R hand. 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 patient was plesant and cooperative.                Home Living Family/patient expects to be discharged to:: Private residence Living Arrangements: Children Available Help at Discharge: Family Type of Home: House Home Access: Stairs to enter Secretary/administrator of Steps: 4 Entrance Stairs-Rails: Right Home Layout: Two level Alternate Level Stairs-Number of Steps: 14             Home Equipment: Cane - single point          Prior Functioning/Environment Prior Level of Function : Independent/Modified Independent;Driving                        OT Problem List: Decreased strength;Cardiopulmonary status limiting activity;Decreased activity tolerance;Decreased knowledge of use of DME or AE;Decreased safety awareness      OT Treatment/Interventions: Self-care/ADL training;DME and/or AE instruction;Therapeutic activities;Balance training;Therapeutic exercise;Patient/family education    OT Goals(Current goals can be found in the care plan section) Acute Rehab OT Goals Patient Stated Goal: to get off O2 OT Goal Formulation: With patient/family Time For Goal Achievement: 12/06/23 Potential to Achieve Goals: Fair  OT Frequency: Min 1X/week       AM-PAC OT "6 Clicks" Daily Activity     Outcome Measure Help from another person  eating meals?: None Help from another person taking care of personal grooming?: A Little Help from another person toileting, which includes using toliet, bedpan, or urinal?: A Little Help from another person bathing (including washing, rinsing, drying)?: A Little Help from another person to put on and taking off regular upper body clothing?: A Little Help from another person to put on and taking off regular lower body clothing?: A Little 6 Click Score: 19   End of Session Equipment Utilized During Treatment: Gait belt Nurse Communication: Mobility status;Other (comment) (O2 during session)  Activity Tolerance: Patient tolerated treatment well Patient left: in chair;with call bell/phone within reach;with family/visitor present  OT Visit Diagnosis: Unsteadiness on feet (R26.81);Other abnormalities of gait and mobility (R26.89)                Time: 1610-9604 OT Time Calculation (min): 22 min Charges:  OT General Charges $OT Visit: 1 Visit OT Evaluation $OT Eval Low Complexity: 1 Low  Joshuan Bolander OTR/L, MS Acute Rehabilitation Department Office# [phone]   Selinda Flavin 11/22/2023, 12:25 PM

## 2023-11-22 NOTE — Plan of Care (Signed)

## 2023-11-22 NOTE — Progress Notes (Signed)
Holly Benson has been ambulating in her room with no Oxygen, she states her pulse is remaining in the 70-80's and her O2 is staying above 92%, she is using her Apple watch to monitor this. Patient states she feels good.

## 2023-11-23 DIAGNOSIS — I480 Paroxysmal atrial fibrillation: Secondary | ICD-10-CM

## 2023-11-23 DIAGNOSIS — J9601 Acute respiratory failure with hypoxia: Secondary | ICD-10-CM | POA: Diagnosis not present

## 2023-11-23 LAB — CBC
HCT: 37.6 % (ref 36.0–46.0)
Hemoglobin: 12.5 g/dL (ref 12.0–15.0)
MCH: 33.3 pg (ref 26.0–34.0)
MCHC: 33.2 g/dL (ref 30.0–36.0)
MCV: 100.3 fL — ABNORMAL HIGH (ref 80.0–100.0)
Platelets: 291 10*3/uL (ref 150–400)
RBC: 3.75 MIL/uL — ABNORMAL LOW (ref 3.87–5.11)
RDW: 14.5 % (ref 11.5–15.5)
WBC: 10.5 10*3/uL (ref 4.0–10.5)
nRBC: 0 % (ref 0.0–0.2)

## 2023-11-23 LAB — MAGNESIUM: Magnesium: 2.5 mg/dL — ABNORMAL HIGH (ref 1.7–2.4)

## 2023-11-23 LAB — BASIC METABOLIC PANEL
Anion gap: 11 (ref 5–15)
BUN: 31 mg/dL — ABNORMAL HIGH (ref 8–23)
CO2: 26 mmol/L (ref 22–32)
Calcium: 8.8 mg/dL — ABNORMAL LOW (ref 8.9–10.3)
Chloride: 100 mmol/L (ref 98–111)
Creatinine, Ser: 0.65 mg/dL (ref 0.44–1.00)
GFR, Estimated: >60 mL/min (ref >60)
Glucose, Bld: 146 mg/dL — ABNORMAL HIGH (ref 70–99)
Potassium: 3.6 mmol/L (ref 3.5–5.1)
Sodium: 137 mmol/L (ref 135–145)

## 2023-11-23 MED ORDER — POTASSIUM CHLORIDE CRYS ER 20 MEQ PO TBCR
40.0000 meq | EXTENDED_RELEASE_TABLET | Freq: Once | ORAL | Status: AC
  Administered 2023-11-23: 40 meq via ORAL
  Filled 2023-11-23: qty 2

## 2023-11-23 NOTE — Consult Note (Addendum)
Cardiology Consultation   Patient ID: Holly Benson MRN: [id number]; DOB: Feb 12, 1933  Admit date: 11/20/2023 Date of Consult: 11/23/2023  PCP:  Philip Aspen, Limmie Patricia, MD   [facility] Providers Cardiologist:  Chrystie Nose, MD  Electrophysiologist:  Regan Lemming, MD  {  Patient Profile:   Holly Benson is a 89 y.o. female with a hx of PAF, OSA on CPAP, chronic HFrEF, pulmonary hypertension, sleep coronary calcifications on CCTA (negative FFR), hypertension, COPD, non-small cell lung cancer status postradiation who is being seen 11/23/2023 for the evaluation of PAF at the request of Dr. Nelson Chimes.  History of Present Illness:   Holly Benson is primarily being managed for her paroxysmal atrial fibrillation followed by Dr. Rennis Golden as well as EP.  She is on Tikosyn and bisoprolol 10 mg and overall has done well with this.  Options have been somewhat limited due to her COPD.  More recently she has had some fluctuations in maintaining rhythm due to acute illnesses however generally maintains NSR otherwise. She has also noted some shortness of breath and fatigue that has been difficult to discern whether this has been more related to her pulmonary issues, pulmonology did not feel it was contributing.  She has coronary calcifications noted on CTA however FFR analysis did not show obstructive CAD.    Currently patient being admitted for COPD exacerbation.  She had a CTA of her chest that did not show PE however did show new nodules despite having recent radiation therapy, questionable malignancy.  Cardiology asked to see due to intermittent tachycardia with heart rates in the 40s.  Overall patient asymptomatic from these episodes atrial fibrillation is not known any significant shortness of palpitations, dizziness, pain.  She reports compliancy with her medications and wears an Apple Watch to help monitor her A-fib.  She is also been prescribed Lasix on a specific regimen  and notes that this has significantly helped her swelling.  She reports that she feels close to baseline.  Ambulated yesterday and did very well with this.  At some shortness of breath this morning with walking however feels close to baseline.  Otherwise does not describe any significant complaints.   BNP 731. troponins negative x 2.  Respiratory panel negative.  Potassium 3.6.  Magnesium 2.5.  Creatinine 0.65.  Hemoglobin 12.5.  Past Medical History:  Diagnosis Date   Arrhythmia    Arthritis    Atrial fibrillation (HCC)    Constipation    chronic   COPD (chronic obstructive pulmonary disease) (HCC)    History of radiation therapy    Right lung- 09/22/23-09/29/23- Dr. Antony Blackbird   Hyperlipidemia    Hypertension    pulmonary   OSA on CPAP    Osteoarthritis    Osteoporosis    Peripheral neuropathy    Pulmonary hypertension (HCC)    RLS (restless legs syndrome)    Sleep apnea     Past Surgical History:  Procedure Laterality Date   ABDOMINAL AORTOGRAM W/LOWER EXTREMITY N/A 03/21/2020   Procedure: ABDOMINAL AORTOGRAM W/ Bilateral LOWER EXTREMITY Runoff;  Surgeon: Iran Ouch, MD;  Location: [facility];  Service: Cardiovascular;  Laterality: N/A;   ABDOMINAL HYSTERECTOMY Bilateral    arm surgery Right    Broken arm and has a plate in it   CARDIOVERSION N/A 03/12/2023   Procedure: CARDIOVERSION;  Surgeon: Quintella Reichert, MD;  Location: [facility];  Service: Cardiovascular;  Laterality: N/A;   CATARACT EXTRACTION Bilateral  COLONOSCOPY     More than 10 years ago In [facility]   HERNIA REPAIR  08/2022   KNEE ARTHROSCOPY Right    PERIPHERAL VASCULAR INTERVENTION Bilateral 03/21/2020   Procedure: PERIPHERAL VASCULAR INTERVENTION;  Surgeon: Iran Ouch, MD;  Location: [facility];  Service: Cardiovascular;  Laterality: Bilateral;  external iliac   TONSILLECTOMY     ventral herniorrhaphy  2023   mesh used    Inpatient  Medications: Scheduled Meds:  apixaban  5 mg Oral BID   azithromycin  500 mg Oral Daily   bisoprolol  10 mg Oral QHS   docusate sodium  300 mg Oral QHS   dofetilide  250 mcg Oral BID   furosemide  20 mg Oral BID   guaiFENesin  600 mg Oral BID   ipratropium  0.5 mg Nebulization BID   irbesartan  300 mg Oral Daily   levalbuterol  0.63 mg Nebulization BID   methylPREDNISolone (SOLU-MEDROL) injection  40 mg Intravenous Q12H   pantoprazole  40 mg Oral Daily   polyethylene glycol  17 g Oral QHS   rOPINIRole  0.5 mg Oral TID   rosuvastatin  20 mg Oral Daily   Continuous Infusions:  PRN Meds: acetaminophen **OR** acetaminophen, clonazePAM, guaiFENesin, hydrALAZINE, levalbuterol, metoprolol tartrate, ondansetron **OR** ondansetron (ZOFRAN) IV, senna-docusate  Allergies:    Allergies  Allergen Reactions   Lovenox [Enoxaparin Sodium] Hives and Rash    PT STATES SHE BROKE OUT IN A RASH HEAD TO TOE AND LASTED ABOUT 3 WEEKS    Metoprolol Succinate [Metoprolol] Other (See Comments)    Muscle aches, hand pain, and tingling    Social History:   Social History   Socioeconomic History   Marital status: Widowed    Spouse name: Not on file   Number of children: 3   Years of education: Not on file   Highest education level: Master's degree (e.g., MA, MS, MEng, MEd, MSW, MBA)  Occupational History   Occupation: retired    Comment: retired Comptroller  Tobacco Use   Smoking status: Former    Current packs/day: 0.00    Average packs/day: 1.5 packs/day for 37.0 years (55.5 ttl pk-yrs)    Types: Cigarettes    Start date: 56    Quit date: 11/04/1991    Years since quitting: 32.0   Smokeless tobacco: Never   Tobacco comments:    Former smoker 03/14/22  Vaping Use   Vaping status: Never Used  Substance and Sexual Activity   Alcohol use: Yes    Alcohol/week: 7.0 standard drinks of alcohol    Types: 7 Glasses of wine per week    Comment: 1 glass of wine nightly 03/14/22   Drug use: No    Sexual activity: Not Currently    Birth control/protection: Surgical    Comment: Hyst, First IC >16y/o, Partners <5, No hx of STIs, DES-neg  Other Topics Concern   Not on file  Social History Narrative   Lives with daughter   Social Drivers of Health   Financial Resource Strain: Low Risk  (11/16/2023)   Overall Financial Resource Strain (CARDIA)    Difficulty of Paying Living Expenses: Not hard at all  Food Insecurity: No Food Insecurity (11/21/2023)   Hunger Vital Sign    Worried About Running Out of Food in the Last Year: Never true    Ran Out of Food in the Last Year: Never true  Transportation Needs: No Transportation Needs (11/21/2023)   PRAPARE - Transportation  Lack of Transportation (Medical): No    Lack of Transportation (Non-Medical): No  Physical Activity: Insufficiently Active (11/16/2023)   Exercise Vital Sign    Days of Exercise per Week: 4 days    Minutes of Exercise per Session: 30 min  Stress: No Stress Concern Present (11/16/2023)   [employer] of Occupational Health - Occupational Stress Questionnaire    Feeling of Stress : Not at all  Social Connections: Moderately Integrated (11/21/2023)   Social Connection and Isolation Panel [NHANES]    Frequency of Communication with Friends and Family: More than three times a week    Frequency of Social Gatherings with Friends and Family: Three times a week    Attends Religious Services: 1 to 4 times per year    Active Member of Clubs or Organizations: Yes    Attends Banker Meetings: 1 to 4 times per year    Marital Status: Widowed  Intimate Partner Violence: Not At Risk (11/21/2023)   Humiliation, Afraid, Rape, and Kick questionnaire    Fear of Current or Ex-Partner: No    Emotionally Abused: No    Physically Abused: No    Sexually Abused: No    Family History:   Family History  Problem Relation Age of Onset   Suicidality Father    Stroke Maternal Grandfather    Hypertension Sister    Colon  cancer Neg Hx    Esophageal cancer Neg Hx    Pancreatic cancer Neg Hx    Stomach cancer Neg Hx    Liver disease Neg Hx      ROS:  Please see the history of present illness.  All other ROS reviewed and negative.     Physical Exam/Data:   Vitals:   11/22/23 1358 11/22/23 2052 11/22/23 2151 11/23/23 0506  BP: (!) 109/48  139/64 (!) 151/65  Pulse: 70  83 69  Resp: 16  15 15   Temp: 97.7 F (36.5 C)  (!) 97.3 F (36.3 C) (!) 97.4 F (36.3 C)  TempSrc: Oral  Oral Oral  SpO2: 93% 97% 94% 95%  Weight:      Height:        Intake/Output Summary (Last 24 hours) at 11/23/2023 0809 Last data filed at 11/23/2023 0500 Gross per 24 hour  Intake 1160 ml  Output 3100 ml  Net -1940 ml      11/21/2023    5:33 AM 11/20/2023    3:26 PM 11/17/2023   10:22 AM  Last 3 Weights  Weight (lbs) 146 lb 144 lb 10 oz 144 lb 11.2 oz  Weight (kg) 66.225 kg 65.6 kg 65.635 kg     Body mass index is 26.07 kg/m.  General:  Well nourished, well developed, in no acute distress HEENT: normal Neck: no JVD Vascular: No carotid bruits; Distal pulses 2+ bilaterally Cardiac:  normal S1, S2; RRR; no murmur. Lungs:  clear to auscultation, positive wheezing Abd: soft, nontender, no hepatomegaly  Ext: no edema Musculoskeletal:  No deformities, BUE and BLE strength normal and equal Skin: warm and dry  Neuro:  CNs 2-12 intact, no focal abnormalities noted Psych:  Normal affect   EKG:  The EKG was personally reviewed and demonstrates: Sinus rhythm with PACs.  Incomplete right bundle branch block.  Nonspecific ST-T wave changes. Telemetry:  Telemetry was personally reviewed and demonstrates: Paroxysmal atrial fibrillation generally maintaining sinus rhythm.  Intermittent episodes of A-fib 10 to 20 minutes.  Relevant CV Studies: Echocardiogram 11/21/2023  1. Left ventricular ejection fraction, by  estimation, is 60 to 65%. The  left ventricle has normal function. The left ventricle has no regional  wall motion  abnormalities. Left ventricular diastolic parameters are  indeterminate.   2. Right ventricular systolic function is normal. The right ventricular  size is mildly enlarged. There is severely elevated pulmonary artery  systolic pressure.   3. Left atrial size was severely dilated.   4. Right atrial size was severely dilated.   5. The mitral valve is normal in structure. Mild mitral valve  regurgitation. No evidence of mitral stenosis.   6. Tricuspid valve regurgitation is moderate.   7. The aortic valve is tricuspid. Aortic valve regurgitation is mild. No  aortic stenosis is present.   8. The inferior vena cava is dilated in size with >50% respiratory  variability, suggesting right atrial pressure of 8 mmHg.    Laboratory Data:  High Sensitivity Troponin:   Recent Labs  Lab 11/20/23 1552 11/20/23 1859  TROPONINIHS 8 8     Chemistry Recent Labs  Lab 11/21/23 0820 11/22/23 0640 11/23/23 0448  NA 133* 134* 137  K 3.9 3.9 3.6  CL 101 103 100  CO2 26 26 26   GLUCOSE 141* 105* 146*  BUN 16 23 31*  CREATININE 0.70 0.85 0.65  CALCIUM 8.7* 8.3* 8.8*  MG  --  2.3 2.5*  GFRNONAA >60 >60 >60  ANIONGAP 6 5 11     Recent Labs  Lab 11/21/23 0820  PROT 6.7  ALBUMIN 3.7  AST 21  ALT 24  ALKPHOS 114  BILITOT 0.7   Lipids No results for input(s): "CHOL", "TRIG", "HDL", "LABVLDL", "LDLCALC", "CHOLHDL" in the last 168 hours.  Hematology Recent Labs  Lab 11/21/23 0820 11/22/23 0640 11/23/23 0448  WBC 7.0 11.3* 10.5  RBC 3.71* 3.51* 3.75*  HGB 12.2 11.4* 12.5  HCT 37.1 35.5* 37.6  MCV 100.0 101.1* 100.3*  MCH 32.9 32.5 33.3  MCHC 32.9 32.1 33.2  RDW 14.5 14.7 14.5  PLT 280 289 291   Thyroid No results for input(s): "TSH", "FREET4" in the last 168 hours.  BNP Recent Labs  Lab 11/20/23 1552 11/22/23 0640  BNP 796.9* 739.6*    DDimer No results for input(s): "DDIMER" in the last 168 hours.   Radiology/Studies:  ECHOCARDIOGRAM COMPLETE Result Date: 11/21/2023     ECHOCARDIOGRAM REPORT   Patient Name:   Holly Benson [facility] Date of Exam: 11/21/2023 Medical Rec #:  [id number]           Height:       62.8 in Accession #:    [id number]          Weight:       146.0 lb Date of Birth:  22-Feb-1933           BSA:          1.687 m Patient Age:    89 years            BP:           137/70 mmHg Patient Gender: F                   HR:           81 bpm. Exam Location:  Inpatient Procedure: 2D Echo, Color Doppler, Cardiac Doppler and 3D Echo Indications:    CHF- Acute Diastolic  History:        Patient has prior history of Echocardiogram examinations, most  recent 02/03/2023. COPD, Signs/Symptoms:Shortness of Breath; Risk                 Factors:Pulmonary Hypertension, Hypertension, Dyslipidemia and                 Sleep Apnea.  Sonographer:    Raeford Razor RDCS Referring Phys: [id number] SUBRINA SUNDIL IMPRESSIONS  1. Left ventricular ejection fraction, by estimation, is 60 to 65%. The left ventricle has normal function. The left ventricle has no regional wall motion abnormalities. Left ventricular diastolic parameters are indeterminate.  2. Right ventricular systolic function is normal. The right ventricular size is mildly enlarged. There is severely elevated pulmonary artery systolic pressure.  3. Left atrial size was severely dilated.  4. Right atrial size was severely dilated.  5. The mitral valve is normal in structure. Mild mitral valve regurgitation. No evidence of mitral stenosis.  6. Tricuspid valve regurgitation is moderate.  7. The aortic valve is tricuspid. Aortic valve regurgitation is mild. No aortic stenosis is present.  8. The inferior vena cava is dilated in size with >50% respiratory variability, suggesting right atrial pressure of 8 mmHg. FINDINGS  Left Ventricle: Left ventricular ejection fraction, by estimation, is 60 to 65%. The left ventricle has normal function. The left ventricle has no regional wall motion abnormalities. The left ventricular internal cavity  size was normal in size. There is  no left ventricular hypertrophy. Left ventricular diastolic parameters are indeterminate. Right Ventricle: The right ventricular size is mildly enlarged. Right ventricular systolic function is normal. There is severely elevated pulmonary artery systolic pressure. The tricuspid regurgitant velocity is 3.70 m/s, and with an assumed right atrial  pressure of 8 mmHg, the estimated right ventricular systolic pressure is 62.8 mmHg. Left Atrium: Left atrial size was severely dilated. Right Atrium: Right atrial size was severely dilated. Pericardium: There is no evidence of pericardial effusion. Mitral Valve: The mitral valve is normal in structure. Mild mitral annular calcification. Mild mitral valve regurgitation. No evidence of mitral valve stenosis. Tricuspid Valve: The tricuspid valve is normal in structure. Tricuspid valve regurgitation is moderate . No evidence of tricuspid stenosis. Aortic Valve: The aortic valve is tricuspid. Aortic valve regurgitation is mild. Aortic regurgitation PHT measures 320 msec. No aortic stenosis is present. Aortic valve mean gradient measures 4.0 mmHg. Aortic valve peak gradient measures 7.1 mmHg. Aortic  valve area, by VTI measures 2.07 cm. Pulmonic Valve: The pulmonic valve was normal in structure. Pulmonic valve regurgitation is not visualized. No evidence of pulmonic stenosis. Aorta: The aortic root is normal in size and structure. Venous: The inferior vena cava is dilated in size with greater than 50% respiratory variability, suggesting right atrial pressure of 8 mmHg. IAS/Shunts: No atrial level shunt detected by color flow Doppler.  LEFT VENTRICLE PLAX 2D LVIDd:         4.10 cm   Diastology LVIDs:         2.90 cm   LV e' medial:   5.98 cm/s LV PW:         1.10 cm   LV E/e' medial: 21.4 LV IVS:        1.00 cm LVOT diam:     1.90 cm LV SV:         63 LV SV Index:   37 LVOT Area:     2.84 cm  3D Volume EF:  3D EF:        64  %                          LV EDV:       92 ml                          LV ESV:       33 ml                          LV SV:        59 ml RIGHT VENTRICLE             IVC RV Basal diam:  3.60 cm     IVC diam: 2.50 cm RV Mid diam:    2.60 cm RV S prime:     11.10 cm/s TAPSE (M-mode): 1.8 cm LEFT ATRIUM              Index        RIGHT ATRIUM           Index LA diam:        4.20 cm  2.49 cm/m   RA Area:     32.30 cm LA Vol (A2C):   102.0 ml 60.46 ml/m  RA Volume:   116.00 ml 68.76 ml/m LA Vol (A4C):   106.0 ml 62.83 ml/m LA Biplane Vol: 110.0 ml 65.21 ml/m  AORTIC VALVE AV Area (Vmax):    2.15 cm AV Area (Vmean):   2.07 cm AV Area (VTI):     2.07 cm AV Vmax:           133.00 cm/s AV Vmean:          93.700 cm/s AV VTI:            0.306 m AV Peak Grad:      7.1 mmHg AV Mean Grad:      4.0 mmHg LVOT Vmax:         101.00 cm/s LVOT Vmean:        68.300 cm/s LVOT VTI:          0.223 m LVOT/AV VTI ratio: 0.73 AI PHT:            320 msec  AORTA Ao Root diam: 3.10 cm Ao Asc diam:  2.80 cm MITRAL VALVE                TRICUSPID VALVE MV Area (PHT): 4.36 cm     TR Peak grad:   54.8 mmHg MV Decel Time: 174 msec     TR Vmax:        370.00 cm/s MV E velocity: 128.00 cm/s                             SHUNTS                             Systemic VTI:  0.22 m                             Systemic Diam: 1.90 cm Olga Millers MD Electronically signed by Olga Millers MD Signature Date/Time: 11/21/2023/2:10:06 PM    Final    CT Angio Chest PE W and/or Wo  Contrast Result Date: 11/20/2023 CLINICAL DATA:  High probability pulmonary embolism, dyspnea, lung cancer. * Tracking Code: BO * EXAM: CT ANGIOGRAPHY CHEST WITH CONTRAST TECHNIQUE: Multidetector CT imaging of the chest was performed using the standard protocol during bolus administration of intravenous contrast. Multiplanar CT image reconstructions and MIPs were obtained to evaluate the vascular anatomy. RADIATION DOSE REDUCTION: This exam was performed according to the  departmental dose-optimization program which includes automated exposure control, adjustment of the mA and/or kV according to patient size and/or use of iterative reconstruction technique. CONTRAST:  75mL OMNIPAQUE IOHEXOL 350 MG/ML SOLN COMPARISON:  09/18/2023 FINDINGS: Cardiovascular: Extensive multi-vessel coronary artery calcification. Global cardiac size is enlarged with biatrial enlargement noted. No pericardial effusion. The central pulmonary arteries are of normal caliber. There is no intraluminal filling defect identified to suggest acute pulmonary embolism. Extensive atherosclerotic calcification within the thoracic aorta. No aortic aneurysm. Mediastinum/Nodes: No enlarged mediastinal, hilar, or axillary lymph nodes. Thyroid gland, trachea, and esophagus demonstrate no significant findings. Lungs/Pleura: 11 mm right apical nodule, noted to be hypermetabolic on 08/13/2023 and compatible with patient's known primary bronchogenic malignancy appears stable. Interval development of surrounding architectural distortion with inter and intra lobular septal thickening at the right apex may reflect changes of radiation therapy. Interval development of multiple nodules within the left upper lobe measuring up to 13 x 18 mm axial # 62/6, nonspecific, possibly infectious though development of multiple intra pulmonary metastases is not excluded. The latter is considered less likely, however, given its relatively isolated distribution within the left upper lobe only. Superimposed mild emphysema. Small bilateral pleural effusions have developed. No pneumothorax. No central obstructing lesion. Upper Abdomen: No acute abnormality. Musculoskeletal: No acute bone abnormality. No lytic or blastic bone lesion. Review of the MIP images confirms the above findings. IMPRESSION: 1. No pulmonary embolism. 2. Stable 11 mm right apical nodule, noted to be hypermetabolic on 08/13/2023 and compatible with patient's known primary  bronchogenic malignancy. Interval development of surrounding architectural distortion with inter and intra lobular septal thickening at the right apex may reflect changes of radiation therapy. 3. Interval development of multiple nodules within the left upper lobe measuring up to 13 x 18 mm, nonspecific, possibly infectious though development of multiple intra pulmonary metastases is not excluded. The latter is considered less likely, given its relatively isolated distribution within the left upper lobe only. Short-term follow-up imaging at 3 months would be helpful in differentiating these entities 4. Interval development of small bilateral pleural effusions. 5. Extensive multi-vessel coronary artery calcification. 6. Mild edema Aortic Atherosclerosis (ICD10-I70.0) and Emphysema (ICD10-J43.9). Electronically Signed   By: Helyn Numbers M.D.   On: 11/20/2023 21:17   DG Chest Port 1 View Result Date: 11/20/2023 CLINICAL DATA:  Difficulty breathing. EXAM: PORTABLE CHEST 1 VIEW COMPARISON:  01/31/2021. FINDINGS: There are chronic nonspecific opacities throughout bilateral lungs including bilateral lung apices, favored to represent fibrosis/scarring. Bilateral lung fields are otherwise clear. No acute consolidation or lung collapse. Bilateral costophrenic angles are clear. Stable cardio-mediastinal silhouette. No acute osseous abnormalities. Right proximal humerus metallic hardware noted. The soft tissues are within normal limits. IMPRESSION: *No acute cardiopulmonary abnormality. Electronically Signed   By: Jules Schick M.D.   On: 11/20/2023 16:37     Assessment and Plan:   PAF Cardiology asked to see due to intermittent tachycardia with heart rates in the 130s to 140s.  Generally these are short episodes 10 to 20 minutes, asymptomatic in the setting of COPD exacerbation.  Otherwise by far the majority of  the time she maintains sinus rhythm with heart rates in the 70s.  Additionally, could be exacerbated by  steroids and bronchodilator use.  Would not make any changes, likely will improve with treatment of her COPD.  Furthermore with COPD and lung cancer she has limited options for further treatment but overall has done well maintaining sinus rhythm on current therapy. Continue Eliquis 5 mg twice daily, bisoprolol 10 mg, Tikosyn 250 mcg twice daily.  Stable QTc.  Chronic HFpEF Pulmonary hypertension Euvolemic on exam, shortness of breath more likely to be attributed to COPD.  Weight is 146 which is close to 144 noted outpatient.  Expiratory wheezing on exam and hypoxia on admission.   echo here does not show any significant changes from previous. Continue Lasix 20 mg twice daily.    Coronary calcifications on CTA CTA 09/18/2023 showed left main 25-49%, LAD 25-49%, normal D1 and D2, circumflex 25-49%, OM1 25-49% calcified plaque, RCA 50-69% followed by 25-49% mid/distal stenosis. FFR showed no obstructive coronary disease. No chest pain. Continue statin.    Lung cancer COPD exacerbation New nodules noted on chest CT scan.  Will follow-up with pulmonology.  Further management per primary team.  Risk Assessment/Risk Scores:   New York Heart Association (NYHA) Functional Class NYHA Class II  CHA2DS2-VASc Score = 6   This indicates a 9.7% annual risk of stroke. The patient's score is based upon: CHF History: 1 HTN History: 1 Diabetes History: 0 Stroke History: 0 Vascular Disease History: 1 Age Score: 2 Gender Score: 1   For questions or updates, please contact [facility] Please consult [email or web address] for contact info under    Signed, Abagail Kitchens, PA-C  11/23/2023 8:09 AM

## 2023-11-23 NOTE — Progress Notes (Signed)
PROGRESS NOTE    Holly Benson  YNW:[id number] DOB: 1933-05-26 DOA: 11/20/2023 PCP: Philip Aspen, Limmie Patricia, MD    Brief Narrative:  89 year old with history of non-small cell lung cancer on radiation, A-fib, COPD, HTN, diastolic CHF with EF 65%, HLD, OSA on CPAP, pulmonary hypertension comes to the ED with shortness of breath and cough.  Initially noted to be hypoxic.  CTA chest showed multiple new nodules and previously known nodule.  Admitted with concerns of COPD exacerbation   Assessment & Plan:  Principal Problem:   Acute hypoxemic respiratory failure (HCC) Active Problems:   COPD with acute exacerbation (HCC)   Essential hypertension   History of CAD (coronary artery disease)   History of lung cancer   Chronic diastolic CHF (congestive heart failure) (HCC)   Pulmonary hypertension (HCC)   Acute exacerbation of CHF (congestive heart failure) (HCC)    Acute hypoxic respiratory failure COPD exacerbation CTA chest concerning for new pulmonary nodules including previously known nodules.  BNP is also slightly elevated. - Continue empiric azithromycin, steroid, bronchodilator.  I-S/flutter valve.  Complete total 5 days of azithromycin. - Additional IV Lasix in addition to p.o.   Acute diastolic CHF exacerbation with preserved EF 65% History of pulmonary hypertension Essential hypertension Continue gentle diuresis as able. - Echocardiogram shows preserved EF with severely elevated PASP and dilated cardiomyopathy. -Continue antihypertensives.  IV as needed medications   History of non-small cell lung cancer on radiation therapy Existing and new pulmonary nodules noted on the CTA chest -Follows outpatient radiation oncology, Dr. Peri Jefferson    History of atrial fibrillation frequent RVR Continue home bisoprolol, Tikosyn, Eliquis These are brief burst.  Seen by cardiology. No further changes.  IV as needed Lopressor   Restless leg syndrome -Continue ropinirole 0.5 mg  3 times daily   Hyperlipidemia - Continue Crestor 20 mg daily   Generalized anxiety disorder -Continue clonazepam 0.5 at bedtime as needed.  PT/OT-home health services.  Face-to-face completed   DVT prophylaxis: Eliquis    Code Status: Full Code Family Communication:  family updated.  Status is: Inpatient Remains inpatient appropriate because: Hopefully home in next 24 hours   Subjective: Doing better no complaints.     Examination:  General exam: Appears calm and comfortable  Respiratory system: Some rhonchi heard Cardiovascular system: S1 & S2 heard, RRR. No JVD, murmurs, rubs, gallops or clicks. No pedal edema. Gastrointestinal system: Abdomen is nondistended, soft and nontender. No organomegaly or masses felt. Normal bowel sounds heard. Central nervous system: Alert and oriented. No focal neurological deficits. Extremities: Symmetric 5 x 5 power. Skin: No rashes, lesions or ulcers Psychiatry: Judgement and insight appear normal. Mood & affect appropriate.                Diet Orders (From admission, onward)     Start     Ordered   11/21/23 0236  Diet Heart Room service appropriate? Yes; Fluid consistency: Thin; Fluid restriction: 2000 mL Fluid  Diet effective now       Comments: Salt striction 2 g/day  Question Answer Comment  Room service appropriate? Yes   Fluid consistency: Thin   Fluid restriction: 2000 mL Fluid      11/21/23 0235            Objective: Vitals:   11/22/23 2052 11/22/23 2151 11/23/23 0506 11/23/23 1000  BP:  139/64 (!) 151/65   Pulse:  83 69   Resp:  15 15   Temp:  (!) 97.3 F (  36.3 C) (!) 97.4 F (36.3 C)   TempSrc:  Oral Oral   SpO2: 97% 94% 95% 93%  Weight:      Height:        Intake/Output Summary (Last 24 hours) at 11/23/2023 1223 Last data filed at 11/23/2023 0500 Gross per 24 hour  Intake 920 ml  Output 2000 ml  Net -1080 ml   Filed Weights   11/20/23 1526 11/21/23 0533  Weight: 65.6 kg 66.2 kg     Scheduled Meds:  apixaban  5 mg Oral BID   azithromycin  500 mg Oral Daily   bisoprolol  10 mg Oral QHS   docusate sodium  300 mg Oral QHS   dofetilide  250 mcg Oral BID   furosemide  20 mg Oral BID   guaiFENesin  600 mg Oral BID   ipratropium  0.5 mg Nebulization BID   irbesartan  300 mg Oral Daily   levalbuterol  0.63 mg Nebulization BID   methylPREDNISolone (SOLU-MEDROL) injection  40 mg Intravenous Q12H   pantoprazole  40 mg Oral Daily   polyethylene glycol  17 g Oral QHS   rOPINIRole  0.5 mg Oral TID   rosuvastatin  20 mg Oral Daily   Continuous Infusions:  Nutritional status     Body mass index is 26.07 kg/m.  Data Reviewed:   CBC: Recent Labs  Lab 11/20/23 1552 11/21/23 0820 11/22/23 0640 11/23/23 0448  WBC 8.2 7.0 11.3* 10.5  NEUTROABS 5.0  --   --   --   HGB 13.1 12.2 11.4* 12.5  HCT 39.4 37.1 35.5* 37.6  MCV 98.3 100.0 101.1* 100.3*  PLT 284 280 289 291   Basic Metabolic Panel: Recent Labs  Lab 11/20/23 1552 11/21/23 0820 11/22/23 0640 11/23/23 0448  NA 136 133* 134* 137  K 4.5 3.9 3.9 3.6  CL 102 101 103 100  CO2 25 26 26 26   GLUCOSE 100* 141* 105* 146*  BUN 18 16 23  31*  CREATININE 0.71 0.70 0.85 0.65  CALCIUM 9.6 8.7* 8.3* 8.8*  MG  --   --  2.3 2.5*  PHOS  --   --  3.3  --    GFR: Estimated Creatinine Clearance: 44.2 mL/min (by C-G formula based on SCr of 0.65 mg/dL). Liver Function Tests: Recent Labs  Lab 11/21/23 0820  AST 21  ALT 24  ALKPHOS 114  BILITOT 0.7  PROT 6.7  ALBUMIN 3.7   No results for input(s): "LIPASE", "AMYLASE" in the last 168 hours. No results for input(s): "AMMONIA" in the last 168 hours. Coagulation Profile: No results for input(s): "INR", "PROTIME" in the last 168 hours. Cardiac Enzymes: No results for input(s): "CKTOTAL", "CKMB", "CKMBINDEX", "TROPONINI" in the last 168 hours. BNP (last 3 results) No results for input(s): "PROBNP" in the last 8760 hours. HbA1C: No results for input(s):  "HGBA1C" in the last 72 hours. CBG: No results for input(s): "GLUCAP" in the last 168 hours. Lipid Profile: No results for input(s): "CHOL", "HDL", "LDLCALC", "TRIG", "CHOLHDL", "LDLDIRECT" in the last 72 hours. Thyroid Function Tests: No results for input(s): "TSH", "T4TOTAL", "FREET4", "T3FREE", "THYROIDAB" in the last 72 hours. Anemia Panel: No results for input(s): "VITAMINB12", "FOLATE", "FERRITIN", "TIBC", "IRON", "RETICCTPCT" in the last 72 hours. Sepsis Labs: Recent Labs  Lab 11/21/23 0820  PROCALCITON <0.10    Recent Results (from the past 240 hours)  Resp panel by RT-PCR (RSV, Flu A&B, Covid) Anterior Nasal Swab     Status: None   Collection Time:  11/20/23  3:31 PM   Specimen: Anterior Nasal Swab  Result Value Ref Range Status   SARS Coronavirus 2 by RT PCR NEGATIVE NEGATIVE Final    Comment: (NOTE) SARS-CoV-2 target nucleic acids are NOT DETECTED.  The SARS-CoV-2 RNA is generally detectable in upper respiratory specimens during the acute phase of infection. The lowest concentration of SARS-CoV-2 viral copies this assay can detect is 138 copies/mL. A negative result does not preclude SARS-Cov-2 infection and should not be used as the sole basis for treatment or other patient management decisions. A negative result may occur with  improper specimen collection/handling, submission of specimen other than nasopharyngeal swab, presence of viral mutation(s) within the areas targeted by this assay, and inadequate number of viral copies(<138 copies/mL). A negative result must be combined with clinical observations, patient history, and epidemiological information. The expected result is Negative.  Fact Sheet for Patients:  BloggerCourse.com  Fact Sheet for Healthcare Providers:  SeriousBroker.it  This test is no t yet approved or cleared by the [place] FDA and  has been authorized for detection and/or diagnosis of  SARS-CoV-2 by FDA under an Emergency Use Authorization (EUA). This EUA will remain  in effect (meaning this test can be used) for the duration of the COVID-19 declaration under Section 564(b)(1) of the Act, 21 U.S.C.section 360bbb-3(b)(1), unless the authorization is terminated  or revoked sooner.       Influenza A by PCR NEGATIVE NEGATIVE Final   Influenza B by PCR NEGATIVE NEGATIVE Final    Comment: (NOTE) The Xpert Xpress SARS-CoV-2/FLU/RSV plus assay is intended as an aid in the diagnosis of influenza from Nasopharyngeal swab specimens and should not be used as a sole basis for treatment. Nasal washings and aspirates are unacceptable for Xpert Xpress SARS-CoV-2/FLU/RSV testing.  Fact Sheet for Patients: BloggerCourse.com  Fact Sheet for Healthcare Providers: SeriousBroker.it  This test is not yet approved or cleared by the [place] FDA and has been authorized for detection and/or diagnosis of SARS-CoV-2 by FDA under an Emergency Use Authorization (EUA). This EUA will remain in effect (meaning this test can be used) for the duration of the COVID-19 declaration under Section 564(b)(1) of the Act, 21 U.S.C. section 360bbb-3(b)(1), unless the authorization is terminated or revoked.     Resp Syncytial Virus by PCR NEGATIVE NEGATIVE Final    Comment: (NOTE) Fact Sheet for Patients: BloggerCourse.com  Fact Sheet for Healthcare Providers: SeriousBroker.it  This test is not yet approved or cleared by the [place] FDA and has been authorized for detection and/or diagnosis of SARS-CoV-2 by FDA under an Emergency Use Authorization (EUA). This EUA will remain in effect (meaning this test can be used) for the duration of the COVID-19 declaration under Section 564(b)(1) of the Act, 21 U.S.C. section 360bbb-3(b)(1), unless the authorization is terminated  or revoked.  Performed at [facility], [address] [place], [place] [address]          Radiology Studies: No results found.         LOS: 2 days   Time spent= 35 mins    Miguel Rota, MD [facility]  If 7PM-7AM, please contact night-coverage  11/23/2023, 12:23 PM

## 2023-11-24 ENCOUNTER — Telehealth: Payer: Self-pay | Admitting: Internal Medicine

## 2023-11-24 ENCOUNTER — Ambulatory Visit (HOSPITAL_COMMUNITY): Payer: Medicare Other | Admitting: Physician Assistant

## 2023-11-24 DIAGNOSIS — J9601 Acute respiratory failure with hypoxia: Secondary | ICD-10-CM | POA: Diagnosis not present

## 2023-11-24 DIAGNOSIS — I480 Paroxysmal atrial fibrillation: Secondary | ICD-10-CM

## 2023-11-24 LAB — CBC
HCT: 39.4 % (ref 36.0–46.0)
Hemoglobin: 13.3 g/dL (ref 12.0–15.0)
MCH: 33.2 pg (ref 26.0–34.0)
MCHC: 33.8 g/dL (ref 30.0–36.0)
MCV: 98.3 fL (ref 80.0–100.0)
Platelets: 323 10*3/uL (ref 150–400)
RBC: 4.01 MIL/uL (ref 3.87–5.11)
RDW: 14.4 % (ref 11.5–15.5)
WBC: 10.7 10*3/uL — ABNORMAL HIGH (ref 4.0–10.5)
nRBC: 0 % (ref 0.0–0.2)

## 2023-11-24 LAB — BASIC METABOLIC PANEL
Anion gap: 7 (ref 5–15)
BUN: 32 mg/dL — ABNORMAL HIGH (ref 8–23)
CO2: 25 mmol/L (ref 22–32)
Calcium: 8.6 mg/dL — ABNORMAL LOW (ref 8.9–10.3)
Chloride: 102 mmol/L (ref 98–111)
Creatinine, Ser: 0.6 mg/dL (ref 0.44–1.00)
GFR, Estimated: >60 mL/min (ref >60)
Glucose, Bld: 133 mg/dL — ABNORMAL HIGH (ref 70–99)
Potassium: 4.5 mmol/L (ref 3.5–5.1)
Sodium: 134 mmol/L — ABNORMAL LOW (ref 135–145)

## 2023-11-24 LAB — MAGNESIUM: Magnesium: 2.6 mg/dL — ABNORMAL HIGH (ref 1.7–2.4)

## 2023-11-24 MED ORDER — AZITHROMYCIN 250 MG PO TABS: 250.0000 mg | ORAL_TABLET | Freq: Every day | ORAL | 0 refills | Status: AC

## 2023-11-24 MED ORDER — PREDNISONE 50 MG PO TABS: 50.0000 mg | ORAL_TABLET | Freq: Every day | ORAL | 0 refills | Status: AC

## 2023-11-24 MED ORDER — FUROSEMIDE 20 MG PO TABS: ORAL_TABLET | ORAL | 3 refills | Status: DC

## 2023-11-24 NOTE — Progress Notes (Signed)
Cardiology will sign off.  No changes in current regiment or any other recommendations.  I have scheduled her follow-up appointment with our A-fib clinic in 1 to 2 weeks.  Please let us know if you have any other questions.

## 2023-11-24 NOTE — Plan of Care (Signed)

## 2023-11-24 NOTE — Telephone Encounter (Signed)
*  STAT* If patient is at the pharmacy, call can be transferred to refill team.   1. Which medications need to be refilled? (please list name of each medication and dose if known) furosemide (LASIX) 20 MG tablet  TAKE 1 TABLET BY MOUTH ON MONDAY, WEDNESDAY, AND FRIDAY. TAKE 2 TABLETS BY MOUTH ON ALL OTHER DAYS.  2. Would you like to learn more about the convenience, safety, & potential cost savings by using the [facility]? No   3. Are you open to using the [facility] No   4. Which pharmacy/location (including street and city if local pharmacy) is medication to be sent to?[facility] [id number] - [place], [place] - [address]    5. Do they need a 30 day or 90 day supply? 90 Day Supply  Pt is currently out of medication

## 2023-11-24 NOTE — Progress Notes (Signed)
PROGRESS NOTE    Holly Benson  WJX:[id number] DOB: 06/13/1933 DOA: 11/20/2023 PCP: Philip Aspen, Limmie Patricia, MD    Brief Narrative:  89 year old with history of non-small cell lung cancer on radiation, A-fib, COPD, HTN, diastolic CHF with EF 65%, HLD, OSA on CPAP, pulmonary hypertension comes to the ED with shortness of breath and cough.  Initially noted to be hypoxic.  CTA chest showed multiple new nodules and previously known nodule.  Admitted with concerns of COPD exacerbation.  She also required gentle diuresis during hospitalization.  Otherwise received aggressive bronchodilators, azithromycin and steroids.  She was also seen by cardiology at her request and no further changes were recommended. Over the course of several days patient was doing significantly well therefore transition to oral steroid and she will be discharged home in stable condition today. Family has been updated throughout her hospital stay and on the day of discharge.  Assessment & Plan:  Principal Problem:   Acute hypoxemic respiratory failure (HCC) Active Problems:   COPD with acute exacerbation (HCC)   Essential hypertension   History of CAD (coronary artery disease)   History of lung cancer   Chronic diastolic CHF (congestive heart failure) (HCC)   Pulmonary hypertension (HCC)   Acute exacerbation of CHF (congestive heart failure) (HCC)    Acute hypoxic respiratory failure COPD exacerbation CTA chest concerning for new pulmonary nodules including previously known nodules.  BNP is also slightly elevated. - Continue empiric azithromycin, steroid, bronchodilator.  I-S/flutter valve.  Complete total 5 days of azithromycin.  1 more day - Now on p.o. Lasix   Acute diastolic CHF exacerbation with preserved EF 65% History of pulmonary hypertension Essential hypertension Continue gentle diuresis as able.  Continue home p.o. Lasix - Echocardiogram shows preserved EF with severely elevated PASP and dilated  cardiomyopathy. - Seen by cardiology, follow-up outpatient   History of non-small cell lung cancer on radiation therapy Existing and new pulmonary nodules noted on the CTA chest -Follows outpatient radiation oncology, Dr. Peri Jefferson    History of atrial fibrillation frequent RVR Continue home bisoprolol, Tikosyn, Eliquis These are brief burst.  Seen by cardiology. No further changes.  Follow-up outpatient   Restless leg syndrome -Continue ropinirole 0.5 mg 3 times daily   Hyperlipidemia - Continue Crestor 20 mg daily   Generalized anxiety disorder -Continue clonazepam 0.5 at bedtime as needed.  PT/OT-home health services.  Face-to-face completed   DVT prophylaxis: Eliquis    Code Status: Full Code Family Communication:  family updated.  Status is: Inpatient Remains inpatient appropriate because: Discharge today   Subjective:  Patient feels significantly well today does not have any complaints.  Also discussed with patient's family member over the phone while I was in the room.  All the questions have been answered by me.   Examination:  General exam: Appears calm and comfortable  Respiratory system: Clear to to auscultation bilaterally Cardiovascular system: S1 & S2 heard, RRR. No JVD, murmurs, rubs, gallops or clicks. No pedal edema. Gastrointestinal system: Abdomen is nondistended, soft and nontender. No organomegaly or masses felt. Normal bowel sounds heard. Central nervous system: Alert and oriented. No focal neurological deficits. Extremities: Symmetric 5 x 5 power. Skin: No rashes, lesions or ulcers Psychiatry: Judgement and insight appear normal. Mood & affect appropriate.                Diet Orders (From admission, onward)     Start     Ordered   11/21/23 0236  Diet Heart Room  service appropriate? Yes; Fluid consistency: Thin; Fluid restriction: 2000 mL Fluid  Diet effective now       Comments: Salt striction 2 g/day  Question Answer Comment  Room  service appropriate? Yes   Fluid consistency: Thin   Fluid restriction: 2000 mL Fluid      11/21/23 0235            Objective: Vitals:   11/23/23 2037 11/24/23 0531 11/24/23 0810 11/24/23 1013  BP: 130/66 133/71    Pulse: 81 81 80   Resp: 18 16    Temp: 98 F (36.7 C) 97.7 F (36.5 C)    TempSrc: Oral Oral    SpO2: 93% 93%  92%  Weight:      Height:        Intake/Output Summary (Last 24 hours) at 11/24/2023 1259 Last data filed at 11/24/2023 0810 Gross per 24 hour  Intake 720 ml  Output 2100 ml  Net -1380 ml   Filed Weights   11/20/23 1526 11/21/23 0533  Weight: 65.6 kg 66.2 kg    Scheduled Meds:  apixaban  5 mg Oral BID   azithromycin  500 mg Oral Daily   bisoprolol  10 mg Oral QHS   docusate sodium  300 mg Oral QHS   dofetilide  250 mcg Oral BID   furosemide  20 mg Oral BID   guaiFENesin  600 mg Oral BID   ipratropium  0.5 mg Nebulization BID   irbesartan  300 mg Oral Daily   levalbuterol  0.63 mg Nebulization BID   methylPREDNISolone (SOLU-MEDROL) injection  40 mg Intravenous Q12H   pantoprazole  40 mg Oral Daily   polyethylene glycol  17 g Oral QHS   rOPINIRole  0.5 mg Oral TID   rosuvastatin  20 mg Oral Daily   Continuous Infusions:  Nutritional status     Body mass index is 26.07 kg/m.  Data Reviewed:   CBC: Recent Labs  Lab 11/20/23 1552 11/21/23 0820 11/22/23 0640 11/23/23 0448 11/24/23 0537  WBC 8.2 7.0 11.3* 10.5 10.7*  NEUTROABS 5.0  --   --   --   --   HGB 13.1 12.2 11.4* 12.5 13.3  HCT 39.4 37.1 35.5* 37.6 39.4  MCV 98.3 100.0 101.1* 100.3* 98.3  PLT 284 280 289 291 323   Basic Metabolic Panel: Recent Labs  Lab 11/20/23 1552 11/21/23 0820 11/22/23 0640 11/23/23 0448 11/24/23 0537  NA 136 133* 134* 137 134*  K 4.5 3.9 3.9 3.6 4.5  CL 102 101 103 100 102  CO2 25 26 26 26 25   GLUCOSE 100* 141* 105* 146* 133*  BUN 18 16 23  31* 32*  CREATININE 0.71 0.70 0.85 0.65 0.60  CALCIUM 9.6 8.7* 8.3* 8.8* 8.6*  MG  --   --   2.3 2.5* 2.6*  PHOS  --   --  3.3  --   --    GFR: Estimated Creatinine Clearance: 44.2 mL/min (by C-G formula based on SCr of 0.6 mg/dL). Liver Function Tests: Recent Labs  Lab 11/21/23 0820  AST 21  ALT 24  ALKPHOS 114  BILITOT 0.7  PROT 6.7  ALBUMIN 3.7   No results for input(s): "LIPASE", "AMYLASE" in the last 168 hours. No results for input(s): "AMMONIA" in the last 168 hours. Coagulation Profile: No results for input(s): "INR", "PROTIME" in the last 168 hours. Cardiac Enzymes: No results for input(s): "CKTOTAL", "CKMB", "CKMBINDEX", "TROPONINI" in the last 168 hours. BNP (last 3 results) No results for  input(s): "PROBNP" in the last 8760 hours. HbA1C: No results for input(s): "HGBA1C" in the last 72 hours. CBG: No results for input(s): "GLUCAP" in the last 168 hours. Lipid Profile: No results for input(s): "CHOL", "HDL", "LDLCALC", "TRIG", "CHOLHDL", "LDLDIRECT" in the last 72 hours. Thyroid Function Tests: No results for input(s): "TSH", "T4TOTAL", "FREET4", "T3FREE", "THYROIDAB" in the last 72 hours. Anemia Panel: No results for input(s): "VITAMINB12", "FOLATE", "FERRITIN", "TIBC", "IRON", "RETICCTPCT" in the last 72 hours. Sepsis Labs: Recent Labs  Lab 11/21/23 0820  PROCALCITON <0.10    Recent Results (from the past 240 hours)  Resp panel by RT-PCR (RSV, Flu A&B, Covid) Anterior Nasal Swab     Status: None   Collection Time: 11/20/23  3:31 PM   Specimen: Anterior Nasal Swab  Result Value Ref Range Status   SARS Coronavirus 2 by RT PCR NEGATIVE NEGATIVE Final    Comment: (NOTE) SARS-CoV-2 target nucleic acids are NOT DETECTED.  The SARS-CoV-2 RNA is generally detectable in upper respiratory specimens during the acute phase of infection. The lowest concentration of SARS-CoV-2 viral copies this assay can detect is 138 copies/mL. A negative result does not preclude SARS-Cov-2 infection and should not be used as the sole basis for treatment or other patient  management decisions. A negative result may occur with  improper specimen collection/handling, submission of specimen other than nasopharyngeal swab, presence of viral mutation(s) within the areas targeted by this assay, and inadequate number of viral copies(<138 copies/mL). A negative result must be combined with clinical observations, patient history, and epidemiological information. The expected result is Negative.  Fact Sheet for Patients:  BloggerCourse.com  Fact Sheet for Healthcare Providers:  SeriousBroker.it  This test is no t yet approved or cleared by the [place] FDA and  has been authorized for detection and/or diagnosis of SARS-CoV-2 by FDA under an Emergency Use Authorization (EUA). This EUA will remain  in effect (meaning this test can be used) for the duration of the COVID-19 declaration under Section 564(b)(1) of the Act, 21 U.S.C.section 360bbb-3(b)(1), unless the authorization is terminated  or revoked sooner.       Influenza A by PCR NEGATIVE NEGATIVE Final   Influenza B by PCR NEGATIVE NEGATIVE Final    Comment: (NOTE) The Xpert Xpress SARS-CoV-2/FLU/RSV plus assay is intended as an aid in the diagnosis of influenza from Nasopharyngeal swab specimens and should not be used as a sole basis for treatment. Nasal washings and aspirates are unacceptable for Xpert Xpress SARS-CoV-2/FLU/RSV testing.  Fact Sheet for Patients: BloggerCourse.com  Fact Sheet for Healthcare Providers: SeriousBroker.it  This test is not yet approved or cleared by the [place] FDA and has been authorized for detection and/or diagnosis of SARS-CoV-2 by FDA under an Emergency Use Authorization (EUA). This EUA will remain in effect (meaning this test can be used) for the duration of the COVID-19 declaration under Section 564(b)(1) of the Act, 21 U.S.C. section 360bbb-3(b)(1),  unless the authorization is terminated or revoked.     Resp Syncytial Virus by PCR NEGATIVE NEGATIVE Final    Comment: (NOTE) Fact Sheet for Patients: BloggerCourse.com  Fact Sheet for Healthcare Providers: SeriousBroker.it  This test is not yet approved or cleared by the [place] FDA and has been authorized for detection and/or diagnosis of SARS-CoV-2 by FDA under an Emergency Use Authorization (EUA). This EUA will remain in effect (meaning this test can be used) for the duration of the COVID-19 declaration under Section 564(b)(1) of the Act, 21 U.S.C. section 360bbb-3(b)(1),  unless the authorization is terminated or revoked.  Performed at [facility], [address] [place], [place] [address]          Radiology Studies: No results found.         LOS: 3 days   Time spent= 35 mins    Miguel Rota, MD [facility]  If 7PM-7AM, please contact night-coverage  11/24/2023, 12:59 PM

## 2023-11-24 NOTE — Progress Notes (Signed)
Physical Therapy Treatment/Discharge Note Patient Details Name: Holly Benson MRN: [id number] DOB: 12-31-1932 Today's Date: 11/24/2023   History of Present Illness Patient is a 89 year old who presented to the ED with shortness of breath and cough.  Initially noted to be hypoxic.  CTA chest showed multiple new nodules and previously known nodule.  Admitted with concerns of COPD exacerbation. PMH: non-small cell lung cancer on radiation, A-fib, COPD, HTN, diastolic CHF with EF 65%, HLD, OSA on CPAP, pulmonary hypertension    PT Comments  Pt agreeable to working with therapy. She reports she has been ambulating halls independently. O2 90% on RA on today. Will discharge pt from PT caseload at this time. Do not anticipate any f/u PT needs after discharge.     If plan is discharge home, recommend the following:     Can travel by private vehicle        Equipment Recommendations  None recommended by PT    Recommendations for Other Services       Precautions / Restrictions Precautions Precautions: Fall Restrictions Weight Bearing Restrictions Per Provider Order: No     Mobility  Bed Mobility               General bed mobility comments: NT pt in recliner    Transfers Overall transfer level: Modified independent                      Ambulation/Gait Ambulation/Gait assistance: Modified independent (Device/Increase time) Gait Distance (Feet): 350 Feet Assistive device: None Gait Pattern/deviations: Step-through pattern       General Gait Details: intermittent drifting, staggering. no overt lob. tolerated distance well. 1 brief standing rest break mid distance. O2 90% on RA.   Stairs             Wheelchair Mobility     Tilt Bed    Modified Rankin (Stroke Patients Only)       Balance             Standing balance-Leahy Scale: Fair                              Cognition Arousal: Alert Behavior During Therapy: WFL for  tasks assessed/performed Overall Cognitive Status: Within Functional Limits for tasks assessed                                          Exercises      General Comments        Pertinent Vitals/Pain Pain Assessment Pain Assessment: No/denies pain    Home Living                          Prior Function            PT Goals (current goals can now be found in the care plan section) Progress towards PT goals: Goals met/education completed, patient discharged from PT    Frequency    Min 1X/week      PT Plan      Co-evaluation              AM-PAC PT "6 Clicks" Mobility   Outcome Measure  Help needed turning from your back to your side while in a flat bed without using bedrails?: None Help needed moving from lying on  your back to sitting on the side of a flat bed without using bedrails?: None Help needed moving to and from a bed to a chair (including a wheelchair)?: None Help needed standing up from a chair using your arms (e.g., wheelchair or bedside chair)?: None Help needed to walk in hospital room?: None Help needed climbing 3-5 steps with a railing? : A Little 6 Click Score: 23    End of Session   Activity Tolerance: Patient tolerated treatment well Patient left: in chair;with call bell/phone within reach   PT Visit Diagnosis: Difficulty in walking, not elsewhere classified (R26.2)     Time: 1042-1050 PT Time Calculation (min) (ACUTE ONLY): 8 min  Charges:    $Gait Training: 8-22 mins PT General Charges $$ ACUTE PT VISIT: 1 Visit                         Faye Ramsay, PT Acute Rehabilitation  Office: [phone]

## 2023-11-24 NOTE — Telephone Encounter (Signed)
Pt's medication was sent to pt's pharmacy as requested. Confirmation received.  °

## 2023-11-24 NOTE — Discharge Summary (Signed)
Physician Discharge Summary  Holly Benson ZOX:[id number] DOB: Jul 18, 1933 DOA: 11/20/2023  PCP: Philip Aspen, Limmie Patricia, MD  Admit date: 11/20/2023 Discharge date: 11/24/2023  Admitted From: Home Disposition:  Home  Recommendations for Outpatient Follow-up:  Follow up with PCP in 1-2 weeks Please obtain BMP/CBC in one week your next doctors visit.  Prednisone for 3 more days Azithromycin for one day.   = Discharge Condition: Stable CODE STATUS: Full Diet recommendation: Regular  Brief/Interim Summary: Brief Narrative:  89 year old with history of non-small cell lung cancer on radiation, A-fib, COPD, HTN, diastolic CHF with EF 65%, HLD, OSA on CPAP, pulmonary hypertension comes to the ED with shortness of breath and cough.  Initially noted to be hypoxic.  CTA chest showed multiple new nodules and previously known nodule.  Admitted with concerns of COPD exacerbation.  She also required gentle diuresis during hospitalization.  Otherwise received aggressive bronchodilators, azithromycin and steroids.  She was also seen by cardiology at her request and no further changes were recommended. Over the course of several days patient was doing significantly well therefore transition to oral steroid and she will be discharged home in stable condition today. Family has been updated throughout her hospital stay and on the day of discharge.  Assessment & Plan:  Principal Problem:   Acute hypoxemic respiratory failure (HCC) Active Problems:   COPD with acute exacerbation (HCC)   Essential hypertension   History of CAD (coronary artery disease)   History of lung cancer   Chronic diastolic CHF (congestive heart failure) (HCC)   Pulmonary hypertension (HCC)   Acute exacerbation of CHF (congestive heart failure) (HCC)    Acute hypoxic respiratory failure COPD exacerbation CTA chest concerning for new pulmonary nodules including previously known nodules.  BNP is also slightly elevated. -  Continue empiric azithromycin, steroid, bronchodilator.  I-S/flutter valve.  Complete total 5 days of azithromycin.  1 more day - Now on p.o. Lasix   Acute diastolic CHF exacerbation with preserved EF 65% History of pulmonary hypertension Essential hypertension Continue gentle diuresis as able.  Continue home p.o. Lasix - Echocardiogram shows preserved EF with severely elevated PASP and dilated cardiomyopathy. - Seen by cardiology, follow-up outpatient   History of non-small cell lung cancer on radiation therapy Existing and new pulmonary nodules noted on the CTA chest -Follows outpatient radiation oncology, Dr. Peri Jefferson    History of atrial fibrillation frequent RVR Continue home bisoprolol, Tikosyn, Eliquis These are brief burst.  Seen by cardiology. No further changes.  Follow-up outpatient   Restless leg syndrome -Continue ropinirole 0.5 mg 3 times daily   Hyperlipidemia - Continue Crestor 20 mg daily   Generalized anxiety disorder -Continue clonazepam 0.5 at bedtime as needed.  PT/OT-home health services.  Face-to-face completed   DVT prophylaxis: Eliquis    Code Status: Full Code Family Communication:  family updated.  Status is: Inpatient Remains inpatient appropriate because: Discharge today   Subjective:  Patient feels significantly well today does not have any complaints.  Also discussed with patient's family member over the phone while I was in the room.  All the questions have been answered by me.   Examination:  General exam: Appears calm and comfortable  Respiratory system: Clear to to auscultation bilaterally Cardiovascular system: S1 & S2 heard, RRR. No JVD, murmurs, rubs, gallops or clicks. No pedal edema. Gastrointestinal system: Abdomen is nondistended, soft and nontender. No organomegaly or masses felt. Normal bowel sounds heard. Central nervous system: Alert and oriented. No focal neurological deficits. Extremities: Symmetric  5 x 5 power. Skin:  No rashes, lesions or ulcers Psychiatry: Judgement and insight appear normal. Mood & affect appropriate.    Discharge Diagnoses:  Principal Problem:   Acute hypoxemic respiratory failure (HCC) Active Problems:   COPD with acute exacerbation (HCC)   Essential hypertension   History of CAD (coronary artery disease)   History of lung cancer   Chronic diastolic CHF (congestive heart failure) (HCC)   Pulmonary hypertension (HCC)   Acute exacerbation of CHF (congestive heart failure) (HCC)      Discharge Exam: Vitals:   11/24/23 0810 11/24/23 1013  BP:    Pulse: 80   Resp:    Temp:    SpO2:  92%   Vitals:   11/23/23 2037 11/24/23 0531 11/24/23 0810 11/24/23 1013  BP: 130/66 133/71    Pulse: 81 81 80   Resp: 18 16    Temp: 98 F (36.7 C) 97.7 F (36.5 C)    TempSrc: Oral Oral    SpO2: 93% 93%  92%  Weight:      Height:          Discharge Instructions   Allergies as of 11/24/2023       Reactions   Lovenox [enoxaparin Sodium] Hives, Rash   PT STATES SHE BROKE OUT IN A RASH HEAD TO TOE AND LASTED ABOUT 3 WEEKS    Metoprolol Succinate [metoprolol] Other (See Comments)   Muscle aches, hand pain, and tingling        Medication List     TAKE these medications    acetaminophen 325 MG tablet Commonly known as: TYLENOL Take 650 mg by mouth as needed for moderate pain or headache.   albuterol 108 (90 Base) MCG/ACT inhaler Commonly known as: VENTOLIN HFA INHALE TWO PUFFS BY MOUTH EVERY 6 HOURS AS NEEDED FOR WHEEZING OR FOR SHORTNESS OF BREATH   amLODipine 5 MG tablet Commonly known as: NORVASC Take 1 tablet (5 mg total) by mouth daily.   apixaban 5 MG Tabs tablet Commonly known as: Eliquis Take 1 tablet (5 mg total) by mouth 2 (two) times daily.   azithromycin 250 MG tablet Commonly known as: ZITHROMAX Take 1 tablet (250 mg total) by mouth daily for 1 day. What changed:  how much to take how to take this when to take this additional instructions    benzonatate 100 MG capsule Commonly known as: TESSALON Take 1 capsule (100 mg total) by mouth 2 (two) times daily as needed for cough.   bisoprolol 10 MG tablet Commonly known as: ZEBETA TAKE 1 TABLET BY MOUTH AT BEDTIME   clonazePAM 0.5 MG tablet Commonly known as: KLONOPIN Take 0.5 tablets (0.25 mg total) by mouth at bedtime as needed for anxiety. What changed: reasons to take this   Colon Cleanse Caps Take 4 capsules by mouth as needed (constipation).   docusate sodium 100 MG capsule Commonly known as: COLACE Take 300 mg by mouth daily as needed for moderate constipation.   dofetilide 250 MCG capsule Commonly known as: TIKOSYN TAKE 1 CAPSULE BY MOUTH 2 TIMES A DAY.   furosemide 20 MG tablet Commonly known as: LASIX TAKE 1 TABLET BY MOUTH ON MONDAY, WEDNESDAY, AND FRIDAY. TAKE 2 TABLETS BY MOUTH ON ALL OTHER DAYS.   irbesartan 300 MG tablet Commonly known as: AVAPRO TAKE 1 TABLET BY MOUTH DAILY   MAGNESIUM OXIDE PO Take 1 tablet by mouth daily.   polyethylene glycol 17 g packet Commonly known as: MIRALAX / GLYCOLAX Take 17 g  by mouth daily as needed for moderate constipation.   predniSONE 50 MG tablet Commonly known as: DELTASONE Take 1 tablet (50 mg total) by mouth daily for 3 doses.   PRESCRIPTION MEDICATION at bedtime as needed (Sometimes). CPAP   PreserVision AREDS 2 Caps Take 1 tablet by mouth 2 (two) times daily.   rOPINIRole 0.5 MG tablet Commonly known as: REQUIP One in AM and one after 3 Pm and one after dinner 8 Pm What changed:  how much to take how to take this when to take this additional instructions   rosuvastatin 20 MG tablet Commonly known as: CRESTOR Take 1 tablet (20 mg total) by mouth daily.   trimethoprim-polymyxin b ophthalmic solution Commonly known as: Polytrim Place 1 drop into both eyes every 4 (four) hours.   Vitamin D3 Maximum Strength 125 MCG (5000 UT) capsule Generic drug: Cholecalciferol Take 5,000 Units by mouth  daily.        Follow-up Information     Fenton, Clint R, PA Follow up.   Specialty: Cardiology Why: 12/02/2023 at 11:00 Contact information: [address] [place] [address] [phone]         Philip Aspen, Limmie Patricia, MD Follow up in 1 week(s).   Specialty: Internal Medicine Contact information: [address] [place] [address] [phone]                Allergies  Allergen Reactions   Lovenox [Enoxaparin Sodium] Hives and Rash    PT STATES SHE BROKE OUT IN A RASH HEAD TO TOE AND LASTED ABOUT 3 WEEKS    Metoprolol Succinate [Metoprolol] Other (See Comments)    Muscle aches, hand pain, and tingling    You were cared for by a hospitalist during your hospital stay. If you have any questions about your discharge medications or the care you received while you were in the hospital after you are discharged, you can call the unit and asked to speak with the hospitalist on call if the hospitalist that took care of you is not available. Once you are discharged, your primary care physician will handle any further medical issues. Please note that no refills for any discharge medications will be authorized once you are discharged, as it is imperative that you return to your primary care physician (or establish a relationship with a primary care physician if you do not have one) for your aftercare needs so that they can reassess your need for medications and monitor your lab values.  You were cared for by a hospitalist during your hospital stay. If you have any questions about your discharge medications or the care you received while you were in the hospital after you are discharged, you can call the unit and asked to speak with the hospitalist on call if the hospitalist that took care of you is not available. Once you are discharged, your primary care physician will handle any further medical issues. Please note that NO REFILLS for any discharge medications will be  authorized once you are discharged, as it is imperative that you return to your primary care physician (or establish a relationship with a primary care physician if you do not have one) for your aftercare needs so that they can reassess your need for medications and monitor your lab values.  Please request your Prim.MD to go over all Hospital Tests and Procedure/Radiological results at the follow up, please get all Hospital records sent to your Prim MD by signing hospital release before you go  home.  Get CBC, CMP, 2 view Chest X ray checked  by Primary MD during your next visit or SNF MD in 5-7 days ( we routinely change or add medications that can affect your baseline labs and fluid status, therefore we recommend that you get the mentioned basic workup next visit with your PCP, your PCP may decide not to get them or add new tests based on their clinical decision)  On your next visit with your primary care physician please Get Medicines reviewed and adjusted.  If you experience worsening of your admission symptoms, develop shortness of breath, life threatening emergency, suicidal or homicidal thoughts you must seek medical attention immediately by calling 911 or calling your MD immediately  if symptoms less severe.  You Must read complete instructions/literature along with all the possible adverse reactions/side effects for all the Medicines you take and that have been prescribed to you. Take any new Medicines after you have completely understood and accpet all the possible adverse reactions/side effects.   Do not drive, operate heavy machinery, perform activities at heights, swimming or participation in water activities or provide baby sitting services if your were admitted for syncope or siezures until you have seen by Primary MD or a Neurologist and advised to do so again.  Do not drive when taking Pain medications.   Procedures/Studies: ECHOCARDIOGRAM COMPLETE Result Date: 11/21/2023     ECHOCARDIOGRAM REPORT   Patient Name:   ETTAMAE SHEPPERD [facility] Date of Exam: 11/21/2023 Medical Rec #:  [id number]           Height:       62.8 in Accession #:    [id number]          Weight:       146.0 lb Date of Birth:  [birth year]           BSA:          1.687 m Patient Age:    89 years            BP:           137/70 mmHg Patient Gender: F                   HR:           81 bpm. Exam Location:  Inpatient Procedure: 2D Echo, Color Doppler, Cardiac Doppler and 3D Echo Indications:    CHF- Acute Diastolic  History:        Patient has prior history of Echocardiogram examinations, most                 recent 02/03/2023. COPD, Signs/Symptoms:Shortness of Breath; Risk                 Factors:Pulmonary Hypertension, Hypertension, Dyslipidemia and                 Sleep Apnea.  Sonographer:    Raeford Razor RDCS Referring Phys: [id number] SUBRINA SUNDIL IMPRESSIONS  1. Left ventricular ejection fraction, by estimation, is 60 to 65%. The left ventricle has normal function. The left ventricle has no regional wall motion abnormalities. Left ventricular diastolic parameters are indeterminate.  2. Right ventricular systolic function is normal. The right ventricular size is mildly enlarged. There is severely elevated pulmonary artery systolic pressure.  3. Left atrial size was severely dilated.  4. Right atrial size was severely dilated.  5. The mitral valve is normal in structure. Mild mitral valve regurgitation. No evidence of mitral stenosis.  6.  Tricuspid valve regurgitation is moderate.  7. The aortic valve is tricuspid. Aortic valve regurgitation is mild. No aortic stenosis is present.  8. The inferior vena cava is dilated in size with >50% respiratory variability, suggesting right atrial pressure of 8 mmHg. FINDINGS  Left Ventricle: Left ventricular ejection fraction, by estimation, is 60 to 65%. The left ventricle has normal function. The left ventricle has no regional wall motion abnormalities. The left ventricular internal cavity  size was normal in size. There is  no left ventricular hypertrophy. Left ventricular diastolic parameters are indeterminate. Right Ventricle: The right ventricular size is mildly enlarged. Right ventricular systolic function is normal. There is severely elevated pulmonary artery systolic pressure. The tricuspid regurgitant velocity is 3.70 m/s, and with an assumed right atrial  pressure of 8 mmHg, the estimated right ventricular systolic pressure is 62.8 mmHg. Left Atrium: Left atrial size was severely dilated. Right Atrium: Right atrial size was severely dilated. Pericardium: There is no evidence of pericardial effusion. Mitral Valve: The mitral valve is normal in structure. Mild mitral annular calcification. Mild mitral valve regurgitation. No evidence of mitral valve stenosis. Tricuspid Valve: The tricuspid valve is normal in structure. Tricuspid valve regurgitation is moderate . No evidence of tricuspid stenosis. Aortic Valve: The aortic valve is tricuspid. Aortic valve regurgitation is mild. Aortic regurgitation PHT measures 320 msec. No aortic stenosis is present. Aortic valve mean gradient measures 4.0 mmHg. Aortic valve peak gradient measures 7.1 mmHg. Aortic  valve area, by VTI measures 2.07 cm. Pulmonic Valve: The pulmonic valve was normal in structure. Pulmonic valve regurgitation is not visualized. No evidence of pulmonic stenosis. Aorta: The aortic root is normal in size and structure. Venous: The inferior vena cava is dilated in size with greater than 50% respiratory variability, suggesting right atrial pressure of 8 mmHg. IAS/Shunts: No atrial level shunt detected by color flow Doppler.  LEFT VENTRICLE PLAX 2D LVIDd:         4.10 cm   Diastology LVIDs:         2.90 cm   LV e' medial:   5.98 cm/s LV PW:         1.10 cm   LV E/e' medial: 21.4 LV IVS:        1.00 cm LVOT diam:     1.90 cm LV SV:         63 LV SV Index:   37 LVOT Area:     2.84 cm  3D Volume EF:                          3D EF:        64  %                          LV EDV:       92 ml                          LV ESV:       33 ml                          LV SV:        59 ml RIGHT VENTRICLE             IVC RV Basal diam:  3.60 cm     IVC diam: 2.50 cm RV Mid  diam:    2.60 cm RV S prime:     11.10 cm/s TAPSE (M-mode): 1.8 cm LEFT ATRIUM              Index        RIGHT ATRIUM           Index LA diam:        4.20 cm  2.49 cm/m   RA Area:     32.30 cm LA Vol (A2C):   102.0 ml 60.46 ml/m  RA Volume:   116.00 ml 68.76 ml/m LA Vol (A4C):   106.0 ml 62.83 ml/m LA Biplane Vol: 110.0 ml 65.21 ml/m  AORTIC VALVE AV Area (Vmax):    2.15 cm AV Area (Vmean):   2.07 cm AV Area (VTI):     2.07 cm AV Vmax:           133.00 cm/s AV Vmean:          93.700 cm/s AV VTI:            0.306 m AV Peak Grad:      7.1 mmHg AV Mean Grad:      4.0 mmHg LVOT Vmax:         101.00 cm/s LVOT Vmean:        68.300 cm/s LVOT VTI:          0.223 m LVOT/AV VTI ratio: 0.73 AI PHT:            320 msec  AORTA Ao Root diam: 3.10 cm Ao Asc diam:  2.80 cm MITRAL VALVE                TRICUSPID VALVE MV Area (PHT): 4.36 cm     TR Peak grad:   54.8 mmHg MV Decel Time: 174 msec     TR Vmax:        370.00 cm/s MV E velocity: 128.00 cm/s                             SHUNTS                             Systemic VTI:  0.22 m                             Systemic Diam: 1.90 cm Olga Millers MD Electronically signed by Olga Millers MD Signature Date/Time: 11/21/2023/2:10:06 PM    Final    CT Angio Chest PE W and/or Wo Contrast Result Date: 11/20/2023 CLINICAL DATA:  High probability pulmonary embolism, dyspnea, lung cancer. * Tracking Code: BO * EXAM: CT ANGIOGRAPHY CHEST WITH CONTRAST TECHNIQUE: Multidetector CT imaging of the chest was performed using the standard protocol during bolus administration of intravenous contrast. Multiplanar CT image reconstructions and MIPs were obtained to evaluate the vascular anatomy. RADIATION DOSE REDUCTION: This exam was performed according to the  departmental dose-optimization program which includes automated exposure control, adjustment of the mA and/or kV according to patient size and/or use of iterative reconstruction technique. CONTRAST:  75mL OMNIPAQUE IOHEXOL 350 MG/ML SOLN COMPARISON:  09/18/2023 FINDINGS: Cardiovascular: Extensive multi-vessel coronary artery calcification. Global cardiac size is enlarged with biatrial enlargement noted. No pericardial effusion. The central pulmonary arteries are of normal caliber. There is no intraluminal filling defect identified to suggest acute pulmonary embolism. Extensive atherosclerotic calcification within the thoracic aorta.  No aortic aneurysm. Mediastinum/Nodes: No enlarged mediastinal, hilar, or axillary lymph nodes. Thyroid gland, trachea, and esophagus demonstrate no significant findings. Lungs/Pleura: 11 mm right apical nodule, noted to be hypermetabolic on 08/13/2023 and compatible with patient's known primary bronchogenic malignancy appears stable. Interval development of surrounding architectural distortion with inter and intra lobular septal thickening at the right apex may reflect changes of radiation therapy. Interval development of multiple nodules within the left upper lobe measuring up to 13 x 18 mm axial # 62/6, nonspecific, possibly infectious though development of multiple intra pulmonary metastases is not excluded. The latter is considered less likely, however, given its relatively isolated distribution within the left upper lobe only. Superimposed mild emphysema. Small bilateral pleural effusions have developed. No pneumothorax. No central obstructing lesion. Upper Abdomen: No acute abnormality. Musculoskeletal: No acute bone abnormality. No lytic or blastic bone lesion. Review of the MIP images confirms the above findings. IMPRESSION: 1. No pulmonary embolism. 2. Stable 11 mm right apical nodule, noted to be hypermetabolic on 08/13/2023 and compatible with patient's known primary  bronchogenic malignancy. Interval development of surrounding architectural distortion with inter and intra lobular septal thickening at the right apex may reflect changes of radiation therapy. 3. Interval development of multiple nodules within the left upper lobe measuring up to 13 x 18 mm, nonspecific, possibly infectious though development of multiple intra pulmonary metastases is not excluded. The latter is considered less likely, given its relatively isolated distribution within the left upper lobe only. Short-term follow-up imaging at 3 months would be helpful in differentiating these entities 4. Interval development of small bilateral pleural effusions. 5. Extensive multi-vessel coronary artery calcification. 6. Mild edema Aortic Atherosclerosis (ICD10-I70.0) and Emphysema (ICD10-J43.9). Electronically Signed   By: Helyn Numbers M.D.   On: 11/20/2023 21:17   DG Chest Port 1 View Result Date: 11/20/2023 CLINICAL DATA:  Difficulty breathing. EXAM: PORTABLE CHEST 1 VIEW COMPARISON:  01/31/2021. FINDINGS: There are chronic nonspecific opacities throughout bilateral lungs including bilateral lung apices, favored to represent fibrosis/scarring. Bilateral lung fields are otherwise clear. No acute consolidation or lung collapse. Bilateral costophrenic angles are clear. Stable cardio-mediastinal silhouette. No acute osseous abnormalities. Right proximal humerus metallic hardware noted. The soft tissues are within normal limits. IMPRESSION: *No acute cardiopulmonary abnormality. Electronically Signed   By: Jules Schick M.D.   On: 11/20/2023 16:37   DG Bone Density Result Date: 11/05/2023 EXAM: DUAL X-RAY ABSORPTIOMETRY (DXA) FOR BONE MINERAL DENSITY IMPRESSION: Referring Physician:  Micael Hampshire ACOSTA Your patient completed a bone mineral density test using GE Lunar iDXA system (analysis version: 16). Technologist: ALW PATIENT: Name: Antia, Berish Patient ID: [id number] Birth Date: 1932/11/14 Height:  62.0 in. Sex: Female Measured: 11/05/2023 Weight: 142.6 lbs. Indications: Advanced Age, Bilateral Ovariectomy, Caucasian, COPD, Estrogen Deficiency, Height Loss, History of Fracture (Adult), Hysterectomy, Post Menopausal Fractures: Humerus Treatments: ASSESSMENT: The BMD measured at Left Forearm Radius 33% is 0.611 g/cm2 with a T-score of -3.0. This patient is considered osteoporotic according to World Health Organization [facility]) criteria. The scan quality is good. The lumbar spine was excluded. Site Region Measured Date Measured Age YA BMD Significant CHANGE T-score Left Forearm Radius 33% 11/05/2023 88.2 -3.0 0.611 g/cm2 DualFemur Neck Left 11/05/2023 88.2 -2.3 0.715 g/cm2 DualFemur Total Mean 11/05/2023 88.2 -1.7 0.793 g/cm2 World Health Organization [facility]) criteria for post-menopausal, Caucasian Women: Normal       T-score at or above -1 SD Osteopenia   T-score between -1 and -2.5 SD Osteoporosis T-score at or below -2.5 SD  RECOMMENDATION: 1. All patients should optimize calcium and vitamin D intake. 2. Consider FDA approved medical therapies in postmenopausal women and men aged 57 years and older, based on the following: a. A hip or vertebral (clinical or morphometric) fracture b. T-score = -2.5 at the femoral neck or spine after appropriate evaluation to exclude secondary causes c. Low bone mass (T-score between -1.0 and -2.5 at the femoral neck or spine) and a 10- year probability of a hip fracture = 3% or a 10 year probability of a major osteoporosis-related fracture = 20% based on the [place]-adapted WHO algorithm. 3. Clinician judgement and/or patient preference may indicate treatment for people with10-year fracture probabilities above or below these levels. FOLLOW-UP: Patients with diagnosis of osteoporosis or at high risk for fracture should have regular bone mineral density tests. For patients eligible for Medicare routine testing is allowed once every 2 years. The testing frequency can be increased to one year  for patients who have rapidly progressing disease, those who are receiving or discontinuing medical therapy to restore bone mass, or have additional risk factors. I have reviewed this study and agree with the findings. [facility] Electronically Signed   By: Romona Curls M.D.   On: 11/05/2023 09:22     The results of significant diagnostics from this hospitalization (including imaging, microbiology, ancillary and laboratory) are listed below for reference.     Microbiology: Recent Results (from the past 240 hours)  Resp panel by RT-PCR (RSV, Flu A&B, Covid) Anterior Nasal Swab     Status: None   Collection Time: 11/20/23  3:31 PM   Specimen: Anterior Nasal Swab  Result Value Ref Range Status   SARS Coronavirus 2 by RT PCR NEGATIVE NEGATIVE Final    Comment: (NOTE) SARS-CoV-2 target nucleic acids are NOT DETECTED.  The SARS-CoV-2 RNA is generally detectable in upper respiratory specimens during the acute phase of infection. The lowest concentration of SARS-CoV-2 viral copies this assay can detect is 138 copies/mL. A negative result does not preclude SARS-Cov-2 infection and should not be used as the sole basis for treatment or other patient management decisions. A negative result may occur with  improper specimen collection/handling, submission of specimen other than nasopharyngeal swab, presence of viral mutation(s) within the areas targeted by this assay, and inadequate number of viral copies(<138 copies/mL). A negative result must be combined with clinical observations, patient history, and epidemiological information. The expected result is Negative.  Fact Sheet for Patients:  BloggerCourse.com  Fact Sheet for Healthcare Providers:  SeriousBroker.it  This test is no t yet approved or cleared by the [place] FDA and  has been authorized for detection and/or diagnosis of SARS-CoV-2 by FDA under an Emergency  Use Authorization (EUA). This EUA will remain  in effect (meaning this test can be used) for the duration of the COVID-19 declaration under Section 564(b)(1) of the Act, 21 U.S.C.section 360bbb-3(b)(1), unless the authorization is terminated  or revoked sooner.       Influenza A by PCR NEGATIVE NEGATIVE Final   Influenza B by PCR NEGATIVE NEGATIVE Final    Comment: (NOTE) The Xpert Xpress SARS-CoV-2/FLU/RSV plus assay is intended as an aid in the diagnosis of influenza from Nasopharyngeal swab specimens and should not be used as a sole basis for treatment. Nasal washings and aspirates are unacceptable for Xpert Xpress SARS-CoV-2/FLU/RSV testing.  Fact Sheet for Patients: BloggerCourse.com  Fact Sheet for Healthcare Providers: SeriousBroker.it  This test is not yet approved or cleared by the [place] FDA and has been authorized for detection and/or diagnosis of SARS-CoV-2 by FDA under an Emergency Use Authorization (EUA). This EUA will remain in effect (meaning this test can be used) for the duration of the COVID-19 declaration under Section 564(b)(1) of the Act, 21 U.S.C. section 360bbb-3(b)(1), unless the authorization is terminated or revoked.     Resp Syncytial Virus by PCR NEGATIVE NEGATIVE Final    Comment: (NOTE) Fact Sheet for Patients: BloggerCourse.com  Fact Sheet for Healthcare Providers: SeriousBroker.it  This test is not yet approved or cleared by the [place] FDA and has been authorized for detection and/or diagnosis of SARS-CoV-2 by FDA under an Emergency Use Authorization (EUA). This EUA will remain in effect (meaning this test can be used) for the duration of the COVID-19 declaration under Section 564(b)(1) of the Act, 21 U.S.C. section 360bbb-3(b)(1), unless the authorization is terminated or revoked.  Performed at [facility],  [address] [place], [place] [address]      Labs: BNP (last 3 results) Recent Labs    03/24/23 1445 11/20/23 1552 11/22/23 0640  BNP 891.4* 796.9* 739.6*   Basic Metabolic Panel: Recent Labs  Lab 11/20/23 1552 11/21/23 0820 11/22/23 0640 11/23/23 0448 11/24/23 0537  NA 136 133* 134* 137 134*  K 4.5 3.9 3.9 3.6 4.5  CL 102 101 103 100 102  CO2 25 26 26 26 25   GLUCOSE 100* 141* 105* 146* 133*  BUN 18 16 23  31* 32*  CREATININE 0.71 0.70 0.85 0.65 0.60  CALCIUM 9.6 8.7* 8.3* 8.8* 8.6*  MG  --   --  2.3 2.5* 2.6*  PHOS  --   --  3.3  --   --    Liver Function Tests: Recent Labs  Lab 11/21/23 0820  AST 21  ALT 24  ALKPHOS 114  BILITOT 0.7  PROT 6.7  ALBUMIN 3.7   No results for input(s): "LIPASE", "AMYLASE" in the last 168 hours. No results for input(s): "AMMONIA" in the last 168 hours. CBC: Recent Labs  Lab 11/20/23 1552 11/21/23 0820 11/22/23 0640 11/23/23 0448 11/24/23 0537  WBC 8.2 7.0 11.3* 10.5 10.7*  NEUTROABS 5.0  --   --   --   --   HGB 13.1 12.2 11.4* 12.5 13.3  HCT 39.4 37.1 35.5* 37.6 39.4  MCV 98.3 100.0 101.1* 100.3* 98.3  PLT 284 280 289 291 323   Cardiac Enzymes: No results for input(s): "CKTOTAL", "CKMB", "CKMBINDEX", "TROPONINI" in the last 168 hours. BNP: Invalid input(s): "POCBNP" CBG: No results for input(s): "GLUCAP" in the last 168 hours. D-Dimer No results for input(s): "DDIMER" in the last 72 hours. Hgb A1c No results for input(s): "HGBA1C" in the last 72 hours. Lipid Profile No results for input(s): "CHOL", "HDL", "LDLCALC", "TRIG", "CHOLHDL", "LDLDIRECT" in the last 72 hours. Thyroid function studies No results for input(s): "TSH", "T4TOTAL", "T3FREE", "THYROIDAB" in the last 72 hours.  Invalid input(s): "FREET3" Anemia work up No results for input(s): "VITAMINB12", "FOLATE", "FERRITIN", "TIBC", "IRON", "RETICCTPCT" in the last 72 hours. Urinalysis    Component Value Date/Time   COLORURINE YELLOW 09/02/2021  1230   APPEARANCEUR HAZY (A) 09/02/2021 1230   LABSPEC 1.022 09/02/2021 1230   PHURINE 7.5 09/02/2021 1230   GLUCOSEU NEGATIVE 09/02/2021 1230   HGBUR NEGATIVE 09/02/2021 1230   BILIRUBINUR NEGATIVE 09/02/2021 1230   KETONESUR NEGATIVE 09/02/2021 1230   PROTEINUR TRACE (A) 09/02/2021 1230   NITRITE POSITIVE (A) 09/02/2021 1230   LEUKOCYTESUR SMALL (A) 09/02/2021 1230  Sepsis Labs Recent Labs  Lab 11/21/23 0820 11/22/23 0640 11/23/23 0448 11/24/23 0537  WBC 7.0 11.3* 10.5 10.7*   Microbiology Recent Results (from the past 240 hours)  Resp panel by RT-PCR (RSV, Flu A&B, Covid) Anterior Nasal Swab     Status: None   Collection Time: 11/20/23  3:31 PM   Specimen: Anterior Nasal Swab  Result Value Ref Range Status   SARS Coronavirus 2 by RT PCR NEGATIVE NEGATIVE Final    Comment: (NOTE) SARS-CoV-2 target nucleic acids are NOT DETECTED.  The SARS-CoV-2 RNA is generally detectable in upper respiratory specimens during the acute phase of infection. The lowest concentration of SARS-CoV-2 viral copies this assay can detect is 138 copies/mL. A negative result does not preclude SARS-Cov-2 infection and should not be used as the sole basis for treatment or other patient management decisions. A negative result may occur with  improper specimen collection/handling, submission of specimen other than nasopharyngeal swab, presence of viral mutation(s) within the areas targeted by this assay, and inadequate number of viral copies(<138 copies/mL). A negative result must be combined with clinical observations, patient history, and epidemiological information. The expected result is Negative.  Fact Sheet for Patients:  BloggerCourse.com  Fact Sheet for Healthcare Providers:  SeriousBroker.it  This test is no t yet approved or cleared by the [place] FDA and  has been authorized for detection and/or diagnosis of SARS-CoV-2 by FDA  under an Emergency Use Authorization (EUA). This EUA will remain  in effect (meaning this test can be used) for the duration of the COVID-19 declaration under Section 564(b)(1) of the Act, 21 U.S.C.section 360bbb-3(b)(1), unless the authorization is terminated  or revoked sooner.       Influenza A by PCR NEGATIVE NEGATIVE Final   Influenza B by PCR NEGATIVE NEGATIVE Final    Comment: (NOTE) The Xpert Xpress SARS-CoV-2/FLU/RSV plus assay is intended as an aid in the diagnosis of influenza from Nasopharyngeal swab specimens and should not be used as a sole basis for treatment. Nasal washings and aspirates are unacceptable for Xpert Xpress SARS-CoV-2/FLU/RSV testing.  Fact Sheet for Patients: BloggerCourse.com  Fact Sheet for Healthcare Providers: SeriousBroker.it  This test is not yet approved or cleared by the [place] FDA and has been authorized for detection and/or diagnosis of SARS-CoV-2 by FDA under an Emergency Use Authorization (EUA). This EUA will remain in effect (meaning this test can be used) for the duration of the COVID-19 declaration under Section 564(b)(1) of the Act, 21 U.S.C. section 360bbb-3(b)(1), unless the authorization is terminated or revoked.     Resp Syncytial Virus by PCR NEGATIVE NEGATIVE Final    Comment: (NOTE) Fact Sheet for Patients: BloggerCourse.com  Fact Sheet for Healthcare Providers: SeriousBroker.it  This test is not yet approved or cleared by the [place] FDA and has been authorized for detection and/or diagnosis of SARS-CoV-2 by FDA under an Emergency Use Authorization (EUA). This EUA will remain in effect (meaning this test can be used) for the duration of the COVID-19 declaration under Section 564(b)(1) of the Act, 21 U.S.C. section 360bbb-3(b)(1), unless the authorization is terminated or revoked.  Performed at [facility], [address] [place], [place] [address]      Time coordinating discharge:  I have spent 35 minutes face to face with the patient and on the ward discussing the patients care, assessment, plan and disposition with other care givers. >50% of the time was devoted counseling the patient about the risks and benefits of  treatment/Discharge disposition and coordinating care.   SIGNED:   Miguel Rota, MD  [facility] 11/24/2023, 1:31 PM   If 7PM-7AM, please contact night-coverage

## 2023-11-24 NOTE — Progress Notes (Signed)
AVS reviewed with PT no inquiries made. PT daughter-in-law will pick her up and escort her to lobby via wheelchair. No complaints of pain, SOB or discomfort.

## 2023-12-02 ENCOUNTER — Encounter (HOSPITAL_COMMUNITY): Payer: Self-pay

## 2023-12-02 ENCOUNTER — Other Ambulatory Visit: Payer: Self-pay | Admitting: *Deleted

## 2023-12-02 ENCOUNTER — Ambulatory Visit (HOSPITAL_COMMUNITY): Payer: Medicare Other | Admitting: Physician Assistant

## 2023-12-02 DIAGNOSIS — I739 Peripheral vascular disease, unspecified: Secondary | ICD-10-CM

## 2023-12-03 ENCOUNTER — Encounter: Payer: Self-pay | Admitting: Internal Medicine

## 2023-12-03 ENCOUNTER — Ambulatory Visit (INDEPENDENT_AMBULATORY_CARE_PROVIDER_SITE_OTHER): Payer: Medicare Other | Admitting: Internal Medicine

## 2023-12-03 VITALS — BP 127/69 | HR 83 | Temp 97.8°F | Resp 18 | Ht 62.75 in | Wt 138.5 lb

## 2023-12-03 DIAGNOSIS — J441 Chronic obstructive pulmonary disease with (acute) exacerbation: Secondary | ICD-10-CM | POA: Diagnosis not present

## 2023-12-03 DIAGNOSIS — Z09 Encounter for follow-up examination after completed treatment for conditions other than malignant neoplasm: Secondary | ICD-10-CM

## 2023-12-03 NOTE — Progress Notes (Signed)
Established Patient Office Visit     CC/Reason for Visit: Hospital discharge follow-up  HPI: Holly Benson is a 89 y.o. female who is coming in today for the above mentioned reasons. Past Medical History is significant for: Atrial fibrillation, non-small cell lung cancer on radiation therapy, COPD, hypertension, diastolic CHF, OSA on CPAP, pulmonary hypertension.  She presented to the hospital on 1/17 due to shortness of breath and was found to be hypoxemic.  She was thought to have COPD with acute exacerbation.  She was admitted and treated with steroids and IV antibiotics.  She was discharged on the 21st.  She is feeling much improved.  Still feels fatigued.   Past Medical/Surgical History: Past Medical History:  Diagnosis Date   Arrhythmia    Arthritis    Atrial fibrillation (HCC)    Constipation    chronic   COPD (chronic obstructive pulmonary disease) (HCC)    History of radiation therapy    Right lung- 09/22/23-09/29/23- Dr. Antony Blackbird   Hyperlipidemia    Hypertension    pulmonary   OSA on CPAP    Osteoarthritis    Osteoporosis    Peripheral neuropathy    Pulmonary hypertension (HCC)    RLS (restless legs syndrome)    Sleep apnea     Past Surgical History:  Procedure Laterality Date   ABDOMINAL AORTOGRAM W/LOWER EXTREMITY N/A 03/21/2020   Procedure: ABDOMINAL AORTOGRAM W/ Bilateral LOWER EXTREMITY Run[place];  Surgeon: Iran Ouch, MD;  Location: [facility];  Service: Cardiovascular;  Laterality: N/A;   ABDOMINAL HYSTERECTOMY Bilateral    arm surgery Right    Broken arm and has a plate in it   CARDIOVERSION N/A 03/12/2023   Procedure: CARDIOVERSION;  Surgeon: Quintella Reichert, MD;  Location: [facility];  Service: Cardiovascular;  Laterality: N/A;   CATARACT EXTRACTION Bilateral    COLONOSCOPY     More than 10 years ago [facility]   HERNIA REPAIR  08/2022   KNEE ARTHROSCOPY Right    PERIPHERAL VASCULAR INTERVENTION Bilateral  03/21/2020   Procedure: PERIPHERAL VASCULAR INTERVENTION;  Surgeon: Iran Ouch, MD;  Location: [facility];  Service: Cardiovascular;  Laterality: Bilateral;  external iliac   TONSILLECTOMY     ventral herniorrhaphy  2023   mesh used    Social History:  reports that she quit smoking about 32 years ago. Her smoking use included cigarettes. She started smoking about 69 years ago. She has a 55.5 pack-year smoking history. She has never used smokeless tobacco. She reports current alcohol use of about 7.0 standard drinks of alcohol per week. She reports that she does not use drugs.  Allergies: Allergies  Allergen Reactions   Lovenox [Enoxaparin Sodium] Hives and Rash    PT STATES SHE BROKE OUT IN A RASH HEAD TO TOE AND LASTED ABOUT 3 WEEKS    Metoprolol Succinate [Metoprolol] Other (See Comments)    Muscle aches, hand pain, and tingling    Family History:  Family History  Problem Relation Age of Onset   Suicidality Father    Stroke Maternal Grandfather    Hypertension Sister    Colon cancer Neg Hx    Esophageal cancer Neg Hx    Pancreatic cancer Neg Hx    Stomach cancer Neg Hx    Liver disease Neg Hx      Current Outpatient Medications:    acetaminophen (TYLENOL) 325 MG tablet, Take 650 mg by mouth as needed  for moderate pain or headache., Disp: , Rfl:    albuterol (VENTOLIN HFA) 108 (90 Base) MCG/ACT inhaler, INHALE TWO PUFFS BY MOUTH EVERY 6 HOURS AS NEEDED FOR WHEEZING OR FOR SHORTNESS OF BREATH, Disp: 18 g, Rfl: 6   amLODipine (NORVASC) 5 MG tablet, Take 1 tablet (5 mg total) by mouth daily., Disp: 30 tablet, Rfl: 7   apixaban (ELIQUIS) 5 MG TABS tablet, Take 1 tablet (5 mg total) by mouth 2 (two) times daily., Disp: 20 tablet, Rfl: 0   bisoprolol (ZEBETA) 10 MG tablet, TAKE 1 TABLET BY MOUTH AT BEDTIME, Disp: 90 tablet, Rfl: 3   Cholecalciferol (VITAMIN D3 MAXIMUM STRENGTH) 125 MCG (5000 UT) capsule, Take 5,000 Units by mouth daily., Disp: , Rfl:    clonazePAM  (KLONOPIN) 0.5 MG tablet, Take 0.5 tablets (0.25 mg total) by mouth at bedtime as needed for anxiety. (Patient taking differently: Take 0.25 mg by mouth at bedtime as needed (Restless leg).), Disp: 30 tablet, Rfl: 2   docusate sodium (COLACE) 100 MG capsule, Take 300 mg by mouth daily as needed for moderate constipation., Disp: , Rfl:    dofetilide (TIKOSYN) 250 MCG capsule, TAKE 1 CAPSULE BY MOUTH 2 TIMES A DAY., Disp: 180 capsule, Rfl: 2   furosemide (LASIX) 20 MG tablet, Take 1 tablet by mouth on Monday, Wednesday and Friday. Take 2 tablets by mouth on all other days, Disp: 132 tablet, Rfl: 3   irbesartan (AVAPRO) 300 MG tablet, TAKE 1 TABLET BY MOUTH DAILY, Disp: 90 tablet, Rfl: 1   MAGNESIUM OXIDE PO, Take 1 tablet by mouth daily., Disp: , Rfl:    Misc Natural Products (COLON CLEANSE) CAPS, Take 4 capsules by mouth as needed (constipation)., Disp: , Rfl:    Multiple Vitamins-Minerals (PRESERVISION AREDS 2) CAPS, Take 1 tablet by mouth 2 (two) times daily., Disp: , Rfl:    polyethylene glycol (MIRALAX / GLYCOLAX) 17 g packet, Take 17 g by mouth daily as needed for moderate constipation., Disp: , Rfl:    PRESCRIPTION MEDICATION, at bedtime as needed (Sometimes). CPAP, Disp: , Rfl:    rOPINIRole (REQUIP) 0.5 MG tablet, One in AM and one after 3 Pm and one after dinner 8 Pm (Patient taking differently: Take 0.5 mg by mouth 3 (three) times daily.), Disp: 90 tablet, Rfl: 5   rosuvastatin (CRESTOR) 20 MG tablet, Take 1 tablet (20 mg total) by mouth daily., Disp: 90 tablet, Rfl: 3   benzonatate (TESSALON) 100 MG capsule, Take 1 capsule (100 mg total) by mouth 2 (two) times daily as needed for cough. (Patient not taking: Reported on 11/21/2023), Disp: 20 capsule, Rfl: 0   trimethoprim-polymyxin b (POLYTRIM) ophthalmic solution, Place 1 drop into both eyes every 4 (four) hours. (Patient not taking: Reported on 11/21/2023), Disp: 10 mL, Rfl: 0 No current facility-administered medications for this  visit.  Facility-Administered Medications Ordered in Other Visits:    technetium tetrofosmin (TC-MYOVIEW) injection 31.9 millicurie, 31.9 millicurie, Intravenous, Once PRN, Chilton Si, MD  Review of Systems:  Negative unless indicated in HPI.   Physical Exam: Vitals:   12/03/23 1309  BP: 127/69  Pulse: 83  Resp: 18  Temp: 97.8 F (36.6 C)  TempSrc: Oral  SpO2: 96%  Weight: 138 lb 8 oz (62.8 kg)  Height: 5' 2.75" (1.594 m)    Body mass index is 24.73 kg/m.   Physical Exam Vitals reviewed.  Constitutional:      Appearance: Normal appearance.  HENT:     Head: Normocephalic and atraumatic.  Eyes:     Conjunctiva/sclera: Conjunctivae normal.  Cardiovascular:     Rate and Rhythm: Normal rate and regular rhythm.  Pulmonary:     Effort: Pulmonary effort is normal.     Breath sounds: Normal breath sounds.  Skin:    General: Skin is warm and dry.  Neurological:     General: No focal deficit present.     Mental Status: She is alert and oriented to person, place, and time.  Psychiatric:        Mood and Affect: Mood normal.        Behavior: Behavior normal.        Thought Content: Thought content normal.        Judgment: Judgment normal.      Impression and Plan:  Hospital discharge follow-up  COPD with acute exacerbation [facility])  [facility] charts reviewed in detail. -She is feeling improved.  She has completed antibiotics and steroids prescribed on discharge. -She has an appointment with pulmonary in February.   Time spent:30 minutes reviewing chart, interviewing and examining patient and formulating plan of care.     Chaya Jan, MD [facility]

## 2023-12-07 ENCOUNTER — Emergency Department (HOSPITAL_COMMUNITY): Payer: Medicare Other

## 2023-12-07 ENCOUNTER — Encounter (HOSPITAL_COMMUNITY): Payer: Self-pay

## 2023-12-07 ENCOUNTER — Observation Stay (HOSPITAL_COMMUNITY)
Admission: EM | Admit: 2023-12-07 | Discharge: 2023-12-08 | Disposition: A | Discharge status: Home / Self Care | Payer: Medicare Other | Attending: Family Medicine | Admitting: Family Medicine

## 2023-12-07 ENCOUNTER — Ambulatory Visit: Payer: Medicare Other | Admitting: Internal Medicine

## 2023-12-07 ENCOUNTER — Other Ambulatory Visit: Payer: Self-pay

## 2023-12-07 DIAGNOSIS — G4733 Obstructive sleep apnea (adult) (pediatric): Secondary | ICD-10-CM | POA: Diagnosis not present

## 2023-12-07 DIAGNOSIS — I5030 Unspecified diastolic (congestive) heart failure: Secondary | ICD-10-CM | POA: Insufficient documentation

## 2023-12-07 DIAGNOSIS — A4189 Other specified sepsis: Secondary | ICD-10-CM | POA: Insufficient documentation

## 2023-12-07 DIAGNOSIS — Z7951 Long term (current) use of inhaled steroids: Secondary | ICD-10-CM | POA: Diagnosis not present

## 2023-12-07 DIAGNOSIS — J441 Chronic obstructive pulmonary disease with (acute) exacerbation: Secondary | ICD-10-CM | POA: Diagnosis not present

## 2023-12-07 DIAGNOSIS — I480 Paroxysmal atrial fibrillation: Secondary | ICD-10-CM | POA: Insufficient documentation

## 2023-12-07 DIAGNOSIS — G2581 Restless legs syndrome: Secondary | ICD-10-CM | POA: Diagnosis not present

## 2023-12-07 DIAGNOSIS — Z7901 Long term (current) use of anticoagulants: Secondary | ICD-10-CM | POA: Diagnosis not present

## 2023-12-07 DIAGNOSIS — J9601 Acute respiratory failure with hypoxia: Secondary | ICD-10-CM | POA: Insufficient documentation

## 2023-12-07 DIAGNOSIS — Z87891 Personal history of nicotine dependence: Secondary | ICD-10-CM | POA: Insufficient documentation

## 2023-12-07 DIAGNOSIS — J101 Influenza due to other identified influenza virus with other respiratory manifestations: Secondary | ICD-10-CM | POA: Diagnosis not present

## 2023-12-07 DIAGNOSIS — R918 Other nonspecific abnormal finding of lung field: Secondary | ICD-10-CM | POA: Diagnosis not present

## 2023-12-07 DIAGNOSIS — Z20822 Contact with and (suspected) exposure to covid-19: Secondary | ICD-10-CM | POA: Diagnosis not present

## 2023-12-07 DIAGNOSIS — E785 Hyperlipidemia, unspecified: Secondary | ICD-10-CM | POA: Insufficient documentation

## 2023-12-07 DIAGNOSIS — I110 Hypertensive heart disease with heart failure: Secondary | ICD-10-CM | POA: Insufficient documentation

## 2023-12-07 DIAGNOSIS — R0602 Shortness of breath: Secondary | ICD-10-CM | POA: Diagnosis not present

## 2023-12-07 LAB — CBC
HCT: 36.6 % (ref 36.0–46.0)
Hemoglobin: 11.7 g/dL — ABNORMAL LOW (ref 12.0–15.0)
MCH: 32.5 pg (ref 26.0–34.0)
MCHC: 32 g/dL (ref 30.0–36.0)
MCV: 101.7 fL — ABNORMAL HIGH (ref 80.0–100.0)
Platelets: 166 K/uL (ref 150–400)
RBC: 3.6 MIL/uL — ABNORMAL LOW (ref 3.87–5.11)
RDW: 14.6 % (ref 11.5–15.5)
WBC: 5.9 K/uL (ref 4.0–10.5)
nRBC: 0 % (ref 0.0–0.2)

## 2023-12-07 LAB — TROPONIN I (HIGH SENSITIVITY)
Troponin I (High Sensitivity): 15 ng/L (ref <18)
Troponin I (High Sensitivity): 15 ng/L (ref <18)

## 2023-12-07 LAB — BASIC METABOLIC PANEL
Anion gap: 10 (ref 5–15)
BUN: 22 mg/dL (ref 8–23)
CO2: 24 mmol/L (ref 22–32)
Calcium: 8.6 mg/dL — ABNORMAL LOW (ref 8.9–10.3)
Chloride: 102 mmol/L (ref 98–111)
Creatinine, Ser: 0.79 mg/dL (ref 0.44–1.00)
GFR, Estimated: >60 mL/min (ref >60)
Glucose, Bld: 99 mg/dL (ref 70–99)
Potassium: 4.3 mmol/L (ref 3.5–5.1)
Sodium: 136 mmol/L (ref 135–145)

## 2023-12-07 LAB — RESP PANEL BY RT-PCR (RSV, FLU A&B, COVID)  RVPGX2
Influenza A by PCR: POSITIVE — AB
Influenza B by PCR: NEGATIVE
Resp Syncytial Virus by PCR: NEGATIVE
SARS Coronavirus 2 by RT PCR: NEGATIVE

## 2023-12-07 LAB — BRAIN NATRIURETIC PEPTIDE: B Natriuretic Peptide: 756.6 pg/mL — ABNORMAL HIGH (ref 0.0–100.0)

## 2023-12-07 MED ORDER — ROPINIROLE HCL 1 MG PO TABS
0.5000 mg | ORAL_TABLET | Freq: Three times a day (TID) | ORAL | Status: DC
  Administered 2023-12-07 – 2023-12-08 (×3): 0.5 mg via ORAL
  Filled 2023-12-07 (×3): qty 1

## 2023-12-07 MED ORDER — ALBUTEROL SULFATE (2.5 MG/3ML) 0.083% IN NEBU
2.5000 mg | INHALATION_SOLUTION | Freq: Once | RESPIRATORY_TRACT | Status: AC
  Administered 2023-12-07: 2.5 mg via RESPIRATORY_TRACT
  Filled 2023-12-07: qty 3

## 2023-12-07 MED ORDER — ACETAMINOPHEN 325 MG PO TABS: 650.0000 mg | ORAL_TABLET | Freq: Four times a day (QID) | ORAL | Status: DC | PRN

## 2023-12-07 MED ORDER — METHYLPREDNISOLONE SODIUM SUCC 125 MG IJ SOLR
125.0000 mg | Freq: Once | INTRAMUSCULAR | Status: AC
  Administered 2023-12-07: 125 mg via INTRAVENOUS
  Filled 2023-12-07: qty 2

## 2023-12-07 MED ORDER — ONDANSETRON HCL 4 MG/2ML IJ SOLN: 4.0000 mg | Freq: Four times a day (QID) | INTRAMUSCULAR | Status: DC | PRN

## 2023-12-07 MED ORDER — OSELTAMIVIR PHOSPHATE 30 MG PO CAPS
30.0000 mg | ORAL_CAPSULE | Freq: Two times a day (BID) | ORAL | Status: DC
  Administered 2023-12-07 – 2023-12-08 (×2): 30 mg via ORAL
  Filled 2023-12-07 (×2): qty 1

## 2023-12-07 MED ORDER — ACETAMINOPHEN 650 MG RE SUPP: 650.0000 mg | Freq: Four times a day (QID) | RECTAL | Status: DC | PRN

## 2023-12-07 MED ORDER — ROSUVASTATIN CALCIUM 20 MG PO TABS
20.0000 mg | ORAL_TABLET | Freq: Every day | ORAL | Status: DC
Start: 2023-12-07 — End: 2023-12-07

## 2023-12-07 MED ORDER — IPRATROPIUM-ALBUTEROL 0.5-2.5 (3) MG/3ML IN SOLN
3.0000 mL | Freq: Once | RESPIRATORY_TRACT | Status: AC
  Administered 2023-12-07: 3 mL via RESPIRATORY_TRACT
  Filled 2023-12-07: qty 3

## 2023-12-07 MED ORDER — ROSUVASTATIN CALCIUM 10 MG PO TABS
20.0000 mg | ORAL_TABLET | Freq: Every day | ORAL | Status: DC
Start: 2023-12-08 — End: 2023-12-08
  Administered 2023-12-08: 20 mg via ORAL
  Filled 2023-12-07: qty 2

## 2023-12-07 MED ORDER — ONDANSETRON HCL 4 MG PO TABS: 4.0000 mg | ORAL_TABLET | Freq: Four times a day (QID) | ORAL | Status: DC | PRN

## 2023-12-07 MED ORDER — APIXABAN 5 MG PO TABS
5.0000 mg | ORAL_TABLET | Freq: Two times a day (BID) | ORAL | Status: DC
Start: 2023-12-07 — End: 2023-12-08
  Administered 2023-12-07 – 2023-12-08 (×2): 5 mg via ORAL
  Filled 2023-12-07 (×2): qty 1

## 2023-12-07 MED ORDER — IPRATROPIUM-ALBUTEROL 0.5-2.5 (3) MG/3ML IN SOLN
3.0000 mL | Freq: Four times a day (QID) | RESPIRATORY_TRACT | Status: DC
  Administered 2023-12-07: 3 mL via RESPIRATORY_TRACT
  Filled 2023-12-07 (×2): qty 3

## 2023-12-07 MED ORDER — MAGNESIUM SULFATE 2 GM/50ML IV SOLN
2.0000 g | Freq: Once | INTRAVENOUS | Status: AC
  Administered 2023-12-07: 2 g via INTRAVENOUS
  Filled 2023-12-07: qty 50

## 2023-12-07 MED ORDER — SODIUM CHLORIDE 0.9 % IV BOLUS
250.0000 mL | Freq: Once | INTRAVENOUS | Status: AC
  Administered 2023-12-07: 250 mL via INTRAVENOUS

## 2023-12-07 MED ORDER — CLONAZEPAM 0.125 MG PO TBDP: 0.2500 mg | ORAL_TABLET | Freq: Every evening | ORAL | Status: DC | PRN

## 2023-12-07 MED ORDER — PREDNISONE 20 MG PO TABS
40.0000 mg | ORAL_TABLET | Freq: Every day | ORAL | Status: DC
  Administered 2023-12-08: 40 mg via ORAL
  Filled 2023-12-07: qty 2

## 2023-12-07 MED ORDER — DOFETILIDE 250 MCG PO CAPS
250.0000 ug | ORAL_CAPSULE | Freq: Two times a day (BID) | ORAL | Status: DC
  Administered 2023-12-07 – 2023-12-08 (×2): 250 ug via ORAL
  Filled 2023-12-07 (×2): qty 1

## 2023-12-07 MED ORDER — BISOPROLOL FUMARATE 5 MG PO TABS
10.0000 mg | ORAL_TABLET | Freq: Once | ORAL | Status: AC
  Administered 2023-12-07: 10 mg via ORAL
  Filled 2023-12-07: qty 2

## 2023-12-07 MED ORDER — ALBUTEROL SULFATE (2.5 MG/3ML) 0.083% IN NEBU: 2.5000 mg | INHALATION_SOLUTION | RESPIRATORY_TRACT | Status: DC | PRN

## 2023-12-07 NOTE — ED Provider Notes (Incomplete)
[place] EMERGENCY DEPARTMENT AT [facility] Provider Note   CSN: [id number] Arrival date & time: 12/07/23  1038     History {Add pertinent medical, surgical, social history, OB history to HPI:1} Chief Complaint  Patient presents with   Shortness of Breath    Holly Benson is a 89 y.o. female.  Patient with history of COPD and heart failure.  She was recently admitted with pneumonia.  He started with a cough and shortness of breath for the last few days.   Shortness of Breath      Home Medications Prior to Admission medications   Medication Sig Start Date End Date Taking? Authorizing Provider  acetaminophen (TYLENOL) 325 MG tablet Take 650 mg by mouth as needed for moderate pain or headache.    [provider]  albuterol (VENTOLIN HFA) 108 (90 Base) MCG/ACT inhaler INHALE TWO PUFFS BY MOUTH EVERY 6 HOURS AS NEEDED FOR WHEEZING OR FOR SHORTNESS OF BREATH 04/20/23   Parrett, Tammy S, NP  amLODipine (NORVASC) 5 MG tablet Take 1 tablet (5 mg total) by mouth daily. 02/17/23 12/03/23  Ronney Asters, NP  apixaban (ELIQUIS) 5 MG TABS tablet Take 1 tablet (5 mg total) by mouth 2 (two) times daily. 07/24/23   Camnitz, Andree Coss, MD  benzonatate (TESSALON) 100 MG capsule Take 1 capsule (100 mg total) by mouth 2 (two) times daily as needed for cough. Patient not taking: Reported on 11/21/2023 11/17/23   Philip Aspen, Limmie Patricia, MD  bisoprolol (ZEBETA) 10 MG tablet TAKE 1 TABLET BY MOUTH AT BEDTIME 09/14/23   Hilty, Lisette Abu, MD  Cholecalciferol (VITAMIN D3 MAXIMUM STRENGTH) 125 MCG (5000 UT) capsule Take 5,000 Units by mouth daily.    [provider]  clonazePAM (KLONOPIN) 0.5 MG tablet Take 0.5 tablets (0.25 mg total) by mouth at bedtime as needed for anxiety. Patient taking differently: Take 0.25 mg by mouth at bedtime as needed (Restless leg). 02/12/23   Dohmeier, Porfirio Mylar, MD  docusate sodium (COLACE) 100 MG capsule Take 300 mg by mouth daily as  needed for moderate constipation.    [provider]  dofetilide (TIKOSYN) 250 MCG capsule TAKE 1 CAPSULE BY MOUTH 2 TIMES A DAY. 07/09/23   Sheilah Pigeon, PA-C  furosemide (LASIX) 20 MG tablet Take 1 tablet by mouth on Monday, Wednesday and Friday. Take 2 tablets by mouth on all other days 11/24/23   Ronney Asters, NP  irbesartan (AVAPRO) 300 MG tablet TAKE 1 TABLET BY MOUTH DAILY 11/18/23   Hilty, Lisette Abu, MD  MAGNESIUM OXIDE PO Take 1 tablet by mouth daily.    [provider]  Misc Natural Products (COLON CLEANSE) CAPS Take 4 capsules by mouth as needed (constipation).    [provider]  Multiple Vitamins-Minerals (PRESERVISION AREDS 2) CAPS Take 1 tablet by mouth 2 (two) times daily. 08/11/22   [provider]  polyethylene glycol (MIRALAX / GLYCOLAX) 17 g packet Take 17 g by mouth daily as needed for moderate constipation.    [provider]  PRESCRIPTION MEDICATION at bedtime as needed (Sometimes). CPAP    [provider]  rOPINIRole (REQUIP) 0.5 MG tablet One in AM and one after 3 Pm and one after dinner 8 Pm Patient taking differently: Take 0.5 mg by mouth 3 (three) times daily. 02/12/23   Dohmeier, Porfirio Mylar, MD  rosuvastatin (CRESTOR) 20 MG tablet Take 1 tablet (20 mg total) by mouth daily. 09/25/23 12/24/23  Chrystie Nose, MD  trimethoprim-polymyxin  b (POLYTRIM) ophthalmic solution Place 1 drop into both eyes every 4 (four) hours. Patient not taking: Reported on 11/21/2023 09/23/23   Deeann Saint, MD      Allergies    Lovenox [enoxaparin sodium] and Metoprolol succinate [metoprolol]    Review of Systems   Review of Systems  Respiratory:  Positive for shortness of breath.     Physical Exam Updated Vital Signs BP (!) 97/44 (BP Location: Right Arm)   Pulse 87   Temp 99.5 F (37.5 C) (Oral)   Resp 18   Ht 5\' 3"  (1.6 m)   Wt 62.8 kg   SpO2 94%   BMI 24.53 kg/m  Physical Exam  ED Results / Procedures / Treatments    Labs (all labs ordered are listed, but only abnormal results are displayed) Labs Reviewed  RESP PANEL BY RT-PCR (RSV, FLU A&B, COVID)  RVPGX2 - Abnormal; Notable for the following components:      Result Value   Influenza A by PCR POSITIVE (*)    All other components within normal limits  BASIC METABOLIC PANEL - Abnormal; Notable for the following components:   Calcium 8.6 (*)    All other components within normal limits  BRAIN NATRIURETIC PEPTIDE - Abnormal; Notable for the following components:   B Natriuretic Peptide 756.6 (*)    All other components within normal limits  CBC - Abnormal; Notable for the following components:   RBC 3.60 (*)    Hemoglobin 11.7 (*)    MCV 101.7 (*)    All other components within normal limits  TROPONIN I (HIGH SENSITIVITY)  TROPONIN I (HIGH SENSITIVITY)    EKG EKG Interpretation Date/Time:  Monday December 07 2023 12:11:51 EST Ventricular Rate:  73 PR Interval:  153 QRS Duration:  99 QT Interval:  431 QTC Calculation: 475 R Axis:   20  Text Interpretation: Sinus rhythm RSR' in V1 or V2, right VCD or RVH No significant change since last tracing Confirmed by Linwood Dibbles [id number]) on 12/07/2023 12:21:19 PM  Radiology DG Chest 2 View Result Date: 12/07/2023 CLINICAL DATA:  Shortness of breath. EXAM: CHEST - 2 VIEW COMPARISON:  11/20/2023. FINDINGS: Redemonstration of nonspecific opacities in the right lung apex, similar to the prior study and favored to represent underlying fibrosis/scarring. There is faint irregular opacity overlying the left mid lung zone (marked with electronic arrow sign), which corresponds to pulmonary opacities on the prior CT scan from 11/20/2023. No significant interval change. Bilateral lung fields are otherwise clear. No acute consolidation or lung collapse. Bilateral costophrenic angles are clear. Stable cardio-mediastinal silhouette. No acute osseous abnormalities. Right proximal humerus metallic plate and screws noted. The  soft tissues are within normal limits. IMPRESSION: 1. No acute cardiopulmonary abnormality. 2. Faint irregular opacity overlying the left mid lung zone corresponds to opacity on the recent chest CT scan. Please refer to chest CT scan report for follow-up recommendations. Electronically Signed   By: Jules Schick M.D.   On: 12/07/2023 13:05    Procedures Procedures  {Document cardiac monitor, telemetry assessment procedure when appropriate:1}  Medications Ordered in ED Medications  ipratropium-albuterol (DUONEB) 0.5-2.5 (3) MG/3ML nebulizer solution 3 mL (3 mLs Nebulization Given 12/07/23 1257)  albuterol (PROVENTIL) (2.5 MG/3ML) 0.083% nebulizer solution 2.5 mg (2.5 mg Nebulization Given 12/07/23 1257)  magnesium sulfate IVPB 2 g 50 mL (2 g Intravenous New Bag/Given 12/07/23 1300)  methylPREDNISolone sodium succinate (SOLU-MEDROL) 125 mg/2 mL injection 125 mg (125 mg Intravenous Given 12/07/23 1257)  ED Course/ Medical Decision Making/ A&P   {   Click here for ABCD2, HEART and other calculatorsREFRESH Note before signing :1}                              Medical Decision Making Amount and/or Complexity of Data Reviewed Labs: ordered. Radiology: ordered.  Risk Prescription drug management. Decision regarding hospitalization.   Patient with COPD exacerbation and influenza A with hypoxia.  She will be admitted to medicine  {Document critical care time when appropriate:1} {Document review of labs and clinical decision tools ie heart score, Chads2Vasc2 etc:1}  {Document your independent review of radiology images, and any outside records:1} {Document your discussion with family members, caretakers, and with consultants:1} {Document social determinants of health affecting pt's care:1} {Document your decision making why or why not admission, treatments were needed:1} Final Clinical Impression(s) / ED Diagnoses Final diagnoses:  COPD exacerbation (HCC)  Influenza A    Rx / DC Orders ED  Discharge Orders     None

## 2023-12-07 NOTE — H&P (Signed)
History and Physical  Sierra Spargo Glad ZOX:[id number] DOB: 07-18-1933 DOA: 12/07/2023  PCP: Philip Aspen, Limmie Patricia, MD   Chief Complaint: Congestion, cough  HPI: Emorie Mcfate is a 89 y.o. female with medical history significant for atrial fibrillation on Eliquis, pulmonary hypertension, restless leg syndrome, hypertension, hyperlipidemia, heart failure with preserved EF, COPD on room air and recent hospital stay for acute exacerbation of COPD in the setting of mild heart failure now being admitted to the hospital with recurrent acute exacerbation of COPD in the setting of influenza A infection.  She was discharged home on 1/21, per the patient and her daughter she did well after discharge home, completed her course of oral azithromycin and steroid taper.  She was doing quite well until yesterday, when she developed malaise, nonproductive cough, subjective fever.  She had some associated joint aches when trying to ambulate.  Denies any chest pain, nausea, diarrhea.  Evaluation in the ER as below notable for influenza A infection.  She was given oxygen therapy, as she was saturating 88% on room air.  Given IV Solu-Medrol, and breathing treatments after which hospitalist was contacted for admission.  Review of Systems: Please see HPI for pertinent positives and negatives. A complete 10 system review of systems are otherwise negative.  Past Medical History:  Diagnosis Date   Arrhythmia    Arthritis    Atrial fibrillation (HCC)    Constipation    chronic   COPD (chronic obstructive pulmonary disease) (HCC)    History of radiation therapy    Right lung- 09/22/23-09/29/23- Dr. Antony Blackbird   Hyperlipidemia    Hypertension    pulmonary   OSA on CPAP    Osteoarthritis    Osteoporosis    Peripheral neuropathy    Pulmonary hypertension (HCC)    RLS (restless legs syndrome)    Sleep apnea    Past Surgical History:  Procedure Laterality Date   ABDOMINAL AORTOGRAM W/LOWER EXTREMITY  N/A 03/21/2020   Procedure: ABDOMINAL AORTOGRAM W/ Bilateral LOWER EXTREMITY Run[place];  Surgeon: Iran Ouch, MD;  Location: [facility];  Service: Cardiovascular;  Laterality: N/A;   ABDOMINAL HYSTERECTOMY Bilateral    arm surgery Right    Broken arm and has a plate in it   CARDIOVERSION N/A 03/12/2023   Procedure: CARDIOVERSION;  Surgeon: Quintella Reichert, MD;  Location: [facility];  Service: Cardiovascular;  Laterality: N/A;   CATARACT EXTRACTION Bilateral    COLONOSCOPY     More than 10 years ago [facility]   HERNIA REPAIR  08/2022   KNEE ARTHROSCOPY Right    PERIPHERAL VASCULAR INTERVENTION Bilateral 03/21/2020   Procedure: PERIPHERAL VASCULAR INTERVENTION;  Surgeon: Iran Ouch, MD;  Location: [facility];  Service: Cardiovascular;  Laterality: Bilateral;  external iliac   TONSILLECTOMY     ventral herniorrhaphy  2023   mesh used   Social History:  reports that she quit smoking about 32 years ago. Her smoking use included cigarettes. She started smoking about 69 years ago. She has a 55.5 pack-year smoking history. She has never used smokeless tobacco. She reports current alcohol use of about 7.0 standard drinks of alcohol per week. She reports that she does not use drugs.  Allergies  Allergen Reactions   Lovenox [Enoxaparin Sodium] Hives and Rash    PT STATES SHE BROKE OUT IN A RASH HEAD TO TOE AND LASTED ABOUT 3 WEEKS    Metoprolol Succinate [Metoprolol] Other (See Comments)  Muscle aches, hand pain, and tingling    Family History  Problem Relation Age of Onset   Suicidality Father    Stroke Maternal Grandfather    Hypertension Sister    Colon cancer Neg Hx    Esophageal cancer Neg Hx    Pancreatic cancer Neg Hx    Stomach cancer Neg Hx    Liver disease Neg Hx      Prior to Admission medications   Medication Sig Start Date End Date Taking? Authorizing Provider  acetaminophen (TYLENOL) 325 MG tablet Take 650 mg by mouth as needed  for moderate pain or headache.    [provider]  albuterol (VENTOLIN HFA) 108 (90 Base) MCG/ACT inhaler INHALE TWO PUFFS BY MOUTH EVERY 6 HOURS AS NEEDED FOR WHEEZING OR FOR SHORTNESS OF BREATH 04/20/23   Parrett, Tammy S, NP  amLODipine (NORVASC) 5 MG tablet Take 1 tablet (5 mg total) by mouth daily. 02/17/23 12/03/23  Ronney Asters, NP  apixaban (ELIQUIS) 5 MG TABS tablet Take 1 tablet (5 mg total) by mouth 2 (two) times daily. 07/24/23   Camnitz, Andree Coss, MD  benzonatate (TESSALON) 100 MG capsule Take 1 capsule (100 mg total) by mouth 2 (two) times daily as needed for cough. Patient not taking: Reported on 11/21/2023 11/17/23   Philip Aspen, Limmie Patricia, MD  bisoprolol (ZEBETA) 10 MG tablet TAKE 1 TABLET BY MOUTH AT BEDTIME 09/14/23   Hilty, Lisette Abu, MD  Cholecalciferol (VITAMIN D3 MAXIMUM STRENGTH) 125 MCG (5000 UT) capsule Take 5,000 Units by mouth daily.    [provider]  clonazePAM (KLONOPIN) 0.5 MG tablet Take 0.5 tablets (0.25 mg total) by mouth at bedtime as needed for anxiety. Patient taking differently: Take 0.25 mg by mouth at bedtime as needed (Restless leg). 02/12/23   Dohmeier, Porfirio Mylar, MD  docusate sodium (COLACE) 100 MG capsule Take 300 mg by mouth daily as needed for moderate constipation.    [provider]  dofetilide (TIKOSYN) 250 MCG capsule TAKE 1 CAPSULE BY MOUTH 2 TIMES A DAY. 07/09/23   Sheilah Pigeon, PA-C  furosemide (LASIX) 20 MG tablet Take 1 tablet by mouth on Monday, Wednesday and Friday. Take 2 tablets by mouth on all other days 11/24/23   Ronney Asters, NP  irbesartan (AVAPRO) 300 MG tablet TAKE 1 TABLET BY MOUTH DAILY 11/18/23   Hilty, Lisette Abu, MD  MAGNESIUM OXIDE PO Take 1 tablet by mouth daily.    [provider]  Misc Natural Products (COLON CLEANSE) CAPS Take 4 capsules by mouth as needed (constipation).    [provider]  Multiple Vitamins-Minerals (PRESERVISION AREDS 2) CAPS Take 1 tablet by mouth 2  (two) times daily. 08/11/22   [provider]  polyethylene glycol (MIRALAX / GLYCOLAX) 17 g packet Take 17 g by mouth daily as needed for moderate constipation.    [provider]  PRESCRIPTION MEDICATION at bedtime as needed (Sometimes). CPAP    [provider]  rOPINIRole (REQUIP) 0.5 MG tablet One in AM and one after 3 Pm and one after dinner 8 Pm Patient taking differently: Take 0.5 mg by mouth 3 (three) times daily. 02/12/23   Dohmeier, Porfirio Mylar, MD  rosuvastatin (CRESTOR) 20 MG tablet Take 1 tablet (20 mg total) by mouth daily. 09/25/23 12/24/23  Chrystie Nose, MD  trimethoprim-polymyxin b (POLYTRIM) ophthalmic solution Place 1 drop into both eyes every 4 (four) hours. Patient not taking: Reported on 11/21/2023 09/23/23   Deeann Saint, MD  Physical Exam: BP (!) 97/44 (BP Location: Right Arm)   Pulse 87   Temp 99.5 F (37.5 C) (Oral)   Resp 18   Ht 5\' 3"  (1.6 m)   Wt 62.8 kg   SpO2 94%   BMI 24.53 kg/m  General:  Alert, oriented, calm, in no acute distress, wearing 2 L nasal cannula oxygen.  Her daughter is at the bedside. Eyes: EOMI, clear conjuctivae, white sclerea Neck: supple, no masses, trachea mildline  Cardiovascular: RRR, no murmurs or rubs, no peripheral edema  Respiratory: Breath sounds are distant with diffuse rhonchi, no tachypnea or respiratory distress.  She has some mild end expiratory wheezing. Abdomen: soft, nontender, nondistended, normal bowel tones heard  Skin: dry, no rashes  Musculoskeletal: no joint effusions, normal range of motion  Psychiatric: appropriate affect, normal speech  Neurologic: extraocular muscles intact, clear speech, moving all extremities with intact sensorium         Labs on Admission:  Basic Metabolic Panel: Recent Labs  Lab 12/07/23 1243  NA 136  K 4.3  CL 102  CO2 24  GLUCOSE 99  BUN 22  CREATININE 0.79  CALCIUM 8.6*   Liver Function Tests: No results for input(s): "AST", "ALT", "ALKPHOS",  "BILITOT", "PROT", "ALBUMIN" in the last 168 hours. No results for input(s): "LIPASE", "AMYLASE" in the last 168 hours. No results for input(s): "AMMONIA" in the last 168 hours. CBC: Recent Labs  Lab 12/07/23 1406  WBC 5.9  HGB 11.7*  HCT 36.6  MCV 101.7*  PLT 166   Cardiac Enzymes: No results for input(s): "CKTOTAL", "CKMB", "CKMBINDEX", "TROPONINI" in the last 168 hours. BNP (last 3 results) Recent Labs    11/20/23 1552 11/22/23 0640 12/07/23 1243  BNP 796.9* 739.6* 756.6*    ProBNP (last 3 results) No results for input(s): "PROBNP" in the last 8760 hours.  CBG: No results for input(s): "GLUCAP" in the last 168 hours.  Radiological Exams on Admission: DG Chest 2 View Result Date: 12/07/2023 CLINICAL DATA:  Shortness of breath. EXAM: CHEST - 2 VIEW COMPARISON:  11/20/2023. FINDINGS: Redemonstration of nonspecific opacities in the right lung apex, similar to the prior study and favored to represent underlying fibrosis/scarring. There is faint irregular opacity overlying the left mid lung zone (marked with electronic arrow sign), which corresponds to pulmonary opacities on the prior CT scan from 11/20/2023. No significant interval change. Bilateral lung fields are otherwise clear. No acute consolidation or lung collapse. Bilateral costophrenic angles are clear. Stable cardio-mediastinal silhouette. No acute osseous abnormalities. Right proximal humerus metallic plate and screws noted. The soft tissues are within normal limits. IMPRESSION: 1. No acute cardiopulmonary abnormality. 2. Faint irregular opacity overlying the left mid lung zone corresponds to opacity on the recent chest CT scan. Please refer to chest CT scan report for follow-up recommendations. Electronically Signed   By: Jules Schick M.D.   On: 12/07/2023 13:05   Assessment/Plan Edward Trevino is a 89 y.o. female with medical history significant for atrial fibrillation on Eliquis, pulmonary hypertension, restless  leg syndrome, hypertension, hyperlipidemia, heart failure with preserved EF, COPD on room air and recent hospital stay for acute exacerbation of COPD in the setting of mild heart failure now being admitted to the hospital with recurrent acute exacerbation of COPD in the setting of influenza A infection.  Acute exacerbation in the setting of influenza A-with increased cough, wheezing, rhonchi.  Hypoxia as below. -Observation admission -Supplemental oxygen to keep O2 saturation greater than 90% -Incentive spirometer and flutter  valve -Given IV Solu-Medrol in the ER, will start prednisone in the morning Currently no indication for antibiotics  Acute hypoxia-likely due to influenza, treated as above  Influenza A-symptoms started 2/2 -Supportive care -Renally dosed Tamiflu x 5 days  Atrial fibrillation-continue home Tikosyn, and Eliquis  Hyperlipidemia-Crestor  RLS-Requip 3 times daily, with Klonopin as needed at bedtime  DVT prophylaxis: On Eliquis    Code Status: Full Code  Consults called: None  Admission status: Observation  Time spent: 48 minutes  Kyeisha Janowicz Sharlette Dense MD [facility] Pager [phone]  If 7PM-7AM, please contact night-coverage [email or web address] Password [email or web address]  12/07/2023, 2:42 PM

## 2023-12-07 NOTE — Plan of Care (Signed)

## 2023-12-07 NOTE — ED Notes (Signed)
Report given to [place], RN

## 2023-12-07 NOTE — ED Triage Notes (Signed)
Patient is here for evaluation of shortness of breath. Reports recently discharged out of the hospital a few weeks ago. Family reports rhonci in lungs now. Patient reports feeling short of breath and spiking a 101.5 fever last night. Family reports that on room air patient's O2 was down to 88-89%, she is normally 95% on room air. Pt on O2 in the ER. Pt has a cough that is sometimes productive.

## 2023-12-07 NOTE — ED Notes (Signed)
ED TO INPATIENT HANDOFF REPORT  ED Nurse Name and Phone #:  Wynema Birch Name/Age/Gender Holly Benson 89 y.o. female Room/Bed: WA14/WA14  Code Status   Code Status: Full Code  Home/SNF/Other Home Patient oriented to: self, place, time, and situation Is this baseline? Yes   Triage Complete: Triage complete  Chief Complaint Influenza A [J10.1]  Triage Note Patient is here for evaluation of shortness of breath. Reports recently discharged out of the hospital a few weeks ago. Family reports rhonci in lungs now. Patient reports feeling short of breath and spiking a 101.5 fever last night. Family reports that on room air patient's O2 was down to 88-89%, she is normally 95% on room air. Pt on O2 in the ER. Pt has a cough that is sometimes productive.   Allergies Allergies  Allergen Reactions   Lovenox [Enoxaparin Sodium] Hives, Rash and Other (See Comments)    PT STATES SHE BROKE OUT IN A RASH HEAD TO TOE AND LASTED ABOUT 3 WEEKS    Metoprolol Succinate [Metoprolol] Other (See Comments)    Muscle aches, hand pain, and tingling    Level of Care/Admitting Diagnosis ED Disposition     ED Disposition  Admit   Condition  --   Comment  Hospital Area: [facility] [[id number]]  Level of Care: Med-Surg [16]  May place patient in observation at [facility] or [facility] if equivalent level of care is available:: Yes  Covid Evaluation: Confirmed COVID Negative  Diagnosis: Influenza A [161096]  Admitting Physician: Maryln Gottron [[id number]]  Attending Physician: Kirby Crigler, MIR [email or web address] [[id number]]          B Medical/Surgery History Past Medical History:  Diagnosis Date   Arrhythmia    Arthritis    Atrial fibrillation (HCC)    Constipation    chronic   COPD (chronic obstructive pulmonary disease) (HCC)    History of radiation therapy    Right lung- 09/22/23-09/29/23- Dr. Antony Blackbird   Hyperlipidemia    Hypertension    pulmonary   OSA on CPAP     Osteoarthritis    Osteoporosis    Peripheral neuropathy    Pulmonary hypertension (HCC)    RLS (restless legs syndrome)    Sleep apnea    Past Surgical History:  Procedure Laterality Date   ABDOMINAL AORTOGRAM W/LOWER EXTREMITY N/A 03/21/2020   Procedure: ABDOMINAL AORTOGRAM W/ Bilateral LOWER EXTREMITY [place];  Surgeon: Iran Ouch, MD;  Location: [facility] INV[facility]IVE [facility];  Service: Cardiovascular;  Laterality: N/A;   ABDOMINAL HYSTERECTOMY Bilateral    arm surgery Right    Broken arm and has a plate in it   CARDIOVERSION N/A 03/12/2023   Procedure: CARDIOVERSION;  Surgeon: Quintella Reichert, MD;  Location: [facility];  Service: Cardiovascular;  Laterality: N/A;   CATARACT EXTRACTION Bilateral    COLONOSCOPY     More than 10 years ago In [facility]   HERNIA REPAIR  08/2022   KNEE ARTHROSCOPY Right    PERIPHERAL VASCULAR INTERVENTION Bilateral 03/21/2020   Procedure: PERIPHERAL VASCULAR INTERVENTION;  Surgeon: Iran Ouch, MD;  Location: [facility];  Service: Cardiovascular;  Laterality: Bilateral;  external iliac   TONSILLECTOMY     ventral herniorrhaphy  2023   mesh used     A IV Location/Drains/Wounds Patient Lines/Drains/Airways Status     Active Line/Drains/Airways     Name Placement date Placement time Site Days   Peripheral IV 12/07/23 18 G Left Antecubital  12/07/23  1250  Antecubital  less than 1            Intake/Output Last 24 hours  Intake/Output Summary (Last 24 hours) at 12/07/2023 1523 Last data filed at 12/07/2023 1523 Gross per 24 hour  Intake 304.89 ml  Output --  Net 304.89 ml    Labs/Imaging Results for orders placed or performed during the hospital encounter of 12/07/23 (from the past 48 hours)  Resp panel by RT-PCR (RSV, Flu A&B, Covid) Anterior Nasal Swab     Status: Abnormal   Collection Time: 12/07/23 12:24 PM   Specimen: Anterior Nasal Swab  Result Value Ref Range   SARS Coronavirus 2 by RT PCR NEGATIVE NEGATIVE     Comment: (NOTE) SARS-CoV-2 target nucleic acids are NOT DETECTED.  The SARS-CoV-2 RNA is generally detectable in upper respiratory specimens during the acute phase of infection. The lowest concentration of SARS-CoV-2 viral copies this assay can detect is 138 copies/mL. A negative result does not preclude SARS-Cov-2 infection and should not be used as the sole basis for treatment or other patient management decisions. A negative result may occur with  improper specimen collection/handling, submission of specimen other than nasopharyngeal swab, presence of viral mutation(s) within the areas targeted by this assay, and inadequate number of viral copies(<138 copies/mL). A negative result must be combined with clinical observations, patient history, and epidemiological information. The expected result is Negative.  Fact Sheet for Patients:  BloggerCourse.com  Fact Sheet for Healthcare Providers:  SeriousBroker.it  This test is no t yet approved or cleared by the [place] FDA and  has been authorized for detection and/or diagnosis of SARS-CoV-2 by FDA under an Emergency Use Authorization (EUA). This EUA will remain  in effect (meaning this test can be used) for the duration of the COVID-19 declaration under Section 564(b)(1) of the Act, 21 U.S.C.section 360bbb-3(b)(1), unless the authorization is terminated  or revoked sooner.       Influenza A by PCR POSITIVE (A) NEGATIVE   Influenza B by PCR NEGATIVE NEGATIVE    Comment: (NOTE) The Xpert Xpress SARS-CoV-2/FLU/RSV plus assay is intended as an aid in the diagnosis of influenza from Nasopharyngeal swab specimens and should not be used as a sole basis for treatment. Nasal washings and aspirates are unacceptable for Xpert Xpress SARS-CoV-2/FLU/RSV testing.  Fact Sheet for Patients: BloggerCourse.com  Fact Sheet for Healthcare  Providers: SeriousBroker.it  This test is not yet approved or cleared by the [place] FDA and has been authorized for detection and/or diagnosis of SARS-CoV-2 by FDA under an Emergency Use Authorization (EUA). This EUA will remain in effect (meaning this test can be used) for the duration of the COVID-19 declaration under Section 564(b)(1) of the Act, 21 U.S.C. section 360bbb-3(b)(1), unless the authorization is terminated or revoked.     Resp Syncytial Virus by PCR NEGATIVE NEGATIVE    Comment: (NOTE) Fact Sheet for Patients: BloggerCourse.com  Fact Sheet for Healthcare Providers: SeriousBroker.it  This test is not yet approved or cleared by the [place] FDA and has been authorized for detection and/or diagnosis of SARS-CoV-2 by FDA under an Emergency Use Authorization (EUA). This EUA will remain in effect (meaning this test can be used) for the duration of the COVID-19 declaration under Section 564(b)(1) of the Act, 21 U.S.C. section 360bbb-3(b)(1), unless the authorization is terminated or revoked.  Performed at [facility], [address] [place], [place] [address]   Basic metabolic panel     Status:  Abnormal   Collection Time: 12/07/23 12:43 PM  Result Value Ref Range   Sodium 136 135 - 145 mmol/L   Potassium 4.3 3.5 - 5.1 mmol/L   Chloride 102 98 - 111 mmol/L   CO2 24 22 - 32 mmol/L   Glucose, Bld 99 70 - 99 mg/dL    Comment: Glucose reference range applies only to samples taken after fasting for at least 8 hours.   BUN 22 8 - 23 mg/dL   Creatinine, Ser 1.61 0.44 - 1.00 mg/dL   Calcium 8.6 (L) 8.9 - 10.3 mg/dL   GFR, Estimated >09 >60 mL/min    Comment: (NOTE) Calculated using the CKD-EPI Creatinine Equation (2021)    Anion gap 10 5 - 15    Comment: Performed at [facility], [address] [place], [place] [address]  Troponin I (High  Sensitivity)     Status: None   Collection Time: 12/07/23 12:43 PM  Result Value Ref Range   Troponin I (High Sensitivity) 15 <18 ng/L    Comment: (NOTE) Elevated high sensitivity troponin I (hsTnI) values and significant  changes across serial measurements may suggest ACS but many other  chronic and acute conditions are known to elevate hsTnI results.  Refer to the "Links" section for chest pain algorithms and additional  guidance. Performed at [facility], [address], [place], [place] [address]   Brain natriuretic peptide     Status: Abnormal   Collection Time: 12/07/23 12:43 PM  Result Value Ref Range   B Natriuretic Peptide 756.6 (H) 0.0 - 100.0 pg/mL    Comment: Performed at [facility], [address], [place], [place] [address]  Troponin I (High Sensitivity)     Status: None   Collection Time: 12/07/23  2:06 PM  Result Value Ref Range   Troponin I (High Sensitivity) 15 <18 ng/L    Comment: (NOTE) Elevated high sensitivity troponin I (hsTnI) values and significant  changes across serial measurements may suggest ACS but many other  chronic and acute conditions are known to elevate hsTnI results.  Refer to the "Links" section for chest pain algorithms and additional  guidance. Performed at [facility], [address], [place], [place] [address]   CBC     Status: Abnormal   Collection Time: 12/07/23  2:06 PM  Result Value Ref Range   WBC 5.9 4.0 - 10.5 K/uL   RBC 3.60 (L) 3.87 - 5.11 MIL/uL   Hemoglobin 11.7 (L) 12.0 - 15.0 g/dL   HCT 08.6 57.8 - 46.9 %   MCV 101.7 (H) 80.0 - 100.0 fL   MCH 32.5 26.0 - 34.0 pg   MCHC 32.0 30.0 - 36.0 g/dL   RDW 62.9 52.8 - 41.3 %   Platelets 166 150 - 400 K/uL   nRBC 0.0 0.0 - 0.2 %    Comment: Performed at [facility], [address] [place], [place] [address]   DG Chest 2 View Result Date: 12/07/2023 CLINICAL DATA:  Shortness of breath. EXAM: CHEST  - 2 VIEW COMPARISON:  11/20/2023. FINDINGS: Redemonstration of nonspecific opacities in the right lung apex, similar to the prior study and favored to represent underlying fibrosis/scarring. There is faint irregular opacity overlying the left mid lung zone (marked with electronic arrow sign), which corresponds to pulmonary opacities on the prior CT scan from 11/20/2023. No significant interval change. Bilateral lung fields are otherwise clear. No acute consolidation or lung collapse. Bilateral costophrenic  angles are clear. Stable cardio-mediastinal silhouette. No acute osseous abnormalities. Right proximal humerus metallic plate and screws noted. The soft tissues are within normal limits. IMPRESSION: 1. No acute cardiopulmonary abnormality. 2. Faint irregular opacity overlying the left mid lung zone corresponds to opacity on the recent chest CT scan. Please refer to chest CT scan report for follow-up recommendations. Electronically Signed   By: Jules Schick M.D.   On: 12/07/2023 13:05    Pending Labs Unresulted Labs (From admission, onward)     Start     Ordered   12/08/23 0500  Basic metabolic panel  Tomorrow morning,   R        12/07/23 1442   12/08/23 0500  CBC  Tomorrow morning,   R        12/07/23 1442            Vitals/Pain Today's Vitals   12/07/23 1042 12/07/23 1127 12/07/23 1409 12/07/23 1445  BP: (!) 116/58  (!) 97/44 103/65  Pulse: 74  87 76  Resp: 20  18 17   Temp: 99.5 F (37.5 C)   98.9 F (37.2 C)  TempSrc: Oral   Oral  SpO2: 91%  94% 91%  Weight:  62.8 kg    Height:  5\' 3"  (1.6 m)    PainSc:  3       Isolation Precautions No active isolations  Medications Medications  predniSONE (DELTASONE) tablet 40 mg (has no administration in time range)  dofetilide (TIKOSYN) capsule 250 mcg (250 mcg Oral Not Given 12/07/23 1453)  rosuvastatin (CRESTOR) tablet 20 mg (20 mg Oral Not Given 12/07/23 1454)  apixaban (ELIQUIS) tablet 5 mg (has no administration in time range)   rOPINIRole (REQUIP) tablet 0.5 mg (has no administration in time range)  clonazepam (KLONOPIN) disintegrating tablet 0.25 mg (has no administration in time range)  acetaminophen (TYLENOL) tablet 650 mg (has no administration in time range)    Or  acetaminophen (TYLENOL) suppository 650 mg (has no administration in time range)  ondansetron (ZOFRAN) tablet 4 mg (has no administration in time range)    Or  ondansetron (ZOFRAN) injection 4 mg (has no administration in time range)  albuterol (PROVENTIL) (2.5 MG/3ML) 0.083% nebulizer solution 2.5 mg (has no administration in time range)  ipratropium-albuterol (DUONEB) 0.5-2.5 (3) MG/3ML nebulizer solution 3 mL (has no administration in time range)  oseltamivir (TAMIFLU) capsule 30 mg (has no administration in time range)  ipratropium-albuterol (DUONEB) 0.5-2.5 (3) MG/3ML nebulizer solution 3 mL (3 mLs Nebulization Given 12/07/23 1257)  albuterol (PROVENTIL) (2.5 MG/3ML) 0.083% nebulizer solution 2.5 mg (2.5 mg Nebulization Given 12/07/23 1257)  magnesium sulfate IVPB 2 g 50 mL (0 g Intravenous Stopped 12/07/23 1400)  methylPREDNISolone sodium succinate (SOLU-MEDROL) 125 mg/2 mL injection 125 mg (125 mg Intravenous Given 12/07/23 1257)  sodium chloride 0.9 % bolus 250 mL (0 mLs Intravenous Stopped 12/07/23 1523)    Mobility walks     Focused Assessments Pulmonary Assessment Handoff:  Lung sounds: Bilateral Breath Sounds: Diminished, Expiratory wheezes O2 Device: Nasal Cannula O2 Flow Rate (L/min): 2 L/min    R Recommendations: See Admitting Provider Note  Report given to:   Additional Notes: Flu A positive

## 2023-12-08 DIAGNOSIS — J101 Influenza due to other identified influenza virus with other respiratory manifestations: Secondary | ICD-10-CM | POA: Diagnosis not present

## 2023-12-08 LAB — CBC
HCT: 36.1 % (ref 36.0–46.0)
Hemoglobin: 11.8 g/dL — ABNORMAL LOW (ref 12.0–15.0)
MCH: 32.9 pg (ref 26.0–34.0)
MCHC: 32.7 g/dL (ref 30.0–36.0)
MCV: 100.6 fL — ABNORMAL HIGH (ref 80.0–100.0)
Platelets: 165 10*3/uL (ref 150–400)
RBC: 3.59 MIL/uL — ABNORMAL LOW (ref 3.87–5.11)
RDW: 14.1 % (ref 11.5–15.5)
WBC: 2.8 10*3/uL — ABNORMAL LOW (ref 4.0–10.5)
nRBC: 0 % (ref 0.0–0.2)

## 2023-12-08 LAB — BASIC METABOLIC PANEL
Anion gap: 10 (ref 5–15)
BUN: 21 mg/dL (ref 8–23)
CO2: 22 mmol/L (ref 22–32)
Calcium: 8.1 mg/dL — ABNORMAL LOW (ref 8.9–10.3)
Chloride: 102 mmol/L (ref 98–111)
Creatinine, Ser: 0.79 mg/dL (ref 0.44–1.00)
GFR, Estimated: >60 mL/min (ref >60)
Glucose, Bld: 148 mg/dL — ABNORMAL HIGH (ref 70–99)
Potassium: 4.1 mmol/L (ref 3.5–5.1)
Sodium: 134 mmol/L — ABNORMAL LOW (ref 135–145)

## 2023-12-08 MED ORDER — OSELTAMIVIR PHOSPHATE 30 MG PO CAPS: 30.0000 mg | ORAL_CAPSULE | Freq: Two times a day (BID) | ORAL | 0 refills | Status: AC

## 2023-12-08 MED ORDER — IPRATROPIUM-ALBUTEROL 0.5-2.5 (3) MG/3ML IN SOLN
3.0000 mL | Freq: Two times a day (BID) | RESPIRATORY_TRACT | Status: DC
  Administered 2023-12-08: 3 mL via RESPIRATORY_TRACT
  Filled 2023-12-08: qty 3

## 2023-12-08 MED ORDER — UMECLIDINIUM BROMIDE 62.5 MCG/ACT IN AEPB
1.0000 | INHALATION_SPRAY | Freq: Every day | RESPIRATORY_TRACT | Status: DC
Start: 2023-12-08 — End: 2023-12-08
  Administered 2023-12-08: 1 via RESPIRATORY_TRACT
  Filled 2023-12-08: qty 7

## 2023-12-08 MED ORDER — AMLODIPINE BESYLATE 5 MG PO TABS: 5.0000 mg | ORAL_TABLET | Freq: Every day | ORAL | 7 refills | Status: DC

## 2023-12-08 MED ORDER — PREDNISONE 20 MG PO TABS: 40.0000 mg | ORAL_TABLET | Freq: Every day | ORAL | 0 refills | Status: DC

## 2023-12-08 MED ORDER — ARFORMOTEROL TARTRATE 15 MCG/2ML IN NEBU
15.0000 ug | INHALATION_SOLUTION | Freq: Two times a day (BID) | RESPIRATORY_TRACT | Status: DC
Start: 2023-12-08 — End: 2023-12-08
  Administered 2023-12-08: 15 ug via RESPIRATORY_TRACT
  Filled 2023-12-08: qty 2

## 2023-12-08 MED ORDER — BISOPROLOL FUMARATE 5 MG PO TABS
10.0000 mg | ORAL_TABLET | Freq: Every day | ORAL | Status: DC
Start: 2023-12-08 — End: 2023-12-08
  Administered 2023-12-08: 10 mg via ORAL
  Filled 2023-12-08: qty 2

## 2023-12-08 NOTE — Progress Notes (Signed)
   12/07/23 1931  Assess: MEWS Score  Pulse Rate (!) 150  SpO2 94 %  Assess: MEWS Score  MEWS Temp 0  MEWS Systolic 1  MEWS Pulse 3  MEWS RR 0  MEWS LOC 0  MEWS Score 4  MEWS Score Color Red  Assess: if the MEWS score is Yellow or Red  Were vital signs accurate and taken at a resting state? No, vital signs rechecked  Does the patient meet 2 or more of the SIRS criteria? No  MEWS guidelines implemented  Yes, red  Treat  MEWS Interventions Considered administering scheduled or prn medications/treatments as ordered  Take Vital Signs  Increase Vital Sign Frequency  Red: Q1hr x2, continue Q4hrs until patient remains green for 12hrs  Escalate  MEWS: Escalate Red: Discuss with charge nurse and notify provider. Consider notifying RRT. If remains red for 2 hours consider need for higher level of care  Notify: Charge Nurse/RN  Name of Charge Nurse/RN Notified Therapist, sports  Provider Notification  Provider Name/Title J. Garner Nash NP  Date Provider Notified 12/07/23  Time Provider Notified 1944  Method of Notification Page (secured chat)  Notification Reason Change in status  Provider response See new orders  Date of Provider Response 12/07/23  Time of Provider Response 2008  Assess: SIRS CRITERIA  SIRS Temperature  0  SIRS Respirations  0  SIRS Pulse 1  SIRS WBC 0  SIRS Score Sum  1

## 2023-12-08 NOTE — Plan of Care (Signed)

## 2023-12-08 NOTE — Discharge Summary (Signed)
 Physician Discharge Summary  Holly Benson FMW:[id number] DOB: 1933-03-17 DOA: 12/07/2023  PCP: [name], [name], MD  Admit date: 12/07/2023 Discharge date: 12/08/2023  Time spent: 20 minutes  Recommendations for Outpatient Follow-up:  Requires Chem-12 CBC 1 week Requires x-ray in 1 month and/or CT scan  Discharge Diagnoses:  MAIN problem for hospitalization   Acute hypoxic respiratory failure and sepsis from flu  Please see below for itemized issues addressed in HOpsital- refer to other progress notes for clarity if needed  Discharge Condition: Improved  Diet recommendation: Heart healthy  Filed Weights   12/07/23 1127  Weight: 62.8 kg    History of present illness:  89 year old white female NSCLC status post XRT A-fib CHADVASC >on Tikosyn /Eliquis  HFpEF EF 65% OSA on CPAP Pulmonary hypertension HTN HLD  Recent hospitalization 1/17 1/21 2025 COPD exacerbation Rx steroids azithromycin  at discharge and also diarrhea slightly during hospital stay  Events 12/07/2023 [facility] evaluation for shortness of breath spiking fevers 101.5 O2 sat down to 88 to 89% does not use oxygen  required oxygen  in ED-associated nonproductive cough joint aches FLU a [+] Sodium 136 potassium 4.3 BUN/creatinine 22/0.9 (baseline 2/0.6) BNP 756 troponin flat WBC 5.9 11.7 platelet 166  procedures 2 view CXR no acute cardiopulmonary abnormality faint left midlung zone opacity additionally  Plan   sepsis secondary to influenza A superimposed acute on COPD Continues on Tamiflu  and would complete 5-day therapy-unclear if any bacterial component to infection but would hold antibiotics at this time Continue prednisone  40 at d/c--would Rx for 5 daysreceived IV steroids in emergency room-would give for 5 days total and reevaluate received magnesium  in the emergency room Will resume stiolto atrial fibrillation CHADVASC >4 Eliquis /Tikosyn  Continues on home bisoprolol  10 daily, Tikosyn  250  twice daily, apixaban  5 twice daily-May need to downward adjust dose of apixaban  based on weight per Cards  NSCLC with XRT in the past Follows with Dr. Shannon of radiation oncology---conpleted  3 fx of 54 CGY on 09/29/23 OP follow up  OSA on CPAP  HFpEF with pulmonary hypertension BNP was up to 700 range at admit--was on lasix  mwf 40 and 20 on other days Resume in the OP at d/c  HTN HLD In the next 24 hours resume ARB irbesartan  300, Crestor  20 already resumed  RLS Can resume Requip  0.5 3 times daily  Discharge Exam: Vitals:   12/08/23 0014 12/08/23 0334  BP: 109/66 (!) 136/59  Pulse: 80 66  Resp: 18 18  Temp: 98.5 F (36.9 C) 98 F (36.7 C)  SpO2: 96% 96%    Subj on day of d/c   Looks well feels well not on oxygen  ambulating in hallway without oxygen  requirement   General Exam on discharge  EOMI NCAT no focal deficit no icterus no pallor no wheeze no rales no rhonchi ROM intact S1-S2 seems to be regular although tachycardic Abdomen soft no rebound No lower extremity edema Power 5/5  Discharge Instructions    Allergies as of 12/08/2023       Reactions   Lovenox [enoxaparin Sodium] Hives, Rash, Other (See Comments)   PT STATES SHE BROKE OUT IN A RASH HEAD TO TOE AND LASTED ABOUT 3 WEEKS    Metoprolol  Succinate [metoprolol ] Other (See Comments)   Muscle aches, hand pain, and tingling        Medication List     TAKE these medications    acetaminophen  325 MG tablet Commonly known as: TYLENOL  Take 325-650 mg by mouth every 6 (six) hours  as needed for mild pain (pain score 1-3), headache or fever.   albuterol  108 (90 Base) MCG/ACT inhaler Commonly known as: VENTOLIN  HFA INHALE TWO PUFFS BY MOUTH EVERY 6 HOURS AS NEEDED FOR WHEEZING OR FOR SHORTNESS OF BREATH What changed: See the new instructions.   amLODipine  5 MG tablet Commonly known as: NORVASC  Take 1 tablet (5 mg total) by mouth daily.   apixaban  5 MG Tabs tablet Commonly known as: Eliquis  Take 1  tablet (5 mg total) by mouth 2 (two) times daily.   benzonatate  100 MG capsule Commonly known as: TESSALON  Take 1 capsule (100 mg total) by mouth 2 (two) times daily as needed for cough.   bisoprolol  10 MG tablet Commonly known as: ZEBETA  TAKE 1 TABLET BY MOUTH AT BEDTIME What changed: when to take this   clonazePAM  0.5 MG tablet Commonly known as: KLONOPIN  Take 0.5 tablets (0.25 mg total) by mouth at bedtime as needed for anxiety. What changed: reasons to take this   Colon Cleanse Caps Take 4 capsules by mouth daily as needed (constipation).   docusate sodium  100 MG capsule Commonly known as: COLACE Take 300 mg by mouth daily as needed for moderate constipation.   dofetilide  250 MCG capsule Commonly known as: TIKOSYN  TAKE 1 CAPSULE BY MOUTH 2 TIMES A DAY.   furosemide  20 MG tablet Commonly known as: LASIX  Take 1 tablet by mouth on Monday, Wednesday and Friday. Take 2 tablets by mouth on all other days What changed:  how much to take how to take this when to take this additional instructions   irbesartan  300 MG tablet Commonly known as: AVAPRO  TAKE 1 TABLET BY MOUTH DAILY   MAGNESIUM  OXIDE PO Take 1 tablet by mouth daily.   Mucinex  DM 30-600 MG Tb12 Take 1-2 tablets by mouth every 12 (twelve) hours as needed (for flu-like symptoms).   oseltamivir  30 MG capsule Commonly known as: TAMIFLU  Take 1 capsule (30 mg total) by mouth 2 (two) times daily for 4 days.   polyethylene glycol 17 g packet Commonly known as: MIRALAX  / GLYCOLAX  Take 17 g by mouth daily as needed for moderate constipation (MIX AS DIRECTED).   predniSONE  20 MG tablet Commonly known as: DELTASONE  Take 2 tablets (40 mg total) by mouth daily with breakfast.   PreserVision AREDS 2 Caps Take 1 capsule by mouth 2 (two) times daily.   rOPINIRole  0.5 MG tablet Commonly known as: REQUIP  One in AM and one after 3 Pm and one after dinner 8 Pm What changed:  how much to take how to take this when to  take this additional instructions   rosuvastatin  20 MG tablet Commonly known as: CRESTOR  Take 1 tablet (20 mg total) by mouth daily.   Stiolto Respimat  2.5-2.5 MCG/ACT Aers Generic drug: Tiotropium Bromide -Olodaterol Inhale 2 puffs into the lungs in the morning.   trimethoprim -polymyxin b  ophthalmic solution Commonly known as: Polytrim  Place 1 drop into both eyes every 4 (four) hours.   Vitamin D3 Maximum Strength 125 MCG (5000 UT) capsule Generic drug: Cholecalciferol Take 5,000 Units by mouth daily.       Allergies  Allergen Reactions   Lovenox [Enoxaparin Sodium] Hives, Rash and Other (See Comments)    PT STATES SHE BROKE OUT IN A RASH HEAD TO TOE AND LASTED ABOUT 3 WEEKS    Metoprolol  Succinate [Metoprolol ] Other (See Comments)    Muscle aches, hand pain, and tingling      The results of significant diagnostics from this hospitalization (including imaging,  microbiology, ancillary and laboratory) are listed below for reference.    Significant Diagnostic Studies: DG Chest 2 View Result Date: 12/07/2023 CLINICAL DATA:  Shortness of breath. EXAM: CHEST - 2 VIEW COMPARISON:  11/20/2023. FINDINGS: Redemonstration of nonspecific opacities in the right lung apex, similar to the prior study and favored to represent underlying fibrosis/scarring. There is faint irregular opacity overlying the left mid lung zone (marked with electronic arrow sign), which corresponds to pulmonary opacities on the prior CT scan from 11/20/2023. No significant interval change. Bilateral lung fields are otherwise clear. No acute consolidation or lung collapse. Bilateral costophrenic angles are clear. Stable cardio-mediastinal silhouette. No acute osseous abnormalities. Right proximal humerus metallic plate and screws noted. The soft tissues are within normal limits. IMPRESSION: 1. No acute cardiopulmonary abnormality. 2. Faint irregular opacity overlying the left mid lung zone corresponds to opacity on the  recent chest CT scan. Please refer to chest CT scan report for follow-up recommendations. Electronically Signed   By: Ree Molt M.D.   On: 12/07/2023 13:05   ECHOCARDIOGRAM COMPLETE Result Date: 11/21/2023    ECHOCARDIOGRAM REPORT   Patient Name:   Holly Benson [facility] Date of Exam: 11/21/2023 Medical Rec #:  [id number]           Height:       62.8 in Accession #:    [id number]          Weight:       146.0 lb Date of Birth:  01/09/1933           BSA:          1.687 m Patient Age:    89 years            BP:           137/70 mmHg Patient Gender: F                   HR:           81 bpm. Exam Location:  Inpatient Procedure: 2D Echo, Color Doppler, Cardiac Doppler and 3D Echo Indications:    CHF- Acute Diastolic  History:        Patient has prior history of Echocardiogram examinations, most                 recent 02/03/2023. COPD, Signs/Symptoms:Shortness of Breath; Risk                 Factors:Pulmonary Hypertension, Hypertension, Dyslipidemia and                 Sleep Apnea.  Sonographer:    Logan Shove RDCS Referring Phys: [id number] SUBRINA SUNDIL IMPRESSIONS  1. Left ventricular ejection fraction, by estimation, is 60 to 65%. The left ventricle has normal function. The left ventricle has no regional wall motion abnormalities. Left ventricular diastolic parameters are indeterminate.  2. Right ventricular systolic function is normal. The right ventricular size is mildly enlarged. There is severely elevated pulmonary artery systolic pressure.  3. Left atrial size was severely dilated.  4. Right atrial size was severely dilated.  5. The mitral valve is normal in structure. Mild mitral valve regurgitation. No evidence of mitral stenosis.  6. Tricuspid valve regurgitation is moderate.  7. The aortic valve is tricuspid. Aortic valve regurgitation is mild. No aortic stenosis is present.  8. The inferior vena cava is dilated in size with >50% respiratory variability, suggesting right atrial pressure of 8 mmHg. FINDINGS  Left  Ventricle: Left ventricular ejection  fraction, by estimation, is 60 to 65%. The left ventricle has normal function. The left ventricle has no regional wall motion abnormalities. The left ventricular internal cavity size was normal in size. There is  no left ventricular hypertrophy. Left ventricular diastolic parameters are indeterminate. Right Ventricle: The right ventricular size is mildly enlarged. Right ventricular systolic function is normal. There is severely elevated pulmonary artery systolic pressure. The tricuspid regurgitant velocity is 3.70 m/s, and with an assumed right atrial  pressure of 8 mmHg, the estimated right ventricular systolic pressure is 62.8 mmHg. Left Atrium: Left atrial size was severely dilated. Right Atrium: Right atrial size was severely dilated. Pericardium: There is no evidence of pericardial effusion. Mitral Valve: The mitral valve is normal in structure. Mild mitral annular calcification. Mild mitral valve regurgitation. No evidence of mitral valve stenosis. Tricuspid Valve: The tricuspid valve is normal in structure. Tricuspid valve regurgitation is moderate . No evidence of tricuspid stenosis. Aortic Valve: The aortic valve is tricuspid. Aortic valve regurgitation is mild. Aortic regurgitation PHT measures 320 msec. No aortic stenosis is present. Aortic valve mean gradient measures 4.0 mmHg. Aortic valve peak gradient measures 7.1 mmHg. Aortic  valve area, by VTI measures 2.07 cm. Pulmonic Valve: The pulmonic valve was normal in structure. Pulmonic valve regurgitation is not visualized. No evidence of pulmonic stenosis. Aorta: The aortic root is normal in size and structure. Venous: The inferior vena cava is dilated in size with greater than 50% respiratory variability, suggesting right atrial pressure of 8 mmHg. IAS/Shunts: No atrial level shunt detected by color flow Doppler.  LEFT VENTRICLE PLAX 2D LVIDd:         4.10 cm   Diastology LVIDs:         2.90 cm   LV e' medial:    5.98 cm/s LV PW:         1.10 cm   LV E/e' medial: 21.4 LV IVS:        1.00 cm LVOT diam:     1.90 cm LV SV:         63 LV SV Index:   37 LVOT Area:     2.84 cm  3D Volume EF:                          3D EF:        64 %                          LV EDV:       92 ml                          LV ESV:       33 ml                          LV SV:        59 ml RIGHT VENTRICLE             IVC RV Basal diam:  3.60 cm     IVC diam: 2.50 cm RV Mid diam:    2.60 cm RV S prime:     11.10 cm/s TAPSE (M-mode): 1.8 cm LEFT ATRIUM              Index        RIGHT ATRIUM  Index LA diam:        4.20 cm  2.49 cm/m   RA Area:     32.30 cm LA Vol (A2C):   102.0 ml 60.46 ml/m  RA Volume:   116.00 ml 68.76 ml/m LA Vol (A4C):   106.0 ml 62.83 ml/m LA Biplane Vol: 110.0 ml 65.21 ml/m  AORTIC VALVE AV Area (Vmax):    2.15 cm AV Area (Vmean):   2.07 cm AV Area (VTI):     2.07 cm AV Vmax:           133.00 cm/s AV Vmean:          93.700 cm/s AV VTI:            0.306 m AV Peak Grad:      7.1 mmHg AV Mean Grad:      4.0 mmHg LVOT Vmax:         101.00 cm/s LVOT Vmean:        68.300 cm/s LVOT VTI:          0.223 m LVOT/AV VTI ratio: 0.73 AI PHT:            320 msec  AORTA Ao Root diam: 3.10 cm Ao Asc diam:  2.80 cm MITRAL VALVE                TRICUSPID VALVE MV Area (PHT): 4.36 cm     TR Peak grad:   54.8 mmHg MV Decel Time: 174 msec     TR Vmax:        370.00 cm/s MV E velocity: 128.00 cm/s                             SHUNTS                             Systemic VTI:  0.22 m                             Systemic Diam: 1.90 cm Redell Shallow MD Electronically signed by Redell Shallow MD Signature Date/Time: 11/21/2023/2:10:06 PM    Final    CT Angio Chest PE W and/or Wo Contrast Result Date: 11/20/2023 CLINICAL DATA:  High probability pulmonary embolism, dyspnea, lung cancer. * Tracking Code: BO * EXAM: CT ANGIOGRAPHY CHEST WITH CONTRAST TECHNIQUE: Multidetector CT imaging of the chest was performed using the standard protocol  during bolus administration of intravenous contrast. Multiplanar CT image reconstructions and MIPs were obtained to evaluate the vascular anatomy. RADIATION DOSE REDUCTION: This exam was performed according to the departmental dose-optimization program which includes automated exposure control, adjustment of the mA and/or kV according to patient size and/or use of iterative reconstruction technique. CONTRAST:  75mL OMNIPAQUE  IOHEXOL  350 MG/ML SOLN COMPARISON:  09/18/2023 FINDINGS: Cardiovascular: Extensive multi-vessel coronary artery calcification. Global cardiac size is enlarged with biatrial enlargement noted. No pericardial effusion. The central pulmonary arteries are of normal caliber. There is no intraluminal filling defect identified to suggest acute pulmonary embolism. Extensive atherosclerotic calcification within the thoracic aorta. No aortic aneurysm. Mediastinum/Nodes: No enlarged mediastinal, hilar, or axillary lymph nodes. Thyroid  gland, trachea, and esophagus demonstrate no significant findings. Lungs/Pleura: 11 mm right apical nodule, noted to be hypermetabolic on 08/13/2023 and compatible with patient's known primary bronchogenic malignancy appears stable. Interval development of surrounding architectural distortion with inter and intra lobular  septal thickening at the right apex may reflect changes of radiation therapy. Interval development of multiple nodules within the left upper lobe measuring up to 13 x 18 mm axial # 62/6, nonspecific, possibly infectious though development of multiple intra pulmonary metastases is not excluded. The latter is considered less likely, however, given its relatively isolated distribution within the left upper lobe only. Superimposed mild emphysema. Small bilateral pleural effusions have developed. No pneumothorax. No central obstructing lesion. Upper Abdomen: No acute abnormality. Musculoskeletal: No acute bone abnormality. No lytic or blastic bone lesion. Review  of the MIP images confirms the above findings. IMPRESSION: 1. No pulmonary embolism. 2. Stable 11 mm right apical nodule, noted to be hypermetabolic on 08/13/2023 and compatible with patient's known primary bronchogenic malignancy. Interval development of surrounding architectural distortion with inter and intra lobular septal thickening at the right apex may reflect changes of radiation therapy. 3. Interval development of multiple nodules within the left upper lobe measuring up to 13 x 18 mm, nonspecific, possibly infectious though development of multiple intra pulmonary metastases is not excluded. The latter is considered less likely, given its relatively isolated distribution within the left upper lobe only. Short-term follow-up imaging at 3 months would be helpful in differentiating these entities 4. Interval development of small bilateral pleural effusions. 5. Extensive multi-vessel coronary artery calcification. 6. Mild edema Aortic Atherosclerosis (ICD10-I70.0) and Emphysema (ICD10-J43.9). Electronically Signed   By: Dorethia Molt M.D.   On: 11/20/2023 21:17   DG Chest Port 1 View Result Date: 11/20/2023 CLINICAL DATA:  Difficulty breathing. EXAM: PORTABLE CHEST 1 VIEW COMPARISON:  01/31/2021. FINDINGS: There are chronic nonspecific opacities throughout bilateral lungs including bilateral lung apices, favored to represent fibrosis/scarring. Bilateral lung fields are otherwise clear. No acute consolidation or lung collapse. Bilateral costophrenic angles are clear. Stable cardio-mediastinal silhouette. No acute osseous abnormalities. Right proximal humerus metallic hardware noted. The soft tissues are within normal limits. IMPRESSION: *No acute cardiopulmonary abnormality. Electronically Signed   By: Ree Molt M.D.   On: 11/20/2023 16:37    Microbiology: Recent Results (from the past 240 hours)  Resp panel by RT-PCR (RSV, Flu A&B, Covid) Anterior Nasal Swab     Status: Abnormal   Collection Time:  12/07/23 12:24 PM   Specimen: Anterior Nasal Swab  Result Value Ref Range Status   SARS Coronavirus 2 by RT PCR NEGATIVE NEGATIVE Final    Comment: (NOTE) SARS-CoV-2 target nucleic acids are NOT DETECTED.  The SARS-CoV-2 RNA is generally detectable in upper respiratory specimens during the acute phase of infection. The lowest concentration of SARS-CoV-2 viral copies this assay can detect is 138 copies/mL. A negative result does not preclude SARS-Cov-2 infection and should not be used as the sole basis for treatment or other patient management decisions. A negative result may occur with  improper specimen collection/handling, submission of specimen other than nasopharyngeal swab, presence of viral mutation(s) within the areas targeted by this assay, and inadequate number of viral copies(<138 copies/mL). A negative result must be combined with clinical observations, patient history, and epidemiological information. The expected result is Negative.  Fact Sheet for Patients:  bloggercourse.com  Fact Sheet for Healthcare Providers:  seriousbroker.it  This test is no t yet approved or cleared by the [place]  FDA and  has been authorized for detection and/or diagnosis of SARS-CoV-2 by FDA under an Emergency Use Authorization (EUA). This EUA will remain  in effect (meaning this test can be used) for the duration of the COVID-19 declaration under Section 564(b)(1)  of the Act, 21 U.S.C.section 360bbb-3(b)(1), unless the authorization is terminated  or revoked sooner.       Influenza A by PCR POSITIVE (A) NEGATIVE Final   Influenza B by PCR NEGATIVE NEGATIVE Final    Comment: (NOTE) The Xpert Xpress SARS-CoV-2/FLU/RSV plus assay is intended as an aid in the diagnosis of influenza from Nasopharyngeal swab specimens and should not be used as a sole basis for treatment. Nasal washings and aspirates are unacceptable for Xpert Xpress  SARS-CoV-2/FLU/RSV testing.  Fact Sheet for Patients: bloggercourse.com  Fact Sheet for Healthcare Providers: seriousbroker.it  This test is not yet approved or cleared by the [place]  FDA and has been authorized for detection and/or diagnosis of SARS-CoV-2 by FDA under an Emergency Use Authorization (EUA). This EUA will remain in effect (meaning this test can be used) for the duration of the COVID-19 declaration under Section 564(b)(1) of the Act, 21 U.S.C. section 360bbb-3(b)(1), unless the authorization is terminated or revoked.     Resp Syncytial Virus by PCR NEGATIVE NEGATIVE Final    Comment: (NOTE) Fact Sheet for Patients: bloggercourse.com  Fact Sheet for Healthcare Providers: seriousbroker.it  This test is not yet approved or cleared by the [place]  FDA and has been authorized for detection and/or diagnosis of SARS-CoV-2 by FDA under an Emergency Use Authorization (EUA). This EUA will remain in effect (meaning this test can be used) for the duration of the COVID-19 declaration under Section 564(b)(1) of the Act, 21 U.S.C. section 360bbb-3(b)(1), unless the authorization is terminated or revoked.  Performed at [facility], [address], [place], [place] [address]      Labs: Basic Metabolic Panel: Recent Labs  Lab 12/07/23 1243 12/08/23 0438  NA 136 134*  K 4.3 4.1  CL 102 102  CO2 24 22  GLUCOSE 99 148*  BUN 22 21  CREATININE 0.79 0.79  CALCIUM  8.6* 8.1*   Liver Function Tests: No results for input(s): AST, ALT, ALKPHOS, BILITOT, PROT, ALBUMIN in the last 168 hours. No results for input(s): LIPASE, AMYLASE in the last 168 hours. No results for input(s): AMMONIA in the last 168 hours. CBC: Recent Labs  Lab 12/07/23 1406 12/08/23 0438  WBC 5.9 2.8*  HGB 11.7* 11.8*  HCT 36.6 36.1  MCV 101.7* 100.6*   PLT 166 165   Cardiac Enzymes: No results for input(s): CKTOTAL, CKMB, CKMBINDEX, TROPONINI in the last 168 hours. BNP: BNP (last 3 results) Recent Labs    11/20/23 1552 11/22/23 0640 12/07/23 1243  BNP 796.9* 739.6* 756.6*    ProBNP (last 3 results) No results for input(s): PROBNP in the last 8760 hours.  CBG: No results for input(s): GLUCAP in the last 168 hours.  Signed:  Colen Grimes MD   [facility] 12/08/2023, 9:38 AM

## 2023-12-08 NOTE — Progress Notes (Signed)
   12/08/23 1201  TOC Brief Assessment  Insurance and Status Reviewed  Patient has primary care physician Yes  Home environment has been reviewed Resides in single family home  Prior level of function: Independent  Prior/Current Home Services No current home services  Social Drivers of Health Review SDOH reviewed no interventions necessary  Readmission risk has been reviewed Yes  Transition of care needs no transition of care needs at this time

## 2023-12-08 NOTE — Progress Notes (Signed)
   12/07/23 1948  Assess: MEWS Score  Temp 98 F (36.7 C)  BP 111/65  MAP (mmHg) 78  Pulse Rate (!) 150  Resp 20  SpO2 95 %  O2 Device Nasal Cannula  Assess: MEWS Score  MEWS Temp 0  MEWS Systolic 0  MEWS Pulse 3  MEWS RR 0  MEWS LOC 0  MEWS Score 3  MEWS Score Color Yellow  Assess: if the MEWS score is Yellow or Red  Were vital signs accurate and taken at a resting state? Yes  Does the patient meet 2 or more of the SIRS criteria? No  MEWS guidelines implemented  Yes, yellow  Treat  MEWS Interventions Considered administering scheduled or prn medications/treatments as ordered  Take Vital Signs  Increase Vital Sign Frequency  Yellow: Q2hr x1, continue Q4hrs until patient remains green for 12hrs  Escalate  MEWS: Escalate Yellow: Discuss with charge nurse and consider notifying provider and/or RRT  Notify: Charge Nurse/RN  Name of Charge Nurse/RN Notified [employer]  Provider Notification  Provider Name/Title J. Blondie NP  Date Provider Notified 12/07/23  Time Provider Notified 2022  Method of Notification Page (secured chat)  Notification Reason Change in status  Provider response See new orders  Date of Provider Response 12/07/23  Time of Provider Response 1944  Assess: SIRS CRITERIA  SIRS Temperature  0  SIRS Respirations  0  SIRS Pulse 1  SIRS WBC 0  SIRS Score Sum  1

## 2023-12-08 NOTE — Progress Notes (Signed)
Pt ambulated around unit, with mask on, on room air and continuous pulse ox read 96% the entire time.  Pt denies feeling light headed or dizzy

## 2023-12-08 NOTE — Plan of Care (Signed)

## 2023-12-08 NOTE — Progress Notes (Signed)
I asked dr Mahala Menghini if it's safe to give Tikosyn with no recent EKG and pt not monitored.  MD stated "just give it"

## 2023-12-09 ENCOUNTER — Other Ambulatory Visit: Payer: Self-pay | Admitting: Cardiology

## 2023-12-09 DIAGNOSIS — I480 Paroxysmal atrial fibrillation: Secondary | ICD-10-CM

## 2023-12-09 NOTE — Telephone Encounter (Signed)
 Eliquis  5mg  refill request received. Patient is 89 years old, weight-62.8kg, Crea-0.79 on 12/08/23, Diagnosis-Afib, and last seen by Lawana Pray on 10/12/23. Dose is appropriate based on dosing criteria. Will send in refill to requested pharmacy.

## 2023-12-23 ENCOUNTER — Telehealth: Payer: Self-pay

## 2023-12-23 NOTE — Telephone Encounter (Signed)
Copied from CRM [id number]. Topic: Appointments - Appointment Scheduling >> Dec 22, 2023  3:51 PM Martinique E wrote: Patient was in the hospital 12/07/2023 and was wanting to schedule a hospital follow-up. On the decision tree is stated if there was not opening within the 14 day time-frame to message admin. Since she called today 2/18, that is already passed the 14-day mark. Relayed to patient that I would send to admin to schedule. Patient also wanted to discuss End of Life decisions at this follow-up. >> Dec 22, 2023  4:38 PM Debby Freiberg wrote: Lmom for pt to callback and please put pt in next avail slot for hosp follow up

## 2023-12-24 ENCOUNTER — Encounter: Payer: Self-pay | Admitting: Nurse Practitioner

## 2023-12-24 ENCOUNTER — Inpatient Hospital Stay: Payer: Medicare Other | Admitting: Internal Medicine

## 2023-12-24 ENCOUNTER — Ambulatory Visit: Payer: Medicare Other | Admitting: Nurse Practitioner

## 2023-12-24 VITALS — BP 118/60 | HR 74 | Temp 97.7°F | Ht 62.0 in | Wt 139.2 lb

## 2023-12-24 DIAGNOSIS — J4489 Other specified chronic obstructive pulmonary disease: Secondary | ICD-10-CM

## 2023-12-24 DIAGNOSIS — R911 Solitary pulmonary nodule: Secondary | ICD-10-CM

## 2023-12-24 DIAGNOSIS — J441 Chronic obstructive pulmonary disease with (acute) exacerbation: Secondary | ICD-10-CM

## 2023-12-24 DIAGNOSIS — J101 Influenza due to other identified influenza virus with other respiratory manifestations: Secondary | ICD-10-CM | POA: Diagnosis not present

## 2023-12-24 DIAGNOSIS — J00 Acute nasopharyngitis [common cold]: Secondary | ICD-10-CM

## 2023-12-24 MED ORDER — FLUTICASONE PROPIONATE 50 MCG/ACT NA SUSP: 2.0000 | Freq: Every day | NASAL | 2 refills | Status: DC

## 2023-12-24 MED ORDER — ALBUTEROL SULFATE (2.5 MG/3ML) 0.083% IN NEBU: 2.5000 mg | INHALATION_SOLUTION | Freq: Four times a day (QID) | RESPIRATORY_TRACT | 5 refills | Status: DC | PRN

## 2023-12-24 NOTE — Assessment & Plan Note (Signed)
Mild obstructive disease. Resolved exacerbation. She still has some residual post viral cough, which seems to be more so upper airway/related to postnasal drainage. Lung exam today without adventitious lung sounds. Start mucociliary clearance and target sinus symptoms. She will call if symptoms fail to improve or worsen. Continue bronchodilator regimen. Action plan in place.  Patient Instructions  Continue Albuterol inhaler 2 puffs or 3 mL neb every 6 hours as needed for shortness of breath or wheezing. Notify if symptoms persist despite rescue inhaler/neb use. Use neb twice a day followed by flutter valve ten times  Continue Stiolto 2 puffs daily   Guaifenesin 600 mg Twice daily for cough/congestion Flonase nasal spray 2 sprays each nostril daily for congestion/nasal drainage  Let me know if the cough doesn't get better or things get worse    Attend CT chest scan end of March 2025 Follow up with radiation/oncology as scheduled    Follow up in 6 weeks after next CT with Dr. Delton Coombes or Philis Nettle. If symptoms do not improve or worsen, please contact office for sooner follow up or seek emergency care.

## 2023-12-24 NOTE — Patient Instructions (Addendum)
Continue Albuterol inhaler 2 puffs or 3 mL neb every 6 hours as needed for shortness of breath or wheezing. Notify if symptoms persist despite rescue inhaler/neb use. Use neb twice a day followed by flutter valve ten times  Continue Stiolto 2 puffs daily   Guaifenesin 600 mg Twice daily for cough/congestion Flonase nasal spray 2 sprays each nostril daily for congestion/nasal drainage  Let me know if the cough doesn't get better or things get worse    Attend CT chest scan end of March 2025 Follow up with radiation/oncology as scheduled    Follow up in 6 weeks after next CT with Dr. Delton Coombes or Philis Nettle. If symptoms do not improve or worsen, please contact office for sooner follow up or seek emergency care.

## 2023-12-24 NOTE — Assessment & Plan Note (Addendum)
New LUL nodularity; likely infectious/inflammatory given timeline of development/symptoms. Will reassess on her follow up CT chest end of March 2025 to ensure resolution.  PET avid right apical nodule empirically radiated. She's completed SBRT. Tolerated well. Nodule stable with post radiation changes. Continue to monitor. Follow up with radiation oncology as scheduled.

## 2023-12-24 NOTE — Progress Notes (Signed)
@Patient  ID: Holly Benson, female    DOB: 03-Nov-1933, 89 y.o.   MRN: [id number]  Chief Complaint  Patient presents with   Follow-up    Hospital F/u-feeling better, occass. Cough-white, thick, sob-from hospital, denies fever/wheezing    Referring provider: Philip Aspen, [place]*  HPI: 89 year old female, former smoker followed for COPD, lung nodule. She is a patient of Dr. Kavin Leech and last seen 10/06/2023 by Allison Quarry NP. Past medical history significant for cardiomyopathy, HTN, PAF on Eliquis and amiodarone, complex sleep apnea, IDA, RLS, HLD.   TEST/EVENTS:  03/15/2021 PFT: FVC 104, FEV1 87, ratio 64, TLC 91, DLCO 54.  Mild obstructive airway disease without reversibility and moderately severe diffusion defect 01/08/2023 CT chest: Right upper lobe nodule 1.2 x 1 cm, overall stable 07/14/2023 CT chest: Right upper lobe nodule, 13 x 11 mm; slow increase in size when compared to prior 08/13/2023 PET scan: 13 mm nodule in right lung apex, mildly progressive, suspicious for primary bronchogenic carcinoma 11/30/2023 CTA chest: extensive CAD/atherosclerosis. No PE. No LAD. Stable 11 mm right apical nodule with post radiation changes. Interval development of multiple nodules in LUL, measuring up to 13x18 mm. Possibly infectious, less likely malignancy but unable to exclude.   07/30/2023: OV with Dr. Delton Coombes.  COPD on Stiolto.  Using albuterol once a day.  Slowly enlarging pulmonary nodule that has been followed with serial imaging and is PET positive. May be a candidate for empiric SBRT if PET scan is reassuring. She is willing to entertain this. Will discuss with radiation oncology.   08/31/2023: OV with Kamica Florance NP for follow-up with her daughter-in-law to discuss recent PET scan results.  The right lung nodule has mildly progressed and continues to be hypermetabolic.  No evidence of disease spread, consistent with stage I primary bronchogenic carcinoma.  Previous discussion was to consider treat with  empiric SBRT.  She would like to move forward with this.  Regarding her breathing, feels like she is at her baseline.  She gets short winded with moderate intensity activities.  She does have a daily cough with clear to white phlegm.  Tends to produce sputum in the morning and then clears up throughout the day.  Has an occasional wheeze.  She does have issues with allergies and has baseline postnasal drainage.  She denies any fevers, chills, night sweats, hemoptysis, anorexia, weight loss.  She uses Stiolto daily.  Takes albuterol twice a day usually.  Has never tried any other maintenance inhalers.  10/06/2023: OV with Dondrell Loudermilk NP for follow up via video visit. Since our last appointment, she completed radiation treatments 11/26 with Dr. Roselind Messier.  She tolerated them well without any significant side effects.  She has a follow-up appointment with him next month and then should have a repeat scan in 3 months.  She has not had any issues with night sweats, hemoptysis, anorexia, weight loss. She did try the Trelegy inhaler after her last visit.  Did not feel like this made a difference in her breathing.  She prefers her Stiolto.  Breathing for the most part has been stable.  She did have a viral respiratory infection a little over a week ago.  She was negative for COVID and flu.  She was treated by her PCP for AECOPD with Augmentin and prednisone taper. Feels back to her baseline.  Cough is minimal with occasional clear to white phlegm.  Has not really noticed any wheezing.  Not having to use her albuterol as much.  Has  not required it in the last couple days.  12/24/2023: Today - follow up Patient presents today for follow up. She was hospitalized in January, 1/17-1/21, for AECOPD. Treated with bronchodilators, empiric azithromycin, steroids. She then contracted the flu earlier this month and was admitted for observation 12/07/2023-12/08/2023. She completed tamiflu and prednisone burst. She is feeling back to normal for  the most part. Still has a little congestion and a productive cough. Sometimes the mucus is thick and harder to get up. She does feel like a lot of it comes from her sinuses. She denies any increased shortness of breath, wheezing, fevers, chills, hemoptysis, facial tenderness, ear pain/pressure, headaches. She is not doing any nasal sprays or mucinex. She was using a flutter valve but hasn't been recently. Taking her Stiolto. Hasn't really needed her rescue.  CT imaging from January showed new nodularity in LUL; likely infectious but unable to rule out metastases. She had stable right nodule with post radiation changes. Has repeat CT chest due end of March 2025.   Allergies  Allergen Reactions   Lovenox [Enoxaparin Sodium] Hives, Rash and Other (See Comments)    PT STATES SHE BROKE OUT IN A RASH HEAD TO TOE AND LASTED ABOUT 3 WEEKS    Metoprolol Succinate [Metoprolol] Other (See Comments)    Muscle aches, hand pain, and tingling    Immunization History  Administered Date(s) Administered   Influenza, High Dose Seasonal PF 08/03/2017, 07/15/2018, 06/28/2019, 08/03/2020   Influenza,inj,Quad PF,6+ Mos 08/02/2018   Influenza-Unspecified 08/27/2021, 08/01/2022, 08/07/2023   PFIZER(Purple Top)SARS-COV-2 Vaccination 12/08/2019, 01/02/2020, 10/02/2020, 03/04/2021, 09/12/2021   PNEUMOCOCCAL CONJUGATE-20 05/28/2023   Pneumococcal Polysaccharide-23 01/27/2020   Td 02/22/2009   Tdap 05/28/2023   Yellow Fever 02/22/2009   Zoster Recombinant(Shingrix) 12/25/2017, 03/04/2021    Past Medical History:  Diagnosis Date   Arrhythmia    Arthritis    Atrial fibrillation (HCC)    Constipation    chronic   COPD (chronic obstructive pulmonary disease) (HCC)    History of radiation therapy    Right lung- 09/22/23-09/29/23- Dr. Antony Blackbird   Hyperlipidemia    Hypertension    pulmonary   OSA on CPAP    Osteoarthritis    Osteoporosis    Peripheral neuropathy    Pulmonary hypertension (HCC)    RLS  (restless legs syndrome)    Sleep apnea     Tobacco History: Social History   Tobacco Use  Smoking Status Former   Current packs/day: 0.00   Average packs/day: 1.5 packs/day for 37.0 years (55.5 ttl pk-yrs)   Types: Cigarettes   Start date: 44   Quit date: 11/04/1991   Years since quitting: 32.1  Smokeless Tobacco Never  Tobacco Comments   Former smoker 03/14/22   Counseling given: Not Answered Tobacco comments: Former smoker 03/14/22   Outpatient Medications Prior to Visit  Medication Sig Dispense Refill   acetaminophen (TYLENOL) 325 MG tablet Take 325-650 mg by mouth every 6 (six) hours as needed for mild pain (pain score 1-3), headache or fever.     albuterol (VENTOLIN HFA) 108 (90 Base) MCG/ACT inhaler INHALE TWO PUFFS BY MOUTH EVERY 6 HOURS AS NEEDED FOR WHEEZING OR FOR SHORTNESS OF BREATH (Patient taking differently: Inhale 2 puffs into the lungs every 6 (six) hours as needed for wheezing or shortness of breath.) 18 g 6   amLODipine (NORVASC) 5 MG tablet Take 1 tablet (5 mg total) by mouth daily. 30 tablet 7   apixaban (ELIQUIS) 5 MG TABS tablet TAKE 1 TABLET  BY MOUTH 2 TIMES A DAY 60 tablet 5   bisoprolol (ZEBETA) 10 MG tablet TAKE 1 TABLET BY MOUTH AT BEDTIME (Patient taking differently: Take 10 mg by mouth daily.) 90 tablet 3   Cholecalciferol (VITAMIN D3 MAXIMUM STRENGTH) 125 MCG (5000 UT) capsule Take 5,000 Units by mouth daily.     clonazePAM (KLONOPIN) 0.5 MG tablet Take 0.5 tablets (0.25 mg total) by mouth at bedtime as needed for anxiety. (Patient taking differently: Take 0.25 mg by mouth at bedtime as needed (for restless legs).) 30 tablet 2   Dextromethorphan-guaiFENesin (MUCINEX DM) 30-600 MG TB12 Take 1-2 tablets by mouth every 12 (twelve) hours as needed (for flu-like symptoms).     docusate sodium (COLACE) 100 MG capsule Take 300 mg by mouth daily as needed for moderate constipation.     dofetilide (TIKOSYN) 250 MCG capsule TAKE 1 CAPSULE BY MOUTH 2 TIMES A DAY.  180 capsule 2   furosemide (LASIX) 20 MG tablet Take 1 tablet by mouth on Monday, Wednesday and Friday. Take 2 tablets by mouth on all other days (Patient taking differently: Take 20-40 mg by mouth See admin instructions. Take 20 mg by mouth in the morning on Sun/Tues/Thurs/Sat and 40 mg on Mon/Wed/Fri) 132 tablet 3   irbesartan (AVAPRO) 300 MG tablet TAKE 1 TABLET BY MOUTH DAILY 90 tablet 1   MAGNESIUM OXIDE PO Take 1 tablet by mouth daily.     Misc Natural Products (COLON CLEANSE) CAPS Take 4 capsules by mouth daily as needed (constipation).     Multiple Vitamins-Minerals (PRESERVISION AREDS 2) CAPS Take 1 capsule by mouth 2 (two) times daily.     polyethylene glycol (MIRALAX / GLYCOLAX) 17 g packet Take 17 g by mouth daily as needed for moderate constipation (MIX AS DIRECTED).     rOPINIRole (REQUIP) 0.5 MG tablet One in AM and one after 3 Pm and one after dinner 8 Pm (Patient taking differently: Take 0.5 mg by mouth 3 (three) times daily.) 90 tablet 5   rosuvastatin (CRESTOR) 20 MG tablet Take 1 tablet (20 mg total) by mouth daily. 90 tablet 3   STIOLTO RESPIMAT 2.5-2.5 MCG/ACT AERS Inhale 2 puffs into the lungs in the morning.     benzonatate (TESSALON) 100 MG capsule Take 1 capsule (100 mg total) by mouth 2 (two) times daily as needed for cough. (Patient not taking: Reported on 11/21/2023) 20 capsule 0   predniSONE (DELTASONE) 20 MG tablet Take 2 tablets (40 mg total) by mouth daily with breakfast. 8 tablet 0   trimethoprim-polymyxin b (POLYTRIM) ophthalmic solution Place 1 drop into both eyes every 4 (four) hours. (Patient not taking: Reported on 12/07/2023) 10 mL 0   Facility-Administered Medications Prior to Visit  Medication Dose Route Frequency Provider Last Rate Last Admin   technetium tetrofosmin (TC-MYOVIEW) injection 31.9 millicurie  31.9 millicurie Intravenous Once PRN Chilton Si, MD         Review of Systems:   Constitutional: No weight loss or gain, night sweats, fevers,  chills, or lassitude. +baseline fatigue  HEENT: No headaches, difficulty swallowing, tooth/dental problems, or sore throat. No sneezing, itching, ear ache. +nasal congestion, post nasal drip CV:  No chest pain, orthopnea, PND, swelling in lower extremities, anasarca, dizziness, palpitations, syncope Resp: +shortness of breath with exertion (baseline); productive cough. No wheeze. No excess mucus or change in color of mucus. No hemoptysis. No chest wall deformity GI:  No heartburn, indigestion, abdominal pain, nausea, vomiting, diarrhea, change in bowel habits, loss of appetite  GU: No dysuria, change in color of urine, urgency or frequency.   Skin: No rash, lesions, ulcerations MSK:  No joint pain or swelling.   Neuro: No dizziness or lightheadedness.  Psych: No depression or anxiety. Mood stable.     Physical Exam:  BP 118/60 (BP Location: Left Arm, Patient Position: Sitting, Cuff Size: Normal)   Pulse 74   Temp 97.7 F (36.5 C) (Oral)   Ht 5\' 2"  (1.575 m)   Wt 139 lb 3.2 oz (63.1 kg)   SpO2 93%   BMI 25.46 kg/m   GEN: Pleasant, interactive, well-appearing; elderly; in no acute distress HEENT:  Normocephalic and atraumatic. PERRLA. Sclera white. Nasal turbinates pink, moist and patent bilaterally. Clear rhinorrhea present. Oropharynx pink and moist, without exudate or edema. No lesions, ulcerations, or postnasal drip.  NECK:  Supple w/ fair ROM. No JVD present. Thyroid symmetrical with no goiter or nodules palpated. Submandibular lymphadenopathy.   CV: RRR, no m/r/g, no peripheral edema. Pulses intact, +2 bilaterally. No cyanosis, pallor or clubbing. PULMONARY:  Unlabored, regular breathing. Diminished bilaterally A&P w/o wheezes/rales/rhonchi. No accessory muscle use.  GI: BS present and normoactive. Soft, non-tender to palpation. No organomegaly or masses detected.  MSK: No erythema, warmth or tenderness. Cap refil <2 sec all extrem. No deformities or joint swelling noted.  Neuro:  A/Ox3. No focal deficits noted.   Skin: Warm, no lesions or rashe Psych: Normal affect and behavior. Judgement and thought content appropriate.     Lab Results:  CBC    Component Value Date/Time   WBC 2.8 (L) 12/08/2023 0438   RBC 3.59 (L) 12/08/2023 0438   HGB 11.8 (L) 12/08/2023 0438   HGB 13.4 01/01/2023 1614   HCT 36.1 12/08/2023 0438   HCT 39.0 01/01/2023 1614   PLT 165 12/08/2023 0438   PLT 306 01/01/2023 1614   MCV 100.6 (H) 12/08/2023 0438   MCV 97 01/01/2023 1614   MCH 32.9 12/08/2023 0438   MCHC 32.7 12/08/2023 0438   RDW 14.1 12/08/2023 0438   RDW 12.4 01/01/2023 1614   LYMPHSABS 2.0 11/20/2023 1552   LYMPHSABS 0.9 07/31/2020 1427   MONOABS 0.9 11/20/2023 1552   EOSABS 0.1 11/20/2023 1552   EOSABS 0.0 07/31/2020 1427   BASOSABS 0.0 11/20/2023 1552   BASOSABS 0.0 07/31/2020 1427    BMET    Component Value Date/Time   NA 134 (L) 12/08/2023 0438   NA 140 09/03/2023 1151   K 4.1 12/08/2023 0438   CL 102 12/08/2023 0438   CO2 22 12/08/2023 0438   GLUCOSE 148 (H) 12/08/2023 0438   BUN 21 12/08/2023 0438   BUN 22 09/03/2023 1151   CREATININE 0.79 12/08/2023 0438   CREATININE 0.81 05/26/2022 1046   CALCIUM 8.1 (L) 12/08/2023 0438   GFRNONAA >60 12/08/2023 0438   GFRAA 59 (L) 08/01/2020 1549    BNP    Component Value Date/Time   BNP 756.6 (H) 12/07/2023 1243     Imaging:  DG Chest 2 View Result Date: 12/07/2023 CLINICAL DATA:  Shortness of breath. EXAM: CHEST - 2 VIEW COMPARISON:  11/20/2023. FINDINGS: Redemonstration of nonspecific opacities in the right lung apex, similar to the prior study and favored to represent underlying fibrosis/scarring. There is faint irregular opacity overlying the left mid lung zone (marked with electronic arrow sign), which corresponds to pulmonary opacities on the prior CT scan from 11/20/2023. No significant interval change. Bilateral lung fields are otherwise clear. No acute consolidation or lung collapse. Bilateral  costophrenic angles  are clear. Stable cardio-mediastinal silhouette. No acute osseous abnormalities. Right proximal humerus metallic plate and screws noted. The soft tissues are within normal limits. IMPRESSION: 1. No acute cardiopulmonary abnormality. 2. Faint irregular opacity overlying the left mid lung zone corresponds to opacity on the recent chest CT scan. Please refer to chest CT scan report for follow-up recommendations. Electronically Signed   By: Jules Schick M.D.   On: 12/07/2023 13:05    Administration History     None          Latest Ref Rng & Units 03/15/2021   11:45 AM  PFT Results  FVC-Pre L 2.36   FVC-Predicted Pre % 104   FVC-Post L 2.44   FVC-Predicted Post % 107   Pre FEV1/FVC % % 62   Post FEV1/FCV % % 64   FEV1-Pre L 1.46   FEV1-Predicted Pre % 87   FEV1-Post L 1.56   DLCO uncorrected ml/min/mmHg 9.83   DLCO UNC% % 54   DLCO corrected ml/min/mmHg 9.83   DLCO COR %Predicted % 54   DLVA Predicted % 77   TLC L 4.50   TLC % Predicted % 91   RV % Predicted % 91     No results found for: "NITRICOXIDE"      Assessment & Plan:   COPD with acute exacerbation (HCC) Mild obstructive disease. Resolved exacerbation. She still has some residual post viral cough, which seems to be more so upper airway/related to postnasal drainage. Lung exam today without adventitious lung sounds. Start mucociliary clearance and target sinus symptoms. She will call if symptoms fail to improve or worsen. Continue bronchodilator regimen. Action plan in place.  Patient Instructions  Continue Albuterol inhaler 2 puffs or 3 mL neb every 6 hours as needed for shortness of breath or wheezing. Notify if symptoms persist despite rescue inhaler/neb use. Use neb twice a day followed by flutter valve ten times  Continue Stiolto 2 puffs daily   Guaifenesin 600 mg Twice daily for cough/congestion Flonase nasal spray 2 sprays each nostril daily for congestion/nasal drainage  Let me know if  the cough doesn't get better or things get worse    Attend CT chest scan end of March 2025 Follow up with radiation/oncology as scheduled    Follow up in 6 weeks after next CT with Dr. Delton Coombes or Philis Nettle. If symptoms do not improve or worsen, please contact office for sooner follow up or seek emergency care.    Influenza A Clinically improving. See above. Add on flonase nasal spray for persistent sinus symptoms and target mucociliary clearance   Lung nodule New LUL nodularity; likely infectious/inflammatory given timeline of development/symptoms. Will reassess on her follow up CT chest end of March 2025 to ensure resolution.  PET avid right apical nodule empirically radiated. She's completed SBRT. Tolerated well. Nodule stable with post radiation changes. Continue to monitor. Follow up with radiation oncology as scheduled.     I spent 35 minutes of dedicated to the care of this patient on the date of this encounter to include pre-visit review of records, face-to-face time with the patient discussing conditions above, post visit ordering of testing, clinical documentation with the electronic health record, making appropriate referrals as documented, and communicating necessary findings to members of the patients care team.  Noemi Chapel, NP 12/24/2023  Pt aware and understands NP's role.

## 2023-12-24 NOTE — Telephone Encounter (Signed)
Pt is schedule on 01/06/24

## 2023-12-24 NOTE — Assessment & Plan Note (Signed)
Clinically improving. See above. Add on flonase nasal spray for persistent sinus symptoms and target mucociliary clearance

## 2023-12-29 ENCOUNTER — Other Ambulatory Visit: Payer: Self-pay | Admitting: Neurology

## 2023-12-29 NOTE — Telephone Encounter (Signed)
 I sent urgent message to [employer]

## 2023-12-29 NOTE — Telephone Encounter (Signed)
 Last seen on 02/12/23 Follow up scheduled on 02/18/24

## 2023-12-30 NOTE — Telephone Encounter (Signed)
 I received a message from Holly Benson with [employer] [facility]   looks like we were holding on RX being signed. I had let Thayer Ohm know. I will get this taken care of today.

## 2024-01-01 ENCOUNTER — Ambulatory Visit (HOSPITAL_COMMUNITY)
Admission: RE | Admit: 2024-01-01 | Discharge: 2024-01-01 | Disposition: A | Discharge status: Home / Self Care | Payer: Medicare Other | Source: Ambulatory Visit | Attending: Internal Medicine | Admitting: Internal Medicine

## 2024-01-01 ENCOUNTER — Ambulatory Visit (HOSPITAL_BASED_OUTPATIENT_CLINIC_OR_DEPARTMENT_OTHER)
Admission: RE | Admit: 2024-01-01 | Discharge: 2024-01-01 | Disposition: A | Discharge status: Home / Self Care | Payer: Medicare Other | Source: Ambulatory Visit | Attending: Internal Medicine | Admitting: Internal Medicine

## 2024-01-01 DIAGNOSIS — Z95828 Presence of other vascular implants and grafts: Secondary | ICD-10-CM | POA: Diagnosis not present

## 2024-01-01 DIAGNOSIS — I739 Peripheral vascular disease, unspecified: Secondary | ICD-10-CM | POA: Diagnosis not present

## 2024-01-03 ENCOUNTER — Encounter: Payer: Self-pay | Admitting: Internal Medicine

## 2024-01-03 LAB — VAS US ABI WITH/WO TBI
Left ABI: 0.88
Right ABI: 0.94

## 2024-01-06 ENCOUNTER — Ambulatory Visit (INDEPENDENT_AMBULATORY_CARE_PROVIDER_SITE_OTHER): Payer: Medicare Other | Admitting: Internal Medicine

## 2024-01-06 ENCOUNTER — Other Ambulatory Visit (HOSPITAL_COMMUNITY): Payer: Self-pay

## 2024-01-06 ENCOUNTER — Encounter: Payer: Self-pay | Admitting: Internal Medicine

## 2024-01-06 VITALS — BP 110/58 | HR 70 | Temp 97.6°F | Wt 141.8 lb

## 2024-01-06 DIAGNOSIS — I429 Cardiomyopathy, unspecified: Secondary | ICD-10-CM

## 2024-01-06 DIAGNOSIS — I10 Essential (primary) hypertension: Secondary | ICD-10-CM | POA: Diagnosis not present

## 2024-01-06 DIAGNOSIS — J101 Influenza due to other identified influenza virus with other respiratory manifestations: Secondary | ICD-10-CM

## 2024-01-06 DIAGNOSIS — J441 Chronic obstructive pulmonary disease with (acute) exacerbation: Secondary | ICD-10-CM | POA: Diagnosis not present

## 2024-01-06 DIAGNOSIS — I739 Peripheral vascular disease, unspecified: Secondary | ICD-10-CM

## 2024-01-06 DIAGNOSIS — Z09 Encounter for follow-up examination after completed treatment for conditions other than malignant neoplasm: Secondary | ICD-10-CM

## 2024-01-06 NOTE — Progress Notes (Signed)
 Established Patient Office Visit     CC/Reason for Visit: Hospital discharge follow-up  HPI: Holly Benson is a 89 y.o. female who is coming in today for the above mentioned reasons. Past Medical History is significant for: Atrial fibrillation, non-small cell lung cancer, COPD, hypertension diastolic CHF OSA and pulmonary hypertension.  She presented to the hospital on February 3 with shortness of breath.  She was found to have acute hypoxic respiratory failure and sepsis from influenza A.  She was treated with Tamiflu, prednisone and discharged home with significant improvement.  Since her discharge she has gained 3 pounds and has noted some increasing lower extremity edema.  She takes Lasix 20 mg 4 days a week and 40 mg the rest of the days.  She has not felt short of breath above her baseline.   Past Medical/Surgical History: Past Medical History:  Diagnosis Date   Arrhythmia    Arthritis    Atrial fibrillation (HCC)    Constipation    chronic   COPD (chronic obstructive pulmonary disease) (HCC)    History of radiation therapy    Right lung- 09/22/23-09/29/23- Dr. Antony Blackbird   Hyperlipidemia    Hypertension    pulmonary   OSA on CPAP    Osteoarthritis    Osteoporosis    Peripheral neuropathy    Pulmonary hypertension (HCC)    RLS (restless legs syndrome)    Sleep apnea     Past Surgical History:  Procedure Laterality Date   ABDOMINAL AORTOGRAM W/LOWER EXTREMITY N/A 03/21/2020   Procedure: ABDOMINAL AORTOGRAM W/ Bilateral LOWER EXTREMITY Runoff;  Surgeon: Iran Ouch, MD;  [facility]: [facility];  Service: Cardiovascular;  Laterality: N/A;   ABDOMINAL HYSTERECTOMY Bilateral    arm surgery Right    Broken arm and has a plate in it   CARDIOVERSION N/A 03/12/2023   Procedure: CARDIOVERSION;  Surgeon: Quintella Reichert, MD;  Location: [facility];  Service: Cardiovascular;  Laterality: N/A;   CATARACT EXTRACTION Bilateral    COLONOSCOPY      More than 10 years ago In [facility]   HERNIA REPAIR  08/2022   KNEE ARTHROSCOPY Right    PERIPHERAL VASCULAR INTERVENTION Bilateral 03/21/2020   Procedure: PERIPHERAL VASCULAR INTERVENTION;  Surgeon: Iran Ouch, MD;  [facility]: [facility];  Service: Cardiovascular;  Laterality: Bilateral;  external iliac   TONSILLECTOMY     ventral herniorrhaphy  2023   mesh used    Social History:  reports that she quit smoking about 32 years ago. Her smoking use included cigarettes. She started smoking about 69 years ago. She has a 55.5 pack-year smoking history. She has never used smokeless tobacco. She reports current alcohol use of about 7.0 standard drinks of alcohol per week. She reports that she does not use drugs.  Allergies: Allergies  Allergen Reactions   Lovenox [Enoxaparin Sodium] Hives, Rash and Other (See Comments)    PT STATES SHE BROKE OUT IN A RASH HEAD TO TOE AND LASTED ABOUT 3 WEEKS    Metoprolol Succinate [Metoprolol] Other (See Comments)    Muscle aches, hand pain, and tingling    Family History:  Family History  Problem Relation Age of Onset   Suicidality Father    Stroke Maternal Grandfather    Hypertension Sister    Colon cancer Neg Hx    Esophageal cancer Neg Hx    Pancreatic cancer Neg Hx    Stomach cancer Neg Hx  Liver disease Neg Hx      Current Outpatient Medications:    acetaminophen (TYLENOL) 325 MG tablet, Take 325-650 mg by mouth every 6 (six) hours as needed for mild pain (pain score 1-3), headache or fever., Disp: , Rfl:    albuterol (PROVENTIL) (2.5 MG/3ML) 0.083% nebulizer solution, Take 3 mLs (2.5 mg total) by nebulization every 6 (six) hours as needed for wheezing or shortness of breath., Disp: 75 mL, Rfl: 5   albuterol (VENTOLIN HFA) 108 (90 Base) MCG/ACT inhaler, INHALE TWO PUFFS BY MOUTH EVERY 6 HOURS AS NEEDED FOR WHEEZING OR FOR SHORTNESS OF BREATH (Patient taking differently: Inhale 2 puffs into the lungs every 6 (six) hours as  needed for wheezing or shortness of breath.), Disp: 18 g, Rfl: 6   amLODipine (NORVASC) 5 MG tablet, Take 1 tablet (5 mg total) by mouth daily., Disp: 30 tablet, Rfl: 7   apixaban (ELIQUIS) 5 MG TABS tablet, TAKE 1 TABLET BY MOUTH 2 TIMES A DAY, Disp: 60 tablet, Rfl: 5   benzonatate (TESSALON) 100 MG capsule, Take 1 capsule (100 mg total) by mouth 2 (two) times daily as needed for cough., Disp: 20 capsule, Rfl: 0   bisoprolol (ZEBETA) 10 MG tablet, TAKE 1 TABLET BY MOUTH AT BEDTIME (Patient taking differently: Take 10 mg by mouth daily.), Disp: 90 tablet, Rfl: 3   Cholecalciferol (VITAMIN D3 MAXIMUM STRENGTH) 125 MCG (5000 UT) capsule, Take 5,000 Units by mouth daily., Disp: , Rfl:    clonazePAM (KLONOPIN) 0.5 MG tablet, Take 0.5 tablets (0.25 mg total) by mouth at bedtime as needed for anxiety. (Patient taking differently: Take 0.25 mg by mouth at bedtime as needed (for restless legs).), Disp: 30 tablet, Rfl: 2   Dextromethorphan-guaiFENesin (MUCINEX DM) 30-600 MG TB12, Take 1-2 tablets by mouth every 12 (twelve) hours as needed (for flu-like symptoms)., Disp: , Rfl:    docusate sodium (COLACE) 100 MG capsule, Take 300 mg by mouth daily as needed for moderate constipation., Disp: , Rfl:    dofetilide (TIKOSYN) 250 MCG capsule, TAKE 1 CAPSULE BY MOUTH 2 TIMES A DAY., Disp: 180 capsule, Rfl: 2   fluticasone (FLONASE) 50 MCG/ACT nasal spray, Place 2 sprays into both nostrils daily., Disp: 18.2 mL, Rfl: 2   furosemide (LASIX) 20 MG tablet, Take 1 tablet by mouth on Monday, Wednesday and Friday. Take 2 tablets by mouth on all other days (Patient taking differently: Take 20-40 mg by mouth See admin instructions. Take 20 mg by mouth in the morning on Sun/Tues/Thurs/Sat and 40 mg on Mon/Wed/Fri), Disp: 132 tablet, Rfl: 3   irbesartan (AVAPRO) 300 MG tablet, TAKE 1 TABLET BY MOUTH DAILY, Disp: 90 tablet, Rfl: 1   MAGNESIUM OXIDE PO, Take 1 tablet by mouth daily., Disp: , Rfl:    Misc Natural Products (COLON  CLEANSE) CAPS, Take 4 capsules by mouth daily as needed (constipation)., Disp: , Rfl:    Multiple Vitamins-Minerals (PRESERVISION AREDS 2) CAPS, Take 1 capsule by mouth 2 (two) times daily., Disp: , Rfl:    polyethylene glycol (MIRALAX / GLYCOLAX) 17 g packet, Take 17 g by mouth daily as needed for moderate constipation (MIX AS DIRECTED)., Disp: , Rfl:    rOPINIRole (REQUIP) 0.5 MG tablet, TAKE 1 TABLET BY MOUTH EVERY MORNING AND TAKE 1 TABLET BY MOUTH AFTER 3PM AND TAKE 1 TABLET AFTER DINNER 8PM, Disp: 90 tablet, Rfl: 5   STIOLTO RESPIMAT 2.5-2.5 MCG/ACT AERS, Inhale 2 puffs into the lungs in the morning., Disp: , Rfl:  rosuvastatin (CRESTOR) 20 MG tablet, Take 1 tablet (20 mg total) by mouth daily., Disp: 90 tablet, Rfl: 3 No current facility-administered medications for this visit.  Facility-Administered Medications Ordered in Other Visits:    technetium tetrofosmin (TC-MYOVIEW) injection 31.9 millicurie, 31.9 millicurie, Intravenous, Once PRN, Chilton Si, MD  Review of Systems:  Negative unless indicated in HPI.   Physical Exam: Vitals:   01/06/24 1057  BP: (!) 110/58  Pulse: 70  Temp: 97.6 F (36.4 C)  TempSrc: Oral  SpO2: 95%  Weight: 141 lb 12.8 oz (64.3 kg)    Body mass index is 25.94 kg/m.   Physical Exam Vitals reviewed.  Constitutional:      Appearance: Normal appearance.  HENT:     Head: Normocephalic and atraumatic.  Eyes:     Conjunctiva/sclera: Conjunctivae normal.  Cardiovascular:     Rate and Rhythm: Normal rate. Rhythm irregular.  Pulmonary:     Effort: Pulmonary effort is normal.     Breath sounds: Normal breath sounds.  Musculoskeletal:        General: Swelling present.  Skin:    General: Skin is warm and dry.  Neurological:     General: No focal deficit present.     Mental Status: She is alert and oriented to person, place, and time.  Psychiatric:        Mood and Affect: Mood normal.        Behavior: Behavior normal.        Thought  Content: Thought content normal.        Judgment: Judgment normal.      Impression and Plan:  Hospital discharge follow-up  Influenza A  COPD with acute exacerbation (HCC)  Essential hypertension  Cardiomyopathy, unspecified type [facility])  [facility] charts reviewed in detail. -She is significantly improved since discharge other than her increasing lower extremity edema.  She has clear lung auscultation.  I have asked her to double up on her Lasix dose for the next week so taking 40 mg daily and then reverting back to original dosing schedule.  She knows to reach out to me for increased shortness of breath.   Time spent:30 minutes reviewing chart, interviewing and examining patient and formulating plan of care.     Chaya Jan, MD  Primary Care at [facility]

## 2024-01-08 ENCOUNTER — Encounter: Payer: Self-pay | Admitting: Radiation Oncology

## 2024-01-08 NOTE — Telephone Encounter (Signed)
 Created in error

## 2024-01-11 ENCOUNTER — Telehealth: Payer: Self-pay | Admitting: General Practice

## 2024-01-11 NOTE — Telephone Encounter (Signed)
 Spoke with pt, she reports she continues to have SOB and it seems to be getting worse. She was recently seen by her medical doctor and her lasix was doubled for 1 week and she did see improvement after that. Currently she has a small amount of swelling in her feet, she is wearuing her support hose. She reports SOB with little exertion. She would like to be seen. Follow up scheduled with Tedd Sias cleaver np next available and patient reports the 25th would be fine. She will call back prior to appointment with concerns.

## 2024-01-11 NOTE — Telephone Encounter (Signed)
 Pt c/o Shortness Of Breath: STAT if SOB developed within the last 24 hours or pt is noticeably SOB on the phone  1. Are you currently SOB (can you hear that pt is SOB on the phone)? No   2. How long have you been experiencing SOB? "Its chronic, its been gradually going on"   3. Are you SOB when sitting or when up moving around? Moving around   4. Are you currently experiencing any other symptoms?  No   She asked for a c/b tomorrow afternoon.

## 2024-01-11 NOTE — Telephone Encounter (Signed)
 Created in error

## 2024-01-12 ENCOUNTER — Encounter: Payer: Self-pay | Admitting: Cardiovascular Disease

## 2024-01-12 ENCOUNTER — Ambulatory Visit: Payer: Medicare Other | Attending: Cardiovascular Disease | Admitting: Cardiovascular Disease

## 2024-01-12 VITALS — BP 112/66 | HR 72 | Ht 62.0 in | Wt 139.2 lb

## 2024-01-12 DIAGNOSIS — I4819 Other persistent atrial fibrillation: Secondary | ICD-10-CM

## 2024-01-12 DIAGNOSIS — I10 Essential (primary) hypertension: Secondary | ICD-10-CM

## 2024-01-12 DIAGNOSIS — I739 Peripheral vascular disease, unspecified: Secondary | ICD-10-CM

## 2024-01-12 DIAGNOSIS — R0989 Other specified symptoms and signs involving the circulatory and respiratory systems: Secondary | ICD-10-CM

## 2024-01-12 DIAGNOSIS — E785 Hyperlipidemia, unspecified: Secondary | ICD-10-CM | POA: Diagnosis not present

## 2024-01-12 NOTE — Patient Instructions (Signed)

## 2024-01-12 NOTE — Progress Notes (Signed)
 Cardiology Office Note   Date:  01/12/2024   ID:  Holly, Benson 03-21-1933, MRN [id number]  PCP:  Philip Aspen, Limmie Patricia, MD  Cardiologist:  Dr. Rennis Golden  No chief complaint on file.     History of Present Illness: Holly Benson is a 89 y.o. female who is here today for follow-up visit regarding peripheral arterial disease.  She has known history of atrial fibrillation on long-term anticoagulation with Eliquis, essential hypertension, hyperlipidemia, anxiety, moderate nonobstructive coronary artery disease and obstructive sleep apnea on CPAP.She is a previous smoker and has no history of diabetes.    She was seen in 2021 for severe bilateral back and leg claudication.  Noninvasive vascular studies showed an ABI of 0.88 on the right and 0.69 on the left.  Duplex showed monophasic waveforms starting in the common femoral artery with no evidence of infrainguinal disease.  Aortoiliac duplex showed severe bilateral external iliac artery stenosis and significant left common iliac artery stenosis. Angiography was done in May 2021 which showed severe bilateral external iliac artery disease worse on the left side.  I performed successful drug-eluting self-expanding stent placement to bilateral external iliac arteries.    I reviewed the results of most recent Doppler studies done last month which showed an ABI of 0.94 on the right and 0.88 on the left.  Duplex showed patent bilateral external iliac artery stents with moderately elevated velocities in the distal left external iliac artery beyond the stented segment.  She is mostly limited by shortness of breath with minimal claudication at this time.  She reports numbness in both feet.  Past Medical History:  Diagnosis Date   Arrhythmia    Arthritis    Atrial fibrillation (HCC)    Constipation    chronic   COPD (chronic obstructive pulmonary disease) (HCC)    History of radiation therapy    Right lung- 09/22/23-09/29/23- Dr.  Antony Blackbird   Hyperlipidemia    Hypertension    pulmonary   OSA on CPAP    Osteoarthritis    Osteoporosis    Peripheral neuropathy    Pulmonary hypertension (HCC)    RLS (restless legs syndrome)    Sleep apnea     Past Surgical History:  Procedure Laterality Date   ABDOMINAL AORTOGRAM W/LOWER EXTREMITY N/A 03/21/2020   Procedure: ABDOMINAL AORTOGRAM W/ Bilateral LOWER EXTREMITY Runoff;  Surgeon: Iran Ouch, MD;  [facility]: [facility];  Service: Cardiovascular;  Laterality: N/A;   ABDOMINAL HYSTERECTOMY Bilateral    arm surgery Right    Broken arm and has a plate in it   CARDIOVERSION N/A 03/12/2023   Procedure: CARDIOVERSION;  Surgeon: Quintella Reichert, MD;  [facility]: [facility];  Service: Cardiovascular;  Laterality: N/A;   CATARACT EXTRACTION Bilateral    COLONOSCOPY     More than 10 years ago In [facility]   HERNIA REPAIR  08/2022   KNEE ARTHROSCOPY Right    PERIPHERAL VASCULAR INTERVENTION Bilateral 03/21/2020   Procedure: PERIPHERAL VASCULAR INTERVENTION;  Surgeon: Iran Ouch, MD;  [facility]: [facility];  Service: Cardiovascular;  Laterality: Bilateral;  external iliac   TONSILLECTOMY     ventral herniorrhaphy  2023   mesh used     Current Outpatient Medications  Medication Sig Dispense Refill   acetaminophen (TYLENOL) 325 MG tablet Take 325-650 mg by mouth every 6 (six) hours as needed for mild pain (pain score 1-3), headache or fever.     albuterol (  PROVENTIL) (2.5 MG/3ML) 0.083% nebulizer solution Take 3 mLs (2.5 mg total) by nebulization every 6 (six) hours as needed for wheezing or shortness of breath. 75 mL 5   albuterol (VENTOLIN HFA) 108 (90 Base) MCG/ACT inhaler INHALE TWO PUFFS BY MOUTH EVERY 6 HOURS AS NEEDED FOR WHEEZING OR FOR SHORTNESS OF BREATH (Patient taking differently: Inhale 2 puffs into the lungs every 6 (six) hours as needed for wheezing or shortness of breath.) 18 g 6   amLODipine (NORVASC) 5 MG tablet Take 1  tablet (5 mg total) by mouth daily. 30 tablet 7   apixaban (ELIQUIS) 5 MG TABS tablet TAKE 1 TABLET BY MOUTH 2 TIMES A DAY 60 tablet 5   bisoprolol (ZEBETA) 10 MG tablet TAKE 1 TABLET BY MOUTH AT BEDTIME (Patient taking differently: Take 10 mg by mouth daily.) 90 tablet 3   Cholecalciferol (VITAMIN D3 MAXIMUM STRENGTH) 125 MCG (5000 UT) capsule Take 5,000 Units by mouth daily.     clonazePAM (KLONOPIN) 0.5 MG tablet Take 0.5 tablets (0.25 mg total) by mouth at bedtime as needed for anxiety. (Patient taking differently: Take 0.25 mg by mouth at bedtime as needed (for restless legs).) 30 tablet 2   docusate sodium (COLACE) 100 MG capsule Take 300 mg by mouth daily as needed for moderate constipation.     dofetilide (TIKOSYN) 250 MCG capsule TAKE 1 CAPSULE BY MOUTH 2 TIMES A DAY. 180 capsule 2   fluticasone (FLONASE) 50 MCG/ACT nasal spray Place 2 sprays into both nostrils daily. 18.2 mL 2   furosemide (LASIX) 20 MG tablet Take 1 tablet by mouth on Monday, Wednesday and Friday. Take 2 tablets by mouth on all other days (Patient taking differently: Take 20-40 mg by mouth See admin instructions. Take 20 mg by mouth in the morning on Sun/Tues/Thurs/Sat and 40 mg on Mon/Wed/Fri) 132 tablet 3   irbesartan (AVAPRO) 300 MG tablet TAKE 1 TABLET BY MOUTH DAILY 90 tablet 1   MAGNESIUM OXIDE PO Take 1 tablet by mouth daily.     Misc Natural Products (COLON CLEANSE) CAPS Take 4 capsules by mouth daily as needed (constipation).     Multiple Vitamins-Minerals (PRESERVISION AREDS 2) CAPS Take 1 capsule by mouth 2 (two) times daily.     polyethylene glycol (MIRALAX / GLYCOLAX) 17 g packet Take 17 g by mouth daily as needed for moderate constipation (MIX AS DIRECTED).     rOPINIRole (REQUIP) 0.5 MG tablet TAKE 1 TABLET BY MOUTH EVERY MORNING AND TAKE 1 TABLET BY MOUTH AFTER 3PM AND TAKE 1 TABLET AFTER DINNER 8PM 90 tablet 5   STIOLTO RESPIMAT 2.5-2.5 MCG/ACT AERS Inhale 2 puffs into the lungs in the morning.      rosuvastatin (CRESTOR) 20 MG tablet Take 1 tablet (20 mg total) by mouth daily. 90 tablet 3   No current facility-administered medications for this visit.   Facility-Administered Medications Ordered in Other Visits  Medication Dose Route Frequency Provider Last Rate Last Admin   technetium tetrofosmin (TC-MYOVIEW) injection 31.9 millicurie  31.9 millicurie Intravenous Once PRN Chilton Si, MD        Allergies:   Lovenox [enoxaparin sodium] and Metoprolol succinate [metoprolol]    Social History:  The patient  reports that she quit smoking about 32 years ago. Her smoking use included cigarettes. She started smoking about 69 years ago. She has a 55.5 pack-year smoking history. She has never used smokeless tobacco. She reports current alcohol use of about 7.0 standard drinks of alcohol per week. She  reports that she does not use drugs.   Family History:  The patient's family history includes Hypertension in her sister; Stroke in her maternal grandfather; Suicidality in her father.    ROS:  Please see the history of present illness.   Otherwise, review of systems are positive for none.   All other systems are reviewed and negative.    PHYSICAL EXAM: VS:  BP 112/66   Pulse 72   Ht 5\' 2"  (1.575 m)   Wt 139 lb 3.2 oz (63.1 kg)   SpO2 95%   BMI 25.46 kg/m  , BMI Body mass index is 25.46 kg/m. GEN: Well nourished, well developed, in no acute distress  HEENT: normal  Neck: no JVD,  or masses.  Right carotid bruit. Cardiac: Irregularly irregular, no murmurs, rubs, or gallops,no edema  Respiratory:  clear to auscultation bilaterally, normal work of breathing GI: soft, nontender, nondistended, + BS MS: no deformity or atrophy  Skin: warm and dry, no rash Neuro:  Strength and sensation are intact Psych: euthymic mood, full affect    EKG:  EKG is not ordered today.   Recent Labs: 11/21/2023: ALT 24 11/24/2023: Magnesium 2.6 12/07/2023: B Natriuretic Peptide 756.6 12/08/2023: BUN 21;  Creatinine, Ser 0.79; Hemoglobin 11.8; Platelets 165; Potassium 4.1; Sodium 134    Lipid Panel    Component Value Date/Time   CHOL 230 (H) 09/03/2023 1151   TRIG 129 09/03/2023 1151   HDL 72 09/03/2023 1151   CHOLHDL 3.2 09/03/2023 1151   CHOLHDL 2.7 05/26/2022 1046   LDLCALC 135 (H) 09/03/2023 1151   LDLCALC 125 (H) 05/26/2022 1046      Wt Readings from Last 3 Encounters:  01/12/24 139 lb 3.2 oz (63.1 kg)  01/06/24 141 lb 12.8 oz (64.3 kg)  12/24/23 139 lb 3.2 oz (63.1 kg)           09/10/2017    9:31 AM  PAD Screen  Previous PAD dx? No  Previous surgical procedure? Yes  Dates of procedures arm 2014  Pain with walking? Yes  Subsides with rest? Yes  Feet/toe relief with dangling? No  Painful, non-healing ulcers? No  Extremities discolored? No      ASSESSMENT AND PLAN:  1.  Peripheral arterial disease: Status post  bilateral external iliac artery stent placement .  Her stents are patent and there is borderline stenosis in the distal left external iliac artery.  Will continue to monitor this as she seems to have very minimal symptoms related to this.  Will repeat studies in 1 year.  2.  Persistent atrial fibrillation: She is maintaining in sinus rhythm with Tikosyn and she is on long-term anticoagulation with Eliquis.  3.  Essential hypertension: Blood pressure is controlled on current medications.  4.  Hyperlipidemia: She had myalgia with atorvastatin but seems to be doing well with rosuvastatin 20 mg daily.  Recommended target LDL of less than 70.  5.  Right carotid bruit: Previous carotid Doppler showed mild nonobstructive disease bilaterally.  6.  Coronary artery disease: Cardiac CTA in November showed a calcium score of 2000.  There was evidence of moderate nonobstructive disease.  Currently with no anginal symptoms.  Continue medical therapy.    Disposition:   FU with me in 12 months  Signed,  Lorine Bears, MD  01/12/2024 11:14 AM    [facility]

## 2024-01-16 ENCOUNTER — Other Ambulatory Visit: Payer: Self-pay | Admitting: Family Medicine

## 2024-01-16 DIAGNOSIS — H1033 Unspecified acute conjunctivitis, bilateral: Secondary | ICD-10-CM

## 2024-01-19 ENCOUNTER — Encounter: Payer: Self-pay | Admitting: Internal Medicine

## 2024-01-19 ENCOUNTER — Other Ambulatory Visit: Payer: Self-pay

## 2024-01-19 ENCOUNTER — Other Ambulatory Visit: Payer: Self-pay | Admitting: Cardiology

## 2024-01-19 ENCOUNTER — Encounter: Payer: Self-pay | Admitting: Radiation Oncology

## 2024-01-19 DIAGNOSIS — I480 Paroxysmal atrial fibrillation: Secondary | ICD-10-CM

## 2024-01-19 MED ORDER — APIXABAN 5 MG PO TABS: 5.0000 mg | ORAL_TABLET | Freq: Two times a day (BID) | ORAL | 1 refills | Status: DC

## 2024-01-19 NOTE — Telephone Encounter (Signed)
 Prescription refill request for Eliquis received. Indication:afib Last office visit:3/25 Scr:0.79  2/25 Age: 89 Weight:63.1  kg  Prescription refilled

## 2024-01-19 NOTE — Telephone Encounter (Signed)
 Prescription refill request for Eliquis received. Indication: AF Last office visit: 01/12/24  Terrilee Files MD Scr: 0.79 on 12/08/23  Epic Age: 89 Weight: 63.1kg  Based on above findings Eliquis 5mg  twice daily is the appropriate dose.  Refill approved.

## 2024-01-20 DIAGNOSIS — I10 Essential (primary) hypertension: Secondary | ICD-10-CM | POA: Diagnosis not present

## 2024-01-20 DIAGNOSIS — G4733 Obstructive sleep apnea (adult) (pediatric): Secondary | ICD-10-CM | POA: Diagnosis not present

## 2024-01-20 DIAGNOSIS — I480 Paroxysmal atrial fibrillation: Secondary | ICD-10-CM | POA: Diagnosis not present

## 2024-01-20 DIAGNOSIS — R0602 Shortness of breath: Secondary | ICD-10-CM | POA: Diagnosis not present

## 2024-01-20 DIAGNOSIS — I428 Other cardiomyopathies: Secondary | ICD-10-CM | POA: Diagnosis not present

## 2024-01-20 DIAGNOSIS — I2510 Atherosclerotic heart disease of native coronary artery without angina pectoris: Secondary | ICD-10-CM | POA: Diagnosis not present

## 2024-01-20 DIAGNOSIS — R0609 Other forms of dyspnea: Secondary | ICD-10-CM | POA: Diagnosis not present

## 2024-01-20 LAB — LIPID PANEL
Chol/HDL Ratio: 2.4 ratio (ref 0.0–4.4)
Cholesterol, Total: 183 mg/dL (ref 100–199)
HDL: 75 mg/dL (ref >39)
LDL Chol Calc (NIH): 94 mg/dL (ref 0–99)
Triglycerides: 78 mg/dL (ref 0–149)
VLDL Cholesterol Cal: 14 mg/dL (ref 5–40)

## 2024-01-20 LAB — HEPATIC FUNCTION PANEL
ALT: 19 IU/L (ref 0–32)
AST: 19 IU/L (ref 0–40)
Albumin: 4.1 g/dL (ref 3.7–4.7)
Alkaline Phosphatase: 130 IU/L — ABNORMAL HIGH (ref 44–121)
Bilirubin Total: 0.7 mg/dL (ref 0.0–1.2)
Bilirubin, Direct: 0.23 mg/dL (ref 0.00–0.40)
Total Protein: 6.5 g/dL (ref 6.0–8.5)

## 2024-01-21 ENCOUNTER — Other Ambulatory Visit: Payer: Self-pay

## 2024-01-21 DIAGNOSIS — E785 Hyperlipidemia, unspecified: Secondary | ICD-10-CM

## 2024-01-21 MED ORDER — ALBUTEROL SULFATE (2.5 MG/3ML) 0.083% IN NEBU: 2.5000 mg | INHALATION_SOLUTION | Freq: Four times a day (QID) | RESPIRATORY_TRACT | 5 refills | Status: DC | PRN

## 2024-01-21 NOTE — Telephone Encounter (Signed)
 Good morning, [facility]  The original script went to [facility].  I sent a message to the patient letting her know that we are working on the prescription.

## 2024-01-21 NOTE — Addendum Note (Signed)
 Addended by: Delrae Rend on: 01/21/2024 09:06 AM   Modules accepted: Orders

## 2024-01-25 ENCOUNTER — Telehealth: Payer: Self-pay | Admitting: *Deleted

## 2024-01-25 NOTE — Progress Notes (Unsigned)
 Cardiology Clinic Note   Patient Name: Holly Benson Date of Encounter: 01/26/2024  Primary Care Provider:  Philip Aspen, Limmie Patricia, MD Primary Cardiologist:  Chrystie Nose, MD  Patient Profile    Holly Benson 89 year old female presents the clinic today for follow-up evaluation of her essential hypertension, atrial fibrillation, and shortness of breath.  Past Medical History    Past Medical History:  Diagnosis Date   Arrhythmia    Arthritis    Atrial fibrillation (HCC)    Constipation    chronic   COPD (chronic obstructive pulmonary disease) (HCC)    History of radiation therapy    Right lung- 09/22/23-09/29/23- Dr. Antony Blackbird   Hyperlipidemia    Hypertension    pulmonary   OSA on CPAP    Osteoarthritis    Osteoporosis    Peripheral neuropathy    Pulmonary hypertension (HCC)    RLS (restless legs syndrome)    Sleep apnea    Past Surgical History:  Procedure Laterality Date   ABDOMINAL AORTOGRAM W/LOWER EXTREMITY N/A 03/21/2020   Procedure: ABDOMINAL AORTOGRAM W/ Bilateral LOWER EXTREMITY [place];  Surgeon: Iran Ouch, MD;  Location: [facility];  Service: Cardiovascular;  Laterality: N/A;   ABDOMINAL HYSTERECTOMY Bilateral    arm surgery Right    Broken arm and has a plate in it   CARDIOVERSION N/A 03/12/2023   Procedure: CARDIOVERSION;  Surgeon: Quintella Reichert, MD;  Location: [facility];  Service: Cardiovascular;  Laterality: N/A;   CATARACT EXTRACTION Bilateral    COLONOSCOPY     More than 10 years ago In [facility]   HERNIA REPAIR  08/2022   KNEE ARTHROSCOPY Right    PERIPHERAL VASCULAR INTERVENTION Bilateral 03/21/2020   Procedure: PERIPHERAL VASCULAR INTERVENTION;  Surgeon: Iran Ouch, MD;  Location: [facility];  Service: Cardiovascular;  Laterality: Bilateral;  external iliac   TONSILLECTOMY     ventral herniorrhaphy  2023   mesh used    Allergies  Allergies  Allergen Reactions   Lovenox  [Enoxaparin Sodium] Hives, Rash and Other (See Comments)    PT STATES SHE BROKE OUT IN A RASH HEAD TO TOE AND LASTED ABOUT 3 WEEKS    Metoprolol Succinate [Metoprolol] Other (See Comments)    Muscle aches, hand pain, and tingling    History of Present Illness      Holly Benson has a PMH of hypertension, atrial flutter, hyperlipidemia, obstructive sleep apnea, COPD, peripheral arterial disease, and atrial fibrillation.  CHA2DS2-VASc score 5 on Eliquis.   She was  seen by Alphonzo Severance, PA-C on 05/17/2021.  Of note she was previously on amiodarone which was discontinued due to lung toxicity.  When she was seen in the atrial fibrillation clinic she described a frequent fluttering type sensation which would last for 10 to 15 minutes and happen multiple times per day.  She was started on diltiazem 30 mg daily.     She contacted the nurse triage line 07/11/2021.  Her metoprolol was transitioned to carvedilol.  With metoprolol she did notice tingling and numbness with an icy type pain in her thumb and first 2 fingers on each hand.   She presented to the clinic 07/15/2021 for follow-up evaluation stated she was out of town for several days and noticed that her blood pressure was low.  She reported that she stopped taking her afternoon dose of hydralazine and did not notice much change with her blood pressure.  She discontinued  her morning dose of hydralazine as well.  She noticed a pain in her hands and tingling with metoprolol.  She also noticed some occasional ankle edema.  I changed her metoprolol to carvedilol, gave her the [place] support stocking sheet, discontinued her hydralazine, gave her the salty 6 diet sheet, asked her to maintain her physical activity, and planned follow-up in 1 to 2 months.  Have asked her to maintain a blood pressure log.   She reported that she contacted the office earlier last week for follow-up appointment.  She was told that she would not be scheduled for an appointment  with Dr. Rennis Golden for several months.  She contacted the patient advocate representatives with her complaint.  She was added onto my schedule today.   She followed up with [name] PA-C on 08/06/2021.  She had contacted the office with reports of increased palpitations.  She questioned if she was having more episodes of atrial fibrillation.  She presented to the clinic alone.  She reported that she did not want to distinguish between PACs and paroxysmal atrial fibrillation.  She noted palpitations at least once daily that would last for around 30 minutes.  She denied dizziness, presyncope, syncope with the episodes.  She had not taken her as needed Cardizem.  She was maintaining a blood pressure log.  Her systolic blood pressures were in the 80- 100s.  She denied bleeding issues.  She was tolerating carvedilol well.   She presented to the clinic 10/08/2021 for follow-up evaluation stated over the last week she had noticed increased shortness of breath and had has been taking her diltiazem more regularly due to increased heart rate.  Her blood pressures had been slightly elevated.  In  clinic her heart rate was 88 with a blood pressure of 116/60.  She reported that she  had some increased phlegm and has just returned from seeing her family.  She had a call from her pharmacist who lowered her apixaban dosing due to her weight.  She had regained weight.  I  increased her apixaban dosing back to 5 mg twice daily.  I will ordered an echocardiogram, CBC, and scheduled follow-up for February.   She was seen by Otilio Saber, PA-C 10/10/2021.  During that time she reported that she was doing okay overall.  She noted shortness of breath and fatigue x10 days.  It was felt that it was likely related to her atrial fibrillation.  Her heart rates at home were 80-1 100s.  Her weight was slightly increased and she noted more ankle swelling.  She was prescribed furosemide 20 mg as needed for lower extremity swelling.   She was  seen by Dr. Okey Regal 10/22/2021.  At that time she was interested in starting Tikosyn in February because she was planning a trip to [place] for the month of January.  Her weight remains stable and her heart rate was rate controlled.   She presented to the clinic 12/12/21 for follow-up evaluation and states she had a nice trip to [facility].  She reported that someone told her to stop taking her diltiazem.  She had not taken the medication in around 1 and half months.  She reported that her heart rate had been 90s and low 100s.  She felt that her shortness of breath was stable.  We reviewed the medication and recommendations for starting Tikosyn.  I have asked her to follow-up with the atrial fibrillation clinic, I prescribed 60 mg of extended release diltiazem, and plan  follow-up for 3 to 4 months.  She was noted to have changes on CT scan noted in her lungs.  Her amiodarone was stopped.  She underwent load of dofetilide.  She followed up with Dr. Elberta Fortis 12/01/2022.  She denied chest pain and shortness of breath.  She denied dizziness presyncope and syncope.  She was tolerating medications well.  She denied further episodes of atrial fibrillation.  She underwent repair of ventral hernia and reported that her appetite was getting better.  Chest Vascor was noted to be 4.  She was continued on apixaban, metoprolol and follow-up was planned for 6 months.  She presented to the clinic 01/01/2023 for follow-up evaluation and stated she had been noticing weakness, muscle aches, and shortness of breath over the last 6 weeks.  Her blood pressures had been elevated in the morning and stabilized throughout the day.  We reviewed her previous echocardiogram and last clinic visit.  She expressed understanding.  She felt that she had recovered fairly well since having her hernia repair.  However, she had not been able to return to her baseline physical activity since her surgery.  She had been eating better and her weight was  slightly increased.  I  ordered a CBC and echocardiogram.  Will plan follow-up in 1 month - 2 months.  She planned to travel to [facility] to see a glass blowing exhibit in the near future.  Echocardiogram 02/03/2023 showed normal EF and intermediate diastolic parameters.  CBC was stable.  She presented to the clinic 02/17/2023 for follow-up evaluation and stated her blood pressure had been better controlled.  She still had some weakness and fatigue.  She felt this may be related to her beta-blocker.  We discussed her previous medications and atrial fibrillation.  I explained that  EP would be able to offer recommendations during her appointment in 2 days.  She expressed understanding.  She enjoyed her trip to [place].  She had been somewhat physically active and continued to do yoga.  I refilled her amlodipine and planned follow-up in 6 months.   She was seen in follow-up by Otilio Saber 02/19/2023.  Her atenolol was discontinued and she was started on bisoprolol.  DCCV was also discussed.  She was seen for follow-up EKG on 03/05/2023.  Her symptoms continued.  Her EKG showed atrial flutter 94 bpm.  She was scheduled for DCCV on 03/12/2023.  On presentation for DCCV she was in sinus rhythm.  Her cardioversion was canceled.    She presented to the clinic 04/13/23 for follow-up evaluation and stated she continued to have fatigue, shortness of breath, and decreased endurance.  She was able to climb the stairs to her upstairs apartment and do all of her normal daily activities.  She reported that over the last 6 months she was not able to do elective walking and needed more frequent breaks.  Her EKG showed normal sinus rhythm incomplete right bundle branch block 60 bpm.  We again reviewed her most recent echocardiogram and her visit with EP.  She was tolerating bisoprolol well.  She was noted to have elevated BNP at 891.4 on 03/24/2023.  She did not have much increased urination with increased furosemide.  I increased  her furosemide to 40 mg 4 days/week and have her continue 20 mg 3 days/week.  I  ordered BMP in 2 weeks, gave [place] support stocking sheet, had her continue low-sodium diet, and recommended that she see pulmonology in follow-up for pulmonary function test.  I planned  cardiology follow-up in 3 months.      She was seen in follow-up by Dr. Rennis Golden on 09/03/2023.  During that time she reported some worsening fatigue with exertion as well as dyspnea.  She noted that she had been told she had a spot on her lung that was felt to be lung cancer.  She had follow-up planned with Dr. Roselind Messier.  Pulmonology did not feel that her lungs were contributing to her fatigue.  She denied intermittent atrial fibrillation.  Her EKG at that time did not show atrial fibrillation.  It was felt that her coronary disease may be contributing to her progressive dyspnea on exertion.  Coronary CTA was ordered.  FFR analysis showed no obstructive CAD.  She presented to the clinic 10/12/23 for follow-up evaluation and stated she had no change in her breathing.  She reported that she had an upper respiratory infection and radiation treatments.  These were hard on her.  She felt that she was recovering.  She continued to note difficulties with her breathing that she felt were related to her COPD.  She had been monitoring her watch for atrial fibrillation.  She reported that during her recent illness she did have a few episodes of brief periods of atrial fibrillation that were less than a minute and resolved without intervention.  We reviewed her coronary CT and FFR.  She expressed understanding.  She was tolerating her rosuvastatin well.  I planned for repeat fasting lipids and LFTs in March and follow-up in 6 months.  She was seen in follow-up by Dr. Kary Kos.  During that time it was noted that her bilateral external iliac stents were patent with borderline stenosis in the distal left external iliac artery.  It was felt that she could continue  her therapy and plan for repeat evaluation in 1 year was discussed.  She was maintaining sinus rhythm.  She presents to the clinic today for follow-up evaluation and states she is having a good day.  Her EKG shows sinus bradycardia 58 bpm.  She notes that when she was not taking statin medication she continued to have aches and low energy.  She reports that she started back her rosuvastatin a couple days ago.  We reviewed her most recent lipid panel.  She expressed understanding.  We reviewed her February CBC.  She was noted to be slightly anemic.  I encouraged her to increase the iron in her diet.  We reviewed the importance of combining this with vitamin C to help with absorption.  She expressed understanding.  We reviewed her coronary CTA and echocardiograms.  She remains physically active going to the gym 3 days/week and is very active at home.  I will have her maintain her physical activity, continue her current medication regimen and plan follow-up in 4 to 6 months.    Today she denies chest pain,  melena, hematuria, hemoptysis, diaphoresis, weakness, presyncope, syncope, orthopnea, and PND.     Home Medications    Prior to Admission medications   Medication Sig Start Date End Date Taking? Authorizing Provider  acetaminophen (TYLENOL) 325 MG tablet Take 650 mg by mouth as needed for moderate pain or headache.    [provider]  albuterol (VENTOLIN HFA) 108 (90 Base) MCG/ACT inhaler Inhale 2 puffs into the lungs every 6 (six) hours as needed for wheezing or shortness of breath. 03/10/22   Parrett, Virgel Bouquet, NP  amLODipine (NORVASC) 5 MG tablet Take 1 tablet (5 mg total) by mouth daily. 12/01/22  Camnitz, Will Daphine Deutscher, MD  apixaban (ELIQUIS) 5 MG TABS tablet Take 1 tablet (5 mg total) by mouth 2 (two) times daily. 12/10/22   Camnitz, Andree Coss, MD  atenolol (TENORMIN) 50 MG tablet Take 1 tablet (50 mg total) by mouth daily. 12/02/22   Hilty, Lisette Abu, MD  clonazePAM (KLONOPIN) 0.5 MG  tablet Take 0.25 mg by mouth at bedtime as needed for anxiety.    [provider]  docusate sodium (COLACE) 100 MG capsule Take 300 mg by mouth at bedtime.    [provider]  dofetilide (TIKOSYN) 250 MCG capsule Take 1 capsule (250 mcg total) by mouth 2 (two) times daily. 08/14/22   Sheilah Pigeon, PA-C  furosemide (LASIX) 20 MG tablet Take one tablet by mouth on Monday, Wednesday and Friday 02/28/22   Fenton, Clint R, PA  irbesartan (AVAPRO) 300 MG tablet Take 1 tablet (300 mg total) by mouth daily. 11/12/22   Hilty, Lisette Abu, MD  Misc Natural Products (COLON CLEANSE) CAPS Take 4 capsules by mouth as needed (constipation).    [provider]  OVER THE COUNTER MEDICATION Take 3 capsules by mouth daily. [place], Bone Strength    [provider]  Polyethyl Glycol-Propyl Glycol (SYSTANE) 0.4-0.3 % SOLN Place 1 drop into both eyes as needed.    [provider]  polyethylene glycol (MIRALAX / GLYCOLAX) 17 g packet Take 17 g by mouth 2 (two) times daily.    [provider]  PRESCRIPTION MEDICATION at bedtime. CPAP    [provider]  rOPINIRole (REQUIP) 0.25 MG tablet Take 1 tablet in the morning and three tablets after dinner po 02/13/22   Dohmeier, Porfirio Mylar, MD  Tiotropium Bromide-Olodaterol (STIOLTO RESPIMAT) 2.5-2.5 MCG/ACT AERS USE 2 INHALATIONS ORALLY   EVERY MORNING 05/30/22   Byrum, Les Pou, MD  Vitamin D-Vitamin K (VITAMIN K2-VITAMIN D3 PO) Take 1 Capful by mouth daily.    [provider]    Family History    Family History  Problem Relation Age of Onset   Suicidality Father    Stroke Maternal Grandfather    Hypertension Sister    Colon cancer Neg Hx    Esophageal cancer Neg Hx    Pancreatic cancer Neg Hx    Stomach cancer Neg Hx    Liver disease Neg Hx    She indicated that her mother is deceased. She indicated that her father is deceased. She indicated that her sister is alive. She indicated that her maternal  grandfather is deceased. She indicated that the status of her neg hx is unknown.  Social History    Social History   Socioeconomic History   Marital status: Widowed    Spouse name: Not on file   Number of children: 3   Years of education: Not on file   Highest education level: Master's degree (e.g., MA, MS, MEng, MEd, MSW, MBA)  Occupational History   Occupation: retired    Comment: retired Comptroller  Tobacco Use   Smoking status: Former    Current packs/day: 0.00    Average packs/day: 1.5 packs/day for 37.0 years (55.5 ttl pk-yrs)    Types: Cigarettes    Start date: 19    Quit date: 11/04/1991    Years since quitting: 32.2   Smokeless tobacco: Never   Tobacco comments:    Former smoker 03/14/22  Vaping Use   Vaping status: Never Used  Substance and Sexual Activity   Alcohol use: Yes    Alcohol/week: 7.0 standard  drinks of alcohol    Types: 7 Glasses of wine per week    Comment: 1 glass of wine nightly 03/14/22   Drug use: No   Sexual activity: Not Currently    Birth control/protection: Surgical    Comment: Hyst, First IC >16y/o, Partners <5, No hx of STIs, DES-neg  Other Topics Concern   Not on file  Social History Narrative   Lives with daughter   Social Drivers of Health   Financial Resource Strain: Low Risk  (11/16/2023)   Overall Financial Resource Strain (CARDIA)    Difficulty of Paying Living Expenses: Not hard at all  Food Insecurity: No Food Insecurity (12/07/2023)   Hunger Vital Sign    Worried About Running Out of Food in the Last Year: Never true    Ran Out of Food in the Last Year: Never true  Transportation Needs: No Transportation Needs (12/07/2023)   PRAPARE - Administrator, Civil Service (Medical): No    Lack of Transportation (Non-Medical): No  Physical Activity: Insufficiently Active (11/16/2023)   Exercise Vital Sign    Days of Exercise per Week: 4 days    Minutes of Exercise per Session: 30 min  Stress: No Stress Concern Present  (11/16/2023)   [employer] of Occupational Health - Occupational Stress Questionnaire    Feeling of Stress : Not at all  Social Connections: Moderately Integrated (12/07/2023)   Social Connection and Isolation Panel [NHANES]    Frequency of Communication with Friends and Family: Three times a week    Frequency of Social Gatherings with Friends and Family: Three times a week    Attends Religious Services: 1 to 4 times per year    Active Member of Clubs or Organizations: Yes    Attends Banker Meetings: 1 to 4 times per year    Marital Status: Widowed  Intimate Partner Violence: Not At Risk (12/07/2023)   Humiliation, Afraid, Rape, and Kick questionnaire    Fear of Current or Ex-Partner: No    Emotionally Abused: No    Physically Abused: No    Sexually Abused: No     Review of Systems    General:  No chills, fever, night sweats or weight changes.  Cardiovascular:  No chest pain, dyspnea on exertion, edema, orthopnea, palpitations, paroxysmal nocturnal dyspnea. Dermatological: No rash, lesions/masses Respiratory: No cough, dyspnea Urologic: No hematuria, dysuria Abdominal:   No nausea, vomiting, diarrhea, bright red blood per rectum, melena, or hematemesis Neurologic:  No visual changes, wkns, changes in mental status. All other systems reviewed and are otherwise negative except as noted above.  Physical Exam    VS:  BP (!) 118/58 (BP Location: Left Arm, Patient Position: Sitting, Cuff Size: Normal)   Pulse (!) 58   Ht 5\' 2"  (1.575 m)   Wt 140 lb (63.5 kg)   SpO2 95%   BMI 25.61 kg/m  , BMI Body mass index is 25.61 kg/m. GEN: Well nourished, well developed, in no acute distress. HEENT: normal. Neck: Supple, no JVD, carotid bruits, or masses. Cardiac: RRR, no murmurs, rubs, or gallops. No clubbing, cyanosis, generalized left ankle greater than right  edema.  Radials/DP/PT 2+ and equal bilaterally.  Respiratory:  Respirations regular and unlabored, clear to  auscultation bilaterally.   GI: Soft, nontender, nondistended, BS + x 4. MS: no deformity or atrophy.   Skin: warm and dry, no rash. Neuro:  Strength and sensation are intact. Psych: Normal affect.  Accessory Clinical Findings  Recent Labs: 11/24/2023: Magnesium 2.6 12/07/2023: B Natriuretic Peptide 756.6 12/08/2023: BUN 21; Creatinine, Ser 0.79; Hemoglobin 11.8; Platelets 165; Potassium 4.1; Sodium 134 01/20/2024: ALT 19   Recent Lipid Panel    Component Value Date/Time   CHOL 183 01/20/2024 0821   TRIG 78 01/20/2024 0821   HDL 75 01/20/2024 0821   CHOLHDL 2.4 01/20/2024 0821   CHOLHDL 2.7 05/26/2022 1046   LDLCALC 94 01/20/2024 0821   LDLCALC 125 (H) 05/26/2022 1046         ECG personally reviewed by me today-none today.  Nuclear stress testing 04/04/2022    The study is normal. Findings are consistent with no prior ischemia and no prior myocardial infarction. The study is low risk.   No ST deviation was noted.   LV perfusion is normal. There is no evidence of ischemia. There is no evidence of infarction.   Left ventricular function is normal. Nuclear stress EF: 64 %. The left ventricular ejection fraction is normal (55-65%). End diastolic cavity size is normal. End systolic cavity size is normal.   Prior study available for comparison from 12/08/2018.  Echocardiogram 04/17/2022  IMPRESSIONS     1. Global longitudinal strain is -17.1% Improved from echo in Dec 2022.  Left ventricular ejection fraction, by estimation, is 65 to 70%. The left  ventricle has normal function. The left ventricle has no regional wall  motion abnormalities. Left  ventricular diastolic parameters were normal.   2. Right ventricular systolic function is mildly reduced. The right  ventricular size is normal. There is moderately elevated pulmonary artery  systolic pressure.   3. Right atrial size was severely dilated.   4. The mitral valve is normal in structure. Mild mitral valve  regurgitation.    5. Tricuspid valve regurgitation is mild to moderate.   6. The aortic valve is tricuspid. Aortic valve regurgitation is mild.  Aortic valve sclerosis is present, with no evidence of aortic valve  stenosis.   FINDINGS   Left Ventricle: Global longitudinal strain is -17.1% Improved from echo  in Dec 2022. Left ventricular ejection fraction, by estimation, is 65 to  70%. The left ventricle has normal function. The left ventricle has no  regional wall motion abnormalities.  The left ventricular internal cavity size was normal in size. There is no  left ventricular hypertrophy. Left ventricular diastolic parameters were  normal.   Right Ventricle: The right ventricular size is normal. Right vetricular  wall thickness was not assessed. Right ventricular systolic function is  mildly reduced. There is moderately elevated pulmonary artery systolic  pressure. The tricuspid regurgitant  velocity is 3.14 m/s, and with an assumed right atrial pressure of 8 mmHg,  the estimated right ventricular systolic pressure is 47.4 mmHg.   Left Atrium: Left atrial size was normal in size.   Right Atrium: Right atrial size was severely dilated.   Pericardium: There is no evidence of pericardial effusion.   Mitral Valve: The mitral valve is normal in structure. Mild mitral valve  regurgitation.   Tricuspid Valve: The tricuspid valve is normal in structure. Tricuspid  valve regurgitation is mild to moderate.   Aortic Valve: The aortic valve is tricuspid. Aortic valve regurgitation is  mild. Aortic regurgitation PHT measures 612 msec. Aortic valve sclerosis  is present, with no evidence of aortic valve stenosis.   Pulmonic Valve: The pulmonic valve was normal in structure. Pulmonic valve  regurgitation is not visualized.   Aorta: The aortic root and ascending aorta are structurally normal, with  no evidence of dilitation.   IAS/Shunts: No atrial level shunt detected by color flow Doppler.    Echocardiogram 02/03/2023  IMPRESSIONS     1. Left ventricular ejection fraction, by estimation, is 60 to 65%. The  left ventricle has normal function. The left ventricle has no regional  wall motion abnormalities. Left ventricular diastolic parameters are  indeterminate. The average left  ventricular global longitudinal strain is -17.9 %. The global longitudinal  strain is normal.   2. Right ventricular systolic function is normal. The right ventricular  size is normal. There is mildly elevated pulmonary artery systolic  pressure. The estimated right ventricular systolic pressure is 40.9 mmHg.   3. The mitral valve is degenerative. Mild mitral valve regurgitation. No  evidence of mitral stenosis. Moderate mitral annular calcification.   4. Tricuspid valve regurgitation is moderate.   5. The aortic valve has an indeterminant number of cusps. Aortic valve  regurgitation is mild to moderate. Aortic valve sclerosis/calcification is  present, without any evidence of aortic stenosis. Aortic regurgitation PHT  measures 341 msec. By PHT the AI   is moderate but visually appears mild.   6. The inferior vena cava is normal in size with greater than 50%  respiratory variability, suggesting right atrial pressure of 3 mmHg.   7. Right atrial size was severely dilated.   8. Left atrial size was mildly dilated.   FINDINGS   Left Ventricle: Left ventricular ejection fraction, by estimation, is 60  to 65%. The left ventricle has normal function. The left ventricle has no  regional wall motion abnormalities. The average left ventricular global  longitudinal strain is -17.9 %.  The global longitudinal strain is normal. The left ventricular internal  cavity size was normal in size. There is no left ventricular hypertrophy.  Left ventricular diastolic parameters are indeterminate. Normal left  ventricular filling pressure.   Right Ventricle: The right ventricular size is normal. No increase in   right ventricular wall thickness. Right ventricular systolic function is  normal. There is mildly elevated pulmonary artery systolic pressure. The  tricuspid regurgitant velocity is 3.08   m/s, and with an assumed right atrial pressure of 3 mmHg, the estimated  right ventricular systolic pressure is 40.9 mmHg.   Left Atrium: Left atrial size was mildly dilated.   Right Atrium: Right atrial size was severely dilated.   Pericardium: There is no evidence of pericardial effusion.   Mitral Valve: The mitral valve is degenerative in appearance. There is  mild thickening of the mitral valve leaflet(s). Moderate mitral annular  calcification. Mild mitral valve regurgitation. No evidence of mitral  valve stenosis.   Tricuspid Valve: The tricuspid valve is normal in structure. Tricuspid  valve regurgitation is moderate . No evidence of tricuspid stenosis.   Aortic Valve: By PHT the AI is moderate but visually appears mild. The  aortic valve has an indeterminant number of cusps. Aortic valve  regurgitation is mild to moderate. Aortic regurgitation PHT measures 341  msec. Aortic valve sclerosis/calcification is  present, without any evidence of aortic stenosis.   Pulmonic Valve: The pulmonic valve was normal in structure. Pulmonic valve  regurgitation is trivial. No evidence of pulmonic stenosis.   Aorta: The aortic root is normal in size and structure.   Venous: The inferior vena cava is normal in size with greater than 50%  respiratory variability, suggesting right atrial pressure of 3 mmHg.   IAS/Shunts: No atrial level shunt detected by color flow Doppler.   Echocardiogram 11/21/2023  IMPRESSIONS     1. Left ventricular ejection fraction, by estimation, is 60 to 65%. The  left ventricle has normal function. The left ventricle has no regional  wall motion abnormalities. Left ventricular diastolic parameters are  indeterminate.   2. Right ventricular systolic function is normal.  The right ventricular  size is mildly enlarged. There is severely elevated pulmonary artery  systolic pressure.   3. Left atrial size was severely dilated.   4. Right atrial size was severely dilated.   5. The mitral valve is normal in structure. Mild mitral valve  regurgitation. No evidence of mitral stenosis.   6. Tricuspid valve regurgitation is moderate.   7. The aortic valve is tricuspid. Aortic valve regurgitation is mild. No  aortic stenosis is present.   8. The inferior vena cava is dilated in size with >50% respiratory  variability, suggesting right atrial pressure of 8 mmHg.   FINDINGS   Left Ventricle: Left ventricular ejection fraction, by estimation, is 60  to 65%. The left ventricle has normal function. The left ventricle has no  regional wall motion abnormalities. The left ventricular internal cavity  size was normal in size. There is   no left ventricular hypertrophy. Left ventricular diastolic parameters  are indeterminate.   Right Ventricle: The right ventricular size is mildly enlarged. Right  ventricular systolic function is normal. There is severely elevated  pulmonary artery systolic pressure. The tricuspid regurgitant velocity is  3.70 m/s, and with an assumed right atrial   pressure of 8 mmHg, the estimated right ventricular systolic pressure is  62.8 mmHg.   Left Atrium: Left atrial size was severely dilated.   Right Atrium: Right atrial size was severely dilated.   Pericardium: There is no evidence of pericardial effusion.   Mitral Valve: The mitral valve is normal in structure. Mild mitral annular  calcification. Mild mitral valve regurgitation. No evidence of mitral  valve stenosis.   Tricuspid Valve: The tricuspid valve is normal in structure. Tricuspid  valve regurgitation is moderate . No evidence of tricuspid stenosis.   Aortic Valve: The aortic valve is tricuspid. Aortic valve regurgitation is  mild. Aortic regurgitation PHT measures 320  msec. No aortic stenosis is  present. Aortic valve mean gradient measures 4.0 mmHg. Aortic valve peak  gradient measures 7.1 mmHg. Aortic   valve area, by VTI measures 2.07 cm.   Pulmonic Valve: The pulmonic valve was normal in structure. Pulmonic valve  regurgitation is not visualized. No evidence of pulmonic stenosis.   Aorta: The aortic root is normal in size and structure.   Venous: The inferior vena cava is dilated in size with greater than 50%  respiratory variability, suggesting right atrial pressure of 8 mmHg.   IAS/Shunts: No atrial level shunt detected by color flow Doppler.   Coronary CTA 09/18/2023 FINDINGS: Non-cardiac: See separate report from [facility]. No significant findings on limited lung and soft tissue windows.   Calcium Score: LM and 3 vessel calcium noted   LM 136   LAD 354   LCX 434   RCA 1075   Total 1999   Coronary Arteries: Right dominant with no anomalies   LM: 25-49% ostial calcified plaque   LAD: 25-49% calcified proximal disease, 50-69% calcified mid vessel disease   D1: Normal   D2: Normal   Circumflex: 25-49% proximal / mid vessel plaque   OM1: 25-49% calcific plaque   AV groove: Normal   RCA: 50-69% calcified ostial stenosis, 25-49% calcified mid/distal stenosis   PDA:  Normal   PLA: Normal   IMPRESSION: 1. LM and 3 vessel calcium Score 1999 which is not ranked as patients age above cut off for MESA data base   2. Normal ascending thoracic aorta diameter 3.4 cm with severe calcific atherosclerosis   3.  CAD RADS 3 possibly obstructive CAD Study sent for FFR CT   Charlton Haws   Electronically Signed: By: Charlton Haws M.D. On: 09/18/2023 17:19   FFR 09/18/2023  FINDINGS: LAD: Normal 99 proximal, 0.97 mid and 0.94 distal   RCA: Normal 0.98 proximal, 0.94 mid and 0.95 distal   LCX: Normal 0.99 proximal and 0.99 mid   IMPRESSION: Negative FFR CT for obstrucitve CAD   Charlton Haws      Electronically Signed   By: Charlton Haws M.D.   On: 09/18/2023 17:23   Assessment & Plan   1. Shortness of breath/DOE-breathing well today.  Has chronic shortness of breath.  Echocardiogram 11/21/2023 showed LVEF of 60-65%, intermediate diastolic parameters, severely dilated left and right atria, mild mitral valve regurgitation, moderate tricuspid valve regurgitation.  Was previously noted to have spot on her lung which is felt to be lung cancer.  Pulmonology did not feel that her fatigue and DOE were affected by pathology.  She also underwent coronary CTA/FFR which was reassuring.  We reviewed the importance of maintaining iron rich diet and combining that with vitamin C. Maintain physical activity Continue to monitor   Coronary artery disease-denies anginal type symptoms.  Coronary CTA 09/18/2023 showed left main 25-49%, LAD 25-49%, normal D1 and D2, circumflex 25-49%, OM1 25-49% calcified plaque, RCA 50-69% followed by 25-49% mid/distal stenosis.  FFR showed no obstructive coronary disease.  Details above. Continue heart healthy low-sodium high-fiber diet Amlodipine, ezetimibe, metoprolol, rosuvastatin Heart healthy diet  Cardiomyopathy-continues to note fatigue.  Weight today 140 pounds.    Echo 02/03/23 showed EF of 60-65%, intermediate diastolic parameters, mild mitral valve regurgitation, and moderate tricuspid valve regurgitation.  Her right atrium was severely dilated and left atrium was mildly dilated.    Coronary CTA with FFR reassuring. Continue furosemide to 40 mg 4 days/week and continue 20 mg 3 days/week. Daily weights Lower extremity support stockings  Paroxysmal atrial fibrillation-heart rate 58 bpm.  Sinus bradycardia.  Continues to notice intermittent episodes of palpitations.   CHA2DS2-VASc score 5.  She denies bleeding issues.  Continue Tikosyn, bisoprolol, apixaban to 5 mg twice daily Continue to avoid triggers Follows with A-fib clinic, EP  Essential hypertension-BP  today 122/58. Maintain blood pressure log.  Continue bisoprolol, irbesartan, furosemide, amlodipine Heart healthy low-sodium diet Increase physical activity as tolerated    Disposition: Follow-up with Dr. Rennis Golden or me in 4-6 months .   Thomasene Ripple. Emonni Depasquale NP-C     01/26/2024, 3:13 PM [facility] [address] Office [phone] Fax [phone]    I spent 14 minutes examining this patient, reviewing medications, and using patient centered shared decision making involving her cardiac care.  Prior to her visit I spent greater than 20 minutes reviewing her past medical history,  medications, and prior cardiac tests.

## 2024-01-25 NOTE — Telephone Encounter (Signed)
 CALLED PATIENT'S DAUGHTER - BETSY GREER TO INFORM OF HER MOTHER'S CT SCAN ON 01-31-24 - ARRIVAL TIME- 1:45 PM @ [facility], NO RESTRICTIONS TO SCAN, PATIENT TO RECEIVE RESULTS FROM DR. KINARD ON 02-04-24 @ 11:30. AM, LVM FOR A RETURN CALL

## 2024-01-26 ENCOUNTER — Encounter: Payer: Self-pay | Admitting: General Practice

## 2024-01-26 ENCOUNTER — Ambulatory Visit: Attending: General Practice | Admitting: General Practice

## 2024-01-26 VITALS — BP 118/58 | HR 58 | Ht 62.0 in | Wt 140.0 lb

## 2024-01-26 DIAGNOSIS — I10 Essential (primary) hypertension: Secondary | ICD-10-CM | POA: Diagnosis not present

## 2024-01-26 DIAGNOSIS — R0609 Other forms of dyspnea: Secondary | ICD-10-CM | POA: Diagnosis not present

## 2024-01-26 DIAGNOSIS — I480 Paroxysmal atrial fibrillation: Secondary | ICD-10-CM | POA: Insufficient documentation

## 2024-01-26 DIAGNOSIS — I429 Cardiomyopathy, unspecified: Secondary | ICD-10-CM | POA: Diagnosis not present

## 2024-01-26 DIAGNOSIS — I428 Other cardiomyopathies: Secondary | ICD-10-CM | POA: Diagnosis not present

## 2024-01-26 DIAGNOSIS — I2510 Atherosclerotic heart disease of native coronary artery without angina pectoris: Secondary | ICD-10-CM | POA: Insufficient documentation

## 2024-01-26 NOTE — Patient Instructions (Signed)
 Medication Instructions:  INCREASE VITAMIN C IN YOUR DIET The current medical regimen is effective;  continue present plan and medications as directed. Please refer to the Current Medication list given to you today.  *If you need a refill on your cardiac medications before your next appointment, please call your pharmacy*  Lab Work: NONE If you have labs (blood work) drawn today and your tests are completely normal, you will receive your results only by:  MyChart Message (if you have MyChart) OR  A paper copy in the mail If you have any lab test that is abnormal or we need to change your treatment, we will call you to review the results.  Follow-Up: At [facility], you and your health needs are our priority.  As part of our continuing mission to provide you with exceptional heart care, we have created designated Provider Care Teams.  These Care Teams include your primary Cardiologist (physician) and Advanced Practice Providers (APPs -  Physician Assistants and Nurse Practitioners) who all work together to provide you with the care you need, when you need it.  Your next appointment:   4 month(s)  Provider:   Chrystie Nose, MD  or Edd Fabian, FNP        Other Instructions PLEASE READ AND FOLLOW INCREASED IRON DIET-ATTACHED MAINTAIN YOUR PHYSICAL ACTIVITY       Iron-Rich Diet  Iron is a mineral that helps your body produce hemoglobin. Hemoglobin is a protein in red blood cells that carries oxygen to your body's tissues. Eating too little iron may cause you to feel weak and tired, and it can increase your risk of infection. Iron is naturally found in many foods, and many foods have iron added to them (are iron-fortified). You may need to follow an iron-rich diet if you do not have enough iron in your body due to certain medical conditions. The amount of iron that you need each day depends on your age, your sex, and any medical conditions you have. Follow instructions from your  health care provider or a dietitian about how much iron you should eat each day. What are tips for following this plan? Reading food labels Check food labels to see how many milligrams (mg) of iron are in each serving. Cooking Cook foods in pots and pans that are made from iron. Take these steps to make it easier for your body to absorb iron from certain foods: Soak beans overnight before cooking. Soak whole grains overnight and drain them before using. Ferment flours before baking, such as by using yeast in bread dough. Meal planning When you eat foods that contain iron, you should eat them with foods that are high in vitamin C. These include oranges, peppers, tomatoes, potatoes, and mangoes. Vitamin C helps your body absorb iron. Certain foods and drinks prevent your body from absorbing iron properly. Avoid eating these foods in the same meal as iron-rich foods or with iron supplements. These foods include: Coffee, black tea, and red wine. Milk, dairy products, and foods that are high in calcium. Beans and soybeans. Whole grains. General information Take iron supplements only as told by your health care provider. An overdose of iron can be life-threatening. If you were prescribed iron supplements, take them with orange juice or a vitamin C supplement. When you eat iron-fortified foods or take an iron supplement, you should also eat foods that naturally contain iron, such as meat, poultry, and fish. Eating naturally iron-rich foods helps your body absorb the iron  that is added to other foods or contained in a supplement. Iron from animal sources is better absorbed than iron from plant sources. What foods should I eat? Fruits Prunes. Raisins. Eat fruits high in vitamin C, such as oranges, grapefruits, and strawberries, with iron-rich foods. Vegetables Spinach (cooked). Green peas. Broccoli. Fermented vegetables. Eat vegetables high in vitamin C, such as leafy greens, potatoes, bell  peppers, and tomatoes, with iron-rich foods. Grains Iron-fortified breakfast cereal. Iron-fortified whole-wheat bread. Enriched rice. Sprouted grains. Meats and other proteins Beef liver. Beef. [place]. Chicken. Oysters. Shrimp. Tuna. Sardines. Chickpeas. Nuts. Tofu. Pumpkin seeds. Beverages Tomato juice. Fresh orange juice. Prune juice. Hibiscus tea. Iron-fortified instant breakfast shakes. Sweets and desserts Blackstrap molasses. Seasonings and condiments Tahini. Fermented soy sauce. Other foods Wheat germ. The items listed above may not be a complete list of recommended foods and beverages. Contact a dietitian for more information. What foods should I limit? These are foods that should be limited while eating iron-rich foods as they can reduce the absorption of iron in your body. Grains Whole grains. Bran cereal. Bran flour. Meats and other proteins Soybeans. Products made from soy protein. Black beans. Lentils. Mung beans. Split peas. Dairy Milk. Cream. Cheese. Yogurt. Cottage cheese. Beverages Coffee. Black tea. Red wine. Sweets and desserts Cocoa. Chocolate. Ice cream. Seasonings and condiments Basil. Oregano. Large amounts of parsley. The items listed above may not be a complete list of foods and beverages you should limit. Contact a dietitian for more information. Summary Iron is a mineral that helps your body produce hemoglobin. Hemoglobin is a protein in red blood cells that carries oxygen to your body's tissues. Iron is naturally found in many foods, and many foods have iron added to them (are iron-fortified). When you eat foods that contain iron, you should eat them with foods that are high in vitamin C. Vitamin C helps your body absorb iron. Certain foods and drinks prevent your body from absorbing iron properly, such as whole grains and dairy products. You should avoid eating these foods in the same meal as iron-rich foods or with iron supplements. This information is  not intended to replace advice given to you by your health care provider. Make sure you discuss any questions you have with your health care provider. Document Revised: 10/01/2020 Document Reviewed: 10/01/2020 Elsevier Patient Education  2024 [employer].

## 2024-01-28 ENCOUNTER — Telehealth: Payer: Self-pay | Admitting: *Deleted

## 2024-01-28 NOTE — Telephone Encounter (Signed)
 CALLED PATIENT'S DAUGHTER- BETSY GREER TO ASK ABOUT HER MOM COMING FOR FU APPT. WITH DR. KINARD ON 02-02-24 @ 11:30 AM DUE TO DR. KINARD BEING OFF ON 02-04-24, LVM FOR A RETURN CALL

## 2024-01-29 ENCOUNTER — Ambulatory Visit (HOSPITAL_COMMUNITY)
Admission: RE | Admit: 2024-01-29 | Discharge: 2024-01-29 | Disposition: A | Discharge status: Home / Self Care | Source: Ambulatory Visit | Attending: Radiology | Admitting: Radiology

## 2024-01-29 DIAGNOSIS — C3490 Malignant neoplasm of unspecified part of unspecified bronchus or lung: Secondary | ICD-10-CM | POA: Diagnosis not present

## 2024-01-29 DIAGNOSIS — J432 Centrilobular emphysema: Secondary | ICD-10-CM | POA: Diagnosis not present

## 2024-01-29 DIAGNOSIS — R911 Solitary pulmonary nodule: Secondary | ICD-10-CM | POA: Insufficient documentation

## 2024-01-29 DIAGNOSIS — I700 Atherosclerosis of aorta: Secondary | ICD-10-CM | POA: Diagnosis not present

## 2024-02-02 ENCOUNTER — Encounter: Payer: Self-pay | Admitting: Radiation Oncology

## 2024-02-02 ENCOUNTER — Ambulatory Visit
Admission: RE | Admit: 2024-02-02 | Discharge: 2024-02-02 | Disposition: A | Discharge status: Home / Self Care | Payer: Self-pay | Source: Ambulatory Visit | Attending: Radiation Oncology | Admitting: Radiation Oncology

## 2024-02-02 VITALS — BP 128/48 | HR 68 | Temp 96.2°F | Resp 18 | Ht 62.0 in | Wt 142.0 lb

## 2024-02-02 DIAGNOSIS — I700 Atherosclerosis of aorta: Secondary | ICD-10-CM | POA: Insufficient documentation

## 2024-02-02 DIAGNOSIS — M810 Age-related osteoporosis without current pathological fracture: Secondary | ICD-10-CM | POA: Diagnosis not present

## 2024-02-02 DIAGNOSIS — Z7901 Long term (current) use of anticoagulants: Secondary | ICD-10-CM | POA: Diagnosis not present

## 2024-02-02 DIAGNOSIS — I2510 Atherosclerotic heart disease of native coronary artery without angina pectoris: Secondary | ICD-10-CM | POA: Diagnosis not present

## 2024-02-02 DIAGNOSIS — Z923 Personal history of irradiation: Secondary | ICD-10-CM | POA: Insufficient documentation

## 2024-02-02 DIAGNOSIS — Z79899 Other long term (current) drug therapy: Secondary | ICD-10-CM | POA: Insufficient documentation

## 2024-02-02 DIAGNOSIS — R911 Solitary pulmonary nodule: Secondary | ICD-10-CM | POA: Insufficient documentation

## 2024-02-02 DIAGNOSIS — J432 Centrilobular emphysema: Secondary | ICD-10-CM | POA: Insufficient documentation

## 2024-02-02 DIAGNOSIS — C3411 Malignant neoplasm of upper lobe, right bronchus or lung: Secondary | ICD-10-CM | POA: Diagnosis not present

## 2024-02-02 DIAGNOSIS — Z87891 Personal history of nicotine dependence: Secondary | ICD-10-CM | POA: Diagnosis not present

## 2024-02-02 NOTE — Progress Notes (Signed)
 Holly Benson is here today for follow up post radiation to the lung.  Lung Side: Right, patient completed treatment on 09/29/23  Does the patient complain of any of the following: Pain: No Shortness of breath w/wo exertion: Yes, reports shortness of breath at rest, worsens on exertion.  Cough: No Hemoptysis: No Pain with swallowing: No Swallowing/choking concerns: No Appetite: Good Energy Level: Fair Post radiation skin Changes: No    Additional comments if applicable:   BP (!) 128/48 (BP Location: Left Arm, Patient Position: Sitting)   Pulse 68   Temp (!) 96.2 F (35.7 C) (Temporal)   Resp 18   Ht 5\' 2"  (1.575 m)   Wt 142 lb (64.4 kg)   SpO2 100%   BMI 25.97 kg/m

## 2024-02-02 NOTE — Progress Notes (Signed)
 Radiation Oncology         [phone]) [phone] ________________________________  Name: Holly Benson MRN: [id number]  Date: 02/02/2024  DOB: 29-Jul-1933  Follow-Up Visit Note  CC: Philip Aspen, Limmie Patricia, MD  Leslye Peer, MD    ICD-10-CM   1. Lung nodule  R91.1 CT CHEST WO CONTRAST      Diagnosis:   The encounter diagnosis was Lung nodule [R91.1].   Slowly enlarging RUL pulmonary nodule, with features suggestive of non-small cell lung cancer (non-biopsy proven)      Interval Since Last Radiation: 4 month and 7 days    Indication for treatment: Curative         Radiation treatment dates: First Treatment Date: 2023-09-22 - Last Treatment Date: 2023-09-29   Site/Dose/Technique/Mode:    Site: Lung, Right Technique: SBRT/SRT-IMRT Mode: Photon Dose Per Fraction: 18 Gy Prescribed Dose (Delivered / Prescribed): 54 Gy / 54 Gy Prescribed Fxs (Delivered / Prescribed): 3 / 3  Narrative:  The patient returns today for routine follow-up and to review most recent imaging. She was last seen in office on 11-02-23 for a routine follow up. Since then, she continued to follow up with her specialists to manage her chronic conditions.                               Since then patient presented to the ED on 11-20-23 with complains of SOB and a cough. CTA done at that time, showed no pulmonary embolism along with a stable 11 mm right apical nodule and interval development of multiple nodules within the left upper lobe measuring up to 13 x 18 mm, nonspecific, possibly of infectious origins. DG chest showed no evidence of acute cardiopulmonary abnormality. She returned to the ED on 12-07-23 with similar symptoms. DG chest done at that time also showed no acute cardiopulmonary abnormality. Patient was treated with antibiotics and prednisone.    Most recent CT chest done on 01-29-24 showed interval progression of volume loss with consolidative opacity in the right apex with right upper lobe pulmonary  nodule appears similar in size but is more anteriorly positioned likely due to volume loss. Scan also indicated the multiple left upper lobe pulmonary nodules are similar to smaller in the interval with interval development of new left upper lobe small pulmonary nodules.   Of note: -- Bone density done on 11-05-23 revealed that patient is considered osteoporotic according to [employer] [facility]) criteria.   On evaluation today she denies any changes in her breathing.  She reports chronic mild shortness of breath and some dyspnea with exertion.  She does not have supplemental oxygen in use.  Allergies:  is allergic to lovenox [enoxaparin sodium] and metoprolol succinate [metoprolol].  Meds: Current Outpatient Medications  Medication Sig Dispense Refill   acetaminophen (TYLENOL) 325 MG tablet Take 325-650 mg by mouth every 6 (six) hours as needed for mild pain (pain score 1-3), headache or fever.     albuterol (PROVENTIL) (2.5 MG/3ML) 0.083% nebulizer solution Take 3 mLs (2.5 mg total) by nebulization every 6 (six) hours as needed for wheezing or shortness of breath. 75 mL 5   albuterol (VENTOLIN HFA) 108 (90 Base) MCG/ACT inhaler INHALE TWO PUFFS BY MOUTH EVERY 6 HOURS AS NEEDED FOR WHEEZING OR FOR SHORTNESS OF BREATH (Patient taking differently: Inhale 2 puffs into the lungs every 6 (six) hours as needed for wheezing or shortness of breath.) 18 g 6  amLODipine (NORVASC) 5 MG tablet Take 1 tablet (5 mg total) by mouth daily. 30 tablet 7   apixaban (ELIQUIS) 5 MG TABS tablet Take 1 tablet (5 mg total) by mouth 2 (two) times daily. 180 tablet 1   bisoprolol (ZEBETA) 10 MG tablet TAKE 1 TABLET BY MOUTH AT BEDTIME (Patient taking differently: Take 10 mg by mouth daily.) 90 tablet 3   Cholecalciferol (VITAMIN D3 MAXIMUM STRENGTH) 125 MCG (5000 UT) capsule Take 5,000 Units by mouth daily.     clonazePAM (KLONOPIN) 0.5 MG tablet Take 0.5 tablets (0.25 mg total) by mouth at bedtime as needed for  anxiety. (Patient taking differently: Take 0.25 mg by mouth at bedtime as needed (for restless legs).) 30 tablet 2   docusate sodium (COLACE) 100 MG capsule Take 300 mg by mouth daily as needed for moderate constipation.     dofetilide (TIKOSYN) 250 MCG capsule TAKE 1 CAPSULE BY MOUTH 2 TIMES A DAY. 180 capsule 2   fluticasone (FLONASE) 50 MCG/ACT nasal spray Place 2 sprays into both nostrils daily. 18.2 mL 2   furosemide (LASIX) 20 MG tablet Take 1 tablet by mouth on Monday, Wednesday and Friday. Take 2 tablets by mouth on all other days (Patient taking differently: Take 20-40 mg by mouth See admin instructions. Take 20 mg by mouth in the morning on Sun/Tues/Thurs/Sat and 40 mg on Mon/Wed/Fri) 132 tablet 3   irbesartan (AVAPRO) 300 MG tablet TAKE 1 TABLET BY MOUTH DAILY 90 tablet 1   MAGNESIUM OXIDE PO Take 1 tablet by mouth daily.     Misc Natural Products (COLON CLEANSE) CAPS Take 4 capsules by mouth daily as needed (constipation).     Multiple Vitamins-Minerals (PRESERVISION AREDS 2) CAPS Take 1 capsule by mouth 2 (two) times daily.     polyethylene glycol (MIRALAX / GLYCOLAX) 17 g packet Take 17 g by mouth daily as needed for moderate constipation (MIX AS DIRECTED).     rOPINIRole (REQUIP) 0.5 MG tablet TAKE 1 TABLET BY MOUTH EVERY MORNING AND TAKE 1 TABLET BY MOUTH AFTER 3PM AND TAKE 1 TABLET AFTER DINNER 8PM 90 tablet 5   rosuvastatin (CRESTOR) 20 MG tablet Take 1 tablet (20 mg total) by mouth daily. 90 tablet 3   STIOLTO RESPIMAT 2.5-2.5 MCG/ACT AERS Inhale 2 puffs into the lungs in the morning.     No current facility-administered medications for this encounter.   Facility-Administered Medications Ordered in Other Encounters  Medication Dose Route Frequency Provider Last Rate Last Admin   technetium tetrofosmin (TC-MYOVIEW) injection 31.9 millicurie  31.9 millicurie Intravenous Once PRN Chilton Si, MD        Physical Findings: [name]. Patient is  alert and oriented.  height is 5\' 2"  (1.575 m) and weight is 142 lb (64.4 kg). Her temporal temperature is 96.2 F (35.7 C) (abnormal). Her blood pressure is 128/48 (abnormal) and her pulse is 68. Her respiration is 18 and oxygen saturation is 100%. .  No significant changes. Lungs are clear to auscultation bilaterally. Heart has regular rate and rhythm. No palpable cervical, supraclavicular, or axillary adenopathy. Abdomen soft, non-tender, normal bowel sounds.   Lab Findings: Lab Results  Component Value Date   WBC 2.8 (L) 12/08/2023   HGB 11.8 (L) 12/08/2023   HCT 36.1 12/08/2023   MCV 100.6 (H) 12/08/2023   PLT 165 12/08/2023    Radiographic Findings: CT CHEST WO CONTRAST Result Date: 02/01/2024 CLINICAL DATA:  Non-small-cell lung cancer.  Restaging. EXAM: CT  CHEST WITHOUT CONTRAST TECHNIQUE: Multidetector CT imaging of the chest was performed following the standard protocol without IV contrast. RADIATION DOSE REDUCTION: This exam was performed according to the departmental dose-optimization program which includes automated exposure control, adjustment of the mA and/or kV according to patient size and/or use of iterative reconstruction technique. COMPARISON:  Chest CTA 11/20/2023.  Standard CT chest 07/14/2023. FINDINGS: Cardiovascular: Heart size upper normal. No substantial pericardial effusion. Coronary artery calcification is evident. Moderate atherosclerotic calcification is noted in the wall of the thoracic aorta. Mediastinum/Nodes: No mediastinal lymphadenopathy. No evidence for gross hilar lymphadenopathy although assessment is limited by the lack of intravenous contrast on the current study. The esophagus has normal imaging features. There is no axillary lymphadenopathy. Lungs/Pleura: Biapical pleuroparenchymal scarring again noted. The posterior right upper lobe nodule seen on 07/14/2023 is no longer evident although there is new volume loss and consolidative opacity in the anterior  right upper lung towards the apex, progressive since 11/20/2023. The 11 mm nodule seen on the 11/20/2023 exam may be visible on image 19/6 today again measuring 11 mm and more anteriorly located as a consequence of volume loss/evolving scar in the anterior right upper lobe. 8 mm left upper lobe nodule on 61/6 was 11 mm previously. Irregular 11 x 9 mm left upper lobe nodule on 65/6 today was 18 x 13 mm previously. Additional left upper lobe nodularity seen on image 72/6 today's similar to prior. 8 mm left upper lobe nodule on 85/6 and 5 mm left upper lobe nodule on 81/6 are new in the interval. Centrilobular and paraseptal emphysema evident. No pulmonary edema or pleural effusion. Upper Abdomen: Visualized portion of the upper abdomen shows no acute findings. Musculoskeletal: No worrisome lytic or sclerotic osseous abnormality. IMPRESSION: 1. Interval progression of volume loss with consolidative opacity in the right apex, presumably evolving post radiation scarring. Right upper lobe pulmonary nodule appears similar in size but is more anteriorly positioned on today's study, likely as a consequence of the volume loss anteriorly in the right upper lung. 2. The multiple left upper lobe pulmonary nodules are similar to smaller in the interval with interval development of new left upper lobe small pulmonary nodules. While likely infectious/inflammatory, neoplastic etiology cannot be excluded. 3.  Emphysema (ICD10-J43.9) and Aortic Atherosclerosis (ICD10-170.0) Electronically Signed   By: Kennith Center M.D.   On: 02/01/2024 12:24    Impression:  Slowly enlarging RUL pulmonary nodule, with features suggestive of non-small cell lung cancer (non-biopsy proven)    She tolerated her SBRT well without any significant changes in her breathing or other issues.  No evidence of recurrence on clinical exam today.  Recent chest CT scan favorable.  Plan: Routine follow-up in 6 months.  Prior to this follow-up appointment the  patient will have a repeat CT scan of the chest.   30 minutes of total time was spent for this patient encounter, including preparation, face-to-face counseling with the patient and coordination of care, physical exam, and documentation of the encounter. ____________________________________  Billie Lade, PhD, MD  This document serves as a record of services personally performed by Antony Blackbird, MD. It was created on his behalf by Herbie Saxon, a trained medical scribe. The creation of this record is based on the scribe's personal observations and the provider's statements to them. This document has been checked and approved by the attending provider.

## 2024-02-04 ENCOUNTER — Ambulatory Visit: Payer: Self-pay | Admitting: Radiation Oncology

## 2024-02-05 ENCOUNTER — Ambulatory Visit: Payer: Medicare Other | Admitting: Emergency Medicine

## 2024-02-10 ENCOUNTER — Encounter: Payer: Self-pay | Admitting: Emergency Medicine

## 2024-02-10 ENCOUNTER — Ambulatory Visit: Admitting: Emergency Medicine

## 2024-02-10 VITALS — BP 126/54 | HR 61 | Ht 62.0 in | Wt 141.0 lb

## 2024-02-10 DIAGNOSIS — J449 Chronic obstructive pulmonary disease, unspecified: Secondary | ICD-10-CM

## 2024-02-10 DIAGNOSIS — Z85118 Personal history of other malignant neoplasm of bronchus and lung: Secondary | ICD-10-CM | POA: Diagnosis not present

## 2024-02-10 DIAGNOSIS — G4739 Other sleep apnea: Secondary | ICD-10-CM

## 2024-02-10 DIAGNOSIS — R0602 Shortness of breath: Secondary | ICD-10-CM

## 2024-02-10 NOTE — Assessment & Plan Note (Signed)
 Some increased shortness of breath over the last 3 to 4 months.  This could be in the setting of her hospitalization for influenza at the beginning of the year, also notes that she just underwent XRT to her right upper lobe and has some right apical scarring.  I think we should give her a little bit more time to her build back up her strength following these episodes.  If she continues to have exertional dyspnea when we follow-up then we could consider alternative BD therapy, other workup.

## 2024-02-10 NOTE — Assessment & Plan Note (Signed)
 Depend on how her breathing is doing at her next visit we will consider possible alternative Stiolto.  She does not flare frequently, does not deal with a high sputum burden so not clear that we need to add ICS at this time.   Please continue Stiolto 2 puffs once daily. Keep your albuterol available to use 2 puffs when needed for shortness of breath, chest tightness, wheezing.

## 2024-02-10 NOTE — Assessment & Plan Note (Addendum)
 Right upper lobe pulmonary nodule treated empirically.  Reviewed her CT chest with her today.  She has a little bit of right upper lobe scarring.  Next CT to be done in 6 months.

## 2024-02-10 NOTE — Patient Instructions (Signed)
 We reviewed your CT scan of the chest today. Get your repeat CT chest as directed by Dr. Roselind Messier Please continue Stiolto 2 puffs once daily. Keep your albuterol available to use 2 puffs when needed for shortness of breath, chest tightness, wheezing. Follow Dr. Delton Coombes in September.  Please call sooner if you have any problems.

## 2024-02-10 NOTE — Assessment & Plan Note (Signed)
 Continue nocturnal ventilation

## 2024-02-10 NOTE — Progress Notes (Signed)
 Subjective:    Patient ID: Holly Benson, female    DOB: February 23, 1933, 89 y.o.   MRN: [id number]  COPD She complains of cough and shortness of breath. There is no wheezing. Pertinent negatives include no ear pain, fever, headaches, postnasal drip, rhinorrhea, sneezing, sore throat or trouble swallowing. Her past medical history is significant for COPD.   ROV 07/30/2023 --follow-up visit for 89 year old woman with history of severe COPD on Stiolto.  She has a slowly enlarging pulmonary nodule that we have followed with serial imaging and which is PET positive.  PMH also significant for A-fib, GERD, OSA/OHS for which she uses her servo ventilator, chronic rhinitis and GERD with chronic cough.  We have been following her pulmonary nodule conservatively.  Most recent CT chest as below On stiolto, albuterol once a day, does help her.   CT scan of the chest 07/14/2023 reviewed by me, shows continued slow increase in size in her posterior right upper lobe pulmonary nodule, now 13 x 11 mm.  No mediastinal or hilar adenopathy.  ROV 02/10/24 --89 year old woman with a history of severe COPD.  Past medical history also significant for A-fib, GERD, OSA/OHS for which she uses servo ventilator, chronic cough with GERD and chronic rhinitis.  She has a slowly enlarging pulmonary nodule in the right upper lobe that we followed on serial imaging, underwent empiric SBRT, completed about 4 months ago. She is dealing with some progressive exertional SOB over the last 3-4 months. She had Flu A, hospitalized for COPD flare. She has been a bit slow to bounce back. She is on stiolto. Uses albuterol rarely right now. She feels that she has some increased edema, on lasix. Off flonase right now.   CT chest 01/29/2024 reviewed by me, shows some biapical scarring, volume loss in the posterior right upper lobe progressive since 11/20/2023, presumed radiation change.  Multiple left upper lobe pulmonary nodule similar to smaller than  prior   Review of Systems  Constitutional:  Negative for fever and unexpected weight change.  HENT:  Negative for congestion, dental problem, ear pain, nosebleeds, postnasal drip, rhinorrhea, sinus pressure, sneezing, sore throat and trouble swallowing.   Eyes:  Negative for redness and itching.  Respiratory:  Positive for cough and shortness of breath. Negative for chest tightness and wheezing.   Cardiovascular:  Negative for palpitations and leg swelling.  Gastrointestinal:  Negative for nausea and vomiting.  Genitourinary:  Negative for dysuria.  Musculoskeletal:  Negative for joint swelling.  Skin:  Negative for rash.  Neurological:  Negative for headaches.  Hematological:  Does not bruise/bleed easily.  Psychiatric/Behavioral:  Negative for dysphoric mood. The patient is not nervous/anxious.        Objective:   Physical Exam Vitals:   02/10/24 0856  BP: (!) 126/54  Pulse: 61  SpO2: 95%  Weight: 141 lb (64 kg)  Height: 5\' 2"  (1.575 m)   Gen: Pleasant, thin woman, in no distress,  normal affect, mild kyphosis  ENT: No lesions,  mouth clear,  oropharynx clear, no postnasal drip  Neck: No JVD, no stridor  Lungs: No use of accessory muscles, few bibasilar inspiratory crackles  Cardiovascular: RRR, heart sounds normal, no murmur or gallops, no peripheral edema  Musculoskeletal: joints of hands swollen, nodular  Neuro: alert, non focal  Skin: Warm, no lesions or rashes      Assessment & Plan:  Shortness of breath Some increased shortness of breath over the last 3 to 4 months.  This could be  in the setting of her hospitalization for influenza at the beginning of the year, also notes that she just underwent XRT to her right upper lobe and has some right apical scarring.  I think we should give her a little bit more time to her build back up her strength following these episodes.  If she continues to have exertional dyspnea when we follow-up then we could consider  alternative BD therapy, other workup.  COPD (chronic obstructive pulmonary disease) (HCC) Depend on how her breathing is doing at her next visit we will consider possible alternative Stiolto.  She does not flare frequently, does not deal with a high sputum burden so not clear that we need to add ICS at this time.   Please continue Stiolto 2 puffs once daily. Keep your albuterol available to use 2 puffs when needed for shortness of breath, chest tightness, wheezing.  Complex sleep apnea syndrome Continue nocturnal ventilation  History of lung cancer Right upper lobe pulmonary nodule treated empirically.  Reviewed her CT chest with her today.  She has a little bit of right upper lobe scarring.  Next CT to be done in 6 months.      Levy Pupa, MD, PhD 02/10/2024, 9:30 AM  Pulmonary and Critical Care [phone] or if no answer [phone]

## 2024-02-17 DIAGNOSIS — H04123 Dry eye syndrome of bilateral lacrimal glands: Secondary | ICD-10-CM | POA: Diagnosis not present

## 2024-02-17 DIAGNOSIS — H5211 Myopia, right eye: Secondary | ICD-10-CM | POA: Diagnosis not present

## 2024-02-17 DIAGNOSIS — Z961 Presence of intraocular lens: Secondary | ICD-10-CM | POA: Diagnosis not present

## 2024-02-17 DIAGNOSIS — H5202 Hypermetropia, left eye: Secondary | ICD-10-CM | POA: Diagnosis not present

## 2024-02-17 DIAGNOSIS — H52223 Regular astigmatism, bilateral: Secondary | ICD-10-CM | POA: Diagnosis not present

## 2024-02-17 DIAGNOSIS — H1849 Other corneal degeneration: Secondary | ICD-10-CM | POA: Diagnosis not present

## 2024-02-17 DIAGNOSIS — H353131 Nonexudative age-related macular degeneration, bilateral, early dry stage: Secondary | ICD-10-CM | POA: Diagnosis not present

## 2024-02-18 ENCOUNTER — Encounter: Payer: Self-pay | Admitting: Neurology

## 2024-02-18 ENCOUNTER — Ambulatory Visit (INDEPENDENT_AMBULATORY_CARE_PROVIDER_SITE_OTHER): Payer: Medicare Other | Admitting: Neurology

## 2024-02-18 VITALS — BP 149/64 | HR 63 | Ht 62.0 in | Wt 141.0 lb

## 2024-02-18 DIAGNOSIS — Z923 Personal history of irradiation: Secondary | ICD-10-CM

## 2024-02-18 DIAGNOSIS — I480 Paroxysmal atrial fibrillation: Secondary | ICD-10-CM | POA: Diagnosis not present

## 2024-02-18 DIAGNOSIS — G620 Drug-induced polyneuropathy: Secondary | ICD-10-CM | POA: Diagnosis not present

## 2024-02-18 DIAGNOSIS — Z7189 Other specified counseling: Secondary | ICD-10-CM

## 2024-02-18 DIAGNOSIS — I255 Ischemic cardiomyopathy: Secondary | ICD-10-CM | POA: Diagnosis not present

## 2024-02-18 DIAGNOSIS — I739 Peripheral vascular disease, unspecified: Secondary | ICD-10-CM

## 2024-02-18 DIAGNOSIS — G2581 Restless legs syndrome: Secondary | ICD-10-CM | POA: Diagnosis not present

## 2024-02-18 DIAGNOSIS — G4739 Other sleep apnea: Secondary | ICD-10-CM | POA: Diagnosis not present

## 2024-02-18 MED ORDER — ROPINIROLE HCL 0.5 MG PO TABS: ORAL_TABLET | ORAL | 5 refills | Status: AC

## 2024-02-18 MED ORDER — CLONAZEPAM 0.5 MG PO TABS: 0.2500 mg | ORAL_TABLET | Freq: Every evening | ORAL | 5 refills | Status: DC | PRN

## 2024-02-18 NOTE — Progress Notes (Signed)
 Provider:  Melvyn Novas, MD  Primary Care Physician:  Holly Benson, Holly Patricia, MD [address] [place] [address]     Referring Provider: Caesar Bookman, Np [address] [place],  [place] [address]          Chief Complaint according to patient   Patient presents with:                HISTORY OF PRESENT ILLNESS:  Holly Benson is a 89 y.o. female patient who is here for revisit 02/18/2024 for RLS:  gets refills   has atrial fib,  occasional flutter sensation on Tikosyn.  This condition begot the sleep apnea, according Holly Holly Benson.    PAD,  starting some cramping at groin,   Aching hands and finger joints-  finger tips are numb and tingly.   Holly Benson - Holly Benson followed for spots on her lung, Holly Benson has seen her for a long time, now started  radiation.  .  Chief concern according to patient :  I wonder if I still need the AVS, as I feel I sleep better without  ASV. My sleep monitor is  indicating long sleep without ASV but I don't know if I need  to treat AHI.   Holly Holly Benson has done her sleep tests in 2020, I have never tested the patient.    We discussed a repeat sleep study without ASV, and the patient requested a HST.       2022;  She states she continues to have ongoing concerns with RLS symptoms. She also says for > 6 mths she has been having numbness.tingling in bilateral thumb,pointer and middle fingers.  PS : Pt also wanted to know if Holly Holly Benson would be willing to take on her ASV- she is not getting proper f/u care with her machine ( prescribed by Holly Holly Benson, Cardiologist) and would really like to address this here. She is set up with a machine through [facility] and they don't help with mask refits. Her last SS was in 2020 Holly Holly Benson. The studies were never read- EPIC NOTE still states  Holly Holly Benson to read _ ASV results unknown. Referred by Holly Benson who follows for atrial fib. Holly Benson started amiodarone, but after 2-3 years she had to  stop it. Amiodarone induced myopathy/ neuropathy.  She feels the mask is not fitting he well at all. She wants to change providers for ASV-   08-15-2021: Holly Benson has felt that it was 4 times daily ropinirole and up to 4 times daily gabapentin she was able to get by with little clonazepam intake -maybe 50 tablets a year.   However nothing has come trolled her restless leg syndrome as well as clonazepam.  There is also the feeling that she still has medication induced sleepiness. She feels RLS were worse when she is seated and awake rather than in bed.  She does not suddenly fall asleep but she does feel fatigued and sleepy her on medication.  Her last 3 visit in his office were actually done with nurse practitioner Holly Benson on 12/18/2020 on 3 6 and on 04-23-21.   I will follow this patient after September 2023 for central apnea if she likes me to.    Review of Systems: Out of a complete 14 system review, the patient complains of only the following symptoms, and all other reviewed systems are negative.:   ESS: 7/ 24  Does not take a lot of naps. FSS at 46/ 63 points.   Social History   Socioeconomic History   Marital status: Widowed    Spouse name: Not on file   Number of children: 3   Years of education: Not on file   Highest education level: Master's degree (e.g., MA, MS, MEng, MEd, MSW, MBA)  Occupational History   Occupation: retired    Comment: retired Comptroller  Tobacco Use   Smoking status: Former    Current packs/day: 0.00    Average packs/day: 1.5 packs/day for 37.0 years (55.5 ttl pk-yrs)    Types: Cigarettes    Start date: 31    Quit date: 11/04/1991    Years since quitting: 32.3   Smokeless tobacco: Never   Tobacco comments:    Former smoker 03/14/22  Vaping Use   Vaping status: Never Used  Substance and Sexual Activity   Alcohol use: Yes    Alcohol/week: 7.0 standard drinks of alcohol    Types: 7 Glasses of wine per week    Comment: 1 glass of wine nightly  03/14/22   Drug use: No   Sexual activity: Not Currently    Birth control/protection: Surgical    Comment: Hyst, First IC >16y/o, Partners <5, No hx of STIs, DES-neg  Other Topics Concern   Not on file  Social History Narrative   Lives with daughter   Social Drivers of Health   Financial Resource Strain: Low Risk  (11/16/2023)   Overall Financial Resource Strain (CARDIA)    Difficulty of Paying Living Expenses: Not hard at all  Food Insecurity: No Food Insecurity (12/07/2023)   Hunger Vital Sign    Worried About Running Out of Food in the Last Year: Never true    Ran Out of Food in the Last Year: Never true  Transportation Needs: No Transportation Needs (12/07/2023)   PRAPARE - Administrator, Civil Service (Medical): No    Lack of Transportation (Non-Medical): No  Physical Activity: Insufficiently Active (11/16/2023)   Exercise Vital Sign    Days of Exercise per Week: 4 days    Minutes of Exercise per Session: 30 min  Stress: No Stress Concern Present (11/16/2023)   [employer] of Occupational Health - Occupational Stress Questionnaire    Feeling of Stress : Not at all  Social Connections: Moderately Integrated (12/07/2023)   Social Connection and Isolation Panel [NHANES]    Frequency of Communication with Friends and Family: Three times a week    Frequency of Social Gatherings with Friends and Family: Three times a week    Attends Religious Services: 1 to 4 times per year    Active Member of Clubs or Organizations: Yes    Attends Banker Meetings: 1 to 4 times per year    Marital Status: Widowed    Family History  Problem Relation Age of Onset   Suicidality Father    Stroke Maternal Grandfather    Hypertension Sister    Colon cancer Neg Hx    Esophageal cancer Neg Hx    Pancreatic cancer Neg Hx    Stomach cancer Neg Hx    Liver disease Neg Hx     Past Medical History:  Diagnosis Date   Arrhythmia    Arthritis    Atrial fibrillation (HCC)     Constipation    chronic   COPD (chronic obstructive pulmonary disease) (HCC)    History of radiation therapy    Right lung- 09/22/23-09/29/23- Holly.  Antony Blackbird   Hyperlipidemia    Hypertension    pulmonary   OSA on CPAP    Osteoarthritis    Osteoporosis    Peripheral neuropathy    Pulmonary hypertension (HCC)    RLS (restless legs syndrome)    Sleep apnea     Past Surgical History:  Procedure Laterality Date   ABDOMINAL AORTOGRAM W/LOWER EXTREMITY N/A 03/21/2020   Procedure: ABDOMINAL AORTOGRAM W/ Bilateral LOWER EXTREMITY Run[place];  Surgeon: Iran Ouch, MD;  Location: [facility];  Service: Cardiovascular;  Laterality: N/A;   ABDOMINAL HYSTERECTOMY Bilateral    arm surgery Right    Broken arm and has a plate in it   CARDIOVERSION N/A 03/12/2023   Procedure: CARDIOVERSION;  Surgeon: Quintella Reichert, MD;  Location: [facility];  Service: Cardiovascular;  Laterality: N/A;   CATARACT EXTRACTION Bilateral    COLONOSCOPY     More than 10 years ago In [facility]   HERNIA REPAIR  08/2022   KNEE ARTHROSCOPY Right    PERIPHERAL VASCULAR INTERVENTION Bilateral 03/21/2020   Procedure: PERIPHERAL VASCULAR INTERVENTION;  Surgeon: Iran Ouch, MD;  Location: [facility];  Service: Cardiovascular;  Laterality: Bilateral;  external iliac   TONSILLECTOMY     ventral herniorrhaphy  2023   mesh used     Current Outpatient Medications on File Prior to Visit  Medication Sig Dispense Refill   acetaminophen (TYLENOL) 325 MG tablet Take 325-650 mg by mouth every 6 (six) hours as needed for mild pain (pain score 1-3), headache or fever.     albuterol (PROVENTIL) (2.5 MG/3ML) 0.083% nebulizer solution Take 3 mLs (2.5 mg total) by nebulization every 6 (six) hours as needed for wheezing or shortness of breath. 75 mL 5   albuterol (VENTOLIN HFA) 108 (90 Base) MCG/ACT inhaler INHALE TWO PUFFS BY MOUTH EVERY 6 HOURS AS NEEDED FOR WHEEZING OR FOR SHORTNESS OF BREATH  (Patient taking differently: Inhale 2 puffs into the lungs every 6 (six) hours as needed for wheezing or shortness of breath.) 18 g 6   amLODipine (NORVASC) 5 MG tablet Take 1 tablet (5 mg total) by mouth daily. 30 tablet 7   apixaban (ELIQUIS) 5 MG TABS tablet Take 1 tablet (5 mg total) by mouth 2 (two) times daily. 180 tablet 1   bisoprolol (ZEBETA) 10 MG tablet TAKE 1 TABLET BY MOUTH AT BEDTIME (Patient taking differently: Take 10 mg by mouth daily.) 90 tablet 3   Cholecalciferol (VITAMIN D3 MAXIMUM STRENGTH) 125 MCG (5000 UT) capsule Take 5,000 Units by mouth daily.     clonazePAM (KLONOPIN) 0.5 MG tablet Take 0.5 tablets (0.25 mg total) by mouth at bedtime as needed for anxiety. (Patient taking differently: Take 0.25 mg by mouth at bedtime as needed (for restless legs).) 30 tablet 2   docusate sodium (COLACE) 100 MG capsule Take 300 mg by mouth daily as needed for moderate constipation.     dofetilide (TIKOSYN) 250 MCG capsule TAKE 1 CAPSULE BY MOUTH 2 TIMES A DAY. 180 capsule 2   fluticasone (FLONASE) 50 MCG/ACT nasal spray Place 2 sprays into both nostrils daily. 18.2 mL 2   furosemide (LASIX) 20 MG tablet Take 1 tablet by mouth on Monday, Wednesday and Friday. Take 2 tablets by mouth on all other days (Patient taking differently: Take 20-40 mg by mouth See admin instructions. Take 20 mg by mouth in the morning on Sun/Tues/Thurs/Sat and 40 mg on Mon/Wed/Fri) 132 tablet 3   irbesartan (  AVAPRO) 300 MG tablet TAKE 1 TABLET BY MOUTH DAILY 90 tablet 1   MAGNESIUM OXIDE PO Take 1 tablet by mouth daily.     Misc Natural Products (COLON CLEANSE) CAPS Take 4 capsules by mouth daily as needed (constipation).     Multiple Vitamins-Minerals (PRESERVISION AREDS 2) CAPS Take 1 capsule by mouth 2 (two) times daily.     polyethylene glycol (MIRALAX / GLYCOLAX) 17 g packet Take 17 g by mouth daily as needed for moderate constipation (MIX AS DIRECTED).     rOPINIRole (REQUIP) 0.5 MG tablet TAKE 1 TABLET BY MOUTH  EVERY MORNING AND TAKE 1 TABLET BY MOUTH AFTER 3PM AND TAKE 1 TABLET AFTER DINNER 8PM 90 tablet 5   STIOLTO RESPIMAT 2.5-2.5 MCG/ACT AERS Inhale 2 puffs into the lungs in the morning.     rosuvastatin (CRESTOR) 20 MG tablet Take 1 tablet (20 mg total) by mouth daily. 90 tablet 3   Current Facility-Administered Medications on File Prior to Visit  Medication Dose Route Frequency Provider Last Rate Last Admin   technetium tetrofosmin (TC-MYOVIEW) injection 31.9 millicurie  31.9 millicurie Intravenous Once PRN Maudine Sos, MD        Allergies  Allergen Reactions   Lovenox [Enoxaparin Sodium] Hives, Rash and Other (See Comments)    PT STATES SHE BROKE OUT IN A RASH HEAD TO TOE AND LASTED ABOUT 3 WEEKS    Metoprolol Succinate [Metoprolol] Other (See Comments)    Muscle aches, hand pain, and tingling     DIAGNOSTIC DATA (LABS, IMAGING, TESTING) - I reviewed patient records, labs, notes, testing and imaging myself where available.  Lab Results  Component Value Date   WBC 2.8 (L) 12/08/2023   HGB 11.8 (L) 12/08/2023   HCT 36.1 12/08/2023   MCV 100.6 (H) 12/08/2023   PLT 165 12/08/2023      Component Value Date/Time   NA 134 (L) 12/08/2023 0438   NA 140 09/03/2023 1151   K 4.1 12/08/2023 0438   CL 102 12/08/2023 0438   CO2 22 12/08/2023 0438   GLUCOSE 148 (H) 12/08/2023 0438   BUN 21 12/08/2023 0438   BUN 22 09/03/2023 1151   CREATININE 0.79 12/08/2023 0438   CREATININE 0.81 05/26/2022 1046   CALCIUM 8.1 (L) 12/08/2023 0438   PROT 6.5 01/20/2024 0821   ALBUMIN 4.1 01/20/2024 0821   AST 19 01/20/2024 0821   ALT 19 01/20/2024 0821   ALKPHOS 130 (H) 01/20/2024 0821   BILITOT 0.7 01/20/2024 0821   GFRNONAA >60 12/08/2023 0438   GFRAA 59 (L) 08/01/2020 1549   Lab Results  Component Value Date   CHOL 183 01/20/2024   HDL 75 01/20/2024   LDLCALC 94 01/20/2024   TRIG 78 01/20/2024   CHOLHDL 2.4 01/20/2024   No results found for: "HGBA1C" No results found for:  "VITAMINB12" Lab Results  Component Value Date   TSH 2.09 05/26/2022    PHYSICAL EXAM:  Today's Vitals   02/18/24 1547  BP: (!) 149/64  Pulse: 63  Weight: 141 lb (64 kg)  Height: 5\' 2"  (1.575 m)   Body mass index is 25.79 kg/m.   Wt Readings from Last 3 Encounters:  02/18/24 141 lb (64 kg)  02/10/24 141 lb (64 kg)  02/02/24 142 lb (64.4 kg)     Ht Readings from Last 3 Encounters:  02/18/24 5\' 2"  (1.575 m)  02/10/24 5\' 2"  (1.575 m)  02/02/24 5\' 2"  (1.575 m)      General: The patient is awake,  alert and appears not in acute distress. The patient is well groomed. Head: Normocephalic, atraumatic. General: The patient is awake, alert and appears not in acute distress. The patient is well groomed. Head: Normocephalic, atraumatic. Neck is supple. Mallampati 1,  neck circumference:13 inches . Nasal airflow patent.  Retrognathia is seen.  Dental status: intact , small oral opening  Cardiovascular: irregular rate and cardiac rhythm by pulse,  without distended neck veins. Respiratory: Lungs are clear to auscultation.  Skin:  With evidence of ankle edema. . Trunk: The patient's posture is erect.   Neurologic exam : The patient is awake and alert, oriented to place and time.   Memory subjective described as intact.  Attention span & concentration ability appears normal.  Speech is fluent,  with dysphonia  Mood and affect are appropriate.   Cranial nerves: no loss of smell or taste reported  Pupils are equal and briskly reactive to light. Funduscopic exam deferred.   Extraocular movements in vertical and horizontal planes were intact and without nystagmus. No Diplopia. Visual fields by finger perimetry are intact. Hearing was intact to soft voice and finger rubbing.    Facial sensation intact to fine touch.  Facial motor strength is symmetric and tongue and uvula move midline.  Neck ROM : rotation, tilt and flexion extension were normal for age and shoulder shrug was  symmetrical.    Motor exam:  Symmetric bulk, tone and ROM.  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    Sensory:  Fine touch, pinprick and vibration were tested and perceived as diminished over the left knee and ankles.  PAD . Decreased sensation in both feet to fine touch.  Hands feel always cold.  Proprioception tested in the upper extremities was normal. Carpal tunnel bilaterally.    Coordination: Rapid alternating movements in the fingers/hands were of normal speed.  The Finger-to-nose maneuver was intact without evidence of ataxia or tremor.  There was dysmetria on the left.    Gait and station: Patient could rise unassisted from a seated position, walked sometimes with a cane " for balance" as assistive device.  Deep tendon reflexes: in the upper and lower extremities are symmetric and intact.      ASSESSMENT AND PLAN 89 y.o. year old female  here with: central apnea ???   evisit 02/18/2024 for RLS:  gets refills   has atrial fib,  occasional flutter sensation on Tikosyn.  This condition begot the sleep apnea, according Holly Loetta Ringer.    PAD,  starting some cramping at groin,   Aching hands and finger joints-  finger tips are numb and tingly.   Holly Eloise Hake - Holly Maximo Spar followed for spots on her lung, Holly Maximo Spar has seen her for a long time, now started  radiation.  .  Chief concern according to patient :  I wonder if I still need the AVS, as I feel I sleep better without  ASV. My sleep monitor is  indicating long sleep without ASV but I don't know if I need  to treat AHI.   Holly Loetta Ringer has done her sleep tests in 2020, I have never tested the patient.    We discussed a repeat sleep study without ASV, and the patient requested a HST.     HST ordered- [facility] or watch pat.   I plan to follow up either personally or through our NP  within 6 months.   I would  like to thank Zilphia Hilt, Charyl Coppersmith, MD and Ngetich, Elijio Guadeloupe, Np [address] [place],  [place] [address] for allowing me to meet with and to take care of this pleasant patient.     After spending a total time of  34  minutes face to face and additional time for physical and neurologic examination, review of laboratory studies,  personal review of imaging studies, reports and results of other testing and review of referral information / records as far as provided in visit,   Electronically signed by: Neomia Banner, MD 02/18/2024 4:18 PM  [facility] and [facility] Board certified by The [employer] of Sleep Medicine and Diplomate of the [employer] of Sleep Medicine. Board certified In Neurology through the ABPN, Fellow of the [employer] of Neurology.

## 2024-02-18 NOTE — Patient Instructions (Addendum)
 89 y.o. year old female  here with: central apnea ???    evisit 02/18/2024 for RLS:  gets refills    has atrial fib,  occasional flutter sensation on Tikosyn.  This condition begot the sleep apnea, according Dr Loetta Ringer.    PAD,  starting some cramping at groin,    Aching hands and finger joints-  finger tips are numb and tingly.    Dr Eloise Hake - Dr Maximo Spar followed for spots on her lung, Dr Maximo Spar has seen her for a long time, now started  radiation.  This is a patient with low BMI and coughing,  .  Chief concern according to patient :  I wonder if I still need the AVS, as I feel I sleep better without  ASV. My sleep monitor is  indicating long sleep without ASV but I don't know if I need  to treat AHI.    Dr Loetta Ringer has done her sleep tests in 2020, I have never tested the patient.      We discussed a repeat sleep study without ASV, and the patient requested a HST.      HST ordered- watch pat .

## 2024-02-25 DIAGNOSIS — C44329 Squamous cell carcinoma of skin of other parts of face: Secondary | ICD-10-CM | POA: Diagnosis not present

## 2024-03-08 DIAGNOSIS — Z85828 Personal history of other malignant neoplasm of skin: Secondary | ICD-10-CM | POA: Diagnosis not present

## 2024-03-08 DIAGNOSIS — C44329 Squamous cell carcinoma of skin of other parts of face: Secondary | ICD-10-CM | POA: Diagnosis not present

## 2024-03-17 ENCOUNTER — Other Ambulatory Visit (HOSPITAL_BASED_OUTPATIENT_CLINIC_OR_DEPARTMENT_OTHER): Payer: Self-pay

## 2024-03-17 ENCOUNTER — Encounter: Payer: Self-pay | Admitting: Emergency Medicine

## 2024-03-17 DIAGNOSIS — J4489 Other specified chronic obstructive pulmonary disease: Secondary | ICD-10-CM

## 2024-03-17 MED ORDER — ALBUTEROL SULFATE (2.5 MG/3ML) 0.083% IN NEBU: 2.5000 mg | INHALATION_SOLUTION | Freq: Four times a day (QID) | RESPIRATORY_TRACT | 5 refills | Status: DC | PRN

## 2024-03-18 ENCOUNTER — Telehealth: Payer: Self-pay

## 2024-03-18 NOTE — Telephone Encounter (Signed)
*  Pulm  Pharmacy Patient Advocate Encounter   Received notification from CoverMyMeds that prior authorization for Albuterol  Sulfate (2.5 MG/3ML)0.083% nebulizer solution  is required/requested.   Insurance verification completed.   The patient is insured through [facility] .   Per test claim: PA required; PA submitted to above mentioned insurance via CoverMyMeds Key/confirmation #/EOC [id number] Status is pending

## 2024-03-21 NOTE — Telephone Encounter (Signed)
 Covered under Medicare Part B. Letter attached in patients media.

## 2024-03-22 ENCOUNTER — Ambulatory Visit (INDEPENDENT_AMBULATORY_CARE_PROVIDER_SITE_OTHER): Admitting: Neurology

## 2024-03-22 DIAGNOSIS — I480 Paroxysmal atrial fibrillation: Secondary | ICD-10-CM

## 2024-03-22 DIAGNOSIS — G4734 Idiopathic sleep related nonobstructive alveolar hypoventilation: Secondary | ICD-10-CM

## 2024-03-22 DIAGNOSIS — G4733 Obstructive sleep apnea (adult) (pediatric): Secondary | ICD-10-CM | POA: Diagnosis not present

## 2024-03-22 DIAGNOSIS — G4739 Other sleep apnea: Secondary | ICD-10-CM

## 2024-03-22 DIAGNOSIS — G620 Drug-induced polyneuropathy: Secondary | ICD-10-CM

## 2024-03-22 DIAGNOSIS — G2581 Restless legs syndrome: Secondary | ICD-10-CM

## 2024-03-22 DIAGNOSIS — I739 Peripheral vascular disease, unspecified: Secondary | ICD-10-CM

## 2024-03-23 NOTE — Progress Notes (Unsigned)
 Holly Benson

## 2024-03-28 ENCOUNTER — Ambulatory Visit: Payer: Self-pay | Admitting: Neurology

## 2024-03-28 DIAGNOSIS — G4734 Idiopathic sleep related nonobstructive alveolar hypoventilation: Secondary | ICD-10-CM | POA: Insufficient documentation

## 2024-03-28 NOTE — Procedures (Signed)
 [facility] [employer] [place] 89 year old female [birth year] HOME SLEEP TEST REPORT ( by Watch PAT)   STUDY DATE:  03-22-2024   ORDERING CLINICIAN:  Neomia Banner, MD  REFERRING CLINICIAN:  Melonie Square, MD    CLINICAL INFORMATION/HISTORY:Holly Benson is a 89 y.o. female patient who is here for revisit 02/18/2024 for RLS:  gets refills.  89 year -old patient with known cardiomyopathy, shortness of breath ,COPD and PAD on Eloquis, also atrial fibrillation, amiodarone  induced neuropathy. She reports she sleeps better without her AVS machine ( she was never before sleep tested here, Dr Loetta Ringer and Dr Baldwin Levee have been treating her) and would like to know if she still needs an ASV machine.  The current ASV therapy was initiated by Dr. Loetta Ringer after a sleep study in 2020.   Epworth sleepiness score: 7/ 24  Does not take a lot of naps. FSS at 46/ 63 points.    BMI: 25.8  kg/m   Neck Circumference: 13"   FINDINGS:   Sleep Summary:   Total Recording Time (hours, min): 7 hours and 35 minutes      Total Sleep Time (hours, min): 7 hours 4 minutes               Percent REM (%):   23%                                     Respiratory Indices:   Calculated pAHI by CMS guideline:     33.8/h                    REM pAHI:   48/h                                              NREM pAHI:    30/h                          Positional AHI:     The patient slept mostly in supine and here her AHI was 30.7/h she slept briefly on her left side for 20 minutes and the AHI exacerbated to 60.6/h.     Snoring reached a mean volume of 41 dB which is not very loud it is actually just at the threshold of detection.                                            Oxygen  Saturation Statistics:   Oxygen  Saturation (%) Mean: 88%              O2 Saturation Range (%):     Between a nadir at 80 and a maximum of 97%                                  O2 Saturation (minutes) <89%:      According to this home sleep test device 244.6 minutes almost 60% of sleep time was spent in hypoxia.        Pulse Rate Statistics:   Pulse  Mean (bpm): 60 bpm               Pulse Range: Between 52 and 83 bpm.  This Home sleep Test device cannot detect cardiac rhythm data.               IMPRESSION:  This HST confirms the presence of presumably purely obstructive sleep apnea as a severe degree.  No central events were detected, REM and non-REM sleep apnea and the disease were fairly similar.  Supine sleep did not accentuate the apnea.  There were significant findings of severe hypoxemia for over half of the nocturnal sleep time.     RECOMMENDATION: Based on this home sleep test the patient would not re- qualify for an ASV because she did not have central apnea.  Home sleep Test devices however are not good at detecting central apnea and they  do not detect Cheyne-Stokes respirations very well either.  What I can tell Holly Benson is that she still has severe apnea and that she needs some form of positive airway pressure to overcome the associated hypoxia and apnea.  I would like for her to consider the following steps:  #1 she returns to her sleep medicine cardiologist, Dr. Loetta Ringer, as he may have more information about her comorbidities that could qualify her for other therapies.   #2 she could also discuss with pulmonologist Dr. Baldwin Levee if she is qualified for oxygen  alone, a therapy I cannot prescribe without CPAP or BiPAP. Her degree of hypoxia is likely not alone related to apnea but to a primary pulmonary pathology.   #3  she returns to [place] [facility] at [facility] for an in-lab study during which we can try CPAP and BiPAP, and will may be able to titrate oxygen . It is also possible that we then see a different breathing pattern that is cyclic or central in origin.       INTERPRETING PHYSICIAN:   Neomia Banner, MD  [facility] Sleep Board certified by The  [employer] of Sleep Medicine and Diplomate of the [employer] of Sleep Medicine. Board certified In Neurology through the ABPN, Fellow of the [employer] of Neurology.

## 2024-03-29 ENCOUNTER — Ambulatory Visit: Attending: Cardiology | Admitting: Pharmacist

## 2024-03-29 DIAGNOSIS — I739 Peripheral vascular disease, unspecified: Secondary | ICD-10-CM | POA: Insufficient documentation

## 2024-03-29 DIAGNOSIS — E785 Hyperlipidemia, unspecified: Secondary | ICD-10-CM | POA: Insufficient documentation

## 2024-03-29 DIAGNOSIS — E782 Mixed hyperlipidemia: Secondary | ICD-10-CM | POA: Insufficient documentation

## 2024-03-29 NOTE — Assessment & Plan Note (Signed)
 Assessment: On last labs patient had just resumed rosuvastatin , therefore was not at steady state and full effect had not been seen Previously her LDL-C had been 68 Patient remains active, volunteering several days a week Very little to no red meat Reviewed options for lowering LDL cholesterol, including ezetimibe, PCSK-9 inhibitors, and inclisiran (FEP is secondary to medicare).  Discussed mechanisms of action, dosing, side effects and potential decreases in LDL cholesterol.  Also reviewed cost information and potential options for patient assistance.  Plan: Repeat labs in a few weeks when she comes back to see afib clinic If LDL-C is above goal, will re-discuss adding ezetimibe, Repatha or Leqvio

## 2024-03-29 NOTE — Patient Instructions (Addendum)
 Please go for repeat lab work in the next few weeks There is a lab on the 1st floor of this building ([address] It is open from 8-5 Please come fasting  I will call you with your results and we can discuss options

## 2024-03-29 NOTE — Progress Notes (Signed)
 Patient ID: Holly Benson                 DOB: 1933/01/10                    MRN: [id number]      HPI: Holly Benson is a 89 y.o. female patient referred to lipid clinic by Lawana Pray. PMH is significant for HTN, afib, COPD, OSA, PAD.  She was seen in 2021 for severe bilateral back and leg claudication.  Noninvasive vascular studies showed an ABI of 0.88 on the right and 0.69 on the left.  Duplex showed monophasic waveforms starting in the common femoral artery with no evidence of infrainguinal disease.  Aortoiliac duplex showed severe bilateral external iliac artery stenosis and significant left common iliac artery stenosis. Angiography was done in May 2021 which showed severe bilateral external iliac artery disease worse on the left side. A drug-eluting self-expanding stent placement to bilateral external iliac arteries was preformed. Cardiac CTA in November showed a calcium  score of 2000. There was evidence of moderate nonobstructive disease.   Previously stopped rosuvastatin  due to muscle aches that did not resolve upon stopping.   Patient presents today to lipid clinic. She states that she thinks she was on rosuvastatin  at the time of her last labs, but thinks it was not more than 1 week. She has been taking rosuvastatin  ever since.  Reviewed options for lowering LDL cholesterol, including ezetimibe, PCSK-9 inhibitors, and inclisiran.  Discussed mechanisms of action, dosing, side effects and potential decreases in LDL cholesterol.  Also reviewed cost information and potential options for patient assistance.   Current Medications: rosuvastatin  20mg  daily Intolerances: none Risk Factors: age, PAD, CAD, HTN LDL-C goal: <70 ApoB goal: <80  Diet:  Breakfast: egg a few times a week, cereal, fruit or yogurt Lunch: left overs, sandwich Dinner: chicken or take out Drink: black coffee, water Snack: cheese and crackers, veggies and hummus  Exercise: walks up and down stairs 5-6  times a day, previously was going to gym 3 times a week, volunteer work  Family History:  Family History  Problem Relation Age of Onset   Suicidality Father    Stroke Maternal Grandfather    Hypertension Sister    Colon cancer Neg Hx    Esophageal cancer Neg Hx    Pancreatic cancer Neg Hx    Stomach cancer Neg Hx    Liver disease Neg Hx      Social History: no tobacco, 1 glass of wine per day  Labs: Lipid Panel     Component Value Date/Time   CHOL 183 01/20/2024 0821   TRIG 78 01/20/2024 0821   HDL 75 01/20/2024 0821   CHOLHDL 2.4 01/20/2024 0821   CHOLHDL 2.7 05/26/2022 1046   LDLCALC 94 01/20/2024 0821   LDLCALC 125 (H) 05/26/2022 1046   LABVLDL 14 01/20/2024 0821    Past Medical History:  Diagnosis Date   Arrhythmia    Arthritis    Atrial fibrillation (HCC)    Constipation    chronic   COPD (chronic obstructive pulmonary disease) (HCC)    History of radiation therapy    Right lung- 09/22/23-09/29/23- Dr. Retta Caster   Hyperlipidemia    Hypertension    pulmonary   OSA on CPAP    Osteoarthritis    Osteoporosis    Peripheral neuropathy    Pulmonary hypertension (HCC)    RLS (restless legs syndrome)    Sleep apnea  Current Outpatient Medications on File Prior to Visit  Medication Sig Dispense Refill   acetaminophen  (TYLENOL ) 325 MG tablet Take 325-650 mg by mouth every 6 (six) hours as needed for mild pain (pain score 1-3), headache or fever.     albuterol  (PROVENTIL ) (2.5 MG/3ML) 0.083% nebulizer solution Take 3 mLs (2.5 mg total) by nebulization every 6 (six) hours as needed for wheezing or shortness of breath. 75 mL 5   albuterol  (VENTOLIN  HFA) 108 (90 Base) MCG/ACT inhaler INHALE TWO PUFFS BY MOUTH EVERY 6 HOURS AS NEEDED FOR WHEEZING OR FOR SHORTNESS OF BREATH (Patient taking differently: Inhale 2 puffs into the lungs every 6 (six) hours as needed for wheezing or shortness of breath.) 18 g 6   amLODipine  (NORVASC ) 5 MG tablet Take 1 tablet (5 mg  total) by mouth daily. 30 tablet 7   apixaban  (ELIQUIS ) 5 MG TABS tablet Take 1 tablet (5 mg total) by mouth 2 (two) times daily. 180 tablet 1   bisoprolol  (ZEBETA ) 10 MG tablet TAKE 1 TABLET BY MOUTH AT BEDTIME (Patient taking differently: Take 10 mg by mouth daily.) 90 tablet 3   Cholecalciferol (VITAMIN D3 MAXIMUM STRENGTH) 125 MCG (5000 UT) capsule Take 5,000 Units by mouth daily.     clonazePAM  (KLONOPIN ) 0.5 MG tablet Take 0.5 tablets (0.25 mg total) by mouth at bedtime as needed (for restless legs). 30 tablet 5   docusate sodium  (COLACE) 100 MG capsule Take 300 mg by mouth daily as needed for moderate constipation.     dofetilide  (TIKOSYN ) 250 MCG capsule TAKE 1 CAPSULE BY MOUTH 2 TIMES A DAY. 180 capsule 2   fluticasone  (FLONASE ) 50 MCG/ACT nasal spray Place 2 sprays into both nostrils daily. 18.2 mL 2   furosemide  (LASIX ) 20 MG tablet Take 1 tablet by mouth on Monday, Wednesday and Friday. Take 2 tablets by mouth on all other days (Patient taking differently: Take 20-40 mg by mouth See admin instructions. Take 20 mg by mouth in the morning on Sun/Tues/Thurs/Sat and 40 mg on Mon/Wed/Fri) 132 tablet 3   irbesartan  (AVAPRO ) 300 MG tablet TAKE 1 TABLET BY MOUTH DAILY 90 tablet 1   MAGNESIUM  OXIDE PO Take 1 tablet by mouth daily.     Misc Natural Products (COLON CLEANSE) CAPS Take 4 capsules by mouth daily as needed (constipation).     Multiple Vitamins-Minerals (PRESERVISION AREDS 2) CAPS Take 1 capsule by mouth 2 (two) times daily.     polyethylene glycol (MIRALAX  / GLYCOLAX ) 17 g packet Take 17 g by mouth daily as needed for moderate constipation (MIX AS DIRECTED).     rOPINIRole  (REQUIP ) 0.5 MG tablet TAKE 1 TABLET BY MOUTH EVERY MORNING AND TAKE 1 TABLET BY MOUTH AFTER 3PM AND TAKE 1 TABLET AFTER DINNER 8PM 90 tablet 5   rosuvastatin  (CRESTOR ) 20 MG tablet Take 1 tablet (20 mg total) by mouth daily. 90 tablet 3   STIOLTO RESPIMAT  2.5-2.5 MCG/ACT AERS Inhale 2 puffs into the lungs in the  morning.     Current Facility-Administered Medications on File Prior to Visit  Medication Dose Route Frequency Provider Last Rate Last Admin   technetium tetrofosmin  (TC-MYOVIEW ) injection 31.9 millicurie  31.9 millicurie Intravenous Once PRN Maudine Sos, MD        Allergies  Allergen Reactions   Lovenox [Enoxaparin Sodium] Hives, Rash and Other (See Comments)    PT STATES SHE BROKE OUT IN A RASH HEAD TO TOE AND LASTED ABOUT 3 WEEKS    Metoprolol  Succinate [Metoprolol ] Other (  See Comments)    Muscle aches, hand pain, and tingling    Assessment/Plan:  1. Hyperlipidemia -  Mixed hyperlipidemia Assessment: On last labs patient had just resumed rosuvastatin , therefore was not at steady state and full effect had not been seen Previously her LDL-C had been 68 Patient remains active, volunteering several days a week Very little to no red meat Reviewed options for lowering LDL cholesterol, including ezetimibe, PCSK-9 inhibitors, and inclisiran (FEP is secondary to medicare).  Discussed mechanisms of action, dosing, side effects and potential decreases in LDL cholesterol.  Also reviewed cost information and potential options for patient assistance.  Plan: Repeat labs in a few weeks when she comes back to see afib clinic If LDL-C is above goal, will re-discuss adding ezetimibe, Repatha or Leqvio    Thank you,  Cecilie Heidel D Kanyla Omeara, Pharm.Monika Annas, CPP [facility] of  [facility] [address] [place], [place] [address]  Phone: [phone]; Fax: [phone]

## 2024-03-30 ENCOUNTER — Other Ambulatory Visit (HOSPITAL_COMMUNITY): Payer: Self-pay | Admitting: Physician Assistant

## 2024-03-30 NOTE — Telephone Encounter (Signed)
 This is a A-Fib clinic pt that is still being seen in the clinic

## 2024-03-31 ENCOUNTER — Encounter: Payer: Self-pay | Admitting: Neurology

## 2024-03-31 DIAGNOSIS — G2581 Restless legs syndrome: Secondary | ICD-10-CM

## 2024-03-31 DIAGNOSIS — I739 Peripheral vascular disease, unspecified: Secondary | ICD-10-CM

## 2024-03-31 DIAGNOSIS — Z7189 Other specified counseling: Secondary | ICD-10-CM

## 2024-03-31 DIAGNOSIS — G4739 Other sleep apnea: Secondary | ICD-10-CM

## 2024-03-31 DIAGNOSIS — I480 Paroxysmal atrial fibrillation: Secondary | ICD-10-CM

## 2024-04-10 ENCOUNTER — Encounter: Payer: Self-pay | Admitting: Internal Medicine

## 2024-04-12 ENCOUNTER — Ambulatory Visit (HOSPITAL_COMMUNITY)
Admission: RE | Admit: 2024-04-12 | Discharge: 2024-04-12 | Disposition: A | Discharge status: Home / Self Care | Source: Ambulatory Visit | Attending: Physician Assistant | Admitting: Physician Assistant

## 2024-04-12 ENCOUNTER — Encounter (HOSPITAL_COMMUNITY): Payer: Self-pay | Admitting: Physician Assistant

## 2024-04-12 VITALS — BP 150/86 | HR 59 | Ht 62.0 in | Wt 142.8 lb

## 2024-04-12 DIAGNOSIS — E785 Hyperlipidemia, unspecified: Secondary | ICD-10-CM | POA: Diagnosis not present

## 2024-04-12 DIAGNOSIS — I2510 Atherosclerotic heart disease of native coronary artery without angina pectoris: Secondary | ICD-10-CM | POA: Insufficient documentation

## 2024-04-12 DIAGNOSIS — D6869 Other thrombophilia: Secondary | ICD-10-CM | POA: Diagnosis not present

## 2024-04-12 DIAGNOSIS — I480 Paroxysmal atrial fibrillation: Secondary | ICD-10-CM | POA: Diagnosis not present

## 2024-04-12 DIAGNOSIS — I4892 Unspecified atrial flutter: Secondary | ICD-10-CM | POA: Diagnosis not present

## 2024-04-12 DIAGNOSIS — I739 Peripheral vascular disease, unspecified: Secondary | ICD-10-CM | POA: Diagnosis not present

## 2024-04-12 DIAGNOSIS — I5032 Chronic diastolic (congestive) heart failure: Secondary | ICD-10-CM | POA: Diagnosis not present

## 2024-04-12 DIAGNOSIS — I4819 Other persistent atrial fibrillation: Secondary | ICD-10-CM | POA: Diagnosis not present

## 2024-04-12 DIAGNOSIS — I110 Hypertensive heart disease with heart failure: Secondary | ICD-10-CM | POA: Insufficient documentation

## 2024-04-12 DIAGNOSIS — Z7901 Long term (current) use of anticoagulants: Secondary | ICD-10-CM | POA: Insufficient documentation

## 2024-04-12 DIAGNOSIS — I083 Combined rheumatic disorders of mitral, aortic and tricuspid valves: Secondary | ICD-10-CM | POA: Insufficient documentation

## 2024-04-12 DIAGNOSIS — J449 Chronic obstructive pulmonary disease, unspecified: Secondary | ICD-10-CM | POA: Insufficient documentation

## 2024-04-12 DIAGNOSIS — G4733 Obstructive sleep apnea (adult) (pediatric): Secondary | ICD-10-CM | POA: Diagnosis not present

## 2024-04-12 DIAGNOSIS — Z79899 Other long term (current) drug therapy: Secondary | ICD-10-CM | POA: Diagnosis not present

## 2024-04-12 DIAGNOSIS — Z5181 Encounter for therapeutic drug level monitoring: Secondary | ICD-10-CM | POA: Diagnosis not present

## 2024-04-12 NOTE — Progress Notes (Signed)
 Primary Care Physician: Zilphia Hilt, Charyl Coppersmith, MD Primary Cardiologist: Dr Maximo Spar Primary Electrophysiologist: Dr Lawana Pray Referring Physician: Dr Wauneta Haddock Holly Benson is a 89 y.o. female with a history of HTN, atrial flutter, HLD, OSA, COPD, PAD, and atrial fibrillation who presents for follow up in the [facility]. She was previously on amiodarone  but this was discontinued due to concern about possible lung toxicity. S/p dofetilide  admission 4/18-4/21/23. She was found to be in atrial flutter at her visit on 02/19/23 and was scheduled for DCCV on 5/9 but she arrived in SR and the procedure was cancelled. Patient is on Eliquis  for stroke prevention.   Patient returns for follow up for atrial fibrillation and dofetilide  monitoring. Patient reports that she has done well from an afib standpoint since her last visit. She has very rare palpitations lasting only a few seconds. No bleeding issues on anticoagulation.   Today, she  denies symptoms of palpitations, chest pain, shortness of breath, orthopnea, PND, lower extremity edema, dizziness, presyncope, syncope, bleeding, or neurologic sequela. The patient is tolerating medications without difficulties and is otherwise without complaint today.    Atrial Fibrillation Risk Factors:  she does have symptoms or diagnosis of sleep apnea. she does not have a history of rheumatic fever.   Atrial Fibrillation Management history:  Previous antiarrhythmic drugs: flecainide , amiodarone , dofetilide    Previous cardioversions: none Previous ablations: none Anticoagulation history: Eliquis    Past Medical History:  Diagnosis Date   Arrhythmia    Arthritis    Atrial fibrillation (HCC)    Constipation    chronic   COPD (chronic obstructive pulmonary disease) (HCC)    History of radiation therapy    Right lung- 09/22/23-09/29/23- Dr. Retta Caster   Hyperlipidemia    Hypertension    pulmonary   OSA on CPAP     Osteoarthritis    Osteoporosis    Peripheral neuropathy    Pulmonary hypertension (HCC)    RLS (restless legs syndrome)    Sleep apnea     Current Outpatient Medications  Medication Sig Dispense Refill   acetaminophen  (TYLENOL ) 325 MG tablet Take 325-650 mg by mouth every 6 (six) hours as needed for mild pain (pain score 1-3), headache or fever.     albuterol  (PROVENTIL ) (2.5 MG/3ML) 0.083% nebulizer solution Take 3 mLs (2.5 mg total) by nebulization every 6 (six) hours as needed for wheezing or shortness of breath. 75 mL 5   albuterol  (VENTOLIN  HFA) 108 (90 Base) MCG/ACT inhaler INHALE TWO PUFFS BY MOUTH EVERY 6 HOURS AS NEEDED FOR WHEEZING OR FOR SHORTNESS OF BREATH (Patient taking differently: Inhale 2 puffs into the lungs every 6 (six) hours as needed for wheezing or shortness of breath.) 18 g 6   apixaban  (ELIQUIS ) 5 MG TABS tablet Take 1 tablet (5 mg total) by mouth 2 (two) times daily. 180 tablet 1   bisoprolol  (ZEBETA ) 10 MG tablet TAKE 1 TABLET BY MOUTH AT BEDTIME 90 tablet 3   Cholecalciferol (VITAMIN D3 MAXIMUM STRENGTH) 125 MCG (5000 UT) capsule Take 5,000 Units by mouth daily.     clonazePAM  (KLONOPIN ) 0.5 MG tablet Take 0.5 tablets (0.25 mg total) by mouth at bedtime as needed (for restless legs). 30 tablet 5   docusate sodium  (COLACE) 100 MG capsule Take 300 mg by mouth daily as needed for moderate constipation.     dofetilide  (TIKOSYN ) 250 MCG capsule TAKE 1 CAPSULE BY MOUTH 2 TIMES A DAY. 180 capsule 2   furosemide  (  LASIX ) 20 MG tablet Take 1 tablet by mouth on Monday, Wednesday and Friday. Take 2 tablets by mouth on all other days (Patient taking differently: Take 20-40 mg by mouth See admin instructions. Take 20 mg by mouth in the morning on Sun/Tues/Thurs/Sat and 40 mg on Mon/Wed/Fri) 132 tablet 3   irbesartan  (AVAPRO ) 300 MG tablet TAKE 1 TABLET BY MOUTH DAILY 90 tablet 1   MAGNESIUM  OXIDE PO Take 1 tablet by mouth daily.     Misc Natural Products (COLON CLEANSE) CAPS  Take 4 capsules by mouth daily as needed (constipation).     Multiple Vitamins-Minerals (PRESERVISION AREDS 2) CAPS Take 1 capsule by mouth 2 (two) times daily.     polyethylene glycol (MIRALAX  / GLYCOLAX ) 17 g packet Take 17 g by mouth daily as needed for moderate constipation (MIX AS DIRECTED).     rOPINIRole  (REQUIP ) 0.5 MG tablet TAKE 1 TABLET BY MOUTH EVERY MORNING AND TAKE 1 TABLET BY MOUTH AFTER 3PM AND TAKE 1 TABLET AFTER DINNER 8PM 90 tablet 5   STIOLTO RESPIMAT  2.5-2.5 MCG/ACT AERS Inhale 2 puffs into the lungs in the morning.     amLODipine  (NORVASC ) 5 MG tablet Take 1 tablet (5 mg total) by mouth daily. (Patient not taking: Reported on 04/12/2024) 30 tablet 7   rosuvastatin  (CRESTOR ) 20 MG tablet Take 1 tablet (20 mg total) by mouth daily. 90 tablet 3   No current facility-administered medications for this encounter.   Facility-Administered Medications Ordered in Other Encounters  Medication Dose Route Frequency Provider Last Rate Last Admin   technetium tetrofosmin  (TC-MYOVIEW ) injection 31.9 millicurie  31.9 millicurie Intravenous Once PRN Maudine Sos, MD        ROS- All systems are reviewed and negative except as per the HPI above.  Physical Exam: Vitals:   04/12/24 0915  BP: (!) 150/86  Pulse: (!) 59  Weight: 64.8 kg  Height: 5\' 2"  (1.575 m)     GEN: Well nourished, well developed in no acute distress CARDIAC: Regular rate and rhythm, no murmurs, rubs, gallops RESPIRATORY:  Clear to auscultation without rales, wheezing or rhonchi  ABDOMEN: Soft, non-tender, non-distended EXTREMITIES:  No edema; No deformity    Wt Readings from Last 3 Encounters:  04/12/24 64.8 kg  02/18/24 64 kg  02/10/24 64 kg    EKG today demonstrates  SB Vent. rate 59 BPM PR interval 178 ms QRS duration 90 ms QT/QTcB 442/437 ms   Epic records are reviewed at length today   Echo 11/21/23  1. Left ventricular ejection fraction, by estimation, is 60 to 65%. The  left ventricle  has normal function. The left ventricle has no regional  wall motion abnormalities. Left ventricular diastolic parameters are  indeterminate.   2. Right ventricular systolic function is normal. The right ventricular  size is mildly enlarged. There is severely elevated pulmonary artery  systolic pressure.   3. Left atrial size was severely dilated.   4. Right atrial size was severely dilated.   5. The mitral valve is normal in structure. Mild mitral valve  regurgitation. No evidence of mitral stenosis.   6. Tricuspid valve regurgitation is moderate.   7. The aortic valve is tricuspid. Aortic valve regurgitation is mild. No  aortic stenosis is present.   8. The inferior vena cava is dilated in size with >50% respiratory  variability, suggesting right atrial pressure of 8 mmHg.    CHA2DS2-VASc Score = 6  The patient's score is based upon: CHF History: 1 HTN History:  1 Diabetes History: 0 Stroke History: 0 Vascular Disease History: 1 Age Score: 2 Gender Score: 1       ASSESSMENT AND PLAN: Persistent Atrial Fibrillation (ICD10:  I48.19) The patient's CHA2DS2-VASc score is 6, indicating a 9.7% annual risk of stroke.   Failed flecainide  and amiodarone  Loaded on dofetilide  02/2022 Patient appears to be maintaining SR Continue dofetilide  250 mcg BID Continue Eliquis  5 mg BID Continue bisoprolol  10 mg daily  Secondary Hypercoagulable State (ICD10:  D68.69) The patient is at significant risk for stroke/thromboembolism based upon her CHA2DS2-VASc Score of 6.  Continue Apixaban  (Eliquis ). No bleeding issues.   High Risk Medication Monitoring (ICD 10: Z79.899) QT interval on ECG acceptable for dofetilide  monitoring. Check bmet/mag today.     HTN Mildly elevated today but well controlled at home. She did have some low BP readings and stopped her amlodipine . No changes today.   OSA  Encouraged nightly CPAP  Chronic HFpEF EF 60-65% GDMT per primary cardiology team Fluid status  appears stable today  CAD CAC score 1999, FFR negative No anginal symptoms Followed by Dr Maximo Spar   Follow up in the AF clinic in 6 months.    Myrtha Ates PA-C Afib Clinic [facility] [address] [place], [place] [address] [phone]

## 2024-04-13 ENCOUNTER — Ambulatory Visit (HOSPITAL_COMMUNITY): Payer: Self-pay | Admitting: Physician Assistant

## 2024-04-13 LAB — BASIC METABOLIC PANEL WITH GFR
BUN/Creatinine Ratio: 27 (ref 12–28)
BUN: 25 mg/dL (ref 8–27)
CO2: 21 mmol/L (ref 20–29)
Calcium: 9.1 mg/dL (ref 8.7–10.3)
Chloride: 102 mmol/L (ref 96–106)
Creatinine, Ser: 0.92 mg/dL (ref 0.57–1.00)
Glucose: 91 mg/dL (ref 70–99)
Potassium: 4.7 mmol/L (ref 3.5–5.2)
Sodium: 143 mmol/L (ref 134–144)
eGFR: 60 mL/min/{1.73_m2} (ref >59)

## 2024-04-13 LAB — LIPID PANEL
Chol/HDL Ratio: 2.1 ratio (ref 0.0–4.4)
Cholesterol, Total: 159 mg/dL (ref 100–199)
HDL: 75 mg/dL (ref >39)
LDL Chol Calc (NIH): 71 mg/dL (ref 0–99)
Triglycerides: 64 mg/dL (ref 0–149)
VLDL Cholesterol Cal: 13 mg/dL (ref 5–40)

## 2024-04-13 LAB — MAGNESIUM: Magnesium: 2.3 mg/dL (ref 1.6–2.3)

## 2024-04-13 LAB — APOLIPOPROTEIN B: Apolipoprotein B: 60 mg/dL (ref <90)

## 2024-04-14 ENCOUNTER — Ambulatory Visit: Payer: Self-pay | Admitting: Pharmacist

## 2024-04-18 ENCOUNTER — Telehealth: Payer: Self-pay | Admitting: Neurology

## 2024-04-18 NOTE — Telephone Encounter (Signed)
 CPAP Medicare/BCBS fed no auth req bc Medicare is primary ref # [place] D on 04/11/24    Patient is scheduled at [facility] For 05/11/24 at 8 pm.  Mailed packet and sent mychart

## 2024-04-20 ENCOUNTER — Ambulatory Visit (HOSPITAL_COMMUNITY): Payer: Medicare Other | Admitting: Physician Assistant

## 2024-05-11 ENCOUNTER — Ambulatory Visit (INDEPENDENT_AMBULATORY_CARE_PROVIDER_SITE_OTHER): Admitting: Neurology

## 2024-05-11 DIAGNOSIS — I739 Peripheral vascular disease, unspecified: Secondary | ICD-10-CM

## 2024-05-11 DIAGNOSIS — J439 Emphysema, unspecified: Secondary | ICD-10-CM

## 2024-05-11 DIAGNOSIS — I480 Paroxysmal atrial fibrillation: Secondary | ICD-10-CM

## 2024-05-11 DIAGNOSIS — G2581 Restless legs syndrome: Secondary | ICD-10-CM

## 2024-05-11 DIAGNOSIS — G4739 Other sleep apnea: Secondary | ICD-10-CM | POA: Diagnosis not present

## 2024-05-11 DIAGNOSIS — Z7189 Other specified counseling: Secondary | ICD-10-CM

## 2024-05-13 ENCOUNTER — Other Ambulatory Visit: Payer: Self-pay | Admitting: Internal Medicine

## 2024-05-24 ENCOUNTER — Ambulatory Visit: Payer: Self-pay | Admitting: Neurology

## 2024-05-24 DIAGNOSIS — I480 Paroxysmal atrial fibrillation: Secondary | ICD-10-CM

## 2024-05-24 DIAGNOSIS — R0602 Shortness of breath: Secondary | ICD-10-CM

## 2024-05-24 DIAGNOSIS — I483 Typical atrial flutter: Secondary | ICD-10-CM

## 2024-05-24 DIAGNOSIS — G4739 Other sleep apnea: Secondary | ICD-10-CM

## 2024-05-24 DIAGNOSIS — I739 Peripheral vascular disease, unspecified: Secondary | ICD-10-CM

## 2024-05-24 DIAGNOSIS — I429 Cardiomyopathy, unspecified: Secondary | ICD-10-CM

## 2024-05-24 DIAGNOSIS — G620 Drug-induced polyneuropathy: Secondary | ICD-10-CM

## 2024-05-24 NOTE — Telephone Encounter (Signed)
-----   Message from Woodway Dohmeier sent at 05/24/2024  9:14 AM EDT ----- Very interesting titration on a FFM - BiPAP worked relatively well, but the patient didn't sleep very long on the final pressure -  Sleep-disordered breathing improved at the recommended pressure of   17/13 cm water to an AHI of 1.5/h with REM rebounding at final 17/13  cmH2O. Hypoxemia was resolved with therapy.  I will order  BiPAP settings of 17/12 cm water ( yes, I want a 5 cm water Split ) and new interface a FFM - [employer] in medium . Heated humidification.  ----- Message ----- From: Chalice Saunas, MD Sent: 05/24/2024   9:08 AM EDT To: Saunas Chalice, MD

## 2024-05-24 NOTE — Procedures (Signed)
 [facility] PAP TITRATION INTERPRETATION REPORT   STUDY DATE: 05/11/2024      PATIENT NAME:  Holly Benson         DATE OF BIRTH:  1933-01-26  PATIENT ID:  [id number]    TYPE OF STUDY:  CPAP  READING PHYSICIAN: DEDRA GORES, MD  Dr Stephane is PCP  SCORING TECHNICIAN: Delon Sprung, RPSGT   HISTORY: This 89 year-old Female  was scheduled for a BiPAP/ ASV titration and titration to oxygen  if needed. The patient has not been tested with our lab before -  we had ordered a HST 03-28-2024, high OSA count, hypoxia with known A fib and history of lung tumor/  COPD.   According to this home sleep test device 244.6 minutes( almost 60% of sleep time) was spent in hypoxia.    Last  ASV Set up date: 09/04/21 on Resmed S10 ASV-Airview. According to 2022 note, pt was TOC for sleep with Dr GORES." Pt also wanted to know if Dr Kylan Liberati would be willing to take on her ASV- she is not getting proper f/u care with her machine ( prescribed by dr Burnard, Cardiologist) and would really like to address this here. She is set up with a machine through [facility] and they don't help with mask refits. Her last SS was in 2020 Dr Burnard. "   RLS and atrial fibrillation history  The Epworth Sleepiness Scale was  endorsed at 7 out of 24 points (scores above or equal to 10 are suggestive of hypersomnolence). FSS : 46/63 points .Neck 13 ".  DESCRIPTION: A sleep technologist was in attendance for the duration of the recording.  Data collection, scoring, video monitoring, and reporting were performed in compliance with the AASM Manual for the Scoring of Sleep and Associated Events; ADDITIONAL INFORMATION:  Height: 61.0 in Weight: 142 lbs (BMI 26) Neck Size: 13.0 in    MEDICATIONS: Albuterol , Amlodipine , Eliquis , Zebeta , Klonopin , Tikosyn , Colace, Flonase , Lasix , Avapro , Requip .   SLEEP CONTINUITY AND SLEEP ARCHITECTURE: The patient was first titrated to CPAP, with a new interface  ResMed  AirFit F 30  extra small  and finally medium,  titrated from 5 cm to 15 cm water with the arrival of central events and poor sleep efficiency, finally changed to BIPAP. Beginning at 16/12 cm water no positive response was seen, advancing to 17/13 cm water  -left the patient with an AHI 1.5/h  (!) and REM rebound in lateral position.   Last BIPAP pressure was  16/12 cm water .    Lights off was at 21:33: and lights on 05:20: (7.8 hours in bed). Total sleep time was 274.0 minutes (43.8% supine;  56.2% lateral;  0.0% prone, 13.1% REM sleep), with a decreased sleep efficiency at 58.7%.  Sleep latency was normal at 13.5 minutes.   Of the total sleep time, the percentage of stage N1 sleep was 0.0%, stage N2 sleep was 80.7%, stage N3 sleep was 6.2%, and REM sleep was 13.1%. There were 1 Stage R periods observed on this study night, 67 awakenings (i.e. transitions to Stage W from any sleep stage), and 146.0 total stage transitions.  Wake after sleep onset (WASO) time accounted for 178 minutes.  AROUSAL: There were 142 arousals in total, for an arousal index of 31.1 arousals/hour.  Of these, 72 were identified as respiratory-related arousals (15.8 /h), 0 were PLM-related arousals (0.0 /h), and 119 were non-specific arousals (26.1 /h)  RESPIRATORY MONITORING:  Based on CMS criteria (using  a 4% oxygen  desaturation rule for scoring hypopneas), there were 29 apneas (19 obstructive; 10 central; 0 mixed), and 62 hypopneas.   The Apnea index was 6.4/h.  The Hypopnea index was 13.6/h. . The AHI (apnea-hypopnea index ) was 19.9/h overall (29.5 supine, 5.0 non-supine; 5.0 REM, 0.0 supine REM). There were 0 respiratory effort-related arousals (RERAs).  The RERA index was 0.0 events/h  Based on AASM criteria (using a 3% oxygen  desaturation and /or arousal rule for scoring hypopneas), there were 29 apneas (19 obstructive; 10 central; 0 mixed), and 62 hypopneas. Apnea index was 6.4. Hypopnea index was 13.6. The  apnea-hypopnea index was 19.9 overall (29.5 supine, 5.0 non-supine; 5.0 REM, 0.0 supine REM).  OXIMETRY: Total sleep time spent at, or below 88% was 1.8 minutes, or 0.6% of total sleep time. Respiratory events were associated with oxyhemoglobin desaturations (nadir during sleep 80%) from a mean of 93%).  CARDIAC: The EKG showed an  average heart rate during sleep was 57 bpm.  The maximum heart rate during sleep was 75 bpm. The maximum heart rate during recording was 88.  BODY POSITION: Duration of total sleep and percent of total sleep in their respective position is as follows: supine 120 minutes (43.8%), non-supine 154.0 minutes (56.2%); right 136 minutes (49.8%), left 17 minutes (6.4%), and prone 00 minutes (0.0%). Total supine REM sleep time was 00 minutes (0.0% of total REM sleep). LIMB MOVEMENTS: There were 0 periodic limb movements of sleep (0.0/h), of which 0 (0.0/h) were associated with an arousal.  Recommended Settings:    1 CPAP failed to address the patient s apnea at all pressures between 5 and 15 cm water. BiPAP worked best at 17/13 cm water.  Sleep-disordered breathing improved at the recommended pressure of 1.5/h with REM rebounding at final 17/13  cmH2O. Hypoxemia was resolved with therapy.   2. Total sleep time was reduced.     3. No significant periodic limb movements (PLMs) observed.           RECOMMENDATIONS: BiPAP settings of 17/12 cm water ( yes, I want a 5 cm water Split ) and new interface  a [employer] in medium .  Heated humidification.  The patient has used nasal pillows in the past but had high air leakage.       DEDRA GORES, MD                General Information  Name: Holly Benson, Holly Benson BMI: 26 Physician: ,   ID: [id number] Height: 61 in Technician: [place] [facility]  Sex: Female Weight: 142 lb Record: [id number]  Age: 58 [11/16/1932] Date: 05/11/2024 Scorer: [facility] [place]   Recommended Settings IPAP: N/A cmH20 EPAP: N/A cmH2O  AHI: N/A AHI (4%): N/A   Pressure IPAP/EPAP 00 05 06 07 08 09 11 16 / 12   O2 Vol 0.0 0.0 0.0 0.0 0.0 0.0 0.0 0.0  Time TRT 0.34m 58.35m 15.108m 36.49m 23.1m 15.34m 26.98m 62.42m   TST 0.91m 22.39m 14.62m 24.65m 19.30m 11.50m 20.35m 39.57m  Sleep Stage % Wake 0.0 62.1 9.7 31.9 19.1 25.8 23.1 37.6   % REM 0.0 0.0 0.0 0.0 0.0 0.0 0.0 53.8   % N1 0.0 0.0 0.0 0.0 0.0 0.0 0.0 0.0   % N2 0.0 97.7 100.0 100.0 100.0 100.0 100.0 46.2   % N3 0.0 2.3 0.0 0.0 0.0 0.0 0.0 0.0  Respiratory Total Events 0 6 15 14 18 7 10 3    Obs. Apn. 0 0 3 5  1 4 3  0   Mixed Apn. 0 0 0 0 0 0 0 0   Cen. Apn. 0 0 1 0 0 0 2 0   Hypopneas 0 6 11 9 17 3 5 3    AHI 0.00 16.36 64.29 34.29 56.84 36.52 30.00 4.62   Supine AHI 0.00 0.00 0.00 24.00 56.84 36.52 30.00 8.00   Prone AHI 0.00 0.00 0.00 0.00 0.00 0.00 0.00 0.00   Side AHI 0.00 16.36 64.29 60.00 0.00 0.00 0.00 3.81  Respiratory (4%) Hypopneas (4%) 0.00 6.00 11.00 9.00 17.00 3.00 5.00 3.00   AHI (4%) 0.00 16.36 64.29 34.29 56.84 36.52 30.00 4.62   Supine AHI (4%) 0.00 0.00 0.00 24.00 56.84 36.52 30.00 8.00   Prone AHI (4%) 0.00 0.00 0.00 0.00 0.00 0.00 0.00 0.00   Side AHI (4%) 0.00 16.36 64.29 60.00 0.00 0.00 0.00 3.81  Desat Profile <= 90% 0.103m 15.57m 4.20m 4.75m 5.67m 0.83m 1.69m 3.33m   <= 80% 0.52m 0.14m 0.58m 0.36m 0.13m 0.66m 0.74m 0.2m   <= 70% 0.34m 0.52m 0.60m 0.57m 0.64m 0.60m 0.57m 0.80m   <= 60% 0.37m 0.57m 0.12m 0.51m 0.57m 0.18m 0.70m 0.37m  Arousal Index Apnea 0.0 0.0 17.1 12.2 3.2 15.7 15.0 0.0   Hypopnea 0.0 8.2 25.7 19.6 44.2 20.9 12.0 1.5   LM 0.0 0.0 0.0 0.0 0.0 0.0 0.0 0.0   Spontaneous 0.0 16.4 34.3 22.0 31.6 41.7 33.0 24.6   Pressure IPAP/EPAP 13 17 / 13 15   O2 Vol 0.0 0.0 0.0  Time TRT 40.1m 160.10m 29.82m   TST 25.35m 80.2m 19.33m  Sleep Stage % Wake 38.3 50.0 32.8   % REM 0.0 18.8 0.0   % N1 0.0 0.0 0.0   % N2 100.0 60.0 100.0   % N3 0.0 20.6 0.0  Respiratory Total Events 12 2 4    Obs. Apn. 2 0 1   Mixed Apn. 0 0 0   Cen. Apn. 5 0 2   Hypopneas 5 2 1    AHI 28.80 1.50  12.31   Supine AHI 28.80 0.00 12.31   Prone AHI 0.00 0.00 0.00   Side AHI 0.00 1.50 0.00  Respiratory (4%) Hypopneas (4%) 5.00 2.00 1.00   AHI (4%) 28.80 1.50 12.31   Supine AHI (4%) 28.80 0.00 12.31   Prone AHI (4%) 0.00 0.00 0.00   Side AHI (4%) 0.00 1.50 0.00  Desat Profile <= 90% 0.72m 7.65m 0.28m   <= 80% 0.4m 5.23m 0.20m   <= 70% 0.35m 5.84m 0.26m   <= 60% 0.24m 5.54m 0.10m  Arousal Index Apnea 14.4 0.0 6.2   Hypopnea 9.6 0.8 3.1   LM 0.0 0.0 3.1   Spontaneous 50.4 8.3 58.5    [facility] CPAP Summary    General Information  Name: Holly Benson, Holly Benson BMI: 26.83 Physician: DEDRA GORES, MD  ID: [id number] Height: 61.0 in Technician: Delon Sprung, RPSGT  Sex: Female Weight: 142.0 lb Record: [id number]  Age: 49 [[birth year]] Date: 05/11/2024     Medical & Medication History    HST 03/22/24 pAHI by CMS guideline : 33.8/h REM pAHI: 48/h NREM: 30/h Current ASV user  Albuterol , Amlodipine , Eliquis , Zebeta , Klonopin , Tikosyn , Colace, Flonase , Lasix , Avapro , Requip .   Sleep Disorder   sleep related hypoxia   Comments   Patient is an 89 y/o female who was referred to the sleep lab for a CPAP titration. Patient had noted hypopneas, OSA, and CSA. Patient slept supine and lateral. Snoring was mild, and no  PLMS noted. EKG appeared to be NSR. Patient started out on 5cm h2o and was increased up to 15cm h2o. Due to CSA, tech switched patient to BIPAP and started at 16/12cm h20. Tech increased patient to a final pressure of 17/13cm h2o which appeared to be optimal pressure for patient. Tech also changed out style full face mask that patient was using at home which was an ResMed AirFit 30 extra small, we tried small cushion but patient continued having leak issues due to nasal pillow part of mask not sealing correctly. Switched to [employer], and leak remained 0, even on final pressure. Patient like the fit and style of mask. Patient had no complaints and  tech answered all questions.    CPAP start time: 09:33:55 PM CPAP end time: 05:20:19 AM   Time Total Supine Side Prone Upright  Recording (TRT) 7h 46.6m 3h 1.56m 4h 45.157m 0h 0.61m 0h 0.71m  Sleep (TST) 4h 34.57m 2h 0.36m 2h 34.46m 0h 0.80m 0h 0.52m   Latency N1 N2 N3 REM Onset Per. Slp. Eff.  Actual 0h 0.57m 0h 0.51m 0h 15.78m 4h 32.9m 0h 13.30m 0h 16.76m 58.84%   Stg Dur Wake N1 N2 N3 REM  Total 192.0 0.0 221.0 17.0 36.0  Supine 61.5 0.0 120.0 0.0 0.0  Side 130.5 0.0 101.0 17.0 36.0  Prone 0.0 0.0 0.0 0.0 0.0  Upright 0.0 0.0 0.0 0.0 0.0   Stg % Wake N1 N2 N3 REM  Total 41.2 0.0 80.5 6.2 13.1  Supine 13.2 0.0 43.7 0.0 0.0  Side 28.0 0.0 36.8 6.2 13.1  Prone 0.0 0.0 0.0 0.0 0.0  Upright 0.0 0.0 0.0 0.0 0.0     Apnea Summary Sub Supine Side Prone Upright  Total 29 Total 29 21 8  0 0    REM 0 0 0 0 0    NREM 29 21 8  0 0  Obs 19 REM 0 0 0 0 0    NREM 19 12 7  0 0  Mix 0 REM 0 0 0 0 0    NREM 0 0 0 0 0  Cen 10 REM 0 0 0 0 0    NREM 10 9 1  0 0   Rera Summary Sub Supine Side Prone Upright  Total 0 Total 0 0 0 0 0    REM 0 0 0 0 0    NREM 0 0 0 0 0   Hypopnea Summary Sub Supine Side Prone Upright  Total 62 Total 62 38 24 0 0    REM 3 0 3 0 0    NREM 59 38 21 0 0   4% Hypopnea Summary Sub Supine Side Prone Upright  Total (4%) 62 Total 62 38 24 0 0    REM 3 0 3 0 0    NREM 59 38 21 0 0     AHI Total Obs Mix Cen  19.89 Apnea 6.34 4.15 0.00 2.19   Hypopnea 13.55 -- -- --  19.89 Hypopnea (4%) 13.55 -- -- --    Total Supine Side Prone Upright  Position AHI 19.89 29.50 12.43 0.00 0.00  REM AHI 5.00   NREM AHI 22.18   Position RDI 19.89 29.50 12.43 0.00 0.00  REM RDI 5.00   NREM RDI 22.18    4% Hypopnea Total Supine Side Prone Upright  Position AHI (4%) 19.89 29.50 12.43 0.00 0.00  REM AHI (4%) 5.00   NREM AHI (4%) 22.18   Position RDI (4%) 19.89 29.50 12.43 0.00  0.00  REM RDI (4%) 5.00   NREM RDI (4%) 22.18    Desaturation Information  <100% <90% <80% <70% <60% <50% <40%   Supine 104 21 0 0 0 0 0  Side 75 26 0 0 0 0 0  Prone 0 0 0 0 0 0 0  Upright 0 0 0 0 0 0 0  Total 179 47 0 0 0 0 0  Desaturation threshold setting: 4% Minimum desaturation setting: 10 seconds SaO2 nadir: 67% The longest event was a 32 sec obstructive Hypopnea with a minimum SaO2 of 87%. The lowest SaO2 was 87% associated with a 21 sec obstructive Apnea. EKG Rates EKG Avg Max Min  Awake 58 88 50  Asleep 57 75 50  EKG Events: N/A Awakening/Arousal Information # of Awakenings 67  Wake after sleep onset 178.21m  Wake after persistent sleep 178.22m   Arousal Assoc. Arousals Index  Apneas 26 5.7  Hypopneas 46 10.1  Leg Movements 1 0.2  Snore 0.0 0.0  PTT Arousals 0 0.0  Spontaneous 119 26.0  Total 192 42.0  Myoclonus Information PLMS LMs Index  Total LMs during PLMS 0 0.0  LMs w/ Microarousals 0 0.0   LM LMs Index  w/ Microarousal 1 0.2  w/ Awakening 0 0.0  w/ Resp Event 0 0.0  Spontaneous 0 0.0  Total 1 0.2

## 2024-05-24 NOTE — Telephone Encounter (Signed)
 I called pt. I advised pt that Dr. Chalice reviewed their sleep study results and found that pt tolerated well with BiPAP. Dr. Chalice recommends that pt starts BiPAP. I reviewed PAP compliance expectations with the pt. Pt is agreeable to starting a CPAP. I advised pt that an order will be sent to a DME, [facility], and [facility] will call the pt within about one week after they file with the pt's insurance. [facility] will show the pt how to use the machine, fit for masks, and troubleshoot the CPAP if needed. A follow up appt was made for insurance purposes with Greig Forbes, NP on 08/16/2024 at 1:30 pm. Pt verbalized understanding to arrive 15 minutes early and bring their CPAP. Pt verbalized understanding of results. Pt had no questions at this time but was encouraged to call back if questions arise. I have sent the order to [facility] and have received confirmation that they have received the order.

## 2024-05-28 ENCOUNTER — Other Ambulatory Visit: Payer: Self-pay | Admitting: Adult Health

## 2024-05-30 ENCOUNTER — Ambulatory Visit: Admitting: Internal Medicine

## 2024-05-30 ENCOUNTER — Ambulatory Visit: Attending: Internal Medicine | Admitting: Internal Medicine

## 2024-05-30 VITALS — BP 142/64 | HR 67 | Ht 62.0 in | Wt 141.0 lb

## 2024-05-30 DIAGNOSIS — I4819 Other persistent atrial fibrillation: Secondary | ICD-10-CM | POA: Diagnosis not present

## 2024-05-30 DIAGNOSIS — I2510 Atherosclerotic heart disease of native coronary artery without angina pectoris: Secondary | ICD-10-CM | POA: Diagnosis not present

## 2024-05-30 DIAGNOSIS — M6281 Muscle weakness (generalized): Secondary | ICD-10-CM | POA: Diagnosis not present

## 2024-05-30 DIAGNOSIS — I480 Paroxysmal atrial fibrillation: Secondary | ICD-10-CM | POA: Insufficient documentation

## 2024-05-30 DIAGNOSIS — D6869 Other thrombophilia: Secondary | ICD-10-CM | POA: Insufficient documentation

## 2024-05-30 DIAGNOSIS — R0602 Shortness of breath: Secondary | ICD-10-CM | POA: Diagnosis not present

## 2024-05-30 MED ORDER — FUROSEMIDE 20 MG PO TABS: ORAL_TABLET | ORAL | Status: AC

## 2024-05-30 MED ORDER — ISOSORBIDE MONONITRATE ER 30 MG PO TB24: 30.0000 mg | ORAL_TABLET | Freq: Every day | ORAL | 3 refills | Status: DC

## 2024-05-30 MED ORDER — IRBESARTAN 150 MG PO TABS: 150.0000 mg | ORAL_TABLET | Freq: Two times a day (BID) | ORAL | 3 refills | Status: DC

## 2024-05-30 NOTE — Patient Instructions (Addendum)
 Medication Instructions:  START Imdur  30 mg daily every morning *If you need a refill on your cardiac medications before your next appointment, please call your pharmacy*  Lab Work: CK, BNP, CMET, Magnesium  today If you have labs (blood work) drawn today and your tests are completely normal, you will receive your results only by: MyChart Message (if you have MyChart) OR A paper copy in the mail If you have any lab test that is abnormal or we need to change your treatment, we will call you to review the results.   Follow-Up: At [facility] you and your health needs are our priority.  As part of our continuing mission to provide you with exceptional heart care, our providers are all part of one team.  This team includes your primary Cardiologist (physician) and Advanced Practice Providers or APPs (Physician Assistants and Nurse Practitioners) who all work together to provide you with the care you need, when you need it.  Your next appointment:   In 2 months with Josefa Beauvais NP or other APP

## 2024-05-30 NOTE — Progress Notes (Signed)
 OFFICE NOTE  Chief Complaint:  Progressive fatigue, DOE  Primary Care Physician: [name], [name], MD  HPI:  Holly Benson is a 89 y.o. female with a past medial history significant for atrial fibrillation, COPD, hypertension and dyslipidemia.  She is originally from [place]  but has been living in [place].  She was established and treated by Dr. Betti, a cardiac electrophysiologist with [facility]. [facility].  She underwent electrical cardioversion and was offered an A. fib ablation, but preferred medical therapy.  She has been on diltiazem  and Eliquis  as well as losartan  for hypertension.  She also takes pravastatin  and Spiriva for her COPD.  She denies any chest pain or worsening shortness of breath.  She reports she has had a stress test and an echo in the past, neither which were remarkable.  We will try to obtain those records from her prior cardiologist.  Recently she has noted some increase in palpitations and feeling like her heart is pounding.  This is mostly at night when she is trying to go to bed.  She says it can happen several times a week, but it certainly not daily.  11/10/2017  Holly Benson returns today for follow-up.  She underwent monitoring which demonstrated sinus rhythm with PACs and PVCs.  There were some periods of sinus tachycardia for which correlated with her palpitations.  No evidence of atrial fibrillation was noted.  She says she continues to have episodes of hard heartbeats and feeling like her heart is pounding.  She discussed this further with her daughter-in-law over the holidays who is a Advice worker.  I was provided with a letter which indicated all of her symptoms including twice a week where she wakes up in the middle night feeling suffocated.  She has had episodes of reactive hypoglycemia in the past that would cause elevated heart rate however blood sugars were normal this week.  She went to the ER on December 10 for  elevated heart rate, lightheadedness and elevated blood pressure however workup was unremarkable.  There was a concern for possible dehydration.  In the past her echo in [place] showed a EF was around 40-45% however she said that was more than 5 years ago.  LV function has not been reassessed.  01/12/2018  Holly Benson returns today for follow-up.  When I last saw her I placed her on low-dose metoprolol  for additional rate control.  Today she is noted to be in atrial flutter with variable ventricular response and PVCs at 79.  When I walked in I asked her how she was feeling and she said she felt great.  She is asymptomatic with this and was not aware that she was out of rhythm.  Interestingly on her monitor she only showed some PACs and PVCs with predominantly sinus rhythm but was having symptoms of palpitations.  It does not seem that her symptoms necessarily correlate with her arrhythmias.  This seems to be the case when I reviewed her records from [place].  She had both A. fib and a flutter was initially considered for flutter ablation however with her concomitant A. fib this was not pursued.  She also has had chest discomfort symptoms and underwent exercise treadmill testing in January 2017 which was negative for ischemia.  LVEF was 40-45% in the past however based on a repeat echo we performed this past month, her LVEF is now 60-65%.  She does have biatrial enlargement.  03/25/2018  Holly Benson was seen today in follow-up.  She underwent monitoring which showed sinus bradycardia, sinus arrhythmia, intermittent first-degree AV block and junctional rhythm with pauses greater than 2 seconds but less than 3 seconds.  There was short periods of second-degree AV block type II.  Ventricular couplets atrial pairs runs and trigeminy were all noted.  There is no significant atrial fibrillation or ventricular fibrillation.  No atrial flutter was noted.  She reports improvement in her symptoms after adjustment of her beta-blocker  and calcium  channel blocker.  Overall she is pleased with her symptoms at this time.  09/22/2018  Holly Benson is seen today for routine follow-up.  She again has some complaints of fatigue.  She is concerned this may be related to her medications.  She has not noted to have any significant bradycardia today in fact heart rates in the 60s with a solitary PVC on EKG.  She reports compliance with medicines her blood pressures been generally well controlled.  There is no evidence of A. fib today.  She is had no bleeding problems on Eliquis .  She is due for a refill of that medication.  She does report fatigue and difficulty staying asleep at night.  She is not noted to snore.  She denies any morning headaches but does oftentimes have to nap during the day.  07/17/2022  Holly Benson is seen today for follow-up.  She had just seen Charlies Arthur, with electrophysiology about 1 week ago.  She seems to be doing well on dofetilide .  Her EKG showed she was in rhythm without any significant QT changes.  She remains bradycardic and does get some fatigue but seems to be tolerable.  Her Eliquis  dose was decreased due to age over 60 and weight less than 60 kg.  She is scheduled to have upcoming surgery at [facility] for hernia.  From a cardiac standpoint I feel she would be at acceptable risk for this.  09/03/2023  Holly Benson is seen today in follow-up.  She reports she has been having some worsening fatigue with exertion as well as dyspnea.  She was recently noted to have a spot on her lung which was thought to be a lung cancer.  She has follow-up with Dr. Shannon in November.  It was not thought by pulmonary that this could cause her fatigue.  She has had intermittent A-fib although recently has not been in A-fib.  EKG today does not show any A-fib.  Of note on her high-resolution CT scan of the chest she does have calcification of the left main and multivessel coronary artery calcification.  This is certainly concerning  for possible obstructive coronary artery disease.  While she is denied any chest pain her symptoms are with exertion and do seem to be worsening.  05/30/2024  Holly Benson returns today for follow-up.  She has numerous concerns.  Primarily she has had muscle pain and weakness.  She tried a holiday off her statin which she said made no significant difference.  She does have a history of neuropathy.  She is also been progressively short of breath.  This has happened several times.  Back in November she underwent CT coronary angiography which showed two-vessel moderate obstructive coronary disease however FFR was negative.  She also has extensive pulmonary disease including volume loss and scarring seen on the CT as well as emphysema.  She saw Dr. Shelah in April but at that time and not mention any increase in sputum production but today noted that she has been having worsening sputum.  She  denies any fevers or chills.  She is on alternating doses of 20 and 40 mg of Lasix  for congestive heart failure although her LVEF was normal recently on echo however she may have some diastolic dysfunction.  Additionally she has been concerned about continued weight gain which she does not feel is completely fluid.  Also she is describing chest pressure and tightness at the base of her neck.  This is not worse with exertion typically but is not present regularly.  EKG shows sinus rhythm today.  She is on dofetilide .  PMHx:  Past Medical History:  Diagnosis Date   Arrhythmia    Arthritis    Atrial fibrillation (HCC)    Constipation    chronic   COPD (chronic obstructive pulmonary disease) (HCC)    History of radiation therapy    Right lung- 09/22/23-09/29/23- Dr. Lynwood Nasuti   Hyperlipidemia    Hypertension    pulmonary   OSA on CPAP    Osteoarthritis    Osteoporosis    Peripheral neuropathy    Pulmonary hypertension (HCC)    RLS (restless legs syndrome)    Sleep apnea     Past Surgical History:   Procedure Laterality Date   ABDOMINAL AORTOGRAM W/LOWER EXTREMITY N/A 03/21/2020   Procedure: ABDOMINAL AORTOGRAM W/ Bilateral LOWER EXTREMITY Run[place];  Surgeon: Darron Deatrice LABOR, MD;  Location: [facility] INVASIVE [facility];  Service: Cardiovascular;  Laterality: N/A;   ABDOMINAL HYSTERECTOMY Bilateral    arm surgery Right    Broken arm and has a plate in it   CARDIOVERSION N/A 03/12/2023   Procedure: CARDIOVERSION;  Surgeon: Shlomo Wilbert SAUNDERS, MD;  Location: [facility] INVASIVE CV LAB;  Service: Cardiovascular;  Laterality: N/A;   CATARACT EXTRACTION Bilateral    COLONOSCOPY     More than 10 years ago [facility]   HERNIA REPAIR  08/2022   KNEE ARTHROSCOPY Right    PERIPHERAL VASCULAR INTERVENTION Bilateral 03/21/2020   Procedure: PERIPHERAL VASCULAR INTERVENTION;  Surgeon: Darron Deatrice LABOR, MD;  Location: [facility]VAS[facility] CV [facility];  Service: Cardiovascular;  Laterality: Bilateral;  external iliac   TONSILLECTOMY     ventral herniorrhaphy  2023   mesh used    FAMHx:  Family History  Problem Relation Age of Onset   Suicidality Father    Stroke Maternal Grandfather    Hypertension Sister    Colon cancer Neg Hx    Esophageal cancer Neg Hx    Pancreatic cancer Neg Hx    Stomach cancer Neg Hx    Liver disease Neg Hx     SOCHx:   reports that she quit smoking about 32 years ago. Her smoking use included cigarettes. She started smoking about 69 years ago. She has a 55.5 pack-year smoking history. She has never used smokeless tobacco. She reports current alcohol  use of about 7.0 standard drinks of alcohol  per week. She reports that she does not use drugs.  ALLERGIES:  Allergies  Allergen Reactions   Lovenox [Enoxaparin Sodium] Hives, Rash and Other (See Comments)    PT STATES SHE BROKE OUT IN A RASH HEAD TO TOE AND LASTED ABOUT 3 WEEKS    Metoprolol  Succinate [Metoprolol ] Other (See Comments)    Muscle aches, hand pain, and tingling    ROS: Pertinent items noted in HPI and remainder of  comprehensive ROS otherwise negative.  HOME MEDS: Current Outpatient Medications on File Prior to Visit  Medication Sig Dispense Refill   acetaminophen  (TYLENOL ) 325 MG tablet Take 325-650 mg by mouth  every 6 (six) hours as needed for mild pain (pain score 1-3), headache or fever.     albuterol  (PROVENTIL ) (2.5 MG/3ML) 0.083% nebulizer solution Take 3 mLs (2.5 mg total) by nebulization every 6 (six) hours as needed for wheezing or shortness of breath. 75 mL 5   albuterol  (VENTOLIN  HFA) 108 (90 Base) MCG/ACT inhaler INHALE 2 PUFFS BY MOUTH EVERY 6 HOURS AS NEEDED FOR WHEEZING OR FOR SHORTNESS OF BREATH 18 g 6   apixaban  (ELIQUIS ) 5 MG TABS tablet Take 1 tablet (5 mg total) by mouth 2 (two) times daily. 180 tablet 1   bisoprolol  (ZEBETA ) 10 MG tablet TAKE 1 TABLET BY MOUTH AT BEDTIME 90 tablet 3   Cholecalciferol (VITAMIN D3 MAXIMUM STRENGTH) 125 MCG (5000 UT) capsule Take 5,000 Units by mouth daily.     clonazePAM  (KLONOPIN ) 0.5 MG tablet Take 0.5 tablets (0.25 mg total) by mouth at bedtime as needed (for restless legs). 30 tablet 5   docusate sodium  (COLACE) 100 MG capsule Take 300 mg by mouth daily as needed for moderate constipation.     dofetilide  (TIKOSYN ) 250 MCG capsule TAKE 1 CAPSULE BY MOUTH 2 TIMES A DAY. 180 capsule 2   furosemide  (LASIX ) 20 MG tablet Take 1 tablet by mouth on Monday, Wednesday and Friday. Take 2 tablets by mouth on all other days (Patient taking differently: Take 20-40 mg by mouth See admin instructions. Take 20 mg by mouth in the morning on Sun/Tues/Thurs/Sat and 40 mg on Mon/Wed/Fri) 132 tablet 3   irbesartan  (AVAPRO ) 300 MG tablet TAKE 1 TABLET BY MOUTH DAILY 90 tablet 2   MAGNESIUM  OXIDE PO Take 1 tablet by mouth daily.     Misc Natural Products (COLON CLEANSE) CAPS Take 4 capsules by mouth daily as needed (constipation).     Multiple Vitamins-Minerals (PRESERVISION AREDS 2) CAPS Take 1 capsule by mouth 2 (two) times daily.     polyethylene glycol (MIRALAX  /  GLYCOLAX ) 17 g packet Take 17 g by mouth daily as needed for moderate constipation (MIX AS DIRECTED).     rOPINIRole  (REQUIP ) 0.5 MG tablet TAKE 1 TABLET BY MOUTH EVERY MORNING AND TAKE 1 TABLET BY MOUTH AFTER 3PM AND TAKE 1 TABLET AFTER DINNER 8PM 90 tablet 5   rosuvastatin  (CRESTOR ) 20 MG tablet Take 1 tablet (20 mg total) by mouth daily. 90 tablet 3   STIOLTO RESPIMAT  2.5-2.5 MCG/ACT AERS Inhale 2 puffs into the lungs in the morning.     amLODipine  (NORVASC ) 5 MG tablet Take 1 tablet (5 mg total) by mouth daily. (Patient not taking: Reported on 05/30/2024) 30 tablet 7   Current Facility-Administered Medications on File Prior to Visit  Medication Dose Route Frequency Provider Last Rate Last Admin   technetium tetrofosmin  (TC-MYOVIEW ) injection 31.9 millicurie  31.9 millicurie Intravenous Once PRN Raford Riggs, MD        LABS/IMAGING: No results found for this or any previous visit (from the past 48 hours). No results found.  LIPID PANEL:    Component Value Date/Time   CHOL 159 04/12/2024 0951   TRIG 64 04/12/2024 0951   HDL 75 04/12/2024 0951   CHOLHDL 2.1 04/12/2024 0951   CHOLHDL 2.7 05/26/2022 1046   LDLCALC 71 04/12/2024 0951   LDLCALC 125 (H) 05/26/2022 1046     WEIGHTS: Wt Readings from Last 3 Encounters:  05/30/24 141 lb (64 kg)  04/12/24 142 lb 12.8 oz (64.8 kg)  02/18/24 141 lb (64 kg)    VITALS: BP (!) 142/64 (BP  Location: Left Arm, Patient Position: Sitting, Cuff Size: Normal)   Pulse 67   Ht 5' 2 (1.575 m)   Wt 141 lb (64 kg)   SpO2 94%   BMI 25.79 kg/m   EXAM: General appearance: alert and no distress Neck: no carotid bruit, no JVD and thyroid  not enlarged, symmetric, no tenderness/mass/nodules Lungs: clear to auscultation bilaterally Heart: regular rate and rhythm Abdomen: soft, non-tender; bowel sounds normal; no masses,  no organomegaly Extremities: extremities normal, atraumatic, no cyanosis or edema Pulses: 2+ and symmetric Skin: Skin color,  texture, turgor normal. No rashes or lesions Neurologic: Grossly normal Psych: Pleasant  EKG: Deferred  ASSESSMENT: Progressive fatigue and dyspnea on exertion -LVEF 60 to 65% with severe biatrial enlargement by echo (11/2023) Chest pressure Multivessel and left main coronary artery calcification -CAC score of 1999 with three-vessel coronary disease, however FFR negative (09/2023) History of atrial fibrillation, status post DCCV, maintaining sinus rhythm on dofetilide  Paroxysmal atrial flutter CHADSVASC score of 4 - on Eliquis  Hypertension  Dyslipidemia  Lung cancer -with right lung scarring and volume loss COPD/emphysema  PLAN: 1.   Holly Benson has a number of concerns today including muscle pains, progressive shortness of breath and fatigue and does have underlying coronary artery disease notably a very high calcium  score however FFR was negative in November 2024.  It is possible she could have had some progression of her disease or may have clinically significant coronary disease at this point.  I have advised starting Imdur  30 mg daily to see if there is any benefit.  If she continues to have ongoing symptoms or no relief, she may need a definitive cardiac catheterization.  I do suspect that some of her progressive shortness of breath is pulmonary.  She does have scarring and volume loss of the right lung.  She has COPD and notes that recently she has had an increase in phlegm.  She is on Stiolto and sees Dr. Shelah.  I advised that she contact Dr. Shelah for follow-up.  Will get lab work today including CK, BNP, BMET and magnesium .  She also provided a copy of a living will/DURABLE POWER OF ATTORNEY which we will have scanned into the chart today.  Plan follow-up with APP in 2 months.  TIME SPENT WITH PATIENT: 40 minutes of direct patient care. More than 50% of that time was spent on coordination of care and counseling regarding multiple medical problems as outlined above.  Vinie KYM Maxcy, MD, [facility], FNLA, FACP  [place]  [facility]  Medical Director of the [facility]  Card[facility] Diplomate of the American Board of Clinical Lipidology Attending Cardiologist  Direct Dial: [phone]  Fax: [phone]  Website:  [email or web address]   Vinie BROCKS Genella Bas 05/30/2024, 10:11 AM

## 2024-05-31 ENCOUNTER — Telehealth: Payer: Self-pay | Admitting: Pharmacy Technician

## 2024-05-31 ENCOUNTER — Ambulatory Visit: Payer: Self-pay | Admitting: Internal Medicine

## 2024-05-31 ENCOUNTER — Other Ambulatory Visit (HOSPITAL_COMMUNITY): Payer: Self-pay

## 2024-05-31 DIAGNOSIS — Z79899 Other long term (current) drug therapy: Secondary | ICD-10-CM

## 2024-05-31 LAB — BASIC METABOLIC PANEL WITH GFR
BUN/Creatinine Ratio: 23 (ref 12–28)
BUN: 19 mg/dL (ref 8–27)
CO2: 24 mmol/L (ref 20–29)
Calcium: 9.4 mg/dL (ref 8.7–10.3)
Chloride: 102 mmol/L (ref 96–106)
Creatinine, Ser: 0.82 mg/dL (ref 0.57–1.00)
Glucose: 81 mg/dL (ref 70–99)
Potassium: 4.5 mmol/L (ref 3.5–5.2)
Sodium: 140 mmol/L (ref 134–144)
eGFR: 69 mL/min/1.73 (ref >59)

## 2024-05-31 LAB — PRO B NATRIURETIC PEPTIDE: NT-Pro BNP: 2251 pg/mL — ABNORMAL HIGH (ref 0–738)

## 2024-05-31 LAB — MAGNESIUM: Magnesium: 2.1 mg/dL (ref 1.6–2.3)

## 2024-05-31 LAB — CK: Total CK: 51 U/L (ref 26–161)

## 2024-05-31 NOTE — Telephone Encounter (Signed)
   LF 300mg  05/16/24 #90/90 then on 05/30/24 irbesartan  150mg  1 bid was sent in. Insurance said the 150mg  1 bid needed to be changed to a higher dose at 1 a day

## 2024-05-31 NOTE — Telephone Encounter (Addendum)
   LF 300mg  05/16/24 #90/90 then on 05/30/24 irbesartan  150mg  1 bid was sent in. Insurance said the 150mg  1 bid needed to be changed to a higher dose at 1 a day

## 2024-05-31 NOTE — Telephone Encounter (Signed)
 I called and spoke to [facility]lo[facility] Prior and they said they no longer see the 150mg  prescription but they have the 300mg  prescription and they have it on file for next fill

## 2024-06-02 ENCOUNTER — Encounter: Payer: Self-pay | Admitting: Internal Medicine

## 2024-06-08 ENCOUNTER — Encounter: Payer: Self-pay | Admitting: Family Medicine

## 2024-06-08 ENCOUNTER — Ambulatory Visit (INDEPENDENT_AMBULATORY_CARE_PROVIDER_SITE_OTHER): Admitting: Family Medicine

## 2024-06-08 VITALS — BP 128/80 | HR 67 | Temp 98.4°F | Resp 16 | Ht 62.0 in | Wt 143.4 lb

## 2024-06-08 DIAGNOSIS — L2989 Other pruritus: Secondary | ICD-10-CM | POA: Diagnosis not present

## 2024-06-08 MED ORDER — FAMOTIDINE 20 MG PO TABS: 20.0000 mg | ORAL_TABLET | Freq: Two times a day (BID) | ORAL | 0 refills | Status: DC

## 2024-06-08 MED ORDER — SPIRONOLACTONE 25 MG PO TABS
12.5000 mg | ORAL_TABLET | Freq: Every day | ORAL | 3 refills | Status: DC
Start: 2024-06-08 — End: 2024-09-21

## 2024-06-08 MED ORDER — PREDNISONE 20 MG PO TABS: 40.0000 mg | ORAL_TABLET | Freq: Every day | ORAL | 0 refills | Status: AC

## 2024-06-08 MED ORDER — METHYLPREDNISOLONE ACETATE 40 MG/ML IJ SUSP
40.0000 mg | Freq: Once | INTRAMUSCULAR | Status: AC
  Administered 2024-06-08: 40 mg via INTRAMUSCULAR

## 2024-06-08 NOTE — Progress Notes (Signed)
 ACUTE VISIT Chief Complaint  Patient presents with   Urticaria    Ongoing issue, newest outbreak happened in June, lasted 3 weeks and went away, but has now returned.    Discussed the use of AI scribe software for clinical note transcription with the patient, who gave verbal consent to proceed.  History of Present Illness Holly Benson is an 89 year old female patient of Dr. Coralie who was unavailable today, with a PMHx significant for PAF, cardiomyopathy, HTN, chronic anticoagulation, sleep apnea synd,COPD who presents with a recent flare-up of chronic hives.  She has a history of chronic hives for about ten years, and reports that a previous PCP called it chronic hives with unknown origin. Typically, she experiences one or two bumps every three to four months.  This summer, starting in June, she experienced a significant flare-up with multiple bumps appearing on her chest, lasting about two weeks before resolving. The hives reappeared approximately ten days ago, affecting her lower back, arms, right thigh,and chest. The bumps itch intensely, and some are beginning to resolve.  She denies any known triggers such as new medications, detergents, or contact with allergens. She has not been taking any medications for the hives, except for using over-the-counter cortisone cream for temporary relief.  No fever, chills, abnormal weight loss, cough, wheezing, nausea, or abdominal pain.  She has a history of HTN,HFpEF,atrial fibrillation and has previously taken prednisone  tablets without issues related to her heart condition.  Review of Systems  Constitutional:  Negative for activity change and appetite change.  HENT:  Negative for facial swelling, mouth sores, sore throat and trouble swallowing.   Eyes:  Negative for discharge, redness and itching.  Respiratory:  Negative for shortness of breath and stridor.   Cardiovascular:  Negative for chest pain, palpitations and leg  swelling.  Gastrointestinal:  Negative for nausea and vomiting.  Genitourinary:  Negative for decreased urine volume, dysuria and hematuria.  Neurological:  Negative for syncope, weakness and numbness.  Psychiatric/Behavioral:  Negative for confusion and hallucinations.   See other pertinent positives and negatives in HPI.  Current Outpatient Medications on File Prior to Visit  Medication Sig Dispense Refill   acetaminophen  (TYLENOL ) 325 MG tablet Take 325-650 mg by mouth every 6 (six) hours as needed for mild pain (pain score 1-3), headache or fever.     albuterol  (VENTOLIN  HFA) 108 (90 Base) MCG/ACT inhaler INHALE 2 PUFFS BY MOUTH EVERY 6 HOURS AS NEEDED FOR WHEEZING OR FOR SHORTNESS OF BREATH 18 g 6   apixaban  (ELIQUIS ) 5 MG TABS tablet Take 1 tablet (5 mg total) by mouth 2 (two) times daily. 180 tablet 1   bisoprolol  (ZEBETA ) 10 MG tablet TAKE 1 TABLET BY MOUTH AT BEDTIME 90 tablet 3   Cholecalciferol (VITAMIN D3 MAXIMUM STRENGTH) 125 MCG (5000 UT) capsule Take 5,000 Units by mouth daily.     clonazePAM  (KLONOPIN ) 0.5 MG tablet Take 0.5 tablets (0.25 mg total) by mouth at bedtime as needed (for restless legs). 30 tablet 5   docusate sodium  (COLACE) 100 MG capsule Take 300 mg by mouth daily as needed for moderate constipation.     dofetilide  (TIKOSYN ) 250 MCG capsule TAKE 1 CAPSULE BY MOUTH 2 TIMES A DAY. 180 capsule 2   furosemide  (LASIX ) 20 MG tablet Take 1 tablet (20 mg total) by mouth 3 (three) times a week AND 2 tablets (40 mg total) 4 (four) times a week. Take 20 mg by mouth in the morning on Mon/Wed/Fri and  40 mg on Sun/Tues/Thurs/Sat.     irbesartan  (AVAPRO ) 150 MG tablet Take 1 tablet (150 mg total) by mouth in the morning and at bedtime. 180 tablet 3   MAGNESIUM  OXIDE PO Take 1 tablet by mouth daily.     Misc Natural Products (COLON CLEANSE) CAPS Take 4 capsules by mouth daily as needed (constipation).     Multiple Vitamins-Minerals (PRESERVISION AREDS 2) CAPS Take 1 capsule by  mouth 2 (two) times daily.     polyethylene glycol (MIRALAX  / GLYCOLAX ) 17 g packet Take 17 g by mouth daily as needed for moderate constipation (MIX AS DIRECTED).     rOPINIRole  (REQUIP ) 0.5 MG tablet TAKE 1 TABLET BY MOUTH EVERY MORNING AND TAKE 1 TABLET BY MOUTH AFTER 3PM AND TAKE 1 TABLET AFTER DINNER 8PM 90 tablet 5   rosuvastatin  (CRESTOR ) 20 MG tablet Take 1 tablet (20 mg total) by mouth daily. 90 tablet 3   STIOLTO RESPIMAT  2.5-2.5 MCG/ACT AERS Inhale 2 puffs into the lungs in the morning.     spironolactone  (ALDACTONE ) 25 MG tablet Take 0.5 tablets (12.5 mg total) by mouth daily. 45 tablet 3   Current Facility-Administered Medications on File Prior to Visit  Medication Dose Route Frequency Provider Last Rate Last Admin   technetium tetrofosmin  (TC-MYOVIEW ) injection 31.9 millicurie  31.9 millicurie Intravenous Once PRN Raford Riggs, MD        Past Medical History:  Diagnosis Date   Allergy Childhod   Seasonal   Arrhythmia    Arthritis    Atrial fibrillation (HCC)    Cancer (HCC)    Constipation    chronic   COPD (chronic obstructive pulmonary disease) (HCC)    Emphysema of lung (HCC)    GERD (gastroesophageal reflux disease) ?   Occasional   History of radiation therapy    Right lung- 09/22/23-09/29/23- Dr. Lynwood Nasuti   Hyperlipidemia    Hypertension    pulmonary   OSA on CPAP    Osteoarthritis    Osteoporosis    Peripheral neuropathy    Pulmonary hypertension (HCC)    RLS (restless legs syndrome)    Sleep apnea    Allergies  Allergen Reactions   Lovenox [Enoxaparin Sodium] Hives, Rash and Other (See Comments)    PT STATES SHE BROKE OUT IN A RASH HEAD TO TOE AND LASTED ABOUT 3 WEEKS    Metoprolol  Succinate [Metoprolol ] Other (See Comments)    Muscle aches, hand pain, and tingling    Social History   Socioeconomic History   Marital status: Widowed    Spouse name: Not on file   Number of children: 3   Years of education: Not on file   Highest  education level: Master's degree (e.g., MA, MS, MEng, MEd, MSW, MBA)  Occupational History   Occupation: retired    Comment: retired Comptroller  Tobacco Use   Smoking status: Former    Current packs/day: 0.00    Average packs/day: 1.5 packs/day for 37.0 years (55.5 ttl pk-yrs)    Types: Cigarettes    Start date: 72    Quit date: 11/04/1991    Years since quitting: 32.6   Smokeless tobacco: Never   Tobacco comments:    Former smoker 03/14/22  Vaping Use   Vaping status: Never Used  Substance and Sexual Activity   Alcohol  use: Yes    Alcohol /week: 7.0 standard drinks of alcohol     Types: 7 Glasses of wine per week    Comment: 1 glass of wine nightly 03/14/22  Drug use: No   Sexual activity: Not Currently    Birth control/protection: Surgical    Comment: Hyst, First IC >16y/o, Partners <5, No hx of STIs, DES-neg  Other Topics Concern   Not on file  Social History Narrative   Lives with daughter   Social Drivers of Health   Financial Resource Strain: Low Risk  (05/29/2024)   Overall Financial Resource Strain (CARDIA)    Difficulty of Paying Living Expenses: Not hard at all  Food Insecurity: No Food Insecurity (05/29/2024)   Hunger Vital Sign    Worried About Running Out of Food in the Last Year: Never true    Ran Out of Food in the Last Year: Never true  Transportation Needs: No Transportation Needs (05/29/2024)   PRAPARE - Administrator, Civil Service (Medical): No    Lack of Transportation (Non-Medical): No  Physical Activity: Inactive (05/29/2024)   Exercise Vital Sign    Days of Exercise per Week: 0 days    Minutes of Exercise per Session: Not on file  Stress: No Stress Concern Present (05/29/2024)   [employer] of Occupational Health - Occupational Stress Questionnaire    Feeling of Stress: Not at all  Social Connections: Moderately Integrated (05/29/2024)   Social Connection and Isolation Panel    Frequency of Communication with Friends and Family:  More than three times a week    Frequency of Social Gatherings with Friends and Family: Twice a week    Attends Religious Services: More than 4 times per year    Active Member of [employer] or Organizations: Yes    Attends Banker Meetings: More than 4 times per year    Marital Status: Widowed   Vitals:   06/08/24 1019  BP: 128/80  Pulse: 67  Resp: 16  Temp: 98.4 F (36.9 C)  SpO2: 95%   Body mass index is 26.22 kg/m.  Physical Exam Vitals and nursing note reviewed.  Constitutional:      General: She is not in acute distress.    Appearance: She is well-developed.  HENT:     Head: Normocephalic and atraumatic.     Mouth/Throat:     Mouth: Mucous membranes are moist.     Pharynx: Oropharynx is clear.  Eyes:     Conjunctiva/sclera: Conjunctivae normal.  Cardiovascular:     Rate and Rhythm: Normal rate and regular rhythm.     Heart sounds: No murmur heard. Pulmonary:     Effort: Pulmonary effort is normal. No respiratory distress.     Breath sounds: Normal breath sounds.  Lymphadenopathy:     Cervical: No cervical adenopathy.  Skin:    General: Skin is warm.     Findings: Rash present. No ecchymosis. Rash is scaling and urticarial.     Comments: Excoriations noted on posterior neck and lower back R>L. Several areas of rash noted: posterior neck, lower back,  Lesions on upper chest seem to be fading, no erythematous and finely scaly.  Neurological:     General: No focal deficit present.     Mental Status: She is alert and oriented to person, place, and time.     Gait: Gait normal.  Psychiatric:        Mood and Affect: Mood and affect normal.    ASSESSMENT AND PLAN: Ms.Madelena Blaze Sandin was seen here today for Hives.   Pruritic erythematous rash -     predniSONE ; Take 2 tablets (40 mg total) by mouth daily with breakfast for  5 days.  Dispense: 10 tablet; Refill: 0 -     Famotidine ; Take 1 tablet (20 mg total) by mouth 2 (two) times daily for 14 days.   Dispense: 28 tablet; Refill: 0 -     methylPREDNISolone  Acetate  This is a chronic problem, idiopathic.  She is having exacerbation more frequent, has not identified exacerbating or alleviating factors. Continue OTC cortisone twice daily as needed. Today after verbal consent and discussion of possible side effects, she received Depo-Medrol  40 mg IM. Instructed to start prednisone  tomorrow 40 mg daily with breakfast for 5 days.  She has taken this medication in the past with no side effects. Pepcid  20 mg twice daily for 2 weeks and cetirizine 10 mg daily.  We discussed some side effects of these medications. For now she would like to hold on immunology referral. She has an appointment with her dermatologist in 07/2024. Instructed about warning signs.  Return if symptoms worsen or fail to improve, for keep next appointment.  I,Emily Lagle,acting as a Neurosurgeon for Kynzley Dowson [place], MD.,have documented all relevant documentation on the behalf of Mylez Venable [place], MD,as directed by  Jancie Kercher [place], MD while in the presence of Scotland Korver [place], MD.  I, Aeson Sawyers [place], MD, have reviewed all documentation for this visit. The documentation on 06/08/24 for the exam, diagnosis, procedures, and orders are all accurate and complete. Lynne Righi G. [place], MD  [facility]. [place] office.

## 2024-06-08 NOTE — Patient Instructions (Addendum)
 A few things to remember from today's visit:  Pruritic erythematous rash - Plan: predniSONE  (DELTASONE ) 20 MG tablet, famotidine  (PEPCID ) 20 MG tablet Here in the office you received Depo-Medrol  40 mg intramuscular. Tomorrow start prednisone  2 tablets daily for 5 days. You can also take Pepcid  20 mg twice daily for 2 weeks, this medication can cause drowsiness. Zyrtec 10 mg daily. Continue over-the-counter cortisone twice daily as needed. I will treat this problem with your dermatologist during your next follow-up visit. Follow-up with Dr. Theophilus if the problem increases in frequency.  If you need refills for medications you take chronically, please call your pharmacy. Do not use My Chart to request refills or for acute issues that need immediate attention. If you send a my chart message, it may take a few days to be addressed, specially if I am not in the office.  Please be sure medication list is accurate. If a new problem present, please set up appointment sooner than planned today.

## 2024-06-09 ENCOUNTER — Ambulatory Visit: Attending: Cardiology

## 2024-06-09 VITALS — BP 140/60 | HR 51 | Ht 62.0 in | Wt 142.4 lb

## 2024-06-09 DIAGNOSIS — Z79899 Other long term (current) drug therapy: Secondary | ICD-10-CM | POA: Insufficient documentation

## 2024-06-09 DIAGNOSIS — I10 Essential (primary) hypertension: Secondary | ICD-10-CM | POA: Insufficient documentation

## 2024-06-09 DIAGNOSIS — I272 Pulmonary hypertension, unspecified: Secondary | ICD-10-CM | POA: Diagnosis present

## 2024-06-09 DIAGNOSIS — I480 Paroxysmal atrial fibrillation: Secondary | ICD-10-CM | POA: Insufficient documentation

## 2024-06-09 DIAGNOSIS — I509 Heart failure, unspecified: Secondary | ICD-10-CM | POA: Diagnosis not present

## 2024-06-09 NOTE — Patient Instructions (Addendum)
 Medication Instructions:  Your physician recommends that you continue on your current medications as directed. Please refer to the Current Medication list given to you today.  Please note: We ask at that you not bring children with you during ultrasound (echo/ vascular) testing. Due to room size and safety concerns, children are not allowed in the ultrasound rooms during exams. Our front office staff cannot provide observation of children in our lobby area while testing is being conducted. An adult accompanying a patient to their appointment will only be allowed in the ultrasound room at the discretion of the ultrasound technician under special circumstances. We apologize for any inconvenience. u *If you need a refill on your cardiac medications before your next appointment, please call your pharmacy*  Lab Work: Please complete a BMET In one week.   If you have labs (blood work) drawn today and your tests are completely normal, you will receive your results only by: MyChart Message (if you have MyChart) OR A paper copy in the mail If you have any lab test that is abnormal or we need to change your treatment, we will call you to review the results.  Testing/Procedures: None.  Follow-Up: At [facility], you and your health needs are our priority.  As part of our continuing mission to provide you with exceptional heart care, our providers are all part of one team.  This team includes your primary Cardiologist (physician) and Advanced Practice Providers or APPs (Physician Assistants and Nurse Practitioners) who all work together to provide you with the care you need, when you need it.   We recommend signing up for the patient portal called MyChart.  Sign up information is provided on this After Visit Summary.  MyChart is used to connect with patients for Virtual Visits (Telemedicine).  Patients are able to view lab/test results, encounter notes, upcoming appointments, etc.  Non-urgent  messages can be sent to your provider as well.   To learn more about what you can do with MyChart, go to [email or web address].

## 2024-06-09 NOTE — Progress Notes (Signed)
   Nurse Visit   Date of Encounter: 06/09/2024 ID: Holly Benson [place], Holly Benson 05-Aug-1933, MRN [id number]  PCP:  [name], [name], MD   [facility] Providers Cardiologist:  Vinie JAYSON Maxcy, MD Electrophysiologist:  Soyla Gladis Norton, MD      Visit Details   VS:  BP (!) 140/60   Pulse (!) 51   Ht 5' 2 (1.575 m)   Wt 142 lb 6.4 oz (64.6 kg)   SpO2 93%   BMI 26.05 kg/m  , BMI Body mass index is 26.05 kg/m.  Wt Readings from Last 3 Encounters:  06/09/24 142 lb 6.4 oz (64.6 kg)  06/08/24 143 lb 6 oz (65 kg)  05/30/24 141 lb (64 kg)     Reason for visit: Patient on dofetilide  with increase BNP, Dr. Maxcy requests EKG to check Qtc. Performed today: Vitals, EKG, Provider consulted:Dr. Jeffrie, and Education Changes (medications, testing, etc.) : No changes advised. Length of Visit: 60 minutes    Medications Adjustments/Labs and Tests Ordered: No orders of the defined types were placed in this encounter.  Vital signs stable as documented. Patient denies SOB, chest pain, palpitations.  Reviewed EKG with Dr. Jeffrie who advises no medication changes.     Signed, Geni LITTIE Sar, RN  06/09/2024 3:08 PM

## 2024-06-12 ENCOUNTER — Encounter: Payer: Self-pay | Admitting: Internal Medicine

## 2024-06-15 ENCOUNTER — Ambulatory Visit: Admitting: Internal Medicine

## 2024-06-15 DIAGNOSIS — L814 Other melanin hyperpigmentation: Secondary | ICD-10-CM | POA: Diagnosis not present

## 2024-06-15 DIAGNOSIS — R21 Rash and other nonspecific skin eruption: Secondary | ICD-10-CM | POA: Diagnosis not present

## 2024-06-15 DIAGNOSIS — L308 Other specified dermatitis: Secondary | ICD-10-CM | POA: Diagnosis not present

## 2024-06-15 DIAGNOSIS — Z85828 Personal history of other malignant neoplasm of skin: Secondary | ICD-10-CM | POA: Diagnosis not present

## 2024-06-15 DIAGNOSIS — D692 Other nonthrombocytopenic purpura: Secondary | ICD-10-CM | POA: Diagnosis not present

## 2024-06-15 DIAGNOSIS — D1801 Hemangioma of skin and subcutaneous tissue: Secondary | ICD-10-CM | POA: Diagnosis not present

## 2024-06-15 DIAGNOSIS — L821 Other seborrheic keratosis: Secondary | ICD-10-CM | POA: Diagnosis not present

## 2024-06-16 DIAGNOSIS — I10 Essential (primary) hypertension: Secondary | ICD-10-CM | POA: Diagnosis not present

## 2024-06-16 DIAGNOSIS — I272 Pulmonary hypertension, unspecified: Secondary | ICD-10-CM | POA: Diagnosis not present

## 2024-06-16 DIAGNOSIS — I509 Heart failure, unspecified: Secondary | ICD-10-CM | POA: Diagnosis not present

## 2024-06-16 DIAGNOSIS — Z79899 Other long term (current) drug therapy: Secondary | ICD-10-CM | POA: Diagnosis not present

## 2024-06-16 DIAGNOSIS — I480 Paroxysmal atrial fibrillation: Secondary | ICD-10-CM | POA: Diagnosis not present

## 2024-06-17 LAB — BASIC METABOLIC PANEL WITH GFR
BUN/Creatinine Ratio: 31 — ABNORMAL HIGH (ref 12–28)
BUN: 30 mg/dL — ABNORMAL HIGH (ref 8–27)
CO2: 23 mmol/L (ref 20–29)
Calcium: 9.9 mg/dL (ref 8.7–10.3)
Chloride: 100 mmol/L (ref 96–106)
Creatinine, Ser: 0.98 mg/dL (ref 0.57–1.00)
Glucose: 87 mg/dL (ref 70–99)
Potassium: 4.9 mmol/L (ref 3.5–5.2)
Sodium: 139 mmol/L (ref 134–144)
eGFR: 56 mL/min/1.73 — ABNORMAL LOW (ref >59)

## 2024-06-18 ENCOUNTER — Other Ambulatory Visit: Payer: Self-pay | Admitting: Family Medicine

## 2024-06-18 DIAGNOSIS — L2989 Other pruritus: Secondary | ICD-10-CM

## 2024-06-19 ENCOUNTER — Ambulatory Visit: Payer: Self-pay | Admitting: Internal Medicine

## 2024-07-05 ENCOUNTER — Other Ambulatory Visit: Payer: Self-pay | Admitting: Emergency Medicine

## 2024-07-05 ENCOUNTER — Ambulatory Visit (INDEPENDENT_AMBULATORY_CARE_PROVIDER_SITE_OTHER): Admitting: Internal Medicine

## 2024-07-05 ENCOUNTER — Encounter: Payer: Self-pay | Admitting: Internal Medicine

## 2024-07-05 VITALS — BP 120/78 | HR 73 | Temp 97.4°F | Wt 135.8 lb

## 2024-07-05 DIAGNOSIS — L509 Urticaria, unspecified: Secondary | ICD-10-CM

## 2024-07-05 NOTE — Progress Notes (Signed)
 Established Patient Office Visit     CC/Reason for Visit: Recurrent hives  HPI: Holly Benson is a 89 y.o. female who is coming in today for the above mentioned reasons.  She has been dealing with this issue for the past 6 years.  She was seen recently in the office and given a prednisone  taper which helped somewhat.  She has started taking Zyrtec once a day, requesting referral to allergist.   Past Medical/Surgical History: Past Medical History:  Diagnosis Date   Allergy Childhod   Seasonal   Arrhythmia    Arthritis    Atrial fibrillation (HCC)    Cancer (HCC)    Constipation    chronic   COPD (chronic obstructive pulmonary disease) (HCC)    Emphysema of lung (HCC)    GERD (gastroesophageal reflux disease) ?   Occasional   History of radiation therapy    Right lung- 09/22/23-09/29/23- Dr. Lynwood Nasuti   Hyperlipidemia    Hypertension    pulmonary   OSA on CPAP    Osteoarthritis    Osteoporosis    Peripheral neuropathy    Pulmonary hypertension (HCC)    RLS (restless legs syndrome)    Sleep apnea     Past Surgical History:  Procedure Laterality Date   ABDOMINAL AORTOGRAM W/LOWER EXTREMITY N/A 03/21/2020   Procedure: ABDOMINAL AORTOGRAM W/ Bilateral LOWER EXTREMITY [place];  Surgeon: Darron Deatrice LABOR, MD;  Location: [facility]VAS[facility];  Service: Cardiovascular;  Laterality: N/A;   ABDOMINAL HYSTERECTOMY Bilateral    arm surgery Right    Broken arm and has a plate in it   CARDIOVERSION N/A 03/12/2023   Procedure: CARDIOVERSION;  Surgeon: Shlomo Wilbert SAUNDERS, MD;  Location: [facility] INVASIVE CV LAB;  Service: Cardiovascular;  Laterality: N/A;   CATARACT EXTRACTION Bilateral    COLONOSCOPY     More than 10 years ago [facility]   EYE SURGERY  1996(?)   Cataract removal   FRACTURE SURGERY  2014   Proximal humerus - plate inserted   HERNIA REPAIR  08/2022   KNEE ARTHROSCOPY Right    PERIPHERAL VASCULAR INTERVENTION Bilateral 03/21/2020   Procedure:  PERIPHERAL VASCULAR INTERVENTION;  Surgeon: Darron Deatrice LABOR, MD;  Location: [facility]VAS[facility] CV [facility];  Service: Cardiovascular;  Laterality: Bilateral;  external iliac   TONSILLECTOMY     ventral herniorrhaphy  2023   mesh used    Social History:  reports that she quit smoking about 32 years ago. Her smoking use included cigarettes. She started smoking about 69 years ago. She has a 55.5 pack-year smoking history. She has never used smokeless tobacco. She reports current alcohol  use of about 7.0 standard drinks of alcohol  per week. She reports that she does not use drugs.  Allergies: Allergies  Allergen Reactions   Lovenox [Enoxaparin Sodium] Hives, Rash and Other (See Comments)    PT STATES SHE BROKE OUT IN A RASH HEAD TO TOE AND LASTED ABOUT 3 WEEKS    Metoprolol  Succinate [Metoprolol ] Other (See Comments)    Muscle aches, hand pain, and tingling    Family History:  Family History  Problem Relation Age of Onset   Alcohol  abuse Mother    Suicidality Father    Alcohol  abuse Father    Early death Father    Stroke Maternal Grandfather    Hypertension Sister    Colon cancer Neg Hx    Esophageal cancer Neg Hx    Pancreatic cancer Neg Hx    Stomach  cancer Neg Hx    Liver disease Neg Hx      Current Outpatient Medications:    acetaminophen  (TYLENOL ) 325 MG tablet, Take 325-650 mg by mouth every 6 (six) hours as needed for mild pain (pain score 1-3), headache or fever., Disp: , Rfl:    albuterol  (VENTOLIN  HFA) 108 (90 Base) MCG/ACT inhaler, INHALE 2 PUFFS BY MOUTH EVERY 6 HOURS AS NEEDED FOR WHEEZING OR FOR SHORTNESS OF BREATH, Disp: 18 g, Rfl: 6   apixaban  (ELIQUIS ) 5 MG TABS tablet, Take 1 tablet (5 mg total) by mouth 2 (two) times daily., Disp: 180 tablet, Rfl: 1   bisoprolol  (ZEBETA ) 10 MG tablet, TAKE 1 TABLET BY MOUTH AT BEDTIME, Disp: 90 tablet, Rfl: 3   Cholecalciferol (VITAMIN D3 MAXIMUM STRENGTH) 125 MCG (5000 UT) capsule, Take 5,000 Units by mouth daily., Disp: , Rfl:     clonazePAM  (KLONOPIN ) 0.5 MG tablet, Take 0.5 tablets (0.25 mg total) by mouth at bedtime as needed (for restless legs)., Disp: 30 tablet, Rfl: 5   docusate sodium  (COLACE) 100 MG capsule, Take 300 mg by mouth daily as needed for moderate constipation., Disp: , Rfl:    dofetilide  (TIKOSYN ) 250 MCG capsule, TAKE 1 CAPSULE BY MOUTH 2 TIMES A DAY., Disp: 180 capsule, Rfl: 2   famotidine  (PEPCID ) 20 MG tablet, TAKE 1 TABLET BY MOUTH 2 TIMES A DAY FOR 14 DAYS, Disp: 28 tablet, Rfl: 0   furosemide  (LASIX ) 20 MG tablet, Take 1 tablet (20 mg total) by mouth 3 (three) times a week AND 2 tablets (40 mg total) 4 (four) times a week. Take 20 mg by mouth in the morning on Mon/Wed/Fri and 40 mg on Sun/Tues/Thurs/Sat., Disp: , Rfl:    irbesartan  (AVAPRO ) 150 MG tablet, Take 1 tablet (150 mg total) by mouth in the morning and at bedtime., Disp: 180 tablet, Rfl: 3   MAGNESIUM  OXIDE PO, Take 1 tablet by mouth daily., Disp: , Rfl:    Misc Natural Products (COLON CLEANSE) CAPS, Take 4 capsules by mouth daily as needed (constipation)., Disp: , Rfl:    Multiple Vitamins-Minerals (PRESERVISION AREDS 2) CAPS, Take 1 capsule by mouth 2 (two) times daily., Disp: , Rfl:    polyethylene glycol (MIRALAX  / GLYCOLAX ) 17 g packet, Take 17 g by mouth daily as needed for moderate constipation (MIX AS DIRECTED)., Disp: , Rfl:    rOPINIRole  (REQUIP ) 0.5 MG tablet, TAKE 1 TABLET BY MOUTH EVERY MORNING AND TAKE 1 TABLET BY MOUTH AFTER 3PM AND TAKE 1 TABLET AFTER DINNER 8PM, Disp: 90 tablet, Rfl: 5   rosuvastatin  (CRESTOR ) 20 MG tablet, Take 1 tablet (20 mg total) by mouth daily., Disp: 90 tablet, Rfl: 3   spironolactone  (ALDACTONE ) 25 MG tablet, Take 0.5 tablets (12.5 mg total) by mouth daily., Disp: 45 tablet, Rfl: 3   STIOLTO RESPIMAT  2.5-2.5 MCG/ACT AERS, USE 2 INHALATIONS ORALLY   EVERY MORNING, Disp: 12 g, Rfl: 3 No current facility-administered medications for this visit.  Facility-Administered Medications Ordered in Other Visits:     technetium tetrofosmin  (TC-MYOVIEW ) injection 31.9 millicurie, 31.9 millicurie, Intravenous, Once PRN, Raford Riggs, MD  Review of Systems:  Negative unless indicated in HPI.   Physical Exam: Vitals:   07/05/24 1517  BP: 120/78  Pulse: 73  Temp: (!) 97.4 F (36.3 C)  TempSrc: Oral  SpO2: 96%  Weight: 135 lb 12.8 oz (61.6 kg)    Body mass index is 24.84 kg/m.    Impression and Plan:  Hives -  Ambulatory referral to Allergy  -Agree with allergy referral.  She can increase Zyrtec to twice daily if needed.   Time spent:22 minutes reviewing chart, interviewing and examining patient and formulating plan of care.     Tully [name], MD [facility]

## 2024-07-06 ENCOUNTER — Encounter: Payer: Self-pay | Admitting: Internal Medicine

## 2024-07-27 ENCOUNTER — Encounter: Payer: Self-pay | Admitting: Emergency Medicine

## 2024-07-27 ENCOUNTER — Ambulatory Visit: Admitting: Emergency Medicine

## 2024-07-27 VITALS — BP 118/62 | HR 58 | Ht 62.0 in | Wt 140.6 lb

## 2024-07-27 DIAGNOSIS — G4739 Other sleep apnea: Secondary | ICD-10-CM | POA: Diagnosis not present

## 2024-07-27 DIAGNOSIS — R911 Solitary pulmonary nodule: Secondary | ICD-10-CM | POA: Diagnosis not present

## 2024-07-27 DIAGNOSIS — J439 Emphysema, unspecified: Secondary | ICD-10-CM

## 2024-07-27 NOTE — Assessment & Plan Note (Signed)
 Please continue to use your CPAP every night.  We confirmed good compliance and good clinical benefit today.

## 2024-07-27 NOTE — Assessment & Plan Note (Signed)
 Continue your Stiolto 2 puffs once daily Keep your albuterol  available to use 2 puffs when needed for shortness of breath, chest tightness, wheezing. Get the flu shot this fall Recommend that you also get the COVID-19 vaccine and the RSV vaccine Follow in our office in 6 months.  Please call sooner if you have any problems.

## 2024-07-27 NOTE — Assessment & Plan Note (Signed)
 With empiric SBRT.  Following serial imaging.  Next scan in the coming weeks.  Then follow with radiation oncology

## 2024-07-27 NOTE — Patient Instructions (Addendum)
 Please continue to use your CPAP every night.  We confirmed good compliance and good clinical benefit today. Continue your Stiolto 2 puffs once daily Keep your albuterol  available to use 2 puffs when needed for shortness of breath, chest tightness, wheezing. Get the flu shot this fall Recommend that you also get the COVID-19 vaccine and the RSV vaccine Get your repeat CT scan of the chest as planned. Follow-up with Dr. Shannon as planned Follow in our office in 6 months.  Please call sooner if you have any problems.

## 2024-07-27 NOTE — Progress Notes (Signed)
 Subjective:    Patient ID: Holly Benson, female    DOB: 06-22-1933, 89 y.o.   MRN: [id number]  COPD She complains of cough and shortness of breath. There is no wheezing. Pertinent negatives include no ear pain, fever, headaches, postnasal drip, rhinorrhea, sneezing, sore throat or trouble swallowing. Her past medical history is significant for COPD.  Shortness of Breath Pertinent negatives include no ear pain, fever, headaches, leg swelling, rash, rhinorrhea, sore throat, vomiting or wheezing. Her past medical history is significant for COPD.   ROV 07/30/2023 --follow-up visit for 89 year old woman with history of severe COPD on Stiolto.  She has a slowly enlarging pulmonary nodule that we have followed with serial imaging and which is PET positive.  PMH also significant for A-fib, GERD, OSA/OHS for which she uses her servo ventilator, chronic rhinitis and GERD with chronic cough.  We have been following her pulmonary nodule conservatively.  Most recent CT chest as below On stiolto, albuterol  once a day, does help her.   CT scan of the chest 07/14/2023 reviewed by me, shows continued slow increase in size in her posterior right upper lobe pulmonary nodule, now 13 x 11 mm.  No mediastinal or hilar adenopathy.  ROV 02/10/24 --89 year old woman with a history of severe COPD.  Past medical history also significant for A-fib, GERD, OSA/OHS for which she uses servo ventilator, chronic cough with GERD and chronic rhinitis.  She has a slowly enlarging pulmonary nodule in the right upper lobe that we followed on serial imaging, underwent empiric SBRT, completed about 4 months ago. She is dealing with some progressive exertional SOB over the last 3-4 months. She had Flu A, hospitalized for COPD flare. She has been a bit slow to bounce back. She is on stiolto. Uses albuterol  rarely right now. She feels that she has some increased edema, on lasix . Off flonase  right now.   CT chest 01/29/2024 reviewed by me,  shows some biapical scarring, volume loss in the posterior right upper lobe progressive since 11/20/2023, presumed radiation change.  Multiple left upper lobe pulmonary nodule similar to smaller than prior  ROV 07/27/2024 --Holly Benson is 26 with a history of severe COPD, OSA/OHS, chronic respiratory failure for which she has been on servo ventilator but had become less compliant - now on CPAP.  Her history is also significant for A-fib, GERD, chronic cough, chronic rhinitis and a slowly enlarging pulmonary nodule in the right upper lobe which we ultimately treated empirically with SBRT.  Most recent CT chest was 01/2024 that shows some radiation change as well as multiple left upper lobe nodules that are similar to smaller than prior. Due for repeat CT chest soon. Not on O2.  Today she reports good compliance with her CPAP at night - using reliably for the last 3 months. She does feel good clinical benefit. She continues to have stable significant exertional SOB. On Stiolto, uses albuterol  0-2x day - can go days without using it. Minimal cough, throat mucous. She has PND, hives. She is on zyrtec bid, is planning to allergist. Rare GERD. No abx or flares. She has an oximeter, has not seen any desats.    Review of Systems  Constitutional:  Negative for fever and unexpected weight change.  HENT:  Negative for congestion, dental problem, ear pain, nosebleeds, postnasal drip, rhinorrhea, sinus pressure, sneezing, sore throat and trouble swallowing.   Eyes:  Negative for redness and itching.  Respiratory:  Positive for cough and shortness of breath. Negative for  chest tightness and wheezing.   Cardiovascular:  Negative for palpitations and leg swelling.  Gastrointestinal:  Negative for nausea and vomiting.  Genitourinary:  Negative for dysuria.  Musculoskeletal:  Negative for joint swelling.  Skin:  Negative for rash.  Neurological:  Negative for headaches.  Hematological:  Does not bruise/bleed easily.   Psychiatric/Behavioral:  Negative for dysphoric mood. The patient is not nervous/anxious.        Objective:   Physical Exam Vitals:   07/27/24 0851  BP: 118/62  Pulse: (!) 58  SpO2: 97%  Weight: 140 lb 9.6 oz (63.8 kg)  Height: 5' 2 (1.575 m)   Gen: Pleasant, thin woman, in no distress,  normal affect, mild kyphosis  ENT: No lesions,  mouth clear,  oropharynx clear, no postnasal drip  Neck: No JVD, no stridor  Lungs: No use of accessory muscles, very soft bilateral expiratory wheezes.  Overall good air movement  Cardiovascular: RRR, heart sounds normal, no murmur or gallops, no peripheral edema  Musculoskeletal: joints of hands swollen, nodular  Neuro: alert, non focal  Skin: Warm, no lesions or rashes      Assessment & Plan:  COPD (chronic obstructive pulmonary disease) (HCC) Continue your Stiolto 2 puffs once daily Keep your albuterol  available to use 2 puffs when needed for shortness of breath, chest tightness, wheezing. Get the flu shot this fall Recommend that you also get the COVID-19 vaccine and the RSV vaccine Follow in our office in 6 months.  Please call sooner if you have any problems.  Complex sleep apnea syndrome Please continue to use your CPAP every night.  We confirmed good compliance and good clinical benefit today.   Lung nodule With empiric SBRT.  Following serial imaging.  Next scan in the coming weeks.  Then follow with radiation oncology       Benson Chris, MD, PhD 07/27/2024, 9:26 AM [facility] [phone] or if no answer [phone]

## 2024-07-28 ENCOUNTER — Ambulatory Visit (INDEPENDENT_AMBULATORY_CARE_PROVIDER_SITE_OTHER): Admitting: Allergy & Immunology

## 2024-07-28 ENCOUNTER — Encounter: Payer: Self-pay | Admitting: Allergy & Immunology

## 2024-07-28 ENCOUNTER — Other Ambulatory Visit: Payer: Self-pay

## 2024-07-28 VITALS — BP 130/70 | HR 69 | Temp 97.9°F | Resp 16 | Ht 62.6 in | Wt 137.9 lb

## 2024-07-28 DIAGNOSIS — L508 Other urticaria: Secondary | ICD-10-CM

## 2024-07-28 DIAGNOSIS — J449 Chronic obstructive pulmonary disease, unspecified: Secondary | ICD-10-CM

## 2024-07-28 NOTE — Progress Notes (Signed)
 NEW PATIENT  Date of Service/Encounter:  07/28/24  Consult requested by: [name], [name], MD   Assessment:   Chronic urticaria - getting labs to look for serious causes of hives   Chronic obstructive pulmonary disease, - followed by Dr. Shelah  Atrial fibrillation - on Eliquis   Son-in-law owns [employer]  Plan/Recommendations:   1. Chronic urticaria - Your history does not have any red flags such as fevers, joint pains, or permanent skin changes that would be concerning for a more serious cause of hives.  - We will get some labs to rule out serious causes of hives: alpha gal syndrome, complete blood count, tryptase level, chronic urticaria panel, CMP, ESR, and CRP. - We are going to get an environmental allergy  panel as well as the most common food allergens.  - Chronic hives are often times a self limited process and will burn themselves out over 6-12 months, although this is not always the case.  - In the meantime, start suppressive dosing of antihistamines:   - Morning:  Zyrtec (cetirizine) 10mg  + Pepcid  (famotidine ) 20mg   - Evening: Zyrtec (cetirizine) 10mg  + Pepcid  (famotidine ) 20mg  - You can change this dosing at home, decreasing the dose as needed or increasing the dosing as needed.  - If you are not tolerating the medications or are tired of taking them every day, we can start treatment with either Dupixent or Xolair (injectable medications that can treat hives/itching).   2. Chronic obstructive pulmonary disease - This seems to be well-managed. - We are not going to make any medications changes.  - Dr. Shelah is doing an excellent job with management.   3. Return in about 2 months (around 09/27/2024). You can have the follow up appointment with Dr. Iva or a Nurse Practicioner (our Nurse Practitioners are excellent and always have Physician oversight!).    This note in its entirety was forwarded to the Provider who requested this  consultation.  Subjective:   Holly Benson is a 89 y.o. female presenting today for evaluation of  Chief Complaint  Patient presents with   Urticaria    Has gotton worst over several months since June.    Holly Benson has a history of the following: Patient Active Problem List   Diagnosis Date Noted   Sleep related hypoxia 03/28/2024   ASV (adaptive servo-ventilation) use counseling 02/18/2024   Influenza A 12/07/2023   History of CAD (coronary artery disease) 11/21/2023   History of lung cancer 11/21/2023   Chronic diastolic CHF (congestive heart failure) (HCC) 11/21/2023   Pulmonary hypertension (HCC) 11/21/2023   Acute exacerbation of CHF (congestive heart failure) (HCC) 11/21/2023   Acute hypoxemic respiratory failure (HCC) 11/20/2023   Lung nodule 08/31/2023   Osteoporosis 07/22/2022   Persistent atrial fibrillation (HCC) 02/18/2022   Hypercoagulable state due to persistent atrial fibrillation (HCC) 05/17/2021   Abnormal CT of the chest 03/15/2021   Pneumonia 01/18/2021   PVD (peripheral vascular disease) 08/01/2020   Chronic anticoagulation 08/01/2020   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  Discussed the use of AI scribe software for clinical note transcription with the patient and/or guardian, who gave verbal consent to proceed.  Holly Benson was referred by [name], [name], MD.      Holly Benson is a 89 y.o. female presenting for an evaluation of urticaria.  She has experienced chronic hives for five to six years, initially presenting as sporadic spots. Since May, the hives have become continuous, primarily affecting her chest and upper arms, with intense itching described as 'down to the bones'. Each episode lasts three to four days.  She has been treated with a prednisone  shot and a regimen of prednisone  without relief. Over-the-counter cortisone cream provides temporary relief for a few hours. She takes Zyrtec twice daily, but it has not prevented the hives. A two-week course of Pepcid  showed no improvement.  She has not identified specific food triggers but is on a gluten-free diet, which improves her overall well-being. It has not seemed to have changed the frequency of her hives however. She tolerates eggs and cheese but experiences gastrointestinal upset with milk and ice cream. She has not tried changing her diet otherwise.   She has been trying some homeopathic medications that her PA-trained daughter recommended; these have not helped at all, but she is continuing to take them anyway. They are a variety of oils.  Her past medical history includes COPD, managed with Stiolto and occasional albuterol  inhaler use, and atrial fibrillation, managed with Eliquis . She has undergone cardioversion in the past. No recent medication changes related to the hives.  She has a history of smoking and experiences shortness of breath unrelated to seasonal changes. She also has arthritis and neuropathy in her fingers, with significant numbness in two fingers, but does not take medication for these conditions.   Otherwise, there is no history of other atopic diseases, including drug allergies, stinging insect allergies, or contact dermatitis. There is no significant infectious history. Vaccinations are up to date.    Past Medical History: Patient Active Problem List   Diagnosis Date Noted    Sleep related hypoxia 03/28/2024   ASV (adaptive servo-ventilation) use counseling 02/18/2024   Influenza A 12/07/2023   History of CAD (coronary artery disease) 11/21/2023   History of lung cancer 11/21/2023   Chronic diastolic CHF (congestive heart failure) (HCC) 11/21/2023   Pulmonary hypertension (HCC) 11/21/2023   Acute exacerbation of CHF (congestive heart failure) (HCC) 11/21/2023   Acute hypoxemic respiratory failure (HCC) 11/20/2023   Lung nodule 08/31/2023   Osteoporosis 07/22/2022   Persistent atrial fibrillation (HCC) 02/18/2022   Hypercoagulable state due to persistent atrial fibrillation (HCC) 05/17/2021   Abnormal CT of the chest 03/15/2021   Pneumonia 01/18/2021   PVD (peripheral vascular disease) 08/01/2020   Chronic anticoagulation 08/01/2020   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   Iron deficiency anemia due to chronic blood loss 07/30/2020   RLS (restless legs syndrome) 07/30/2020   Typical atrial flutter (HCC) 01/12/2018   Cardiomyopathy (HCC) 11/10/2017   Shortness of breath 11/10/2017   Heart palpitations 09/10/2017   PAF (paroxysmal atrial fibrillation) (HCC) 09/10/2017   Essential hypertension 09/10/2017   Mixed hyperlipidemia 09/10/2017   COPD (chronic obstructive pulmonary disease) (HCC) 07/21/2017   Cough 07/21/2017    Medication List:  Allergies as of 07/28/2024       Reactions   Lovenox [enoxaparin Sodium] Hives, Rash, Other (See Comments)  PT STATES SHE BROKE OUT IN A RASH HEAD TO TOE AND LASTED ABOUT 3 WEEKS    Metoprolol  Succinate [metoprolol ] Other (See Comments)   Muscle aches, hand pain, and tingling        Medication List        Accurate as of July 28, 2024 11:56 AM. If you have any questions, ask your nurse or doctor.          acetaminophen  325 MG tablet Commonly known as: TYLENOL  Take 325-650 mg by mouth every 6 (six) hours as needed for mild pain (pain score 1-3),  headache or fever.   albuterol  108 (90 Base) MCG/ACT inhaler Commonly known as: VENTOLIN  HFA INHALE 2 PUFFS BY MOUTH EVERY 6 HOURS AS NEEDED FOR WHEEZING OR FOR SHORTNESS OF BREATH   apixaban  5 MG Tabs tablet Commonly known as: Eliquis  Take 1 tablet (5 mg total) by mouth 2 (two) times daily.   bisoprolol  10 MG tablet Commonly known as: ZEBETA  TAKE 1 TABLET BY MOUTH AT BEDTIME   clonazePAM  0.5 MG tablet Commonly known as: KLONOPIN  Take 0.5 tablets (0.25 mg total) by mouth at bedtime as needed (for restless legs).   Colon Cleanse Caps Take 4 capsules by mouth daily as needed (constipation).   docusate sodium  100 MG capsule Commonly known as: COLACE Take 300 mg by mouth daily as needed for moderate constipation.   dofetilide  250 MCG capsule Commonly known as: TIKOSYN  TAKE 1 CAPSULE BY MOUTH 2 TIMES A DAY.   famotidine  20 MG tablet Commonly known as: PEPCID  TAKE 1 TABLET BY MOUTH 2 TIMES A DAY FOR 14 DAYS   furosemide  20 MG tablet Commonly known as: LASIX  Take 1 tablet (20 mg total) by mouth 3 (three) times a week AND 2 tablets (40 mg total) 4 (four) times a week. Take 20 mg by mouth in the morning on Mon/Wed/Fri and 40 mg on Sun/Tues/Thurs/Sat.   irbesartan  150 MG tablet Commonly known as: AVAPRO  Take 1 tablet (150 mg total) by mouth in the morning and at bedtime.   MAGNESIUM  OXIDE PO Take 1 tablet by mouth daily.   polyethylene glycol 17 g packet Commonly known as: MIRALAX  / GLYCOLAX  Take 17 g by mouth daily as needed for moderate constipation (MIX AS DIRECTED).   PreserVision AREDS 2 Caps Take 1 capsule by mouth 2 (two) times daily.   rOPINIRole  0.5 MG tablet Commonly known as: REQUIP  TAKE 1 TABLET BY MOUTH EVERY MORNING AND TAKE 1 TABLET BY MOUTH AFTER 3PM AND TAKE 1 TABLET AFTER DINNER 8PM   rosuvastatin  20 MG tablet Commonly known as: CRESTOR  Take 1 tablet (20 mg total) by mouth daily.   spironolactone  25 MG tablet Commonly known as: ALDACTONE  Take 0.5  tablets (12.5 mg total) by mouth daily.   Stiolto Respimat  2.5-2.5 MCG/ACT Aers Generic drug: Tiotropium Bromide -Olodaterol USE 2 INHALATIONS ORALLY   EVERY MORNING   Vitamin D3 Maximum Strength 125 MCG (5000 UT) capsule Generic drug: Cholecalciferol Take 5,000 Units by mouth daily.        Birth History: non-contributory  Developmental History: non-contributory  Past Surgical History: Past Surgical History:  Procedure Laterality Date   ABDOMINAL AORTOGRAM W/LOWER EXTREMITY N/A 03/21/2020   Procedure: ABDOMINAL AORTOGRAM W/ Bilateral LOWER EXTREMITY [place];  Surgeon: Darron Deatrice LABOR, MD;  Location: [facility];  Service: Cardiovascular;  Laterality: N/A;   ABDOMINAL HYSTERECTOMY Bilateral    arm surgery Right    Broken arm and has a plate in it   CARDIOVERSION N/A  03/12/2023   Procedure: CARDIOVERSION;  Surgeon: Shlomo Wilbert SAUNDERS, MD;  Location: [facility];  Service: Cardiovascular;  Laterality: N/A;   CATARACT EXTRACTION Bilateral    COLONOSCOPY     More than 10 years ago In [facility]   EYE SURGERY  1996(?)   Cataract removal   FRACTURE SURGERY  2014   Proximal humerus - plate inserted   HERNIA REPAIR  08/2022   KNEE ARTHROSCOPY Right    PERIPHERAL VASCULAR INTERVENTION Bilateral 03/21/2020   Procedure: PERIPHERAL VASCULAR INTERVENTION;  Surgeon: Darron Deatrice LABOR, MD;  Location: [facility];  Service: Cardiovascular;  Laterality: Bilateral;  external iliac   TONSILLECTOMY     ventral herniorrhaphy  2023   mesh used     Family History: Family History  Problem Relation Age of Onset   Alcohol  abuse Mother    Suicidality Father    Alcohol  abuse Father    Early death Father    Stroke Maternal Grandfather    Hypertension Sister    Colon cancer Neg Hx    Esophageal cancer Neg Hx    Pancreatic cancer Neg Hx    Stomach cancer Neg Hx    Liver disease Neg Hx      Social History: Shamiah lives at home with her daughter and son-in-law.  She  lives in a house that is 80 years old.  There is laminate and throw rugs throughout the home.  She has a heat pump for heating and cooling.  There is 1 dog in the home.  There are no dust mite covers in the bedding.  There is no tobacco exposure.  She is currently retired.  There is no fume, chemical, or dust exposure.  She did smoke but stopped in December 1992.  She smoked for 37 years total.    Review of systems otherwise negative other than that mentioned in the HPI.    Objective:   Blood pressure 130/70, pulse 69, temperature 97.9 F (36.6 C), temperature source Temporal, resp. rate 16, height 5' 2.6 (1.59 m), weight 137 lb 14.4 oz (62.6 kg), SpO2 94%. Body mass index is 24.74 kg/m.     Physical Exam Vitals reviewed.  Constitutional:      Appearance: She is well-developed.  HENT:     Head: Normocephalic and atraumatic.     Right Ear: Tympanic membrane, ear canal and external ear normal. No drainage, swelling or tenderness. Tympanic membrane is not injected, scarred, erythematous, retracted or bulging.     Left Ear: Tympanic membrane, ear canal and external ear normal. No drainage, swelling or tenderness. Tympanic membrane is not injected, scarred, erythematous, retracted or bulging.     Nose: No nasal deformity, septal deviation, mucosal edema or rhinorrhea.     Right Turbinates: Enlarged, swollen and pale.     Left Turbinates: Enlarged, swollen and pale.     Right Sinus: No maxillary sinus tenderness or frontal sinus tenderness.     Left Sinus: No maxillary sinus tenderness or frontal sinus tenderness.     Mouth/Throat:     Mouth: Mucous membranes are not pale and not dry.     Pharynx: Uvula midline.  Eyes:     General:        Right eye: No discharge.        Left eye: No discharge.     Conjunctiva/sclera: Conjunctivae normal.     Right eye: Right conjunctiva is not injected. No chemosis.    Left eye: Left conjunctiva is  not injected. No chemosis.    Pupils: Pupils are  equal, round, and reactive to light.  Cardiovascular:     Rate and Rhythm: Normal rate and regular rhythm.     Heart sounds: Normal heart sounds.  Pulmonary:     Effort: Pulmonary effort is normal. No tachypnea, accessory muscle usage or respiratory distress.     Breath sounds: Normal breath sounds. No wheezing, rhonchi or rales.  Chest:     Chest wall: No tenderness.  Abdominal:     Tenderness: There is no abdominal tenderness. There is no guarding or rebound.  Lymphadenopathy:     Head:     Right side of head: No submandibular, tonsillar or occipital adenopathy.     Left side of head: No submandibular, tonsillar or occipital adenopathy.     Cervical: No cervical adenopathy.  Skin:    Coloration: Skin is not pale.     Findings: No abrasion, erythema, petechiae or rash. Rash is not papular, urticarial or vesicular.     Comments: No urticaria appreciated.   Neurological:     Mental Status: She is alert.  Psychiatric:        Behavior: Behavior is cooperative.      Diagnostic studies: labs sent instead        Marty Shaggy, MD [facility] 

## 2024-07-28 NOTE — Patient Instructions (Addendum)
 1. Chronic urticaria - Your history does not have any red flags such as fevers, joint pains, or permanent skin changes that would be concerning for a more serious cause of hives.  - We will get some labs to rule out serious causes of hives: alpha gal syndrome, complete blood count, tryptase level, chronic urticaria panel, CMP, ESR, and CRP. - We are going to get an environmental allergy  panel as well as the most common food allergens.  - Chronic hives are often times a self limited process and will burn themselves out over 6-12 months, although this is not always the case.  - In the meantime, start suppressive dosing of antihistamines:   - Morning:  Zyrtec (cetirizine) 10mg  + Pepcid  (famotidine ) 20mg   - Evening: Zyrtec (cetirizine) 10mg  + Pepcid  (famotidine ) 20mg  - You can change this dosing at home, decreasing the dose as needed or increasing the dosing as needed.  - If you are not tolerating the medications or are tired of taking them every day, we can start treatment with either Dupixent or Xolair (injectable medications that can treat hives/itching).   2. Chronic obstructive pulmonary disease - This seems to be well-managed. - We are not going to make any medications changes.   3. Return in about 2 months (around 09/27/2024). You can have the follow up appointment with Dr. Iva or a Nurse Practicioner (our Nurse Practitioners are excellent and always have Physician oversight!).    Please inform us  of any Emergency Department visits, hospitalizations, or changes in symptoms. Call us  before going to the ED for breathing or allergy  symptoms since we might be able to fit you in for a sick visit. Feel free to contact us  anytime with any questions, problems, or concerns.  It was a pleasure to meet you today!  Websites that have reliable patient information: 1. American Academy of Asthma, Allergy , and Immunology: www.aaaai.org 2. Food Allergy  Research and Education (FARE):  foodallergy.org 3. Mothers of Asthmatics: http://www.asthmacommunitynetwork.org 4. American College of Allergy , Asthma, and Immunology: www.acaai.org      "Like" us  on Facebook and Instagram for our latest updates!      A healthy democracy works best when [employer] participate! Make sure you are registered to vote! If you have moved or changed any of your contact information, you will need to get this updated before voting! Scan the QR codes below to learn more!

## 2024-07-29 DIAGNOSIS — H43813 Vitreous degeneration, bilateral: Secondary | ICD-10-CM | POA: Diagnosis not present

## 2024-07-29 DIAGNOSIS — H353123 Nonexudative age-related macular degeneration, left eye, advanced atrophic without subfoveal involvement: Secondary | ICD-10-CM | POA: Diagnosis not present

## 2024-07-29 DIAGNOSIS — H353114 Nonexudative age-related macular degeneration, right eye, advanced atrophic with subfoveal involvement: Secondary | ICD-10-CM | POA: Diagnosis not present

## 2024-08-01 NOTE — Progress Notes (Unsigned)
 Cardiology Clinic Note   Patient Name: Holly Benson Date of Encounter: 08/01/2024  Primary Care Provider:  [name], [name], MD Primary Cardiologist:  Vinie JAYSON Maxcy, MD  Patient Profile    Holly Benson 89 year old female presents the clinic today for follow-up evaluation of her atrial fibrillation and shortness of breath.  Past Medical History    Past Medical History:  Diagnosis Date   Allergy  Childhod   Seasonal   Arrhythmia    Arthritis    Atrial fibrillation (HCC)    Cancer (HCC)    Constipation    chronic   COPD (chronic obstructive pulmonary disease) (HCC)    Emphysema of lung (HCC)    GERD (gastroesophageal reflux disease) ?   Occasional   History of radiation therapy    Right lung- 09/22/23-09/29/23- Dr. Lynwood Nasuti   Hyperlipidemia    Hypertension    pulmonary   OSA on CPAP    Osteoarthritis    Osteoporosis    Peripheral neuropathy    Pulmonary hypertension (HCC)    RLS (restless legs syndrome)    Sleep apnea    Past Surgical History:  Procedure Laterality Date   ABDOMINAL AORTOGRAM W/LOWER EXTREMITY N/A 03/21/2020   Procedure: ABDOMINAL AORTOGRAM W/ Bilateral LOWER EXTREMITY [place];  Surgeon: Darron Deatrice LABOR, MD;  Location: [facility];  Service: Cardiovascular;  Laterality: N/A;   ABDOMINAL HYSTERECTOMY Bilateral    arm surgery Right    Broken arm and has a plate in it   CARDIOVERSION N/A 03/12/2023   Procedure: CARDIOVERSION;  Surgeon: Shlomo Wilbert SAUNDERS, MD;  Location: [facility];  Service: Cardiovascular;  Laterality: N/A;   CATARACT EXTRACTION Bilateral    COLONOSCOPY     More than 10 years ago In [facility]   EYE SURGERY  1996(?)   Cataract removal   FRACTURE SURGERY  2014   Proximal humerus - plate inserted   HERNIA REPAIR  08/2022   KNEE ARTHROSCOPY Right    PERIPHERAL VASCULAR INTERVENTION Bilateral 03/21/2020   Procedure: PERIPHERAL VASCULAR INTERVENTION;  Surgeon: Darron Deatrice LABOR, MD;   Location: [facility];  Service: Cardiovascular;  Laterality: Bilateral;  external iliac   TONSILLECTOMY     ventral herniorrhaphy  2023   mesh used    Allergies  Allergies  Allergen Reactions   Lovenox [Enoxaparin Sodium] Hives, Rash and Other (See Comments)    PT STATES SHE BROKE OUT IN A RASH HEAD TO TOE AND LASTED ABOUT 3 WEEKS    Metoprolol  Succinate [Metoprolol ] Other (See Comments)    Muscle aches, hand pain, and tingling    History of Present Illness      Holly Benson has a PMH of hypertension, atrial flutter, hyperlipidemia, obstructive sleep apnea, COPD, peripheral arterial disease, and atrial fibrillation.  CHA2DS2-VASc score 5 on Eliquis .   She was  seen by Quita Kicks, PA-C on 05/17/2021.  Of note she was previously on amiodarone  which was discontinued due to lung toxicity.  When she was seen in the atrial fibrillation clinic she described a frequent fluttering type sensation which would last for 10 to 15 minutes and happen multiple times per day.  She was started on diltiazem  30 mg daily.     She contacted the nurse triage line 07/11/2021.  Her metoprolol  was transitioned to carvedilol .  With metoprolol  she did notice tingling and numbness with an icy type pain in her thumb and first 2 fingers on each hand.   She  presented to the clinic 07/15/2021 for follow-up evaluation stated she was out of town for several days and noticed that her blood pressure was low.  She reported that she stopped taking her afternoon dose of hydralazine  and did not notice much change with her blood pressure.  She discontinued her morning dose of hydralazine  as well.  She noticed a pain in her hands and tingling with metoprolol .  She also noticed some occasional ankle edema.  I changed her metoprolol  to carvedilol , gave her the [place] support stocking sheet, discontinued her hydralazine , gave her the salty 6 diet sheet, asked her to maintain her physical activity, and planned follow-up in 1 to  2 months.  Have asked her to maintain a blood pressure log.   She reported that she contacted the office earlier last week for follow-up appointment.  She was told that she would not be scheduled for an appointment with Dr. Mona for several months.  She contacted the patient advocate representatives with her complaint.  She was added onto my schedule today.   She followed up with [name] PA-C on 08/06/2021.  She had contacted the office with reports of increased palpitations.  She questioned if she was having more episodes of atrial fibrillation.  She presented to the clinic alone.  She reported that she did not want to distinguish between PACs and paroxysmal atrial fibrillation.  She noted palpitations at least once daily that would last for around 30 minutes.  She denied dizziness, presyncope, syncope with the episodes.  She had not taken her as needed Cardizem .  She was maintaining a blood pressure log.  Her systolic blood pressures were in the 80- 100s.  She denied bleeding issues.  She was tolerating carvedilol  well.   She presented to the clinic 10/08/2021 for follow-up evaluation stated over the last week she had noticed increased shortness of breath and had has been taking her diltiazem  more regularly due to increased heart rate.  Her blood pressures had been slightly elevated.  In  clinic her heart rate was 88 with a blood pressure of 116/60.  She reported that she  had some increased phlegm and has just returned from seeing her family.  She had a call from her pharmacist who lowered her apixaban  dosing due to her weight.  She had regained weight.  I  increased her apixaban  dosing back to 5 mg twice daily.  I will ordered an echocardiogram, CBC, and scheduled follow-up for February.   She was seen by Jodie Passey, PA-C 10/10/2021.  During that time she reported that she was doing okay overall.  She noted shortness of breath and fatigue x10 days.  It was felt that it was likely related to her atrial  fibrillation.  Her heart rates at home were 80-1 100s.  Her weight was slightly increased and she noted more ankle swelling.  She was prescribed furosemide  20 mg as needed for lower extremity swelling.   She was seen by Dr. Niels 10/22/2021.  At that time she was interested in starting Tikosyn  in February because she was planning a trip to [place] for the month of January.  Her weight remains stable and her heart rate was rate controlled.   She presented to the clinic 12/12/21 for follow-up evaluation and states she had a nice trip to [place] .  She reported that someone told her to stop taking her diltiazem .  She had not taken the medication in around 1 and half months.  She reported that her  heart rate had been 90s and low 100s.  She felt that her shortness of breath was stable.  We reviewed the medication and recommendations for starting Tikosyn .  I have asked her to follow-up with the atrial fibrillation clinic, I prescribed 60 mg of extended release diltiazem , and plan follow-up for 3 to 4 months.  She was noted to have changes on CT scan noted in her lungs.  Her amiodarone  was stopped.  She underwent load of dofetilide .  She followed up with Dr. Inocencio 12/01/2022.  She denied chest pain and shortness of breath.  She denied dizziness presyncope and syncope.  She was tolerating medications well.  She denied further episodes of atrial fibrillation.  She underwent repair of ventral hernia and reported that her appetite was getting better.  Chest Vascor was noted to be 4.  She was continued on apixaban , metoprolol  and follow-up was planned for 6 months.  She presented to the clinic 01/01/2023 for follow-up evaluation and stated she had been noticing weakness, muscle aches, and shortness of breath over the last 6 weeks.  Her blood pressures had been elevated in the morning and stabilized throughout the day.  We reviewed her previous echocardiogram and last clinic visit.  She expressed understanding.  She  felt that she had recovered fairly well since having her hernia repair.  However, she had not been able to return to her baseline physical activity since her surgery.  She had been eating better and her weight was slightly increased.  I  ordered a CBC and echocardiogram.  Will plan follow-up in 1 month - 2 months.  She planned to travel to [facility] to see a glass blowing exhibit in the near future.  Echocardiogram 02/03/2023 showed normal EF and intermediate diastolic parameters.  CBC was stable.  She presented to the clinic 02/17/2023 for follow-up evaluation and stated her blood pressure had been better controlled.  She still had some weakness and fatigue.  She felt this may be related to her beta-blocker.  We discussed her previous medications and atrial fibrillation.  I explained that  EP would be able to offer recommendations during her appointment in 2 days.  She expressed understanding.  She enjoyed her trip to [place].  She had been somewhat physically active and continued to do yoga.  I refilled her amlodipine  and planned follow-up in 6 months.   She was seen in follow-up by Jodie Passey 02/19/2023.  Her atenolol  was discontinued and she was started on bisoprolol .  DCCV was also discussed.  She was seen for follow-up EKG on 03/05/2023.  Her symptoms continued.  Her EKG showed atrial flutter 94 bpm.  She was scheduled for DCCV on 03/12/2023.  On presentation for DCCV she was in sinus rhythm.  Her cardioversion was canceled.    She presented to the clinic 04/13/23 for follow-up evaluation and stated she continued to have fatigue, shortness of breath, and decreased endurance.  She was able to climb the stairs to her upstairs apartment and do all of her normal daily activities.  She reported that over the last 6 months she was not able to do elective walking and needed more frequent breaks.  Her EKG showed normal sinus rhythm incomplete right bundle branch block 60 bpm.  We again reviewed her most recent  echocardiogram and her visit with EP.  She was tolerating bisoprolol  well.  She was noted to have elevated BNP at 891.4 on 03/24/2023.  She did not have much increased urination with increased furosemide .  I increased her furosemide  to 40 mg 4 days/week and have her continue 20 mg 3 days/week.  I  ordered BMP in 2 weeks, gave [place] support stocking sheet, had her continue low-sodium diet, and recommended that she see pulmonology in follow-up for pulmonary function test.  I planned cardiology follow-up in 3 months.      She was seen in follow-up by Dr. Mona on 09/03/2023.  During that time she reported some worsening fatigue with exertion as well as dyspnea.  She noted that she had been told she had a spot on her lung that was felt to be lung cancer.  She had follow-up planned with Dr. Shannon.  Pulmonology did not feel that her lungs were contributing to her fatigue.  She denied intermittent atrial fibrillation.  Her EKG at that time did not show atrial fibrillation.  It was felt that her coronary disease may be contributing to her progressive dyspnea on exertion.  Coronary CTA was ordered.  FFR analysis showed no obstructive CAD.  She presented to the clinic 10/12/23 for follow-up evaluation and stated she had no change in her breathing.  She reported that she had an upper respiratory infection and radiation treatments.  These were hard on her.  She felt that she was recovering.  She continued to note difficulties with her breathing that she felt were related to her COPD.  She had been monitoring her watch for atrial fibrillation.  She reported that during her recent illness she did have a few episodes of brief periods of atrial fibrillation that were less than a minute and resolved without intervention.  We reviewed her coronary CT and FFR.  She expressed understanding.  She was tolerating her rosuvastatin  well.  I planned for repeat fasting lipids and LFTs in March and follow-up in 6 months.  She was  seen in follow-up by Dr. Liberty 01/12/24.  During that time it was noted that her bilateral external iliac stents were patent with borderline stenosis in the distal left external iliac artery.  It was felt that she could continue her therapy and plan for repeat evaluation in 1 year was discussed.  She was maintaining sinus rhythm.    She presented to the clinic 01/26/24 for follow-up evaluation and stated she was having a good day.  Her EKG showed sinus bradycardia 58 bpm.  She noted that when she was not taking statin medication she continued to have aches and low energy.  She reported that she started back her rosuvastatin  a couple days ago.  We reviewed her most recent lipid panel.  She expressed understanding.  We reviewed her February CBC.  She was noted to be slightly anemic.  I encouraged her to increase the iron in her diet.  We reviewed the importance of combining this with vitamin C to help with absorption.  She expressed understanding.  We reviewed her coronary CTA and echocardiograms.  She remained physically active going to the gym 3 days/week and was very active at home.  I asked her to maintain her physical activity, continue her medication regimen and planned follow-up in 4 to 6 months.  She was seen in follow-up by Dr. Mona on 05/30/2024.  During that time she reported progressive shortness of breath.  She continued to follow with pulmonary.  She was maintained on furosemide  alternating 20 and 40 mg dosing.  She noted that she had gained weight.  However, she did not attribute this to only fluid.  She also noted chest pressure and  tightness at the base of her neck.  She noted that it was not worse with exertion.  Her EKG showed sinus rhythm.  She was started on Imdur  30 mg daily.  It was felt that if the medication did not help with her symptoms that she may need a LHC.  She was also instructed to follow-up with pulmonology for further evaluation related to her increased sputum.  Follow-up in 2  months was planned.  She presents to the clinic today for follow-up evaluation and states***.   Today she denies chest pain,  melena, hematuria, hemoptysis, diaphoresis, weakness, presyncope, syncope, orthopnea, and PND.     Home Medications    Prior to Admission medications   Medication Sig Start Date End Date Taking? Authorizing Provider  acetaminophen  (TYLENOL ) 325 MG tablet Take 650 mg by mouth as needed for moderate pain or headache.    [provider]  albuterol  (VENTOLIN  HFA) 108 (90 Base) MCG/ACT inhaler Inhale 2 puffs into the lungs every 6 (six) hours as needed for wheezing or shortness of breath. 03/10/22   Parrett, Madelin RAMAN, NP  amLODipine  (NORVASC ) 5 MG tablet Take 1 tablet (5 mg total) by mouth daily. 12/01/22   Camnitz, Soyla Lunger, MD  apixaban  (ELIQUIS ) 5 MG TABS tablet Take 1 tablet (5 mg total) by mouth 2 (two) times daily. 12/10/22   Camnitz, Soyla Lunger, MD  atenolol  (TENORMIN ) 50 MG tablet Take 1 tablet (50 mg total) by mouth daily. 12/02/22   Hilty, Vinie BROCKS, MD  clonazePAM  (KLONOPIN ) 0.5 MG tablet Take 0.25 mg by mouth at bedtime as needed for anxiety.    [provider]  docusate sodium  (COLACE) 100 MG capsule Take 300 mg by mouth at bedtime.    [provider]  dofetilide  (TIKOSYN ) 250 MCG capsule Take 1 capsule (250 mcg total) by mouth 2 (two) times daily. 08/14/22   Ursuy, Renee Lynn, PA-C  furosemide  (LASIX ) 20 MG tablet Take one tablet by mouth on Monday, Wednesday and Friday 02/28/22   Fenton, Clint R, PA  irbesartan  (AVAPRO ) 300 MG tablet Take 1 tablet (300 mg total) by mouth daily. 11/12/22   Hilty, Vinie BROCKS, MD  Misc Natural Products (COLON CLEANSE) CAPS Take 4 capsules by mouth as needed (constipation).    [provider]  OVER THE COUNTER MEDICATION Take 3 capsules by mouth daily. [place], Bone Strength    [provider]  Polyethyl Glycol-Propyl Glycol (SYSTANE) 0.4-0.3 % SOLN Place 1 drop into both eyes as needed.     [provider]  polyethylene glycol (MIRALAX  / GLYCOLAX ) 17 g packet Take 17 g by mouth 2 (two) times daily.    [provider]  PRESCRIPTION MEDICATION at bedtime. CPAP    [provider]  rOPINIRole  (REQUIP ) 0.25 MG tablet Take 1 tablet in the morning and three tablets after dinner po 02/13/22   Dohmeier, Dedra, MD  Tiotropium Bromide -Olodaterol (STIOLTO RESPIMAT ) 2.5-2.5 MCG/ACT AERS USE 2 INHALATIONS ORALLY   EVERY MORNING 05/30/22   Byrum, Robert S, MD  Vitamin D-Vitamin K (VITAMIN K2-VITAMIN D3 PO) Take 1 Capful by mouth daily.    [provider]    Family History    Family History  Problem Relation Age of Onset   Alcohol  abuse Mother    Suicidality Father    Alcohol  abuse Father    Early death Father    Stroke Maternal Grandfather    Hypertension Sister    Colon cancer Neg Hx    Esophageal  cancer Neg Hx    Pancreatic cancer Neg Hx    Stomach cancer Neg Hx    Liver disease Neg Hx    She indicated that her mother is deceased. She indicated that her father is deceased. She indicated that her sister is alive. She indicated that her maternal grandfather is deceased. She indicated that the status of her neg hx is unknown.  Social History    Social History   Socioeconomic History   Marital status: Widowed    Spouse name: Not on file   Number of children: 3   Years of education: Not on file   Highest education level: Master's degree (e.g., MA, MS, MEng, MEd, MSW, MBA)  Occupational History   Occupation: retired    Comment: retired Comptroller  Tobacco Use   Smoking status: Former    Current packs/day: 0.00    Average packs/day: 1.5 packs/day for 37.0 years (55.5 ttl pk-yrs)    Types: Cigarettes    Start date: 61    Quit date: 11/04/1991    Years since quitting: 32.7   Smokeless tobacco: Never   Tobacco comments:    Former smoker 03/14/22  Vaping Use   Vaping status: Never Used  Substance and Sexual Activity   Alcohol  use: Yes     Alcohol /week: 7.0 standard drinks of alcohol     Types: 7 Glasses of wine per week    Comment: 1 glass of wine nightly 03/14/22   Drug use: No   Sexual activity: Not Currently    Birth control/protection: Surgical    Comment: Hyst, First IC >16y/o, Partners <5, No hx of STIs, DES-neg  Other Topics Concern   Not on file  Social History Narrative   Lives with daughter   Social Drivers of Health   Financial Resource Strain: Low Risk  (05/29/2024)   Overall Financial Resource Strain (CARDIA)    Difficulty of Paying Living Expenses: Not hard at all  Food Insecurity: No Food Insecurity (05/29/2024)   Hunger Vital Sign    Worried About Running Out of Food in the Last Year: Never true    Ran Out of Food in the Last Year: Never true  Transportation Needs: No Transportation Needs (05/29/2024)   PRAPARE - Administrator, Civil Service (Medical): No    Lack of Transportation (Non-Medical): No  Physical Activity: Inactive (05/29/2024)   Exercise Vital Sign    Days of Exercise per Week: 0 days    Minutes of Exercise per Session: Not on file  Stress: No Stress Concern Present (05/29/2024)   [employer] of Occupational Health - Occupational Stress Questionnaire    Feeling of Stress: Not at all  Social Connections: Moderately Integrated (05/29/2024)   Social Connection and Isolation Panel    Frequency of Communication with Friends and Family: More than three times a week    Frequency of Social Gatherings with Friends and Family: Twice a week    Attends Religious Services: More than 4 times per year    Active Member of [employer] or Organizations: Yes    Attends Banker Meetings: More than 4 times per year    Marital Status: Widowed  Intimate Partner Violence: Not At Risk (12/07/2023)   Humiliation, Afraid, Rape, and Kick questionnaire    Fear of Current or Ex-Partner: No    Emotionally Abused: No    Physically Abused: No    Sexually Abused: No     Review of Systems     General:  No  chills, fever, night sweats or weight changes.  Cardiovascular:  No chest pain, dyspnea on exertion, edema, orthopnea, palpitations, paroxysmal nocturnal dyspnea. Dermatological: No rash, lesions/masses Respiratory: No cough, dyspnea Urologic: No hematuria, dysuria Abdominal:   No nausea, vomiting, diarrhea, bright red blood per rectum, melena, or hematemesis Neurologic:  No visual changes, wkns, changes in mental status. All other systems reviewed and are otherwise negative except as noted above.  Physical Exam    VS:  There were no vitals taken for this visit. , BMI There is no height or weight on file to calculate BMI. GEN: Well nourished, well developed, in no acute distress. HEENT: normal. Neck: Supple, no JVD, carotid bruits, or masses. Cardiac: RRR, no murmurs, rubs, or gallops. No clubbing, cyanosis, generalized left ankle greater than right  edema.  Radials/DP/PT 2+ and equal bilaterally.  Respiratory:  Respirations regular and unlabored, clear to auscultation bilaterally.   GI: Soft, nontender, nondistended, BS + x 4. MS: no deformity or atrophy.   Skin: warm and dry, no rash. Neuro:  Strength and sensation are intact. Psych: Normal affect.  Accessory Clinical Findings    Recent Labs: 12/07/2023: B Natriuretic Peptide 756.6 05/30/2024: Magnesium  2.1; NT-Pro BNP 2,251 07/28/2024: ALT 19; BUN 37; Creatinine, Ser 1.30; Hemoglobin 13.7; Platelets 269; Potassium 4.2; Sodium 137   Recent Lipid Panel    Component Value Date/Time   CHOL 159 04/12/2024 0951   TRIG 64 04/12/2024 0951   HDL 75 04/12/2024 0951   CHOLHDL 2.1 04/12/2024 0951   CHOLHDL 2.7 05/26/2022 1046   LDLCALC 71 04/12/2024 0951   LDLCALC 125 (H) 05/26/2022 1046    No BP recorded.  {Refresh Note OR Click here to enter BP  :1}***    ECG personally reviewed by me today-none today.***  Nuclear stress testing 04/04/2022    The study is normal. Findings are consistent with no prior ischemia and no  prior myocardial infarction. The study is low risk.   No ST deviation was noted.   LV perfusion is normal. There is no evidence of ischemia. There is no evidence of infarction.   Left ventricular function is normal. Nuclear stress EF: 64 %. The left ventricular ejection fraction is normal (55-65%). End diastolic cavity size is normal. End systolic cavity size is normal.   Prior study available for comparison from 12/08/2018.  Echocardiogram 04/17/2022  IMPRESSIONS     1. Global longitudinal strain is -17.1% Improved from echo in Dec 2022.  Left ventricular ejection fraction, by estimation, is 65 to 70%. The left  ventricle has normal function. The left ventricle has no regional wall  motion abnormalities. Left  ventricular diastolic parameters were normal.   2. Right ventricular systolic function is mildly reduced. The right  ventricular size is normal. There is moderately elevated pulmonary artery  systolic pressure.   3. Right atrial size was severely dilated.   4. The mitral valve is normal in structure. Mild mitral valve  regurgitation.   5. Tricuspid valve regurgitation is mild to moderate.   6. The aortic valve is tricuspid. Aortic valve regurgitation is mild.  Aortic valve sclerosis is present, with no evidence of aortic valve  stenosis.   FINDINGS   Left Ventricle: Global longitudinal strain is -17.1% Improved from echo  in Dec 2022. Left ventricular ejection fraction, by estimation, is 65 to  70%. The left ventricle has normal function. The left ventricle has no  regional wall motion abnormalities.  The left ventricular internal cavity size was normal in size.  There is no  left ventricular hypertrophy. Left ventricular diastolic parameters were  normal.   Right Ventricle: The right ventricular size is normal. Right vetricular  wall thickness was not assessed. Right ventricular systolic function is  mildly reduced. There is moderately elevated pulmonary artery systolic   pressure. The tricuspid regurgitant  velocity is 3.14 m/s, and with an assumed right atrial pressure of 8 mmHg,  the estimated right ventricular systolic pressure is 47.4 mmHg.   Left Atrium: Left atrial size was normal in size.   Right Atrium: Right atrial size was severely dilated.   Pericardium: There is no evidence of pericardial effusion.   Mitral Valve: The mitral valve is normal in structure. Mild mitral valve  regurgitation.   Tricuspid Valve: The tricuspid valve is normal in structure. Tricuspid  valve regurgitation is mild to moderate.   Aortic Valve: The aortic valve is tricuspid. Aortic valve regurgitation is  mild. Aortic regurgitation PHT measures 612 msec. Aortic valve sclerosis  is present, with no evidence of aortic valve stenosis.   Pulmonic Valve: The pulmonic valve was normal in structure. Pulmonic valve  regurgitation is not visualized.   Aorta: The aortic root and ascending aorta are structurally normal, with  no evidence of dilitation.   IAS/Shunts: No atrial level shunt detected by color flow Doppler.   Echocardiogram 02/03/2023  IMPRESSIONS     1. Left ventricular ejection fraction, by estimation, is 60 to 65%. The  left ventricle has normal function. The left ventricle has no regional  wall motion abnormalities. Left ventricular diastolic parameters are  indeterminate. The average left  ventricular global longitudinal strain is -17.9 %. The global longitudinal  strain is normal.   2. Right ventricular systolic function is normal. The right ventricular  size is normal. There is mildly elevated pulmonary artery systolic  pressure. The estimated right ventricular systolic pressure is 40.9 mmHg.   3. The mitral valve is degenerative. Mild mitral valve regurgitation. No  evidence of mitral stenosis. Moderate mitral annular calcification.   4. Tricuspid valve regurgitation is moderate.   5. The aortic valve has an indeterminant number of cusps. Aortic  valve  regurgitation is mild to moderate. Aortic valve sclerosis/calcification is  present, without any evidence of aortic stenosis. Aortic regurgitation PHT  measures 341 msec. By PHT the AI   is moderate but visually appears mild.   6. The inferior vena cava is normal in size with greater than 50%  respiratory variability, suggesting right atrial pressure of 3 mmHg.   7. Right atrial size was severely dilated.   8. Left atrial size was mildly dilated.   FINDINGS   Left Ventricle: Left ventricular ejection fraction, by estimation, is 60  to 65%. The left ventricle has normal function. The left ventricle has no  regional wall motion abnormalities. The average left ventricular global  longitudinal strain is -17.9 %.  The global longitudinal strain is normal. The left ventricular internal  cavity size was normal in size. There is no left ventricular hypertrophy.  Left ventricular diastolic parameters are indeterminate. Normal left  ventricular filling pressure.   Right Ventricle: The right ventricular size is normal. No increase in  right ventricular wall thickness. Right ventricular systolic function is  normal. There is mildly elevated pulmonary artery systolic pressure. The  tricuspid regurgitant velocity is 3.08   m/s, and with an assumed right atrial pressure of 3 mmHg, the estimated  right ventricular systolic pressure is 40.9 mmHg.   Left Atrium: Left atrial size  was mildly dilated.   Right Atrium: Right atrial size was severely dilated.   Pericardium: There is no evidence of pericardial effusion.   Mitral Valve: The mitral valve is degenerative in appearance. There is  mild thickening of the mitral valve leaflet(s). Moderate mitral annular  calcification. Mild mitral valve regurgitation. No evidence of mitral  valve stenosis.   Tricuspid Valve: The tricuspid valve is normal in structure. Tricuspid  valve regurgitation is moderate . No evidence of tricuspid stenosis.    Aortic Valve: By PHT the AI is moderate but visually appears mild. The  aortic valve has an indeterminant number of cusps. Aortic valve  regurgitation is mild to moderate. Aortic regurgitation PHT measures 341  msec. Aortic valve sclerosis/calcification is  present, without any evidence of aortic stenosis.   Pulmonic Valve: The pulmonic valve was normal in structure. Pulmonic valve  regurgitation is trivial. No evidence of pulmonic stenosis.   Aorta: The aortic root is normal in size and structure.   Venous: The inferior vena cava is normal in size with greater than 50%  respiratory variability, suggesting right atrial pressure of 3 mmHg.   IAS/Shunts: No atrial level shunt detected by color flow Doppler.   Echocardiogram 11/21/2023  IMPRESSIONS     1. Left ventricular ejection fraction, by estimation, is 60 to 65%. The  left ventricle has normal function. The left ventricle has no regional  wall motion abnormalities. Left ventricular diastolic parameters are  indeterminate.   2. Right ventricular systolic function is normal. The right ventricular  size is mildly enlarged. There is severely elevated pulmonary artery  systolic pressure.   3. Left atrial size was severely dilated.   4. Right atrial size was severely dilated.   5. The mitral valve is normal in structure. Mild mitral valve  regurgitation. No evidence of mitral stenosis.   6. Tricuspid valve regurgitation is moderate.   7. The aortic valve is tricuspid. Aortic valve regurgitation is mild. No  aortic stenosis is present.   8. The inferior vena cava is dilated in size with >50% respiratory  variability, suggesting right atrial pressure of 8 mmHg.   FINDINGS   Left Ventricle: Left ventricular ejection fraction, by estimation, is 60  to 65%. The left ventricle has normal function. The left ventricle has no  regional wall motion abnormalities. The left ventricular internal cavity  size was normal in size. There is    no left ventricular hypertrophy. Left ventricular diastolic parameters  are indeterminate.   Right Ventricle: The right ventricular size is mildly enlarged. Right  ventricular systolic function is normal. There is severely elevated  pulmonary artery systolic pressure. The tricuspid regurgitant velocity is  3.70 m/s, and with an assumed right atrial   pressure of 8 mmHg, the estimated right ventricular systolic pressure is  62.8 mmHg.   Left Atrium: Left atrial size was severely dilated.   Right Atrium: Right atrial size was severely dilated.   Pericardium: There is no evidence of pericardial effusion.   Mitral Valve: The mitral valve is normal in structure. Mild mitral annular  calcification. Mild mitral valve regurgitation. No evidence of mitral  valve stenosis.   Tricuspid Valve: The tricuspid valve is normal in structure. Tricuspid  valve regurgitation is moderate . No evidence of tricuspid stenosis.   Aortic Valve: The aortic valve is tricuspid. Aortic valve regurgitation is  mild. Aortic regurgitation PHT measures 320 msec. No aortic stenosis is  present. Aortic valve mean gradient measures 4.0 mmHg. Aortic valve peak  gradient measures 7.1 mmHg. Aortic   valve area, by VTI measures 2.07 cm.   Pulmonic Valve: The pulmonic valve was normal in structure. Pulmonic valve  regurgitation is not visualized. No evidence of pulmonic stenosis.   Aorta: The aortic root is normal in size and structure.   Venous: The inferior vena cava is dilated in size with greater than 50%  respiratory variability, suggesting right atrial pressure of 8 mmHg.   IAS/Shunts: No atrial level shunt detected by color flow Doppler.   Coronary CTA 09/18/2023 FINDINGS: Non-cardiac: See separate report from [facility]. No significant findings on limited lung and soft tissue windows.   Calcium  Score: LM and 3 vessel calcium  noted   LM 136   LAD 354   LCX 434   RCA 1075   Total  1999   Coronary Arteries: Right dominant with no anomalies   LM: 25-49% ostial calcified plaque   LAD: 25-49% calcified proximal disease, 50-69% calcified mid vessel disease   D1: Normal   D2: Normal   Circumflex: 25-49% proximal / mid vessel plaque   OM1: 25-49% calcific plaque   AV groove: Normal   RCA: 50-69% calcified ostial stenosis, 25-49% calcified mid/distal stenosis   PDA: Normal   PLA: Normal   IMPRESSION: 1. LM and 3 vessel calcium  Score 1999 which is not ranked as patients age above cut off for MESA data base   2. Normal ascending thoracic aorta diameter 3.4 cm with severe calcific atherosclerosis   3.  CAD RADS 3 possibly obstructive CAD Study sent for FFR CT   Maude Emmer   Electronically Signed: By: Maude Emmer M.D. On: 09/18/2023 17:19   FFR 09/18/2023  FINDINGS: LAD: Normal 99 proximal, 0.97 mid and 0.94 distal   RCA: Normal 0.98 proximal, 0.94 mid and 0.95 distal   LCX: Normal 0.99 proximal and 0.99 mid   IMPRESSION: Negative FFR CT for obstrucitve CAD   Maude Emmer     Electronically Signed   By: Maude Emmer M.D.   On: 09/18/2023 17:23   Assessment & Plan   1. Coronary artery disease-no chest pain today.  Denies exertional chest discomfort.  Tolerating Imdur  well.  Coronary CTA 09/18/2023 showed left main 25-49%, LAD 25-49%, normal D1 and D2, circumflex 25-49%, OM1 25-49% calcified plaque, RCA 50-69% followed by 25-49% mid/distal stenosis.  Her follow-up FFR showed no obstructive coronary disease.   Maintain heart healthy diet Amlodipine , ezetimibe, metoprolol , rosuvastatin , Imdur  Maintain physical activity as tolerated  Shortness of breath/DOE-following with pulmonology.  Was seen by Dr. Shelah 07/27/2024.  She was continued on Stiolto and albuterol  as needed.  She was seen by allergy  07/28/2024.  COPD continued to be well-managed. Continue current medical therapy Maintain physical activity Continue to  monitor  Cardiomyopathy-fatigue stable.  Weight today 1***40 pounds.   Echocardiogram 11/21/2023 showed stable LVEF, intermediate diastolic parameters, severely elevated pulmonary artery systolic pressure, severely dilated left and right atria and mild mitral valve regurgitation as well as mild aortic valve regurgitation.   Coronary CTA with FFR reassuring. Continue furosemide  to 40 mg 4 days/week and continue 20 mg 3 days/week. Daily weights Lower extremity support stockings  Paroxysmal atrial fibrillation-heart rate 58***bpm.  Stable intermittent episodes of palpitations.   CHA2DS2-VASc score 6.  (CHF, HTN, vascular disease, age x 2, gender).  Denies bleeding issues.  Continue Tikosyn , bisoprolol , apixaban  to 5 mg twice daily Continue to avoid triggers-reviewed Follows with A-fib clinic, EP  Essential hypertension-BP today 12***2/58. Maintain blood pressure log.  Continue current medical therapy Heart healthy low-sodium diet Maintain physical activity   Disposition: Follow-up with Dr. Mona or me in 6 months .   Josefa HERO. Analycia Khokhar NP-C     08/01/2024, 12:27 PM [facility] [address] Office [phone] Fax [phone]    I spent 14 ***minutes examining this patient, reviewing medications, and using patient centered shared decision making involving her cardiac care.  Prior to her visit I spent greater than 20 minutes reviewing her past medical history,  medications, and prior cardiac tests.

## 2024-08-03 ENCOUNTER — Encounter: Payer: Self-pay | Admitting: General Practice

## 2024-08-03 ENCOUNTER — Ambulatory Visit: Attending: General Practice | Admitting: General Practice

## 2024-08-03 VITALS — BP 126/58 | HR 69 | Ht 62.0 in | Wt 139.2 lb

## 2024-08-03 DIAGNOSIS — I10 Essential (primary) hypertension: Secondary | ICD-10-CM | POA: Diagnosis not present

## 2024-08-03 DIAGNOSIS — I480 Paroxysmal atrial fibrillation: Secondary | ICD-10-CM | POA: Diagnosis not present

## 2024-08-03 DIAGNOSIS — I2510 Atherosclerotic heart disease of native coronary artery without angina pectoris: Secondary | ICD-10-CM | POA: Diagnosis not present

## 2024-08-03 DIAGNOSIS — I429 Cardiomyopathy, unspecified: Secondary | ICD-10-CM | POA: Diagnosis not present

## 2024-08-03 DIAGNOSIS — R0609 Other forms of dyspnea: Secondary | ICD-10-CM | POA: Insufficient documentation

## 2024-08-03 MED ORDER — IRBESARTAN 150 MG PO TABS: 150.0000 mg | ORAL_TABLET | Freq: Every day | ORAL | 3 refills | Status: AC

## 2024-08-03 NOTE — Patient Instructions (Signed)
 Medication Instructions:   Irbesartan  150 mg take one tablet daily in the morning *If you need a refill on your cardiac medications before your next appointment, please call your pharmacy*   Follow-Up: At [facility], you and your health needs are our priority.  As part of our continuing mission to provide you with exceptional heart care, our providers are all part of one team.  This team includes your primary Cardiologist (physician) and Advanced Practice Providers or APPs (Physician Assistants and Nurse Practitioners) who all work together to provide you with the care you need, when you need it.  Your next appointment:   6 month(s)  Provider:   Vinie JAYSON Maxcy, MD or Josefa Beauvais, NP

## 2024-08-04 ENCOUNTER — Telehealth: Payer: Self-pay | Admitting: *Deleted

## 2024-08-04 NOTE — Telephone Encounter (Signed)
 Called patient to inform of Ct for 08-10-24- arrival time- 4:45 pm @ [facility], no restrictions to scan, patient to receive results from Dr. Shannon on 08-11-24 @ 11:45 am, spoke with patient and she is aware of these appts. and the instructions

## 2024-08-05 LAB — ALLERGY PANEL 18, NUT MIX GROUP
Allergen Coconut IgE: <0.1 kU/L
F020-IgE Almond: <0.1 kU/L
F202-IgE Cashew Nut: <0.1 kU/L
Hazelnut (Filbert) IgE: <0.1 kU/L
Peanut IgE: <0.1 kU/L
Pecan Nut IgE: <0.1 kU/L
Sesame Seed IgE: <0.1 kU/L

## 2024-08-05 LAB — ALLERGENS W/COMP RFLX AREA 2
Alternaria Alternata IgE: <0.1 kU/L
Aspergillus Fumigatus IgE: <0.1 kU/L
Bermuda Grass IgE: <0.1 kU/L
Cedar, Mountain IgE: <0.1 kU/L
Cladosporium Herbarum IgE: <0.1 kU/L
Cockroach, German IgE: <0.1 kU/L
Common Silver Birch IgE: <0.1 kU/L
Cottonwood IgE: <0.1 kU/L
D Farinae IgE: 0.21 kU/L — AB
D Pteronyssinus IgE: <0.1 kU/L
E001-IgE Cat Dander: <0.1 kU/L
E005-IgE Dog Dander: <0.1 kU/L
Elm, American IgE: <0.1 kU/L
Johnson Grass IgE: <0.1 kU/L
Maple/Box Elder IgE: <0.1 kU/L
Mouse Urine IgE: <0.1 kU/L
Oak, White IgE: <0.1 kU/L
Pecan, Hickory IgE: <0.1 kU/L
Penicillium Chrysogen IgE: <0.1 kU/L
Pigweed, Rough IgE: <0.1 kU/L
Ragweed, Short IgE: <0.1 kU/L
Sheep Sorrel IgE Qn: <0.1 kU/L
Timothy Grass IgE: <0.1 kU/L
White Mulberry IgE: <0.1 kU/L

## 2024-08-05 LAB — CHRONIC URTICARIA PD-BAT: Pooled Donor- BAT CU: 10.07 % (ref 0.00–10.60)

## 2024-08-05 LAB — ALPHA-GAL PANEL
Allergen Lamb IgE: <0.1 kU/L
Beef IgE: <0.1 kU/L
IgE (Immunoglobulin E), Serum: 45 [IU]/mL (ref 6–495)
O215-IgE Alpha-Gal: <0.1 kU/L
Pork IgE: <0.1 kU/L

## 2024-08-05 LAB — CBC WITH DIFF/PLATELET
Basophils Absolute: 0 x10E3/uL (ref 0.0–0.2)
Basos: 1 %
EOS (ABSOLUTE): 0.1 x10E3/uL (ref 0.0–0.4)
Eos: 2 %
Hematocrit: 41.5 % (ref 34.0–46.6)
Hemoglobin: 13.7 g/dL (ref 11.1–15.9)
Immature Grans (Abs): 0 x10E3/uL (ref 0.0–0.1)
Immature Granulocytes: 0 %
Lymphocytes Absolute: 1.4 x10E3/uL (ref 0.7–3.1)
Lymphs: 22 %
MCH: 33.2 pg — ABNORMAL HIGH (ref 26.6–33.0)
MCHC: 33 g/dL (ref 31.5–35.7)
MCV: 101 fL — ABNORMAL HIGH (ref 79–97)
Monocytes Absolute: 1 x10E3/uL — ABNORMAL HIGH (ref 0.1–0.9)
Monocytes: 16 %
Neutrophils Absolute: 3.8 x10E3/uL (ref 1.4–7.0)
Neutrophils: 59 %
Platelets: 269 x10E3/uL (ref 150–450)
RBC: 4.13 x10E6/uL (ref 3.77–5.28)
RDW: 13.4 % (ref 11.7–15.4)
WBC: 6.3 x10E3/uL (ref 3.4–10.8)

## 2024-08-05 LAB — ALLERGEN MILK: Milk IgE: <0.1 kU/L

## 2024-08-05 LAB — CMP14+EGFR
ALT: 19 IU/L (ref 0–32)
AST: 22 IU/L (ref 0–40)
Albumin: 4.5 g/dL (ref 3.7–4.7)
Alkaline Phosphatase: 106 IU/L (ref 48–129)
BUN/Creatinine Ratio: 28 (ref 12–28)
BUN: 37 mg/dL — ABNORMAL HIGH (ref 8–27)
Bilirubin Total: 0.8 mg/dL (ref 0.0–1.2)
CO2: 23 mmol/L (ref 20–29)
Calcium: 9.3 mg/dL (ref 8.7–10.3)
Chloride: 98 mmol/L (ref 96–106)
Creatinine, Ser: 1.3 mg/dL — ABNORMAL HIGH (ref 0.57–1.00)
Globulin, Total: 2.5 g/dL (ref 1.5–4.5)
Glucose: 92 mg/dL (ref 70–99)
Potassium: 4.2 mmol/L (ref 3.5–5.2)
Sodium: 137 mmol/L (ref 134–144)
Total Protein: 7 g/dL (ref 6.0–8.5)
eGFR: 39 mL/min/1.73 — ABNORMAL LOW (ref >59)

## 2024-08-05 LAB — FANA STAINING PATTERNS: Speckled Pattern: 1:80 {titer}

## 2024-08-05 LAB — ALLERGY PANEL 19, SEAFOOD GROUP
Allergen Salmon IgE: <0.1 kU/L
Catfish: <0.1 kU/L
Codfish IgE: <0.1 kU/L
F023-IgE Crab: <0.1 kU/L
F080-IgE Lobster: <0.1 kU/L
Shrimp IgE: <0.1 kU/L
Tuna: <0.1 kU/L

## 2024-08-05 LAB — C-REACTIVE PROTEIN: CRP: 3 mg/L (ref 0–10)

## 2024-08-05 LAB — F245-IGE EGG, WHOLE: Egg, Whole IgE: <0.1 kU/L

## 2024-08-05 LAB — ALLERGEN, WHEAT, F4: Wheat IgE: <0.1 kU/L

## 2024-08-05 LAB — TRYPTASE: Tryptase: 7.5 ug/L (ref 2.2–13.2)

## 2024-08-05 LAB — ANTINUCLEAR ANTIBODIES, IFA: ANA Titer 1: POSITIVE — AB

## 2024-08-05 LAB — ALLERGEN SOYBEAN: Soybean IgE: <0.1 kU/L

## 2024-08-05 LAB — THYROID ANTIBODIES (THYROPEROXIDASE & THYROGLOBULIN)
Thyroglobulin Antibody: <1 [IU]/mL (ref 0.0–0.9)
Thyroperoxidase Ab SerPl-aCnc: <9 [IU]/mL (ref 0–34)

## 2024-08-05 LAB — SEDIMENTATION RATE: Sed Rate: 49 mm/h — ABNORMAL HIGH (ref 0–40)

## 2024-08-07 ENCOUNTER — Ambulatory Visit: Payer: Self-pay | Admitting: Allergy & Immunology

## 2024-08-10 ENCOUNTER — Ambulatory Visit (HOSPITAL_COMMUNITY)
Admission: RE | Admit: 2024-08-10 | Discharge: 2024-08-10 | Disposition: A | Discharge status: Home / Self Care | Source: Ambulatory Visit | Attending: Radiation Oncology | Admitting: Radiation Oncology

## 2024-08-10 ENCOUNTER — Encounter (HOSPITAL_COMMUNITY): Payer: Self-pay

## 2024-08-10 DIAGNOSIS — R911 Solitary pulmonary nodule: Secondary | ICD-10-CM | POA: Diagnosis not present

## 2024-08-10 DIAGNOSIS — R918 Other nonspecific abnormal finding of lung field: Secondary | ICD-10-CM | POA: Diagnosis not present

## 2024-08-10 DIAGNOSIS — C3490 Malignant neoplasm of unspecified part of unspecified bronchus or lung: Secondary | ICD-10-CM | POA: Diagnosis not present

## 2024-08-10 NOTE — Progress Notes (Signed)
 Radiation Oncology         [phone] ________________________________  Name: Holly Benson MRN: [id number]  Date: 08/11/2024  DOB: [birth year]  Follow-Up Visit Note  CC: Holly Benson, Holly GRADE, MD  Holly Lamar RAMAN, MD  No diagnosis found.  Diagnosis: The encounter diagnosis was Lung nodule [R91.1].   Slowly enlarging RUL pulmonary nodule, with features suggestive of non-small cell lung cancer (non-biopsy proven)      Interval Since Last Radiation: 10 months, 14 days    Indication for treatment: Curative       Radiation treatment dates:  First Treatment Date: 2023-09-22 - Last Treatment Date: 2023-09-29   Site/Dose/Technique/Mode:    Site: Lung, Right Technique: SBRT/SRT-IMRT Mode: Photon Dose Per Fraction: 18 Gy Prescribed Dose (Delivered / Prescribed): 54 Gy / 54 Gy Prescribed Fxs (Delivered / Prescribed): 3 / 3   Narrative:  The patient returns today for routine follow-up and to review most recent imaging. She was last seen in office on 02/02/24 for a routine follow up. Since then, she continued to follow up with her specialists to manage her chronic conditions.       In the interval since she was last seen, she presented for a follow up visit with Dr. Shelah on 02/10/24 during which she complained of shortness of breath and was advised to continue using Stiolto 2 puffs once daily. Alternative BD therapy was also discussed if her dyspnea continues. She returned for a follow up on 07/27/24 where he recommended having the COVID-19 vaccine and the RSV vaccine                        [name] test which showed hypoxemia and was recommended to starts BiPAP   Most recent CT chest performed on 08/10/24 showed ***  No other significant oncologic interval history since the patient was last seen.     Allergies:  is allergic to lovenox [enoxaparin sodium] and metoprolol  succinate [metoprolol ].  Meds: Current Outpatient Medications  Medication Sig Dispense  Refill   albuterol  (VENTOLIN  HFA) 108 (90 Base) MCG/ACT inhaler INHALE 2 PUFFS BY MOUTH EVERY 6 HOURS AS NEEDED FOR WHEEZING OR FOR SHORTNESS OF BREATH 18 g 6   apixaban  (ELIQUIS ) 5 MG TABS tablet Take 1 tablet (5 mg total) by mouth 2 (two) times daily. 180 tablet 1   bisoprolol  (ZEBETA ) 10 MG tablet TAKE 1 TABLET BY MOUTH AT BEDTIME 90 tablet 3   Cholecalciferol (VITAMIN D3 MAXIMUM STRENGTH) 125 MCG (5000 UT) capsule Take 5,000 Units by mouth daily.     clonazePAM  (KLONOPIN ) 0.5 MG tablet Take 0.5 tablets (0.25 mg total) by mouth at bedtime as needed (for restless legs). 30 tablet 5   dofetilide  (TIKOSYN ) 250 MCG capsule TAKE 1 CAPSULE BY MOUTH 2 TIMES A DAY. 180 capsule 2   famotidine  (PEPCID ) 20 MG tablet TAKE 1 TABLET BY MOUTH 2 TIMES A DAY FOR 14 DAYS 28 tablet 0   furosemide  (LASIX ) 20 MG tablet Take 1 tablet (20 mg total) by mouth 3 (three) times a week AND 2 tablets (40 mg total) 4 (four) times a week. Take 20 mg by mouth in the morning on Mon/Wed/Fri and 40 mg on Sun/Tues/Thurs/Sat.     irbesartan  (AVAPRO ) 150 MG tablet Take 1 tablet (150 mg total) by mouth daily with breakfast. 90 tablet 3   MAGNESIUM  OXIDE PO Take 1 tablet by mouth daily.     Misc Natural Products (COLON CLEANSE)  CAPS Take 4 capsules by mouth daily as needed (constipation).     Multiple Vitamins-Minerals (PRESERVISION AREDS 2) CAPS Take 1 capsule by mouth 2 (two) times daily.     rOPINIRole  (REQUIP ) 0.5 MG tablet TAKE 1 TABLET BY MOUTH EVERY MORNING AND TAKE 1 TABLET BY MOUTH AFTER 3PM AND TAKE 1 TABLET AFTER DINNER 8PM 90 tablet 5   rosuvastatin  (CRESTOR ) 20 MG tablet Take 1 tablet (20 mg total) by mouth daily. 90 tablet 3   spironolactone  (ALDACTONE ) 25 MG tablet Take 0.5 tablets (12.5 mg total) by mouth daily. 45 tablet 3   STIOLTO RESPIMAT  2.5-2.5 MCG/ACT AERS USE 2 INHALATIONS ORALLY   EVERY MORNING 12 g 3   No current facility-administered medications for this visit.   Facility-Administered Medications Ordered in  Other Visits  Medication Dose Route Frequency Provider Last Rate Last Admin   technetium tetrofosmin  (TC-MYOVIEW ) injection 31.9 millicurie  31.9 millicurie Intravenous Once PRN Raford Riggs, MD        Physical Findings: [name]. [name].  vitals were not taken for this visit. .  No significant changes. Lungs are clear to auscultation bilaterally. Heart has regular rate and rhythm. No palpable cervical, supraclavicular, or axillary adenopathy. Abdomen soft, non-tender, normal bowel sounds.   Lab Findings: Lab Results  Component Value Date   WBC 6.3 07/28/2024   HGB 13.7 07/28/2024   HCT 41.5 07/28/2024   MCV 101 (H) 07/28/2024   PLT 269 07/28/2024    Radiographic Findings: No results found.  Impression: Slowly enlarging RUL pulmonary nodule, with features suggestive of non-small cell lung cancer (non-biopsy proven)     The patient is recovering from the effects of radiation.  ***  Plan:  ***   *** minutes of total time was spent for this patient encounter, including preparation, face-to-face counseling with the patient and coordination of care, physical exam, and documentation of the encounter. ____________________________________  Lynwood CHARM Nasuti, PhD, MD  This document serves as a record of services personally performed by Lynwood Nasuti, MD. It was created on his behalf by Reymundo Cartwright, a trained medical scribe. The creation of this record is based on the scribe's personal observations and the provider's statements to them. This document has been checked and approved by the attending provider.

## 2024-08-11 ENCOUNTER — Encounter: Payer: Self-pay | Admitting: Radiation Oncology

## 2024-08-11 ENCOUNTER — Ambulatory Visit
Admission: RE | Admit: 2024-08-11 | Discharge: 2024-08-11 | Disposition: A | Discharge status: Home / Self Care | Payer: Self-pay | Source: Ambulatory Visit | Attending: Radiation Oncology | Admitting: Radiation Oncology

## 2024-08-11 DIAGNOSIS — R911 Solitary pulmonary nodule: Secondary | ICD-10-CM | POA: Diagnosis not present

## 2024-08-11 DIAGNOSIS — C3411 Malignant neoplasm of upper lobe, right bronchus or lung: Secondary | ICD-10-CM | POA: Diagnosis not present

## 2024-08-11 NOTE — Progress Notes (Signed)
 Holly Benson is here today for follow up post radiation to the lung.  Lung Side: Right, patient completed treatment on 09/29/23.   Does the patient complain of any of the following: Pain: No Shortness of breath w/wo exertion: Yes, mostly on exertion.  Cough: yes, productive Hemoptysis: No Pain with swallowing: No, reports having some acid reflux.  Swallowing/choking concerns: No Appetite: Good Energy Level: Fair  Post radiation skin Changes: No     Additional comments if applicable:   BP (!) (P) 109/34 (BP Location: Left Arm, Patient Position: Sitting)   Pulse (P) 71   Temp (!) (P) 96.9 F (36.1 C) (Temporal)   Resp (P) 18   Ht (P) 5' 2 (1.575 m)   Wt (P) 141 lb (64 kg)   BMI (P) 25.79 kg/m

## 2024-08-12 ENCOUNTER — Telehealth: Payer: Self-pay | Admitting: *Deleted

## 2024-08-12 DIAGNOSIS — H353122 Nonexudative age-related macular degeneration, left eye, intermediate dry stage: Secondary | ICD-10-CM | POA: Diagnosis not present

## 2024-08-12 DIAGNOSIS — H353114 Nonexudative age-related macular degeneration, right eye, advanced atrophic with subfoveal involvement: Secondary | ICD-10-CM | POA: Diagnosis not present

## 2024-08-12 NOTE — Telephone Encounter (Signed)
 CALLED PATIENT'S DAUGHTER- BETSY GREER TO INFORM OF PET SCAN FOR HER MOM ON 08-19-24- ARRIVAL TIME- 4 PM , PATIENT TO HAVE WATER ONLY- 6 HRS. PRIOR TO SCAN, PATIENT TO RECEIVE RESULTS FROM DR. KINARD ON 08-25-24 @ 4 PM, SPOKE WITH PATIENT'S DAUGHTER- BETSY GREER AND SHE IS AWARE OF THESE APPTS. AND THE INSTRUCTIONS

## 2024-08-15 NOTE — Progress Notes (Unsigned)
 PATIENT: Holly Benson DOB: [birth year]  REASON FOR VISIT: follow up HISTORY FROM: patient  No chief complaint on file.    HISTORY OF PRESENT ILLNESS:  08/15/24 ALL: Megham Dwyer is a 89 y.o. female here today for follow up for OSA on BiPAP.  He was last seen by Dr Chalice 02/2024. She had previously been on ASV through cardiology. She felt better rested when not using ASV. HST performed 03/2024 showed severe degree of sleep apnea with AHI 33.8/hr. She was started on BiPAP therapy. Since,     HISTORY: (copied from Dr Dohmeier's previous notes)  Holly Benson is a 89 y.o. female patient who is here for revisit 02/18/2024 for RLS:  gets refills    has atrial fib,  occasional flutter sensation on Tikosyn .  This condition begot the sleep apnea, according Dr Burnard.    PAD,  starting some cramping at groin,    Aching hands and finger joints-  finger tips are numb and tingly.    Dr Shannon - Dr Mona followed for spots on her lung, Dr Mona has seen her for a long time, now started  radiation.  .  Chief concern according to patient :  I wonder if I still need the AVS, as I feel I sleep better without  ASV. My sleep monitor is  indicating long sleep without ASV but I don't know if I need  to treat AHI.    Dr Burnard has done her sleep tests in 2020, I have never tested the patient.    We discussed a repeat sleep study without ASV, and the patient requested a HST.    2022;  She states she continues to have ongoing concerns with RLS symptoms. She also says for > 6 mths she has been having numbness.tingling in bilateral thumb,pointer and middle fingers.  PS : Pt also wanted to know if Dr Chalice would be willing to take on her ASV- she is not getting proper f/u care with her machine ( prescribed by dr Burnard, Cardiologist) and would really like to address this here. She is set up with a machine through [facility] and they don't help with mask refits. Her last SS was in 2020  Dr Burnard. The studies were never read- EPIC NOTE still states  Dr Burnard to read _ ASV results unknown. Referred by Dr Mona who follows for atrial fib. Dr Inocencio started amiodarone , but after 2-3 years she had to stop it. Amiodarone  induced myopathy/ neuropathy.  She feels the mask is not fitting he well at all. She wants to change providers for ASV-   08-15-2021: Holly Benson has felt that it was 4 times daily ropinirole  and up to 4 times daily gabapentin  she was able to get by with little clonazepam  intake -maybe 50 tablets a year.   However nothing has come trolled her restless leg syndrome as well as clonazepam .  There is also the feeling that she still has medication induced sleepiness. She feels RLS were worse when she is seated and awake rather than in bed.  She does not suddenly fall asleep but she does feel fatigued and sleepy her on medication.  Her last 3 visit in his office were actually done with nurse practitioner Isabele Lollar on 12/18/2020 on 3 6 and on 04-23-21.   I will follow this patient after September 2023 for central apnea if she likes me to.     REVIEW OF SYSTEMS: Out of a complete 14 system  review of symptoms, the patient complains only of the following symptoms, and all other reviewed systems are negative.  ESS:  ALLERGIES: Allergies  Allergen Reactions   Lovenox [Enoxaparin Sodium] Hives, Rash and Other (See Comments)    PT STATES SHE BROKE OUT IN A RASH HEAD TO TOE AND LASTED ABOUT 3 WEEKS    Metoprolol  Succinate [Metoprolol ] Other (See Comments)    Muscle aches, hand pain, and tingling    HOME MEDICATIONS: Outpatient Medications Prior to Visit  Medication Sig Dispense Refill   albuterol  (VENTOLIN  HFA) 108 (90 Base) MCG/ACT inhaler INHALE 2 PUFFS BY MOUTH EVERY 6 HOURS AS NEEDED FOR WHEEZING OR FOR SHORTNESS OF BREATH 18 g 6   apixaban  (ELIQUIS ) 5 MG TABS tablet Take 1 tablet (5 mg total) by mouth 2 (two) times daily. 180 tablet 1   bisoprolol  (ZEBETA ) 10 MG tablet  TAKE 1 TABLET BY MOUTH AT BEDTIME 90 tablet 3   Cholecalciferol (VITAMIN D3 MAXIMUM STRENGTH) 125 MCG (5000 UT) capsule Take 5,000 Units by mouth daily.     clonazePAM  (KLONOPIN ) 0.5 MG tablet Take 0.5 tablets (0.25 mg total) by mouth at bedtime as needed (for restless legs). 30 tablet 5   dofetilide  (TIKOSYN ) 250 MCG capsule TAKE 1 CAPSULE BY MOUTH 2 TIMES A DAY. 180 capsule 2   famotidine  (PEPCID ) 20 MG tablet TAKE 1 TABLET BY MOUTH 2 TIMES A DAY FOR 14 DAYS 28 tablet 0   furosemide  (LASIX ) 20 MG tablet Take 1 tablet (20 mg total) by mouth 3 (three) times a week AND 2 tablets (40 mg total) 4 (four) times a week. Take 20 mg by mouth in the morning on Mon/Wed/Fri and 40 mg on Sun/Tues/Thurs/Sat.     irbesartan  (AVAPRO ) 150 MG tablet Take 1 tablet (150 mg total) by mouth daily with breakfast. 90 tablet 3   MAGNESIUM  OXIDE PO Take 1 tablet by mouth daily.     Misc Natural Products (COLON CLEANSE) CAPS Take 4 capsules by mouth daily as needed (constipation).     Multiple Vitamins-Minerals (PRESERVISION AREDS 2) CAPS Take 1 capsule by mouth 2 (two) times daily.     rOPINIRole  (REQUIP ) 0.5 MG tablet TAKE 1 TABLET BY MOUTH EVERY MORNING AND TAKE 1 TABLET BY MOUTH AFTER 3PM AND TAKE 1 TABLET AFTER DINNER 8PM 90 tablet 5   rosuvastatin  (CRESTOR ) 20 MG tablet Take 1 tablet (20 mg total) by mouth daily. 90 tablet 3   spironolactone  (ALDACTONE ) 25 MG tablet Take 0.5 tablets (12.5 mg total) by mouth daily. 45 tablet 3   STIOLTO RESPIMAT  2.5-2.5 MCG/ACT AERS USE 2 INHALATIONS ORALLY   EVERY MORNING 12 g 3   Facility-Administered Medications Prior to Visit  Medication Dose Route Frequency Provider Last Rate Last Admin   technetium tetrofosmin  (TC-MYOVIEW ) injection 31.9 millicurie  31.9 millicurie Intravenous Once PRN Raford Riggs, MD        PAST MEDICAL HISTORY: Past Medical History:  Diagnosis Date   Allergy  Childhod   Seasonal   Arrhythmia    Arthritis    Atrial fibrillation (HCC)    Cancer  (HCC)    Constipation    chronic   COPD (chronic obstructive pulmonary disease) (HCC)    Emphysema of lung (HCC)    GERD (gastroesophageal reflux disease) ?   Occasional   History of radiation therapy    Right lung- 09/22/23-09/29/23- Dr. Lynwood Nasuti   Hyperlipidemia    Hypertension    pulmonary   OSA on CPAP    Osteoarthritis  Osteoporosis    Peripheral neuropathy    Pulmonary hypertension (HCC)    RLS (restless legs syndrome)    Sleep apnea     PAST SURGICAL HISTORY: Past Surgical History:  Procedure Laterality Date   ABDOMINAL AORTOGRAM W/LOWER EXTREMITY N/A 03/21/2020   Procedure: ABDOMINAL AORTOGRAM W/ Bilateral LOWER EXTREMITY [place];  Surgeon: Darron Deatrice LABOR, MD;  Location: [facility];  Service: Cardiovascular;  Laterality: N/A;   ABDOMINAL HYSTERECTOMY Bilateral    arm surgery Right    Broken arm and has a plate in it   CARDIOVERSION N/A 03/12/2023   Procedure: CARDIOVERSION;  Surgeon: Shlomo Wilbert SAUNDERS, MD;  Location: [facility];  Service: Cardiovascular;  Laterality: N/A;   CATARACT EXTRACTION Bilateral    COLONOSCOPY     More than 10 years ago In [facility]   EYE SURGERY  1996(?)   Cataract removal   FRACTURE SURGERY  2014   Proximal humerus - plate inserted   HERNIA REPAIR  08/2022   KNEE ARTHROSCOPY Right    PERIPHERAL VASCULAR INTERVENTION Bilateral 03/21/2020   Procedure: PERIPHERAL VASCULAR INTERVENTION;  Surgeon: Darron Deatrice LABOR, MD;  Location: [facility];  Service: Cardiovascular;  Laterality: Bilateral;  external iliac   TONSILLECTOMY     ventral herniorrhaphy  2023   mesh used    FAMILY HISTORY: Family History  Problem Relation Age of Onset   Alcohol  abuse Mother    Suicidality Father    Alcohol  abuse Father    Early death Father    Stroke Maternal Grandfather    Hypertension Sister    Colon cancer Neg Hx    Esophageal cancer Neg Hx    Pancreatic cancer Neg Hx    Stomach cancer Neg Hx    Liver disease Neg  Hx     SOCIAL HISTORY: Social History   Socioeconomic History   Marital status: Widowed    Spouse name: Not on file   Number of children: 3   Years of education: Not on file   Highest education level: Master's degree (e.g., MA, MS, MEng, MEd, MSW, MBA)  Occupational History   Occupation: retired    Comment: retired Comptroller  Tobacco Use   Smoking status: Former    Current packs/day: 0.00    Average packs/day: 1.5 packs/day for 37.0 years (55.5 ttl pk-yrs)    Types: Cigarettes    Start date: 45    Quit date: 11/04/1991    Years since quitting: 32.8   Smokeless tobacco: Never   Tobacco comments:    Former smoker 03/14/22  Vaping Use   Vaping status: Never Used  Substance and Sexual Activity   Alcohol  use: Yes    Alcohol /week: 7.0 standard drinks of alcohol     Types: 7 Glasses of wine per week    Comment: 1 glass of wine nightly 03/14/22   Drug use: No   Sexual activity: Not Currently    Birth control/protection: Surgical    Comment: Hyst, First IC >16y/o, Partners <5, No hx of STIs, DES-neg  Other Topics Concern   Not on file  Social History Narrative   Lives with daughter   Social Drivers of Health   Financial Resource Strain: Low Risk  (05/29/2024)   Overall Financial Resource Strain (CARDIA)    Difficulty of Paying Living Expenses: Not hard at all  Food Insecurity: No Food Insecurity (05/29/2024)   Hunger Vital Sign    Worried About Running Out of Food in the Last Year: Never  true    Ran Out of Food in the Last Year: Never true  Transportation Needs: No Transportation Needs (05/29/2024)   PRAPARE - Administrator, Civil Service (Medical): No    Lack of Transportation (Non-Medical): No  Physical Activity: Inactive (05/29/2024)   Exercise Vital Sign    Days of Exercise per Week: 0 days    Minutes of Exercise per Session: Not on file  Stress: No Stress Concern Present (05/29/2024)   [employer] of Occupational Health - Occupational Stress  Questionnaire    Feeling of Stress: Not at all  Social Connections: Moderately Integrated (05/29/2024)   Social Connection and Isolation Panel    Frequency of Communication with Friends and Family: More than three times a week    Frequency of Social Gatherings with Friends and Family: Twice a week    Attends Religious Services: More than 4 times per year    Active Member of [employer] or Organizations: Yes    Attends Banker Meetings: More than 4 times per year    Marital Status: Widowed  Intimate Partner Violence: Not At Risk (12/07/2023)   Humiliation, Afraid, Rape, and Kick questionnaire    Fear of Current or Ex-Partner: No    Emotionally Abused: No    Physically Abused: No    Sexually Abused: No     PHYSICAL EXAM  There were no vitals filed for this visit. There is no height or weight on file to calculate BMI.  Generalized: Well developed, in no acute distress  Cardiology: normal rate and rhythm, no murmur noted Respiratory: clear to auscultation bilaterally  Neurological examination  Mentation: Alert oriented to time, place, history taking. Follows all commands speech and language fluent Cranial nerve II-XII: Pupils were equal round reactive to light. Extraocular movements were full, visual field were full  Motor: The motor testing reveals 5 over 5 strength of all 4 extremities. Good symmetric motor tone is noted throughout.  Gait and station: Gait is normal.    DIAGNOSTIC DATA (LABS, IMAGING, TESTING) - I reviewed patient records, labs, notes, testing and imaging myself where available.      No data to display           Lab Results  Component Value Date   WBC 6.3 07/28/2024   HGB 13.7 07/28/2024   HCT 41.5 07/28/2024   MCV 101 (H) 07/28/2024   PLT 269 07/28/2024      Component Value Date/Time   NA 137 07/28/2024 1016   K 4.2 07/28/2024 1016   CL 98 07/28/2024 1016   CO2 23 07/28/2024 1016   GLUCOSE 92 07/28/2024 1016   GLUCOSE 148 (H) 12/08/2023  0438   BUN 37 (H) 07/28/2024 1016   CREATININE 1.30 (H) 07/28/2024 1016   CREATININE 0.81 05/26/2022 1046   CALCIUM  9.3 07/28/2024 1016   PROT 7.0 07/28/2024 1016   ALBUMIN 4.5 07/28/2024 1016   AST 22 07/28/2024 1016   ALT 19 07/28/2024 1016   ALKPHOS 106 07/28/2024 1016   BILITOT 0.8 07/28/2024 1016   GFRNONAA >60 12/08/2023 0438   GFRAA 59 (L) 08/01/2020 1549   Lab Results  Component Value Date   CHOL 159 04/12/2024   HDL 75 04/12/2024   LDLCALC 71 04/12/2024   TRIG 64 04/12/2024   CHOLHDL 2.1 04/12/2024   No results found for: HGBA1C No results found for: VITAMINB12 Lab Results  Component Value Date   TSH 2.09 05/26/2022     ASSESSMENT AND PLAN 89 y.o.  year old female  has a past medical history of Allergy  (Childhod), Arrhythmia, Arthritis, Atrial fibrillation (HCC), Cancer (HCC), Constipation, COPD (chronic obstructive pulmonary disease) (HCC), Emphysema of lung (HCC), GERD (gastroesophageal reflux disease) (?), History of radiation therapy, Hyperlipidemia, Hypertension, OSA on CPAP, Osteoarthritis, Osteoporosis, Peripheral neuropathy, Pulmonary hypertension (HCC), RLS (restless legs syndrome), and Sleep apnea. here with   No diagnosis found.   Holly Benson is doing well on BiPAP therapy. Compliance report reveals ***. *** was encouraged to continue using BiPAP nightly and for greater than 4 hours each night. We will update supply orders as indicated. Risks of untreated sleep apnea review and education materials provided. Healthy lifestyle habits encouraged. *** will follow up in ***, sooner if needed. *** verbalizes understanding and agreement with this plan.    No orders of the defined types were placed in this encounter.    No orders of the defined types were placed in this encounter.       Greig Forbes, FNP-C 08/15/2024, 4:29 PM [facility] [address] [place], [place] [address] [phone]

## 2024-08-15 NOTE — Patient Instructions (Incomplete)
 Please continue using your BiPAP regularly. While your insurance requires that you use BiPAP at least 4 hours each night on 70% of the nights, I recommend, that you not skip any nights and use it throughout the night if you can. Getting used to BiPAP and staying with the treatment long term does take time and patience and discipline. Untreated obstructive sleep apnea when it is moderate to severe can have an adverse impact on cardiovascular health and raise her risk for heart disease, arrhythmias, hypertension, congestive heart failure, stroke and diabetes. Untreated obstructive sleep apnea causes sleep disruption, nonrestorative sleep, and sleep deprivation. This can have an impact on your day to day functioning and cause daytime sleepiness and impairment of cognitive function, memory loss, mood disturbance, and problems focussing. Using BiPAP regularly can improve these symptoms.  We will update supply orders, today.  Follow up in 1 year

## 2024-08-16 ENCOUNTER — Ambulatory Visit: Admitting: Family Medicine

## 2024-08-16 ENCOUNTER — Encounter: Payer: Self-pay | Admitting: Family Medicine

## 2024-08-16 VITALS — BP 115/60 | HR 72 | Ht 62.0 in | Wt 141.6 lb

## 2024-08-16 DIAGNOSIS — I480 Paroxysmal atrial fibrillation: Secondary | ICD-10-CM

## 2024-08-16 DIAGNOSIS — G2581 Restless legs syndrome: Secondary | ICD-10-CM

## 2024-08-16 DIAGNOSIS — M545 Low back pain, unspecified: Secondary | ICD-10-CM

## 2024-08-16 DIAGNOSIS — G8929 Other chronic pain: Secondary | ICD-10-CM | POA: Diagnosis not present

## 2024-08-16 DIAGNOSIS — G4733 Obstructive sleep apnea (adult) (pediatric): Secondary | ICD-10-CM | POA: Diagnosis not present

## 2024-08-16 NOTE — Progress Notes (Signed)
 CPAP supplies order sent to [facility] via community message/EPIC

## 2024-08-19 ENCOUNTER — Encounter (HOSPITAL_COMMUNITY)
Admission: RE | Admit: 2024-08-19 | Discharge: 2024-08-19 | Disposition: A | Discharge status: Home / Self Care | Source: Ambulatory Visit | Attending: Radiology | Admitting: Radiology

## 2024-08-19 DIAGNOSIS — R911 Solitary pulmonary nodule: Secondary | ICD-10-CM | POA: Diagnosis not present

## 2024-08-19 DIAGNOSIS — C3412 Malignant neoplasm of upper lobe, left bronchus or lung: Secondary | ICD-10-CM | POA: Diagnosis not present

## 2024-08-19 LAB — GLUCOSE, CAPILLARY: Glucose-Capillary: 122 mg/dL — ABNORMAL HIGH (ref 70–99)

## 2024-08-19 MED ADMIN — FLUDEOXYGLUCOSE F - 18 (FDG) INJECTION: 7.03 | INTRAVENOUS | NDC 99999080007

## 2024-08-24 NOTE — Progress Notes (Signed)
 Radiation Oncology         [phone] ________________________________  Name: Holly Benson MRN: [id number]  Date: 08/25/2024  DOB: 22-Mar-1933  Follow-Up Visit Note  CC: [name], [name], MD  [name], MD  No diagnosis found.  Diagnosis: The encounter diagnosis was Lung nodule [R91.1].   Slowly enlarging RUL pulmonary nodule, with features suggestive of non-small cell lung cancer (non-biopsy proven)      Interval Since Last Radiation: 11 months, 1 days    Indication for treatment: Curative       Radiation treatment dates:  First Treatment Date: 2023-09-22 - Last Treatment Date: 2023-09-29 Site/Dose/Technique/Mode:  Site: Lung, Right Technique: SBRT/SRT-IMRT Mode: Photon Dose Per Fraction: 18 Gy Prescribed Dose (Delivered / Prescribed): 54 Gy / 54 Gy Prescribed Fxs (Delivered / Prescribed): 3 / 3   Narrative:  The patient returns today for routine follow-up and to review most recent imaging. She was last seen in office on 08/11/24 for a routine follow up. Since then, she continued to follow up with her specialists to manage her chronic conditions.                                She underwent a CT chest on 10/8 showed mild interval increase of the opacity in the right apex; increasing fullness in the right upper hilum/right lower paratracheal region; interval decrease in size of the previously seen multiple nodules in the LUL.   PET scan performed on 08/19/24 showed a  hypermetabolic right paratracheal nodal mass, consistent with metastatic disease. Scan also noted anterior right upper lobe consolidation with low-level, relatively diffuse activity, favoring evolving radiation change with reevaluation on chest CT follow-up at 3 months is recommended.  No other significant oncologic interval history since the patient was last seen.    Allergies:  is allergic to lovenox [enoxaparin sodium] and metoprolol  succinate [metoprolol ].  Meds: Current Outpatient  Medications  Medication Sig Dispense Refill   albuterol  (VENTOLIN  HFA) 108 (90 Base) MCG/ACT inhaler INHALE 2 PUFFS BY MOUTH EVERY 6 HOURS AS NEEDED FOR WHEEZING OR FOR SHORTNESS OF BREATH 18 g 6   apixaban  (ELIQUIS ) 5 MG TABS tablet Take 1 tablet (5 mg total) by mouth 2 (two) times daily. 180 tablet 1   bisoprolol  (ZEBETA ) 10 MG tablet TAKE 1 TABLET BY MOUTH AT BEDTIME 90 tablet 3   Cholecalciferol (VITAMIN D3 MAXIMUM STRENGTH) 125 MCG (5000 UT) capsule Take 5,000 Units by mouth daily.     clonazePAM  (KLONOPIN ) 0.5 MG tablet Take 0.5 tablets (0.25 mg total) by mouth at bedtime as needed (for restless legs). 30 tablet 5   dofetilide  (TIKOSYN ) 250 MCG capsule TAKE 1 CAPSULE BY MOUTH 2 TIMES A DAY. 180 capsule 2   famotidine  (PEPCID ) 20 MG tablet TAKE 1 TABLET BY MOUTH 2 TIMES A DAY FOR 14 DAYS 28 tablet 0   furosemide  (LASIX ) 20 MG tablet Take 1 tablet (20 mg total) by mouth 3 (three) times a week AND 2 tablets (40 mg total) 4 (four) times a week. Take 20 mg by mouth in the morning on Mon/Wed/Fri and 40 mg on Sun/Tues/Thurs/Sat.     irbesartan  (AVAPRO ) 150 MG tablet Take 1 tablet (150 mg total) by mouth daily with breakfast. 90 tablet 3   MAGNESIUM  OXIDE PO Take 1 tablet by mouth daily.     Multiple Vitamins-Minerals (PRESERVISION AREDS 2) CAPS Take 1 capsule by mouth 2 (two) times  daily.     rOPINIRole  (REQUIP ) 0.5 MG tablet TAKE 1 TABLET BY MOUTH EVERY MORNING AND TAKE 1 TABLET BY MOUTH AFTER 3PM AND TAKE 1 TABLET AFTER DINNER 8PM 90 tablet 5   rosuvastatin  (CRESTOR ) 20 MG tablet Take 1 tablet (20 mg total) by mouth daily. 90 tablet 3   spironolactone  (ALDACTONE ) 25 MG tablet Take 0.5 tablets (12.5 mg total) by mouth daily. 45 tablet 3   STIOLTO RESPIMAT  2.5-2.5 MCG/ACT AERS USE 2 INHALATIONS ORALLY   EVERY MORNING 12 g 3   No current facility-administered medications for this visit.   Facility-Administered Medications Ordered in Other Visits  Medication Dose Route Frequency Provider Last Rate  Last Admin   technetium tetrofosmin  (TC-MYOVIEW ) injection 31.9 millicurie  31.9 millicurie Intravenous Once PRN Raford Riggs, MD        Physical Findings: [name]. [name].  vitals were not taken for this visit. .  No significant changes. Lungs are clear to auscultation bilaterally. Heart has regular rate and rhythm. No palpable cervical, supraclavicular, or axillary adenopathy. Abdomen soft, non-tender, normal bowel sounds.   Lab Findings: Lab Results  Component Value Date   WBC 6.3 07/28/2024   HGB 13.7 07/28/2024   HCT 41.5 07/28/2024   MCV 101 (H) 07/28/2024   PLT 269 07/28/2024    Radiographic Findings: NM PET Image Restag (PS) Skull Base To Thigh Result Date: 08/23/2024 EXAM: PET AND CT SKULL BASE TO MID THIGH 08/19/2024 06:34:10 PM TECHNIQUE: RADIOPHARMACEUTICAL: 7.03 mCi F-18 FDG Uptake time 60 minutes. PET imaging was acquired from the base of the skull to the mid thighs. Non-contrast enhanced computed tomography was obtained for attenuation correction and anatomic localization. COMPARISON: Chest CT of 08/10/2024 and most recent PET of 08/13/2023. CLINICAL HISTORY: Non-small cell lung cancer (NSCLC), monitor. Eov; 7.03 mCi F18-FDG in LAC by SAW/CD @ 1649; Rad tx x1 year ago for nsclc. Possible new lung nodule.; Pt imaging was delayed due to camera availability / CD FINDINGS: HEAD AND NECK: No metabolically active cervical lymphadenopathy. Bilateral carotid atherosclerosis. CHEST: Corresponding to the area of right suprahilar mediastinal soft tissue fullness is marked hypermetabolism. Example at 2.8 x 2.9 cm and SUV 25.8 on image 63/4. Mild cardiomegaly. Aortic and coronary artery calcification. The anterior right upper lobe/apical consolidation demonstrates diffuse, low-level activity at SUV 3.4 on image 13/7. Left upper lobe mildly hypermetabolic pulmonary nodules were regressive on the prior diagnostic CT, likely resolving  infection . Example at 6 mm and SUV 1.8 on image 28/7. Centrilobular emphysema. ABDOMEN AND PELVIS: No metabolically active intraperitoneal mass. No metabolically active lymphadenopathy. Physiologic activity within the gastrointestinal and genitourinary systems. Normal adrenal glands. Mild renal cortical thinning bilaterally. Bilateral common iliac artery stents. Pelvic floor laxity. Hysterectomy. BONES AND SOFT TISSUE: No abnormal FDG activity localizes to the bones. No metabolically active aggressive osseous lesion. Osteopenia. Right proximal femur fixation. IMPRESSION: 1. Markedly hypermetabolic right paratracheal nodal mass, consistent with metastatic disease. 2. Anterior right upper lobe consolidation with low-level, relatively diffuse activity, favoring evolving radiation change. Reevaluation on chest CT follow-up at 3 months is recommended. 3. Left upper lobe nodularity with low-level activity, likely resolving infection. 4. Incidental findings, including: Aortic atherosclerosis (icd10-i70.0), coronary artery atherosclerosis, and emphysema (icd10-j43.9). Electronically signed by: Rockey Kilts MD 08/23/2024 04:22 PM EDT RP Workstation: [id number]   CT CHEST WO CONTRAST Result Date: 08/11/2024 CLINICAL DATA:  Non-small cell lung cancer (NSCLC), non-metastatic, assess treatment response. * Tracking Code: BO * EXAM:  CT CHEST WITHOUT CONTRAST TECHNIQUE: Multidetector CT imaging of the chest was performed following the standard protocol without IV contrast. RADIATION DOSE REDUCTION: This exam was performed according to the departmental dose-optimization program which includes automated exposure control, adjustment of the mA and/or kV according to patient size and/or use of iterative reconstruction technique. COMPARISON:  CT scan chest from 01/29/2024. FINDINGS: Cardiovascular: Normal cardiac size. No pericardial effusion. No aortic aneurysm. There are coronary artery calcifications, in keeping with coronary  artery disease. There are also moderate peripheral atherosclerotic vascular calcifications of thoracic aorta and its major branches. Mediastinum/Nodes: Visualized thyroid  gland appears grossly unremarkable. No solid / cystic mediastinal masses. The esophagus is nondistended precluding optimal assessment. No axillary lymphadenopathy by size criteria. Evaluation of bilateral hila is limited due to lack on intravenous contrast: however, there is increasing fullness in the right upper hilum/right lower paratracheal region (series 2, image 61), concerning for underlying new lymphadenopathy, which is suspicious for metastasis. However, exact measurement is not possible due to lack of intravenous contrast. No other mediastinal lymphadenopathy by size criteria. Lungs/Pleura: The central tracheo-bronchial tree is patent. Mild upper lobe predominant emphysematous changes. Redemonstration of heterogeneous opacity in the right apex, anteriorly which appears mildly increased in size when compared to the prior exam. For example, when measured in similar fashion, the opacity measures up to 1.8 x 3.0 cm on today's exam and measured up to 1.0 x 2.6 cm on the prior exam. Findings are concerning for local recurrence. Correlate clinically to determine the need for short interval follow-up exam versus PET-CT scan, after treating potential infection to exclude infectious etiology. Redemonstration of linear area of atelectasis/scarring in the lingular segment of left upper lobe, unchanged. There is interval decrease in the previously seen nodularity in the lingular segment (series 7, image 91), minimal residual micro nodules remains in the same location. No new mass or consolidation. No pleural effusion or pneumothorax. There is interval decrease in the irregular nodule in the left upper lobe (series 7, image 66), which currently measures 4 x 8 mm while previously measured up to 7 x 8 mm. There is also interval decrease in the additional  nodules in the left upper lobe (series 7, images 67, 72, 78, etc.) No new or suspicious lung nodule. Upper Abdomen: There are at least 2 hypoattenuating structures in the left kidney, not well evaluated on this unenhanced exam but grossly similar to the prior study and favored to represent cysts. Remaining visualized upper abdominal viscera within normal limits. Musculoskeletal: The visualized soft tissues of the chest wall are grossly unremarkable. No suspicious osseous lesions. There are mild to moderate multilevel degenerative changes in the visualized spine. IMPRESSION: 1. There is mild interval increase in the size of heterogeneous opacity in the right apex, anteriorly, which may be due to evolving posttreatment changes, superimposed infection or local recurrence. Correlate clinically to determine the need for short interval follow-up exam versus PET-CT scan, after treating potential infection to exclude infectious etiology. 2. There is increasing fullness in the right upper hilum/right lower paratracheal region, concerning for underlying new lymphadenopathy, which is suspicious for metastasis. However, exact measurement is not possible due to lack of intravenous contrast. 3. There is interval decrease in the size of previously seen multiple nodules in the left upper lobe favoring resolving infection. No new or suspicious lung nodule. 4. Multiple other nonacute observations, as described above. Aortic Atherosclerosis (ICD10-I70.0) and Emphysema (ICD10-J43.9). Electronically Signed   By: Ree Molt M.D.   On: 08/11/2024 10:01  Impression: Slowly enlarging RUL pulmonary nodule, with features suggestive of non-small cell lung cancer (non-biopsy proven)     The patient is recovering from the effects of radiation.  ***  Plan:  ***   *** minutes of total time was spent for this patient encounter, including preparation, face-to-face counseling with the patient and coordination of care, physical exam,  and documentation of the encounter. ____________________________________  Lynwood CHARM Nasuti, PhD, MD  This document serves as a record of services personally performed by Lynwood Nasuti, MD. It was created on his behalf by Reymundo Cartwright, a trained medical scribe. The creation of this record is based on the scribe's personal observations and the provider's statements to them. This document has been checked and approved by the attending provider.

## 2024-08-25 ENCOUNTER — Ambulatory Visit
Admission: RE | Admit: 2024-08-25 | Discharge: 2024-08-25 | Disposition: A | Discharge status: Home / Self Care | Payer: Self-pay | Source: Ambulatory Visit | Attending: Radiation Oncology | Admitting: Radiation Oncology

## 2024-08-25 VITALS — BP 129/52 | HR 52 | Temp 97.4°F | Resp 20 | Ht 62.0 in | Wt 140.8 lb

## 2024-08-25 DIAGNOSIS — R911 Solitary pulmonary nodule: Secondary | ICD-10-CM | POA: Diagnosis not present

## 2024-08-25 DIAGNOSIS — I700 Atherosclerosis of aorta: Secondary | ICD-10-CM | POA: Diagnosis not present

## 2024-08-25 DIAGNOSIS — Z79899 Other long term (current) drug therapy: Secondary | ICD-10-CM | POA: Diagnosis not present

## 2024-08-25 DIAGNOSIS — I2510 Atherosclerotic heart disease of native coronary artery without angina pectoris: Secondary | ICD-10-CM | POA: Insufficient documentation

## 2024-08-25 DIAGNOSIS — Z7901 Long term (current) use of anticoagulants: Secondary | ICD-10-CM | POA: Diagnosis not present

## 2024-08-25 DIAGNOSIS — Z923 Personal history of irradiation: Secondary | ICD-10-CM | POA: Diagnosis not present

## 2024-08-25 DIAGNOSIS — J439 Emphysema, unspecified: Secondary | ICD-10-CM | POA: Insufficient documentation

## 2024-08-25 DIAGNOSIS — C3411 Malignant neoplasm of upper lobe, right bronchus or lung: Secondary | ICD-10-CM | POA: Diagnosis not present

## 2024-08-25 DIAGNOSIS — Z87891 Personal history of nicotine dependence: Secondary | ICD-10-CM | POA: Diagnosis not present

## 2024-08-25 NOTE — Progress Notes (Signed)
 Holly Benson is here today for follow up post radiation to the lung and to receive recent PET scan results.   Lung Side: Right, patient completed treatment on 09/29/23.   Does the patient complain of any of the following: Pain: No Shortness of breath w/wo exertion: Yes, both  Cough: Yes, productive Hemoptysis: No Pain with swallowing: No Swallowing/choking concerns: No Appetite: Good Energy Level: Some fatigue but nothing out of the ordinary Post radiation skin Changes: No problems

## 2024-08-26 ENCOUNTER — Other Ambulatory Visit: Payer: Self-pay | Admitting: Neurology

## 2024-08-26 ENCOUNTER — Encounter: Payer: Self-pay | Admitting: Internal Medicine

## 2024-08-29 NOTE — Telephone Encounter (Signed)
 Last filled by patient on 05/28/24 Last office visit : 08/16/24 Next office visit : 02/27/25  - please sign medication refill

## 2024-08-30 DIAGNOSIS — H524 Presbyopia: Secondary | ICD-10-CM | POA: Diagnosis not present

## 2024-08-30 DIAGNOSIS — H52223 Regular astigmatism, bilateral: Secondary | ICD-10-CM | POA: Diagnosis not present

## 2024-08-30 DIAGNOSIS — Z961 Presence of intraocular lens: Secondary | ICD-10-CM | POA: Diagnosis not present

## 2024-08-30 DIAGNOSIS — H04123 Dry eye syndrome of bilateral lacrimal glands: Secondary | ICD-10-CM | POA: Diagnosis not present

## 2024-08-30 DIAGNOSIS — H1849 Other corneal degeneration: Secondary | ICD-10-CM | POA: Diagnosis not present

## 2024-08-30 DIAGNOSIS — H353131 Nonexudative age-related macular degeneration, bilateral, early dry stage: Secondary | ICD-10-CM | POA: Diagnosis not present

## 2024-08-30 DIAGNOSIS — H5211 Myopia, right eye: Secondary | ICD-10-CM | POA: Diagnosis not present

## 2024-08-31 DIAGNOSIS — Z87891 Personal history of nicotine dependence: Secondary | ICD-10-CM | POA: Diagnosis not present

## 2024-08-31 DIAGNOSIS — C3411 Malignant neoplasm of upper lobe, right bronchus or lung: Secondary | ICD-10-CM | POA: Diagnosis not present

## 2024-09-01 ENCOUNTER — Ambulatory Visit: Admitting: Radiation Oncology

## 2024-09-02 ENCOUNTER — Ambulatory Visit
Admission: RE | Admit: 2024-09-02 | Discharge: 2024-09-02 | Disposition: A | Discharge status: Home / Self Care | Source: Ambulatory Visit | Attending: Radiation Oncology | Admitting: Radiation Oncology

## 2024-09-02 ENCOUNTER — Other Ambulatory Visit: Payer: Self-pay

## 2024-09-02 DIAGNOSIS — R911 Solitary pulmonary nodule: Secondary | ICD-10-CM

## 2024-09-02 DIAGNOSIS — Z87891 Personal history of nicotine dependence: Secondary | ICD-10-CM | POA: Diagnosis not present

## 2024-09-02 DIAGNOSIS — C3411 Malignant neoplasm of upper lobe, right bronchus or lung: Secondary | ICD-10-CM | POA: Diagnosis not present

## 2024-09-02 MED ORDER — BISOPROLOL FUMARATE 10 MG PO TABS: 10.0000 mg | ORAL_TABLET | Freq: Every day | ORAL | 3 refills | Status: AC

## 2024-09-02 NOTE — Telephone Encounter (Signed)
 This is a [facility] pt. Pharmacy requesting a refill on medication bisoprolol  which Dr. Inocencio had prescribe. Please address

## 2024-09-05 ENCOUNTER — Ambulatory Visit: Admitting: Radiation Oncology

## 2024-09-06 ENCOUNTER — Ambulatory Visit
Admission: RE | Admit: 2024-09-06 | Discharge: 2024-09-06 | Disposition: A | Discharge status: Home / Self Care | Source: Ambulatory Visit | Attending: Radiation Oncology | Admitting: Radiation Oncology

## 2024-09-06 DIAGNOSIS — C3411 Malignant neoplasm of upper lobe, right bronchus or lung: Secondary | ICD-10-CM | POA: Diagnosis not present

## 2024-09-06 DIAGNOSIS — Z510 Encounter for antineoplastic radiation therapy: Secondary | ICD-10-CM | POA: Diagnosis not present

## 2024-09-06 DIAGNOSIS — C771 Secondary and unspecified malignant neoplasm of intrathoracic lymph nodes: Secondary | ICD-10-CM | POA: Diagnosis not present

## 2024-09-06 DIAGNOSIS — Z87891 Personal history of nicotine dependence: Secondary | ICD-10-CM | POA: Diagnosis not present

## 2024-09-06 DIAGNOSIS — R911 Solitary pulmonary nodule: Secondary | ICD-10-CM | POA: Insufficient documentation

## 2024-09-08 ENCOUNTER — Ambulatory Visit: Admitting: Radiation Oncology

## 2024-09-09 ENCOUNTER — Ambulatory Visit

## 2024-09-12 ENCOUNTER — Ambulatory Visit

## 2024-09-12 ENCOUNTER — Other Ambulatory Visit: Payer: Self-pay

## 2024-09-12 DIAGNOSIS — Z87891 Personal history of nicotine dependence: Secondary | ICD-10-CM | POA: Diagnosis not present

## 2024-09-12 DIAGNOSIS — C3411 Malignant neoplasm of upper lobe, right bronchus or lung: Secondary | ICD-10-CM | POA: Diagnosis not present

## 2024-09-12 DIAGNOSIS — C771 Secondary and unspecified malignant neoplasm of intrathoracic lymph nodes: Secondary | ICD-10-CM | POA: Diagnosis not present

## 2024-09-12 DIAGNOSIS — Z510 Encounter for antineoplastic radiation therapy: Secondary | ICD-10-CM | POA: Diagnosis not present

## 2024-09-12 DIAGNOSIS — R911 Solitary pulmonary nodule: Secondary | ICD-10-CM

## 2024-09-12 LAB — RAD ONC ARIA SESSION SUMMARY
Course Elapsed Days: 0
Plan Fractions Treated to Date: 1
Plan Prescribed Dose Per Fraction: 5 Gy
Plan Total Fractions Prescribed: 10
Plan Total Prescribed Dose: 50 Gy
Reference Point Dosage Given to Date: 5 Gy
Reference Point Session Dosage Given: 5 Gy
Session Number: 1

## 2024-09-13 ENCOUNTER — Ambulatory Visit
Admission: RE | Admit: 2024-09-13 | Discharge: 2024-09-13 | Disposition: A | Discharge status: Home / Self Care | Source: Ambulatory Visit | Attending: Radiation Oncology | Admitting: Radiation Oncology

## 2024-09-13 ENCOUNTER — Encounter: Payer: Self-pay | Admitting: Cardiology

## 2024-09-13 ENCOUNTER — Other Ambulatory Visit: Payer: Self-pay

## 2024-09-13 DIAGNOSIS — C771 Secondary and unspecified malignant neoplasm of intrathoracic lymph nodes: Secondary | ICD-10-CM | POA: Diagnosis not present

## 2024-09-13 DIAGNOSIS — Z510 Encounter for antineoplastic radiation therapy: Secondary | ICD-10-CM | POA: Diagnosis not present

## 2024-09-13 DIAGNOSIS — C3411 Malignant neoplasm of upper lobe, right bronchus or lung: Secondary | ICD-10-CM | POA: Diagnosis not present

## 2024-09-13 DIAGNOSIS — Z87891 Personal history of nicotine dependence: Secondary | ICD-10-CM | POA: Diagnosis not present

## 2024-09-13 LAB — RAD ONC ARIA SESSION SUMMARY
Course Elapsed Days: 1
Plan Fractions Treated to Date: 2
Plan Prescribed Dose Per Fraction: 5 Gy
Plan Total Fractions Prescribed: 10
Plan Total Prescribed Dose: 50 Gy
Reference Point Dosage Given to Date: 10 Gy
Reference Point Session Dosage Given: 5 Gy
Session Number: 2

## 2024-09-14 ENCOUNTER — Other Ambulatory Visit: Payer: Self-pay

## 2024-09-14 ENCOUNTER — Ambulatory Visit
Admission: RE | Admit: 2024-09-14 | Discharge: 2024-09-14 | Disposition: A | Discharge status: Home / Self Care | Source: Ambulatory Visit | Attending: Radiation Oncology | Admitting: Radiation Oncology

## 2024-09-14 DIAGNOSIS — C3411 Malignant neoplasm of upper lobe, right bronchus or lung: Secondary | ICD-10-CM | POA: Diagnosis not present

## 2024-09-14 DIAGNOSIS — Z510 Encounter for antineoplastic radiation therapy: Secondary | ICD-10-CM | POA: Diagnosis not present

## 2024-09-14 DIAGNOSIS — Z87891 Personal history of nicotine dependence: Secondary | ICD-10-CM | POA: Diagnosis not present

## 2024-09-14 DIAGNOSIS — C771 Secondary and unspecified malignant neoplasm of intrathoracic lymph nodes: Secondary | ICD-10-CM | POA: Diagnosis not present

## 2024-09-14 LAB — RAD ONC ARIA SESSION SUMMARY
Course Elapsed Days: 2
Plan Fractions Treated to Date: 3
Plan Prescribed Dose Per Fraction: 5 Gy
Plan Total Fractions Prescribed: 10
Plan Total Prescribed Dose: 50 Gy
Reference Point Dosage Given to Date: 15 Gy
Reference Point Session Dosage Given: 5 Gy
Session Number: 3

## 2024-09-15 ENCOUNTER — Ambulatory Visit
Admission: RE | Admit: 2024-09-15 | Discharge: 2024-09-15 | Disposition: A | Discharge status: Home / Self Care | Source: Ambulatory Visit | Attending: Radiation Oncology | Admitting: Radiation Oncology

## 2024-09-15 ENCOUNTER — Other Ambulatory Visit: Payer: Self-pay

## 2024-09-15 DIAGNOSIS — Z87891 Personal history of nicotine dependence: Secondary | ICD-10-CM | POA: Diagnosis not present

## 2024-09-15 DIAGNOSIS — C771 Secondary and unspecified malignant neoplasm of intrathoracic lymph nodes: Secondary | ICD-10-CM | POA: Diagnosis not present

## 2024-09-15 DIAGNOSIS — C3411 Malignant neoplasm of upper lobe, right bronchus or lung: Secondary | ICD-10-CM | POA: Diagnosis not present

## 2024-09-15 DIAGNOSIS — Z510 Encounter for antineoplastic radiation therapy: Secondary | ICD-10-CM | POA: Diagnosis not present

## 2024-09-15 LAB — RAD ONC ARIA SESSION SUMMARY
Course Elapsed Days: 3
Plan Fractions Treated to Date: 4
Plan Prescribed Dose Per Fraction: 5 Gy
Plan Total Fractions Prescribed: 10
Plan Total Prescribed Dose: 50 Gy
Reference Point Dosage Given to Date: 20 Gy
Reference Point Session Dosage Given: 5 Gy
Session Number: 4

## 2024-09-16 ENCOUNTER — Ambulatory Visit
Admission: RE | Admit: 2024-09-16 | Discharge: 2024-09-16 | Disposition: A | Discharge status: Home / Self Care | Source: Ambulatory Visit | Attending: Radiation Oncology | Admitting: Radiation Oncology

## 2024-09-16 ENCOUNTER — Other Ambulatory Visit: Payer: Self-pay

## 2024-09-16 DIAGNOSIS — Z87891 Personal history of nicotine dependence: Secondary | ICD-10-CM | POA: Diagnosis not present

## 2024-09-16 DIAGNOSIS — C3411 Malignant neoplasm of upper lobe, right bronchus or lung: Secondary | ICD-10-CM | POA: Diagnosis not present

## 2024-09-16 DIAGNOSIS — Z510 Encounter for antineoplastic radiation therapy: Secondary | ICD-10-CM | POA: Diagnosis not present

## 2024-09-16 DIAGNOSIS — C771 Secondary and unspecified malignant neoplasm of intrathoracic lymph nodes: Secondary | ICD-10-CM | POA: Diagnosis not present

## 2024-09-16 LAB — RAD ONC ARIA SESSION SUMMARY
Course Elapsed Days: 4
Plan Fractions Treated to Date: 5
Plan Prescribed Dose Per Fraction: 5 Gy
Plan Total Fractions Prescribed: 10
Plan Total Prescribed Dose: 50 Gy
Reference Point Dosage Given to Date: 25 Gy
Reference Point Session Dosage Given: 5 Gy
Session Number: 5

## 2024-09-18 ENCOUNTER — Other Ambulatory Visit: Payer: Self-pay | Admitting: Internal Medicine

## 2024-09-19 ENCOUNTER — Ambulatory Visit
Admission: RE | Admit: 2024-09-19 | Discharge: 2024-09-19 | Disposition: A | Discharge status: Home / Self Care | Source: Ambulatory Visit | Attending: Radiation Oncology | Admitting: Radiation Oncology

## 2024-09-19 ENCOUNTER — Other Ambulatory Visit: Payer: Self-pay

## 2024-09-19 DIAGNOSIS — C771 Secondary and unspecified malignant neoplasm of intrathoracic lymph nodes: Secondary | ICD-10-CM | POA: Diagnosis not present

## 2024-09-19 DIAGNOSIS — Z87891 Personal history of nicotine dependence: Secondary | ICD-10-CM | POA: Diagnosis not present

## 2024-09-19 DIAGNOSIS — C3411 Malignant neoplasm of upper lobe, right bronchus or lung: Secondary | ICD-10-CM | POA: Diagnosis not present

## 2024-09-19 DIAGNOSIS — Z510 Encounter for antineoplastic radiation therapy: Secondary | ICD-10-CM | POA: Diagnosis not present

## 2024-09-19 LAB — RAD ONC ARIA SESSION SUMMARY
Course Elapsed Days: 7
Plan Fractions Treated to Date: 6
Plan Prescribed Dose Per Fraction: 5 Gy
Plan Total Fractions Prescribed: 10
Plan Total Prescribed Dose: 50 Gy
Reference Point Dosage Given to Date: 30 Gy
Reference Point Session Dosage Given: 5 Gy
Session Number: 6

## 2024-09-20 ENCOUNTER — Ambulatory Visit
Admission: RE | Admit: 2024-09-20 | Discharge: 2024-09-20 | Disposition: A | Discharge status: Home / Self Care | Source: Ambulatory Visit | Attending: Radiation Oncology | Admitting: Radiation Oncology

## 2024-09-20 ENCOUNTER — Other Ambulatory Visit: Payer: Self-pay | Admitting: Internal Medicine

## 2024-09-20 ENCOUNTER — Other Ambulatory Visit: Payer: Self-pay

## 2024-09-20 DIAGNOSIS — Z87891 Personal history of nicotine dependence: Secondary | ICD-10-CM | POA: Diagnosis not present

## 2024-09-20 DIAGNOSIS — C771 Secondary and unspecified malignant neoplasm of intrathoracic lymph nodes: Secondary | ICD-10-CM | POA: Diagnosis not present

## 2024-09-20 DIAGNOSIS — Z510 Encounter for antineoplastic radiation therapy: Secondary | ICD-10-CM | POA: Diagnosis not present

## 2024-09-20 DIAGNOSIS — C3411 Malignant neoplasm of upper lobe, right bronchus or lung: Secondary | ICD-10-CM | POA: Diagnosis not present

## 2024-09-20 LAB — RAD ONC ARIA SESSION SUMMARY
Course Elapsed Days: 8
Plan Fractions Treated to Date: 7
Plan Prescribed Dose Per Fraction: 5 Gy
Plan Total Fractions Prescribed: 10
Plan Total Prescribed Dose: 50 Gy
Reference Point Dosage Given to Date: 35 Gy
Reference Point Session Dosage Given: 5 Gy
Session Number: 7

## 2024-09-21 ENCOUNTER — Other Ambulatory Visit: Payer: Self-pay

## 2024-09-21 ENCOUNTER — Ambulatory Visit (HOSPITAL_COMMUNITY)
Admission: RE | Admit: 2024-09-21 | Discharge: 2024-09-21 | Disposition: A | Discharge status: Home / Self Care | Source: Ambulatory Visit | Attending: Physician Assistant | Admitting: Physician Assistant

## 2024-09-21 ENCOUNTER — Ambulatory Visit
Admission: RE | Admit: 2024-09-21 | Discharge: 2024-09-21 | Disposition: A | Discharge status: Home / Self Care | Source: Ambulatory Visit | Attending: Radiation Oncology | Admitting: Radiation Oncology

## 2024-09-21 VITALS — BP 152/64 | HR 67 | Ht 62.0 in | Wt 141.2 lb

## 2024-09-21 DIAGNOSIS — C771 Secondary and unspecified malignant neoplasm of intrathoracic lymph nodes: Secondary | ICD-10-CM | POA: Diagnosis not present

## 2024-09-21 DIAGNOSIS — Z5181 Encounter for therapeutic drug level monitoring: Secondary | ICD-10-CM | POA: Insufficient documentation

## 2024-09-21 DIAGNOSIS — I480 Paroxysmal atrial fibrillation: Secondary | ICD-10-CM | POA: Diagnosis not present

## 2024-09-21 DIAGNOSIS — D6869 Other thrombophilia: Secondary | ICD-10-CM | POA: Insufficient documentation

## 2024-09-21 DIAGNOSIS — Z87891 Personal history of nicotine dependence: Secondary | ICD-10-CM | POA: Diagnosis not present

## 2024-09-21 DIAGNOSIS — Z79899 Other long term (current) drug therapy: Secondary | ICD-10-CM | POA: Diagnosis not present

## 2024-09-21 DIAGNOSIS — I4891 Unspecified atrial fibrillation: Secondary | ICD-10-CM | POA: Insufficient documentation

## 2024-09-21 DIAGNOSIS — I4819 Other persistent atrial fibrillation: Secondary | ICD-10-CM | POA: Diagnosis not present

## 2024-09-21 DIAGNOSIS — Z510 Encounter for antineoplastic radiation therapy: Secondary | ICD-10-CM | POA: Diagnosis not present

## 2024-09-21 DIAGNOSIS — C3411 Malignant neoplasm of upper lobe, right bronchus or lung: Secondary | ICD-10-CM | POA: Diagnosis not present

## 2024-09-21 LAB — RAD ONC ARIA SESSION SUMMARY
Course Elapsed Days: 9
Plan Fractions Treated to Date: 8
Plan Prescribed Dose Per Fraction: 5 Gy
Plan Total Fractions Prescribed: 10
Plan Total Prescribed Dose: 50 Gy
Reference Point Dosage Given to Date: 40 Gy
Reference Point Session Dosage Given: 5 Gy
Session Number: 8

## 2024-09-21 NOTE — Progress Notes (Signed)
 Primary Care Physician: Holly Benson, [name], MD Primary Cardiologist: Dr Mona Primary Electrophysiologist: Dr Inocencio Referring Physician: Dr Inocencio [name] is a 89 y.o. female with a history of HTN, atrial flutter, HLD, OSA, COPD, lung cancer, PAD, and atrial fibrillation who presents for follow up in the [facility]. She was previously on amiodarone  but this was discontinued due to concern about possible lung toxicity. S/p dofetilide  admission 4/18-4/21/23. She was found to be in atrial flutter at her visit on 02/19/23 and was scheduled for DCCV on 5/9 but she arrived in SR and the procedure was cancelled. Patient is on Eliquis  for stroke prevention.   Patient returns for follow up for atrial fibrillation and dofetilide  monitoring. She reports that over the past few months she has noticed more frequent palpitations. They are brief but happen several times per week. There does not appear to be any specific triggers.   Today, she  denies symptoms of chest pain, shortness of breath, orthopnea, PND, lower extremity edema, dizziness, presyncope, syncope, bleeding, or neurologic sequela. The patient is tolerating medications without difficulties and is otherwise without complaint today.    Atrial Fibrillation Risk Factors:  she does have symptoms or diagnosis of sleep apnea. she does not have a history of rheumatic fever.   Atrial Fibrillation Management history:  Previous antiarrhythmic drugs: flecainide , amiodarone , dofetilide    Previous cardioversions: none Previous ablations: none Anticoagulation history: Eliquis    Past Medical History:  Diagnosis Date   Allergy  Childhod   Seasonal   Arrhythmia    Arthritis    Atrial fibrillation (HCC)    Cancer (HCC)    Constipation    chronic   COPD (chronic obstructive pulmonary disease) (HCC)    Emphysema of lung (HCC)    GERD (gastroesophageal reflux disease) ?   Occasional   History  of radiation therapy    Right lung- 09/22/23-09/29/23- Dr. Lynwood Nasuti   Hyperlipidemia    Hypertension    pulmonary   OSA on CPAP    Osteoarthritis    Osteoporosis    Peripheral neuropathy    Pulmonary hypertension (HCC)    RLS (restless legs syndrome)    Sleep apnea     Current Outpatient Medications  Medication Sig Dispense Refill   albuterol  (VENTOLIN  HFA) 108 (90 Base) MCG/ACT inhaler INHALE 2 PUFFS BY MOUTH EVERY 6 HOURS AS NEEDED FOR WHEEZING OR FOR SHORTNESS OF BREATH 18 g 6   apixaban  (ELIQUIS ) 5 MG TABS tablet Take 1 tablet (5 mg total) by mouth 2 (two) times daily. 180 tablet 1   bisoprolol  (ZEBETA ) 10 MG tablet Take 1 tablet (10 mg total) by mouth at bedtime. 90 tablet 3   Cholecalciferol (VITAMIN D3 MAXIMUM STRENGTH) 125 MCG (5000 UT) capsule Take 5,000 Units by mouth daily.     clobetasol cream (TEMOVATE) 0.05 % Apply topically.     clonazePAM  (KLONOPIN ) 0.5 MG tablet TAKE 1/2 TABLET BY MOUTH EVERY NIGHT AT BEDTIME AS NEEDED FOR RSTLESS LEGS 30 tablet 5   dofetilide  (TIKOSYN ) 250 MCG capsule TAKE 1 CAPSULE BY MOUTH 2 TIMES A DAY. 180 capsule 2   furosemide  (LASIX ) 20 MG tablet Take 1 tablet (20 mg total) by mouth 3 (three) times a week AND 2 tablets (40 mg total) 4 (four) times a week. Take 20 mg by mouth in the morning on Mon/Wed/Fri and 40 mg on Sun/Tues/Thurs/Sat.     irbesartan  (AVAPRO ) 150 MG tablet Take 1 tablet (150 mg total)  by mouth daily with breakfast. 90 tablet 3   MAGNESIUM  OXIDE PO Take 1 tablet by mouth daily.     Multiple Vitamins-Minerals (PRESERVISION AREDS 2) CAPS Take 1 capsule by mouth 2 (two) times daily.     rOPINIRole  (REQUIP ) 0.5 MG tablet TAKE 1 TABLET BY MOUTH EVERY MORNING AND TAKE 1 TABLET BY MOUTH AFTER 3PM AND TAKE 1 TABLET AFTER DINNER 8PM 90 tablet 5   rosuvastatin  (CRESTOR ) 20 MG tablet TAKE 1 TABLET BY MOUTH DAILY 90 tablet 3   STIOLTO RESPIMAT  2.5-2.5 MCG/ACT AERS USE 2 INHALATIONS ORALLY   EVERY MORNING 12 g 3   tobramycin (TOBREX) 0.3  % ophthalmic solution Place 1 drop into the right eye. Uses the day before the eye injection and the day of the eye injection     No current facility-administered medications for this encounter.   Facility-Administered Medications Ordered in Other Encounters  Medication Dose Route Frequency Provider Last Rate Last Admin   technetium tetrofosmin  (TC-MYOVIEW ) injection 31.9 millicurie  31.9 millicurie Intravenous Once PRN Raford Riggs, MD        ROS- All systems are reviewed and negative except as per the HPI above.  Physical Exam: Vitals:   09/21/24 0815  Weight: 64 kg  Height: 5' 2 (1.575 m)    GEN: Well nourished, well developed in no acute distress CARDIAC: Regular rate and rhythm, no murmurs, rubs, gallops RESPIRATORY:  Clear to auscultation without rales, wheezing or rhonchi  ABDOMEN: Soft, non-tender, non-distended EXTREMITIES:  No edema; No deformity    Wt Readings from Last 3 Encounters:  09/21/24 64 kg  08/25/24 63.9 kg  08/16/24 64.2 kg    EKG Interpretation Date/Time:  Wednesday September 21 2024 08:25:36 EST Ventricular Rate:  67 PR Interval:  172 QRS Duration:  90 QT Interval:  452 QTC Calculation: 477 R Axis:   0  Text Interpretation: Normal sinus rhythm Premature atrial complexes Normal ECG When compared with ECG of 09-Jun-2024 15:18, No significant change was found Confirmed by Lott Seelbach ([id number]) on 09/21/2024 8:52:02 AM    Epic records are reviewed at length today   Echo 11/21/23  1. Left ventricular ejection fraction, by estimation, is 60 to 65%. The  left ventricle has normal function. The left ventricle has no regional  wall motion abnormalities. Left ventricular diastolic parameters are  indeterminate.   2. Right ventricular systolic function is normal. The right ventricular  size is mildly enlarged. There is severely elevated pulmonary artery  systolic pressure.   3. Left atrial size was severely dilated.   4. Right atrial size was  severely dilated.   5. The mitral valve is normal in structure. Mild mitral valve  regurgitation. No evidence of mitral stenosis.   6. Tricuspid valve regurgitation is moderate.   7. The aortic valve is tricuspid. Aortic valve regurgitation is mild. No  aortic stenosis is present.   8. The inferior vena cava is dilated in size with >50% respiratory  variability, suggesting right atrial pressure of 8 mmHg.    CHA2DS2-VASc Score = 6  The patient's score is based upon: CHF History: 1 HTN History: 1 Diabetes History: 0 Stroke History: 0 Vascular Disease History: 1 Age Score: 2 Gender Score: 1       ASSESSMENT AND PLAN: Persistent Atrial Fibrillation (ICD10:  I48.19) The patient's CHA2DS2-VASc score is 6, indicating a 9.7% annual risk of stroke.   Failed flecainide  and amiodarone  (lung side effects) Loaded on dofetilide  02/2022 Patient in SR today but having  more frequent palpitations. Apple Watch strips personally reviewed which show SR with PACs and rate controlled afib.  We discussed rhythm control options. Not a candidate for ablation with age and comorbidities. Discussed rechallenging amiodarone  but both agreed would like to avoid amiodarone  with COPD and lung cancer. Also discussed increasing bisoprolol . Will continue with present therapy for now.  Continue dofetilide  250 mcg BID Continue Eliquis  5 mg BID Continue bisoprolol  10 mg daily  Secondary Hypercoagulable State (ICD10:  D68.69) The patient is at significant risk for stroke/thromboembolism based upon her CHA2DS2-VASc Score of 6.  Continue Apixaban  (Eliquis ). No bleeding issues.   High Risk Medication Monitoring (ICD 10: [id number]) Patient requires ongoing monitoring for anti-arrhythmic medication which has the potential to cause life threatening arrhythmias. QT interval on ECG acceptable for dofetilide  monitoring. Check bmet/mag today.     HTN Stable on current regimen  OSA  Encouraged nightly CPAP  Chronic  HFpEF EF 60-65% GDMT per primary cardiology team Fluid status appears stable today  CAD CAC score 1999, FFR negative No anginal symptoms Followed by Dr Mona  Lung cancer PET from 08/19/2024 which demonstrate a hypermetabolic right paratracheal nodal mass, consistent with metastatic disease.  Plan per oncology   Follow up in the AF clinic in 6 months.    Daril Kicks PA-C [facility] [address] [place], [place] [address] [phone]

## 2024-09-22 ENCOUNTER — Ambulatory Visit (HOSPITAL_COMMUNITY): Payer: Self-pay | Admitting: Physician Assistant

## 2024-09-22 ENCOUNTER — Other Ambulatory Visit: Payer: Self-pay

## 2024-09-22 ENCOUNTER — Ambulatory Visit
Admission: RE | Admit: 2024-09-22 | Discharge: 2024-09-22 | Disposition: A | Discharge status: Home / Self Care | Source: Ambulatory Visit | Attending: Radiation Oncology | Admitting: Radiation Oncology

## 2024-09-22 DIAGNOSIS — C771 Secondary and unspecified malignant neoplasm of intrathoracic lymph nodes: Secondary | ICD-10-CM | POA: Diagnosis not present

## 2024-09-22 DIAGNOSIS — Z510 Encounter for antineoplastic radiation therapy: Secondary | ICD-10-CM | POA: Diagnosis not present

## 2024-09-22 DIAGNOSIS — C3411 Malignant neoplasm of upper lobe, right bronchus or lung: Secondary | ICD-10-CM | POA: Diagnosis not present

## 2024-09-22 DIAGNOSIS — Z87891 Personal history of nicotine dependence: Secondary | ICD-10-CM | POA: Diagnosis not present

## 2024-09-22 LAB — RAD ONC ARIA SESSION SUMMARY
Course Elapsed Days: 10
Plan Fractions Treated to Date: 9
Plan Prescribed Dose Per Fraction: 5 Gy
Plan Total Fractions Prescribed: 10
Plan Total Prescribed Dose: 50 Gy
Reference Point Dosage Given to Date: 45 Gy
Reference Point Session Dosage Given: 5 Gy
Session Number: 9

## 2024-09-22 LAB — BASIC METABOLIC PANEL WITH GFR
BUN/Creatinine Ratio: 30 — ABNORMAL HIGH (ref 12–28)
BUN: 32 mg/dL — ABNORMAL HIGH (ref 8–27)
CO2: 25 mmol/L (ref 20–29)
Calcium: 9.7 mg/dL (ref 8.7–10.3)
Chloride: 100 mmol/L (ref 96–106)
Creatinine, Ser: 1.08 mg/dL — ABNORMAL HIGH (ref 0.57–1.00)
Glucose: 96 mg/dL (ref 70–99)
Potassium: 4.9 mmol/L (ref 3.5–5.2)
Sodium: 141 mmol/L (ref 134–144)
eGFR: 49 mL/min/1.73 — ABNORMAL LOW (ref >59)

## 2024-09-22 LAB — MAGNESIUM: Magnesium: 2.2 mg/dL (ref 1.6–2.3)

## 2024-09-23 ENCOUNTER — Other Ambulatory Visit: Payer: Self-pay

## 2024-09-23 ENCOUNTER — Ambulatory Visit
Admission: RE | Admit: 2024-09-23 | Discharge: 2024-09-23 | Disposition: A | Discharge status: Home / Self Care | Source: Ambulatory Visit | Attending: Radiation Oncology | Admitting: Radiation Oncology

## 2024-09-23 ENCOUNTER — Ambulatory Visit (HOSPITAL_COMMUNITY): Admitting: Physician Assistant

## 2024-09-23 DIAGNOSIS — C3411 Malignant neoplasm of upper lobe, right bronchus or lung: Secondary | ICD-10-CM | POA: Diagnosis not present

## 2024-09-23 DIAGNOSIS — Z510 Encounter for antineoplastic radiation therapy: Secondary | ICD-10-CM | POA: Diagnosis not present

## 2024-09-23 DIAGNOSIS — C771 Secondary and unspecified malignant neoplasm of intrathoracic lymph nodes: Secondary | ICD-10-CM | POA: Diagnosis not present

## 2024-09-23 DIAGNOSIS — Z87891 Personal history of nicotine dependence: Secondary | ICD-10-CM | POA: Diagnosis not present

## 2024-09-23 LAB — RAD ONC ARIA SESSION SUMMARY
Course Elapsed Days: 11
Plan Fractions Treated to Date: 10
Plan Prescribed Dose Per Fraction: 5 Gy
Plan Total Fractions Prescribed: 10
Plan Total Prescribed Dose: 50 Gy
Reference Point Dosage Given to Date: 50 Gy
Reference Point Session Dosage Given: 5 Gy
Session Number: 10

## 2024-09-25 NOTE — Radiation Completion Notes (Addendum)
°  Radiation Oncology         [phone] ________________________________  Name: Holly Benson MRN: [id number]  Date of Service: 09/23/2024  DOB: 1933-02-28  End of Treatment Note                            RADIATION ONCOLOGY END OF TREATMENT NOTE     Diagnosis: Right paratracheal nodal mass from putative stage I NSCLC Intent: Palliative     ==========DELIVERED PLANS==========  First Treatment Date: 2024-09-12 Last Treatment Date: 2024-09-23   Plan Name: Lung_R Site: Lung, Right Technique: IMRT Mode: Photon Dose Per Fraction: 5 Gy Prescribed Dose (Delivered / Prescribed): 50 Gy / 50 Gy Prescribed Fxs (Delivered / Prescribed): 10 / 10     ====================================   The patient tolerated radiation. She developed fatigue and esophagitis throughout her treatment, but declined need for Carafate.   The patient will return in one month and will continue follow up with Dr. Shelah as well.      Ronita Due, PA-C

## 2024-09-27 ENCOUNTER — Encounter: Payer: Self-pay | Admitting: Allergy & Immunology

## 2024-09-27 ENCOUNTER — Encounter: Payer: Self-pay | Admitting: Radiation Oncology

## 2024-09-27 ENCOUNTER — Ambulatory Visit (INDEPENDENT_AMBULATORY_CARE_PROVIDER_SITE_OTHER): Admitting: Allergy & Immunology

## 2024-09-27 VITALS — BP 124/58 | HR 70 | Temp 97.2°F | Ht 62.0 in | Wt 142.0 lb

## 2024-09-27 DIAGNOSIS — L508 Other urticaria: Secondary | ICD-10-CM | POA: Diagnosis not present

## 2024-09-27 DIAGNOSIS — R7989 Other specified abnormal findings of blood chemistry: Secondary | ICD-10-CM | POA: Diagnosis not present

## 2024-09-27 DIAGNOSIS — J449 Chronic obstructive pulmonary disease, unspecified: Secondary | ICD-10-CM

## 2024-09-27 DIAGNOSIS — R7689 Other specified abnormal immunological findings in serum: Secondary | ICD-10-CM

## 2024-09-27 MED ORDER — FAMOTIDINE 20 MG PO TABS: 20.0000 mg | ORAL_TABLET | Freq: Two times a day (BID) | ORAL | 1 refills | Status: AC

## 2024-09-27 MED ORDER — PREDNISONE 20 MG PO TABS: 20.0000 mg | ORAL_TABLET | Freq: Two times a day (BID) | ORAL | 1 refills | Status: DC

## 2024-09-27 NOTE — Progress Notes (Signed)
 FOLLOW UP  Date of Service/Encounter:  09/27/24   Assessment:   Chronic urticaria - getting labs to look for serious causes of hives    Chronic obstructive pulmonary disease - followed by Dr. Shelah (uses Stiolto as needed)   Atrial fibrillation - on Eliquis    Son-in-law owns [employer]  Plan/Recommendations:   1. Chronic urticaria - with slightly elevated ANA  - I do not think that you have anything concerning on your labs. - Continue to note triggers. - I think using the suppressive antihistamines as needed is an excellent idea.  - In the meantime, start suppressive dosing of antihistamines for 3-5 days as needed:   - Morning:  Zyrtec (cetirizine) 10mg  + Pepcid  (famotidine ) 20mg   - Evening: Zyrtec (cetirizine) 10mg  + Pepcid  (famotidine ) 20mg  - You can change this dosing at home, decreasing the dose as needed or increasing the dosing as needed.  - If you are not tolerating the medications or are tired of taking them every day, we can start treatment with either Dupixent or Xolair (injectable medications that can treat hives/itching).   2. Chronic obstructive pulmonary disease - This seems to be well-managed. - We are not going to make any medications changes.   3. Return in about 6 months (around 03/27/2025). You can have the follow up appointment with Dr. Iva or a Nurse Practicioner (our Nurse Practitioners are excellent and always have Physician oversight!).    Subjective:   Holly Benson is a 89 y.o. female presenting today for follow up of  Chief Complaint  Patient presents with   Urticaria    Has been taking 2 zyrtec  and 2 pepcid , BID.  They have been much less showing.  Currently has a few on her shoulder and her bottom.  Has stopped taking all medication about a week ago.     Holly Benson has a history of the following: Patient Active Problem List   Diagnosis Date Noted   Sleep related hypoxia 03/28/2024   ASV (adaptive  servo-ventilation) use counseling 02/18/2024   Influenza A 12/07/2023   History of CAD (coronary artery disease) 11/21/2023   History of lung cancer 11/21/2023   Chronic diastolic CHF (congestive heart failure) (HCC) 11/21/2023   Pulmonary hypertension (HCC) 11/21/2023   Acute exacerbation of CHF (congestive heart failure) (HCC) 11/21/2023   Acute hypoxemic respiratory failure (HCC) 11/20/2023   Lung nodule 08/31/2023   Osteoporosis 07/22/2022   Persistent atrial fibrillation (HCC) 02/18/2022   Hypercoagulable state due to persistent atrial fibrillation (HCC) 05/17/2021   Abnormal CT of the chest 03/15/2021   Pneumonia 01/18/2021   PVD (peripheral vascular disease) 08/01/2020   Chronic anticoagulation 08/01/2020   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  Discussed the use of AI scribe software for clinical note transcription with the patient and/or guardian, who gave verbal consent to proceed.  Holly Benson is a 89 y.o. female presenting for a follow up visit.  She was last seen in September 2025.  At that time, that we evaluated her for hives.  We obtained a slew of labs and started her on Zyrtec and Pepcid  twice daily.  For her COPD, she was doing well and managed by pulmonology.  Her labs were notable for a slightly elevated ANA.  Her CBC had some values that were slightly out of range but otherwise normal.  Environmental allergy  panel was  positive only to dust mites.  Everything else was normal.  Since last visit, she has done relatively well.   Asthma/Respiratory Symptom History: She remains on the Stiolto. She has a history of lung cancer and recently completed a round of radiation therapy after a PET scan showed a concerning area. She reports difficulty breathing, attributed to her COPD, but she is not using daily medication for it except for Cialto, which she receives through mail order every three months.   Skin Symptom History: She has been experiencing severe itching with four to six intolerable episodes, which improved after taking two Zyrtecs and two Pepcids twice a day for three weeks. The itching episodes reduced to one or two per week and eventually stopped. After her prescriptions ran out last week, she stopped taking the medications and experienced a mild itching episode on her neck yesterday, which resolved by today.  GERD Symptom History: She did not refill her prescriptions as she was unsure if she should continue them. She mentions that the Pepcid  is expensive, and she would prefer a prescription as her insurance covers it. She takes two Pepcids a day and finds Zyrtec affordable as she uses the generic version.  Her daughter has lupus, which is relevant as her ANA marker was elevated, but she has not found any specific triggers for her itching, describing it as 'really random'.  Otherwise, there have been no changes to her past medical history, surgical history, family history, or social history.    Review of systems otherwise negative other than that mentioned in the HPI.    Objective:   Blood pressure (!) 124/58, pulse 70, temperature (!) 97.2 F (36.2 C), height 5' 2 (1.575 m), weight 142 lb (64.4 kg), SpO2 94%. Body mass index is 25.97 kg/m.    Physical Exam Vitals reviewed.  Constitutional:      Appearance: She is well-developed.  HENT:     Head: Normocephalic and atraumatic.     Right Ear:  Tympanic membrane, ear canal and external ear normal. No drainage, swelling or tenderness. Tympanic membrane is not injected, scarred, erythematous, retracted or bulging.     Left Ear: Tympanic membrane, ear canal and external ear normal. No drainage, swelling or tenderness. Tympanic membrane is not injected, scarred, erythematous, retracted or bulging.     Nose: No nasal deformity, septal deviation, mucosal edema or rhinorrhea.     Right Turbinates: Enlarged, swollen and pale.     Left Turbinates: Enlarged, swollen and pale.     Right Sinus: No maxillary sinus tenderness or frontal sinus tenderness.     Left Sinus: No maxillary sinus tenderness or frontal sinus tenderness.     Mouth/Throat:     Lips: Pink.     Mouth: Mucous membranes are moist. Mucous membranes are not pale and not dry.     Pharynx: Uvula midline.  Eyes:     General: Lids are normal. Allergic shiner present.        Right eye: No discharge.        Left eye: No discharge.     Conjunctiva/sclera: Conjunctivae normal.  Right eye: Right conjunctiva is not injected. No chemosis.    Left eye: Left conjunctiva is not injected. No chemosis.    Pupils: Pupils are equal, round, and reactive to light.  Cardiovascular:     Rate and Rhythm: Normal rate and regular rhythm.     Heart sounds: Normal heart sounds.  Pulmonary:     Effort: Pulmonary effort is normal. No tachypnea, accessory muscle usage or respiratory distress.     Breath sounds: Normal breath sounds. No wheezing, rhonchi or rales.  Chest:     Chest wall: No tenderness.  Abdominal:     Tenderness: There is no abdominal tenderness. There is no guarding or rebound.  Lymphadenopathy:     Head:     Right side of head: No submandibular, tonsillar or occipital adenopathy.     Left side of head: No submandibular, tonsillar or occipital adenopathy.     Cervical: No cervical adenopathy.  Skin:    General: Skin is warm.     Capillary Refill: Capillary refill takes less  than 2 seconds.     Coloration: Skin is not pale.     Findings: No abrasion, erythema, petechiae or rash. Rash is not papular, urticarial or vesicular.     Comments: No urticaria appreciated.   Neurological:     Mental Status: She is alert.  Psychiatric:        Behavior: Behavior is cooperative.      Diagnostic studies: none       Marty Shaggy, MD  [facility] 

## 2024-09-27 NOTE — Patient Instructions (Addendum)
 1. Chronic urticaria - with slightly elevated ANA  - I do not think that you have anything concerning on your labs. - Continue to note triggers. - I think using the suppressive antihistamines as needed is an excellent idea.  - In the meantime, start suppressive dosing of antihistamines for 3-5 days as needed:   - Morning:  Zyrtec (cetirizine) 10mg  + Pepcid  (famotidine ) 20mg   - Evening: Zyrtec (cetirizine) 10mg  + Pepcid  (famotidine ) 20mg  - You can change this dosing at home, decreasing the dose as needed or increasing the dosing as needed.  - If you are not tolerating the medications or are tired of taking them every day, we can start treatment with either Dupixent or Xolair (injectable medications that can treat hives/itching).   2. Chronic obstructive pulmonary disease - This seems to be well-managed. - We are not going to make any medications changes.   3. Return in about 6 months (around 03/27/2025). You can have the follow up appointment with Dr. Iva or a Nurse Practicioner (our Nurse Practitioners are excellent and always have Physician oversight!).    Please inform us  of any Emergency Department visits, hospitalizations, or changes in symptoms. Call us  before going to the ED for breathing or allergy  symptoms since we might be able to fit you in for a sick visit. Feel free to contact us  anytime with any questions, problems, or concerns.  It was a pleasure to meet you today!  Websites that have reliable patient information: 1. American Academy of Asthma, Allergy , and Immunology: www.aaaai.org 2. Food Allergy  Research and Education (FARE): foodallergy.org 3. Mothers of Asthmatics: http://www.asthmacommunitynetwork.org 4. [employer] of Allergy , Asthma, and Immunology: www.acaai.org      "Like" us  on Facebook and Instagram for our latest updates!      A healthy democracy works best when [employer] participate! Make sure you are registered to vote! If you have moved or  changed any of your contact information, you will need to get this updated before voting! Scan the QR codes below to learn more!

## 2024-10-03 ENCOUNTER — Telehealth: Payer: Self-pay | Admitting: Radiology

## 2024-10-03 NOTE — Telephone Encounter (Signed)
 [name] reports that her esophagitis is improving and she is watching what [she] is eating. She declined Carafate prescription, but will call if she changes her mind. We will see her on 10/31/2024 for follow-up. She was encouraged to call with any questions or concerns in the meantime.     Leeroy Due, PA-C

## 2024-10-06 DIAGNOSIS — Z85828 Personal history of other malignant neoplasm of skin: Secondary | ICD-10-CM | POA: Diagnosis not present

## 2024-10-06 DIAGNOSIS — L821 Other seborrheic keratosis: Secondary | ICD-10-CM | POA: Diagnosis not present

## 2024-10-06 DIAGNOSIS — L570 Actinic keratosis: Secondary | ICD-10-CM | POA: Diagnosis not present

## 2024-10-07 ENCOUNTER — Ambulatory Visit

## 2024-10-10 DIAGNOSIS — H353114 Nonexudative age-related macular degeneration, right eye, advanced atrophic with subfoveal involvement: Secondary | ICD-10-CM | POA: Diagnosis not present

## 2024-10-10 DIAGNOSIS — H43813 Vitreous degeneration, bilateral: Secondary | ICD-10-CM | POA: Diagnosis not present

## 2024-10-10 DIAGNOSIS — H353122 Nonexudative age-related macular degeneration, left eye, intermediate dry stage: Secondary | ICD-10-CM | POA: Diagnosis not present

## 2024-10-12 ENCOUNTER — Encounter: Payer: Self-pay | Admitting: Internal Medicine

## 2024-10-12 ENCOUNTER — Ambulatory Visit (HOSPITAL_COMMUNITY): Admitting: Physician Assistant

## 2024-10-12 ENCOUNTER — Ambulatory Visit: Admitting: Internal Medicine

## 2024-10-12 ENCOUNTER — Ambulatory Visit

## 2024-10-12 VITALS — BP 118/60 | HR 66 | Temp 97.6°F | Ht 62.0 in | Wt 142.9 lb

## 2024-10-12 DIAGNOSIS — Z0000 Encounter for general adult medical examination without abnormal findings: Secondary | ICD-10-CM

## 2024-10-12 DIAGNOSIS — E782 Mixed hyperlipidemia: Secondary | ICD-10-CM

## 2024-10-12 DIAGNOSIS — I10 Essential (primary) hypertension: Secondary | ICD-10-CM

## 2024-10-12 DIAGNOSIS — I429 Cardiomyopathy, unspecified: Secondary | ICD-10-CM

## 2024-10-12 NOTE — Progress Notes (Signed)
 Established Patient Office Visit     CC/Reason for Visit: Subsequent Medicare wellness visit  HPI: Holly Benson is a 89 y.o. female who is coming in today for the above mentioned reasons. Past Medical History is significant for: A-fib, non-small cell lung cancer, COPD, hypertension, OSA on CPAP, heart failure with preserved ejection fraction and pulmonary hypertension.  Feeling well, no acute concerns or complaints.  Due for COVID-vaccine.  Elects to defer cancer screening due to age.   Past Medical/Surgical History: Past Medical History:  Diagnosis Date   Allergy  Childhod   Seasonal   Arrhythmia    Arthritis    Atrial fibrillation (HCC)    Cancer (HCC)    Constipation    chronic   COPD (chronic obstructive pulmonary disease) (HCC)    Emphysema of lung (HCC)    GERD (gastroesophageal reflux disease) ?   Occasional   History of radiation therapy    Right lung- 09/22/23-09/29/23- Dr. Lynwood Nasuti   Hyperlipidemia    Hypertension    pulmonary   OSA on CPAP    Osteoarthritis    Osteoporosis    Peripheral neuropathy    Pulmonary hypertension (HCC)    RLS (restless legs syndrome)    Sleep apnea     Past Surgical History:  Procedure Laterality Date   ABDOMINAL AORTOGRAM W/LOWER EXTREMITY N/A 03/21/2020   Procedure: ABDOMINAL AORTOGRAM W/ Bilateral LOWER EXTREMITY [place];  Surgeon: Darron Deatrice LABOR, MD;  Location: [facility];  Service: Cardiovascular;  Laterality: N/A;   ABDOMINAL HYSTERECTOMY Bilateral    arm surgery Right    Broken arm and has a plate in it   CARDIOVERSION N/A 03/12/2023   Procedure: CARDIOVERSION;  Surgeon: Shlomo Wilbert SAUNDERS, MD;  Location: [facility];  Service: Cardiovascular;  Laterality: N/A;   CATARACT EXTRACTION Bilateral    COLONOSCOPY     More than 10 years ago In [facility]   EYE SURGERY  1996(?)   Cataract removal   FRACTURE SURGERY  2014   Proximal humerus - plate inserted   HERNIA REPAIR  08/2022   KNEE  ARTHROSCOPY Right    PERIPHERAL VASCULAR INTERVENTION Bilateral 03/21/2020   Procedure: PERIPHERAL VASCULAR INTERVENTION;  Surgeon: Darron Deatrice LABOR, MD;  Location: [facility];  Service: Cardiovascular;  Laterality: Bilateral;  external iliac   TONSILLECTOMY     ventral herniorrhaphy  2023   mesh used    Social History:  reports that she quit smoking about 32 years ago. Her smoking use included cigarettes. She started smoking about 69 years ago. She has a 55.5 pack-year smoking history. She has never used smokeless tobacco. She reports current alcohol  use of about 7.0 standard drinks of alcohol  per week. She reports that she does not use drugs.  Allergies: Allergies  Allergen Reactions   Lovenox [Enoxaparin Sodium] Hives, Rash and Other (See Comments)    PT STATES SHE BROKE OUT IN A RASH HEAD TO TOE AND LASTED ABOUT 3 WEEKS    Metoprolol  Succinate [Metoprolol ] Other (See Comments)    Muscle aches, hand pain, and tingling    Family History:  Family History  Problem Relation Age of Onset   Alcohol  abuse Mother    Suicidality Father    Alcohol  abuse Father    Early death Father    Stroke Maternal Grandfather    Hypertension Sister    Colon cancer Neg Hx    Esophageal cancer Neg Hx    Pancreatic cancer  Neg Hx    Stomach cancer Neg Hx    Liver disease Neg Hx      Current Outpatient Medications:    albuterol  (VENTOLIN  HFA) 108 (90 Base) MCG/ACT inhaler, INHALE 2 PUFFS BY MOUTH EVERY 6 HOURS AS NEEDED FOR WHEEZING OR FOR SHORTNESS OF BREATH, Disp: 18 g, Rfl: 6   apixaban  (ELIQUIS ) 5 MG TABS tablet, Take 1 tablet (5 mg total) by mouth 2 (two) times daily., Disp: 180 tablet, Rfl: 1   bisoprolol  (ZEBETA ) 10 MG tablet, Take 1 tablet (10 mg total) by mouth at bedtime., Disp: 90 tablet, Rfl: 3   Cholecalciferol (VITAMIN D3 MAXIMUM STRENGTH) 125 MCG (5000 UT) capsule, Take 5,000 Units by mouth daily., Disp: , Rfl:    clonazePAM  (KLONOPIN ) 0.5 MG tablet, TAKE 1/2 TABLET BY MOUTH  EVERY NIGHT AT BEDTIME AS NEEDED FOR RSTLESS LEGS, Disp: 30 tablet, Rfl: 5   dofetilide  (TIKOSYN ) 250 MCG capsule, TAKE 1 CAPSULE BY MOUTH 2 TIMES A DAY., Disp: 180 capsule, Rfl: 2   famotidine  (PEPCID ) 20 MG tablet, Take 1 tablet (20 mg total) by mouth 2 (two) times daily., Disp: 180 tablet, Rfl: 1   furosemide  (LASIX ) 20 MG tablet, Take 1 tablet (20 mg total) by mouth 3 (three) times a week AND 2 tablets (40 mg total) 4 (four) times a week. Take 20 mg by mouth in the morning on Mon/Wed/Fri and 40 mg on Sun/Tues/Thurs/Sat., Disp: , Rfl:    irbesartan  (AVAPRO ) 150 MG tablet, Take 1 tablet (150 mg total) by mouth daily with breakfast., Disp: 90 tablet, Rfl: 3   MAGNESIUM  OXIDE PO, Take 1 tablet by mouth daily., Disp: , Rfl:    Multiple Vitamins-Minerals (PRESERVISION AREDS 2) CAPS, Take 1 capsule by mouth 2 (two) times daily., Disp: , Rfl:    rOPINIRole  (REQUIP ) 0.5 MG tablet, TAKE 1 TABLET BY MOUTH EVERY MORNING AND TAKE 1 TABLET BY MOUTH AFTER 3PM AND TAKE 1 TABLET AFTER DINNER 8PM, Disp: 90 tablet, Rfl: 5   rosuvastatin  (CRESTOR ) 20 MG tablet, TAKE 1 TABLET BY MOUTH DAILY, Disp: 90 tablet, Rfl: 3   STIOLTO RESPIMAT  2.5-2.5 MCG/ACT AERS, USE 2 INHALATIONS ORALLY   EVERY MORNING, Disp: 12 g, Rfl: 3   tobramycin (TOBREX) 0.3 % ophthalmic solution, Place 1 drop into the right eye. Uses the day before the eye injection and the day of the eye injection, Disp: , Rfl:    clobetasol cream (TEMOVATE) 0.05 %, Apply topically. (Patient not taking: Reported on 10/12/2024), Disp: , Rfl:  No current facility-administered medications for this visit.  Facility-Administered Medications Ordered in Other Visits:    technetium tetrofosmin  (TC-MYOVIEW ) injection 31.9 millicurie, 31.9 millicurie, Intravenous, Once PRN, Raford Riggs, MD  Review of Systems:  Negative unless indicated in HPI.   Physical Exam: Vitals:   10/12/24 1025  BP: 118/60  Pulse: 66  Temp: 97.6 F (36.4 C)  SpO2: 94%  Weight: 142 lb  14.4 oz (64.8 kg)  Height: 5' 2 (1.575 m)    Body mass index is 26.14 kg/m.   Subsequent Medicare wellness visit   Visit info / Clinical Intake: Medicare Wellness Visit Type:: Subsequent Annual Wellness Visit Persons participating in visit and providing information:: patient Medicare Wellness Visit Mode:: In-person (required for WTM) Interpreter Needed?: No Pre-visit prep was completed: no AWV questionnaire completed by patient prior to visit?: no Date:: 10/03/24 Living arrangements:: with family/others Patient's Overall Health Status Rating: (!) fair Typical amount of pain: none Does pain affect daily life?: no Are you  currently prescribed opioids?: no  Dietary Habits and Nutritional Risks How many meals a day?: 3 Eats fruit and vegetables daily?: yes Most meals are obtained by: eating out; having others provide food; preparing own meals In the last 2 weeks, have you had any of the following?: none Diabetic:: no  Functional Status Activities of Daily Living (to include ambulation/medication): Independent Ambulation: Independent with device- listed below Home Assistive Devices/Equipment: Cane Medication Administration: Independent Home Management (perform basic housework or laundry): Independent Manage your own finances?: yes Primary transportation is: driving Concerns about vision?: (!) yes Concerns about hearing?: no Uses hearing aids?: (!) yes Hear whispered voice?: (!) no *in-person visit only*  Fall Screening Falls in the past year?: 0 Number of falls in past year: 0 Was there an injury with Fall?: 0 Fall Risk Category Calculator: 0 Patient Fall Risk Level: Low Fall Risk  Fall Risk Patient at Risk for Falls Due to: History of fall(s) Fall risk Follow up: Falls evaluation completed  Home and Transportation Safety: All rugs have non-skid backing?: yes All stairs or steps have railings?: yes Grab bars in the bathtub or shower?: yes Have non-skid surface  in bathtub or shower?: yes Good home lighting?: yes Regular seat belt use?: yes Hospital stays in the last year:: (!) yes How many hospital stays:: 2 Reason: Plumonary and Flu  Cognitive Assessment Difficulty concentrating, remembering, or making decisions? : no Will 6CIT or Mini Cog be Completed: yes What year is it?: 0 points What month is it?: 0 points Give patient an address phrase to remember (5 components): The cow jumped over the moon About what time is it?: 0 points Count backwards from 20 to 1: 0 points Say the months of the year in reverse: 0 points Repeat the address phrase from earlier: 0 points 6 CIT Score: 0 points  Advance Directives (For Healthcare) Does Patient Have a Medical Advance Directive?: Yes Does patient want to make changes to medical advance directive?: No - Patient declined Type of Advance Directive: Healthcare Power of [place]; Living will Copy of Healthcare Power of Attorney in Chart?: No - copy requested Copy of Living Will in Chart?: No - copy requested  Reviewed/Updated  Reviewed/Updated: Reviewed All (Medical, Surgical, Family, Medications, Allergies, Care Teams, Patient Goals)    Vision Screening   Right eye Left eye Both eyes  Without correction 20/30 20/30 20/30   With correction         Depression/mood:  Flowsheet Row Office Visit from 01/06/2024 in [facility] [place]  PHQ-9 Total Score 2        Counseling: Counseling given: Not Answered Tobacco comments: Former smoker 03/14/22     Lab orders based on risk factors: Laboratory update will be reviewed     Screening: Patient provided with a written and personalized 5-10 year screening schedule in the AVS. Health Maintenance  Topic Date Due   COVID-19 Vaccine (6 - 2025-26 season) 07/04/2024   Medicare Annual Wellness Visit  10/12/2025   DTaP/Tdap/Td vaccine (3 - Td or Tdap) 05/27/2033   Pneumococcal Vaccine for age over 84  Completed   Flu Shot   Completed   Osteoporosis screening with Bone Density Scan  Completed   Zoster (Shingles) Vaccine  Completed   Meningitis B Vaccine  Aged Out     Provider List Update: Patient Care Team    Relationship Specialty Notifications Start End  [name], [name], MD PCP - General Internal Medicine  05/14/23   Mona Vinie BROCKS,  MD PCP - Cardiology Cardiology Admissions 11/10/17   Inocencio Soyla Lunger, MD PCP - Electrophysiology Cardiology  08/01/20        I have personally reviewed and noted the following in the patients chart:   Medical and social history Use of alcohol , tobacco or illicit drugs  Current medications and supplements Functional ability and status Nutritional status Physical activity Advanced directives List of other physicians Hospitalizations, surgeries, and ER visits in previous 12 months Vitals Screenings to include cognitive, depression, and falls Referrals and appointments  In addition, I have reviewed and discussed with patient certain preventive protocols, quality metrics, and best practice recommendations. A written personalized care plan for preventive services as well as general preventive health recommendations were provided to patient.   Impression and Plan:  Medicare annual wellness visit, subsequent     -Recommend routine eye and dental care. -Healthy lifestyle discussed in detail. -Labs to be updated today. -Prostate cancer screening: Not applicable Health Maintenance  Topic Date Due   COVID-19 Vaccine (6 - 2025-26 season) 07/04/2024   Medicare Annual Wellness Visit  10/12/2025   DTaP/Tdap/Td vaccine (3 - Td or Tdap) 05/27/2033   Pneumococcal Vaccine for age over 25  Completed   Flu Shot  Completed   Osteoporosis screening with Bone Density Scan  Completed   Zoster (Shingles) Vaccine  Completed   Meningitis B Vaccine  Aged Out   -Advised to obtain COVID vaccination at pharmacy.     Tully [name], MD [facility]

## 2024-10-13 ENCOUNTER — Other Ambulatory Visit: Payer: Self-pay | Admitting: Cardiovascular Disease

## 2024-10-13 DIAGNOSIS — I480 Paroxysmal atrial fibrillation: Secondary | ICD-10-CM

## 2024-10-13 NOTE — Telephone Encounter (Signed)
 Prescription refill request for Eliquis  received. Indication:afib  Last office visit: [place] 09/21/2024 Scr:  1.08, 09/21/2024 Age: 89 yo  Weight: 64.8 kg   Refill sent.

## 2024-10-25 ENCOUNTER — Encounter: Payer: Self-pay | Admitting: Radiation Oncology

## 2024-10-28 ENCOUNTER — Encounter: Payer: Self-pay | Admitting: Radiation Oncology

## 2024-10-31 ENCOUNTER — Ambulatory Visit
Admission: RE | Admit: 2024-10-31 | Discharge: 2024-10-31 | Disposition: A | Discharge status: Home / Self Care | Source: Ambulatory Visit | Attending: Radiation Oncology | Admitting: Radiation Oncology

## 2024-10-31 ENCOUNTER — Encounter: Payer: Self-pay | Admitting: Radiation Oncology

## 2024-10-31 VITALS — BP 109/56 | HR 67 | Temp 97.3°F | Resp 18 | Ht 62.0 in | Wt 142.0 lb

## 2024-10-31 DIAGNOSIS — Z7901 Long term (current) use of anticoagulants: Secondary | ICD-10-CM | POA: Diagnosis not present

## 2024-10-31 DIAGNOSIS — Z79899 Other long term (current) drug therapy: Secondary | ICD-10-CM | POA: Insufficient documentation

## 2024-10-31 DIAGNOSIS — R911 Solitary pulmonary nodule: Secondary | ICD-10-CM | POA: Diagnosis present

## 2024-10-31 DIAGNOSIS — Z923 Personal history of irradiation: Secondary | ICD-10-CM | POA: Insufficient documentation

## 2024-10-31 NOTE — Progress Notes (Signed)
 "  Radiation Oncology         [phone] ________________________________  Name: Holly Benson MRN: [id number]  Date: 10/31/2024  DOB: [birth year]  Follow-Up Visit Note  CC: [name], [name], MD  [name], MD    ICD-10-CM   1. Lung nodule  R91.1 BUN & Creatinine [facility])    CT Chest W Contrast      Diagnosis: Right paratracheal nodal mass from putative stage I NSCLC -- Initially presented with a slowly enlarging RUL pulmonary nodule, with features suggestive of non-small cell lung cancer (non-biopsy proven); s/p SBRT completed on 09/29/2023. Later developed a hypermetabolic right paratracheal nodal mass, consistent with metastatic disease in October of 2025  Interval Since Last Radiation: 1 month and 8 days   2) Intent: Palliative  Radiation Treatment Dates: First Treatment Date: 2024-09-12 -- Last Treatment Date: 2024-09-23 Site/Dose/Technique/Mode:  Plan Name: Lung_R Site: Lung, Right Technique: IMRT Mode: Photon Dose Per Fraction: 5 Gy Prescribed Dose (Delivered / Prescribed): 50 Gy / 50 Gy Prescribed Fxs (Delivered / Prescribed): 10 / 10  1)  Intent: Curative       Radiation treatment dates:  First Treatment Date: 2023-09-22 - Last Treatment Date: 2023-09-29 Site/Dose/Technique/Mode:  Site: Lung, Right Technique: SBRT/SRT-IMRT Mode: Photon Dose Per Fraction: 18 Gy Prescribed Dose (Delivered / Prescribed): 54 Gy / 54 Gy Prescribed Fxs (Delivered / Prescribed): 3 / 3  Narrative:  The patient returns today for a routine follow-up visit 1 month after completing radiation therapy. She tolerated radiation therapy relatively well overall other than some fatigue and esophagitis throughout her treatment course. She was offered Carafate for management of her esophageal symptoms but declined this offer.   No other significant interval history since the patient completed radiation therapy, or in the interval since she began radiation therapy in  mid-November.   On evaluation today the patient reports contracting a upper respiratory illness along with several family members.  She has some residual fatigue concerning this issue.  She denies any changes in her breathing significant cough or hemoptysis.  She denies any residual swallowing issues.                              Allergies:  is allergic to lovenox [enoxaparin sodium] and metoprolol  succinate [metoprolol ].  Meds: Current Outpatient Medications  Medication Sig Dispense Refill   albuterol  (VENTOLIN  HFA) 108 (90 Base) MCG/ACT inhaler INHALE 2 PUFFS BY MOUTH EVERY 6 HOURS AS NEEDED FOR WHEEZING OR FOR SHORTNESS OF BREATH 18 g 6   bisoprolol  (ZEBETA ) 10 MG tablet Take 1 tablet (10 mg total) by mouth at bedtime. 90 tablet 3   Cholecalciferol (VITAMIN D3 MAXIMUM STRENGTH) 125 MCG (5000 UT) capsule Take 5,000 Units by mouth daily.     clobetasol cream (TEMOVATE) 0.05 % Apply topically. (Patient not taking: Reported on 10/12/2024)     clonazePAM  (KLONOPIN ) 0.5 MG tablet TAKE 1/2 TABLET BY MOUTH EVERY NIGHT AT BEDTIME AS NEEDED FOR RSTLESS LEGS 30 tablet 5   dofetilide  (TIKOSYN ) 250 MCG capsule TAKE 1 CAPSULE BY MOUTH 2 TIMES A DAY. 180 capsule 2   ELIQUIS  5 MG TABS tablet TAKE 1 TABLET TWICE A DAY 180 tablet 1   famotidine  (PEPCID ) 20 MG tablet Take 1 tablet (20 mg total) by mouth 2 (two) times daily. 180 tablet 1   furosemide  (LASIX ) 20 MG tablet Take 1 tablet (20 mg total) by mouth 3 (three) times a week  AND 2 tablets (40 mg total) 4 (four) times a week. Take 20 mg by mouth in the morning on Mon/Wed/Fri and 40 mg on Sun/Tues/Thurs/Sat.     irbesartan  (AVAPRO ) 150 MG tablet Take 1 tablet (150 mg total) by mouth daily with breakfast. 90 tablet 3   MAGNESIUM  OXIDE PO Take 1 tablet by mouth daily.     Multiple Vitamins-Minerals (PRESERVISION AREDS 2) CAPS Take 1 capsule by mouth 2 (two) times daily.     rOPINIRole  (REQUIP ) 0.5 MG tablet TAKE 1 TABLET BY MOUTH EVERY MORNING AND TAKE 1 TABLET  BY MOUTH AFTER 3PM AND TAKE 1 TABLET AFTER DINNER 8PM 90 tablet 5   rosuvastatin  (CRESTOR ) 20 MG tablet TAKE 1 TABLET BY MOUTH DAILY 90 tablet 3   STIOLTO RESPIMAT  2.5-2.5 MCG/ACT AERS USE 2 INHALATIONS ORALLY   EVERY MORNING 12 g 3   tobramycin (TOBREX) 0.3 % ophthalmic solution Place 1 drop into the right eye. Uses the day before the eye injection and the day of the eye injection     No current facility-administered medications for this encounter.   Facility-Administered Medications Ordered in Other Encounters  Medication Dose Route Frequency Provider Last Rate Last Admin   technetium tetrofosmin  (TC-MYOVIEW ) injection 31.9 millicurie  31.9 millicurie Intravenous Once PRN Raford Riggs, MD        Physical Findings: [name]. [name].  height is 5' 2 (1.575 m) and weight is 142 lb (64.4 kg). Her temporal temperature is 97.3 F (36.3 C) (abnormal). Her blood pressure is 109/56 (abnormal) and her pulse is 67. Her respiration is 18 and oxygen  saturation is 95%. .   Lungs are clear to auscultation bilaterally. Heart has regular rate and rhythm. No palpable cervical, supraclavicular, or axillary adenopathy. Abdomen soft, non-tender, normal bowel sounds.   Lab Findings: Lab Results  Component Value Date   WBC 6.3 07/28/2024   HGB 13.7 07/28/2024   HCT 41.5 07/28/2024   MCV 101 (H) 07/28/2024   PLT 269 07/28/2024    Radiographic Findings: No results found.  Impression: Right paratracheal nodal mass from putative stage I NSCLC -- Initially presented with a slowly enlarging RUL pulmonary nodule, with features suggestive of non-small cell lung cancer (non-biopsy proven); s/p SBRT completed on 09/29/2023. Later developed a hypermetabolic right paratracheal nodal mass, consistent with metastatic disease in October of 2025  No evidence of recurrence on clinical exam today.  She has recovered well from her radiation therapy.  Plan: She will  return for routine follow-up in 3 months.  Prior to this follow-up appointment the patient will have a CT scan of the chest.  This will be performed with IV contrast since her recurrence was within a nodal station,  if her kidney function allows for IV contrast.   25 minutes of total time was spent for this patient encounter, including preparation, face-to-face counseling with the patient and coordination of care, physical exam, and documentation of the encounter. ____________________________________  Lynwood CHARM Nasuti, PhD, MD  This document serves as a record of services personally performed by Lynwood Nasuti, MD. It was created on his behalf by Dorthy Fuse, a trained medical scribe. The creation of this record is based on the scribe's personal observations and the provider's statements to them. This document has been checked and approved by the attending provider.  "

## 2024-10-31 NOTE — Progress Notes (Signed)
 Holly Benson is here today for follow up post radiation to the lung.  Lung Side: Right, patient completed treatment on 09/23/24.   Does the patient complain of any of the following: Pain: Yes reports pain to right shoulder blade at times. Patient states hurts when touched.   Shortness of breath w/wo exertion: Yes, mostly on exertion.  Cough: Yes. Productive at times. At times she has difficulty coughing up mucous.  Hemoptysis: No Pain with swallowing: No Swallowing/choking concerns: No Appetite: Poor, due to patient having a recent illness.  Energy Level:  Low  Post radiation skin Changes: No     Additional comments if applicable:   BP (!) 109/56 (BP Location: Left Arm, Patient Position: Sitting)   Pulse 67   Temp (!) 97.3 F (36.3 C) (Temporal)   Resp 18   Ht 5' 2 (1.575 m)   Wt 142 lb (64.4 kg)   SpO2 95%   BMI 25.97 kg/m

## 2024-11-09 ENCOUNTER — Ambulatory Visit: Admitting: Emergency Medicine

## 2024-11-16 ENCOUNTER — Encounter: Payer: Self-pay | Admitting: Internal Medicine

## 2024-11-16 ENCOUNTER — Ambulatory Visit: Admitting: Internal Medicine

## 2024-11-16 VITALS — BP 130/70 | HR 63 | Temp 97.9°F | Wt 141.5 lb

## 2024-11-16 DIAGNOSIS — R052 Subacute cough: Secondary | ICD-10-CM

## 2024-11-16 MED ORDER — BENZONATATE 200 MG PO CAPS: 200.0000 mg | ORAL_CAPSULE | Freq: Two times a day (BID) | ORAL | 0 refills | Status: AC | PRN

## 2024-11-16 NOTE — Progress Notes (Signed)
 "    Established Patient Office Visit     CC/Reason for Visit: Cough  HPI: Holly Benson is a 89 y.o. female who is coming in today for the above mentioned reasons. Past Medical History is significant for: Metastatic lung cancer, COPD, chronic diastolic heart failure.  She just completed radiation.  For the last month she has been experiencing a significant dry cough and some shortness of breath.  O2 sats in office today are 97% on room air.   Past Medical/Surgical History: Past Medical History:  Diagnosis Date   Allergy  Childhod   Seasonal   Arrhythmia    Arthritis    Atrial fibrillation (HCC)    Cancer (HCC)    Constipation    chronic   COPD (chronic obstructive pulmonary disease) (HCC)    Emphysema of lung (HCC)    GERD (gastroesophageal reflux disease) ?   Occasional   History of radiation therapy    Right lung- 09/22/23-09/29/23- Dr. Lynwood Nasuti   History of radiation therapy    Right lung- 09/12/24-09/23/24- Dr. Lynwood Nasuti   Hyperlipidemia    Hypertension    pulmonary   OSA on CPAP    Osteoarthritis    Osteoporosis    Peripheral neuropathy    Pulmonary hypertension (HCC)    RLS (restless legs syndrome)    Sleep apnea     Past Surgical History:  Procedure Laterality Date   ABDOMINAL AORTOGRAM W/LOWER EXTREMITY N/A 03/21/2020   Procedure: ABDOMINAL AORTOGRAM W/ Bilateral LOWER EXTREMITY [place];  Surgeon: Darron Deatrice LABOR, MD;  Location: [facility];  Service: Cardiovascular;  Laterality: N/A;   ABDOMINAL HYSTERECTOMY Bilateral    arm surgery Right    Broken arm and has a plate in it   CARDIOVERSION N/A 03/12/2023   Procedure: CARDIOVERSION;  Surgeon: Shlomo Wilbert SAUNDERS, MD;  Location: [facility];  Service: Cardiovascular;  Laterality: N/A;   CATARACT EXTRACTION Bilateral    COLONOSCOPY     More than 10 years ago In [facility]   EYE SURGERY  1996(?)   Cataract removal   FRACTURE SURGERY  2014   Proximal humerus - plate inserted    HERNIA REPAIR  08/2022   KNEE ARTHROSCOPY Right    PERIPHERAL VASCULAR INTERVENTION Bilateral 03/21/2020   Procedure: PERIPHERAL VASCULAR INTERVENTION;  Surgeon: Darron Deatrice LABOR, MD;  Location: [facility];  Service: Cardiovascular;  Laterality: Bilateral;  external iliac   TONSILLECTOMY     ventral herniorrhaphy  2023   mesh used    Social History:  reports that she quit smoking about 33 years ago. Her smoking use included cigarettes. She started smoking about 70 years ago. She has a 55.5 pack-year smoking history. She has never used smokeless tobacco. She reports current alcohol  use of about 7.0 standard drinks of alcohol  per week. She reports that she does not use drugs.  Allergies: Allergies[1]  Family History:  Family History  Problem Relation Age of Onset   Alcohol  abuse Mother    Suicidality Father    Alcohol  abuse Father    Early death Father    Stroke Maternal Grandfather    Hypertension Sister    Colon cancer Neg Hx    Esophageal cancer Neg Hx    Pancreatic cancer Neg Hx    Stomach cancer Neg Hx    Liver disease Neg Hx     Current Medications[2]  Review of Systems:  Negative unless indicated in HPI.   Physical Exam: Vitals:  11/16/24 0856  BP: 130/70  Pulse: 63  Temp: 97.9 F (36.6 C)  TempSrc: Oral  SpO2: 97%  Weight: 141 lb 8 oz (64.2 kg)    Body mass index is 25.88 kg/m.   Physical Exam Vitals reviewed.  Constitutional:      Appearance: Normal appearance.  HENT:     Head: Normocephalic and atraumatic.  Eyes:     Conjunctiva/sclera: Conjunctivae normal.  Cardiovascular:     Rate and Rhythm: Normal rate and regular rhythm.  Pulmonary:     Effort: Pulmonary effort is normal.     Breath sounds: Normal breath sounds.  Skin:    General: Skin is warm and dry.  Neurological:     General: No focal deficit present.     Mental Status: She is alert and oriented to person, place, and time.  Psychiatric:        Mood and Affect: Mood  normal.        Behavior: Behavior normal.        Thought Content: Thought content normal.        Judgment: Judgment normal.      Impression and Plan:  Subacute cough -     Benzonatate ; Take 1 capsule (200 mg total) by mouth 2 (two) times daily as needed for cough.  Dispense: 20 capsule; Refill: 0  -Suspect might be related to recent radiation plus minus baseline lung condition.  Will give Tessalon  Perles.  Have asked her to schedule follow-up with her pulmonologist.  She does not appear to have pneumonia based on lung auscultation.   Time spent:22 minutes reviewing chart, interviewing and examining patient and formulating plan of care.     Tully [name], MD [facility]     [1]  Allergies Allergen Reactions   Lovenox [Enoxaparin Sodium] Hives, Rash and Other (See Comments)    PT STATES SHE BROKE OUT IN A RASH HEAD TO TOE AND LASTED ABOUT 3 WEEKS    Metoprolol  Succinate [Metoprolol ] Other (See Comments)    Muscle aches, hand pain, and tingling  [2]  Current Outpatient Medications:    albuterol  (VENTOLIN  HFA) 108 (90 Base) MCG/ACT inhaler, INHALE 2 PUFFS BY MOUTH EVERY 6 HOURS AS NEEDED FOR WHEEZING OR FOR SHORTNESS OF BREATH, Disp: 18 g, Rfl: 6   benzonatate  (TESSALON ) 200 MG capsule, Take 1 capsule (200 mg total) by mouth 2 (two) times daily as needed for cough., Disp: 20 capsule, Rfl: 0   bisoprolol  (ZEBETA ) 10 MG tablet, Take 1 tablet (10 mg total) by mouth at bedtime., Disp: 90 tablet, Rfl: 3   Cholecalciferol (VITAMIN D3 MAXIMUM STRENGTH) 125 MCG (5000 UT) capsule, Take 5,000 Units by mouth daily., Disp: , Rfl:    clonazePAM  (KLONOPIN ) 0.5 MG tablet, TAKE 1/2 TABLET BY MOUTH EVERY NIGHT AT BEDTIME AS NEEDED FOR RSTLESS LEGS, Disp: 30 tablet, Rfl: 5   dofetilide  (TIKOSYN ) 250 MCG capsule, TAKE 1 CAPSULE BY MOUTH 2 TIMES A DAY., Disp: 180 capsule, Rfl: 2   ELIQUIS  5 MG TABS tablet, TAKE 1 TABLET TWICE A DAY, Disp: 180 tablet, Rfl: 1    famotidine  (PEPCID ) 20 MG tablet, Take 1 tablet (20 mg total) by mouth 2 (two) times daily., Disp: 180 tablet, Rfl: 1   furosemide  (LASIX ) 20 MG tablet, Take 1 tablet (20 mg total) by mouth 3 (three) times a week AND 2 tablets (40 mg total) 4 (four) times a week. Take 20 mg by mouth in the morning on Mon/Wed/Fri and 40  mg on Sun/Tues/Thurs/Sat., Disp: , Rfl:    irbesartan  (AVAPRO ) 150 MG tablet, Take 1 tablet (150 mg total) by mouth daily with breakfast., Disp: 90 tablet, Rfl: 3   MAGNESIUM  OXIDE PO, Take 1 tablet by mouth daily., Disp: , Rfl:    Multiple Vitamins-Minerals (PRESERVISION AREDS 2) CAPS, Take 1 capsule by mouth 2 (two) times daily., Disp: , Rfl:    rOPINIRole  (REQUIP ) 0.5 MG tablet, TAKE 1 TABLET BY MOUTH EVERY MORNING AND TAKE 1 TABLET BY MOUTH AFTER 3PM AND TAKE 1 TABLET AFTER DINNER 8PM, Disp: 90 tablet, Rfl: 5   rosuvastatin  (CRESTOR ) 20 MG tablet, TAKE 1 TABLET BY MOUTH DAILY, Disp: 90 tablet, Rfl: 3   STIOLTO RESPIMAT  2.5-2.5 MCG/ACT AERS, USE 2 INHALATIONS ORALLY   EVERY MORNING, Disp: 12 g, Rfl: 3   tobramycin (TOBREX) 0.3 % ophthalmic solution, Place 1 drop into the right eye. Uses the day before the eye injection and the day of the eye injection, Disp: , Rfl:    clobetasol cream (TEMOVATE) 0.05 %, Apply topically. (Patient not taking: Reported on 11/16/2024), Disp: , Rfl:  No current facility-administered medications for this visit.  Facility-Administered Medications Ordered in Other Visits:    technetium tetrofosmin  (TC-MYOVIEW ) injection 31.9 millicurie, 31.9 millicurie, Intravenous, Once PRN, Raford Riggs, MD  "

## 2024-12-01 ENCOUNTER — Encounter: Payer: Self-pay | Admitting: Emergency Medicine

## 2024-12-01 ENCOUNTER — Ambulatory Visit

## 2024-12-01 ENCOUNTER — Ambulatory Visit: Admitting: Emergency Medicine

## 2024-12-01 VITALS — BP 112/60 | HR 71 | Temp 97.6°F | Ht 62.0 in | Wt 142.1 lb

## 2024-12-01 DIAGNOSIS — C3490 Malignant neoplasm of unspecified part of unspecified bronchus or lung: Secondary | ICD-10-CM | POA: Diagnosis not present

## 2024-12-01 DIAGNOSIS — R053 Chronic cough: Secondary | ICD-10-CM

## 2024-12-01 DIAGNOSIS — R0602 Shortness of breath: Secondary | ICD-10-CM

## 2024-12-01 DIAGNOSIS — J449 Chronic obstructive pulmonary disease, unspecified: Secondary | ICD-10-CM

## 2024-12-01 DIAGNOSIS — J439 Emphysema, unspecified: Secondary | ICD-10-CM

## 2024-12-01 DIAGNOSIS — R059 Cough, unspecified: Secondary | ICD-10-CM

## 2024-12-01 DIAGNOSIS — Z85118 Personal history of other malignant neoplasm of bronchus and lung: Secondary | ICD-10-CM

## 2024-12-01 MED ORDER — AEROCHAMBER MV MISC: 2 refills | Status: AC

## 2024-12-01 MED ORDER — DOXYCYCLINE HYCLATE 100 MG PO TABS: 100.0000 mg | ORAL_TABLET | Freq: Two times a day (BID) | ORAL | 0 refills | Status: AC

## 2024-12-01 NOTE — Assessment & Plan Note (Signed)
 Progressive dyspnea since mid December.  She has also had chronic cough.  Consider URI/pneumonia.  Also must consider possible sequela from her radiation therapy.  I am going to check a chest x-ray today.  We will also perform walking oximetry to ensure that she is not evolving exertional hypoxemia following radiation treatment.  Continue to treat her COPD, OSA/OHS.

## 2024-12-01 NOTE — Progress Notes (Signed)
 "  Subjective:    Patient ID: Holly Benson, female    DOB: 01-04-1933, 89 y.o.   MRN: [id number]  COPD She complains of cough and shortness of breath. There is no wheezing. Pertinent negatives include no ear pain, fever, headaches, postnasal drip, rhinorrhea, sneezing, sore throat or trouble swallowing. Her past medical history is significant for COPD.  Shortness of Breath Pertinent negatives include no ear pain, fever, headaches, leg swelling, rash, rhinorrhea, sore throat, vomiting or wheezing. Her past medical history is significant for COPD.   ROV 12/01/2024 --pleasant 89 year old woman with a history of chronic respiratory failure, previously on servo ventilator but now on BiPAP 17/12, PS 4.  She has severe COPD, OSA/OHS, A-fib, GERD, chronic cough and chronic rhinitis.  She underwent SBRT empirically for a slowly enlarging right upper lobe pulmonary nodule (not biopsied).  We have continued to follow other nodules specifically multiple left upper lobe nodules with interval CT scans.  Her scan and then subsequent PET scan in October 2025 showed evidence for right perihilar recurrence.  She has undergone radiation therapy to this area x10, ended in late November, with a repeat CT scan planned for March. She has been managed on Stiolto, uses albuterol  approximately 2x a day with some benefit.  She is off zyrtec, uses saline nasal spray. Rare GERD sx She has had more exertional dyspnea since her radiation - has evolved gradually. Has dyspnea with most exertion. She has also had cough for about 6 weeks - has been worse since she had a URI in late December. The URI is better - still a lot of PND and drainage. She has not seen any desaturations.   BiPAP compliance download between 11/01/2024 and 11/30/2024 confirms 97% usage for greater than 4 hours, IPAP 17, EPAP 12, PS 4  CT chest 08/10/2024 reviewed by me showed some evolving heterogeneous opacity at the right apex consistent with posttreatment  changes or inflammatory process.  There is also increasing fullness in the upper right hilum concerning for possible new lymphadenopathy.  Her left upper lobe nodularity has subsided, has decreased in size.  PET scan done 08/19/2024 reviewed by me showed no cervical adenopathy, marked hypermetabolism in the right suprahilar mediastinum measuring 2.8 x 2.9 cm.  There is low-level hypermetabolism in the anterior right upper lobe apical consolidation.  The left upper lobe area of nodularity has regressed.   Review of Systems  Constitutional:  Negative for fever and unexpected weight change.  HENT:  Negative for congestion, dental problem, ear pain, nosebleeds, postnasal drip, rhinorrhea, sinus pressure, sneezing, sore throat and trouble swallowing.   Eyes:  Negative for redness and itching.  Respiratory:  Positive for cough and shortness of breath. Negative for chest tightness and wheezing.   Cardiovascular:  Negative for palpitations and leg swelling.  Gastrointestinal:  Negative for nausea and vomiting.  Genitourinary:  Negative for dysuria.  Musculoskeletal:  Negative for joint swelling.  Skin:  Negative for rash.  Neurological:  Negative for headaches.  Hematological:  Does not bruise/bleed easily.  Psychiatric/Behavioral:  Negative for dysphoric mood. The patient is not nervous/anxious.        Objective:   Physical Exam Vitals:   12/01/24 1552  BP: 112/60  Pulse: 71  Temp: 97.6 F (36.4 C)  SpO2: 94%  Weight: 142 lb 2 oz (64.5 kg)  Height: 5' 2 (1.575 m)   Gen: Pleasant, thin woman, in no distress,  normal affect, mild kyphosis  ENT: No lesions,  mouth clear,  oropharynx clear, no postnasal drip  Neck: No JVD, no stridor  Lungs: No use of accessory muscles, very soft bilateral expiratory wheezes.  Overall good air movement  Cardiovascular: RRR, heart sounds normal, no murmur or gallops, no peripheral edema  Musculoskeletal: joints of hands swollen, nodular  Neuro:  alert, non focal  Skin: Warm, no lesions or rashes      Assessment & Plan:  History of lung cancer Empiric SBRT to the right upper lobe for presumed non-small cell lung cancer now with apparent right hilar recurrence on CT chest and PET scan.  She underwent 10 treatments of radiation therapy to this area, completed in late November.  Repeat CT scan of the chest is planned for March.  Shortness of breath Progressive dyspnea since mid December.  She has also had chronic cough.  Consider URI/pneumonia.  Also must consider possible sequela from her radiation therapy.  I am going to check a chest x-ray today.  We will also perform walking oximetry to ensure that she is not evolving exertional hypoxemia following radiation treatment.  Continue to treat her COPD, OSA/OHS.  Cough As above could be related to changes from her radiation.  Consider impact of chronic rhinitis since she is not currently on an allergy  regimen.  This exacerbated by URI in late December.  Plan to treat her empirically for possible bronchitis with doxycycline .  Hopefully she will improve with more targeted treatment for her chronic rhinitis.  Plan to restart her Zyrtec and consider starting fluticasone  nasal spray if she continues to have problems.  Chest x-ray will be performed today to look for radiation changes  COPD (chronic obstructive pulmonary disease) (HCC) Plan to continue her Stiolto, albuterol  as needed.  She uses her nebulizer about twice a day.  She does have an albuterol  HFA but difficulty using it because it is hard to squeeze.  I think it might be easier for her to perform the maneuver if we get her a spacer so we will work on this.    I personally spent a total of 45 minutes in the care of the patient today including preparing to see the patient, getting/reviewing separately obtained history, performing a medically appropriate exam/evaluation, counseling and educating, placing orders, documenting clinical  information in the EHR, independently interpreting results, and communicating results.     Lamar Chris, MD, PhD 12/01/2024, 4:28 PM [facility] [phone] or if no answer [phone]  "

## 2024-12-01 NOTE — Patient Instructions (Addendum)
 We confirmed good compliance and good clinical benefit with your BiPAP at night.  Keep up the good work and wear the mask reliably while sleeping. Please continue Stiolto 2 puffs once daily. Keep your albuterol  available to use 1 nebulizer treatment if you needed for shortness of breath, chest tightness, wheezing. Keep your albuterol  inhaler available to use 2 puffs if you need for shortness of breath, chest tightness, wheezing.  We will give you a spacer to use with this to make administration easier. Please restart your Zyrtec once daily. If you continue to have nasal congestion and drainage please message our office so we can send a prescription for fluticasone  nasal spray (Flonase ). We will perform a chest x-ray today. Please take doxycycline  100 mg twice a day for 7 days. We will perform a walking oximetry test today to ensure that you do not qualify for supplemental oxygen  Get your repeat CT scan of the chest in March and follow with Dr. Shannon as planned Follow with Dr Shelah in 3 months or sooner if you have any problems.

## 2024-12-01 NOTE — Assessment & Plan Note (Signed)
 Empiric SBRT to the right upper lobe for presumed non-small cell lung cancer now with apparent right hilar recurrence on CT chest and PET scan.  She underwent 10 treatments of radiation therapy to this area, completed in late November.  Repeat CT scan of the chest is planned for March.

## 2024-12-01 NOTE — Assessment & Plan Note (Addendum)
 As above could be related to changes from her radiation.  Consider impact of chronic rhinitis since she is not currently on an allergy  regimen.  This exacerbated by URI in late December.  Plan to treat her empirically for possible bronchitis with doxycycline .  Hopefully she will improve with more targeted treatment for her chronic rhinitis.  Plan to restart her Zyrtec and consider starting fluticasone  nasal spray if she continues to have problems.  Chest x-ray will be performed today to look for radiation changes

## 2024-12-01 NOTE — Assessment & Plan Note (Signed)
 Plan to continue her Stiolto, albuterol  as needed.  She uses her nebulizer about twice a day.  She does have an albuterol  HFA but difficulty using it because it is hard to squeeze.  I think it might be easier for her to perform the maneuver if we get her a spacer so we will work on this.

## 2024-12-02 ENCOUNTER — Encounter (HOSPITAL_COMMUNITY): Payer: Self-pay

## 2025-02-02 ENCOUNTER — Ambulatory Visit: Admitting: Radiation Oncology

## 2025-02-27 ENCOUNTER — Ambulatory Visit: Admitting: Neurology

## 2025-03-01 ENCOUNTER — Ambulatory Visit: Admitting: Emergency Medicine

## 2025-03-15 ENCOUNTER — Ambulatory Visit (HOSPITAL_COMMUNITY): Admitting: Physician Assistant
